# Patient Record
Sex: Female | Born: 1975 | Race: Black or African American | Marital: Married | Smoking: Never smoker
Health system: Northeastern US, Academic
[De-identification: ages and names within clinical notes are randomized; demographics above are authoritative.]

## PROBLEM LIST (undated history)

## (undated) DIAGNOSIS — T7840XA Allergy, unspecified, initial encounter: Secondary | ICD-10-CM

## (undated) DIAGNOSIS — F32A Depression, unspecified: Secondary | ICD-10-CM

## (undated) DIAGNOSIS — I319 Disease of pericardium, unspecified: Secondary | ICD-10-CM

## (undated) DIAGNOSIS — F329 Major depressive disorder, single episode, unspecified: Secondary | ICD-10-CM

## (undated) DIAGNOSIS — R011 Cardiac murmur, unspecified: Secondary | ICD-10-CM

## (undated) DIAGNOSIS — I517 Cardiomegaly: Secondary | ICD-10-CM

## (undated) DIAGNOSIS — B999 Unspecified infectious disease: Secondary | ICD-10-CM

## (undated) DIAGNOSIS — I10 Essential (primary) hypertension: Secondary | ICD-10-CM

## (undated) DIAGNOSIS — D649 Anemia, unspecified: Secondary | ICD-10-CM

## (undated) DIAGNOSIS — O24419 Gestational diabetes mellitus in pregnancy, unspecified control: Secondary | ICD-10-CM

## (undated) DIAGNOSIS — I509 Heart failure, unspecified: Secondary | ICD-10-CM

## (undated) DIAGNOSIS — D219 Benign neoplasm of connective and other soft tissue, unspecified: Secondary | ICD-10-CM

## (undated) DIAGNOSIS — R0600 Dyspnea, unspecified: Secondary | ICD-10-CM

## (undated) DIAGNOSIS — J849 Interstitial pulmonary disease, unspecified: Secondary | ICD-10-CM

## (undated) DIAGNOSIS — M329 Systemic lupus erythematosus, unspecified: Secondary | ICD-10-CM

## (undated) DIAGNOSIS — R87619 Unspecified abnormal cytological findings in specimens from cervix uteri: Secondary | ICD-10-CM

## (undated) DIAGNOSIS — I4891 Unspecified atrial fibrillation: Secondary | ICD-10-CM

## (undated) HISTORY — DX: Disease of pericardium, unspecified: I31.9

## (undated) HISTORY — DX: Interstitial pulmonary disease, unspecified: J84.9

## (undated) HISTORY — DX: Cardiomegaly: I51.7

## (undated) HISTORY — PX: THERAPEUTIC ABORTION: SHX798

## (undated) HISTORY — DX: Systemic lupus erythematosus, unspecified: M32.9

## (undated) HISTORY — PX: CARDIAC CATHETERIZATION: SHX172

## (undated) HISTORY — DX: Major depressive disorder, single episode, unspecified: F32.9

## (undated) HISTORY — DX: Anemia, unspecified: D64.9

## (undated) HISTORY — DX: Unspecified abnormal cytological findings in specimens from cervix uteri: R87.619

## (undated) HISTORY — DX: Essential (primary) hypertension: I10

## (undated) HISTORY — DX: Depression, unspecified: F32.A

## (undated) HISTORY — DX: Allergy, unspecified, initial encounter: T78.40XA

---

## 1997-08-13 DIAGNOSIS — IMO0002 Reserved for concepts with insufficient information to code with codable children: Secondary | ICD-10-CM

## 1997-08-13 DIAGNOSIS — M329 Systemic lupus erythematosus, unspecified: Secondary | ICD-10-CM

## 1997-08-13 HISTORY — DX: Systemic lupus erythematosus, unspecified: M32.9

## 1997-08-13 HISTORY — DX: Reserved for concepts with insufficient information to code with codable children: IMO0002

## 1998-05-27 ENCOUNTER — Inpatient Hospital Stay (HOSPITAL_COMMUNITY): Admission: RE | Admit: 1998-05-27 | Discharge: 1998-05-27 | Payer: Self-pay | Admitting: Obstetrics and Gynecology

## 1998-06-01 ENCOUNTER — Ambulatory Visit (HOSPITAL_COMMUNITY): Admission: RE | Admit: 1998-06-01 | Discharge: 1998-06-01 | Payer: Self-pay | Admitting: Obstetrics & Gynecology

## 1998-06-01 ENCOUNTER — Encounter: Admission: RE | Admit: 1998-06-01 | Discharge: 1998-08-30 | Payer: Self-pay | Admitting: Obstetrics & Gynecology

## 1998-06-01 ENCOUNTER — Encounter: Admission: RE | Admit: 1998-06-01 | Discharge: 1998-06-01 | Payer: Self-pay | Admitting: Obstetrics & Gynecology

## 1998-06-03 ENCOUNTER — Inpatient Hospital Stay (HOSPITAL_COMMUNITY): Admission: AD | Admit: 1998-06-03 | Discharge: 1998-06-03 | Payer: Self-pay | Admitting: *Deleted

## 1998-06-06 ENCOUNTER — Encounter (HOSPITAL_COMMUNITY): Admission: RE | Admit: 1998-06-06 | Discharge: 1998-06-09 | Payer: Self-pay | Admitting: Obstetrics & Gynecology

## 1998-06-08 ENCOUNTER — Encounter: Admission: RE | Admit: 1998-06-08 | Discharge: 1998-06-08 | Payer: Self-pay | Admitting: Obstetrics & Gynecology

## 1998-06-08 ENCOUNTER — Inpatient Hospital Stay (HOSPITAL_COMMUNITY): Admission: AD | Admit: 1998-06-08 | Discharge: 1998-06-11 | Payer: Self-pay | Admitting: Obstetrics

## 1999-02-17 ENCOUNTER — Encounter: Payer: Self-pay | Admitting: Cardiology

## 1999-02-17 ENCOUNTER — Other Ambulatory Visit: Payer: Self-pay | Admitting: Cardiology

## 2001-08-04 ENCOUNTER — Other Ambulatory Visit: Payer: Self-pay | Admitting: Cardiology

## 2004-01-26 DIAGNOSIS — D509 Iron deficiency anemia, unspecified: Secondary | ICD-10-CM | POA: Insufficient documentation

## 2004-01-26 DIAGNOSIS — M329 Systemic lupus erythematosus, unspecified: Secondary | ICD-10-CM | POA: Insufficient documentation

## 2004-01-26 DIAGNOSIS — I10 Essential (primary) hypertension: Secondary | ICD-10-CM | POA: Insufficient documentation

## 2007-01-07 ENCOUNTER — Emergency Department (HOSPITAL_COMMUNITY): Admission: EM | Admit: 2007-01-07 | Discharge: 2007-01-08 | Payer: Self-pay | Admitting: Emergency Medicine

## 2007-05-12 ENCOUNTER — Emergency Department (HOSPITAL_COMMUNITY): Admission: EM | Admit: 2007-05-12 | Discharge: 2007-05-12 | Payer: Self-pay | Admitting: Family Medicine

## 2007-11-19 ENCOUNTER — Encounter: Admission: RE | Admit: 2007-11-19 | Discharge: 2007-11-19 | Payer: Self-pay | Admitting: Family Medicine

## 2008-02-09 ENCOUNTER — Emergency Department (HOSPITAL_COMMUNITY): Admission: EM | Admit: 2008-02-09 | Discharge: 2008-02-09 | Payer: Self-pay | Admitting: Emergency Medicine

## 2008-03-08 ENCOUNTER — Ambulatory Visit (HOSPITAL_COMMUNITY): Admission: RE | Admit: 2008-03-08 | Discharge: 2008-03-08 | Payer: Self-pay | Admitting: Urology

## 2008-03-25 ENCOUNTER — Emergency Department (HOSPITAL_COMMUNITY): Admission: EM | Admit: 2008-03-25 | Discharge: 2008-03-25 | Payer: Self-pay | Admitting: Family Medicine

## 2008-04-09 ENCOUNTER — Encounter: Admission: RE | Admit: 2008-04-09 | Discharge: 2008-04-23 | Payer: Self-pay | Admitting: Neurosurgery

## 2008-05-31 ENCOUNTER — Emergency Department (HOSPITAL_COMMUNITY): Admission: EM | Admit: 2008-05-31 | Discharge: 2008-05-31 | Payer: Self-pay | Admitting: Family Medicine

## 2008-06-30 ENCOUNTER — Other Ambulatory Visit: Payer: Self-pay | Admitting: Family Medicine

## 2008-06-30 ENCOUNTER — Emergency Department (HOSPITAL_COMMUNITY): Admission: EM | Admit: 2008-06-30 | Discharge: 2008-06-30 | Payer: Self-pay | Admitting: Emergency Medicine

## 2008-08-18 ENCOUNTER — Observation Stay (HOSPITAL_COMMUNITY): Admission: EM | Admit: 2008-08-18 | Discharge: 2008-08-19 | Payer: Self-pay | Admitting: Emergency Medicine

## 2008-08-19 ENCOUNTER — Encounter (INDEPENDENT_AMBULATORY_CARE_PROVIDER_SITE_OTHER): Payer: Self-pay | Admitting: Emergency Medicine

## 2008-09-08 ENCOUNTER — Inpatient Hospital Stay (HOSPITAL_COMMUNITY): Admission: EM | Admit: 2008-09-08 | Discharge: 2008-09-10 | Payer: Self-pay | Admitting: Emergency Medicine

## 2008-10-14 ENCOUNTER — Emergency Department (HOSPITAL_COMMUNITY): Admission: EM | Admit: 2008-10-14 | Discharge: 2008-10-14 | Payer: Self-pay | Admitting: Family Medicine

## 2008-12-11 ENCOUNTER — Inpatient Hospital Stay (HOSPITAL_COMMUNITY): Admission: AD | Admit: 2008-12-11 | Discharge: 2008-12-11 | Payer: Self-pay | Admitting: Family Medicine

## 2008-12-22 ENCOUNTER — Observation Stay (HOSPITAL_COMMUNITY): Admission: AD | Admit: 2008-12-22 | Discharge: 2008-12-22 | Payer: Self-pay | Admitting: Family Medicine

## 2008-12-22 ENCOUNTER — Ambulatory Visit: Payer: Self-pay | Admitting: Family Medicine

## 2008-12-22 ENCOUNTER — Ambulatory Visit: Payer: Self-pay | Admitting: Obstetrics & Gynecology

## 2008-12-22 ENCOUNTER — Encounter: Payer: Self-pay | Admitting: Physician Assistant

## 2008-12-24 ENCOUNTER — Encounter (INDEPENDENT_AMBULATORY_CARE_PROVIDER_SITE_OTHER): Payer: Self-pay | Admitting: Family Medicine

## 2008-12-24 LAB — CONVERTED CEMR LAB
Creatinine 24 HR UR: 2204 mg/24hr — ABNORMAL HIGH (ref 700–1800)
Creatinine, Urine: 119.1 mg/dL
Protein, Ur: 56 mg/24hr (ref 50–100)

## 2009-01-06 ENCOUNTER — Encounter: Payer: Self-pay | Admitting: Physician Assistant

## 2009-01-06 ENCOUNTER — Encounter: Payer: Self-pay | Admitting: Obstetrics & Gynecology

## 2009-01-06 ENCOUNTER — Ambulatory Visit: Payer: Self-pay | Admitting: Obstetrics & Gynecology

## 2009-01-06 LAB — CONVERTED CEMR LAB
Antibody Screen: NEGATIVE
Basophils Absolute: 0 10*3/uL (ref 0.0–0.1)
Basophils Relative: 0 % (ref 0–1)
Eosinophils Absolute: 0.2 10*3/uL (ref 0.0–0.7)
Eosinophils Relative: 4 % (ref 0–5)
HCT: 31.6 % — ABNORMAL LOW (ref 36.0–46.0)
Hemoglobin: 10.3 g/dL — ABNORMAL LOW (ref 12.0–15.0)
Hepatitis B Surface Ag: NEGATIVE
Hgb A2 Quant: 3.7 % — ABNORMAL HIGH (ref 2.2–3.2)
Hgb A: 96.3 % — ABNORMAL LOW (ref 96.8–97.8)
Hgb F Quant: 0 % (ref 0.0–2.0)
Hgb S Quant: 0 % (ref 0.0–0.0)
Lymphocytes Relative: 20 % (ref 12–46)
Lymphs Abs: 1 10*3/uL (ref 0.7–4.0)
MCHC: 32.6 g/dL (ref 30.0–36.0)
MCV: 68.5 fL — ABNORMAL LOW (ref 78.0–100.0)
Monocytes Absolute: 0.5 10*3/uL (ref 0.1–1.0)
Monocytes Relative: 10 % (ref 3–12)
Neutro Abs: 3.5 10*3/uL (ref 1.7–7.7)
Neutrophils Relative %: 66 % (ref 43–77)
Platelets: 365 10*3/uL (ref 150–400)
RBC: 4.61 M/uL (ref 3.87–5.11)
RDW: 21.4 % — ABNORMAL HIGH (ref 11.5–15.5)
Rh Type: POSITIVE
Rubella: 113.8 intl units/mL — ABNORMAL HIGH
WBC: 5.3 10*3/uL (ref 4.0–10.5)

## 2009-01-28 ENCOUNTER — Ambulatory Visit: Payer: Self-pay | Admitting: Obstetrics & Gynecology

## 2009-01-28 ENCOUNTER — Encounter: Payer: Self-pay | Admitting: Family Medicine

## 2009-01-28 ENCOUNTER — Inpatient Hospital Stay (HOSPITAL_COMMUNITY): Admission: AD | Admit: 2009-01-28 | Discharge: 2009-01-29 | Payer: Self-pay | Admitting: Obstetrics & Gynecology

## 2009-01-29 ENCOUNTER — Encounter: Payer: Self-pay | Admitting: Obstetrics & Gynecology

## 2009-02-03 ENCOUNTER — Inpatient Hospital Stay (HOSPITAL_COMMUNITY): Admission: AD | Admit: 2009-02-03 | Discharge: 2009-02-03 | Payer: Self-pay | Admitting: Obstetrics & Gynecology

## 2009-02-16 ENCOUNTER — Ambulatory Visit: Payer: Self-pay | Admitting: Obstetrics and Gynecology

## 2009-04-26 ENCOUNTER — Emergency Department (HOSPITAL_COMMUNITY): Admission: EM | Admit: 2009-04-26 | Discharge: 2009-04-27 | Payer: Self-pay | Admitting: Emergency Medicine

## 2009-04-26 ENCOUNTER — Emergency Department (HOSPITAL_COMMUNITY): Admission: EM | Admit: 2009-04-26 | Discharge: 2009-04-26 | Payer: Self-pay | Admitting: Emergency Medicine

## 2009-06-04 ENCOUNTER — Emergency Department (HOSPITAL_COMMUNITY): Admission: EM | Admit: 2009-06-04 | Discharge: 2009-06-05 | Payer: Self-pay | Admitting: Emergency Medicine

## 2009-06-08 ENCOUNTER — Emergency Department (HOSPITAL_COMMUNITY): Admission: EM | Admit: 2009-06-08 | Discharge: 2009-06-08 | Payer: Self-pay | Admitting: Emergency Medicine

## 2009-08-13 ENCOUNTER — Emergency Department (HOSPITAL_COMMUNITY): Admission: EM | Admit: 2009-08-13 | Discharge: 2009-08-13 | Payer: Self-pay | Admitting: Emergency Medicine

## 2009-10-10 ENCOUNTER — Inpatient Hospital Stay (HOSPITAL_COMMUNITY): Admission: AD | Admit: 2009-10-10 | Discharge: 2009-10-10 | Payer: Self-pay | Admitting: Obstetrics & Gynecology

## 2010-01-02 ENCOUNTER — Emergency Department (HOSPITAL_COMMUNITY): Admission: EM | Admit: 2010-01-02 | Discharge: 2010-01-02 | Payer: Self-pay | Admitting: Emergency Medicine

## 2010-02-19 ENCOUNTER — Emergency Department (HOSPITAL_COMMUNITY): Admission: EM | Admit: 2010-02-19 | Discharge: 2010-02-19 | Payer: Self-pay | Admitting: Emergency Medicine

## 2010-03-06 ENCOUNTER — Emergency Department (HOSPITAL_COMMUNITY): Admission: EM | Admit: 2010-03-06 | Discharge: 2010-03-06 | Payer: Self-pay | Admitting: Emergency Medicine

## 2010-05-22 ENCOUNTER — Inpatient Hospital Stay (HOSPITAL_COMMUNITY): Admission: AD | Admit: 2010-05-22 | Discharge: 2010-05-23 | Payer: Self-pay | Admitting: Family Medicine

## 2010-05-22 ENCOUNTER — Ambulatory Visit: Payer: Self-pay | Admitting: Family

## 2010-06-29 ENCOUNTER — Emergency Department (HOSPITAL_COMMUNITY)
Admission: EM | Admit: 2010-06-29 | Discharge: 2010-06-29 | Payer: Self-pay | Source: Home / Self Care | Admitting: Emergency Medicine

## 2010-08-09 ENCOUNTER — Emergency Department (HOSPITAL_COMMUNITY)
Admission: EM | Admit: 2010-08-09 | Discharge: 2010-08-09 | Payer: Self-pay | Source: Home / Self Care | Admitting: Family Medicine

## 2010-09-04 ENCOUNTER — Encounter: Payer: Self-pay | Admitting: *Deleted

## 2010-10-04 ENCOUNTER — Inpatient Hospital Stay (HOSPITAL_COMMUNITY)
Admission: AD | Admit: 2010-10-04 | Discharge: 2010-10-04 | Disposition: A | Discharge status: Home / Self Care | Payer: BLUE CROSS/BLUE SHIELD | Source: Ambulatory Visit | Attending: Obstetrics & Gynecology | Admitting: Obstetrics & Gynecology

## 2010-10-04 DIAGNOSIS — N390 Urinary tract infection, site not specified: Secondary | ICD-10-CM | POA: Insufficient documentation

## 2010-10-04 DIAGNOSIS — O2390 Unspecified genitourinary tract infection in pregnancy, unspecified trimester: Secondary | ICD-10-CM

## 2010-10-04 DIAGNOSIS — M545 Low back pain, unspecified: Secondary | ICD-10-CM

## 2010-10-04 LAB — URINE MICROSCOPIC-ADD ON

## 2010-10-04 LAB — URINALYSIS, ROUTINE W REFLEX MICROSCOPIC
Bilirubin Urine: NEGATIVE
Hgb urine dipstick: NEGATIVE
Ketones, ur: NEGATIVE mg/dL
Nitrite: NEGATIVE
Protein, ur: NEGATIVE mg/dL
Specific Gravity, Urine: 1.025 (ref 1.005–1.030)
Urine Glucose, Fasting: NEGATIVE mg/dL
Urobilinogen, UA: 1 mg/dL (ref 0.0–1.0)
pH: 6.5 (ref 5.0–8.0)

## 2010-10-04 LAB — POCT PREGNANCY, URINE: Preg Test, Ur: POSITIVE

## 2010-10-05 LAB — URINE CULTURE
Colony Count: 30000
Culture  Setup Time: 201202230131

## 2010-10-14 ENCOUNTER — Inpatient Hospital Stay (HOSPITAL_COMMUNITY)
Admission: AD | Admit: 2010-10-14 | Discharge: 2010-10-14 | Disposition: A | Discharge status: Home / Self Care | Payer: BLUE CROSS/BLUE SHIELD | Source: Ambulatory Visit | Attending: Family Medicine | Admitting: Family Medicine

## 2010-10-14 ENCOUNTER — Inpatient Hospital Stay (HOSPITAL_COMMUNITY): Payer: BLUE CROSS/BLUE SHIELD

## 2010-10-14 DIAGNOSIS — O209 Hemorrhage in early pregnancy, unspecified: Secondary | ICD-10-CM | POA: Insufficient documentation

## 2010-10-14 LAB — URINE MICROSCOPIC-ADD ON

## 2010-10-14 LAB — WET PREP, GENITAL
Trich, Wet Prep: NONE SEEN
Yeast Wet Prep HPF POC: NONE SEEN

## 2010-10-14 LAB — BASIC METABOLIC PANEL
BUN: 2 mg/dL — ABNORMAL LOW (ref 6–23)
CO2: 21 mEq/L (ref 19–32)
Calcium: 9.1 mg/dL (ref 8.4–10.5)
Chloride: 107 mEq/L (ref 96–112)
Creatinine, Ser: 0.52 mg/dL (ref 0.4–1.2)
GFR calc Af Amer: >60 mL/min (ref >60)
GFR calc non Af Amer: >60 mL/min (ref >60)
Glucose, Bld: 101 mg/dL — ABNORMAL HIGH (ref 70–99)
Potassium: 3.5 mEq/L (ref 3.5–5.1)
Sodium: 135 mEq/L (ref 135–145)

## 2010-10-14 LAB — URINALYSIS, ROUTINE W REFLEX MICROSCOPIC
Bilirubin Urine: NEGATIVE
Glucose, UA: NEGATIVE mg/dL
Ketones, ur: NEGATIVE mg/dL
Nitrite: NEGATIVE
Protein, ur: NEGATIVE mg/dL
Specific Gravity, Urine: 1.02 (ref 1.005–1.030)
Urobilinogen, UA: 0.2 mg/dL (ref 0.0–1.0)
pH: 6.5 (ref 5.0–8.0)

## 2010-10-14 LAB — HCG, QUANTITATIVE, PREGNANCY: hCG, Beta Chain, Quant, S: 40164 m[IU]/mL — ABNORMAL HIGH (ref <5)

## 2010-10-16 LAB — GC/CHLAMYDIA PROBE AMP, GENITAL
Chlamydia, DNA Probe: NEGATIVE
GC Probe Amp, Genital: NEGATIVE

## 2010-10-18 ENCOUNTER — Encounter: Payer: Self-pay | Admitting: Family

## 2010-10-18 ENCOUNTER — Other Ambulatory Visit: Payer: Self-pay | Admitting: Family Medicine

## 2010-10-18 ENCOUNTER — Other Ambulatory Visit: Payer: Self-pay

## 2010-10-18 ENCOUNTER — Encounter: Payer: Self-pay | Admitting: Physician Assistant

## 2010-10-18 DIAGNOSIS — M359 Systemic involvement of connective tissue, unspecified: Secondary | ICD-10-CM

## 2010-10-18 DIAGNOSIS — IMO0002 Reserved for concepts with insufficient information to code with codable children: Secondary | ICD-10-CM

## 2010-10-18 DIAGNOSIS — O09299 Supervision of pregnancy with other poor reproductive or obstetric history, unspecified trimester: Secondary | ICD-10-CM

## 2010-10-18 DIAGNOSIS — O169 Unspecified maternal hypertension, unspecified trimester: Secondary | ICD-10-CM

## 2010-10-18 LAB — POCT URINALYSIS DIPSTICK
Bilirubin Urine: NEGATIVE
Glucose, UA: NEGATIVE mg/dL
Hgb urine dipstick: NEGATIVE
Ketones, ur: NEGATIVE mg/dL
Nitrite: NEGATIVE
Protein, ur: 30 mg/dL — AB
Specific Gravity, Urine: 1.02 (ref 1.005–1.030)
Urobilinogen, UA: 0.2 mg/dL (ref 0.0–1.0)
pH: 7 (ref 5.0–8.0)

## 2010-10-18 LAB — CONVERTED CEMR LAB
Antibody Screen: NEGATIVE
Basophils Absolute: 0 10*3/uL (ref 0.0–0.1)
Basophils Relative: 0 % (ref 0–1)
Eosinophils Absolute: 0.1 10*3/uL (ref 0.0–0.7)
Eosinophils Relative: 3 % (ref 0–5)
HCT: 31 % — ABNORMAL LOW (ref 36.0–46.0)
HIV: NONREACTIVE
Hemoglobin: 9.8 g/dL — ABNORMAL LOW (ref 12.0–15.0)
Hepatitis B Surface Ag: NEGATIVE
Lymphocytes Relative: 21 % (ref 12–46)
Lymphs Abs: 0.8 10*3/uL (ref 0.7–4.0)
MCHC: 31.6 g/dL (ref 30.0–36.0)
MCV: 70.6 fL — ABNORMAL LOW (ref 78.0–100.0)
Monocytes Absolute: 0.4 10*3/uL (ref 0.1–1.0)
Monocytes Relative: 10 % (ref 3–12)
Neutro Abs: 2.6 10*3/uL (ref 1.7–7.7)
Neutrophils Relative %: 66 % (ref 43–77)
Platelets: 325 10*3/uL (ref 150–400)
RBC: 4.39 M/uL (ref 3.87–5.11)
RDW: 17.2 % — ABNORMAL HIGH (ref 11.5–15.5)
Rh Type: POSITIVE
Rubella: 105.2 intl units/mL — ABNORMAL HIGH
WBC: 4 10*3/uL (ref 4.0–10.5)

## 2010-10-23 ENCOUNTER — Other Ambulatory Visit: Payer: Self-pay

## 2010-10-24 LAB — CBC
HCT: 33.7 % — ABNORMAL LOW (ref 36.0–46.0)
Hemoglobin: 11.2 g/dL — ABNORMAL LOW (ref 12.0–15.0)
MCH: 24 pg — ABNORMAL LOW (ref 26.0–34.0)
MCHC: 33.2 g/dL (ref 30.0–36.0)
MCV: 72.3 fL — ABNORMAL LOW (ref 78.0–100.0)
Platelets: 293 10*3/uL (ref 150–400)
RBC: 4.66 MIL/uL (ref 3.87–5.11)
RDW: 17.1 % — ABNORMAL HIGH (ref 11.5–15.5)
WBC: 2.7 10*3/uL — ABNORMAL LOW (ref 4.0–10.5)

## 2010-10-24 LAB — URINALYSIS, ROUTINE W REFLEX MICROSCOPIC
Bilirubin Urine: NEGATIVE
Glucose, UA: NEGATIVE mg/dL
Ketones, ur: NEGATIVE mg/dL
Leukocytes, UA: NEGATIVE
Nitrite: NEGATIVE
Protein, ur: NEGATIVE mg/dL
Specific Gravity, Urine: 1.014 (ref 1.005–1.030)
Urobilinogen, UA: 0.2 mg/dL (ref 0.0–1.0)
pH: 7 (ref 5.0–8.0)

## 2010-10-24 LAB — POCT CARDIAC MARKERS
CKMB, poc: <1 ng/mL — ABNORMAL LOW (ref 1.0–8.0)
CKMB, poc: <1 ng/mL — ABNORMAL LOW (ref 1.0–8.0)
Myoglobin, poc: 27.5 ng/mL (ref 12–200)
Myoglobin, poc: 36.1 ng/mL (ref 12–200)
Troponin i, poc: <0.05 ng/mL (ref 0.00–0.09)
Troponin i, poc: <0.05 ng/mL (ref 0.00–0.09)

## 2010-10-24 LAB — DIFFERENTIAL
Basophils Absolute: 0 10*3/uL (ref 0.0–0.1)
Basophils Relative: 1 % (ref 0–1)
Eosinophils Absolute: 0.1 10*3/uL (ref 0.0–0.7)
Eosinophils Relative: 5 % (ref 0–5)
Lymphocytes Relative: 34 % (ref 12–46)
Lymphs Abs: 0.9 10*3/uL (ref 0.7–4.0)
Monocytes Absolute: 0.4 10*3/uL (ref 0.1–1.0)
Monocytes Relative: 16 % — ABNORMAL HIGH (ref 3–12)
Neutro Abs: 1.2 10*3/uL — ABNORMAL LOW (ref 1.7–7.7)
Neutrophils Relative %: 44 % (ref 43–77)

## 2010-10-24 LAB — POCT I-STAT, CHEM 8
BUN: 5 mg/dL — ABNORMAL LOW (ref 6–23)
Calcium, Ion: 1.17 mmol/L (ref 1.12–1.32)
Chloride: 105 mEq/L (ref 96–112)
Creatinine, Ser: 0.6 mg/dL (ref 0.4–1.2)
Glucose, Bld: 111 mg/dL — ABNORMAL HIGH (ref 70–99)
HCT: 37 % (ref 36.0–46.0)
Hemoglobin: 12.6 g/dL (ref 12.0–15.0)
Potassium: 3.4 mEq/L — ABNORMAL LOW (ref 3.5–5.1)
Sodium: 141 mEq/L (ref 135–145)
TCO2: 25 mmol/L (ref 0–100)

## 2010-10-24 LAB — POCT PREGNANCY, URINE: Preg Test, Ur: NEGATIVE

## 2010-10-24 LAB — URINE MICROSCOPIC-ADD ON

## 2010-10-26 LAB — URINALYSIS, ROUTINE W REFLEX MICROSCOPIC
Bilirubin Urine: NEGATIVE
Glucose, UA: NEGATIVE mg/dL
Hgb urine dipstick: NEGATIVE
Ketones, ur: NEGATIVE mg/dL
Nitrite: NEGATIVE
Protein, ur: NEGATIVE mg/dL
Specific Gravity, Urine: 1.025 (ref 1.005–1.030)
Urobilinogen, UA: 0.2 mg/dL (ref 0.0–1.0)
pH: 6 (ref 5.0–8.0)

## 2010-10-26 LAB — CBC
HCT: 33.6 % — ABNORMAL LOW (ref 36.0–46.0)
Hemoglobin: 10.9 g/dL — ABNORMAL LOW (ref 12.0–15.0)
MCH: 24.6 pg — ABNORMAL LOW (ref 26.0–34.0)
MCHC: 32.5 g/dL (ref 30.0–36.0)
MCV: 75.8 fL — ABNORMAL LOW (ref 78.0–100.0)
Platelets: 236 10*3/uL (ref 150–400)
RBC: 4.43 MIL/uL (ref 3.87–5.11)
RDW: 17.7 % — ABNORMAL HIGH (ref 11.5–15.5)
WBC: 3.8 10*3/uL — ABNORMAL LOW (ref 4.0–10.5)

## 2010-10-26 LAB — COMPREHENSIVE METABOLIC PANEL
ALT: 12 U/L (ref 0–35)
AST: 16 U/L (ref 0–37)
Albumin: 3 g/dL — ABNORMAL LOW (ref 3.5–5.2)
Alkaline Phosphatase: 48 U/L (ref 39–117)
BUN: 9 mg/dL (ref 6–23)
CO2: 28 mEq/L (ref 19–32)
Calcium: 9.1 mg/dL (ref 8.4–10.5)
Chloride: 102 mEq/L (ref 96–112)
Creatinine, Ser: 0.65 mg/dL (ref 0.4–1.2)
GFR calc Af Amer: >60 mL/min (ref >60)
GFR calc non Af Amer: >60 mL/min (ref >60)
Glucose, Bld: 97 mg/dL (ref 70–99)
Potassium: 2.9 mEq/L — ABNORMAL LOW (ref 3.5–5.1)
Sodium: 135 mEq/L (ref 135–145)
Total Bilirubin: 0.4 mg/dL (ref 0.3–1.2)
Total Protein: 7.1 g/dL (ref 6.0–8.3)

## 2010-10-26 LAB — POCT PREGNANCY, URINE: Preg Test, Ur: NEGATIVE

## 2010-10-28 LAB — CBC
HCT: 34.4 % — ABNORMAL LOW (ref 36.0–46.0)
Hemoglobin: 11.4 g/dL — ABNORMAL LOW (ref 12.0–15.0)
MCH: 24.5 pg — ABNORMAL LOW (ref 26.0–34.0)
MCHC: 33 g/dL (ref 30.0–36.0)
MCV: 74.2 fL — ABNORMAL LOW (ref 78.0–100.0)
Platelets: 300 10*3/uL (ref 150–400)
RBC: 4.64 MIL/uL (ref 3.87–5.11)
RDW: 19.9 % — ABNORMAL HIGH (ref 11.5–15.5)
WBC: 4.2 10*3/uL (ref 4.0–10.5)

## 2010-10-28 LAB — COMPREHENSIVE METABOLIC PANEL
ALT: 14 U/L (ref 0–35)
AST: 16 U/L (ref 0–37)
Albumin: 3.2 g/dL — ABNORMAL LOW (ref 3.5–5.2)
Alkaline Phosphatase: 60 U/L (ref 39–117)
BUN: 4 mg/dL — ABNORMAL LOW (ref 6–23)
CO2: 24 mEq/L (ref 19–32)
Calcium: 8.9 mg/dL (ref 8.4–10.5)
Chloride: 106 mEq/L (ref 96–112)
Creatinine, Ser: 0.54 mg/dL (ref 0.4–1.2)
GFR calc Af Amer: >60 mL/min (ref >60)
GFR calc non Af Amer: >60 mL/min (ref >60)
Glucose, Bld: 125 mg/dL — ABNORMAL HIGH (ref 70–99)
Potassium: 3.7 mEq/L (ref 3.5–5.1)
Sodium: 137 mEq/L (ref 135–145)
Total Bilirubin: 0.3 mg/dL (ref 0.3–1.2)
Total Protein: 7.5 g/dL (ref 6.0–8.3)

## 2010-10-28 LAB — DIFFERENTIAL
Basophils Absolute: 0 10*3/uL (ref 0.0–0.1)
Basophils Relative: 0 % (ref 0–1)
Eosinophils Absolute: 0.2 10*3/uL (ref 0.0–0.7)
Eosinophils Relative: 4 % (ref 0–5)
Lymphocytes Relative: 21 % (ref 12–46)
Lymphs Abs: 0.9 10*3/uL (ref 0.7–4.0)
Monocytes Absolute: 0.4 10*3/uL (ref 0.1–1.0)
Monocytes Relative: 9 % (ref 3–12)
Neutro Abs: 2.7 10*3/uL (ref 1.7–7.7)
Neutrophils Relative %: 66 % (ref 43–77)

## 2010-10-28 LAB — POCT CARDIAC MARKERS
CKMB, poc: 1 ng/mL (ref 1.0–8.0)
Myoglobin, poc: 38.4 ng/mL (ref 12–200)
Troponin i, poc: <0.05 ng/mL (ref 0.00–0.09)

## 2010-10-28 LAB — D-DIMER, QUANTITATIVE: D-Dimer, Quant: <0.22 ug/mL-FEU (ref 0.00–0.48)

## 2010-10-29 LAB — BASIC METABOLIC PANEL
BUN: 9 mg/dL (ref 6–23)
CO2: 25 mEq/L (ref 19–32)
Calcium: 9.4 mg/dL (ref 8.4–10.5)
Chloride: 108 mEq/L (ref 96–112)
Creatinine, Ser: 0.69 mg/dL (ref 0.4–1.2)
GFR calc Af Amer: >60 mL/min (ref >60)
GFR calc non Af Amer: >60 mL/min (ref >60)
Glucose, Bld: 97 mg/dL (ref 70–99)
Potassium: 3.1 mEq/L — ABNORMAL LOW (ref 3.5–5.1)
Sodium: 140 mEq/L (ref 135–145)

## 2010-10-29 LAB — POCT PREGNANCY, URINE: Preg Test, Ur: NEGATIVE

## 2010-10-29 LAB — CBC
HCT: 34.4 % — ABNORMAL LOW (ref 36.0–46.0)
Hemoglobin: 11.1 g/dL — ABNORMAL LOW (ref 12.0–15.0)
MCH: 23.7 pg — ABNORMAL LOW (ref 26.0–34.0)
MCHC: 32.2 g/dL (ref 30.0–36.0)
MCV: 73.7 fL — ABNORMAL LOW (ref 78.0–100.0)
Platelets: 302 10*3/uL (ref 150–400)
RBC: 4.67 MIL/uL (ref 3.87–5.11)
RDW: 20.9 % — ABNORMAL HIGH (ref 11.5–15.5)
WBC: 4.3 10*3/uL (ref 4.0–10.5)

## 2010-10-29 LAB — DIFFERENTIAL
Basophils Absolute: 0 10*3/uL (ref 0.0–0.1)
Basophils Relative: 1 % (ref 0–1)
Eosinophils Absolute: 0.1 10*3/uL (ref 0.0–0.7)
Eosinophils Relative: 3 % (ref 0–5)
Lymphocytes Relative: 30 % (ref 12–46)
Lymphs Abs: 1.3 10*3/uL (ref 0.7–4.0)
Monocytes Absolute: 0.4 10*3/uL (ref 0.1–1.0)
Monocytes Relative: 10 % (ref 3–12)
Neutro Abs: 2.4 10*3/uL (ref 1.7–7.7)
Neutrophils Relative %: 57 % (ref 43–77)

## 2010-10-29 LAB — URINALYSIS, ROUTINE W REFLEX MICROSCOPIC
Bilirubin Urine: NEGATIVE
Glucose, UA: NEGATIVE mg/dL
Hgb urine dipstick: NEGATIVE
Ketones, ur: NEGATIVE mg/dL
Nitrite: NEGATIVE
Protein, ur: NEGATIVE mg/dL
Specific Gravity, Urine: 1.023 (ref 1.005–1.030)
Urobilinogen, UA: 1 mg/dL (ref 0.0–1.0)
pH: 6.5 (ref 5.0–8.0)

## 2010-10-29 LAB — POCT CARDIAC MARKERS
CKMB, poc: <1 ng/mL — ABNORMAL LOW (ref 1.0–8.0)
Myoglobin, poc: 33.6 ng/mL (ref 12–200)
Troponin i, poc: <0.05 ng/mL (ref 0.00–0.09)

## 2010-10-30 LAB — POCT URINALYSIS DIP (DEVICE)
Bilirubin Urine: NEGATIVE
Glucose, UA: NEGATIVE mg/dL
Hgb urine dipstick: NEGATIVE
Ketones, ur: NEGATIVE mg/dL
Nitrite: NEGATIVE
Protein, ur: NEGATIVE mg/dL
Specific Gravity, Urine: 1.025 (ref 1.005–1.030)
Urobilinogen, UA: 0.2 mg/dL (ref 0.0–1.0)
pH: 6.5 (ref 5.0–8.0)

## 2010-10-30 LAB — URINE CULTURE: Colony Count: >=100000

## 2010-10-30 LAB — POCT PREGNANCY, URINE: Preg Test, Ur: NEGATIVE

## 2010-11-01 LAB — WET PREP, GENITAL
Trich, Wet Prep: NONE SEEN
Yeast Wet Prep HPF POC: NONE SEEN

## 2010-11-01 LAB — CBC
HCT: 32.8 % — ABNORMAL LOW (ref 36.0–46.0)
Hemoglobin: 10.5 g/dL — ABNORMAL LOW (ref 12.0–15.0)
MCHC: 32 g/dL (ref 30.0–36.0)
MCV: 77.7 fL — ABNORMAL LOW (ref 78.0–100.0)
Platelets: 322 10*3/uL (ref 150–400)
RBC: 4.23 MIL/uL (ref 3.87–5.11)
RDW: 15.1 % (ref 11.5–15.5)
WBC: 3.3 10*3/uL — ABNORMAL LOW (ref 4.0–10.5)

## 2010-11-01 LAB — GC/CHLAMYDIA PROBE AMP, GENITAL
Chlamydia, DNA Probe: NEGATIVE
GC Probe Amp, Genital: NEGATIVE

## 2010-11-15 ENCOUNTER — Other Ambulatory Visit: Payer: Self-pay | Admitting: Obstetrics and Gynecology

## 2010-11-15 DIAGNOSIS — Z331 Pregnant state, incidental: Secondary | ICD-10-CM

## 2010-11-15 DIAGNOSIS — O10019 Pre-existing essential hypertension complicating pregnancy, unspecified trimester: Secondary | ICD-10-CM

## 2010-11-15 LAB — POCT URINALYSIS DIP (DEVICE)
Glucose, UA: 100 mg/dL — AB
Hgb urine dipstick: NEGATIVE
Ketones, ur: NEGATIVE mg/dL
Nitrite: NEGATIVE
Protein, ur: NEGATIVE mg/dL
Specific Gravity, Urine: 1.025 (ref 1.005–1.030)
Urobilinogen, UA: 2 mg/dL — ABNORMAL HIGH (ref 0.0–1.0)
pH: 6 (ref 5.0–8.0)

## 2010-11-16 LAB — DIFFERENTIAL
Basophils Absolute: 0 10*3/uL (ref 0.0–0.1)
Basophils Absolute: 0.1 10*3/uL (ref 0.0–0.1)
Basophils Relative: 0 % (ref 0–1)
Basophils Relative: 1 % (ref 0–1)
Eosinophils Absolute: 0.1 10*3/uL (ref 0.0–0.7)
Eosinophils Absolute: 0.1 10*3/uL (ref 0.0–0.7)
Eosinophils Relative: 2 % (ref 0–5)
Eosinophils Relative: 4 % (ref 0–5)
Lymphocytes Relative: 21 % (ref 12–46)
Lymphocytes Relative: 32 % (ref 12–46)
Lymphs Abs: 1.1 10*3/uL (ref 0.7–4.0)
Lymphs Abs: 1.1 10*3/uL (ref 0.7–4.0)
Monocytes Absolute: 0.4 10*3/uL (ref 0.1–1.0)
Monocytes Absolute: 0.5 10*3/uL (ref 0.1–1.0)
Monocytes Relative: 10 % (ref 3–12)
Monocytes Relative: 12 % (ref 3–12)
Neutro Abs: 1.7 10*3/uL (ref 1.7–7.7)
Neutro Abs: 3.3 10*3/uL (ref 1.7–7.7)
Neutrophils Relative %: 52 % (ref 43–77)
Neutrophils Relative %: 66 % (ref 43–77)

## 2010-11-16 LAB — COMPREHENSIVE METABOLIC PANEL
ALT: 20 U/L (ref 0–35)
AST: 23 U/L (ref 0–37)
Albumin: 3.4 g/dL — ABNORMAL LOW (ref 3.5–5.2)
Alkaline Phosphatase: 56 U/L (ref 39–117)
BUN: 6 mg/dL (ref 6–23)
CO2: 22 mEq/L (ref 19–32)
Calcium: 9 mg/dL (ref 8.4–10.5)
Chloride: 107 mEq/L (ref 96–112)
Creatinine, Ser: 0.56 mg/dL (ref 0.4–1.2)
GFR calc Af Amer: >60 mL/min (ref >60)
GFR calc non Af Amer: >60 mL/min (ref >60)
Glucose, Bld: 108 mg/dL — ABNORMAL HIGH (ref 70–99)
Potassium: 3.3 mEq/L — ABNORMAL LOW (ref 3.5–5.1)
Sodium: 137 mEq/L (ref 135–145)
Total Bilirubin: 0.6 mg/dL (ref 0.3–1.2)
Total Protein: 7.6 g/dL (ref 6.0–8.3)

## 2010-11-16 LAB — URINALYSIS, ROUTINE W REFLEX MICROSCOPIC
Bilirubin Urine: NEGATIVE
Bilirubin Urine: NEGATIVE
Glucose, UA: NEGATIVE mg/dL
Glucose, UA: NEGATIVE mg/dL
Hgb urine dipstick: NEGATIVE
Hgb urine dipstick: NEGATIVE
Ketones, ur: NEGATIVE mg/dL
Ketones, ur: NEGATIVE mg/dL
Nitrite: NEGATIVE
Nitrite: NEGATIVE
Protein, ur: 30 mg/dL — AB
Protein, ur: NEGATIVE mg/dL
Specific Gravity, Urine: 1.013 (ref 1.005–1.030)
Specific Gravity, Urine: 1.022 (ref 1.005–1.030)
Urobilinogen, UA: 0.2 mg/dL (ref 0.0–1.0)
Urobilinogen, UA: 1 mg/dL (ref 0.0–1.0)
pH: 6 (ref 5.0–8.0)
pH: 6.5 (ref 5.0–8.0)

## 2010-11-16 LAB — D-DIMER, QUANTITATIVE (NOT AT ARMC): D-Dimer, Quant: <0.22 ug/mL-FEU (ref 0.00–0.48)

## 2010-11-16 LAB — POCT PREGNANCY, URINE: Preg Test, Ur: NEGATIVE

## 2010-11-16 LAB — POCT CARDIAC MARKERS
CKMB, poc: <1 ng/mL — ABNORMAL LOW (ref 1.0–8.0)
CKMB, poc: <1 ng/mL — ABNORMAL LOW (ref 1.0–8.0)
Myoglobin, poc: 43.7 ng/mL (ref 12–200)
Myoglobin, poc: 64.7 ng/mL (ref 12–200)
Troponin i, poc: <0.05 ng/mL (ref 0.00–0.09)
Troponin i, poc: <0.05 ng/mL (ref 0.00–0.09)

## 2010-11-16 LAB — BASIC METABOLIC PANEL
BUN: 10 mg/dL (ref 6–23)
CO2: 24 mEq/L (ref 19–32)
Calcium: 9.2 mg/dL (ref 8.4–10.5)
Chloride: 105 mEq/L (ref 96–112)
Creatinine, Ser: 0.54 mg/dL (ref 0.4–1.2)
GFR calc Af Amer: >60 mL/min (ref >60)
GFR calc non Af Amer: >60 mL/min (ref >60)
Glucose, Bld: 99 mg/dL (ref 70–99)
Potassium: 3.7 mEq/L (ref 3.5–5.1)
Sodium: 138 mEq/L (ref 135–145)

## 2010-11-16 LAB — CBC
HCT: 35.2 % — ABNORMAL LOW (ref 36.0–46.0)
HCT: 36.7 % (ref 36.0–46.0)
Hemoglobin: 11.4 g/dL — ABNORMAL LOW (ref 12.0–15.0)
Hemoglobin: 12.1 g/dL (ref 12.0–15.0)
MCHC: 32.5 g/dL (ref 30.0–36.0)
MCHC: 33 g/dL (ref 30.0–36.0)
MCV: 72.6 fL — ABNORMAL LOW (ref 78.0–100.0)
MCV: 72.7 fL — ABNORMAL LOW (ref 78.0–100.0)
Platelets: 279 10*3/uL (ref 150–400)
Platelets: 311 10*3/uL (ref 150–400)
RBC: 4.84 MIL/uL (ref 3.87–5.11)
RBC: 5.05 MIL/uL (ref 3.87–5.11)
RDW: 21.5 % — ABNORMAL HIGH (ref 11.5–15.5)
RDW: 21.8 % — ABNORMAL HIGH (ref 11.5–15.5)
WBC: 3.3 10*3/uL — ABNORMAL LOW (ref 4.0–10.5)
WBC: 5.1 10*3/uL (ref 4.0–10.5)

## 2010-11-16 LAB — URINE CULTURE: Colony Count: >=100000

## 2010-11-16 LAB — PROTIME-INR
INR: 1 (ref 0.00–1.49)
Prothrombin Time: 13.1 seconds (ref 11.6–15.2)

## 2010-11-16 LAB — SEDIMENTATION RATE: Sed Rate: 32 mm/hr — ABNORMAL HIGH (ref 0–22)

## 2010-11-16 LAB — APTT: aPTT: 27 seconds (ref 24–37)

## 2010-11-16 LAB — URINE MICROSCOPIC-ADD ON

## 2010-11-17 LAB — POCT URINALYSIS DIP (DEVICE)
Bilirubin Urine: NEGATIVE
Glucose, UA: NEGATIVE mg/dL
Hgb urine dipstick: NEGATIVE
Ketones, ur: NEGATIVE mg/dL
Nitrite: NEGATIVE
Protein, ur: NEGATIVE mg/dL
Specific Gravity, Urine: 1.015 (ref 1.005–1.030)
Urobilinogen, UA: 0.2 mg/dL (ref 0.0–1.0)
pH: 6 (ref 5.0–8.0)

## 2010-11-20 LAB — URINALYSIS, ROUTINE W REFLEX MICROSCOPIC
Bilirubin Urine: NEGATIVE
Glucose, UA: NEGATIVE mg/dL
Ketones, ur: NEGATIVE mg/dL
Nitrite: NEGATIVE
Protein, ur: NEGATIVE mg/dL
Specific Gravity, Urine: <1.005 — ABNORMAL LOW (ref 1.005–1.030)
Urobilinogen, UA: 0.2 mg/dL (ref 0.0–1.0)
pH: 6 (ref 5.0–8.0)

## 2010-11-20 LAB — DIFFERENTIAL
Basophils Absolute: 0 10*3/uL (ref 0.0–0.1)
Basophils Relative: 0 % (ref 0–1)
Eosinophils Absolute: 0.3 10*3/uL (ref 0.0–0.7)
Eosinophils Relative: 3 % (ref 0–5)
Lymphocytes Relative: 10 % — ABNORMAL LOW (ref 12–46)
Lymphs Abs: 1 10*3/uL (ref 0.7–4.0)
Monocytes Absolute: 0.9 10*3/uL (ref 0.1–1.0)
Monocytes Relative: 9 % (ref 3–12)
Neutro Abs: 7.4 10*3/uL (ref 1.7–7.7)
Neutrophils Relative %: 78 % — ABNORMAL HIGH (ref 43–77)

## 2010-11-20 LAB — CBC
HCT: 27.9 % — ABNORMAL LOW (ref 36.0–46.0)
HCT: 30.3 % — ABNORMAL LOW (ref 36.0–46.0)
Hemoglobin: 9.3 g/dL — ABNORMAL LOW (ref 12.0–15.0)
Hemoglobin: 9.9 g/dL — ABNORMAL LOW (ref 12.0–15.0)
MCHC: 32.5 g/dL (ref 30.0–36.0)
MCHC: 33.1 g/dL (ref 30.0–36.0)
MCV: 73 fL — ABNORMAL LOW (ref 78.0–100.0)
MCV: 73.5 fL — ABNORMAL LOW (ref 78.0–100.0)
Platelets: 326 10*3/uL (ref 150–400)
Platelets: 444 10*3/uL — ABNORMAL HIGH (ref 150–400)
RBC: 3.82 MIL/uL — ABNORMAL LOW (ref 3.87–5.11)
RBC: 4.13 MIL/uL (ref 3.87–5.11)
RDW: 21.4 % — ABNORMAL HIGH (ref 11.5–15.5)
RDW: 21.6 % — ABNORMAL HIGH (ref 11.5–15.5)
WBC: 8.8 10*3/uL (ref 4.0–10.5)
WBC: 9.6 10*3/uL (ref 4.0–10.5)

## 2010-11-20 LAB — URINE MICROSCOPIC-ADD ON

## 2010-11-21 LAB — CBC
HCT: 28.9 % — ABNORMAL LOW (ref 36.0–46.0)
HCT: 29.7 % — ABNORMAL LOW (ref 36.0–46.0)
Hemoglobin: 9.7 g/dL — ABNORMAL LOW (ref 12.0–15.0)
Hemoglobin: 9.7 g/dL — ABNORMAL LOW (ref 12.0–15.0)
MCHC: 32.7 g/dL (ref 30.0–36.0)
MCHC: 33.5 g/dL (ref 30.0–36.0)
MCV: 69.8 fL — ABNORMAL LOW (ref 78.0–100.0)
MCV: 70.4 fL — ABNORMAL LOW (ref 78.0–100.0)
Platelets: 348 10*3/uL (ref 150–400)
Platelets: 379 10*3/uL (ref 150–400)
RBC: 4.14 MIL/uL (ref 3.87–5.11)
RBC: 4.22 MIL/uL (ref 3.87–5.11)
RDW: 21.7 % — ABNORMAL HIGH (ref 11.5–15.5)
RDW: 22.2 % — ABNORMAL HIGH (ref 11.5–15.5)
WBC: 5.8 10*3/uL (ref 4.0–10.5)
WBC: 6.2 10*3/uL (ref 4.0–10.5)

## 2010-11-21 LAB — WET PREP, GENITAL
Clue Cells Wet Prep HPF POC: NONE SEEN
Trich, Wet Prep: NONE SEEN
Yeast Wet Prep HPF POC: NONE SEEN

## 2010-11-21 LAB — COMPREHENSIVE METABOLIC PANEL
ALT: 20 U/L (ref 0–35)
AST: 26 U/L (ref 0–37)
Albumin: 2.9 g/dL — ABNORMAL LOW (ref 3.5–5.2)
Alkaline Phosphatase: 45 U/L (ref 39–117)
BUN: 3 mg/dL — ABNORMAL LOW (ref 6–23)
CO2: 22 mEq/L (ref 19–32)
Calcium: 9.2 mg/dL (ref 8.4–10.5)
Chloride: 102 mEq/L (ref 96–112)
Creatinine, Ser: 0.44 mg/dL (ref 0.4–1.2)
GFR calc Af Amer: >60 mL/min (ref >60)
GFR calc non Af Amer: >60 mL/min (ref >60)
Glucose, Bld: 195 mg/dL — ABNORMAL HIGH (ref 70–99)
Potassium: 3.6 mEq/L (ref 3.5–5.1)
Sodium: 131 mEq/L — ABNORMAL LOW (ref 135–145)
Total Bilirubin: 0.4 mg/dL (ref 0.3–1.2)
Total Protein: 6.5 g/dL (ref 6.0–8.3)

## 2010-11-21 LAB — POCT URINALYSIS DIP (DEVICE)
Bilirubin Urine: NEGATIVE
Glucose, UA: NEGATIVE mg/dL
Glucose, UA: NEGATIVE mg/dL
Hgb urine dipstick: NEGATIVE
Hgb urine dipstick: NEGATIVE
Ketones, ur: NEGATIVE mg/dL
Nitrite: NEGATIVE
Nitrite: NEGATIVE
Protein, ur: 100 mg/dL — AB
Protein, ur: 30 mg/dL — AB
Specific Gravity, Urine: 1.025 (ref 1.005–1.030)
Specific Gravity, Urine: 1.02 (ref 1.005–1.030)
Urobilinogen, UA: 1 mg/dL (ref 0.0–1.0)
Urobilinogen, UA: 0.2 mg/dL (ref 0.0–1.0)
pH: 5.5 (ref 5.0–8.0)
pH: 6.5 (ref 5.0–8.0)

## 2010-11-21 LAB — HCG, QUANTITATIVE, PREGNANCY: hCG, Beta Chain, Quant, S: 23397 m[IU]/mL — ABNORMAL HIGH (ref <5)

## 2010-11-21 LAB — GC/CHLAMYDIA PROBE AMP, GENITAL
Chlamydia, DNA Probe: POSITIVE — AB
GC Probe Amp, Genital: NEGATIVE

## 2010-11-21 LAB — RAPID URINE DRUG SCREEN, HOSP PERFORMED
Amphetamines: NOT DETECTED
Barbiturates: NOT DETECTED
Benzodiazepines: NOT DETECTED
Cocaine: NOT DETECTED
Opiates: NOT DETECTED
Tetrahydrocannabinol: NOT DETECTED

## 2010-11-21 LAB — GLUCOSE TOLERANCE, 1 HOUR: Glucose, 1 Hour GTT: 193 mg/dL — ABNORMAL HIGH (ref 70–140)

## 2010-11-21 LAB — SJOGRENS SYNDROME-B EXTRACTABLE NUCLEAR ANTIBODY: SSB (La) (ENA) Antibody, IgG: <0.2 AI (ref <1.0)

## 2010-11-21 LAB — ABO/RH: ABO/RH(D): A POS

## 2010-11-21 LAB — TSH: TSH: 0.961 u[IU]/mL (ref 0.350–4.500)

## 2010-11-21 LAB — SJOGRENS SYNDROME-A EXTRACTABLE NUCLEAR ANTIBODY: SSA (Ro) (ENA) Antibody, IgG: 1.6 AI — ABNORMAL HIGH (ref <1.0)

## 2010-11-27 LAB — URINE MICROSCOPIC-ADD ON

## 2010-11-27 LAB — PROTIME-INR
INR: 1 (ref 0.00–1.49)
Prothrombin Time: 13.2 seconds (ref 11.6–15.2)

## 2010-11-27 LAB — COMPREHENSIVE METABOLIC PANEL
ALT: 54 U/L — ABNORMAL HIGH (ref 0–35)
AST: 47 U/L — ABNORMAL HIGH (ref 0–37)
Albumin: 3.3 g/dL — ABNORMAL LOW (ref 3.5–5.2)
Alkaline Phosphatase: 46 U/L (ref 39–117)
BUN: 8 mg/dL (ref 6–23)
CO2: 23 mEq/L (ref 19–32)
Calcium: 8.7 mg/dL (ref 8.4–10.5)
Chloride: 103 mEq/L (ref 96–112)
Creatinine, Ser: 0.59 mg/dL (ref 0.4–1.2)
GFR calc Af Amer: >60 mL/min (ref >60)
GFR calc non Af Amer: >60 mL/min (ref >60)
Glucose, Bld: 134 mg/dL — ABNORMAL HIGH (ref 70–99)
Potassium: 3.3 mEq/L — ABNORMAL LOW (ref 3.5–5.1)
Sodium: 135 mEq/L (ref 135–145)
Total Bilirubin: 0.6 mg/dL (ref 0.3–1.2)
Total Protein: 7.6 g/dL (ref 6.0–8.3)

## 2010-11-27 LAB — DIFFERENTIAL
Band Neutrophils: 0 % (ref 0–10)
Basophils Absolute: 0 10*3/uL (ref 0.0–0.1)
Basophils Absolute: 0 10*3/uL (ref 0.0–0.1)
Basophils Relative: 0 % (ref 0–1)
Basophils Relative: 0 % (ref 0–1)
Blasts: 0 %
Eosinophils Absolute: 0.2 10*3/uL (ref 0.0–0.7)
Eosinophils Absolute: 0 10*3/uL (ref 0.0–0.7)
Eosinophils Relative: 4 % (ref 0–5)
Eosinophils Relative: 0 % (ref 0–5)
Lymphocytes Relative: 30 % (ref 12–46)
Lymphocytes Relative: 32 % (ref 12–46)
Lymphs Abs: 1.2 10*3/uL (ref 0.7–4.0)
Lymphs Abs: 1 10*3/uL (ref 0.7–4.0)
Metamyelocytes Relative: 0 %
Monocytes Absolute: 0.6 10*3/uL (ref 0.1–1.0)
Monocytes Absolute: 0.1 10*3/uL (ref 0.1–1.0)
Monocytes Relative: 16 % — ABNORMAL HIGH (ref 3–12)
Monocytes Relative: 4 % (ref 3–12)
Myelocytes: 0 %
Neutro Abs: 1.9 10*3/uL (ref 1.7–7.7)
Neutro Abs: 2.1 10*3/uL (ref 1.7–7.7)
Neutrophils Relative %: 50 % (ref 43–77)
Neutrophils Relative %: 64 % (ref 43–77)
Promyelocytes Absolute: 0 %
nRBC: 0 /100 WBC

## 2010-11-27 LAB — POCT I-STAT, CHEM 8
BUN: 9 mg/dL (ref 6–23)
Calcium, Ion: 1.11 mmol/L — ABNORMAL LOW (ref 1.12–1.32)
Chloride: 105 mEq/L (ref 96–112)
Creatinine, Ser: 0.9 mg/dL (ref 0.4–1.2)
Glucose, Bld: 95 mg/dL (ref 70–99)
HCT: 32 % — ABNORMAL LOW (ref 36.0–46.0)
Hemoglobin: 10.9 g/dL — ABNORMAL LOW (ref 12.0–15.0)
Potassium: 3.5 mEq/L (ref 3.5–5.1)
Sodium: 139 mEq/L (ref 135–145)
TCO2: 21 mmol/L (ref 0–100)

## 2010-11-27 LAB — BASIC METABOLIC PANEL
BUN: 10 mg/dL (ref 6–23)
BUN: 7 mg/dL (ref 6–23)
CO2: 24 mEq/L (ref 19–32)
CO2: 24 mEq/L (ref 19–32)
Calcium: 8.6 mg/dL (ref 8.4–10.5)
Calcium: 8.8 mg/dL (ref 8.4–10.5)
Chloride: 101 mEq/L (ref 96–112)
Chloride: 104 mEq/L (ref 96–112)
Creatinine, Ser: 0.55 mg/dL (ref 0.4–1.2)
Creatinine, Ser: 0.58 mg/dL (ref 0.4–1.2)
GFR calc Af Amer: >60 mL/min (ref >60)
GFR calc Af Amer: >60 mL/min (ref >60)
GFR calc non Af Amer: >60 mL/min (ref >60)
GFR calc non Af Amer: >60 mL/min (ref >60)
Glucose, Bld: 136 mg/dL — ABNORMAL HIGH (ref 70–99)
Glucose, Bld: 97 mg/dL (ref 70–99)
Potassium: 3.3 mEq/L — ABNORMAL LOW (ref 3.5–5.1)
Potassium: 3.5 mEq/L (ref 3.5–5.1)
Sodium: 132 mEq/L — ABNORMAL LOW (ref 135–145)
Sodium: 135 mEq/L (ref 135–145)

## 2010-11-27 LAB — CBC
HCT: 29 % — ABNORMAL LOW (ref 36.0–46.0)
HCT: 29.4 % — ABNORMAL LOW (ref 36.0–46.0)
HCT: 28.6 % — ABNORMAL LOW (ref 36.0–46.0)
HCT: 30.3 % — ABNORMAL LOW (ref 36.0–46.0)
Hemoglobin: 9.2 g/dL — ABNORMAL LOW (ref 12.0–15.0)
Hemoglobin: 9.3 g/dL — ABNORMAL LOW (ref 12.0–15.0)
Hemoglobin: 9 g/dL — ABNORMAL LOW (ref 12.0–15.0)
Hemoglobin: 9.4 g/dL — ABNORMAL LOW (ref 12.0–15.0)
MCHC: 31.7 g/dL (ref 30.0–36.0)
MCHC: 31.8 g/dL (ref 30.0–36.0)
MCHC: 31.2 g/dL (ref 30.0–36.0)
MCHC: 31.5 g/dL (ref 30.0–36.0)
MCV: 67 fL — ABNORMAL LOW (ref 78.0–100.0)
MCV: 67.6 fL — ABNORMAL LOW (ref 78.0–100.0)
MCV: 68.3 fL — ABNORMAL LOW (ref 78.0–100.0)
MCV: 68.3 fL — ABNORMAL LOW (ref 78.0–100.0)
Platelets: 322 10*3/uL (ref 150–400)
Platelets: 331 10*3/uL (ref 150–400)
Platelets: 297 10*3/uL (ref 150–400)
Platelets: 342 10*3/uL (ref 150–400)
RBC: 4.29 MIL/uL (ref 3.87–5.11)
RBC: 4.38 MIL/uL (ref 3.87–5.11)
RBC: 4.18 MIL/uL (ref 3.87–5.11)
RBC: 4.43 MIL/uL (ref 3.87–5.11)
RDW: 19.6 % — ABNORMAL HIGH (ref 11.5–15.5)
RDW: 20.3 % — ABNORMAL HIGH (ref 11.5–15.5)
RDW: 20.3 % — ABNORMAL HIGH (ref 11.5–15.5)
RDW: 20.5 % — ABNORMAL HIGH (ref 11.5–15.5)
WBC: 3.9 10*3/uL — ABNORMAL LOW (ref 4.0–10.5)
WBC: 4.1 10*3/uL (ref 4.0–10.5)
WBC: 3.2 10*3/uL — ABNORMAL LOW (ref 4.0–10.5)
WBC: 3.9 10*3/uL — ABNORMAL LOW (ref 4.0–10.5)

## 2010-11-27 LAB — URINALYSIS, ROUTINE W REFLEX MICROSCOPIC
Bilirubin Urine: NEGATIVE
Glucose, UA: NEGATIVE mg/dL
Ketones, ur: NEGATIVE mg/dL
Nitrite: NEGATIVE
Protein, ur: NEGATIVE mg/dL
Specific Gravity, Urine: 1.013 (ref 1.005–1.030)
Urobilinogen, UA: 0.2 mg/dL (ref 0.0–1.0)
pH: 6 (ref 5.0–8.0)

## 2010-11-27 LAB — POCT CARDIAC MARKERS
CKMB, poc: <1 ng/mL — ABNORMAL LOW (ref 1.0–8.0)
CKMB, poc: <1 ng/mL — ABNORMAL LOW (ref 1.0–8.0)
CKMB, poc: <1 ng/mL — ABNORMAL LOW (ref 1.0–8.0)
Myoglobin, poc: 40.2 ng/mL (ref 12–200)
Myoglobin, poc: 53.1 ng/mL (ref 12–200)
Myoglobin, poc: 49.4 ng/mL (ref 12–200)
Troponin i, poc: <0.05 ng/mL (ref 0.00–0.09)
Troponin i, poc: <0.05 ng/mL (ref 0.00–0.09)
Troponin i, poc: 0.15 ng/mL — ABNORMAL HIGH (ref 0.00–0.09)

## 2010-11-27 LAB — CK TOTAL AND CKMB (NOT AT ARMC)
CK, MB: 0.9 ng/mL (ref 0.3–4.0)
CK, MB: 1 ng/mL (ref 0.3–4.0)
CK, MB: 1.1 ng/mL (ref 0.3–4.0)
Relative Index: 0.8 (ref 0.0–2.5)
Relative Index: INVALID (ref 0.0–2.5)
Relative Index: 0.9 (ref 0.0–2.5)
Total CK: 109 U/L (ref 7–177)
Total CK: 96 U/L (ref 7–177)
Total CK: 122 U/L (ref 7–177)

## 2010-11-27 LAB — URINE CULTURE: Colony Count: >=100000

## 2010-11-27 LAB — LIPID PANEL
Cholesterol: 179 mg/dL (ref 0–200)
HDL: 37 mg/dL — ABNORMAL LOW (ref >39)
LDL Cholesterol: 122 mg/dL — ABNORMAL HIGH (ref 0–99)
Total CHOL/HDL Ratio: 4.8 RATIO
Triglycerides: 100 mg/dL (ref <150)
VLDL: 20 mg/dL (ref 0–40)

## 2010-11-27 LAB — CARDIAC PANEL(CRET KIN+CKTOT+MB+TROPI)
CK, MB: 0.9 ng/mL (ref 0.3–4.0)
CK, MB: 0.9 ng/mL (ref 0.3–4.0)
Relative Index: INVALID (ref 0.0–2.5)
Relative Index: INVALID (ref 0.0–2.5)
Total CK: 95 U/L (ref 7–177)
Total CK: 99 U/L (ref 7–177)
Troponin I: 0.06 ng/mL (ref 0.00–0.06)
Troponin I: 0.09 ng/mL — ABNORMAL HIGH (ref 0.00–0.06)

## 2010-11-27 LAB — TSH: TSH: 0.914 u[IU]/mL (ref 0.350–4.500)

## 2010-11-27 LAB — CULTURE, BLOOD (ROUTINE X 2): Culture: NO GROWTH

## 2010-11-27 LAB — D-DIMER, QUANTITATIVE (NOT AT ARMC): D-Dimer, Quant: 0.22 ug/mL-FEU (ref 0.00–0.48)

## 2010-11-27 LAB — POCT PREGNANCY, URINE: Preg Test, Ur: NEGATIVE

## 2010-11-27 LAB — APTT: aPTT: 27 seconds (ref 24–37)

## 2010-11-27 LAB — C-REACTIVE PROTEIN: CRP: 0.2 mg/dL — ABNORMAL LOW (ref <0.6)

## 2010-11-27 LAB — TROPONIN I
Troponin I: 0.01 ng/mL (ref 0.00–0.06)
Troponin I: 0.02 ng/mL (ref 0.00–0.06)
Troponin I: 0.27 ng/mL — ABNORMAL HIGH (ref 0.00–0.06)

## 2010-11-27 LAB — T4, FREE: Free T4: 0.86 ng/dL — ABNORMAL LOW (ref 0.89–1.80)

## 2010-11-27 LAB — D-DIMER, QUANTITATIVE: D-Dimer, Quant: <0.22 ug/mL-FEU (ref 0.00–0.48)

## 2010-11-27 LAB — SEDIMENTATION RATE
Sed Rate: 38 mm/hr — ABNORMAL HIGH (ref 0–22)
Sed Rate: 34 mm/hr — ABNORMAL HIGH (ref 0–22)

## 2010-11-27 LAB — BRAIN NATRIURETIC PEPTIDE: Pro B Natriuretic peptide (BNP): 69 pg/mL (ref 0.0–100.0)

## 2010-11-30 ENCOUNTER — Other Ambulatory Visit: Payer: Self-pay | Admitting: Obstetrics and Gynecology

## 2010-11-30 ENCOUNTER — Other Ambulatory Visit: Payer: Self-pay | Admitting: Obstetrics & Gynecology

## 2010-11-30 DIAGNOSIS — O169 Unspecified maternal hypertension, unspecified trimester: Secondary | ICD-10-CM

## 2010-11-30 DIAGNOSIS — O3410 Maternal care for benign tumor of corpus uteri, unspecified trimester: Secondary | ICD-10-CM

## 2010-11-30 DIAGNOSIS — Z3689 Encounter for other specified antenatal screening: Secondary | ICD-10-CM

## 2010-11-30 DIAGNOSIS — Z331 Pregnant state, incidental: Secondary | ICD-10-CM

## 2010-11-30 DIAGNOSIS — O99019 Anemia complicating pregnancy, unspecified trimester: Secondary | ICD-10-CM

## 2010-11-30 DIAGNOSIS — M359 Systemic involvement of connective tissue, unspecified: Secondary | ICD-10-CM

## 2010-11-30 LAB — POCT URINALYSIS DIP (DEVICE)
Glucose, UA: NEGATIVE mg/dL
Ketones, ur: NEGATIVE mg/dL
Nitrite: NEGATIVE
Protein, ur: 30 mg/dL — AB
Specific Gravity, Urine: >=1.03 (ref 1.005–1.030)
Urobilinogen, UA: 1 mg/dL (ref 0.0–1.0)
pH: 6 (ref 5.0–8.0)

## 2010-12-12 ENCOUNTER — Inpatient Hospital Stay (HOSPITAL_COMMUNITY)
Admission: AD | Admit: 2010-12-12 | Discharge: 2010-12-12 | Disposition: A | Discharge status: Home / Self Care | Payer: BLUE CROSS/BLUE SHIELD | Source: Ambulatory Visit | Attending: Obstetrics & Gynecology | Admitting: Obstetrics & Gynecology

## 2010-12-12 DIAGNOSIS — R109 Unspecified abdominal pain: Secondary | ICD-10-CM

## 2010-12-12 DIAGNOSIS — O2390 Unspecified genitourinary tract infection in pregnancy, unspecified trimester: Secondary | ICD-10-CM | POA: Insufficient documentation

## 2010-12-12 DIAGNOSIS — N390 Urinary tract infection, site not specified: Secondary | ICD-10-CM | POA: Insufficient documentation

## 2010-12-12 DIAGNOSIS — N760 Acute vaginitis: Secondary | ICD-10-CM | POA: Insufficient documentation

## 2010-12-12 DIAGNOSIS — A499 Bacterial infection, unspecified: Secondary | ICD-10-CM | POA: Insufficient documentation

## 2010-12-12 DIAGNOSIS — O10019 Pre-existing essential hypertension complicating pregnancy, unspecified trimester: Secondary | ICD-10-CM | POA: Insufficient documentation

## 2010-12-12 DIAGNOSIS — B9689 Other specified bacterial agents as the cause of diseases classified elsewhere: Secondary | ICD-10-CM | POA: Insufficient documentation

## 2010-12-12 LAB — URINALYSIS, ROUTINE W REFLEX MICROSCOPIC
Bilirubin Urine: NEGATIVE
Glucose, UA: NEGATIVE mg/dL
Hgb urine dipstick: NEGATIVE
Ketones, ur: 15 mg/dL — AB
Nitrite: NEGATIVE
Protein, ur: 30 mg/dL — AB
Specific Gravity, Urine: >1.03 — ABNORMAL HIGH (ref 1.005–1.030)
Urobilinogen, UA: 1 mg/dL (ref 0.0–1.0)
pH: 6 (ref 5.0–8.0)

## 2010-12-12 LAB — WET PREP, GENITAL
Trich, Wet Prep: NONE SEEN
Yeast Wet Prep HPF POC: NONE SEEN

## 2010-12-12 LAB — URINE MICROSCOPIC-ADD ON

## 2010-12-13 ENCOUNTER — Other Ambulatory Visit: Payer: Self-pay | Admitting: Family Medicine

## 2010-12-13 DIAGNOSIS — O169 Unspecified maternal hypertension, unspecified trimester: Secondary | ICD-10-CM

## 2010-12-13 DIAGNOSIS — Z331 Pregnant state, incidental: Secondary | ICD-10-CM

## 2010-12-13 DIAGNOSIS — R87612 Low grade squamous intraepithelial lesion on cytologic smear of cervix (LGSIL): Secondary | ICD-10-CM

## 2010-12-13 LAB — POCT URINALYSIS DIP (DEVICE)
Glucose, UA: NEGATIVE mg/dL
Hgb urine dipstick: NEGATIVE
Ketones, ur: 15 mg/dL — AB
Nitrite: NEGATIVE
Protein, ur: 30 mg/dL — AB
Specific Gravity, Urine: >=1.03 (ref 1.005–1.030)
Urobilinogen, UA: 1 mg/dL (ref 0.0–1.0)
pH: 6 (ref 5.0–8.0)

## 2010-12-16 LAB — URINE CULTURE
Colony Count: 30000
Culture  Setup Time: 201205020112

## 2010-12-21 ENCOUNTER — Other Ambulatory Visit: Payer: Self-pay | Admitting: Physician Assistant

## 2010-12-21 ENCOUNTER — Inpatient Hospital Stay (HOSPITAL_COMMUNITY)
Admission: AD | Admit: 2010-12-21 | Discharge: 2010-12-21 | Disposition: A | Discharge status: Home / Self Care | Payer: BLUE CROSS/BLUE SHIELD | Source: Ambulatory Visit | Attending: Obstetrics and Gynecology | Admitting: Obstetrics and Gynecology

## 2010-12-21 ENCOUNTER — Ambulatory Visit (HOSPITAL_COMMUNITY)
Admission: RE | Admit: 2010-12-21 | Discharge: 2010-12-21 | Disposition: A | Discharge status: Home / Self Care | Payer: BLUE CROSS/BLUE SHIELD | Source: Ambulatory Visit | Attending: Obstetrics and Gynecology | Admitting: Obstetrics and Gynecology

## 2010-12-21 DIAGNOSIS — O10019 Pre-existing essential hypertension complicating pregnancy, unspecified trimester: Secondary | ICD-10-CM | POA: Insufficient documentation

## 2010-12-21 DIAGNOSIS — O09529 Supervision of elderly multigravida, unspecified trimester: Secondary | ICD-10-CM | POA: Insufficient documentation

## 2010-12-21 DIAGNOSIS — O358XX0 Maternal care for other (suspected) fetal abnormality and damage, not applicable or unspecified: Secondary | ICD-10-CM | POA: Insufficient documentation

## 2010-12-21 DIAGNOSIS — Z1389 Encounter for screening for other disorder: Secondary | ICD-10-CM | POA: Insufficient documentation

## 2010-12-21 DIAGNOSIS — O09299 Supervision of pregnancy with other poor reproductive or obstetric history, unspecified trimester: Secondary | ICD-10-CM | POA: Insufficient documentation

## 2010-12-21 DIAGNOSIS — Z3689 Encounter for other specified antenatal screening: Secondary | ICD-10-CM

## 2010-12-21 DIAGNOSIS — O99019 Anemia complicating pregnancy, unspecified trimester: Secondary | ICD-10-CM

## 2010-12-21 DIAGNOSIS — Z331 Pregnant state, incidental: Secondary | ICD-10-CM

## 2010-12-21 DIAGNOSIS — O169 Unspecified maternal hypertension, unspecified trimester: Secondary | ICD-10-CM

## 2010-12-21 DIAGNOSIS — O139 Gestational [pregnancy-induced] hypertension without significant proteinuria, unspecified trimester: Secondary | ICD-10-CM

## 2010-12-21 DIAGNOSIS — Z363 Encounter for antenatal screening for malformations: Secondary | ICD-10-CM | POA: Insufficient documentation

## 2010-12-21 DIAGNOSIS — E669 Obesity, unspecified: Secondary | ICD-10-CM | POA: Insufficient documentation

## 2010-12-21 DIAGNOSIS — IMO0002 Reserved for concepts with insufficient information to code with codable children: Secondary | ICD-10-CM

## 2010-12-21 DIAGNOSIS — M329 Systemic lupus erythematosus, unspecified: Secondary | ICD-10-CM

## 2010-12-21 DIAGNOSIS — O09219 Supervision of pregnancy with history of pre-term labor, unspecified trimester: Secondary | ICD-10-CM

## 2010-12-21 LAB — POCT URINALYSIS DIP (DEVICE)
Bilirubin Urine: NEGATIVE
Glucose, UA: NEGATIVE mg/dL
Hgb urine dipstick: NEGATIVE
Ketones, ur: NEGATIVE mg/dL
Nitrite: NEGATIVE
Protein, ur: NEGATIVE mg/dL
Specific Gravity, Urine: 1.01 (ref 1.005–1.030)
Urobilinogen, UA: 0.2 mg/dL (ref 0.0–1.0)
pH: 6.5 (ref 5.0–8.0)

## 2010-12-26 NOTE — Discharge Summary (Signed)
[name], [name]             ACCOUNT NO.:  [id number]   MEDICAL RECORD NO.:  [id number]          PATIENT TYPE:  INP   LOCATION:  3737                         FACILITY:  [facility]   PHYSICIAN:  Jake Bathe, MD      DATE OF BIRTH:  Mar 16, 1976   DATE OF ADMISSION:  09/08/2008  DATE OF DISCHARGE:  09/10/2008                               DISCHARGE SUMMARY   CARDIOLOGIST:  Corky Crafts, MD   FINAL DIAGNOSES:  1. Chest pain - atypical, cardiac biomarkers were mildly elevated with      troponin of 0.27 down to 0.09 down to 0.06 within normal limits.      Both her CK and MB were 122/2.1, 99/0.9 respectively and      unremarkable.  Nuclear stress test (2-day study) was performed and      showed no evidence of ischemia.  Breast attenuation was noted.      Normal ejection fraction.  Low risk study.  She exercised for 6      minutes and 16 seconds on the Bruce protocol.  Her baseline ECG      showed subtle T-wave inversions in III, F, V5-V6 at baseline with      no changes during stress.  She had no symptoms during stress.  She      had no adverse arrhythmias.  2. Lupus - she has not been on any medications for several years and      has been lost to followup with Rheumatology.  She was restarted on      Plaquenil 200 mg twice a day during this hospitalization originally      by Dr. Elease Hashimoto.  She is to followup with Rheumatology.  3. Hypertension - quite elevated on admission with blood pressure      originally 200/120 down to the 130s/80 systolic.      Hydrochlorothiazide 25 mg was added to her medications of Diovan      160 mg twice a day and verapamil 240 mg once a day, sustained      release.  Given her severe hypertension, likely etiology for      possible chest discomfort and/or mild elevation in cardiac      biomarkers, i.e. subendocardial ischemia from hypertensive urgency.  4. Obesity - encourage weight loss.  5. Hypokalemia - on day of discharge, her potassium level was 3.3  down      from 3.5.  She was given 40 mEq of potassium chloride.  This may be      secondary to both Lasix and hydrochlorothiazide that she received      yesterday.  Note, with hydrochlorothiazide it may be beneficial to      follow her potassium level as an outpatient.  I asked her to      increase banana consumption and orange juice.  I have not given her      any tablet supplementation.   PROCEDURES:  1. A nuclear stress test as described above, low-risk with no      ischemia, normal ejection fraction.  2. Echocardiogram from August 19, 2008 showed  ejection fraction of 60-      65% with no wall motion abnormality with mild-to-moderate left      ventricular hypertrophy, mild aortic insufficiency, and mild mitral      regurgitation.   LABORATORY DATA:  D-dimer was 0.22, within normal limits.  ESR or sed  rate was mildly elevated at 34.  BUN was 7 and creatinine 0.5.  Troponins as listed above.   BRIEF HOSPITAL COURSE:  A 35 year old female with obesity, hypertension,  and lupus who was recently admitted with a bout of atypical chest pain  who returned with once again 4-day history of intermittent chest pain,  dull, usually lasting all day in duration.  Her blood pressure was  highly elevated upon admission in the 200 systolic range.  Her pressure  regimen was increased as described above.  She was stable from a blood  pressure perspective.  Her troponin was noted to be mildly elevated and  this was thought to be secondary to her severe hypertension or  subendocardial ischemia.  Nuclear stress test was performed which showed  no evidence of ischemia or overall low risk profile.   In regards to her lupus, she has not seen her rheumatologist for a while  and Plaquenil was restarted at 200 mg twice a day.  This was discussed  with Dr. Rito Ehrlich on admission by Dr. Elease Hashimoto.  She was ambulating well,  feeling good on day of discharge with no further chest pain.   DISCHARGE MEDICATIONS:   1. Aspirin 81 mg once a day.  2. Diovan 160 mg twice a day.  3. Verapamil SR 240 mg once a day.  4. Hydrochlorothiazide 25 mg once a day.  New medication.  5. Plaquenil or hydroxychloroquine 200 mg twice a day (I gave her 2      refills, she needs to see Rheumatology).   DISCHARGE INSTRUCTIONS:  Advance activity slowly.  Monitor blood  pressure.  Monitor diet and exercise, low-sodium diet.  If any further  worrisome symptoms develop, please promptly contact us or proceed to the  emergency department.      Jake Bathe, MD  Electronically Signed     MCS/MEDQ  D:  09/10/2008  T:  09/11/2008  Job:  [id number]

## 2010-12-26 NOTE — Group Therapy Note (Signed)
[name], ROARK NO.:  [id number]   MEDICAL RECORD NO.:  [id number]          PATIENT TYPE:  WOC   LOCATION:  [facility]                   FACILITY:  [facility]   PHYSICIAN:  Caren Griffins, CNM       DATE OF BIRTH:  [birth year]   DATE OF SERVICE:  02/16/2009                                  CLINIC NOTE   REASON FOR VISIT:  Follow-up of 18-week IUFD.   HISTORY:  This is a 35 year old G4, P-0-2-2-2.  She presented to triage  June 18 with cramping and bleeding and was noted to have a fetal demise.  She underwent Cytotec induction.  Since then she came back to triage for  abdominal pain, had an ultrasound which was significant for three  fibroids measuring in largest diameters 5.9, 4.1 and 2.2.  She has  multiple medical problems, metabolic syndrome including morbid obesity,  abnormal glucose tolerance, hypercholesterolemia, chronic hypertension,  lupus, chronic anemia with a history of DUB.  She does have primary care  from [facility] and is taking her antihypertensive and her iron as  directed.  She states she is coping well with the loss and has a support  system and religion which is solace for her.  She received Depo post and  has had scant brown spotting.  No obvious bleeding for 3 days.  She has  already begun to work on her diet and is very motivated to lose weight.  She just joined the [employer] in order to do regular exercise.   PHYSICAL EXAM:  Temperature 97.2, pulse 104, BP 123/76, weight 224 which  is down from 229 at her last prenatal visit.  She is pleasant and in no  apparent distress.  Affect is normal.  ABDOMEN:  Soft, nontender, obese.  Difficult to outline uterus.  However, does seem to have enlargement about halfway to umbilicus and  firmness consistent with fibroids.  Pelvic exam also confirms this.  However, due to body habitus it is  very difficult to outline.  There is  minimal brown spotting.  Cervix is long and closed.   ASSESSMENT:  1.  Status post second trimester fetal demise, coping well with loss.  2. Newly diagnosed fibroid is explained to the patient.   PLAN:  We discussed that she will need followup with her primary care  physician to investigate her abnormal glucose tolerance at about 6 weeks  postpartum.  Meanwhile, she should continue taking her iron for the 6  weeks and is commended in  her efforts to lose weight and get in better  shape.  She would like to continue with the Depo-Provera for now due to  her history of abnormal heavy periods when she was not on Depo.  However  she is also considering permanent sterilization and will let us know  about that when she comes back in September for her next Depo shot.           ______________________________  Caren Griffins, CNM     DP/MEDQ  D:  02/16/2009  T:  02/16/2009  Job:  [id number]

## 2010-12-26 NOTE — H&P (Signed)
[name], KEN NO.:  [id number]   MEDICAL RECORD NO.:  [id number]          PATIENT TYPE:  OBV   LOCATION:  2025                         FACILITY:  [facility]   PHYSICIAN:  Renee Ramus, MD       DATE OF BIRTH:  [birth year]   DATE OF ADMISSION:  08/18/2008  DATE OF DISCHARGE:                              HISTORY & PHYSICAL   Katherine Gowda, MD   HISTORY OF PRESENT ILLNESS:  The patient is a 35 year old female with  complaints of chest pain x12 hours prior to admission.  The patient  describes the pain is as if it is radiating from her chest to her neck  and left shoulder down her left arm.  She says, has both sharp and dull  components.  She says that the sharp pain increases with deep  inspiration and that the pain in general lasts approximately 10 seconds,  but is recurrent.  The patient says it is associated with nausea, but no  vomiting.  Rates her pain as 8/10 initially.  The patient also comes in  with malignant hypertension with systolic blood pressure in the 200s.  After medication and IV fluids, her pressures dropped to 159/102.  The  patient had been admitted for observation, rule out MI and control of  malignant hypertension.   PAST MEDICAL HISTORY:  1. Lupus.  2. Hypertension.  3. Obesity.  4. Hyperlipidemia.   SOCIAL HISTORY:  No tobacco.  No alcohol use.  The patient lives at  home.   FAMILY HISTORY:  Not available.   REVIEW OF SYSTEMS:  All other comprehensive review of systems are  negative.   ALLERGIES:  SEPTRA.   CURRENT MEDICATIONS:  Diovan 160 mg 1 p.o. b.i.d.  She has also  prescribed Norvasc and pravastatin and Aleve, but is not taking any of  these.   PHYSICAL EXAMINATION:  GENERAL:  This is a well-developed, well-  nourished somewhat obese black female, currently in no apparent  distress.  VITAL SIGNS:  Blood pressure 159/102, heart rate 76, respiratory rate  20, and temperature 98.1.  HEENT:  No jugular venous distention or  lymphadenopathy.  Oropharynx is  clear.  Mucous membranes pink and moist.  TMs clear bilaterally.  Pupils  are equal and reactive to light and accommodation.  Extraocular muscles  are intact.  CARDIOVASCULAR:  Regular rate and rhythm without murmurs, rubs, or  gallops.  PULMONARY:  Lungs clear to auscultation bilaterally.  ABDOMEN:  Soft, obese, nontender, and nondistended without  hepatosplenomegaly.  Bowel sounds are present.  She has no rebound or  guarding.  EXTREMITIES:  She has no clubbing, cyanosis, or edema.  She has good  peripheral pulses, dorsalis pedis, and radial arteries.  She is able to  move all extremities.  NEUROLOGIC:  Cranial nerves II-XII are grossly intact.  She has no focal  neurological deficits.   STUDIES:  1. Chest X-Ray: Shows cardiomegaly and low-lung volumes.  2. EKG shows normal sinus rhythm.   LABORATORIES:  White count 3.9, H and H 11 and 32, MCV 67, and platelets  331.  Sodium 139,  potassium 3.5, chloride 105, bicarb 21, BUN 9,  creatinine 0.9, glucose 95, and BNP of 69.  UA shows moderate blood, but  no evidence of dehydration or urinary tract infection.   ASSESSMENT AND PLAN:  1. Chest pain, likely secondary to pneumonitis.  Recheck cardiac      enzymes and use nonsteroidal antiinflammatory drugs and morphine      for pain.  2. Lupus, check C-reactive protein, but doubt lupus flare.  3. Obesity.  Check TSH and free T4.  4. Hypertension.  We will add clonidine 0.1 mg p.o. b.i.d.  5. Hyperlipidemia.  Check lipid panel and likely continue statin at      discharge.  6. Disposition.  Hopeful for discharge in a.m. after review of ordered      laboratories and reassess and reassessment of patient.  History and      physical was constructed by reviewing past medical history      conferring with emergency medical room physician, and reviewing      emergency medical record.   TIME SPENT:  One hour.      Renee Ramus, MD  Electronically  Signed     JF/MEDQ  D:  08/18/2008  T:  08/19/2008  Job:  [id number]

## 2010-12-26 NOTE — Discharge Summary (Signed)
NAMESHEVY, YANEY             ACCOUNT NO.:  [id number]   MEDICAL RECORD NO.:  [id number]          PATIENT TYPE:  INP   LOCATION:  9372                          [facility]   PHYSICIAN:  Horton Chin, MD DATE OF BIRTH:  09-Jan-1976   DATE OF ADMISSION:  01/28/2009  DATE OF DISCHARGE:  01/29/2009                               DISCHARGE SUMMARY   ADMISSION DIAGNOSES:  Intrauterine fetal demise at 59 and 4/7 weeks,  preterm premature rupture of membranes with advanced cervical  examination.   DISCHARGE DIAGNOSES:  Status post induction of labor with high-dose  Cytotec and spontaneous vaginal delivery of a 18-week fetal demise.   BRIEF HOSPITAL COURSE:  The patient is a 35 year old gravida 4, para 0-2-  1-2, who presented at 62 and 5/7 weeks' gestation with complaint of  abdominal pain.  On examination, she was noted to have a bulging bag of  membranes and followup ultrasound also confirmed the intrauterine fetal  demise.  While in the [facility], she also had a spontaneous rupture of  membranes and was having frequent contractions.  Given the fetal demise  and PPROM, the patient was counseled regarding expectant management  versus Cytotec administration.  She opted for Cytotec induction and  received one dose of 600 mcg.  She went on to have a vaginal delivery of  a nonviable female infant around 2:34 a.m. on January 29, 2009, weight was  270 g.  The second dose of Cytotec was placed after delivery which  helped in delivering the placenta about 2 hours later.  Of note, the  placenta delivered in two large fragments, and the second fragment had  to be removed from the lower uterine segment with the help of a ring  forceps.  The patient tolerated the delivery well.  The only concern  during her delivery was her blood pressures.  The patient does have a  history of chronic hypertension and lupus, and her blood pressures were  not adequately controlled for the last few months.  She has  been on  labetalol since she was pregnant, but prior to that she was on Exforge.  Of note, at the time of admission, her blood pressures were as high as  200s/120s without any symptoms.  She did require 2 doses of hydralazine  IV to acutely bring her blood pressure down overnight. The patient was  placed back on her normal home regimen this morning and blood pressure  was down to the 150s/90s.  By the late morning of January 29, 2009, the  patient was noted to be stable, her bleeding was minimal, her abdominal  pain was minimal, and her blood pressure was better controlled on her  home regimen of Exforge.  The patient was also tolerating a regular diet  and ambulating without difficulty.  She was deemed stable for discharge  to home.   DISCHARGE MEDICATIONS:  1. Exforge 10/160 mg p.o. b.i.d.  2. Percocet 5/325, 1-2 tablets p.o. q.6 h. p.r.n. for severe pain.  3. Ibuprofen 800 mg p.o. q.8 h. p.r.n. pain.   DISCHARGE INSTRUCTIONS:  Pelvic rest for the  next 4 weeks and routine  postpartum discharge instructions.  She was also told to follow up with  her rheumatologist and her primary care physician for care of her  chronic hypertension and lupus which are badly controlled at this point.  The patient will also follow up in the Gynecologic Clinic in about 2  weeks for her followup appointment.  The patient is also interested in a  bilateral tubal sterilization and that will be discussed at this visit.  Of note, she is receiving a dose of Depo-Provera today prior to  discharge for  birth control. The patient was advised to come to [facility] for any heavy  vaginal bleeding, fevers, uncontrolled abdominal pain, or any of the  postpartum concerns.  Of note, the patient was seen by social services  and given resources for [facility] bereavement support group prior to  discharge.      Horton Chin, MD  Electronically Signed     UAA/MEDQ  D:  01/29/2009  T:  01/29/2009  Job:  [id number]

## 2010-12-26 NOTE — Consult Note (Signed)
[name], [name]             ACCOUNT NO.:  [id number]   MEDICAL RECORD NO.:  [id number]          PATIENT TYPE:  OBV   LOCATION:  2025                         FACILITY:  [facility]   PHYSICIAN:  [name], MDDATE OF BIRTH:  Mar 10, 1976   DATE OF CONSULTATION:  08/19/2008  DATE OF DISCHARGE:  08/19/2008                                 CONSULTATION   REFERRING PHYSICIAN:  Dr. Angelita Ingles. Pittman.   REASON FOR CONSULTATION:  Chest pain.   HISTORY OF PRESENT ILLNESS:  The patient is a 35 year old who has had  difficult to control high blood pressure.  She also has obesity.  She  has had waxing and waning chest pain over the past 24-36 hours.  The  pain is worse when she lies down or she takes a deep breath in.  It is  better when she sits up.  She was admitted to the hospital and ruled out  for MI by enzymes.  She has not had any exertional chest pain.  She is  trying to walk more to lose weight.  She has lost a few pound.   ALLERGIES:  BACTRIM.   MEDICATIONS:  1. Aspirin 81 mg daily.  2. Catapres 0.1 mg b.i.d.  3. Lovenox.  4. Toradol.  5. Benicar 20 mg daily.   PAST MEDICAL HISTORY:  1. Lupus.  2. Hypertension.  3. Obesity.  4. Hyperlipidemia.   SOCIAL HISTORY:  She lives in [place].  She does not smoke, drink, or  use drugs.   FAMILY HISTORY:  High blood pressure does run in her family.   REVIEW OF SYSTEMS:  No fevers or chills.  She has had nasal congestion  and cough along with chest pain as described above.  No rash.  No  dysuria, joint pains, nausea, vomiting.  All other systems are negative.   PHYSICAL EXAMINATION:  VITAL SIGNS:  Blood pressure 117/71 this morning,  heart rate of 85.  GENERAL:  She is awake and alert, in no apparent distress.  HEAD:  Normocephalic, atraumatic.  EYES:  Extraocular movements are intact.  NECK:  No JVD.  CARDIOVASCULAR:  Regular rate and rhythm, S1 and S2.  No murmurs or  rubs.  LUNGS:  Clear to auscultation bilaterally.  ABDOMEN:  Soft, nontender, obese.  CHEST:  Chest wall showed pain to palpation at the left sternal border,  which had reproduced the pain that brought her into the hospital.  EXTREMITIES:  No edema.  NEURO:  No focal deficits.  SKIN:  No rash.  BACK:  No kyphosis.   EKG on admission showed normal sinus rhythm with evidence of left atrial  enlargement.  There is a nonspecific ST abnormality..  Her QT interval  is mildly prolonged as well.  I personally reviewed the ECGs from  August 18, 2008, and August 19, 2008.  Chest x-ray shows mild pulmonary  vascular congestion.  Lab work showed a creatinine of 0.6.  Troponin  negative.  D-dimer was negative.  BNP 69.   MEDICAL DECISION-MAKING:  This is a 35 year old with atypical chest  pain.   PLAN:  1. This sounds  more pleuritic in nature.  I would treat her for      pericarditis with ibuprofen 600 mg t.i.d. for 7 days.  We could      perform an outpatient stress test if necessary based on her      symptoms.  2. Continue aggressive blood pressure control.  She was supposed to be      started on a calcium channel blocker.  I had given her a      prescription for verapamil, she is yet to start this medicine.  She      also needs to be on lipid-lowering therapy.  3. Her echocardiogram shows left ventricular hypertrophy.  This is      concerning given her young age.  We need to be aggressive in terms      of controlling her blood pressure.  Hopefully with ARB therapy, we      can reverse some of the LVH.  4. Obesity.  She will benefit from weight loss.  5. Abnormal ECG.  We will repeat an ECG in our office.  She has not      had any syncope or palpitations.  She has not had any arrhythmia on      telemetry.      Katherine Crafts, MD  Electronically Signed     JSV/MEDQ  D:  08/19/2008  T:  08/20/2008  Job:  [id number]

## 2010-12-26 NOTE — H&P (Signed)
NAMESHONDA, MANDARINO NO.:  [id number]   MEDICAL RECORD NO.:  [id number]          PATIENT TYPE:  INP   LOCATION:  3735                         FACILITY:  [facility]   PHYSICIAN:  Vesta Mixer, M.D. DATE OF BIRTH:  May 28, 1976   DATE OF ADMISSION:  09/08/2008  DATE OF DISCHARGE:                              HISTORY & PHYSICAL   [name] is a 35 year old black female with a history of lupus,  hypertension, and chest pain.  She is admitted with episodes of chest  pain and severe hypertension.   The patient has had a 10-year history of lupus.  She was treated and  diagnosed originally in [place], [place].  She moved down to  [place] approximately 2 years ago.  She has been seeing a medical  doctor, but has not seen a rheumatologist.  She has also not received  any medicine for her lupus.   She started having episodes of hypertension about 3 years ago.  She was  recently seen by Dr. Eldridge Dace for further management of this.  He has  been slowly working on her high blood pressure, but with minimal  success.   She has been seen in the emergency room numerous times over the past  year or so.  She was hospitalized earlier this month for episodes of  chest pain.  She ruled out for myocardial infarction and was discharged  home.  She had an echocardiogram on January 7, which revealed normal  left ventricular systolic function with an EF of 50-60%.  There was mild  aortic insufficiency, mild mitral regurgitation, and mild left atrial  enlargement.  The pericardium was noted to be normal and there was no  mention of pericardial effusion or pericardial thickening.   The patient started having intermittent episodes of chest pain 4 days  ago.  The chest pains were described as sharp.  Chest pains had only  lasted for a second or so, but then she developed a dull chest ache.  These episodes have occurred off and on since Sunday and flaccid as much  as a day.  She  presented to the emergency room for further evaluation.  The patient was markedly hypertensive when she presented (200/120).  She  received some pain medications and her blood pressure and heart rate  came down nicely.   The patient feels quite a bit better and is no longer having any  episodes of chest pain.   Her current medications or Diovan 160 mg p.o. b.i.d., verapamil 240 mg a  day.   She is allergic to SULFA.   PAST MEDICAL HISTORY:  1. Hypertension.  2. Lupus.  3. Chest pain.  4. Obesity.   SOCIAL HISTORY:  The patient is a nonsmoker.  She drinks alcohol only  rarely.  She works at [employer].  She has been on disability for  hypertension for the past 2 months.   FAMILY HISTORY:  Her grandmother had hypertension.  Her mother has a  history of diabetes.   REVIEW OF SYSTEMS:  Significant for chest pain.  She denies any nausea,  vomiting, or diaphoresis.  She denies any heat or cold intolerance,  weight gain, or weight loss.  She has had some recent swelling of her  left leg.  She denies any blood in her urine, blood in her stool.  She  does have some diffuse swelling and achiness of her hands.  She does not  get any regular exercise, so she does not know about her exercise  intolerance.  All other systems were reviewed and are negative.   PHYSICAL EXAMINATION:  GENERAL:  On exam, she is a young black female in  no acute distress.  She is currently pain free.  VITAL SIGNS:  Blood pressure currently is 152/90 with a heart rate of  76.  NECK:  There is no JVD, no thyromegaly.  Her neck is supple.  HEENT:  She is normocephalic and atraumatic.  LUNGS:  Clear.  BACK:  Nontender.  HEART:  Regular rate.  S1 and S2.  PMI is nondisplaced.  ABDOMEN:  Obese.  She has no hepatosplenomegaly.  There are no masses or  bruits.  EXTREMITIES:  No clubbing, cyanosis, or edema.  Gait was not assessed.  SKIN:  Without rashes that I can tell.  NEUROLOGIC:  Cranial nerves II through XII  are intact and her motor and  sensory functions were intact.   LABORATORY DATA:  Her troponin is 0.15.  Her CPKs are negative.  Her  white blood cell count is 3.2, her hemoglobin is 9.4 with her hematocrit  of 30.3.  Sodium is 135, potassium is 3.5, chloride is 104, CO2 is 24,  BUN 7, creatinine 2.55, glucose is 97.  Her EKG reveals normal sinus  rhythm.  There are no ST or T-wave changes.   IMPRESSION AND PLAN:  1. Chest pain.  The patient's chest pains are slightly atypical, but      she does have an elevated troponin level.  Looking back in her      records from her previous hospitalization 2 weeks ago, her troponin      levels were normal.  This still could be a lab error as some of the      point-of-care markers have been known to be elevated.  We will      admit her to the hospital and check serial CPK and troponin levels.      It is also possible that this could be due to hypertension for the      past couple of days.  2. Hypertension.  Her blood pressure is much better on some pain      medications.  We will give her a dose of Lasix tonight.  We will      start hydrochlorothiazide tomorrow.  We will continue with the same      medications.  She has been working on a low-salt diet.  3. Lupus.  The patient has a history of lupus for the past 10 years.      She was treated up in [place], [place], but has not had any      therapy in the past 2 years.  She previously was on Plaquenil 200      mg twice a day.  I have called Dr. Rito Ehrlich for advice regarding      this.  He will see the patient this evening and make some      recommendations.  She quite likely needs to be restarted on her      Plaquenil.  She needs to be referred to  a rheumatologist      ultimately.   I anticipate that she will be able to go home if her labs are better  tomorrow.      Vesta Mixer, M.D.  Electronically Signed     PJN/MEDQ  D:  09/08/2008  T:  09/09/2008  Job:  [id number]   cc:    Corky Crafts, MD

## 2010-12-26 NOTE — Discharge Summary (Signed)
NAMEESRA, FRANKOWSKI             ACCOUNT NO.:  [id number]   MEDICAL RECORD NO.:  [id number]          PATIENT TYPE:  OBV   LOCATION:  2025                         FACILITY:  [facility]   PHYSICIAN:  Richarda Overlie, MD       DATE OF BIRTH:  October 03, 1975   DATE OF ADMISSION:  08/18/2008  DATE OF DISCHARGE:  08/19/2008                               DISCHARGE SUMMARY   DISCHARGE DIAGNOSES:  1. Pleurisy.  2. History of lupus.  3. Morbid obesity.  4. Dyslipidemia.   SUBJECTIVE:  This is a 35 year old female with a complaint of chest pain  for 12 hours prior to admission.  The patient describes the pain  starting in her left shoulder, radiating into her chest, with both sharp  and dull components to the pain.  The chest pain was aggravated by  inspiration and improved with sitting up, not particularly related to  exertion.  The patient's troponins remained negative.  EKG showed  nonspecific ST-T changes.  The patient saw Dr. Eldridge Dace on December 20  and has a follow-up appointment scheduled for January 12.  The patient  was found to be hypertensive with systolic blood pressure in the 150s at  the time of presentation.  Otherwise, the patient was found to be  hemodynamically stable.  Chest x-ray showed cardiomegaly with low-lung  volumes.  The patient was admitted to a telemetry bed for serial  troponins and EKG which remained negative.  The patient had a more  normal CBC except for anemia noted with a hemoglobin of 9.2 and 29 but a  normal platelet count.  She was also found to be mildly hypokalemic with  a potassium of 3.3 which was repleted.  The patient's CK-MB and troponin  remained within normal limits.  Cholesterol panel showed an LDL  cholesterol 122, HDL of 37, triglycerides of 100.  Thyroid function  studies were within normal limits.   The patient was evaluated by Dr. Eldridge Dace who recommended treated with  high-dose ibuprofen 3 times a day with food for 1 week for her pleuritic  chest pain thought to be secondary to an upper respiratory tract  infection.  The patient was also started on verapamil for her  hypertension.  Her Diovan was continued.  The patient was also placed on  a low-dose aspirin.   As per Dr. Hoyle Barr assessment, he will see her in his office next  week.  At that point, an outpatient stress test would be scheduled.   DISCHARGE MEDICATIONS:  1. Aspirin 81 mg p.o. daily.  2. Ibuprofen 600 mg p.o. q.8 h. for 3 days then p.r.n. q.8 h.  3. Vicodin 5/325 p.o. q.6 h. p.r.n. pain.  4. Calan SR 120 mg p.o. daily.  5. Dyazide 160 mg p.o. twice a day.  6. Norvasc 10 mg p.o. daily.  7. Rosuvastatin 10 mg p.o. daily.   FOLLOW UP:  With Dr. Eldridge Dace on January 12 and with PCP in 5-7 days.      Richarda Overlie, MD  Electronically Signed     NA/MEDQ  D:  08/19/2008  T:  08/19/2008  Job:  [id number] 

## 2010-12-28 ENCOUNTER — Encounter: Payer: Self-pay | Admitting: Family Medicine

## 2010-12-28 ENCOUNTER — Ambulatory Visit: Payer: BLUE CROSS/BLUE SHIELD

## 2010-12-29 ENCOUNTER — Other Ambulatory Visit: Payer: Self-pay | Admitting: Obstetrics and Gynecology

## 2010-12-29 DIAGNOSIS — R772 Abnormality of alphafetoprotein: Secondary | ICD-10-CM

## 2011-01-02 ENCOUNTER — Ambulatory Visit (HOSPITAL_COMMUNITY): Admission: RE | Admit: 2011-01-02 | Payer: BLUE CROSS/BLUE SHIELD | Source: Ambulatory Visit

## 2011-01-02 ENCOUNTER — Encounter (HOSPITAL_COMMUNITY): Payer: BLUE CROSS/BLUE SHIELD

## 2011-01-04 ENCOUNTER — Other Ambulatory Visit: Payer: Self-pay | Admitting: Obstetrics and Gynecology

## 2011-01-04 DIAGNOSIS — O09219 Supervision of pregnancy with history of pre-term labor, unspecified trimester: Secondary | ICD-10-CM

## 2011-01-04 DIAGNOSIS — O169 Unspecified maternal hypertension, unspecified trimester: Secondary | ICD-10-CM

## 2011-01-04 DIAGNOSIS — IMO0002 Reserved for concepts with insufficient information to code with codable children: Secondary | ICD-10-CM

## 2011-01-04 DIAGNOSIS — M359 Systemic involvement of connective tissue, unspecified: Secondary | ICD-10-CM

## 2011-01-04 DIAGNOSIS — Z331 Pregnant state, incidental: Secondary | ICD-10-CM

## 2011-01-04 DIAGNOSIS — O3410 Maternal care for benign tumor of corpus uteri, unspecified trimester: Secondary | ICD-10-CM

## 2011-01-04 LAB — POCT URINALYSIS DIP (DEVICE)
Bilirubin Urine: NEGATIVE
Glucose, UA: NEGATIVE mg/dL
Hgb urine dipstick: NEGATIVE
Ketones, ur: NEGATIVE mg/dL
Nitrite: NEGATIVE
Protein, ur: NEGATIVE mg/dL
Specific Gravity, Urine: 1.02 (ref 1.005–1.030)
Urobilinogen, UA: 1 mg/dL (ref 0.0–1.0)
pH: 7 (ref 5.0–8.0)

## 2011-01-09 ENCOUNTER — Other Ambulatory Visit: Payer: Self-pay | Admitting: Obstetrics and Gynecology

## 2011-01-09 ENCOUNTER — Ambulatory Visit (HOSPITAL_COMMUNITY)
Admission: RE | Admit: 2011-01-09 | Discharge: 2011-01-09 | Disposition: A | Discharge status: Home / Self Care | Payer: BLUE CROSS/BLUE SHIELD | Source: Ambulatory Visit | Attending: Obstetrics and Gynecology | Admitting: Obstetrics and Gynecology

## 2011-01-09 DIAGNOSIS — O10019 Pre-existing essential hypertension complicating pregnancy, unspecified trimester: Secondary | ICD-10-CM | POA: Insufficient documentation

## 2011-01-09 DIAGNOSIS — O09529 Supervision of elderly multigravida, unspecified trimester: Secondary | ICD-10-CM | POA: Insufficient documentation

## 2011-01-09 DIAGNOSIS — O09299 Supervision of pregnancy with other poor reproductive or obstetric history, unspecified trimester: Secondary | ICD-10-CM | POA: Insufficient documentation

## 2011-01-09 DIAGNOSIS — R772 Abnormality of alphafetoprotein: Secondary | ICD-10-CM

## 2011-01-09 DIAGNOSIS — O3410 Maternal care for benign tumor of corpus uteri, unspecified trimester: Secondary | ICD-10-CM | POA: Insufficient documentation

## 2011-01-09 DIAGNOSIS — E669 Obesity, unspecified: Secondary | ICD-10-CM | POA: Insufficient documentation

## 2011-01-11 ENCOUNTER — Other Ambulatory Visit: Payer: Self-pay | Admitting: Obstetrics and Gynecology

## 2011-01-11 DIAGNOSIS — O09299 Supervision of pregnancy with other poor reproductive or obstetric history, unspecified trimester: Secondary | ICD-10-CM

## 2011-01-11 DIAGNOSIS — O99019 Anemia complicating pregnancy, unspecified trimester: Secondary | ICD-10-CM

## 2011-01-11 DIAGNOSIS — O169 Unspecified maternal hypertension, unspecified trimester: Secondary | ICD-10-CM

## 2011-01-11 DIAGNOSIS — M359 Systemic involvement of connective tissue, unspecified: Secondary | ICD-10-CM

## 2011-01-11 DIAGNOSIS — IMO0002 Reserved for concepts with insufficient information to code with codable children: Secondary | ICD-10-CM

## 2011-01-11 DIAGNOSIS — O3410 Maternal care for benign tumor of corpus uteri, unspecified trimester: Secondary | ICD-10-CM

## 2011-01-11 LAB — POCT URINALYSIS DIP (DEVICE)
Glucose, UA: NEGATIVE mg/dL
Hgb urine dipstick: NEGATIVE
Nitrite: NEGATIVE
Protein, ur: 30 mg/dL — AB
Specific Gravity, Urine: 1.025 (ref 1.005–1.030)
Urobilinogen, UA: 0.2 mg/dL (ref 0.0–1.0)
pH: 5.5 (ref 5.0–8.0)

## 2011-01-15 ENCOUNTER — Other Ambulatory Visit: Payer: BLUE CROSS/BLUE SHIELD

## 2011-01-15 DIAGNOSIS — Z0189 Encounter for other specified special examinations: Secondary | ICD-10-CM

## 2011-01-18 ENCOUNTER — Other Ambulatory Visit: Payer: Self-pay | Admitting: Physician Assistant

## 2011-01-18 DIAGNOSIS — O09219 Supervision of pregnancy with history of pre-term labor, unspecified trimester: Secondary | ICD-10-CM

## 2011-01-18 DIAGNOSIS — O169 Unspecified maternal hypertension, unspecified trimester: Secondary | ICD-10-CM

## 2011-01-18 DIAGNOSIS — O365990 Maternal care for other known or suspected poor fetal growth, unspecified trimester, not applicable or unspecified: Secondary | ICD-10-CM

## 2011-01-18 LAB — POCT URINALYSIS DIP (DEVICE)
Glucose, UA: NEGATIVE mg/dL
Hgb urine dipstick: NEGATIVE
Nitrite: NEGATIVE
Protein, ur: 30 mg/dL — AB
Specific Gravity, Urine: >=1.03 (ref 1.005–1.030)
Urobilinogen, UA: 1 mg/dL (ref 0.0–1.0)
pH: 5.5 (ref 5.0–8.0)

## 2011-01-23 ENCOUNTER — Inpatient Hospital Stay (HOSPITAL_COMMUNITY)
Admission: AD | Admit: 2011-01-23 | Discharge: 2011-01-25 | DRG: 372 | Disposition: A | Discharge status: Home / Self Care | Payer: BC Managed Care – PPO | Source: Ambulatory Visit | Attending: Obstetrics & Gynecology | Admitting: Obstetrics & Gynecology

## 2011-01-23 ENCOUNTER — Encounter (HOSPITAL_COMMUNITY): Payer: Self-pay | Admitting: *Deleted

## 2011-01-23 ENCOUNTER — Ambulatory Visit (HOSPITAL_COMMUNITY)
Admission: RE | Admit: 2011-01-23 | Discharge: 2011-01-23 | Disposition: A | Discharge status: Home / Self Care | Payer: BC Managed Care – PPO | Source: Ambulatory Visit | Attending: Obstetrics and Gynecology | Admitting: Obstetrics and Gynecology

## 2011-01-23 DIAGNOSIS — O09299 Supervision of pregnancy with other poor reproductive or obstetric history, unspecified trimester: Secondary | ICD-10-CM | POA: Insufficient documentation

## 2011-01-23 DIAGNOSIS — O09529 Supervision of elderly multigravida, unspecified trimester: Secondary | ICD-10-CM | POA: Insufficient documentation

## 2011-01-23 DIAGNOSIS — O1002 Pre-existing essential hypertension complicating childbirth: Secondary | ICD-10-CM | POA: Diagnosis present

## 2011-01-23 DIAGNOSIS — O99892 Other specified diseases and conditions complicating childbirth: Secondary | ICD-10-CM | POA: Diagnosis present

## 2011-01-23 DIAGNOSIS — R772 Abnormality of alphafetoprotein: Secondary | ICD-10-CM

## 2011-01-23 DIAGNOSIS — O364XX0 Maternal care for intrauterine death, not applicable or unspecified: Principal | ICD-10-CM | POA: Diagnosis present

## 2011-01-23 DIAGNOSIS — E669 Obesity, unspecified: Secondary | ICD-10-CM | POA: Insufficient documentation

## 2011-01-23 DIAGNOSIS — M329 Systemic lupus erythematosus, unspecified: Secondary | ICD-10-CM | POA: Diagnosis present

## 2011-01-23 DIAGNOSIS — O3410 Maternal care for benign tumor of corpus uteri, unspecified trimester: Secondary | ICD-10-CM | POA: Insufficient documentation

## 2011-01-23 DIAGNOSIS — O10019 Pre-existing essential hypertension complicating pregnancy, unspecified trimester: Secondary | ICD-10-CM | POA: Insufficient documentation

## 2011-01-23 LAB — CBC
HCT: 33.2 % — ABNORMAL LOW (ref 36.0–46.0)
Hemoglobin: 11.1 g/dL — ABNORMAL LOW (ref 12.0–15.0)
MCH: 25.3 pg — ABNORMAL LOW (ref 26.0–34.0)
MCHC: 33.4 g/dL (ref 30.0–36.0)
MCV: 75.8 fL — ABNORMAL LOW (ref 78.0–100.0)
Platelets: 265 10*3/uL (ref 150–400)
RBC: 4.38 MIL/uL (ref 3.87–5.11)
RDW: 15 % (ref 11.5–15.5)
WBC: 5.3 10*3/uL (ref 4.0–10.5)

## 2011-01-24 ENCOUNTER — Other Ambulatory Visit: Payer: Self-pay | Admitting: Obstetrics & Gynecology

## 2011-01-24 DIAGNOSIS — O1002 Pre-existing essential hypertension complicating childbirth: Secondary | ICD-10-CM

## 2011-01-24 DIAGNOSIS — O9989 Other specified diseases and conditions complicating pregnancy, childbirth and the puerperium: Secondary | ICD-10-CM

## 2011-01-24 DIAGNOSIS — O364XX0 Maternal care for intrauterine death, not applicable or unspecified: Secondary | ICD-10-CM

## 2011-01-24 DIAGNOSIS — O99892 Other specified diseases and conditions complicating childbirth: Secondary | ICD-10-CM

## 2011-01-24 DIAGNOSIS — O09529 Supervision of elderly multigravida, unspecified trimester: Secondary | ICD-10-CM

## 2011-01-25 ENCOUNTER — Ambulatory Visit: Payer: BLUE CROSS/BLUE SHIELD

## 2011-01-25 LAB — CBC
HCT: 31.2 % — ABNORMAL LOW (ref 36.0–46.0)
Hemoglobin: 10.4 g/dL — ABNORMAL LOW (ref 12.0–15.0)
MCH: 25.5 pg — ABNORMAL LOW (ref 26.0–34.0)
MCHC: 33.3 g/dL (ref 30.0–36.0)
MCV: 76.5 fL — ABNORMAL LOW (ref 78.0–100.0)
Platelets: 244 10*3/uL (ref 150–400)
RBC: 4.08 MIL/uL (ref 3.87–5.11)
RDW: 15 % (ref 11.5–15.5)
WBC: 4.9 10*3/uL (ref 4.0–10.5)

## 2011-02-05 ENCOUNTER — Ambulatory Visit: Payer: BC Managed Care – PPO | Admitting: Obstetrics and Gynecology

## 2011-02-05 DIAGNOSIS — I10 Essential (primary) hypertension: Secondary | ICD-10-CM

## 2011-02-06 NOTE — Group Therapy Note (Signed)
[name], ABDELRAHMAN NO.:  [id number]  MEDICAL RECORD NO.:  [id number]           PATIENT TYPE:  A  LOCATION:  [facility]                   FACILITY:  [facility]  PHYSICIAN:  Argentina Donovan, MD        DATE OF BIRTH:  23-Mar-1976  DATE OF SERVICE:  02/05/2011                                 CLINIC NOTE  The patient is a 35 year old African American female gravida 5, para 1-1- 3-2, who delivered stillborn baby at 23-1/2 weeks on January 24, 2011.  She is in for her immediate postpartum exam.  Postnatal depression scale showed a 20 RR.  There is no risk of suicide ideation and the social worker, Rutherford Nail, tied up in a meeting, so I am going to make an appointment to have her see.  She wants Depo-Provera for birth control which could not be given while she was in the hospital, that will be given today.  She has many problems, among which is obesity, 218 pounds and 5 feet tall.  She has lupus erythematosus and chronic hypertension. Today, her blood pressures is 209/119, 199/115, 182/110.  She has been on labetalol and Norvasc.  I am going to switch her to Diovan 360 and keep her on the Norvasc and we will schedule her to come in for repeat colposcopy which she is due for postpartum about 4 weeks when she should be about the time for her 6-week checkup.  She has sickle cell trait in addition to all of these things.  She is going to make her appointment to go back to [facility], but is doing poorly in a grieving period.  She has no physical complaints being only 10 days postpartum.  IMPRESSION:  Probable postpartum depression and told that it was a grieving period and the fact that she had arrangements for this young baby, stillborn, has been in a stress from her lupus erythematosus, chronic hypertension, Rx Diovan 360 mg, Norvasc 5 mg.          ______________________________ Argentina Donovan, MD    PR/MEDQ  D:  02/05/2011  T:  02/06/2011  Job:  [id number]

## 2011-02-11 NOTE — Discharge Summary (Signed)
  [name], [name]             ACCOUNT NO.:  [id number]  MEDICAL RECORD NO.:  [id number]  LOCATION:  9312                          [facility]  PHYSICIAN:  Horton Chin, MD DATE OF BIRTH:  1976-02-13  DATE OF ADMISSION:  01/23/2011 DATE OF DISCHARGE:  01/25/2011                              DISCHARGE SUMMARY   DISCHARGE DIAGNOSES: 1. Intrauterine fetal demise. 2. Induction of labor. 3. Chronic hypertension, poorly controlled. 4. Systemic lupus.  DISCHARGE MEDICATIONS: 1. Prenatal vitamin 1 tablet daily. 2. Norvasc 5 mg by mouth daily. 3. Labetalol 300 mg by mouth twice daily. 4. Hydrochlorothiazide 25 mg by mouth daily.  BRIEF HOSPITAL COURSE:  The patient is a 35 year old G5, P2-0-3-2, who presented at 23 weeks from [facility] where she was being seen for a routine ultrasound due to her previous miscarriages.  The patient was found to have absent fetal heart rate and was admitted for induction of labor.  During the induction, the patient was noted to have somewhat labile blood pressures with systolic to 190s, diastolic to 115s.  The patient delivered at 7:20 a.m. on January 24, 2011.  Due to the patient's elevated blood pressures, it was decided to keep the patient over night.  The patient's blood pressure medications were adjusted, and the patient had the blood pressure in the 130 systolic over 70 diastolic at the time of discharge.  DISCHARGE INSTRUCTIONS:  The patient was discharged home with instructions to refrain from sexual activity for 6 weeks, no dietary restrictions, and instructions to follow up in [facility] in 2 weeks.  The patient is to have a Pap smear performed in 6 months.  The patient was also instructed to contact her primary care provider for an appointment sometime in the next 2-3 weeks to discuss continued blood pressure management once out of the postpartum period.  The patient was told that, if she began  to experience problems with dizziness or other signs of orthostasis, she was to cut her daily labetalol in half and contact office if this occurs prior to her postpartum check.  DISCHARGE CONDITION:  The patient was discharged home in stable condition.    ______________________________ Majel Homer, MD   ______________________________ Horton Chin, MD    ER/MEDQ  D:  01/25/2011  T:  01/31/2011  Job:  [id number]  Electronically Signed by Manuela Neptune MD on 01/27/2011 12:07:40 PM Electronically Signed by Jaynie Collins MD on 02/11/2011 09:58:04 AM

## 2011-02-11 DEATH — deceased

## 2011-02-23 ENCOUNTER — Inpatient Hospital Stay (INDEPENDENT_AMBULATORY_CARE_PROVIDER_SITE_OTHER)
Admission: RE | Admit: 2011-02-23 | Discharge: 2011-02-23 | Disposition: A | Discharge status: Home / Self Care | Payer: BC Managed Care – PPO | Source: Ambulatory Visit | Attending: Family Medicine | Admitting: Family Medicine

## 2011-02-23 DIAGNOSIS — J069 Acute upper respiratory infection, unspecified: Secondary | ICD-10-CM

## 2011-02-23 DIAGNOSIS — J029 Acute pharyngitis, unspecified: Secondary | ICD-10-CM

## 2011-02-23 LAB — POCT RAPID STREP A: Streptococcus, Group A Screen (Direct): NEGATIVE

## 2011-03-07 ENCOUNTER — Encounter: Payer: Self-pay | Admitting: Obstetrics and Gynecology

## 2011-03-07 ENCOUNTER — Ambulatory Visit (INDEPENDENT_AMBULATORY_CARE_PROVIDER_SITE_OTHER): Payer: BC Managed Care – PPO | Admitting: Obstetrics and Gynecology

## 2011-03-07 VITALS — BP 200/110 | HR 85 | Temp 98.8°F | Ht 60.0 in | Wt 203.2 lb

## 2011-03-07 DIAGNOSIS — I10 Essential (primary) hypertension: Secondary | ICD-10-CM

## 2011-03-07 DIAGNOSIS — IMO0002 Reserved for concepts with insufficient information to code with codable children: Secondary | ICD-10-CM

## 2011-03-07 DIAGNOSIS — R87612 Low grade squamous intraepithelial lesion on cytologic smear of cervix (LGSIL): Secondary | ICD-10-CM

## 2011-03-07 DIAGNOSIS — O3440 Maternal care for other abnormalities of cervix, unspecified trimester: Secondary | ICD-10-CM

## 2011-03-07 MED ORDER — AMLODIPINE BESYLATE 10 MG PO TABS: 10.0000 mg | ORAL_TABLET | Freq: Every day | ORAL | Status: DC

## 2011-03-07 NOTE — Progress Notes (Signed)
This patient is a 35 year old African American female gravida 5 para 1132 and delivered a stillborn baby at 68 weeks on June 13. She was previously seen in the immediate postpartum period and doing poorly in agreement. That is didn't improve significantly. As she is in today for her 6 week examination. Her blood pressure today is very high at 200/119. She never filled the prescription for blood pressure though we gave her several weeks until because of cost. Will start her on Norvasc 10 mg a day today and she is to follow up with her primary care physician next week. Her primary care doctor also will have to refer her to a rheumatologist as her previous one has refused to see her. During her pregnancy the patient had chronic hypertension sickle cell trait, obesity, and a prior history of early stillborn. She had previous Pap smear in March of this year the result was LS I L. She had a colposcopy which showed some areas of dysplasia no biopsies were taken so Pap smear will be repeated today.

## 2011-03-07 NOTE — Progress Notes (Signed)
Pt was given rx for Diovan has not taken yet.

## 2011-03-09 ENCOUNTER — Telehealth: Payer: Self-pay | Admitting: *Deleted

## 2011-03-09 NOTE — Telephone Encounter (Signed)
Pt wants to return to work prior to 03/15/11.  I spoke w/pt today. We discussed the need to let the new BP med take effect since her BP was quite high. Pt voiced understanding. She confirmed that she has started the med.  I offered to speak to Dr. Okey Dupre on Monday 03/12/11 about her request. Pt stated that she will just wait to return to work on 03/15/11 as planned. No further questions or problems @ present.

## 2011-03-15 ENCOUNTER — Encounter: Payer: BC Managed Care – PPO | Admitting: Family Medicine

## 2011-04-18 ENCOUNTER — Encounter: Payer: BC Managed Care – PPO | Admitting: Family Medicine

## 2011-05-15 LAB — POCT I-STAT, CHEM 8
BUN: 11
Calcium, Ion: 1.37 — ABNORMAL HIGH
Chloride: 105
Creatinine, Ser: 0.7
Glucose, Bld: 89
HCT: 32 — ABNORMAL LOW
Hemoglobin: 10.9 — ABNORMAL LOW
Potassium: 3.8
Sodium: 142
TCO2: 28

## 2011-05-15 LAB — POCT URINALYSIS DIP (DEVICE)
Bilirubin Urine: NEGATIVE
Glucose, UA: NEGATIVE
Hgb urine dipstick: NEGATIVE
Ketones, ur: NEGATIVE
Nitrite: NEGATIVE
Operator id: 239701
Protein, ur: NEGATIVE
Specific Gravity, Urine: 1.01
Urobilinogen, UA: 1
pH: 6.5

## 2011-05-15 LAB — POCT PREGNANCY, URINE: Preg Test, Ur: NEGATIVE

## 2011-05-16 LAB — CBC
HCT: 28.2 — ABNORMAL LOW
Hemoglobin: 9.1 — ABNORMAL LOW
MCHC: 32.2
MCV: 68.6 — ABNORMAL LOW
Platelets: 330
RBC: 4.11
RDW: 19.3 — ABNORMAL HIGH
WBC: 3.4 — ABNORMAL LOW

## 2011-05-16 LAB — URINE MICROSCOPIC-ADD ON

## 2011-05-16 LAB — URINALYSIS, ROUTINE W REFLEX MICROSCOPIC
Glucose, UA: NEGATIVE
Ketones, ur: 15 — AB
Leukocytes, UA: NEGATIVE
Nitrite: NEGATIVE
Protein, ur: 30 — AB
Specific Gravity, Urine: 1.02
Urobilinogen, UA: 1
pH: 6.5

## 2011-05-16 LAB — POCT PREGNANCY, URINE: Preg Test, Ur: NEGATIVE

## 2011-06-28 ENCOUNTER — Encounter (HOSPITAL_COMMUNITY): Payer: Self-pay | Admitting: Nurse Practitioner

## 2011-06-28 ENCOUNTER — Emergency Department (HOSPITAL_COMMUNITY)
Admission: EM | Admit: 2011-06-28 | Discharge: 2011-06-28 | Disposition: A | Discharge status: Home / Self Care | Payer: Self-pay | Attending: Emergency Medicine | Admitting: Emergency Medicine

## 2011-06-28 DIAGNOSIS — R5381 Other malaise: Secondary | ICD-10-CM | POA: Insufficient documentation

## 2011-06-28 DIAGNOSIS — I10 Essential (primary) hypertension: Secondary | ICD-10-CM

## 2011-06-28 DIAGNOSIS — N390 Urinary tract infection, site not specified: Secondary | ICD-10-CM

## 2011-06-28 DIAGNOSIS — Z79899 Other long term (current) drug therapy: Secondary | ICD-10-CM | POA: Insufficient documentation

## 2011-06-28 DIAGNOSIS — M329 Systemic lupus erythematosus, unspecified: Secondary | ICD-10-CM | POA: Insufficient documentation

## 2011-06-28 DIAGNOSIS — M79609 Pain in unspecified limb: Secondary | ICD-10-CM | POA: Insufficient documentation

## 2011-06-28 DIAGNOSIS — R109 Unspecified abdominal pain: Secondary | ICD-10-CM | POA: Insufficient documentation

## 2011-06-28 DIAGNOSIS — R3915 Urgency of urination: Secondary | ICD-10-CM | POA: Insufficient documentation

## 2011-06-28 DIAGNOSIS — R350 Frequency of micturition: Secondary | ICD-10-CM | POA: Insufficient documentation

## 2011-06-28 LAB — URINALYSIS, ROUTINE W REFLEX MICROSCOPIC
Bilirubin Urine: NEGATIVE
Glucose, UA: NEGATIVE mg/dL
Hgb urine dipstick: NEGATIVE
Ketones, ur: NEGATIVE mg/dL
Nitrite: NEGATIVE
Protein, ur: NEGATIVE mg/dL
Specific Gravity, Urine: 1.016 (ref 1.005–1.030)
Urobilinogen, UA: 1 mg/dL (ref 0.0–1.0)
pH: 6 (ref 5.0–8.0)

## 2011-06-28 LAB — URINE MICROSCOPIC-ADD ON

## 2011-06-28 MED ORDER — AMLODIPINE BESYLATE 10 MG PO TABS
10.0000 mg | ORAL_TABLET | Freq: Once | ORAL | Status: AC
  Administered 2011-06-28: 10 mg via ORAL
  Filled 2011-06-28: qty 1

## 2011-06-28 MED ORDER — CIPROFLOXACIN HCL 500 MG PO TABS: 500.0000 mg | ORAL_TABLET | Freq: Two times a day (BID) | ORAL | Status: AC

## 2011-06-28 MED ORDER — CIPROFLOXACIN HCL 500 MG PO TABS
500.0000 mg | ORAL_TABLET | Freq: Once | ORAL | Status: AC
  Administered 2011-06-28: 500 mg via ORAL
  Filled 2011-06-28: qty 1

## 2011-06-28 NOTE — ED Notes (Signed)
C/o leg and lower back pain, urinary frequency onset last night. Denies fevers or abd pain

## 2011-06-28 NOTE — ED Notes (Signed)
States she has been out of amlodipine since Saturday, is able to get refill tomorrow had to save money. Denies any cp, sob, dizziness.

## 2011-06-28 NOTE — ED Provider Notes (Signed)
History     CSN: [id number] Arrival date & time: 06/28/2011 10:43 AM   First MD Initiated Contact with Patient 06/28/11 1059      Chief Complaint  Patient presents with  . Urinary Tract Infection    (Consider location/radiation/quality/duration/timing/severity/associated sxs/prior treatment) HPI Comments: Pt also HTN here and has been out of amlodipine for 5 days.  Denies SOB, CP or headaches.  Patient is a 35 y.o. female presenting with urinary tract infection. The history is provided by the patient.  Urinary Tract Infection This is a new problem. The current episode started more than 2 days ago. The problem occurs constantly. The problem has been gradually worsening. Pertinent negatives include no chest pain, no abdominal pain, no headaches and no shortness of breath. Associated symptoms comments: Flank pain worse on the right and aching in the legs bilaterally. Exacerbated by: urinating. The symptoms are relieved by nothing. She has tried nothing for the symptoms. The treatment provided no relief.    Past Medical History  Diagnosis Date  . Anemia   . Enlarged heart     managed by cardiology  . Hypertension   . Lupus 1999  . Allergy     History reviewed. No pertinent past surgical history.  Family History  Problem Relation Age of Onset  . Diabetes Mother   . Diabetes Maternal Uncle   . Hypertension Maternal Grandmother     History  Substance Use Topics  . Smoking status: Never Smoker   . Smokeless tobacco: Never Used  . Alcohol Use: Yes     rarely    OB History    Grav Para Term Preterm Abortions TAB SAB Ect Mult Living   5 2 0 2 3 1 2 0 0 2       Review of Systems  Constitutional: Positive for fatigue. Negative for fever.  Respiratory: Negative for shortness of breath.   Cardiovascular: Negative for chest pain.  Gastrointestinal: Negative for nausea, vomiting, abdominal pain and diarrhea.  Genitourinary: Positive for urgency and frequency. Negative for  dysuria, vaginal bleeding and vaginal discharge.  Neurological: Negative for weakness, numbness and headaches.  All other systems reviewed and are negative.    Allergies  Septra  Home Medications   Current Outpatient Rx  Name Route Sig Dispense Refill  . AMLODIPINE BESYLATE 10 MG PO TABS Oral Take 1 tablet (10 mg total) by mouth daily. 30 tablet 11  . LABETALOL HCL 300 MG PO TABS Oral Take 300 mg by mouth 2 (two) times daily.      Marland Kitchen MEDROXYPROGESTERONE ACETATE 150 MG/ML IM SUSP Intramuscular Inject 150 mg into the muscle every 3 (three) months.        BP 211/102  Pulse 80  Temp(Src) 98.2 F (36.8 C) (Oral)  Resp 18  SpO2 98%  LMP 03/05/2011  Physical Exam  Nursing note and vitals reviewed. Constitutional: She is oriented to person, place, and time. She appears well-developed and well-nourished. No distress.  HENT:  Head: Normocephalic and atraumatic.  Eyes: EOM are normal. Pupils are equal, round, and reactive to light.  Cardiovascular: Normal rate, regular rhythm, normal heart sounds and intact distal pulses.  Exam reveals no friction rub.   No murmur heard. Pulmonary/Chest: Effort normal and breath sounds normal. She has no wheezes. She has no rales.  Abdominal: Soft. Bowel sounds are normal. She exhibits no distension. There is no tenderness. There is CVA tenderness. There is no rebound and no guarding.  Musculoskeletal: Normal range of motion. She exhibits  no tenderness.       No edema and mild tenderness when palpating bilateral thighs  Neurological: She is alert and oriented to person, place, and time. No cranial nerve deficit.  Skin: Skin is warm and dry. No rash noted.  Psychiatric: She has a normal mood and affect. Her behavior is normal.    ED Course  Procedures (including critical care time)  Labs Reviewed  URINALYSIS, ROUTINE W REFLEX MICROSCOPIC - Abnormal; Notable for the following:    Appearance HAZY (*)    Leukocytes, UA MODERATE (*)    All other  components within normal limits  URINE MICROSCOPIC-ADD ON - Abnormal; Notable for the following:    Squamous Epithelial / LPF FEW (*)    Bacteria, UA FEW (*)    All other components within normal limits   No results found.   No diagnosis found.    MDM   Pt hx with sx consistent with UTI with frequency, urgency and pressure with foul smelling urine.  States she has lupus and since the above sx last night started getting muscle aches but no joint swelling and no rashes.  Also no sx suggestive of lumbar etiology.  No missed menses and UA pending. Also pt HTN here but off amlodipine for 5 days and does have a refill.  Also denies CP, SOB, HA or other end organ damage.  Will give dose of amlodipine and will recheck BP.  Pt will f/u with PCP and get her meds refilled.  UA suggestive of UTI.  Will treat with cipro.  Repeat BP was unchanged.  Pt given her amlodipine here and will get hers filled.     Gwyneth Sprout, MD 06/28/11 [phone]

## 2011-06-28 NOTE — ED Notes (Signed)
Pt presents with onset of frequent urination x "a few days".  Pt denies any dysuria, reports pressure when she voids.  Pt reports after she voids, it still felt like she had to void.

## 2011-11-15 ENCOUNTER — Emergency Department (INDEPENDENT_AMBULATORY_CARE_PROVIDER_SITE_OTHER)
Admission: EM | Admit: 2011-11-15 | Discharge: 2011-11-15 | Disposition: A | Discharge status: Home / Self Care | Payer: Self-pay | Source: Home / Self Care | Attending: Emergency Medicine | Admitting: Emergency Medicine

## 2011-11-15 ENCOUNTER — Encounter (HOSPITAL_COMMUNITY): Payer: Self-pay | Admitting: Emergency Medicine

## 2011-11-15 DIAGNOSIS — N390 Urinary tract infection, site not specified: Secondary | ICD-10-CM

## 2011-11-15 LAB — POCT URINALYSIS DIP (DEVICE)
Bilirubin Urine: NEGATIVE
Glucose, UA: NEGATIVE mg/dL
Hgb urine dipstick: NEGATIVE
Ketones, ur: NEGATIVE mg/dL
Nitrite: NEGATIVE
Protein, ur: NEGATIVE mg/dL
Specific Gravity, Urine: 1.015 (ref 1.005–1.030)
Urobilinogen, UA: 1 mg/dL (ref 0.0–1.0)
pH: 6.5 (ref 5.0–8.0)

## 2011-11-15 LAB — POCT PREGNANCY, URINE: Preg Test, Ur: NEGATIVE

## 2011-11-15 MED ORDER — CEPHALEXIN 500 MG PO CAPS: 500.0000 mg | ORAL_CAPSULE | Freq: Three times a day (TID) | ORAL | Status: AC

## 2011-11-15 NOTE — Discharge Instructions (Signed)
Urinary Tract Infection A urinary tract infection (UTI) is often caused by a germ (bacteria). A UTI is usually helped with medicine (antibiotics) that kills germs. Take all the medicine until it is gone. Do this even if you are feeling better. You are usually better in 7 to 10 days. HOME CARE   Drink enough water and fluids to keep your pee (urine) clear or pale yellow. Drink:   Cranberry juice.   Water.   Avoid:   Caffeine.   Tea.   Bubbly (carbonated) drinks.   Alcohol.   Only take medicine as told by your doctor.   To prevent further infections:   Pee often.   After pooping (bowel movement), women should wipe from front to back. Use each tissue only once.   Pee before and after having sex (intercourse).  Ask your doctor when your test results will be ready. Make sure you follow up and get your test results.  GET HELP RIGHT AWAY IF:   There is very bad back pain or lower belly (abdominal) pain.   You get the chills.   You have a fever.   Your baby is older than 3 months with a rectal temperature of 102 F (38.9 C) or higher.   Your baby is 3 months old or younger with a rectal temperature of 100.4 F (38 C) or higher.   You feel sick to your stomach (nauseous) or throw up (vomit).   There is continued burning with peeing.   Your problems are not better in 3 days. Return sooner if you are getting worse.  MAKE SURE YOU:   Understand these instructions.   Will watch your condition.   Will get help right away if you are not doing well or get worse.  Document Released: 01/16/2008 Document Revised: 07/19/2011 Document Reviewed: 01/16/2008 [employer] Patient Information 2012 [employer]. 

## 2011-11-15 NOTE — ED Notes (Signed)
PT HERE WITH SX FREQ/URGE AND DYSURIA THAT STARTED LAST WEEK.DENIES BURN,ITCH OR VAG D/C.PT STATES  I HAVE HX UTI'S  FREQUENTLY.

## 2011-11-15 NOTE — ED Provider Notes (Signed)
History     CSN: [id number]  Arrival date & time 11/15/11  1436   First MD Initiated Contact with Patient 11/15/11 1523      Chief Complaint  Patient presents with  . Urinary Tract Infection    (Consider location/radiation/quality/duration/timing/severity/associated sxs/prior treatment) HPI Comments: Patient presents urgent care today complaining of increased frequency and urgency discomfort when she urinates describes as "pressure". No vaginal discharge, no vaginal bleeding. Have had other urinary tract infections and was 2-3 years remembers they have all been treated with Cipro.  Patient denies any abdominal pain, flank pain, fever, nausea and or vomiting  Patient is a 36 y.o. female presenting with urinary tract infection. The history is provided by the patient.  Urinary Tract Infection This is a new problem. The current episode started more than 2 days ago. The problem occurs constantly. The problem has not changed since onset.Pertinent negatives include no abdominal pain and no shortness of breath. Exacerbated by: urination. She has tried nothing for the symptoms.    Past Medical History  Diagnosis Date  . Anemia   . Enlarged heart     managed by cardiology  . Hypertension   . Lupus 1999  . Allergy     History reviewed. No pertinent past surgical history.  Family History  Problem Relation Age of Onset  . Diabetes Mother   . Diabetes Maternal Uncle   . Hypertension Maternal Grandmother     History  Substance Use Topics  . Smoking status: Never Smoker   . Smokeless tobacco: Never Used  . Alcohol Use: Yes     rarely    OB History    Grav Para Term Preterm Abortions TAB SAB Ect Mult Living   5 2 0 2 3 1 2 0 0 2       Review of Systems  Respiratory: Negative for shortness of breath.   Gastrointestinal: Negative for abdominal pain.    Allergies  Septra  Home Medications   Current Outpatient Rx  Name Route Sig Dispense Refill  . AMLODIPINE BESYLATE 10  MG PO TABS Oral Take 1 tablet (10 mg total) by mouth daily. 30 tablet 11  . CEPHALEXIN 500 MG PO CAPS Oral Take 1 capsule (500 mg total) by mouth 3 (three) times daily. 21 capsule 0  . LABETALOL HCL 300 MG PO TABS Oral Take 300 mg by mouth 2 (two) times daily.      Marland Kitchen MEDROXYPROGESTERONE ACETATE 150 MG/ML IM SUSP Intramuscular Inject 150 mg into the muscle every 3 (three) months.        BP 153/93  Pulse 96  Temp(Src) 98.3 F (36.8 C) (Oral)  Resp 14  SpO2 100%  LMP 03/05/2011  Physical Exam  ED Course  Procedures (including critical care time)  Labs Reviewed  POCT URINALYSIS DIP (DEVICE) - Abnormal; Notable for the following:    Leukocytes, UA MODERATE (*) Biochemical Testing Only. Please order routine urinalysis from main lab if confirmatory testing is needed.   All other components within normal limits  POCT PREGNANCY, URINE   No results found.   1. Urinary tract infection       MDM  dysuria 3 days, mild leukocyturia        Jimmie Molly, MD 11/15/11 [phone]

## 2012-03-31 ENCOUNTER — Other Ambulatory Visit: Payer: Self-pay

## 2012-03-31 ENCOUNTER — Emergency Department (HOSPITAL_COMMUNITY): Payer: Self-pay

## 2012-03-31 ENCOUNTER — Emergency Department (HOSPITAL_COMMUNITY)
Admission: EM | Admit: 2012-03-31 | Discharge: 2012-03-31 | Disposition: A | Discharge status: Home / Self Care | Payer: Self-pay | Attending: Emergency Medicine | Admitting: Emergency Medicine

## 2012-03-31 ENCOUNTER — Encounter (HOSPITAL_COMMUNITY): Payer: Self-pay | Admitting: Emergency Medicine

## 2012-03-31 DIAGNOSIS — R51 Headache: Secondary | ICD-10-CM | POA: Insufficient documentation

## 2012-03-31 DIAGNOSIS — I10 Essential (primary) hypertension: Secondary | ICD-10-CM | POA: Insufficient documentation

## 2012-03-31 DIAGNOSIS — A879 Viral meningitis, unspecified: Secondary | ICD-10-CM | POA: Insufficient documentation

## 2012-03-31 DIAGNOSIS — R300 Dysuria: Secondary | ICD-10-CM | POA: Insufficient documentation

## 2012-03-31 LAB — GRAM STAIN

## 2012-03-31 LAB — CSF CELL COUNT WITH DIFFERENTIAL
Lymphs, CSF: 95 % — ABNORMAL HIGH (ref 40–80)
Monocyte-Macrophage-Spinal Fluid: 4 % — ABNORMAL LOW (ref 15–45)
RBC Count, CSF: 0 /mm3
Segmented Neutrophils-CSF: 1 % (ref 0–6)
Tube #: 3
WBC, CSF: 21 /mm3 (ref 0–5)

## 2012-03-31 LAB — BASIC METABOLIC PANEL
BUN: 13 mg/dL (ref 6–23)
CO2: 23 mEq/L (ref 19–32)
Calcium: 9.5 mg/dL (ref 8.4–10.5)
Chloride: 104 mEq/L (ref 96–112)
Creatinine, Ser: 0.53 mg/dL (ref 0.50–1.10)
GFR calc Af Amer: >90 mL/min (ref >90)
GFR calc non Af Amer: >90 mL/min (ref >90)
Glucose, Bld: 136 mg/dL — ABNORMAL HIGH (ref 70–99)
Potassium: 3.9 mEq/L (ref 3.5–5.1)
Sodium: 137 mEq/L (ref 135–145)

## 2012-03-31 LAB — URINALYSIS, ROUTINE W REFLEX MICROSCOPIC
Bilirubin Urine: NEGATIVE
Glucose, UA: NEGATIVE mg/dL
Hgb urine dipstick: NEGATIVE
Ketones, ur: NEGATIVE mg/dL
Leukocytes, UA: NEGATIVE
Nitrite: NEGATIVE
Protein, ur: NEGATIVE mg/dL
Specific Gravity, Urine: 1.015 (ref 1.005–1.030)
Urobilinogen, UA: 0.2 mg/dL (ref 0.0–1.0)
pH: 6.5 (ref 5.0–8.0)

## 2012-03-31 LAB — CBC WITH DIFFERENTIAL/PLATELET
Basophils Absolute: 0 10*3/uL (ref 0.0–0.1)
Basophils Relative: 0 % (ref 0–1)
Eosinophils Absolute: 0.1 10*3/uL (ref 0.0–0.7)
Eosinophils Relative: 3 % (ref 0–5)
HCT: 33.3 % — ABNORMAL LOW (ref 36.0–46.0)
Hemoglobin: 11.2 g/dL — ABNORMAL LOW (ref 12.0–15.0)
Lymphocytes Relative: 27 % (ref 12–46)
Lymphs Abs: 1.3 10*3/uL (ref 0.7–4.0)
MCH: 24.7 pg — ABNORMAL LOW (ref 26.0–34.0)
MCHC: 33.6 g/dL (ref 30.0–36.0)
MCV: 73.5 fL — ABNORMAL LOW (ref 78.0–100.0)
Monocytes Absolute: 0.4 10*3/uL (ref 0.1–1.0)
Monocytes Relative: 9 % (ref 3–12)
Neutro Abs: 3 10*3/uL (ref 1.7–7.7)
Neutrophils Relative %: 61 % (ref 43–77)
Platelets: 289 10*3/uL (ref 150–400)
RBC: 4.53 MIL/uL (ref 3.87–5.11)
RDW: 14.2 % (ref 11.5–15.5)
WBC: 4.8 10*3/uL (ref 4.0–10.5)

## 2012-03-31 LAB — PROTEIN AND GLUCOSE, CSF
Glucose, CSF: 65 mg/dL (ref 43–76)
Total  Protein, CSF: 35 mg/dL (ref 15–45)

## 2012-03-31 LAB — TROPONIN I: Troponin I: <0.3 ng/mL (ref <0.30)

## 2012-03-31 LAB — POCT PREGNANCY, URINE: Preg Test, Ur: NEGATIVE

## 2012-03-31 MED ORDER — AMLODIPINE BESYLATE 10 MG PO TABS
10.0000 mg | ORAL_TABLET | Freq: Once | ORAL | Status: AC
  Administered 2012-03-31: 10 mg via ORAL
  Filled 2012-03-31 (×3): qty 1

## 2012-03-31 MED ORDER — AMLODIPINE BESYLATE 10 MG PO TABS: 10.0000 mg | ORAL_TABLET | Freq: Every day | ORAL | Status: DC

## 2012-03-31 NOTE — ED Notes (Signed)
Pt c/o urinary frequency x 3 days; pt sts out of BP x 1 week and has rash on right hand

## 2012-03-31 NOTE — ED Provider Notes (Signed)
History  This chart was scribed for Glynn Octave, MD by Shari Heritage. The patient was seen in room CD11C/CD11C. Patient's care was started at 1236.     CSN: [id number]  Arrival date & time 03/31/12  1236   First MD Initiated Contact with Patient 03/31/12 1413      Chief Complaint  Patient presents with  . Urinary Tract Infection  . Rash  . Medication Refill    The history is provided by the patient. No language interpreter was used.   [name] is a 36 y.o. female with a history of HTN presents to the Emergency Department complaining of a sudden, moderate to severe HA onset 2-3 hours ago. Patient says that the pain has been constant since the onset, but is unchanged. She takes Norvasc to manage her HTN, but she ran out of her medication 8 days ago.  Patient's second complaint is urinary frequency onset 3 days ago. Patient denies abdominal pain, nausea or vomiting. She thinks that she may have a UTI. Patient's other medical history includes enlarged heart, lupus and anemia. She has never smoked.  Past Medical History  Diagnosis Date  . Anemia   . Enlarged heart     managed by cardiology  . Hypertension   . Lupus 1999  . Allergy     History reviewed. No pertinent past surgical history.  Family History  Problem Relation Age of Onset  . Diabetes Mother   . Diabetes Maternal Uncle   . Hypertension Maternal Grandmother     History  Substance Use Topics  . Smoking status: Never Smoker   . Smokeless tobacco: Never Used  . Alcohol Use: Yes     rarely    OB History    Grav Para Term Preterm Abortions TAB SAB Ect Mult Living   5 2 0 2 3 1 2 0 0 2       Review of Systems A complete 10 system review of systems was obtained and all systems are negative except as noted in the HPI and PMH.   Allergies  Septra  Home Medications   Current Outpatient Rx  Name Route Sig Dispense Refill  . AMLODIPINE BESYLATE 10 MG PO TABS Oral Take 10 mg by mouth daily.    Marland Kitchen  AMLODIPINE BESYLATE 10 MG PO TABS Oral Take 1 tablet (10 mg total) by mouth daily. 30 tablet 11    BP 190/111  Pulse 85  Temp 98.9 F (37.2 C) (Oral)  Resp 20  SpO2 100%  Physical Exam  Constitutional: She is oriented to person, place, and time. She appears well-developed and well-nourished.  HENT:  Head: Normocephalic and atraumatic.  Cardiovascular: Normal rate and regular rhythm.   Musculoskeletal: Normal range of motion.  Neurological: She is alert and oriented to person, place, and time.  Psychiatric: She has a normal mood and affect. Her behavior is normal.    ED Course  Procedures (including critical care time) DIAGNOSTIC STUDIES: Oxygen Saturation is 99% on room air, normal by my interpretation.    COORDINATION OF CARE: 2:18pm- Patient informed of current plan for treatment and evaluation and agrees with plan at this time. Will admininster 1 tablet of Norvasc 10 mg. Have ordered CT of head, lumbar puncture, CSF cell count, CSF culture, CBC, metabolic panel, troponin, and urinalysis. Requested patient be moved to CDU.  Results for orders placed during the hospital encounter of 03/31/12  URINALYSIS, ROUTINE W REFLEX MICROSCOPIC      Component Value Range  Color, Urine YELLOW  YELLOW   APPearance CLEAR  CLEAR   Specific Gravity, Urine 1.015  1.005 - 1.030   pH 6.5  5.0 - 8.0   Glucose, UA NEGATIVE  NEGATIVE mg/dL   Hgb urine dipstick NEGATIVE  NEGATIVE   Bilirubin Urine NEGATIVE  NEGATIVE   Ketones, ur NEGATIVE  NEGATIVE mg/dL   Protein, ur NEGATIVE  NEGATIVE mg/dL   Urobilinogen, UA 0.2  0.0 - 1.0 mg/dL   Nitrite NEGATIVE  NEGATIVE   Leukocytes, UA NEGATIVE  NEGATIVE  POCT PREGNANCY, URINE      Component Value Range   Preg Test, Ur NEGATIVE  NEGATIVE  CBC WITH DIFFERENTIAL      Component Value Range   WBC 4.8  4.0 - 10.5 K/uL   RBC 4.53  3.87 - 5.11 MIL/uL   Hemoglobin 11.2 (*) 12.0 - 15.0 g/dL   HCT 96.0 (*) 45.4 - 09.8 %   MCV 73.5 (*) 78.0 - 100.0 fL    MCH 24.7 (*) 26.0 - 34.0 pg   MCHC 33.6  30.0 - 36.0 g/dL   RDW 11.9  14.7 - 82.9 %   Platelets 289  150 - 400 K/uL   Neutrophils Relative 61  43 - 77 %   Lymphocytes Relative 27  12 - 46 %   Monocytes Relative 9  3 - 12 %   Eosinophils Relative 3  0 - 5 %   Basophils Relative 0  0 - 1 %   Neutro Abs 3.0  1.7 - 7.7 K/uL   Lymphs Abs 1.3  0.7 - 4.0 K/uL   Monocytes Absolute 0.4  0.1 - 1.0 K/uL   Eosinophils Absolute 0.1  0.0 - 0.7 K/uL   Basophils Absolute 0.0  0.0 - 0.1 K/uL   RBC Morphology POLYCHROMASIA PRESENT    BASIC METABOLIC PANEL      Component Value Range   Sodium 137  135 - 145 mEq/L   Potassium 3.9  3.5 - 5.1 mEq/L   Chloride 104  96 - 112 mEq/L   CO2 23  19 - 32 mEq/L   Glucose, Bld 136 (*) 70 - 99 mg/dL   BUN 13  6 - 23 mg/dL   Creatinine, Ser 5.62  0.50 - 1.10 mg/dL   Calcium 9.5  8.4 - 13.0 mg/dL   GFR calc non Af Amer >90  >90 mL/min   GFR calc Af Amer >90  >90 mL/min  TROPONIN I      Component Value Range   Troponin I <0.30  <0.30 ng/mL    No results found.   No diagnosis found.    MDM  Sudden onset of severe headache 2 hours ago. No visual changes, weakness, numbness, tingling.  Has not taken BP meds x 1 week.  Also with dysuria and frequency.  UA negative. HCG negative. Sudden onset headache that is severe.  Patient states thunderclap onset.  No neuro deficits.  No meningismus.  Will proceed with CT/LP and treat blood pressure.  D/w PAC West in CDU.    Date: 03/31/2012  Rate: 84  Rhythm: normal sinus rhythm  QRS Axis: normal  Intervals: normal  ST/T Wave abnormalities: normal  Conduction Disutrbances:none  Narrative Interpretation:   Old EKG Reviewed: unchanged    I personally performed the services described in this documentation, which was scribed in my presence.  The recorded information has been reviewed and considered.   Glynn Octave, MD 03/31/12 [phone]

## 2012-03-31 NOTE — Procedures (Signed)
Consent obtained.  Ct reviewed.  Order checked.  Time out performed.  Under fluoro guidance and aseptic technique, L3-4 LP performed with one pass 20g spinal needle.  OP - 17cc H20.  8cc clear CSF collected and sent for labs as per request.  No complication.  Post procedure instructions reviewed.

## 2012-03-31 NOTE — ED Provider Notes (Signed)
5:01 PM  Pt not in room, currently out for procedure.  6:37 PM CSF shows increased WBC, 95 lymphocytes.  Discussed results with Dr Bernette Mayers who has reviewed results and diagnosis viral meningitis.  States if patient is feeling better, may be d/c home.  If not, may be admitted.   7:16 PM Given patient's results appearing as if she has meningitis, I have discussed with Dr Oletta Lamas and Dr Bernette Mayers, and as I am currently pregnant I will not be directly involved in seeing or examining the patient.    7:18 PM Dr Bernette Mayers has seen patient.  States headache is completely resolved.  Pt was told to lie flat for several hours following LP.  Plan is for d/c at 9:00pm.  Per Dr Bernette Mayers, he has discussed with her return precautions for fever, headache, stiff neck, and plan continues to be for d/c at 9:00.    9:09 PM Per nurse, patient remains asymptomatic.  No complaints or needs at this time.  Per Dr Janese Banks instructions, patient to be discharged home with return precautions.  Also given resources for PCP follow up.  Prescription given for home Norvasc.    Results for orders placed during the hospital encounter of 03/31/12  URINALYSIS, ROUTINE W REFLEX MICROSCOPIC      Component Value Range   Color, Urine YELLOW  YELLOW   APPearance CLEAR  CLEAR   Specific Gravity, Urine 1.015  1.005 - 1.030   pH 6.5  5.0 - 8.0   Glucose, UA NEGATIVE  NEGATIVE mg/dL   Hgb urine dipstick NEGATIVE  NEGATIVE   Bilirubin Urine NEGATIVE  NEGATIVE   Ketones, ur NEGATIVE  NEGATIVE mg/dL   Protein, ur NEGATIVE  NEGATIVE mg/dL   Urobilinogen, UA 0.2  0.0 - 1.0 mg/dL   Nitrite NEGATIVE  NEGATIVE   Leukocytes, UA NEGATIVE  NEGATIVE  POCT PREGNANCY, URINE      Component Value Range   Preg Test, Ur NEGATIVE  NEGATIVE  CBC WITH DIFFERENTIAL      Component Value Range   WBC 4.8  4.0 - 10.5 K/uL   RBC 4.53  3.87 - 5.11 MIL/uL   Hemoglobin 11.2 (*) 12.0 - 15.0 g/dL   HCT 14.7 (*) 82.9 - 56.2 %   MCV 73.5 (*) 78.0 - 100.0 fL   MCH  24.7 (*) 26.0 - 34.0 pg   MCHC 33.6  30.0 - 36.0 g/dL   RDW 13.0  86.5 - 78.4 %   Platelets 289  150 - 400 K/uL   Neutrophils Relative 61  43 - 77 %   Lymphocytes Relative 27  12 - 46 %   Monocytes Relative 9  3 - 12 %   Eosinophils Relative 3  0 - 5 %   Basophils Relative 0  0 - 1 %   Neutro Abs 3.0  1.7 - 7.7 K/uL   Lymphs Abs 1.3  0.7 - 4.0 K/uL   Monocytes Absolute 0.4  0.1 - 1.0 K/uL   Eosinophils Absolute 0.1  0.0 - 0.7 K/uL   Basophils Absolute 0.0  0.0 - 0.1 K/uL   RBC Morphology POLYCHROMASIA PRESENT    BASIC METABOLIC PANEL      Component Value Range   Sodium 137  135 - 145 mEq/L   Potassium 3.9  3.5 - 5.1 mEq/L   Chloride 104  96 - 112 mEq/L   CO2 23  19 - 32 mEq/L   Glucose, Bld 136 (*) 70 - 99 mg/dL   BUN 13  6 - 23 mg/dL   Creatinine, Ser 1.61  0.50 - 1.10 mg/dL   Calcium 9.5  8.4 - 09.6 mg/dL   GFR calc non Af Amer >90  >90 mL/min   GFR calc Af Amer >90  >90 mL/min  TROPONIN I      Component Value Range   Troponin I <0.30  <0.30 ng/mL  CSF CELL COUNT WITH DIFFERENTIAL      Component Value Range   Tube # 3     Color, CSF COLORLESS  COLORLESS   Appearance, CSF CLEAR  CLEAR   Supernatant NOT INDICATED     RBC Count, CSF 0  0 /cu mm   WBC, CSF 21 (*) 0 - 5 /cu mm   Segmented Neutrophils-CSF 1  0 - 6 %   Lymphs, CSF 95 (*) 40 - 80 %   Monocyte-Macrophage-Spinal Fluid 4 (*) 15 - 45 %  PROTEIN AND GLUCOSE, CSF      Component Value Range   Glucose, CSF 65  43 - 76 mg/dL   Total  Protein, CSF 35  15 - 45 mg/dL  GRAM STAIN      Component Value Range   Specimen Description CSF     Special Requests TUBE 2@1 .8CC     Gram Stain       Value: CYTOSPIN PREP     WBC PRESENT, PREDOMINANTLY MONONUCLEAR     NO ORGANISMS SEEN   Report Status 03/31/2012 FINAL     Ct Head Wo Contrast  03/31/2012  *RADIOLOGY REPORT*  Clinical Data: Headache.  Hypertensive off medication.  CT HEAD WITHOUT CONTRAST  Technique:  Contiguous axial images were obtained from the base of the  skull through the vertex without contrast.  Comparison: 02/09/2008.  Findings: No intracranial hemorrhage.  No CT evidence of large acute infarct.  Partially empty sella.  This can be seen in patients with pseudotumor cerebri.  Prominent ossification of the anterior falx unchanged. No intracranial mass lesion detected on this unenhanced exam.  No hydrocephalus.  Mild exophthalmos.  Visualized paranasal sinuses, mastoid air cells and middle ear cavities are clear.  IMPRESSION: No intracranial hemorrhage.  No CT evidence of large acute infarct.  Partially empty sella.  This can be seen in patients with pseudotumor cerebri.  Prominent ossification of the anterior falx unchanged. No intracranial mass lesion detected on this unenhanced exam.  No hydrocephalus.  Mild exophthalmos.   Original Report Authenticated By: Fuller Canada, M.D.    Dg Lumbar Puncture Fluoro Guide  03/31/2012  *RADIOLOGY REPORT*  Clinical Data: Headaches.  LUMBAR PUNCTURE FLUORO GUIDE  Comparison: 03/31/2012 CT  Findings: The procedure and associated risks were reviewed with the patient.  Questions answered.  Written as well as oral witnessed consent obtained.  Time out performed.  Under fluoroscopic guidance and aseptic technique, an L3-4 lumbar puncture was performed with a single pass of a 20-gauge spinal needle.  Opening pressure 17 ml of water.  8 ml of clear cerebrospinal fluid collected and sent for labs as per request.  No immediate complications.  Postprocedure instructions reviewed with the patient in detail.  IMPRESSION: L3-4 lumbar puncture performed with collection of 8 ml of clear cerebrospinal fluid.  Opening pressure 17 ml of water.   Original Report Authenticated By: Fuller Canada, M.D.       [place], [place] 03/31/12 2112  [place], [place] 03/31/12 2137

## 2012-03-31 NOTE — ED Provider Notes (Signed)
Medical screening examination/treatment/procedure(s) were conducted as a shared visit with non-physician practitioner(s) and myself.  I personally evaluated the patient during the encounter  Pt with sudden onset headache sent for LP to rule out SAH has likely viral meningitis. She has no concerning findings for bacterial meningitis. Headache resolved. Advised to return for any further headache, fever, neck stiffness or for any other concerns.   Erek Kowal B. Bernette Mayers, MD 03/31/12 [phone]

## 2012-03-31 NOTE — ED Notes (Signed)
C/o urinary frequency x 2-3 days. Denies dysuria, hematuria.  Also reports ran out of BP med 1 week ago. Has no PCP, insurance or refills of med.  Denies any pain

## 2012-04-01 LAB — PATHOLOGIST SMEAR REVIEW

## 2012-04-01 NOTE — ED Provider Notes (Signed)
Medical screening examination/treatment/procedure(s) were conducted as a shared visit with non-physician practitioner(s) and myself.  I personally evaluated the patient during the encounter   Glynn Octave, MD 04/01/12 [phone]

## 2012-04-04 LAB — CSF CULTURE

## 2012-04-04 LAB — CSF CULTURE W GRAM STAIN: Culture: NO GROWTH

## 2012-06-20 ENCOUNTER — Emergency Department (HOSPITAL_COMMUNITY)
Admission: EM | Admit: 2012-06-20 | Discharge: 2012-06-20 | Disposition: A | Discharge status: Home / Self Care | Payer: Self-pay | Attending: Emergency Medicine | Admitting: Emergency Medicine

## 2012-06-20 ENCOUNTER — Emergency Department (HOSPITAL_COMMUNITY): Payer: Self-pay

## 2012-06-20 DIAGNOSIS — Z8739 Personal history of other diseases of the musculoskeletal system and connective tissue: Secondary | ICD-10-CM | POA: Insufficient documentation

## 2012-06-20 DIAGNOSIS — I10 Essential (primary) hypertension: Secondary | ICD-10-CM | POA: Insufficient documentation

## 2012-06-20 DIAGNOSIS — Z79899 Other long term (current) drug therapy: Secondary | ICD-10-CM | POA: Insufficient documentation

## 2012-06-20 DIAGNOSIS — I517 Cardiomegaly: Secondary | ICD-10-CM | POA: Insufficient documentation

## 2012-06-20 DIAGNOSIS — Z862 Personal history of diseases of the blood and blood-forming organs and certain disorders involving the immune mechanism: Secondary | ICD-10-CM | POA: Insufficient documentation

## 2012-06-20 DIAGNOSIS — J069 Acute upper respiratory infection, unspecified: Secondary | ICD-10-CM | POA: Insufficient documentation

## 2012-06-20 LAB — CBC WITH DIFFERENTIAL/PLATELET
Basophils Absolute: 0 10*3/uL (ref 0.0–0.1)
Basophils Relative: 0 % (ref 0–1)
Eosinophils Absolute: 0.1 10*3/uL (ref 0.0–0.7)
Eosinophils Relative: 3 % (ref 0–5)
HCT: 33.4 % — ABNORMAL LOW (ref 36.0–46.0)
Hemoglobin: 11.1 g/dL — ABNORMAL LOW (ref 12.0–15.0)
Lymphocytes Relative: 16 % (ref 12–46)
Lymphs Abs: 0.8 10*3/uL (ref 0.7–4.0)
MCH: 24.1 pg — ABNORMAL LOW (ref 26.0–34.0)
MCHC: 33.2 g/dL (ref 30.0–36.0)
MCV: 72.6 fL — ABNORMAL LOW (ref 78.0–100.0)
Monocytes Absolute: 0.6 10*3/uL (ref 0.1–1.0)
Monocytes Relative: 13 % — ABNORMAL HIGH (ref 3–12)
Neutro Abs: 3.4 10*3/uL (ref 1.7–7.7)
Neutrophils Relative %: 69 % (ref 43–77)
Platelets: 294 10*3/uL (ref 150–400)
RBC: 4.6 MIL/uL (ref 3.87–5.11)
RDW: 15.2 % (ref 11.5–15.5)
WBC: 5 10*3/uL (ref 4.0–10.5)

## 2012-06-20 LAB — D-DIMER, QUANTITATIVE: D-Dimer, Quant: <0.27 ug/mL-FEU (ref 0.00–0.48)

## 2012-06-20 LAB — POCT I-STAT, CHEM 8
BUN: 13 mg/dL (ref 6–23)
Calcium, Ion: 1.19 mmol/L (ref 1.12–1.23)
Chloride: 99 mEq/L (ref 96–112)
Creatinine, Ser: 0.7 mg/dL (ref 0.50–1.10)
Glucose, Bld: 142 mg/dL — ABNORMAL HIGH (ref 70–99)
HCT: 37 % (ref 36.0–46.0)
Hemoglobin: 12.6 g/dL (ref 12.0–15.0)
Potassium: 3.1 mEq/L — ABNORMAL LOW (ref 3.5–5.1)
Sodium: 138 mEq/L (ref 135–145)
TCO2: 27 mmol/L (ref 0–100)

## 2012-06-20 LAB — TROPONIN I: Troponin I: <0.3 ng/mL (ref <0.30)

## 2012-06-20 LAB — PREGNANCY, URINE: Preg Test, Ur: NEGATIVE

## 2012-06-20 MED ORDER — ONDANSETRON HCL 4 MG/2ML IJ SOLN
4.0000 mg | Freq: Once | INTRAMUSCULAR | Status: AC
  Administered 2012-06-20: 4 mg via INTRAVENOUS
  Filled 2012-06-20: qty 2

## 2012-06-20 MED ORDER — SODIUM CHLORIDE 0.9 % IV BOLUS (SEPSIS)
500.0000 mL | Freq: Once | INTRAVENOUS | Status: AC
  Administered 2012-06-20: 500 mL via INTRAVENOUS

## 2012-06-20 MED ORDER — MORPHINE SULFATE 4 MG/ML IJ SOLN
4.0000 mg | Freq: Once | INTRAMUSCULAR | Status: AC
  Administered 2012-06-20: 4 mg via INTRAVENOUS
  Filled 2012-06-20: qty 1

## 2012-06-20 MED ORDER — POTASSIUM CHLORIDE CRYS ER 20 MEQ PO TBCR
40.0000 meq | EXTENDED_RELEASE_TABLET | Freq: Once | ORAL | Status: AC
  Administered 2012-06-20: 40 meq via ORAL
  Filled 2012-06-20: qty 2

## 2012-06-20 NOTE — ED Notes (Signed)
Discharge instructions reviewed. Pt verbalized understanding.  

## 2012-06-20 NOTE — ED Notes (Signed)
Pt c/o left side chest pain since last night. Pt states she was awoken from sleep from the chest pain. Since last night the pain is intermittent, no radiation, sharp with nausea. Pt denies shortness of breath. Pt also presents with a cough for a week. Pt does not smoke and is not on birth control. Pt has hx of lupus. A&Ox4, skin warm and dry, respirations equal and unlabored.

## 2012-06-20 NOTE — ED Provider Notes (Signed)
History     CSN: [id number]  Arrival date & time 06/20/12  4098   First MD Initiated Contact with Patient 06/20/12 [phone]      No chief complaint on file.   (Consider location/radiation/quality/duration/timing/severity/associated sxs/prior treatment) HPI  36 year old [id number]  female with history of lupus, hypertension, and cardiomegaly presents complaining of chest pain and blurry vision.  Patient reports she has been having left-sided chest pain since yesterday morning.  Pain is described as sharp, intermittent, nonradiating, lasting for a minute and resolved without treatment.  Does report having occasional sneezing, ear pain, with associated nausea. The pain woke her up from sleep.She has been having a nonproductive cough all week. She denies any fever, chills, headache, shortness of breath, hemoptysis, doe, v/d, or urinary sxs.  No rash.  No prior hx of DVT or PE.  No significant cardiac history except cardiomegaly which has remain stable.  Pt is not a smoker, and not on birth control pill.  Pt currently rates her CP as a 2/10, and endorse nausea.  She also notice occasional floaters and blurry vision which is transients and has been ongoing several times yesterday.  No hx of diabetes.    Past Medical History  Diagnosis Date  . Anemia   . Enlarged heart     managed by cardiology  . Hypertension   . Lupus 1999  . Allergy     No past surgical history on file.  Family History  Problem Relation Age of Onset  . Diabetes Mother   . Diabetes Maternal Uncle   . Hypertension Maternal Grandmother     History  Substance Use Topics  . Smoking status: Never Smoker   . Smokeless tobacco: Never Used  . Alcohol Use: Yes     Comment: rarely    OB History    Grav Para Term Preterm Abortions TAB SAB Ect Mult Living   5 2 0 2 3 1 2 0 0 2       Review of Systems  All other systems reviewed and are negative.    Allergies  Septra  Home Medications   Current Outpatient Rx  Name   Route  Sig  Dispense  Refill  . AMLODIPINE BESYLATE 10 MG PO TABS   Oral   Take 1 tablet (10 mg total) by mouth daily.   30 tablet   11   . AMLODIPINE BESYLATE 10 MG PO TABS   Oral   Take 1 tablet (10 mg total) by mouth daily.   30 tablet   0     There were no vitals taken for this visit.  Physical Exam  Nursing note and vitals reviewed. Constitutional: She is oriented to person, place, and time. She appears well-developed and well-nourished. No distress.       Awake, alert, nontoxic appearance  HENT:  Head: Atraumatic.  Right Ear: External ear normal.  Nose: Nose normal.  Mouth/Throat: Oropharynx is clear and moist. No oropharyngeal exudate.       Left TM dull, non erythematous  R ear with post auricular lymphadenopathy  Eyes: Conjunctivae normal and EOM are normal. Pupils are equal, round, and reactive to light. Right eye exhibits no discharge. Left eye exhibits no discharge. No scleral icterus.  Neck: Normal range of motion. Neck supple.  Cardiovascular: Normal rate, regular rhythm and intact distal pulses.   Pulmonary/Chest: Effort normal. No respiratory distress. She has no wheezes. She has no rales. She exhibits no tenderness.  Abdominal: Soft. There is no tenderness. There  is no rebound.  Musculoskeletal: She exhibits no edema and no tenderness.       ROM appears intact, no obvious focal weakness  BLE: no palpable cords, erythema, no calves tenderness, edema, homans sign neg.    Lymphadenopathy:    She has no cervical adenopathy.  Neurological: She is alert and oriented to person, place, and time.       Mental status and motor strength appears intact  Skin: Skin is warm. No rash noted.  Psychiatric: She has a normal mood and affect.    ED Course  Procedures (including critical care time)  Labs Reviewed - No data to display No results found.   No diagnosis found.   Date: 06/20/2012  Rate: 90  Rhythm: normal sinus rhythm  QRS Axis: normal  Intervals: QT  prolonged  ST/T Wave abnormalities: nonspecific ST/T changes  Conduction Disutrbances:nonspecific intraventricular conduction delay  Narrative Interpretation: borderline repol abnormality, and borderline IVCD  Old EKG Reviewed: changes noted  Results for orders placed during the hospital encounter of 06/20/12  CBC WITH DIFFERENTIAL      Component Value Range   WBC 5.0  4.0 - 10.5 K/uL   RBC 4.60  3.87 - 5.11 MIL/uL   Hemoglobin 11.1 (*) 12.0 - 15.0 g/dL   HCT 56.2 (*) 13.0 - 86.5 %   MCV 72.6 (*) 78.0 - 100.0 fL   MCH 24.1 (*) 26.0 - 34.0 pg   MCHC 33.2  30.0 - 36.0 g/dL   RDW 78.4  69.6 - 29.5 %   Platelets 294  150 - 400 K/uL   Neutrophils Relative 69  43 - 77 %   Neutro Abs 3.4  1.7 - 7.7 K/uL   Lymphocytes Relative 16  12 - 46 %   Lymphs Abs 0.8  0.7 - 4.0 K/uL   Monocytes Relative 13 (*) 3 - 12 %   Monocytes Absolute 0.6  0.1 - 1.0 K/uL   Eosinophils Relative 3  0 - 5 %   Eosinophils Absolute 0.1  0.0 - 0.7 K/uL   Basophils Relative 0  0 - 1 %   Basophils Absolute 0.0  0.0 - 0.1 K/uL  PREGNANCY, URINE      Component Value Range   Preg Test, Ur NEGATIVE  NEGATIVE  D-DIMER, QUANTITATIVE      Component Value Range   D-Dimer, Quant <0.27  0.00 - 0.48 ug/mL-FEU  TROPONIN I      Component Value Range   Troponin I <0.30  <0.30 ng/mL  POCT I-STAT, CHEM 8      Component Value Range   Sodium 138  135 - 145 mEq/L   Potassium 3.1 (*) 3.5 - 5.1 mEq/L   Chloride 99  96 - 112 mEq/L   BUN 13  6 - 23 mg/dL   Creatinine, Ser 2.84  0.50 - 1.10 mg/dL   Glucose, Bld 132 (*) 70 - 99 mg/dL   Calcium, Ion 4.40  1.02 - 1.23 mmol/L   TCO2 27  0 - 100 mmol/L   Hemoglobin 12.6  12.0 - 15.0 g/dL   HCT 72.5  36.6 - 44.0 %   Dg Chest 2 View  06/20/2012  *RADIOLOGY REPORT*  Clinical Data: Chest pain.  CHEST - 2 VIEW  Comparison: June 29, 2010.  Findings: Mild cardiomegaly is noted. Left lung is clear. Mild central pulmonary vascular congestion is noted  which is not significantly changed  compared to prior exam.  There is interval development of faint densities in  the right upper lobe which may represent early pneumonitis or edema. Bony thorax is intact.  IMPRESSION: Mild right upper lobe opacity concerning for early pneumonitis or edema. Mild central pulmonary vascular congestion is noted which is unchanged compared to prior exam.   Original Report Authenticated By: Lupita Raider.,  M.D.     1. URI   MDM  Pt with URI sxs, however she has hx of Lupus which increases risk of PE.  Will d-dimer.  Story sounds non cardiac.  Does have hx of cardiomegaly, will check cxr, ekg and basic labs.  Care discussed with my attending.   9:45 AM Patient's lab is only remarkable for a potassium of 3.1. Supplementation given here in the ED. Patient recommend to eat banana as it is a good source of potassium.  Her hemoglobin is 11.1, at her baseline. She has a negative d-dimer, unremarkable troponin, negative pregnancy test, and the chest x-rays that show some mild finding concerning for pneumonitis. However no evidence of pneumonia.  Patient is currently chest pain free, no other acute symptoms. Care were discussed with my attending. Patient is stable to be discharged. Resource given for patient to followup for further management of her health condition. Patient is aware that her blood pressure has remained high. She will continue to take her hypertension medication, norvasc.  She will need to have it recheck.    BP 173/105  Pulse 98  Temp 98.6 F (37 C) (Oral)  Resp 96  SpO2 100%  LMP 06/09/2012  I have reviewed nursing notes and vital signs. I personally reviewed the imaging tests through PACS system  I reviewed available ER/hospitalization records thought the EMR     Fayrene Helper, [place] 06/20/12 1610

## 2012-06-20 NOTE — ED Notes (Signed)
Patient transported to X-ray 

## 2012-06-20 NOTE — ED Notes (Signed)
Pt transported to xray 

## 2012-06-21 NOTE — ED Provider Notes (Signed)
Pt taken care of by pa tran and dr pickering  Suzi Roots, MD 06/21/12 [phone]

## 2012-12-02 ENCOUNTER — Encounter (HOSPITAL_COMMUNITY): Payer: Self-pay | Admitting: *Deleted

## 2012-12-02 ENCOUNTER — Emergency Department (HOSPITAL_COMMUNITY)
Admission: EM | Admit: 2012-12-02 | Discharge: 2012-12-02 | Disposition: A | Discharge status: Home / Self Care | Payer: Self-pay | Attending: Emergency Medicine | Admitting: Emergency Medicine

## 2012-12-02 DIAGNOSIS — I10 Essential (primary) hypertension: Secondary | ICD-10-CM | POA: Insufficient documentation

## 2012-12-02 DIAGNOSIS — R300 Dysuria: Secondary | ICD-10-CM | POA: Insufficient documentation

## 2012-12-02 DIAGNOSIS — N760 Acute vaginitis: Secondary | ICD-10-CM | POA: Insufficient documentation

## 2012-12-02 DIAGNOSIS — Z8739 Personal history of other diseases of the musculoskeletal system and connective tissue: Secondary | ICD-10-CM | POA: Insufficient documentation

## 2012-12-02 DIAGNOSIS — Z79899 Other long term (current) drug therapy: Secondary | ICD-10-CM | POA: Insufficient documentation

## 2012-12-02 DIAGNOSIS — Z8679 Personal history of other diseases of the circulatory system: Secondary | ICD-10-CM | POA: Insufficient documentation

## 2012-12-02 DIAGNOSIS — B9689 Other specified bacterial agents as the cause of diseases classified elsewhere: Secondary | ICD-10-CM

## 2012-12-02 DIAGNOSIS — M549 Dorsalgia, unspecified: Secondary | ICD-10-CM | POA: Insufficient documentation

## 2012-12-02 DIAGNOSIS — Z3202 Encounter for pregnancy test, result negative: Secondary | ICD-10-CM | POA: Insufficient documentation

## 2012-12-02 DIAGNOSIS — R109 Unspecified abdominal pain: Secondary | ICD-10-CM | POA: Insufficient documentation

## 2012-12-02 LAB — POCT PREGNANCY, URINE: Preg Test, Ur: NEGATIVE

## 2012-12-02 LAB — WET PREP, GENITAL
Trich, Wet Prep: NONE SEEN
Yeast Wet Prep HPF POC: NONE SEEN

## 2012-12-02 MED ORDER — METRONIDAZOLE 500 MG PO TABS: 500.0000 mg | ORAL_TABLET | Freq: Two times a day (BID) | ORAL | Status: DC

## 2012-12-02 NOTE — ED Provider Notes (Signed)
History     CSN: [id number]  Arrival date & time 12/02/12  1113   First MD Initiated Contact with Patient 12/02/12 1125      Chief Complaint  Patient presents with  . Back Pain  . Urinary Frequency    Patient is a 37 y.o. female presenting with frequency and abdominal pain. The history is provided by the patient.  Urinary Frequency Associated symptoms include abdominal pain. Pertinent negatives include no chest pain, chills, coughing, fever, neck pain, rash or vomiting.  Abdominal Pain Pain location:  Suprapubic Pain quality: aching   Pain radiates to:  Does not radiate Pain severity:  Mild Onset quality:  Gradual Duration:  2 days Timing:  Constant Progression:  Waxing and waning Chronicity:  New Context comment:  Dysuria; increased urinary frequency and hesitancy Associated symptoms: no chest pain, no chills, no constipation, no cough, no diarrhea, no dysuria, no fever, no shortness of breath and no vomiting     Past Medical History  Diagnosis Date  . Anemia   . Enlarged heart     managed by cardiology  . Hypertension   . Lupus 1999  . Allergy     History reviewed. No pertinent past surgical history.  Family History  Problem Relation Age of Onset  . Diabetes Mother   . Diabetes Maternal Uncle   . Hypertension Maternal Grandmother     History  Substance Use Topics  . Smoking status: Never Smoker   . Smokeless tobacco: Never Used  . Alcohol Use: Yes     Comment: rarely    OB History   Grav Para Term Preterm Abortions TAB SAB Ect Mult Living   5 2 0 2 3 1 2 0 0 2       Review of Systems  Constitutional: Negative for fever, chills, activity change and appetite change.  HENT: Negative for neck pain and neck stiffness.   Respiratory: Negative for cough, chest tightness, shortness of breath and wheezing.   Cardiovascular: Negative for chest pain and palpitations.  Gastrointestinal: Positive for abdominal pain. Negative for vomiting, diarrhea and  constipation.  Genitourinary: Positive for frequency. Negative for dysuria, decreased urine volume, difficulty urinating and menstrual problem.  Skin: Negative for rash and wound.  Neurological: Negative for seizures, syncope, facial asymmetry and light-headedness.  Psychiatric/Behavioral: Negative for confusion and agitation.  All other systems reviewed and are negative.    Allergies  Septra  Home Medications   Current Outpatient Rx  Name  Route  Sig  Dispense  Refill  . amLODipine (NORVASC) 10 MG tablet   Oral   Take 1 tablet (10 mg total) by mouth daily.   30 tablet   0   . diphenhydrAMINE (BENADRYL) 25 mg capsule   Oral   Take 25 mg by mouth every 6 (six) hours as needed for allergies.         . fexofenadine (ALLEGRA) 180 MG tablet   Oral   Take 180 mg by mouth daily.         . hydroxychloroquine (PLAQUENIL) 200 MG tablet   Oral   Take 200 mg by mouth 2 (two) times daily.           BP 143/95  Pulse 101  Temp(Src) 97.8 F (36.6 C) (Oral)  Resp 18  SpO2 100%  LMP 11/20/2012  Physical Exam  Nursing note and vitals reviewed. Constitutional: She is oriented to person, place, and time. She appears well-developed and well-nourished.  HENT:  Head: Normocephalic and atraumatic.  Right Ear: External ear normal.  Left Ear: External ear normal.  Nose: Nose normal.  Mouth/Throat: Oropharynx is clear and moist. No oropharyngeal exudate.  Eyes: Conjunctivae are normal. Pupils are equal, round, and reactive to light.  Neck: Normal range of motion. Neck supple.  Cardiovascular: Normal rate, regular rhythm, normal heart sounds and intact distal pulses.  Exam reveals no gallop and no friction rub.   No murmur heard. Pulmonary/Chest: Effort normal and breath sounds normal. No respiratory distress. She has no wheezes. She has no rales. She exhibits no tenderness.  Abdominal: Soft. Bowel sounds are normal. She exhibits no distension and no mass. There is tenderness (mild  suprapubic tenderness). There is no rebound and no guarding.  Genitourinary: Vagina normal. No vaginal discharge found.  Musculoskeletal: Normal range of motion. She exhibits no edema and no tenderness.  Neurological: She is alert and oriented to person, place, and time.  Skin: Skin is warm and dry.  Psychiatric: She has a normal mood and affect. Her behavior is normal. Judgment and thought content normal.    ED Course  Procedures (including critical care time)  Labs Reviewed  WET PREP, GENITAL - Abnormal; Notable for the following:    Clue Cells Wet Prep HPF POC FEW (*)    WBC, Wet Prep HPF POC TOO NUMEROUS TO COUNT (*)    All other components within normal limits  GC/CHLAMYDIA PROBE AMP  URINALYSIS, ROUTINE W REFLEX MICROSCOPIC  POCT PREGNANCY, URINE   No results found.   1. Bacterial vaginosis       MDM  37 yo F presents for right flank pain, increased urinary hesitancy/frequency, suprapubic abdominal pain, and vaginal discharge for 5 days. Denies fever/chills, vomiting, or abdominal pain besides suprapubic pain. Clinical picture not concerning for acute appendicitis, ovarian torsion, ectopic pregnancy, diverticulitis, nephrolithiasis, PID/TOA, or AAA. Labs not c/w UTI; however, labs are consistent with bacterial vaginosis; will treat with Flagyl and instruct pt to not use with EtOH. Patient given return precautions, including worsening of signs or symptoms. Patient instructed to follow-up with primary care physician.          Clemetine Marker, MD 12/02/12 1535

## 2012-12-02 NOTE — ED Notes (Signed)
NAD noted at time of d/c home 

## 2012-12-02 NOTE — ED Notes (Signed)
Pt is here with right lower back pain and frequent urination for couple days.

## 2012-12-03 LAB — GC/CHLAMYDIA PROBE AMP
CT Probe RNA: NEGATIVE
GC Probe RNA: NEGATIVE

## 2012-12-04 NOTE — ED Provider Notes (Signed)
I saw and evaluated the patient, reviewed the resident's note and I agree with the findings and plan.  Esperanza Madrazo T Crisol Muecke, MD 12/04/12 1612 

## 2013-02-26 ENCOUNTER — Encounter (HOSPITAL_COMMUNITY): Payer: Self-pay | Admitting: *Deleted

## 2013-02-26 ENCOUNTER — Emergency Department (INDEPENDENT_AMBULATORY_CARE_PROVIDER_SITE_OTHER)
Admission: EM | Admit: 2013-02-26 | Discharge: 2013-02-26 | Disposition: A | Discharge status: Home / Self Care | Payer: No Typology Code available for payment source | Source: Home / Self Care

## 2013-02-26 DIAGNOSIS — J302 Other seasonal allergic rhinitis: Secondary | ICD-10-CM

## 2013-02-26 DIAGNOSIS — I10 Essential (primary) hypertension: Secondary | ICD-10-CM

## 2013-02-26 DIAGNOSIS — M329 Systemic lupus erythematosus, unspecified: Secondary | ICD-10-CM

## 2013-02-26 DIAGNOSIS — J309 Allergic rhinitis, unspecified: Secondary | ICD-10-CM

## 2013-02-26 MED ORDER — FLUTICASONE PROPIONATE 50 MCG/ACT NA SUSP: 2.0000 | Freq: Every day | NASAL | Status: DC

## 2013-02-26 MED ORDER — HYDROCHLOROTHIAZIDE 25 MG PO TABS: 25.0000 mg | ORAL_TABLET | Freq: Every day | ORAL | Status: DC

## 2013-02-26 MED ORDER — CETIRIZINE HCL 10 MG PO TABS: 10.0000 mg | ORAL_TABLET | Freq: Every day | ORAL | Status: DC

## 2013-02-26 NOTE — ED Provider Notes (Signed)
[name] is a 37 y.o. female who presents to Urgent Care today for cough congestion and sneezing present for one or 2 weeks. She has tried Benadryl and NyQuil which has helped a bit. Additionally she notes a itchy rash across her body. She denies any new medications. Her past history significant for hypertension managed with Norvasc. Additionally her past medical history significant for lupus diagnosed in 1999 and is currently taking Plaquenil. In 2010 she had a chest pain episode and had a normal echo and stress test.   PMH reviewed. As above History  Substance Use Topics  . Smoking status: Never Smoker   . Smokeless tobacco: Never Used  . Alcohol Use: Yes     Comment: rarely   ROS as above Medications reviewed. No current facility-administered medications for this encounter.   Current Outpatient Prescriptions  Medication Sig Dispense Refill  . amLODipine (NORVASC) 10 MG tablet Take 1 tablet (10 mg total) by mouth daily.  30 tablet  0  . diphenhydrAMINE (BENADRYL) 25 mg capsule Take 25 mg by mouth every 6 (six) hours as needed for allergies.      . fexofenadine (ALLEGRA) 180 MG tablet Take 180 mg by mouth daily.      . hydroxychloroquine (PLAQUENIL) 200 MG tablet Take 200 mg by mouth 2 (two) times daily.      . metroNIDAZOLE (FLAGYL) 500 MG tablet Take 1 tablet (500 mg total) by mouth 2 (two) times daily.  14 tablet  0    Exam:  BP 189/110  Pulse 82  Temp(Src) 98.4 F (36.9 C) (Oral)  Resp 20  SpO2 98% Gen: Well NAD HEENT: EOMI,  MMM Lungs: CTABL Nl WOB Heart: RRR no soft systolic ejection murmur at the upper left and right sternal borders. Abd: NABS, NT, ND Exts: Non edematous BL  LE, warm and well perfused.   ECHO 2010:  SUMMARY - Overall left ventricular systolic function was normal. Left ventricular ejection fraction was estimated , range being 60 % to 65 %. There was no diagnostic evidence of left ventricular regional wall motion abnormalities.  Left ventricular wall thickness was mildly to moderately increased. - There was mild aortic valvular regurgitation. - There was mild mitral valvular regurgitation. - The left atrium was mildly dilated.  Nuclear Stress test 2010: IMPRESSION:  Normal examination without evidence of exercise induced myocardial  ischemia. Normal ejection fraction of 55%.    No results found for this or any previous visit (from the past 24 hour(s)). No results found.  Assessment and Plan: 37 y.o. female with  1) likely seasonal allergies.  Plan to treat with cetirizine and Flonase nasal spray.  Followup with primary care provider.  2) uncontrolled hypertension with murmur.  Plan to start hydrochlorothiazide as well. Followup with primary care provider.  May consider repeating echocardiogram at some point in the next few weeks.  Discussed warning signs or symptoms. Please see discharge instructions. Patient expresses understanding.      Rodolph Bong, MD 02/26/13 1046

## 2013-02-26 NOTE — ED Notes (Signed)
1-2 weeks of cough and congestion without fever/chills  She has tried some OTC meds with partial relief

## 2013-04-09 ENCOUNTER — Emergency Department (HOSPITAL_COMMUNITY): Payer: No Typology Code available for payment source

## 2013-04-09 ENCOUNTER — Emergency Department (HOSPITAL_COMMUNITY)
Admission: EM | Admit: 2013-04-09 | Discharge: 2013-04-09 | Disposition: A | Discharge status: Home / Self Care | Payer: No Typology Code available for payment source | Attending: Emergency Medicine | Admitting: Emergency Medicine

## 2013-04-09 ENCOUNTER — Encounter (HOSPITAL_COMMUNITY): Payer: Self-pay | Admitting: *Deleted

## 2013-04-09 DIAGNOSIS — Z79899 Other long term (current) drug therapy: Secondary | ICD-10-CM | POA: Insufficient documentation

## 2013-04-09 DIAGNOSIS — R52 Pain, unspecified: Secondary | ICD-10-CM | POA: Insufficient documentation

## 2013-04-09 DIAGNOSIS — R42 Dizziness and giddiness: Secondary | ICD-10-CM | POA: Insufficient documentation

## 2013-04-09 DIAGNOSIS — R350 Frequency of micturition: Secondary | ICD-10-CM | POA: Insufficient documentation

## 2013-04-09 DIAGNOSIS — I10 Essential (primary) hypertension: Secondary | ICD-10-CM | POA: Insufficient documentation

## 2013-04-09 DIAGNOSIS — R51 Headache: Secondary | ICD-10-CM | POA: Insufficient documentation

## 2013-04-09 DIAGNOSIS — Z8679 Personal history of other diseases of the circulatory system: Secondary | ICD-10-CM | POA: Insufficient documentation

## 2013-04-09 DIAGNOSIS — Z862 Personal history of diseases of the blood and blood-forming organs and certain disorders involving the immune mechanism: Secondary | ICD-10-CM | POA: Insufficient documentation

## 2013-04-09 DIAGNOSIS — H538 Other visual disturbances: Secondary | ICD-10-CM | POA: Insufficient documentation

## 2013-04-09 DIAGNOSIS — R079 Chest pain, unspecified: Secondary | ICD-10-CM

## 2013-04-09 DIAGNOSIS — Z3202 Encounter for pregnancy test, result negative: Secondary | ICD-10-CM | POA: Insufficient documentation

## 2013-04-09 DIAGNOSIS — R0789 Other chest pain: Secondary | ICD-10-CM | POA: Insufficient documentation

## 2013-04-09 DIAGNOSIS — Z8739 Personal history of other diseases of the musculoskeletal system and connective tissue: Secondary | ICD-10-CM | POA: Insufficient documentation

## 2013-04-09 LAB — COMPREHENSIVE METABOLIC PANEL
ALT: 30 U/L (ref 0–35)
AST: 24 U/L (ref 0–37)
Albumin: 3.7 g/dL (ref 3.5–5.2)
Alkaline Phosphatase: 61 U/L (ref 39–117)
BUN: 7 mg/dL (ref 6–23)
CO2: 24 mEq/L (ref 19–32)
Calcium: 9.3 mg/dL (ref 8.4–10.5)
Chloride: 102 mEq/L (ref 96–112)
Creatinine, Ser: 0.51 mg/dL (ref 0.50–1.10)
GFR calc Af Amer: >90 mL/min (ref >90)
GFR calc non Af Amer: >90 mL/min (ref >90)
Glucose, Bld: 100 mg/dL — ABNORMAL HIGH (ref 70–99)
Potassium: 3.5 mEq/L (ref 3.5–5.1)
Sodium: 137 mEq/L (ref 135–145)
Total Bilirubin: 0.3 mg/dL (ref 0.3–1.2)
Total Protein: 8.4 g/dL — ABNORMAL HIGH (ref 6.0–8.3)

## 2013-04-09 LAB — URINE MICROSCOPIC-ADD ON

## 2013-04-09 LAB — URINALYSIS, ROUTINE W REFLEX MICROSCOPIC
Bilirubin Urine: NEGATIVE
Glucose, UA: NEGATIVE mg/dL
Hgb urine dipstick: NEGATIVE
Ketones, ur: NEGATIVE mg/dL
Nitrite: NEGATIVE
Protein, ur: NEGATIVE mg/dL
Specific Gravity, Urine: 1.02 (ref 1.005–1.030)
Urobilinogen, UA: 0.2 mg/dL (ref 0.0–1.0)
pH: 6.5 (ref 5.0–8.0)

## 2013-04-09 LAB — GLUCOSE, CAPILLARY: Glucose-Capillary: 99 mg/dL (ref 70–99)

## 2013-04-09 LAB — CBC WITH DIFFERENTIAL/PLATELET
Basophils Absolute: 0 10*3/uL (ref 0.0–0.1)
Basophils Relative: 0 % (ref 0–1)
Eosinophils Absolute: 0.1 10*3/uL (ref 0.0–0.7)
Eosinophils Relative: 2 % (ref 0–5)
HCT: 33.8 % — ABNORMAL LOW (ref 36.0–46.0)
Hemoglobin: 11.5 g/dL — ABNORMAL LOW (ref 12.0–15.0)
Lymphocytes Relative: 30 % (ref 12–46)
Lymphs Abs: 1.1 10*3/uL (ref 0.7–4.0)
MCH: 24.4 pg — ABNORMAL LOW (ref 26.0–34.0)
MCHC: 34 g/dL (ref 30.0–36.0)
MCV: 71.6 fL — ABNORMAL LOW (ref 78.0–100.0)
Monocytes Absolute: 0.4 10*3/uL (ref 0.1–1.0)
Monocytes Relative: 12 % (ref 3–12)
Neutro Abs: 2.1 10*3/uL (ref 1.7–7.7)
Neutrophils Relative %: 56 % (ref 43–77)
Platelets: 246 10*3/uL (ref 150–400)
RBC: 4.72 MIL/uL (ref 3.87–5.11)
RDW: 15.8 % — ABNORMAL HIGH (ref 11.5–15.5)
WBC: 3.7 10*3/uL — ABNORMAL LOW (ref 4.0–10.5)

## 2013-04-09 LAB — PREGNANCY, URINE: Preg Test, Ur: NEGATIVE

## 2013-04-09 LAB — TROPONIN I
Troponin I: <0.3 ng/mL (ref <0.30)
Troponin I: <0.3 ng/mL (ref <0.30)

## 2013-04-09 LAB — D-DIMER, QUANTITATIVE (NOT AT ARMC): D-Dimer, Quant: <0.27 ug/mL-FEU (ref 0.00–0.48)

## 2013-04-09 MED ORDER — LABETALOL HCL 5 MG/ML IV SOLN
INTRAVENOUS | Status: AC
  Filled 2013-04-09: qty 4

## 2013-04-09 MED ORDER — ASPIRIN 81 MG PO CHEW: 81.0000 mg | CHEWABLE_TABLET | Freq: Every day | ORAL | Status: DC

## 2013-04-09 MED ORDER — METOCLOPRAMIDE HCL 10 MG PO TABS
10.0000 mg | ORAL_TABLET | Freq: Once | ORAL | Status: AC
  Administered 2013-04-09: 10 mg via ORAL
  Filled 2013-04-09: qty 1

## 2013-04-09 MED ORDER — IBUPROFEN 400 MG PO TABS
600.0000 mg | ORAL_TABLET | Freq: Once | ORAL | Status: AC
  Administered 2013-04-09: 600 mg via ORAL
  Filled 2013-04-09: qty 2

## 2013-04-09 NOTE — ED Notes (Signed)
Pt with hx of htn and Lupus to ED c/o blurred vision and headaches for several days.  C/o tingling to tip of R fingers last night.  Today she was sitting at desk and began to experience L sided chest pain. Denies sob or nausea.  BP of 247/123.

## 2013-04-09 NOTE — ED Notes (Signed)
Patient ambulatory to bathroom without issues. Obtained urine sample and sent to lab.

## 2013-04-09 NOTE — ED Provider Notes (Signed)
CSN: [id number]     Arrival date & time 04/09/13  1302 History   First MD Initiated Contact with Patient 04/09/13 1326     Chief Complaint  Patient presents with  . Blurred Vision  . Chest Pain   (Consider location/radiation/quality/duration/timing/severity/associated sxs/prior Treatment) HPI Patient is a 37 year old female with a history of hypertension and lupus presents the emergency department today for acute onset left-sided sharp chest pain starting around noon today while she was sitting in her car break is associated with blurred vision. States the chest pain was nonradiating. Since that time the chest pain has eased but is now more of a dull ache. She also complains of frontal headache since onset of the chest pain. She took her blood pressure medications this morning. Denies nausea, vomiting, abdominal pain or lower extremity edema or pain. She has no family history of coronary artery disease but does have a family history of thromboembolic disease. States her grandmother had blood clots. No recent travel or surgeries. The patient is not on birth control. She has no focal weakness or numbness. She did have tingling to the first 3 fingers of her right hand yesterday. It has since resolved. Initial blood pressure in triage was 247/123. Repeat blood pressure pressure in the emergency department is 160s Past Medical History  Diagnosis Date  . Anemia   . Enlarged heart     managed by cardiology  . Hypertension   . Lupus 1999  . Allergy    History reviewed. No pertinent past surgical history. Family History  Problem Relation Age of Onset  . Diabetes Mother   . Diabetes Maternal Uncle   . Hypertension Maternal Grandmother    History  Substance Use Topics  . Smoking status: Never Smoker   . Smokeless tobacco: Never Used  . Alcohol Use: Yes     Comment: rarely   OB History   Grav Para Term Preterm Abortions TAB SAB Ect Mult Living   5 2 0 2 3 1 2 0 0 2      Review of Systems   Constitutional: Negative for fever and chills.  Eyes: Positive for visual disturbance.  Respiratory: Negative for cough and shortness of breath.   Cardiovascular: Positive for chest pain.  Gastrointestinal: Negative for nausea, vomiting, abdominal pain and diarrhea.  Genitourinary: Positive for frequency. Negative for dysuria.  Musculoskeletal: Negative for myalgias and back pain.  Skin: Negative for rash and wound.  Neurological: Positive for dizziness, light-headedness and headaches. Negative for syncope, speech difficulty, weakness and numbness.  All other systems reviewed and are negative.    Allergies  Septra  Home Medications   Current Outpatient Rx  Name  Route  Sig  Dispense  Refill  . amLODipine (NORVASC) 10 MG tablet   Oral   Take 1 tablet (10 mg total) by mouth daily.   30 tablet   0   . cetirizine (ZYRTEC) 10 MG tablet   Oral   Take 1 tablet (10 mg total) by mouth daily.   30 tablet   2   . fluticasone (FLONASE) 50 MCG/ACT nasal spray   Nasal   Place 2 sprays into the nose daily.   16 g   2   . hydrochlorothiazide (HYDRODIURIL) 25 MG tablet   Oral   Take 1 tablet (25 mg total) by mouth daily.   30 tablet   1   . hydroxychloroquine (PLAQUENIL) 200 MG tablet   Oral   Take 200 mg by mouth 2 (two) times  daily.          BP 247/123  Pulse 105  Temp(Src) 98.5 F (36.9 C) (Oral)  Resp 18  Ht 5' (1.524 m)  Wt 230 lb (104.327 kg)  BMI 44.92 kg/m2  SpO2 99%  LMP 03/27/2013 Physical Exam  Nursing note and vitals reviewed. Constitutional: She is oriented to person, place, and time. She appears well-developed and well-nourished. No distress.  HENT:  Head: Normocephalic and atraumatic.  Mouth/Throat: Oropharynx is clear and moist.  No sinus tenderness to percussion.  Eyes: EOM are normal. Pupils are equal, round, and reactive to light.  Neck: Normal range of motion. Neck supple.  Cardiovascular: Normal rate and regular rhythm.   Pulmonary/Chest:  Effort normal and breath sounds normal. No respiratory distress. She has no wheezes. She has no rales. She exhibits no tenderness.  Abdominal: Soft. Bowel sounds are normal. She exhibits no distension and no mass. There is no tenderness. There is no rebound and no guarding.  Musculoskeletal: Normal range of motion. She exhibits no edema and no tenderness.  No calf tenderness or swelling. No palpable cords.  Neurological: She is alert and oriented to person, place, and time.  Patient is alert and oriented x3 with clear, goal oriented speech. Patient has 5/5 motor in all extremities. Sensation is intact to light touch. Bilateral finger-to-nose is normal with no signs of dysmetria.   Skin: Skin is warm and dry. No rash noted. No erythema.  Psychiatric: She has a normal mood and affect. Her behavior is normal.    ED Course  Procedures (including critical care time) Labs Review Labs Reviewed  GLUCOSE, CAPILLARY  CBC WITH DIFFERENTIAL  COMPREHENSIVE METABOLIC PANEL  TROPONIN I  URINALYSIS, ROUTINE W REFLEX MICROSCOPIC  PREGNANCY, URINE  D-DIMER, QUANTITATIVE   Imaging Review No results found.  Date: 04/09/2013  Rate: 109  Rhythm: sinus tachycardia  QRS Axis: right  Intervals: normal  ST/T Wave abnormalities: nonspecific T wave changes  Conduction Disutrbances:none  Narrative Interpretation:   Old EKG Reviewed: When compared with prior EKGs no significant changes are seen   MDM   Patient's chest pain is resolved. her in blood pressures normalized in the emergency department. Negative initial troponin and d-dimer. Patient so complains of mild frontal headache. We'll treat headache. A second troponin will be drawn 3 hours after initial. If negative patient is safe to be discharged home. Will sign out to on coming emergency physician.  Loren Racer, MD 04/09/13 1550

## 2013-04-09 NOTE — ED Notes (Signed)
Patient states she feels improved. Denies cp. Denies sob, denies visual changes. Medicated with Reglan per MAR. Tolerated well.

## 2013-04-09 NOTE — ED Notes (Signed)
Lab at bedside

## 2013-05-02 ENCOUNTER — Encounter (HOSPITAL_COMMUNITY): Payer: Self-pay | Admitting: *Deleted

## 2013-05-02 ENCOUNTER — Emergency Department (HOSPITAL_COMMUNITY)
Admission: EM | Admit: 2013-05-02 | Discharge: 2013-05-02 | Disposition: A | Discharge status: Home / Self Care | Payer: No Typology Code available for payment source | Attending: Emergency Medicine | Admitting: Emergency Medicine

## 2013-05-02 ENCOUNTER — Emergency Department (HOSPITAL_COMMUNITY): Payer: No Typology Code available for payment source

## 2013-05-02 DIAGNOSIS — R0789 Other chest pain: Secondary | ICD-10-CM

## 2013-05-02 DIAGNOSIS — Z79899 Other long term (current) drug therapy: Secondary | ICD-10-CM | POA: Insufficient documentation

## 2013-05-02 DIAGNOSIS — Z8739 Personal history of other diseases of the musculoskeletal system and connective tissue: Secondary | ICD-10-CM | POA: Insufficient documentation

## 2013-05-02 DIAGNOSIS — R059 Cough, unspecified: Secondary | ICD-10-CM | POA: Insufficient documentation

## 2013-05-02 DIAGNOSIS — R071 Chest pain on breathing: Secondary | ICD-10-CM | POA: Insufficient documentation

## 2013-05-02 DIAGNOSIS — R05 Cough: Secondary | ICD-10-CM | POA: Insufficient documentation

## 2013-05-02 DIAGNOSIS — I10 Essential (primary) hypertension: Secondary | ICD-10-CM | POA: Insufficient documentation

## 2013-05-02 DIAGNOSIS — R000 Tachycardia, unspecified: Secondary | ICD-10-CM | POA: Insufficient documentation

## 2013-05-02 DIAGNOSIS — Z7982 Long term (current) use of aspirin: Secondary | ICD-10-CM | POA: Insufficient documentation

## 2013-05-02 DIAGNOSIS — Z862 Personal history of diseases of the blood and blood-forming organs and certain disorders involving the immune mechanism: Secondary | ICD-10-CM | POA: Insufficient documentation

## 2013-05-02 LAB — BASIC METABOLIC PANEL
BUN: 9 mg/dL (ref 6–23)
CO2: 25 mEq/L (ref 19–32)
Calcium: 9.4 mg/dL (ref 8.4–10.5)
Chloride: 101 mEq/L (ref 96–112)
Creatinine, Ser: 0.58 mg/dL (ref 0.50–1.10)
GFR calc Af Amer: >90 mL/min (ref >90)
GFR calc non Af Amer: >90 mL/min (ref >90)
Glucose, Bld: 111 mg/dL — ABNORMAL HIGH (ref 70–99)
Potassium: 3.3 mEq/L — ABNORMAL LOW (ref 3.5–5.1)
Sodium: 137 mEq/L (ref 135–145)

## 2013-05-02 LAB — CBC
HCT: 32.6 % — ABNORMAL LOW (ref 36.0–46.0)
Hemoglobin: 11 g/dL — ABNORMAL LOW (ref 12.0–15.0)
MCH: 24.7 pg — ABNORMAL LOW (ref 26.0–34.0)
MCHC: 33.7 g/dL (ref 30.0–36.0)
MCV: 73.3 fL — ABNORMAL LOW (ref 78.0–100.0)
Platelets: 282 10*3/uL (ref 150–400)
RBC: 4.45 MIL/uL (ref 3.87–5.11)
RDW: 16.5 % — ABNORMAL HIGH (ref 11.5–15.5)
WBC: 4.5 10*3/uL (ref 4.0–10.5)

## 2013-05-02 LAB — POCT I-STAT TROPONIN I: Troponin i, poc: 0 ng/mL (ref 0.00–0.08)

## 2013-05-02 LAB — D-DIMER, QUANTITATIVE: D-Dimer, Quant: <0.27 ug/mL-FEU (ref 0.00–0.48)

## 2013-05-02 MED ORDER — POTASSIUM CHLORIDE CRYS ER 20 MEQ PO TBCR
40.0000 meq | EXTENDED_RELEASE_TABLET | Freq: Once | ORAL | Status: AC
  Administered 2013-05-02: 40 meq via ORAL
  Filled 2013-05-02: qty 2

## 2013-05-02 MED ORDER — HYDROCODONE-ACETAMINOPHEN 5-325 MG PO TABS
2.0000 | ORAL_TABLET | Freq: Once | ORAL | Status: AC
  Administered 2013-05-02: 2 via ORAL
  Filled 2013-05-02: qty 2

## 2013-05-02 MED ORDER — HYDROCODONE-ACETAMINOPHEN 5-325 MG PO TABS: 2.0000 | ORAL_TABLET | Freq: Four times a day (QID) | ORAL | Status: DC | PRN

## 2013-05-02 NOTE — ED Provider Notes (Signed)
CSN: [id number]     Arrival date & time 05/02/13  1149 History   First MD Initiated Contact with Patient 05/02/13 1226     Chief Complaint  Patient presents with  . Chest Pain   (Consider location/radiation/quality/duration/timing/severity/associated sxs/prior Treatment) HPI Complains of anterior chest pain nonradiating pleuritic in nature onset last night, gradual pain worse with deep inspiration or with cough. Improved with remaining still. she reports she coughed up thick clear sputum last night. She denies fever. Denies other associated symptoms. Treated herself with Tylenol PM last night with relief. Presently pain is mild to moderate. No shortness of breath. No other associated symptoms. Past Medical History  Diagnosis Date  . Anemia   . Enlarged heart     managed by cardiology  . Hypertension   . Lupus 1999  . Allergy    History reviewed. No pertinent past surgical history. Family History  Problem Relation Age of Onset  . Diabetes Mother   . Diabetes Maternal Uncle   . Hypertension Maternal Grandmother    History  Substance Use Topics  . Smoking status: Never Smoker   . Smokeless tobacco: Never Used  . Alcohol Use: Yes     Comment: rarely   OB History   Grav Para Term Preterm Abortions TAB SAB Ect Mult Living   5 2 0 2 3 1 2 0 0 2      Review of Systems  Constitutional: Negative.   HENT: Negative.   Respiratory: Positive for cough.        Occasional cough  Cardiovascular: Positive for chest pain.  Gastrointestinal: Negative.   Musculoskeletal: Negative.   Skin: Negative.   Neurological: Negative.   Psychiatric/Behavioral: Negative.   All other systems reviewed and are negative.    Allergies  Septra  Home Medications   Current Outpatient Rx  Name  Route  Sig  Dispense  Refill  . amLODipine (NORVASC) 10 MG tablet   Oral   Take 1 tablet (10 mg total) by mouth daily.   30 tablet   0   . aspirin 81 MG chewable tablet   Oral   Chew 1 tablet (81 mg  total) by mouth daily.   30 tablet   0   . cetirizine (ZYRTEC) 10 MG tablet   Oral   Take 10 mg by mouth daily as needed for allergies.         . hydroxychloroquine (PLAQUENIL) 200 MG tablet   Oral   Take 200 mg by mouth 2 (two) times daily.         Marland Kitchen ibuprofen (ADVIL,MOTRIN) 200 MG tablet   Oral   Take 600 mg by mouth every 6 (six) hours as needed for pain.          BP 161/107  Pulse 111  Temp(Src) 98.6 F (37 C) (Oral)  Resp 20  SpO2 98%  LMP 04/24/2013 Physical Exam  Nursing note and vitals reviewed. Constitutional: She appears well-developed and well-nourished.  HENT:  Head: Normocephalic and atraumatic.  Eyes: Conjunctivae are normal. Pupils are equal, round, and reactive to light.  Neck: Neck supple. No tracheal deviation present. No thyromegaly present.  Cardiovascular: Regular rhythm.   No murmur heard. Mildly tachycardic  Pulmonary/Chest: Effort normal and breath sounds normal. She exhibits tenderness.  Chest tender at sternum, reproducing pain exactly.  Abdominal: Soft. Bowel sounds are normal. She exhibits no distension. There is no tenderness.  Obese  Musculoskeletal: Normal range of motion. She exhibits no edema and no tenderness.  Neurological: She is alert. Coordination normal.  Skin: Skin is warm and dry. No rash noted.  Psychiatric: She has a normal mood and affect.    Date: 05/02/2013  Rate: 110  Rhythm: sinus tachycardia  QRS Axis: right  Intervals: normal  ST/T Wave abnormalities: nonspecific T wave changes  Conduction Disutrbances:none  Narrative Interpretation:   Old EKG Reviewed: Unchanged from 04/09/2013 interpreted by me  ED Course  Procedures (including critical care time) Labs Review Labs Reviewed  CBC  BASIC METABOLIC PANEL  D-DIMER, QUANTITATIVE   Imaging Review Dg Chest 2 View  05/02/2013   *RADIOLOGY REPORT*  Clinical Data: Productive cough  CHEST - 2 VIEW  Comparison: April 09, 2013  Findings: There is no focal  infiltrate, pulmonary edema, or pleural effusion.  The mediastinal contour and cardiac silhouette are stable.  The soft tissues and osseous structures are stable.  IMPRESSION: No acute cardiopulmonary disease identified.   Original Report Authenticated By: Sherian Rein, M.D.   chest x-ray reviewed by me 5 PM pain improved after treatment with Norco. Patient be administered potassium chloride 40 mg by mouth prior to discharge. Results for orders placed during the hospital encounter of 05/02/13  CBC      Result Value Range   WBC 4.5  4.0 - 10.5 K/uL   RBC 4.45  3.87 - 5.11 MIL/uL   Hemoglobin 11.0 (*) 12.0 - 15.0 g/dL   HCT 40.9 (*) 81.1 - 91.4 %   MCV 73.3 (*) 78.0 - 100.0 fL   MCH 24.7 (*) 26.0 - 34.0 pg   MCHC 33.7  30.0 - 36.0 g/dL   RDW 78.2 (*) 95.6 - 21.3 %   Platelets 282  150 - 400 K/uL  BASIC METABOLIC PANEL      Result Value Range   Sodium 137  135 - 145 mEq/L   Potassium 3.3 (*) 3.5 - 5.1 mEq/L   Chloride 101  96 - 112 mEq/L   CO2 25  19 - 32 mEq/L   Glucose, Bld 111 (*) 70 - 99 mg/dL   BUN 9  6 - 23 mg/dL   Creatinine, Ser 0.86  0.50 - 1.10 mg/dL   Calcium 9.4  8.4 - 57.8 mg/dL   GFR calc non Af Amer >90  >90 mL/min   GFR calc Af Amer >90  >90 mL/min  D-DIMER, QUANTITATIVE      Result Value Range   D-Dimer, Quant <0.27  0.00 - 0.48 ug/mL-FEU  POCT I-STAT TROPONIN I      Result Value Range   Troponin i, poc 0.00  0.00 - 0.08 ng/mL   Comment 3            Dg Chest 2 View  05/02/2013   *RADIOLOGY REPORT*  Clinical Data: Productive cough  CHEST - 2 VIEW  Comparison: April 09, 2013  Findings: There is no focal infiltrate, pulmonary edema, or pleural effusion.  The mediastinal contour and cardiac silhouette are stable.  The soft tissues and osseous structures are stable.  IMPRESSION: No acute cardiopulmonary disease identified.   Original Report Authenticated By: Sherian Rein, M.D.   Dg Chest 2 View  04/09/2013   *RADIOLOGY REPORT*  Clinical Data: Mid chest pain.  CHEST -  2 VIEW  Comparison: Chest x-ray 06/20/2012.  Findings: Lung volumes are low.  No consolidative airspace disease. No definite pleural effusion.  No evidence of pulmonary edema. Mild cardiomegaly is unchanged.  Upper mediastinal contours are unremarkable.  IMPRESSION: 1.  Low lung volumes  without radiographic evidence of acute cardiopulmonary disease. 2.  Mild cardiomegaly.   Original Report Authenticated By: Trudie Reed, M.D.    MDM  No diagnosis found. Pretest clinical probability for pulmonary embolism is low based on Wells criteria and clinical suspicion  Exam most consistent with musculoskeletal chest pain strongly doubt acute coronary syndrome based on symptoms and exam and laboratory evidence. Plan prescription Norco. Patient has appointment with her PMD on 05/06/2013 which is encouraged to keep. Blood pressure recheck Diagnosis #1 musculoskeletal chest pain #2 hypokalemia #3 hypertension #4 anemia  Doug Sou, MD 05/02/13 [phone]

## 2013-05-02 NOTE — ED Notes (Signed)
Pt is here with left dull chest pain that radiates to left shoulder and has pain with deep breath.  Pt denies long travel and does report cough.

## 2013-05-20 ENCOUNTER — Encounter (HOSPITAL_COMMUNITY): Payer: Self-pay | Admitting: Emergency Medicine

## 2013-05-20 ENCOUNTER — Emergency Department (INDEPENDENT_AMBULATORY_CARE_PROVIDER_SITE_OTHER)
Admission: EM | Admit: 2013-05-20 | Discharge: 2013-05-20 | Disposition: A | Discharge status: Home / Self Care | Payer: No Typology Code available for payment source | Source: Home / Self Care | Attending: Emergency Medicine | Admitting: Emergency Medicine

## 2013-05-20 DIAGNOSIS — J209 Acute bronchitis, unspecified: Secondary | ICD-10-CM

## 2013-05-20 DIAGNOSIS — J208 Acute bronchitis due to other specified organisms: Secondary | ICD-10-CM

## 2013-05-20 DIAGNOSIS — J029 Acute pharyngitis, unspecified: Secondary | ICD-10-CM

## 2013-05-20 LAB — POCT RAPID STREP A: Streptococcus, Group A Screen (Direct): NEGATIVE

## 2013-05-20 MED ORDER — ALBUTEROL SULFATE HFA 108 (90 BASE) MCG/ACT IN AERS: 1.0000 | INHALATION_SPRAY | Freq: Four times a day (QID) | RESPIRATORY_TRACT | Status: DC | PRN

## 2013-05-20 MED ORDER — GUAIFENESIN-CODEINE 100-10 MG/5ML PO SYRP: 10.0000 mL | ORAL_SOLUTION | Freq: Four times a day (QID) | ORAL | Status: DC | PRN

## 2013-05-20 MED ORDER — TRAMADOL HCL 50 MG PO TABS: 100.0000 mg | ORAL_TABLET | Freq: Three times a day (TID) | ORAL | Status: DC | PRN

## 2013-05-20 MED ORDER — PREDNISONE 20 MG PO TABS: 20.0000 mg | ORAL_TABLET | Freq: Two times a day (BID) | ORAL | Status: DC

## 2013-05-20 NOTE — ED Provider Notes (Signed)
Chief Complaint:   Chief Complaint  Patient presents with  . Sore Throat    History of Present Illness:   [name] is a 37 year old female who has had a one half week history of a sore throat on the left, cough productive of white sputum, wheezing, left ear pain, has felt hot and cold, achy all over, and had some headache. She denies any fever, nasal congestion, rhinorrhea, chest pain, or GI symptoms. No sick exposures.  Review of Systems:  Other than noted above, the patient denies any of the following symptoms: Systemic:  No fevers, chills, sweats, weight loss or gain, fatigue, or tiredness. Eye:  No redness or discharge. ENT:  No ear pain, drainage, headache, nasal congestion, drainage, sinus pressure, difficulty swallowing, or sore throat. Neck:  No neck pain or swollen glands. Lungs:  No cough, sputum production, hemoptysis, wheezing, chest tightness, shortness of breath or chest pain. GI:  No abdominal pain, nausea, vomiting or diarrhea.  PMFSH:  Past medical history, family history, social history, meds, and allergies were reviewed. She is allergic to Septra. She takes Norvasc and Plaquenil and has high blood pressure and lupus.  Physical Exam:   Vital signs:  BP 161/81  Pulse 87  Temp(Src) 99 F (37.2 C) (Oral)  Resp 20  SpO2 100%  LMP 05/20/2013 General:  Alert and oriented.  In no distress.  Skin warm and dry. Eye:  No conjunctival injection or drainage. Lids were normal. ENT:  TMs and canals were normal, without erythema or inflammation.  Nasal mucosa was clear and uncongested, without drainage.  Mucous membranes were moist.  Pharynx was clear with no exudate or drainage.  There were no oral ulcerations or lesions. Neck:  Supple, no adenopathy, tenderness or mass. Lungs:  No respiratory distress.  Lungs were clear to auscultation, without wheezes, rales or rhonchi.  Breath sounds were clear and equal bilaterally.  Heart:  Regular rhythm, without gallops, murmers or  rubs. Skin:  Clear, warm, and dry, without rash or lesions.  Labs:   Results for orders placed during the hospital encounter of 05/20/13  POCT RAPID STREP A ([facility] CARE ONLY)      Result Value Range   Streptococcus, Group A Screen (Direct) NEGATIVE  NEGATIVE     Assessment:  The primary encounter diagnosis was Viral bronchitis. A diagnosis of Viral pharyngitis was also pertinent to this visit.  No indication for antibiotics.  Plan:   1.  Meds:  The following meds were prescribed:   Discharge Medication List as of 05/20/2013 10:52 AM    START taking these medications   Details  albuterol (PROVENTIL HFA;VENTOLIN HFA) 108 (90 BASE) MCG/ACT inhaler Inhale 1-2 puffs into the lungs every 6 (six) hours as needed for wheezing., Starting 05/20/2013, Until Discontinued, Normal    guaiFENesin-codeine (GUIATUSS AC) 100-10 MG/5ML syrup Take 10 mLs by mouth 4 (four) times daily as needed for cough., Starting 05/20/2013, Until Discontinued, Print    predniSONE (DELTASONE) 20 MG tablet Take 1 tablet (20 mg total) by mouth 2 (two) times daily., Starting 05/20/2013, Until Discontinued, Normal    traMADol (ULTRAM) 50 MG tablet Take 2 tablets (100 mg total) by mouth every 8 (eight) hours as needed for pain., Starting 05/20/2013, Until Discontinued, Normal        2.  Patient Education/Counseling:  The patient was given appropriate handouts, self care instructions, and instructed in symptomatic relief.    3.  Follow up:  The patient was told to follow  up if no better in 3 to 4 days, if becoming worse in any way, and given some red flag symptoms such as increasing fever or difficulty breathing which would prompt immediate return.  Follow up here if necessary.      Reuben Likes, MD 05/20/13 [phone]

## 2013-05-20 NOTE — ED Notes (Signed)
Pt  Reports  Symptoms  Of  sorethroat   With     Earache        And  Cough  With  Onset  Of  Symptoms     3  Days  Ago  -      Pt  Sitting  Upright on  Exam  Table  Speaking in  Complete  sentances  And  Is  In no  Acute  Distress

## 2013-05-22 LAB — CULTURE, GROUP A STREP

## 2013-05-25 ENCOUNTER — Encounter: Payer: Self-pay | Admitting: Obstetrics & Gynecology

## 2013-05-25 ENCOUNTER — Ambulatory Visit (INDEPENDENT_AMBULATORY_CARE_PROVIDER_SITE_OTHER): Payer: No Typology Code available for payment source | Admitting: Obstetrics & Gynecology

## 2013-05-25 VITALS — BP 147/100 | HR 100 | Temp 97.2°F | Ht 60.0 in | Wt 231.8 lb

## 2013-05-25 DIAGNOSIS — Z23 Encounter for immunization: Secondary | ICD-10-CM

## 2013-05-25 DIAGNOSIS — Z01419 Encounter for gynecological examination (general) (routine) without abnormal findings: Secondary | ICD-10-CM

## 2013-05-25 DIAGNOSIS — Z01812 Encounter for preprocedural laboratory examination: Secondary | ICD-10-CM

## 2013-05-25 DIAGNOSIS — Z3049 Encounter for surveillance of other contraceptives: Secondary | ICD-10-CM

## 2013-05-25 DIAGNOSIS — N852 Hypertrophy of uterus: Secondary | ICD-10-CM

## 2013-05-25 LAB — POCT PREGNANCY, URINE
Preg Test, Ur: NEGATIVE
Preg Test, Ur: NEGATIVE

## 2013-05-25 MED ORDER — MEDROXYPROGESTERONE ACETATE 104 MG/0.65ML ~~LOC~~ SUSP
104.0000 mg | Freq: Once | SUBCUTANEOUS | Status: AC
  Administered 2013-05-25: 104 mg via SUBCUTANEOUS

## 2013-05-25 NOTE — Addendum Note (Signed)
Addended by: Faythe Casa on: 05/25/2013 05:04 PM   Modules accepted: Orders

## 2013-05-25 NOTE — Patient Instructions (Signed)
Uterine Fibroid A uterine fibroid is a growth (tumor) that occurs in a woman's uterus. This type of tumor is not cancerous and does not spread out of the uterus. A woman can have one or many fibroids, and the fiboid(s) can become quite large. A fibroid can vary in size, weight, and where it grows in the uterus. Most fibroids do not require medical treatment, but some can cause pain or heavy bleeding during and between periods. CAUSES  A fibroid is the result of a single uterine cell that keeps growing (unregulated), which is different than most cells in the human body. Most cells have a control mechanism that keeps them from reproducing without control.  SYMPTOMS   Bleeding.  Pelvic pain and pressure.  Bladder problems due to the size of the fibroid.  Infertility and miscarriages depending on the size and location of the fibroid. DIAGNOSIS  A diagnosis is made by physical exam. Your caregiver may feel the lumpy tumors during a pelvic exam. Important information regarding size, location, and number of tumors can be gained by having an ultrasound. It is rare that other tests, such as a CT scan or MRI, are needed. TREATMENT   Your caregiver may recommend watchful waiting. This involves getting the fibroid checked by your caregiver to see if the fibroids grow or shrink.   Hormonal treatment or an intrauterine device (IUD) may be prescribed.   Surgery may be needed to remove the fibroids (myomectomy) or the uterus (hysterectomy). This depends on your situation. When fibroids interfere with fertility and a woman wants to become pregnant, a caregiver may recommend having the fibroids removed.  HOME CARE INSTRUCTIONS  Home care depends on how you were treated. In general:   Keep all follow-up appointments with your caregiver.   Only take medicine as told by your caregiver. Do not take aspirin. It can cause bleeding.   If you have excessive periods and soak tampons or pads in a half hour or  less, contact your caregiver immediately. If your periods are troublesome but not so heavy, lie down with your feet raised slightly above your heart. Place cold packs on your lower abdomen.   If your periods are heavy, write down the number of pads or tampons you use per month. Bring this information to your caregiver.   Talk to your caregiver about taking iron pills.   Include green vegetables in your diet.   If you were prescribed a hormonal treatment, take the hormonal medicines as directed.   If you need surgery, ask your caregiver for information on your specific surgery.  SEEK IMMEDIATE MEDICAL CARE IF:  You have pelvic pain or cramps not controlled with medicines.   You have a sudden increase in pelvic pain.   You have an increase of bleeding between and during periods.   You feel lightheaded or have fainting episodes.  MAKE SURE YOU:  Understand these instructions.  Will watch your condition.  Will get help right away if you are not doing well or get worse. Document Released: 07/27/2000 Document Revised: 10/22/2011 Document Reviewed: 08/20/2011 [employer] Patient Information 2014 [employer].  

## 2013-05-25 NOTE — Progress Notes (Signed)
Pt. Would like to have her yearly check up today; states she has had abnormal paps in the past. Pt. Would also like to start depo; has been off of it for about a year.  BP high; pt. States she took her BP medications an hour ago.

## 2013-05-25 NOTE — Progress Notes (Signed)
Patient ID: [name], female   DOB: July 06, 1976, 37 y.o.   MRN: [id number] Subjective:     [name] is a 37 y.o. female here for a routine exam.  Current complaints: pt c/o heavier cycles and wants to start Depo provera.  Did not f/u for her abnormal PAP   Gynecologic History Patient's last menstrual period was 05/13/2013. Last Pap: 2012. Results were: abnormal: LGSIL Last mammogram: never had.   Obstetric History OB History  Gravida Para Term Preterm AB SAB TAB Ectopic Multiple Living  5 2 0 2 3 2 1 0 0 2     # Outcome Date GA Lbr Len/2nd Weight Sex Delivery Anes PTL Lv  5 SAB           4 SAB  [redacted]w[redacted]d   M         Comments: iufd  3 TAB           2 PRE  [redacted]w[redacted]d 17:00        1 PRE  [redacted]w[redacted]d 06:00 6 lb 3 oz (2.807 kg) M SVD None         The following portions of the patient's history were reviewed and updated as appropriate: allergies, current medications, past family history, past medical history, past social history, past surgical history and problem list.  Review of Systems A comprehensive review of systems was negative.    Objective:    BP 147/100  Pulse 100  Temp(Src) 97.2 F (36.2 C) (Oral)  Ht 5' (1.524 m)  Wt 231 lb 12.8 oz (105.144 kg)  BMI 45.27 kg/m2  LMP 05/13/2013  General Appearance:    Alert, cooperative, no distress, appears stated age  Head:    Normocephalic, without obvious abnormality, atraumatic           Throat:   Lips, mucosa, and tongue normal; teeth and gums normal  Neck:   Supple, symmetrical, trachea midline, no adenopathy;    thyroid:  no enlargement/tenderness/nodules; no carotid   bruit or JVD  Back:     Symmetric, no curvature, ROM normal, no CVA tenderness  Lungs:     Clear to auscultation bilaterally, respirations unlabored  Chest Wall:    No tenderness or deformity   Heart:    Regular rate and rhythm, S1 and S2 normal, no murmur, rub   or gallop  Breast Exam:    No tenderness, masses, or nipple abnormality  Abdomen:      Soft, non-tender, bowel sounds active all four quadrants,    no masses, no organomegaly  Genitalia:    Normal female without lesion, discharge or tenderness uterus; enlarged ~16 weeks sized- exam limited by body habitus      Extremities:   Extremities normal, atraumatic, no cyanosis or edema  Pulses:   2+ and symmetric all extremities  Skin:   Skin color, texture, turgor normal, no rashes or lesions  Lymph nodes:   Cervical, supraclavicular, and axillary nodes normal         Assessment:    Healthy female exam.  Contraception counseling- wants to restart Depo Provera Enlarged uterus- suspect uterine fibroids   Plan:    Follow up in: 4 weeks.   Flu shot today Pelvic sono Depo Provera per patient request

## 2013-06-01 ENCOUNTER — Ambulatory Visit (HOSPITAL_COMMUNITY): Payer: No Typology Code available for payment source | Attending: Obstetrics & Gynecology

## 2013-06-05 ENCOUNTER — Telehealth: Payer: Self-pay | Admitting: *Deleted

## 2013-06-05 NOTE — Telephone Encounter (Signed)
Called [name] and left a message we are calling with results and an appointment- please call clinic. We are closed at 12 today until Monday am.

## 2013-06-05 NOTE — Telephone Encounter (Signed)
Message copied by Gerome Apley on Fri Jun 05, 2013 10:59 AM ------      Message from: Odelia Gage A      Created: Tue Jun 02, 2013  3:27 PM        11/13 @ 1:45 appointment time.                  ----- Message -----         From: Drucilla Schmidt Day, RN         Sent: 06/01/2013   4:23 PM           To: [facility] Admin Pool            Please schedule colpo appt then send back to clinical pool. We will notify her of Pap results and colpo appt.  Thanks       ----- Message -----         From: Willodean Rosenthal, MD         Sent: 05/26/2013   7:32 PM           To: [facility] Clinical Pool            Please call pt.  She had a LGSIL pap.  She did not f/u her last pap which was abnormal 2 years prev.  She needs a colpo.  Please stress to her the importance of f/u.            Thx,      clh-S             ------

## 2013-06-10 NOTE — Telephone Encounter (Signed)
Called patient, no answer- left message stating we are calling with some information and an appt, please call us back at the clinics.

## 2013-06-11 ENCOUNTER — Telehealth: Payer: Self-pay | Admitting: *Deleted

## 2013-06-11 ENCOUNTER — Encounter: Payer: Self-pay | Admitting: *Deleted

## 2013-06-11 NOTE — Telephone Encounter (Signed)
Called [name] and left a message we are calling with some important information and have left several messages- please call clinic. Also called contact information- and verified we have correct number for her- which we do. Also asked him to tell her we have been trying to reach her- please call clinic.   Will send letter .

## 2013-06-11 NOTE — Telephone Encounter (Signed)
[name] called and I gave her the results of her abnormal papsmear and appointment for her colopscopy.  No concerns voiced and voices understanding.  Will not send letter since she returned our call.

## 2013-06-15 ENCOUNTER — Ambulatory Visit (HOSPITAL_COMMUNITY): Payer: No Typology Code available for payment source | Attending: Obstetrics & Gynecology

## 2013-06-25 ENCOUNTER — Encounter: Payer: Self-pay | Admitting: *Deleted

## 2013-06-25 ENCOUNTER — Encounter: Payer: Self-pay | Admitting: Obstetrics & Gynecology

## 2013-06-25 ENCOUNTER — Other Ambulatory Visit (HOSPITAL_COMMUNITY)
Admission: RE | Admit: 2013-06-25 | Discharge: 2013-06-25 | Disposition: A | Discharge status: Home / Self Care | Payer: No Typology Code available for payment source | Source: Ambulatory Visit | Attending: Obstetrics & Gynecology | Admitting: Obstetrics & Gynecology

## 2013-06-25 ENCOUNTER — Ambulatory Visit (INDEPENDENT_AMBULATORY_CARE_PROVIDER_SITE_OTHER): Payer: No Typology Code available for payment source | Admitting: Obstetrics & Gynecology

## 2013-06-25 VITALS — BP 132/78 | HR 106 | Temp 98.0°F | Ht 60.0 in | Wt 231.3 lb

## 2013-06-25 DIAGNOSIS — Z9889 Other specified postprocedural states: Secondary | ICD-10-CM

## 2013-06-25 DIAGNOSIS — IMO0002 Reserved for concepts with insufficient information to code with codable children: Secondary | ICD-10-CM

## 2013-06-25 DIAGNOSIS — R87612 Low grade squamous intraepithelial lesion on cytologic smear of cervix (LGSIL): Secondary | ICD-10-CM | POA: Insufficient documentation

## 2013-06-25 LAB — POCT PREGNANCY, URINE: Preg Test, Ur: NEGATIVE

## 2013-06-25 NOTE — Patient Instructions (Signed)
Endocervical Curettage Endocervical curettage is a procedure to obtain a tissue sample from your endocervical canal to look for abnormal cells. This procedure is sometimes done as part of an exam called a colposcopy, in which your health care provider takes a close look at the surface of your cervix because of an abnormal Pap test result or the presence of genital warts, bleeding, or pain. LET [facility] CARE PROVIDER KNOW ABOUT:  Recent vaginal infections you have had.  Recent menstrual periods or bleeding you have had.  Any allergies you have.  All medicines you are taking, including vitamins, herbs, eye drops, creams, and over-the-counter medicines.  Previous problems you or members of your family have had with the use of anesthetics.  Any blood disorders you have.  Previous surgeries you have had.  Medical conditions you have. RISKS AND COMPLICATIONS  Generally, endocervical curettage is a safe procedure. However, as with any procedure, complications can occur. Possible complications include:  Excessive bleeding.  Infection.  Injury to surrounding organs.  Allergic reaction to anesthetics or medicines. BEFORE THE PROCEDURE  Do not take aspirin or blood thinners for a week before the procedure. These can cause bleeding.  Do not douche or use tampons for at least 3 days before the procedure.  Do not have sexual intercourse for at least 3 days before the procedure.  Arrange for someone to take you home after the procedure. PROCEDURE   For this procedure, you will be asked to undress from the waist down. You will need to lie on an exam table with your feet in stirrups. Your legs and belly will be covered with a sheet.  A warm metal or plastic instrument (speculum) will be placed in your vagina to keep it open and to allow your health care provider to see inside your vagina.  A medicine that numbs the area (local anesthetic) may be used.  A sharp curved instrument will  be used to scrape cells from your cervix or endocervix.  Medicines may be put on the surface of the tissue to stop any bleeding.  The scraped tissue sample is sent to the lab to see if there are any abnormalities. AFTER THE PROCEDURE   You may have some mild cramping pain.  You will rest in the office or clinic until you are stable and feel ready to go home. Document Released: 05/08/2008 Document Revised: 04/01/2013 Document Reviewed: 02/26/2013 [facility] Patient Information 2014 [place], [place]. Colposcopy Colposcopy is a procedure to examine your cervix and vagina, or the area around the outside of your vagina, for abnormalities or signs of disease. The procedure is done using a lighted microscope called a colposcope. Tissue samples may be collected during the colposcopy if your health care provider finds any unusual cells. A colposcopy may be done if a woman has:  An abnormal Pap test. A Pap test is a medical test done to evaluate cells that are on the surface of the cervix.  A Pap test result that is suggestive of human papillomavirus (HPV). This virus can cause genital warts and is linked to the development of cervical cancer.  A sore on her cervix and the results of a Pap test were normal.  Genital warts on the cervix or in or around the outside of the vagina.  A mother who took the drug diethylstilbestrol (DES) while pregnant.  Painful intercourse.  Vaginal bleeding, especially after sexual intercourse. LET [facility] CARE PROVIDER KNOW ABOUT:  Any allergies you have.  All medicines you  are taking, including vitamins, herbs, eye drops, creams, and over-the-counter medicines.  Previous problems you or members of your family have had with the use of anesthetics.  Any blood disorders you have.  Previous surgeries you have had.  Medical conditions you have. RISKS AND COMPLICATIONS Generally, a colposcopy is a safe procedure. However, as with any procedure, complications  can occur. Possible complications include:  Bleeding.  Infection.  Missed lesions. BEFORE THE PROCEDURE   Tell your health care provider if you have your menstrual period. A colposcopy typically is not done during menstruation.  For 24 hours before the colposcopy, do not:  Douche.  Use tampons.  Use medicines, creams, or suppositories in the vagina.  Have sexual intercourse. PROCEDURE  During the procedure, you will be lying on your back with your feet in foot rests (stirrups). A warm metal or plastic instrument (speculum) will be placed in your vagina to keep it open and to allow the health care provider to see the cervix. The colposcope will be placed outside the vagina. It will be used to magnify and examine the cervix, vagina, and the area around the outside of the vagina. A small amount of liquid solution will be placed on the area that is to be viewed. This solution will make it easier to see the abnormal cells. Your health care provider will use tools to suck out mucus and cells from the canal of the cervix. Then he or she will record the location of the abnormal areas. If a biopsy is done during the procedure, a medicine will usually be given to numb the area (local anesthetic). You may feel mild pain or cramping while the biopsy is done. After the procedure, tissue samples collected during the biopsy will be sent to a lab for analysis. AFTER THE PROCEDURE  You will be given instructions on when to follow up with your health care provider for your test results. It is important to keep your appointment. Document Released: 10/20/2002 Document Revised: 04/01/2013 Document Reviewed: 02/26/2013 [facility] Patient Information 2014 [place], [place].

## 2013-06-25 NOTE — Progress Notes (Signed)
Patient ID: [name], female   DOB: May 14, 1976, 37 y.o.   MRN: [id number] Patient given informed consent, signed copy in the chart, time out was performed.  Placed in lithotomy position. Cervix viewed with speculum and colposcope after application of acetic acid.  05/25/2013 Diagnosis LOW GRADE SQUAMOUS INTRAEPITHELIAL LESION: CIN-1/ HPV (LSIL). Colposcopy adequate? yes Acetowhite lesions?no Punctation?no Mosaicism?  no Abnormal vasculature?  no Biopsies? no ECC?yes  Patient was given post procedure instructions.  She will return in 6months for repeat PAP with HPV.  Maki Sweetser L. Harraway-Smith, M.D., [facility]

## 2013-06-30 ENCOUNTER — Telehealth: Payer: Self-pay

## 2013-06-30 NOTE — Telephone Encounter (Signed)
Called pt. To inform her of her pap results and HPV. Informed pt. That HPV is a sexually transmitted virus and with that there is an increased risk of developing abnormal cells. Reassured pt. That all this means is that we need to monitor more frequently with a pap repeated in one year. Pt. Verbalized understanding and stated she had no further questions or concerns. Advised pt. To call in September 2015 to schedule her yearly.

## 2013-06-30 NOTE — Telephone Encounter (Signed)
Message copied by Louanna Raw on Tue Jun 30, 2013  4:41 PM ------      Message from: [name]      Created: Mon Jun 29, 2013  7:07 PM       Please call pt.  F/u 1 year for PAP with HPV.            Thx,            clh-S       ------

## 2013-07-15 ENCOUNTER — Emergency Department (HOSPITAL_COMMUNITY): Payer: No Typology Code available for payment source

## 2013-07-15 ENCOUNTER — Encounter (HOSPITAL_COMMUNITY): Payer: Self-pay | Admitting: Emergency Medicine

## 2013-07-15 ENCOUNTER — Observation Stay (HOSPITAL_COMMUNITY)
Admission: EM | Admit: 2013-07-15 | Discharge: 2013-07-16 | Disposition: A | Discharge status: Home / Self Care | Payer: No Typology Code available for payment source | Attending: Internal Medicine | Admitting: Internal Medicine

## 2013-07-15 DIAGNOSIS — N039 Chronic nephritic syndrome with unspecified morphologic changes: Secondary | ICD-10-CM | POA: Insufficient documentation

## 2013-07-15 DIAGNOSIS — D649 Anemia, unspecified: Secondary | ICD-10-CM | POA: Insufficient documentation

## 2013-07-15 DIAGNOSIS — M329 Systemic lupus erythematosus, unspecified: Secondary | ICD-10-CM | POA: Insufficient documentation

## 2013-07-15 DIAGNOSIS — I161 Hypertensive emergency: Secondary | ICD-10-CM | POA: Diagnosis present

## 2013-07-15 DIAGNOSIS — R51 Headache: Secondary | ICD-10-CM

## 2013-07-15 DIAGNOSIS — J45909 Unspecified asthma, uncomplicated: Secondary | ICD-10-CM | POA: Insufficient documentation

## 2013-07-15 DIAGNOSIS — I10 Essential (primary) hypertension: Principal | ICD-10-CM | POA: Insufficient documentation

## 2013-07-15 DIAGNOSIS — N390 Urinary tract infection, site not specified: Secondary | ICD-10-CM

## 2013-07-15 DIAGNOSIS — H539 Unspecified visual disturbance: Secondary | ICD-10-CM

## 2013-07-15 DIAGNOSIS — I160 Hypertensive urgency: Secondary | ICD-10-CM | POA: Diagnosis present

## 2013-07-15 DIAGNOSIS — IMO0002 Reserved for concepts with insufficient information to code with codable children: Secondary | ICD-10-CM | POA: Diagnosis present

## 2013-07-15 DIAGNOSIS — E669 Obesity, unspecified: Secondary | ICD-10-CM | POA: Insufficient documentation

## 2013-07-15 LAB — CBC
HCT: 33.6 % — ABNORMAL LOW (ref 36.0–46.0)
Hemoglobin: 11.3 g/dL — ABNORMAL LOW (ref 12.0–15.0)
MCH: 24.8 pg — ABNORMAL LOW (ref 26.0–34.0)
MCHC: 33.6 g/dL (ref 30.0–36.0)
MCV: 73.8 fL — ABNORMAL LOW (ref 78.0–100.0)
Platelets: 264 10*3/uL (ref 150–400)
RBC: 4.55 MIL/uL (ref 3.87–5.11)
RDW: 15.7 % — ABNORMAL HIGH (ref 11.5–15.5)
WBC: 3.6 10*3/uL — ABNORMAL LOW (ref 4.0–10.5)

## 2013-07-15 LAB — COMPREHENSIVE METABOLIC PANEL
ALT: 34 U/L (ref 0–35)
AST: 24 U/L (ref 0–37)
Albumin: 3.4 g/dL — ABNORMAL LOW (ref 3.5–5.2)
Alkaline Phosphatase: 62 U/L (ref 39–117)
BUN: 9 mg/dL (ref 6–23)
CO2: 24 mEq/L (ref 19–32)
Calcium: 9.1 mg/dL (ref 8.4–10.5)
Chloride: 103 mEq/L (ref 96–112)
Creatinine, Ser: 0.69 mg/dL (ref 0.50–1.10)
GFR calc Af Amer: >90 mL/min (ref >90)
GFR calc non Af Amer: >90 mL/min (ref >90)
Glucose, Bld: 142 mg/dL — ABNORMAL HIGH (ref 70–99)
Potassium: 3.5 mEq/L (ref 3.5–5.1)
Sodium: 138 mEq/L (ref 135–145)
Total Bilirubin: 0.4 mg/dL (ref 0.3–1.2)
Total Protein: 8 g/dL (ref 6.0–8.3)

## 2013-07-15 LAB — CREATININE, SERUM
Creatinine, Ser: 0.62 mg/dL (ref 0.50–1.10)
GFR calc Af Amer: >90 mL/min (ref >90)
GFR calc non Af Amer: >90 mL/min (ref >90)

## 2013-07-15 LAB — POCT I-STAT TROPONIN I: Troponin i, poc: 0.01 ng/mL (ref 0.00–0.08)

## 2013-07-15 LAB — URINALYSIS, ROUTINE W REFLEX MICROSCOPIC
Bilirubin Urine: NEGATIVE
Glucose, UA: NEGATIVE mg/dL
Hgb urine dipstick: NEGATIVE
Ketones, ur: NEGATIVE mg/dL
Nitrite: NEGATIVE
Protein, ur: NEGATIVE mg/dL
Specific Gravity, Urine: 1.015 (ref 1.005–1.030)
Urobilinogen, UA: 1 mg/dL (ref 0.0–1.0)
pH: 6 (ref 5.0–8.0)

## 2013-07-15 LAB — CBC WITH DIFFERENTIAL/PLATELET
Basophils Absolute: 0 10*3/uL (ref 0.0–0.1)
Basophils Relative: 0 % (ref 0–1)
Eosinophils Absolute: 0 10*3/uL (ref 0.0–0.7)
Eosinophils Relative: 1 % (ref 0–5)
HCT: 33.7 % — ABNORMAL LOW (ref 36.0–46.0)
Hemoglobin: 11.5 g/dL — ABNORMAL LOW (ref 12.0–15.0)
Lymphocytes Relative: 33 % (ref 12–46)
Lymphs Abs: 1.4 10*3/uL (ref 0.7–4.0)
MCH: 24.9 pg — ABNORMAL LOW (ref 26.0–34.0)
MCHC: 34.1 g/dL (ref 30.0–36.0)
MCV: 73.1 fL — ABNORMAL LOW (ref 78.0–100.0)
Monocytes Absolute: 0.4 10*3/uL (ref 0.1–1.0)
Monocytes Relative: 9 % (ref 3–12)
Neutro Abs: 2.4 10*3/uL (ref 1.7–7.7)
Neutrophils Relative %: 57 % (ref 43–77)
Platelets: 268 10*3/uL (ref 150–400)
RBC: 4.61 MIL/uL (ref 3.87–5.11)
RDW: 15.4 % (ref 11.5–15.5)
WBC: 4.2 10*3/uL (ref 4.0–10.5)

## 2013-07-15 LAB — RAPID URINE DRUG SCREEN, HOSP PERFORMED
Amphetamines: NOT DETECTED
Barbiturates: NOT DETECTED
Benzodiazepines: NOT DETECTED
Cocaine: NOT DETECTED
Opiates: NOT DETECTED
Tetrahydrocannabinol: POSITIVE — AB

## 2013-07-15 LAB — URINE MICROSCOPIC-ADD ON

## 2013-07-15 MED ORDER — HYDROXYCHLOROQUINE SULFATE 200 MG PO TABS
200.0000 mg | ORAL_TABLET | Freq: Two times a day (BID) | ORAL | Status: DC
  Administered 2013-07-15 – 2013-07-16 (×2): 200 mg via ORAL
  Filled 2013-07-15 (×3): qty 1

## 2013-07-15 MED ORDER — CLONIDINE HCL 0.1 MG PO TABS
0.1000 mg | ORAL_TABLET | Freq: Three times a day (TID) | ORAL | Status: DC
  Administered 2013-07-15 – 2013-07-16 (×3): 0.1 mg via ORAL
  Filled 2013-07-15 (×4): qty 1

## 2013-07-15 MED ORDER — ONDANSETRON HCL 4 MG PO TABS
4.0000 mg | ORAL_TABLET | Freq: Four times a day (QID) | ORAL | Status: DC | PRN
  Administered 2013-07-16: 4 mg via ORAL
  Filled 2013-07-15: qty 1

## 2013-07-15 MED ORDER — SODIUM CHLORIDE 0.9 % IJ SOLN: 3.0000 mL | Freq: Two times a day (BID) | INTRAMUSCULAR | Status: DC

## 2013-07-15 MED ORDER — GADOBENATE DIMEGLUMINE 529 MG/ML IV SOLN
20.0000 mL | Freq: Once | INTRAVENOUS | Status: AC
  Administered 2013-07-15: 20 mL via INTRAVENOUS

## 2013-07-15 MED ORDER — SODIUM CHLORIDE 0.9 % IJ SOLN
3.0000 mL | Freq: Two times a day (BID) | INTRAMUSCULAR | Status: DC
  Administered 2013-07-15 – 2013-07-16 (×2): 3 mL via INTRAVENOUS

## 2013-07-15 MED ORDER — TRAMADOL HCL 50 MG PO TABS
100.0000 mg | ORAL_TABLET | Freq: Four times a day (QID) | ORAL | Status: DC | PRN
  Administered 2013-07-15: 100 mg via ORAL
  Filled 2013-07-15: qty 2

## 2013-07-15 MED ORDER — AMLODIPINE BESYLATE 10 MG PO TABS
10.0000 mg | ORAL_TABLET | Freq: Every day | ORAL | Status: DC
  Administered 2013-07-16: 10 mg via ORAL
  Filled 2013-07-15: qty 1

## 2013-07-15 MED ORDER — LABETALOL HCL 5 MG/ML IV SOLN
5.0000 mg | INTRAVENOUS | Status: DC | PRN
  Administered 2013-07-15 (×2): 5 mg via INTRAVENOUS
  Filled 2013-07-15 (×2): qty 4

## 2013-07-15 MED ORDER — ONDANSETRON HCL 4 MG/2ML IJ SOLN
4.0000 mg | Freq: Four times a day (QID) | INTRAMUSCULAR | Status: DC | PRN
  Administered 2013-07-16: 4 mg via INTRAVENOUS
  Filled 2013-07-15: qty 2

## 2013-07-15 MED ORDER — ALBUTEROL SULFATE HFA 108 (90 BASE) MCG/ACT IN AERS
1.0000 | INHALATION_SPRAY | Freq: Four times a day (QID) | RESPIRATORY_TRACT | Status: DC | PRN
  Filled 2013-07-15: qty 6.7

## 2013-07-15 MED ORDER — LABETALOL HCL 5 MG/ML IV SOLN
10.0000 mg | INTRAVENOUS | Status: DC | PRN
  Administered 2013-07-16: 10 mg via INTRAVENOUS
  Filled 2013-07-15: qty 4

## 2013-07-15 MED ORDER — MORPHINE SULFATE 4 MG/ML IJ SOLN
4.0000 mg | Freq: Once | INTRAMUSCULAR | Status: AC
Start: 2013-07-15 — End: 2013-07-15
  Administered 2013-07-15: 4 mg via INTRAVENOUS
  Filled 2013-07-15: qty 1

## 2013-07-15 MED ORDER — ACETAMINOPHEN 325 MG PO TABS: 650.0000 mg | ORAL_TABLET | Freq: Four times a day (QID) | ORAL | Status: DC | PRN

## 2013-07-15 MED ORDER — ACETAMINOPHEN 650 MG RE SUPP: 650.0000 mg | Freq: Four times a day (QID) | RECTAL | Status: DC | PRN

## 2013-07-15 MED ORDER — ENOXAPARIN SODIUM 40 MG/0.4ML ~~LOC~~ SOLN
40.0000 mg | SUBCUTANEOUS | Status: DC
  Filled 2013-07-15: qty 0.4

## 2013-07-15 NOTE — ED Notes (Signed)
Patient transported to X-ray 

## 2013-07-15 NOTE — Consult Note (Signed)
NEURO HOSPITALIST CONSULT NOTE    Reason for Consult: Transient episodes of blurred vision followed by HA  HPI:                                                                                                                                          [name] is an 37 y.o. female who has HTN and Lupus. He has no history of familial hisotry of migraine HA.  She was at work today when she had a sudden onset of bilateral visual field blurred vision and "possibly a brief peroid of vision loss". This was followed b a sever HA in bilateral frontal region.  Lasted for about 30 minutes then subsided.  Again later today she had a episode of bilateral L>R blurred vision which again was associated with sever HA.  ON admission her BP was 220/121 and remained high for 2 hours prior to be treated. Patient did not take BP at home and is unsure what her BP was at home. Currently she is having a HA but her blurred vision has resolved--other than some dark spots. Neurology was called for advise.   Past Medical History  Diagnosis Date  . Anemia   . Enlarged heart     managed by cardiology  . Hypertension   . Lupus 1999  . Allergy     History reviewed. No pertinent past surgical history.  Family History  Problem Relation Age of Onset  . Diabetes Mother   . Diabetes Maternal Uncle   . Hypertension Maternal Grandmother     Social History:  reports that she has never smoked. She has never used smokeless tobacco. She reports that she drinks alcohol. She reports that she does not use illicit drugs.  Allergies  Allergen Reactions  . Septra [Bactrim] Hives    MEDICATIONS:                                                                                                                     Current Facility-Administered Medications  Medication Dose Route Frequency Provider Last Rate Last Dose  . labetalol (NORMODYNE,TRANDATE) injection 5 mg  5 mg Intravenous Q10 min PRN Arthor Captain, PA-C   5 mg at 07/15/13 1511  . morphine 4 MG/ML injection 4 mg  4 mg Intravenous Once Arthor Captain, PA-C       Current Outpatient Prescriptions  Medication Sig Dispense Refill  . amLODipine (NORVASC) 10 MG tablet Take 1 tablet (10 mg total) by mouth daily.  30 tablet  0  . cloNIDine (CATAPRES) 0.1 MG tablet Take 0.1 mg by mouth 2 (two) times daily.      . hydroxychloroquine (PLAQUENIL) 200 MG tablet Take 200 mg by mouth 2 (two) times daily.      Marland Kitchen albuterol (PROVENTIL HFA;VENTOLIN HFA) 108 (90 BASE) MCG/ACT inhaler Inhale 1-2 puffs into the lungs every 6 (six) hours as needed for wheezing.  1 Inhaler  0  . traMADol (ULTRAM) 50 MG tablet Take 2 tablets (100 mg total) by mouth every 8 (eight) hours as needed for pain.  30 tablet  0  . [DISCONTINUED] diphenhydrAMINE (BENADRYL) 25 mg capsule Take 25 mg by mouth every 6 (six) hours as needed for allergies.      . [DISCONTINUED] fexofenadine (ALLEGRA) 180 MG tablet Take 180 mg by mouth daily.          ROS:                                                                                                                                       History obtained from the patient  General ROS: negative for - chills, fatigue, fever, night sweats, weight gain or weight loss Psychological ROS: negative for - behavioral disorder, hallucinations, memory difficulties, mood swings or suicidal ideation Ophthalmic ROS: negative for - blurry vision, double vision, eye pain or loss of vision ENT ROS: negative for - epistaxis, nasal discharge, oral lesions, sore throat, tinnitus or vertigo Allergy and Immunology ROS: negative for - hives or itchy/watery eyes Hematological and Lymphatic ROS: negative for - bleeding problems, bruising or swollen lymph nodes Endocrine ROS: negative for - galactorrhea, hair pattern changes, polydipsia/polyuria or temperature intolerance Respiratory ROS: negative for - cough, hemoptysis, shortness of breath or  wheezing Cardiovascular ROS: negative for - chest pain, dyspnea on exertion, edema or irregular heartbeat Gastrointestinal ROS: negative for - abdominal pain, diarrhea, hematemesis, nausea/vomiting or stool incontinence Genito-Urinary ROS: negative for - dysuria, hematuria, incontinence or urinary frequency/urgency Musculoskeletal ROS: negative for - joint swelling or muscular weakness Neurological ROS: as noted in HPI Dermatological ROS: negative for rash and skin lesion changes   Blood pressure 123/63, pulse 75, temperature 98.7 F (37.1 C), temperature source Oral, resp. rate 19, weight 105.461 kg (232 lb 8 oz), last menstrual period 05/25/2013, SpO2 100.00%.   Neurologic Examination:  Mental Status: Alert, oriented, thought content appropriate.  Speech fluent without evidence of aphasia.  Able to follow 3 step commands without difficulty. Cranial Nerves: II: Discs flat bilaterally; Visual fields grossly normal, pupils equal, round, reactive to light and accommodation III,IV, VI: ptosis not present, extra-ocular motions intact bilaterally V,VII: smile symmetric, facial light touch sensation normal bilaterally VIII: hearing normal bilaterally IX,X: gag reflex present XI: bilateral shoulder shrug XII: midline tongue extension without atrophy or fasciculations  Motor: Right : Upper extremity   5/5    Left:     Upper extremity   5/5  Lower extremity   5/5     Lower extremity   5/5 Tone and bulk:normal tone throughout; no atrophy noted Sensory: Pinprick and light touch intact throughout, bilaterally Deep Tendon Reflexes:  1+ throughout Plantars: Right: downgoing   Left: downgoing Cerebellar: normal finger-to-nose,  normal heel-to-shin test Gait: not assessed due to multiple leads CV: pulses palpable throughout    Lab Results  Component Value Date/Time   CHOL  Value: 179         ATP III CLASSIFICATION:  <200     mg/dL   Desirable  914-782  mg/dL   Borderline High  >=956    mg/dL   High        09/13/3084  5:46 PM    Results for orders placed during the hospital encounter of 07/15/13 (from the past 48 hour(s))  CBC WITH DIFFERENTIAL     Status: Abnormal   Collection Time    07/15/13 12:58 PM      Result Value Range   WBC 4.2  4.0 - 10.5 K/uL   RBC 4.61  3.87 - 5.11 MIL/uL   Hemoglobin 11.5 (*) 12.0 - 15.0 g/dL   HCT 57.8 (*) 46.9 - 62.9 %   MCV 73.1 (*) 78.0 - 100.0 fL   MCH 24.9 (*) 26.0 - 34.0 pg   MCHC 34.1  30.0 - 36.0 g/dL   RDW 52.8  41.3 - 24.4 %   Platelets 268  150 - 400 K/uL   Neutrophils Relative % 57  43 - 77 %   Neutro Abs 2.4  1.7 - 7.7 K/uL   Lymphocytes Relative 33  12 - 46 %   Lymphs Abs 1.4  0.7 - 4.0 K/uL   Monocytes Relative 9  3 - 12 %   Monocytes Absolute 0.4  0.1 - 1.0 K/uL   Eosinophils Relative 1  0 - 5 %   Eosinophils Absolute 0.0  0.0 - 0.7 K/uL   Basophils Relative 0  0 - 1 %   Basophils Absolute 0.0  0.0 - 0.1 K/uL  COMPREHENSIVE METABOLIC PANEL     Status: Abnormal   Collection Time    07/15/13 12:58 PM      Result Value Range   Sodium 138  135 - 145 mEq/L   Potassium 3.5  3.5 - 5.1 mEq/L   Chloride 103  96 - 112 mEq/L   CO2 24  19 - 32 mEq/L   Glucose, Bld 142 (*) 70 - 99 mg/dL   BUN 9  6 - 23 mg/dL   Creatinine, Ser 0.10  0.50 - 1.10 mg/dL   Calcium 9.1  8.4 - 27.2 mg/dL   Total Protein 8.0  6.0 - 8.3 g/dL   Albumin 3.4 (*) 3.5 - 5.2 g/dL   AST 24  0 - 37 U/L   ALT 34  0 - 35 U/L   Alkaline Phosphatase 62  39 - 117 U/L   Total Bilirubin 0.4  0.3 - 1.2 mg/dL   GFR calc non Af Amer >90  >90 mL/min   GFR calc Af Amer >90  >90 mL/min   Comment: (NOTE)     The eGFR has been calculated using the CKD EPI equation.     This calculation has not been validated in all clinical situations.     eGFR's persistently <90 mL/min signify possible Chronic Kidney     Disease.  POCT I-STAT TROPONIN I     Status: None   Collection  Time    07/15/13  1:42 PM      Result Value Range   Troponin i, poc 0.01  0.00 - 0.08 ng/mL   Comment 3            Comment: Due to the release kinetics of cTnI,     a negative result within the first hours     of the onset of symptoms does not rule out     myocardial infarction with certainty.     If myocardial infarction is still suspected,     repeat the test at appropriate intervals.  URINE RAPID DRUG SCREEN (HOSP PERFORMED)     Status: Abnormal   Collection Time    07/15/13  3:10 PM      Result Value Range   Opiates NONE DETECTED  NONE DETECTED   Cocaine NONE DETECTED  NONE DETECTED   Benzodiazepines NONE DETECTED  NONE DETECTED   Amphetamines NONE DETECTED  NONE DETECTED   Tetrahydrocannabinol POSITIVE (*) NONE DETECTED   Barbiturates NONE DETECTED  NONE DETECTED   Comment:            DRUG SCREEN FOR MEDICAL PURPOSES     ONLY.  IF CONFIRMATION IS NEEDED     FOR ANY PURPOSE, NOTIFY LAB     WITHIN 5 DAYS.                LOWEST DETECTABLE LIMITS     FOR URINE DRUG SCREEN     Drug Class       Cutoff (ng/mL)     Amphetamine      1000     Barbiturate      200     Benzodiazepine   200     Tricyclics       300     Opiates          300     Cocaine          300     THC              50  URINALYSIS, ROUTINE W REFLEX MICROSCOPIC     Status: Abnormal   Collection Time    07/15/13  3:10 PM      Result Value Range   Color, Urine YELLOW  YELLOW   APPearance CLOUDY (*) CLEAR   Specific Gravity, Urine 1.015  1.005 - 1.030   pH 6.0  5.0 - 8.0   Glucose, UA NEGATIVE  NEGATIVE mg/dL   Hgb urine dipstick NEGATIVE  NEGATIVE   Bilirubin Urine NEGATIVE  NEGATIVE   Ketones, ur NEGATIVE  NEGATIVE mg/dL   Protein, ur NEGATIVE  NEGATIVE mg/dL   Urobilinogen, UA 1.0  0.0 - 1.0 mg/dL   Nitrite NEGATIVE  NEGATIVE   Leukocytes, UA MODERATE (*) NEGATIVE  URINE MICROSCOPIC-ADD ON     Status: Abnormal   Collection Time    07/15/13  3:10  PM      Result Value Range   Squamous Epithelial / LPF  MANY (*) RARE   WBC, UA 3-6  <3 WBC/hpf   Bacteria, UA FEW (*) RARE    Dg Chest 2 View  07/15/2013   CLINICAL DATA:  Anemia and hypertension ; shortness of breath  EXAM: CHEST  2 VIEW  COMPARISON:  May 02, 2013  FINDINGS: There is no edema or consolidation. Heart is mildly enlarged with normal pulmonary vascularity. No adenopathy. No bone lesions.  IMPRESSION: Cardiac enlargement again noted.  No edema or consolidation.   Electronically Signed   By: Bretta Bang M.D.   On: 07/15/2013 13:50   Ct Head Wo Contrast  07/15/2013   CLINICAL DATA:  Headache, blurred vision.  EXAM: CT HEAD WITHOUT CONTRAST  TECHNIQUE: Contiguous axial images were obtained from the base of the skull through the vertex without intravenous contrast.  COMPARISON:  March 31, 2012.  FINDINGS: Bony calvarium appears intact. No mass effect or midline shift is noted. Ventricular size is within normal limits. There is no evidence of mass lesion, hemorrhage or acute infarction.  IMPRESSION: No gross intracranial abnormality seen.   Electronically Signed   By: Roque Lias M.D.   On: 07/15/2013 14:10    Assessment and plan per attending neurologist  Felicie Morn PA-C [facility] [phone]  07/15/2013, 4:49 PM   Assessment/Plan: 37 YO female with new onset of transient blurred vision followed by HA in the setting of hypertensive urgency.  Given visual symptoms followed by HA complicated migraine is possibility. Alternative possibility include PRES   Recommend:  1) MRI brain to evaluate for stroke versus PRES 2) BP control 3) Symptomatic HA control.      Patient seen and examined together with physician assistant and I concur with the assessment and plan.  Wyatt Portela, MD

## 2013-07-15 NOTE — ED Notes (Signed)
Pt reports being hypertensive at her PCP last week.  He doubled her clonidine.

## 2013-07-15 NOTE — ED Provider Notes (Signed)
Physical Exam  BP 183/98  Pulse 89  Temp(Src) 98.7 F (37.1 C) (Oral)  Resp 19  Wt 232 lb 8 oz (105.461 kg)  SpO2 99%  LMP 05/25/2013  Physical Exam  Nursing note and vitals reviewed. Constitutional: She is oriented to person, place, and time. She appears well-developed and well-nourished. No distress.  HENT:  Head: Normocephalic and atraumatic.  Right Ear: External ear normal.  Left Ear: External ear normal.  Nose: Nose normal.  Mouth/Throat: Oropharynx is clear and moist.  Eyes: Conjunctivae are normal.  Neck: Normal range of motion.  Cardiovascular: Normal rate, regular rhythm and normal heart sounds.   Pulmonary/Chest: Effort normal and breath sounds normal. No stridor. No respiratory distress. She has no wheezes. She has no rales.  Abdominal: Soft. She exhibits no distension.  Musculoskeletal: Normal range of motion.  Neurological: She is alert and oriented to person, place, and time. She has normal strength.  Skin: Skin is warm and dry. She is not diaphoretic. No erythema.  Psychiatric: She has a normal mood and affect. Her behavior is normal.    ED Course  Procedures  Results for orders placed during the hospital encounter of 07/15/13  CBC WITH DIFFERENTIAL      Result Value Range   WBC 4.2  4.0 - 10.5 K/uL   RBC 4.61  3.87 - 5.11 MIL/uL   Hemoglobin 11.5 (*) 12.0 - 15.0 g/dL   HCT 16.1 (*) 09.6 - 04.5 %   MCV 73.1 (*) 78.0 - 100.0 fL   MCH 24.9 (*) 26.0 - 34.0 pg   MCHC 34.1  30.0 - 36.0 g/dL   RDW 40.9  81.1 - 91.4 %   Platelets 268  150 - 400 K/uL   Neutrophils Relative % 57  43 - 77 %   Neutro Abs 2.4  1.7 - 7.7 K/uL   Lymphocytes Relative 33  12 - 46 %   Lymphs Abs 1.4  0.7 - 4.0 K/uL   Monocytes Relative 9  3 - 12 %   Monocytes Absolute 0.4  0.1 - 1.0 K/uL   Eosinophils Relative 1  0 - 5 %   Eosinophils Absolute 0.0  0.0 - 0.7 K/uL   Basophils Relative 0  0 - 1 %   Basophils Absolute 0.0  0.0 - 0.1 K/uL  COMPREHENSIVE METABOLIC PANEL      Result  Value Range   Sodium 138  135 - 145 mEq/L   Potassium 3.5  3.5 - 5.1 mEq/L   Chloride 103  96 - 112 mEq/L   CO2 24  19 - 32 mEq/L   Glucose, Bld 142 (*) 70 - 99 mg/dL   BUN 9  6 - 23 mg/dL   Creatinine, Ser 7.82  0.50 - 1.10 mg/dL   Calcium 9.1  8.4 - 95.6 mg/dL   Total Protein 8.0  6.0 - 8.3 g/dL   Albumin 3.4 (*) 3.5 - 5.2 g/dL   AST 24  0 - 37 U/L   ALT 34  0 - 35 U/L   Alkaline Phosphatase 62  39 - 117 U/L   Total Bilirubin 0.4  0.3 - 1.2 mg/dL   GFR calc non Af Amer >90  >90 mL/min   GFR calc Af Amer >90  >90 mL/min  URINE RAPID DRUG SCREEN (HOSP PERFORMED)      Result Value Range   Opiates NONE DETECTED  NONE DETECTED   Cocaine NONE DETECTED  NONE DETECTED   Benzodiazepines NONE DETECTED  NONE  DETECTED   Amphetamines NONE DETECTED  NONE DETECTED   Tetrahydrocannabinol POSITIVE (*) NONE DETECTED   Barbiturates NONE DETECTED  NONE DETECTED  URINALYSIS, ROUTINE W REFLEX MICROSCOPIC      Result Value Range   Color, Urine YELLOW  YELLOW   APPearance CLOUDY (*) CLEAR   Specific Gravity, Urine 1.015  1.005 - 1.030   pH 6.0  5.0 - 8.0   Glucose, UA NEGATIVE  NEGATIVE mg/dL   Hgb urine dipstick NEGATIVE  NEGATIVE   Bilirubin Urine NEGATIVE  NEGATIVE   Ketones, ur NEGATIVE  NEGATIVE mg/dL   Protein, ur NEGATIVE  NEGATIVE mg/dL   Urobilinogen, UA 1.0  0.0 - 1.0 mg/dL   Nitrite NEGATIVE  NEGATIVE   Leukocytes, UA MODERATE (*) NEGATIVE  URINE MICROSCOPIC-ADD ON      Result Value Range   Squamous Epithelial / LPF MANY (*) RARE   WBC, UA 3-6  <3 WBC/hpf   Bacteria, UA FEW (*) RARE  POCT I-STAT TROPONIN I      Result Value Range   Troponin i, poc 0.01  0.00 - 0.08 ng/mL   Comment 3              Dg Chest 2 View  07/15/2013   CLINICAL DATA:  Anemia and hypertension ; shortness of breath  EXAM: CHEST  2 VIEW  COMPARISON:  May 02, 2013  FINDINGS: There is no edema or consolidation. Heart is mildly enlarged with normal pulmonary vascularity. No adenopathy. No bone lesions.   IMPRESSION: Cardiac enlargement again noted.  No edema or consolidation.   Electronically Signed   By: Bretta Bang M.D.   On: 07/15/2013 13:50   Ct Head Wo Contrast  07/15/2013   CLINICAL DATA:  Headache, blurred vision.  EXAM: CT HEAD WITHOUT CONTRAST  TECHNIQUE: Contiguous axial images were obtained from the base of the skull through the vertex without intravenous contrast.  COMPARISON:  March 31, 2012.  FINDINGS: Bony calvarium appears intact. No mass effect or midline shift is noted. Ventricular size is within normal limits. There is no evidence of mass lesion, hemorrhage or acute infarction.  IMPRESSION: No gross intracranial abnormality seen.   Electronically Signed   By: Roque Lias M.D.   On: 07/15/2013 14:10   Mr Laqueta Jean WU Contrast  07/15/2013   CLINICAL DATA:  Hypertension and lupus. Sudden onset of blurred vision and headache.  EXAM: MRI HEAD WITHOUT AND WITH CONTRAST  TECHNIQUE: Multiplanar, multiecho pulse sequences of the brain and surrounding structures were obtained without and with intravenous contrast.  CONTRAST:  20mL MULTIHANCE GADOBENATE DIMEGLUMINE 529 MG/ML IV SOLN  COMPARISON:  CT head from the same day.  FINDINGS: Marrow signal is somewhat depressed. The calvarium appears slightly expanded. These findings are compatible with anemia. There is no osseous enhancement.  No acute infarct, hemorrhage, or mass lesion is present. No significant white matter disease is evident. Dilated perivascular spaces in the basal ganglia are within normal limits.  Flow is present in the major intracranial arteries. The globes and orbits are intact. The sella is mildly expanded without evidence for a a pituitary lesion. Mild mucosal thickening is present in the left sphenoid sinus. The paranasal sinuses and the mastoid air cells are otherwise clear.  Postcontrast images demonstrate no pathologic enhancement.  IMPRESSION: 1. Normal MRI appearance of the brain. 2. Mildly expanded sella. This could be  related to intracranial hypertension. 3. Minimal left sphenoid sinus disease. 4. Decreased T1 marrow signal compatible with patient's  known anemia.   Electronically Signed   By: Gennette Pac M.D.   On: 07/15/2013 18:53     MDM Patient signed out to me by Tiburcio Pea, PA-C. Pt presented with HA and visual changes. HTN urgency. MRI pending. Concern for PRES given Lupus hx.   MR brain shows mildly expanded sella. Discussed with neurology who believes this is a nonconcerning finding given no other abnormality. BP again above 200 after 5 mg of labetalol. Gave additional 5mg . Will admit for HTN urgency. Admission appreciated. Vital signs stable. Discussed case with Dr. Rubin Payor who agrees with plan.    Medications  labetalol (NORMODYNE,TRANDATE) injection 5 mg (5 mg Intravenous Given 07/15/13 1834)  morphine 4 MG/ML injection 4 mg (4 mg Intravenous Given 07/15/13 1659)  gadobenate dimeglumine (MULTIHANCE) injection 20 mL (20 mLs Intravenous Contrast Given 07/15/13 1815)         Mora Bellman, PA-C 07/15/13 1955

## 2013-07-15 NOTE — Progress Notes (Signed)
Patient admitted to 5w07 from ED. Patient is A&Ox3. Patient's skin is warm, dry and intact. Patient lives at home with husband and children. Patient placed on telemetry running NSR. Patient oriented to unit and room. Told to call for assistance before getting up, patient stated understanding. Will continue to monitor patient. Nelda Marseille, RN

## 2013-07-15 NOTE — H&P (Addendum)
[facility] History and Physical  [name] JXB:[id number] DOB: 04-29-1976 DOA: 07/15/2013  Referring physician: ER physician. PCP: Billee Cashing, MD  Chief Complaint: Blurred vision and headache.  HPI: [name] is a 37 y.o. female with history of lupus nephritis and hypertension started experiencing headache and blurred vision. Patient had transient visual loss in the right eye for around 3-4 minutes. The eye sight came back but remained blurry. Patient's headache was in the frontal aspect of the head. Patient's blood pressure was found to be high. On-call neurologist was consulted patient had MRI brain done which did not show anything acute. I have discussed with Dr. Thad Ranger about the MRI findings. Dr. Thad Ranger feels patient's MRI findings are not suggestive of intracranial hypotension. Patient presently feels that her headache has resolved blurred vision has improved after patient was given labetalol IV 2 doses. Patient has been admitted for further management and observation. Patient denies any chest pain or shortness of breath. Denies any nausea vomiting abdominal pain fever chills.   Review of Systems: As presented in the history of presenting illness, rest negative.  Past Medical History  Diagnosis Date  . Anemia   . Enlarged heart     managed by cardiology  . Hypertension   . Lupus 1999  . Allergy    History reviewed. No pertinent past surgical history. Social History:  reports that she has never smoked. She has never used smokeless tobacco. She reports that she drinks alcohol. She reports that she does not use illicit drugs. Where does patient live home. Can patient participate in ADLs? Yes.  Allergies  Allergen Reactions  . Septra [Bactrim] Hives    Family History:  Family History  Problem Relation Age of Onset  . Diabetes Mother   . Diabetes Maternal Uncle   . Hypertension Maternal Grandmother       Prior to Admission medications    Medication Sig Start Date End Date Taking? Authorizing Provider  amLODipine (NORVASC) 10 MG tablet Take 1 tablet (10 mg total) by mouth daily. 03/31/12  Yes Trixie Dredge, PA-C  cloNIDine (CATAPRES) 0.1 MG tablet Take 0.1 mg by mouth 2 (two) times daily.   Yes Historical Provider, MD  hydroxychloroquine (PLAQUENIL) 200 MG tablet Take 200 mg by mouth 2 (two) times daily.   Yes Historical Provider, MD  albuterol (PROVENTIL HFA;VENTOLIN HFA) 108 (90 BASE) MCG/ACT inhaler Inhale 1-2 puffs into the lungs every 6 (six) hours as needed for wheezing. 05/20/13   Reuben Likes, MD  traMADol (ULTRAM) 50 MG tablet Take 2 tablets (100 mg total) by mouth every 8 (eight) hours as needed for pain. 05/20/13   Reuben Likes, MD    Physical Exam: Filed Vitals:   07/15/13 2100 07/15/13 2130 07/15/13 2145 07/15/13 2236  BP: 164/93 179/94 152/74 186/94  Pulse:    99  Temp:    98.2 F (36.8 C)  TempSrc:    Oral  Resp:    20  Weight:      SpO2:    99%     General:  Well-developed and nourished.  Eyes: Anicteric no pallor.  ENT: No discharge from ears eyes nose mouth.  Neck: No mass felt.  Cardiovascular: S1-S2 heard.  Respiratory: No rhonchi or crepitations.  Abdomen: Soft nontender bowel sounds present.  Skin: No rash.  Musculoskeletal: No edema.  Psychiatric: Appears normal.  Neurologic: Alert awake oriented to time place and person. Moves all extremities.  Labs on Admission:  Basic Metabolic Panel:  Recent Labs Lab 07/15/13 1258  NA 138  K 3.5  CL 103  CO2 24  GLUCOSE 142*  BUN 9  CREATININE 0.69  CALCIUM 9.1   Liver Function Tests:  Recent Labs Lab 07/15/13 1258  AST 24  ALT 34  ALKPHOS 62  BILITOT 0.4  PROT 8.0  ALBUMIN 3.4*   No results found for this basename: LIPASE, AMYLASE,  in the last 168 hours No results found for this basename: AMMONIA,  in the last 168 hours CBC:  Recent Labs Lab 07/15/13 1258  WBC 4.2  NEUTROABS 2.4  HGB 11.5*  HCT 33.7*  MCV  73.1*  PLT 268   Cardiac Enzymes: No results found for this basename: CKTOTAL, CKMB, CKMBINDEX, TROPONINI,  in the last 168 hours  BNP (last 3 results) No results found for this basename: PROBNP,  in the last 8760 hours CBG: No results found for this basename: GLUCAP,  in the last 168 hours  Radiological Exams on Admission: Dg Chest 2 View  07/15/2013   CLINICAL DATA:  Anemia and hypertension ; shortness of breath  EXAM: CHEST  2 VIEW  COMPARISON:  May 02, 2013  FINDINGS: There is no edema or consolidation. Heart is mildly enlarged with normal pulmonary vascularity. No adenopathy. No bone lesions.  IMPRESSION: Cardiac enlargement again noted.  No edema or consolidation.   Electronically Signed   By: Bretta Bang M.D.   On: 07/15/2013 13:50   Ct Head Wo Contrast  07/15/2013   CLINICAL DATA:  Headache, blurred vision.  EXAM: CT HEAD WITHOUT CONTRAST  TECHNIQUE: Contiguous axial images were obtained from the base of the skull through the vertex without intravenous contrast.  COMPARISON:  March 31, 2012.  FINDINGS: Bony calvarium appears intact. No mass effect or midline shift is noted. Ventricular size is within normal limits. There is no evidence of mass lesion, hemorrhage or acute infarction.  IMPRESSION: No gross intracranial abnormality seen.   Electronically Signed   By: Roque Lias M.D.   On: 07/15/2013 14:10   Mr Laqueta Jean VW Contrast  07/15/2013   CLINICAL DATA:  Hypertension and lupus. Sudden onset of blurred vision and headache.  EXAM: MRI HEAD WITHOUT AND WITH CONTRAST  TECHNIQUE: Multiplanar, multiecho pulse sequences of the brain and surrounding structures were obtained without and with intravenous contrast.  CONTRAST:  20mL MULTIHANCE GADOBENATE DIMEGLUMINE 529 MG/ML IV SOLN  COMPARISON:  CT head from the same day.  FINDINGS: Marrow signal is somewhat depressed. The calvarium appears slightly expanded. These findings are compatible with anemia. There is no osseous  enhancement.  No acute infarct, hemorrhage, or mass lesion is present. No significant white matter disease is evident. Dilated perivascular spaces in the basal ganglia are within normal limits.  Flow is present in the major intracranial arteries. The globes and orbits are intact. The sella is mildly expanded without evidence for a a pituitary lesion. Mild mucosal thickening is present in the left sphenoid sinus. The paranasal sinuses and the mastoid air cells are otherwise clear.  Postcontrast images demonstrate no pathologic enhancement.  IMPRESSION: 1. Normal MRI appearance of the brain. 2. Mildly expanded sella. This could be related to intracranial hypertension. 3. Minimal left sphenoid sinus disease. 4. Decreased T1 marrow signal compatible with patient's known anemia.   Electronically Signed   By: Gennette Pac M.D.   On: 07/15/2013 18:53    EKG: Independently reviewed. Normal sinus rhythm.  Assessment/Plan Principal Problem:   Hypertensive urgency Active Problems:  Lupus   Hypertensive emergency   1. Hypertensive urgency - patient blood pressure has improved with when necessary labetalol. At this time I have increased patient's clonidine dose from 0.1 mg by mouth twice a day to 3 times a day and continue Norvasc. Closely observe.Check plasma metanephrine's. 2. Possible UTI - patient has been placed on ceftriaxone. Follow urine cultures. 3. Chronic anemia - follow CBC. 4. History of lupus - continue plaquenil.    Code Status: Full code.  Family Communication: None.  Disposition Plan: Admit to inpatient.    KAKRAKANDY,ARSHAD N. [facility] Pager [phone].  If 7PM-7AM, please contact night-coverage [email or web address] Password [facility] 07/15/2013, 10:48 PM

## 2013-07-15 NOTE — ED Notes (Signed)
Pt reports blurred vision episode yesterday, then again today to left eye. States improved, but now has headache. Hx of lupus. Also hypertensive.

## 2013-07-16 DIAGNOSIS — N390 Urinary tract infection, site not specified: Secondary | ICD-10-CM

## 2013-07-16 LAB — URINE CULTURE: Colony Count: >=100000

## 2013-07-16 LAB — CBC
HCT: 34 % — ABNORMAL LOW (ref 36.0–46.0)
Hemoglobin: 11.3 g/dL — ABNORMAL LOW (ref 12.0–15.0)
MCH: 24.8 pg — ABNORMAL LOW (ref 26.0–34.0)
MCHC: 33.2 g/dL (ref 30.0–36.0)
MCV: 74.6 fL — ABNORMAL LOW (ref 78.0–100.0)
Platelets: 246 10*3/uL (ref 150–400)
RBC: 4.56 MIL/uL (ref 3.87–5.11)
RDW: 15.8 % — ABNORMAL HIGH (ref 11.5–15.5)
WBC: 3.9 10*3/uL — ABNORMAL LOW (ref 4.0–10.5)

## 2013-07-16 LAB — BASIC METABOLIC PANEL
BUN: 7 mg/dL (ref 6–23)
CO2: 21 mEq/L (ref 19–32)
Calcium: 8.9 mg/dL (ref 8.4–10.5)
Chloride: 102 mEq/L (ref 96–112)
Creatinine, Ser: 0.59 mg/dL (ref 0.50–1.10)
GFR calc Af Amer: >90 mL/min (ref >90)
GFR calc non Af Amer: >90 mL/min (ref >90)
Glucose, Bld: 119 mg/dL — ABNORMAL HIGH (ref 70–99)
Potassium: 3.7 mEq/L (ref 3.5–5.1)
Sodium: 139 mEq/L (ref 135–145)

## 2013-07-16 MED ORDER — CEFUROXIME AXETIL 500 MG PO TABS: 500.0000 mg | ORAL_TABLET | Freq: Two times a day (BID) | ORAL | Status: AC

## 2013-07-16 MED ORDER — CLONIDINE HCL 0.1 MG PO TABS: 0.1000 mg | ORAL_TABLET | Freq: Three times a day (TID) | ORAL | Status: DC

## 2013-07-16 MED ORDER — DEXTROSE 5 % IV SOLN
1.0000 g | Freq: Every day | INTRAVENOUS | Status: DC
  Administered 2013-07-16: 1 g via INTRAVENOUS
  Filled 2013-07-16: qty 10

## 2013-07-16 NOTE — Care Management Note (Signed)
    Page 1 of 1   07/16/2013     2:28:32 PM   CARE MANAGEMENT NOTE 07/16/2013  Patient:  [name], [name]   Account Number:  [id number]  Date Initiated:  07/16/2013  Documentation initiated by:  Letha Cape  Subjective/Objective Assessment:   dx hypertensive urgency  admit- lives with spouse.     Action/Plan:   Anticipated DC Date:  07/17/2013   Anticipated DC Plan:  HOME/SELF CARE      DC Planning Services  CM consult      Choice offered to / List presented to:             Status of service:  Completed, signed off Medicare Important Message given?   (If response is "NO", the following Medicare IM given date fields will be blank) Date Medicare IM given:   Date Additional Medicare IM given:    Discharge Disposition:  HOME/SELF CARE  Per UR Regulation:  Reviewed for med. necessity/level of care/duration of stay  If discussed at Long Length of Stay Meetings, dates discussed:    Comments:  07/16/13 11:07 Letha Cape RN BSN [phone] patient lives with spouse, Patient for dc today.

## 2013-07-16 NOTE — ED Provider Notes (Signed)
Medical screening examination/treatment/procedure(s) were performed by non-physician practitioner and as supervising physician I was immediately available for consultation/collaboration.  EKG Interpretation    Date/Time:  Wednesday July 15 2013 13:09:57 EST Ventricular Rate:  75 PR Interval:  164 QRS Duration: 106 QT Interval:  422 QTC Calculation: 471 R Axis:   104 Text Interpretation:  Sinus rhythm Borderline right axis deviation Sinus rhythm Rightward axis T wave abnormality Abnormal ekg Confirmed by Gerhard Munch  MD ([id number]) on 07/15/2013 1:18:56 PM             Juliet Rude. Rubin Payor, MD 07/16/13 [phone]

## 2013-07-16 NOTE — Progress Notes (Signed)
NEURO HOSPITALIST PROGRESS NOTE   SUBJECTIVE:                                                                                                                        No further HA or abnormal vision.  Feeling back to her baseline with exception of some nausea.   OBJECTIVE:                                                                                                                           Vital signs in last 24 hours: Temp:  [97.5 F (36.4 C)-98.7 F (37.1 C)] 97.5 F (36.4 C) (12/04 0622) Pulse Rate:  [71-99] 77 (12/04 0650) Resp:  [18-25] 20 (12/04 0622) BP: (115-220)/(56-121) 155/86 mmHg (12/04 0650) SpO2:  [99 %-100 %] 99 % (12/04 0622) Weight:  [102.1 kg (225 lb 1.4 oz)-105.461 kg (232 lb 8 oz)] 103.4 kg (227 lb 15.3 oz) (12/04 0624)  Intake/Output from previous day: 12/03 0701 - 12/04 0700 In: 290 [P.O.:240; IV Piggyback:50] Out: -  Intake/Output this shift:   Nutritional status: Cardiac  Past Medical History  Diagnosis Date  . Anemia   . Enlarged heart     managed by cardiology  . Hypertension   . Lupus 1999  . Allergy      Neurologic Exam:  Mental Status: Alert, oriented, thought content appropriate.  Speech fluent without evidence of aphasia.  Able to follow 3 step commands without difficulty. Cranial Nerves: II: Discs flat bilaterally; Visual fields grossly normal, pupils equal, round, reactive to light and accommodation III,IV, VI: ptosis not present, extra-ocular motions intact bilaterally V,VII: smile symmetric, facial light touch sensation normal bilaterally VIII: hearing normal bilaterally IX,X: gag reflex present XI: bilateral shoulder shrug XII: midline tongue extension without atrophy or fasciculations  Motor: Right : Upper extremity   5/5    Left:     Upper extremity   5/5  Lower extremity   5/5     Lower extremity   5/5 Tone and bulk:normal tone throughout; no atrophy noted Sensory: Pinprick and light  touch intact throughout, bilaterally Deep Tendon Reflexes:  Right: Upper Extremity   Left: Upper extremity   biceps (C-5 to C-6) 2/4   biceps (C-5 to C-6)  2/4 tricep (C7) 2/4    triceps (C7) 2/4 Brachioradialis (C6) 2/4  Brachioradialis (C6) 2/4  Lower Extremity Lower Extremity  quadriceps (L-2 to L-4) 2/4   quadriceps (L-2 to L-4) 2/4 Achilles (S1) 2/4   Achilles (S1) 2/4  Plantars: Right: downgoing   Left: downgoing Cerebellar: normal finger-to-nose,  normal heel-to-shin test CV: pulses palpable throughout    Lab Results: Lab Results  Component Value Date/Time   CHOL  Value: 179        ATP III CLASSIFICATION:  <200     mg/dL   Desirable  161-096  mg/dL   Borderline High  >=045    mg/dL   High        4/0/9811  5:46 PM   Lipid Panel No results found for this basename: CHOL, TRIG, HDL, CHOLHDL, VLDL, LDLCALC,  in the last 72 hours  Studies/Results: Dg Chest 2 View  07/15/2013   CLINICAL DATA:  Anemia and hypertension ; shortness of breath  EXAM: CHEST  2 VIEW  COMPARISON:  May 02, 2013  FINDINGS: There is no edema or consolidation. Heart is mildly enlarged with normal pulmonary vascularity. No adenopathy. No bone lesions.  IMPRESSION: Cardiac enlargement again noted.  No edema or consolidation.   Electronically Signed   By: Bretta Bang M.D.   On: 07/15/2013 13:50   Ct Head Wo Contrast  07/15/2013   CLINICAL DATA:  Headache, blurred vision.  EXAM: CT HEAD WITHOUT CONTRAST  TECHNIQUE: Contiguous axial images were obtained from the base of the skull through the vertex without intravenous contrast.  COMPARISON:  March 31, 2012.  FINDINGS: Bony calvarium appears intact. No mass effect or midline shift is noted. Ventricular size is within normal limits. There is no evidence of mass lesion, hemorrhage or acute infarction.  IMPRESSION: No gross intracranial abnormality seen.   Electronically Signed   By: Roque Lias M.D.   On: 07/15/2013 14:10   Mr Laqueta Jean BJ  Contrast  07/15/2013   CLINICAL DATA:  Hypertension and lupus. Sudden onset of blurred vision and headache.  EXAM: MRI HEAD WITHOUT AND WITH CONTRAST  TECHNIQUE: Multiplanar, multiecho pulse sequences of the brain and surrounding structures were obtained without and with intravenous contrast.  CONTRAST:  20mL MULTIHANCE GADOBENATE DIMEGLUMINE 529 MG/ML IV SOLN  COMPARISON:  CT head from the same day.  FINDINGS: Marrow signal is somewhat depressed. The calvarium appears slightly expanded. These findings are compatible with anemia. There is no osseous enhancement.  No acute infarct, hemorrhage, or mass lesion is present. No significant white matter disease is evident. Dilated perivascular spaces in the basal ganglia are within normal limits.  Flow is present in the major intracranial arteries. The globes and orbits are intact. The sella is mildly expanded without evidence for a a pituitary lesion. Mild mucosal thickening is present in the left sphenoid sinus. The paranasal sinuses and the mastoid air cells are otherwise clear.  Postcontrast images demonstrate no pathologic enhancement.  IMPRESSION: 1. Normal MRI appearance of the brain. 2. Mildly expanded sella. This could be related to intracranial hypertension. 3. Minimal left sphenoid sinus disease. 4. Decreased T1 marrow signal compatible with patient's known anemia.   Electronically Signed   By: Gennette Pac M.D.   On: 07/15/2013 18:53    MEDICATIONS  Scheduled: . amLODipine  10 mg Oral Daily  . cefTRIAXone (ROCEPHIN)  IV  1 g Intravenous Daily  . cloNIDine  0.1 mg Oral TID  . hydroxychloroquine  200 mg Oral BID  . sodium chloride  3 mL Intravenous Q12H  . sodium chloride  3 mL Intravenous Q12H    ASSESSMENT/PLAN:                                                                                                             Blurred  vision in setting of hypertesion and visual abnormalities.  MRI negative for PRES but did show a mild expanded sella. Exam shows no papilledema. Etiology unclear.  Differential includes complicated migraine,  Visual symptoms secondary to hypertensive urgency,  Versus (less likely) pseudotumour cerebri.   Recommend: 1) no further recommendations in hospital as symptoms have resolved 2) It has been explained to her that if HA returns with visual symptoms she needs to be evaluated by a ophthalmologist for a dilated ocular exam to look for papillary edema.   Neurology will S/O  Assessment and plan discussed with with attending physician and they are in agreement.    Felicie Morn PA-C [facility] [phone]  07/16/2013, 10:31 AM

## 2013-07-16 NOTE — Discharge Summary (Signed)
Physician Discharge Summary  [name] XBJ:[id number] DOB: 10-Jul-1976 DOA: 07/15/2013  PCP: Billee Cashing, MD  Admit date: 07/15/2013 Discharge date: 07/16/2013  Time spent: >30 minutes  Recommendations for Outpatient Follow-up:  1. BMET to follow electrolytes and renal function 2. Follow results of Metanephrine's 3. Please follow urine culture and adjust antibiotic therapy if needed 4. Reassess BP and adjust medications as needed  Discharge Diagnoses:  Hypertensive urgency Lupus Hx of asthma UTI Obesity   Discharge Condition: stable and improved. Will be discharge home with follow up appointment with PCP in 7-10 days.   Diet recommendation: heart healthy diet  Filed Weights   07/15/13 1210 07/15/13 2236 07/16/13 0624  Weight: 105.461 kg (232 lb 8 oz) 102.1 kg (225 lb 1.4 oz) 103.4 kg (227 lb 15.3 oz)    History of present illness:  37 y.o. female with history of lupus nephritis and hypertension started experiencing headache and blurred vision. Patient had transient visual loss in the right eye for around 3-4 minutes. The eye sight came back but remained blurry. Patient's headache was in the frontal aspect of the head. Patient's blood pressure was found to be high. On-call neurologist was consulted patient had MRI brain done which did not show anything acute. I have discussed with Dr. Thad Ranger about the MRI findings. Dr. Thad Ranger feels patient's MRI findings are not suggestive of intracranial hypotension. Patient presently feels that her headache has resolved blurred vision has improved after patient was given labetalol IV 2 doses. Patient has been admitted for further management and observation. Patient denies any chest pain or shortness of breath. Denies any nausea vomiting abdominal pain fever chills.    Hospital Course:  1-Hypertensive urgency: patient BP now stable and better control. -clonidine changed to 0.1mg  TID -continue norvasc 10mg  daily -advise to lose  weight and to follow a low sodium diet -will follow metanephrine's -follow up with PCP in 7-10 days for further medication adjustment  2-UTI non-complicated: >100,000 colonies on UA -final culture and sensitivity pending  -received IV rocephin as inpatient -will continue tx with ceftin for 4 more days to complete antibiotic therapy  3-Chronic anemia: Hgb stable (11.3). 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 Further follow up as per PCP.  4-Lupus: with hx of lupus nephritis.  -Cr WNL -continue plaquenil  5-Obesity: patient advise/instructed to lose weight.  6-Hx of asthma: no wheezing or SOB. Continue PRN albuterol  Procedures:  See x-ray reports below  Consultations:  Neurology  Discharge Exam: Filed Vitals:   07/16/13 1457  BP: 150/91  Pulse: 88  Temp: 98.4 F (36.9 C)  Resp: 18    General: NAD, afebrile, reports no HA's and no further vision disturbances Cardiovascular: S1 and S2, no rubs or gallops Respiratory: CTA bilaterally, no wheezing, no crackles Abdomen: soft, NT, ND, positive BS Extremities: no edema, no cyanosis, no erythema Neurology: AAOX3, no focal motor or sensory deficit; CN intact. Patient reports vision is back to normal and denies HA's  Discharge Instructions  Discharge Orders   Future Orders Complete By Expires   Diet - low sodium heart healthy  As directed    Discharge instructions  As directed    Comments:     Take medications as prescribed Follow a low sodium diet (Less than 2000 mg daily) Arrange follow up with PCP in 10 days       Medication List         albuterol 108 (90 BASE) MCG/ACT inhaler  Commonly known as:  PROVENTIL HFA;VENTOLIN HFA  Inhale 1-2 puffs into the lungs every 6 (six) hours as needed for wheezing.     amLODipine 10 MG tablet  Commonly known as:  NORVASC  Take 1 tablet (10 mg total) by mouth daily.     cefUROXime 500 MG tablet  Commonly known as:  CEFTIN  Take 1 tablet (500 mg total) by mouth 2  (two) times daily with a meal.     cloNIDine 0.1 MG tablet  Commonly known as:  CATAPRES  Take 1 tablet (0.1 mg total) by mouth 3 (three) times daily.     hydroxychloroquine 200 MG tablet  Commonly known as:  PLAQUENIL  Take 200 mg by mouth 2 (two) times daily.     traMADol 50 MG tablet  Commonly known as:  ULTRAM  Take 2 tablets (100 mg total) by mouth every 8 (eight) hours as needed for pain.       Allergies  Allergen Reactions  . Septra [Bactrim] Hives       Follow-up Information   Schedule an appointment as soon as possible for a visit with Billee Cashing, MD.   Specialty:  Family Medicine   Contact information:   [address] [place] [address] [phone]       The results of significant diagnostics from this hospitalization (including imaging, microbiology, ancillary and laboratory) are listed below for reference.    Significant Diagnostic Studies: Dg Chest 2 View  07/15/2013   CLINICAL DATA:  Anemia and hypertension ; shortness of breath  EXAM: CHEST  2 VIEW  COMPARISON:  May 02, 2013  FINDINGS: There is no edema or consolidation. Heart is mildly enlarged with normal pulmonary vascularity. No adenopathy. No bone lesions.  IMPRESSION: Cardiac enlargement again noted.  No edema or consolidation.   Electronically Signed   By: Bretta Bang M.D.   On: 07/15/2013 13:50   Ct Head Wo Contrast  07/15/2013   CLINICAL DATA:  Headache, blurred vision.  EXAM: CT HEAD WITHOUT CONTRAST  TECHNIQUE: Contiguous axial images were obtained from the base of the skull through the vertex without intravenous contrast.  COMPARISON:  March 31, 2012.  FINDINGS: Bony calvarium appears intact. No mass effect or midline shift is noted. Ventricular size is within normal limits. There is no evidence of mass lesion, hemorrhage or acute infarction.  IMPRESSION: No gross intracranial abnormality seen.   Electronically Signed   By: Roque Lias M.D.   On: 07/15/2013 14:10    Mr Laqueta Jean BJ Contrast  07/15/2013   CLINICAL DATA:  Hypertension and lupus. Sudden onset of blurred vision and headache.  EXAM: MRI HEAD WITHOUT AND WITH CONTRAST  TECHNIQUE: Multiplanar, multiecho pulse sequences of the brain and surrounding structures were obtained without and with intravenous contrast.  CONTRAST:  20mL MULTIHANCE GADOBENATE DIMEGLUMINE 529 MG/ML IV SOLN  COMPARISON:  CT head from the same day.  FINDINGS: Marrow signal is somewhat depressed. The calvarium appears slightly expanded. These findings are compatible with anemia. There is no osseous enhancement.  No acute infarct, hemorrhage, or mass lesion is present. No significant white matter disease is evident. Dilated perivascular spaces in the basal ganglia are within normal limits.  Flow is present in the major intracranial arteries. The globes and orbits are intact. The sella is mildly expanded without evidence for a a pituitary lesion. Mild mucosal thickening is present in the left sphenoid sinus. The paranasal sinuses and the mastoid air cells are otherwise clear.  Postcontrast images demonstrate  no pathologic enhancement.  IMPRESSION: 1. Normal MRI appearance of the brain. 2. Mildly expanded sella. This could be related to intracranial hypertension. 3. Minimal left sphenoid sinus disease. 4. Decreased T1 marrow signal compatible with patient's known anemia.   Electronically Signed   By: Gennette Pac M.D.   On: 07/15/2013 18:53    Microbiology: Recent Results (from the past 240 hour(s))  URINE CULTURE     Status: None   Collection Time    07/15/13  3:10 PM      Result Value Range Status   Specimen Description URINE, CLEAN CATCH   Final   Special Requests NONE   Final   Culture  Setup Time     Final   Value: 07/15/2013 18:42     Performed at [facility] Count     Final   Value: >=100,000 COLONIES/ML     Performed at [facility]   Culture     Final   Value: Multiple bacterial morphotypes  present, none predominant. Suggest appropriate recollection if clinically indicated.     Performed at [facility]   Report Status 07/16/2013 FINAL   Final     Labs: Basic Metabolic Panel:  Recent Labs Lab 07/15/13 1258 07/15/13 2310 07/16/13 0603  NA 138  --  139  K 3.5  --  3.7  CL 103  --  102  CO2 24  --  21  GLUCOSE 142*  --  119*  BUN 9  --  7  CREATININE 0.69 0.62 0.59  CALCIUM 9.1  --  8.9   Liver Function Tests:  Recent Labs Lab 07/15/13 1258  AST 24  ALT 34  ALKPHOS 62  BILITOT 0.4  PROT 8.0  ALBUMIN 3.4*   CBC:  Recent Labs Lab 07/15/13 1258 07/15/13 2310 07/16/13 0603  WBC 4.2 3.6* 3.9*  NEUTROABS 2.4  --   --   HGB 11.5* 11.3* 11.3*  HCT 33.7* 33.6* 34.0*  MCV 73.1* 73.8* 74.6*  PLT 268 264 246    Signed:  Orella Cushman  [facility] 07/16/2013, 3:09 PM

## 2013-07-17 ENCOUNTER — Encounter (HOSPITAL_COMMUNITY): Payer: Self-pay | Admitting: Physician Assistant

## 2013-07-17 NOTE — ED Provider Notes (Signed)
CSN: [id number]     Arrival date & time 07/15/13  1208 History   First MD Initiated Contact with Patient 07/15/13 1253     Chief Complaint  Patient presents with  . Blurred Vision  . Headache   (Consider location/radiation/quality/duration/timing/severity/associated sxs/prior Treatment) HPI This is a 37 year old female who presents the emergency department chief complaint of visual disturbance and headache.  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  She describes blurred vision bilaterally.  She states that she had in a CCA date severe headache without photophobia, phonophobia, nausea, vomiting.  It has lasted approximately 6 minutes then resolved.  This morning she had a recurrence of visual deficit however it was only in the left eye.  She describes left field deficit to the left side, with "glaze over her vision."  She now has bilateral floaters in her visual field.  She denies blurry vision at this time.  Patient has no history of visual deficits.  She does not wear corrective lenses.  She states that after her episode of visual change today she developed a severe headache.  She describes it as frontal, throbbing, no photophobia, phonophobia, stiff neck, rash is or fever.  The patient has a history of lupus and hypertension.  She is currently taking amlodipine and clonidine for hypertension.  She states she takes her medication daily as directed.  She sees Dr. Kathe Mariner.  He should states that her dose of clonidine was doubled last week. Patient denies chest pain, shortness of breath, peripheral edema, facial droop difficulty with speech or swallowing. Past Medical History  Diagnosis Date  . Anemia   . Enlarged heart     managed by cardiology  . Hypertension   . Lupus 1999  . Allergy    Past Surgical History  Procedure Laterality Date  . No past surgeries      Family History  Problem Relation Age of Onset  . Diabetes Mother   . Diabetes Maternal Uncle   . Hypertension Maternal Grandmother    History  Substance Use Topics  . Smoking status: Never Smoker   . Smokeless tobacco: Never Used  . Alcohol Use: Yes     Comment: rarely   OB History   Grav Para Term Preterm Abortions TAB SAB Ect Mult Living   5 2 0 2 3 1 2 0 0 2      Review of Systems  Constitutional: Negative for fever and chills.  HENT: Negative for trouble swallowing and voice change.   Eyes: Positive for visual disturbance. Negative for photophobia and pain.  Cardiovascular: Negative for chest pain, palpitations and leg swelling.  Gastrointestinal: Negative for vomiting.  Genitourinary: Negative for dysuria and hematuria.  Musculoskeletal: Negative for neck stiffness.  Skin: Negative for rash.  Neurological: Positive for headaches. Negative for dizziness, syncope, speech difficulty, weakness and numbness.  Psychiatric/Behavioral: Negative for confusion. The patient is not nervous/anxious.     Allergies  Septra  Home Medications   Current Outpatient Rx  Name  Route  Sig  Dispense  Refill  . amLODipine (NORVASC) 10 MG tablet   Oral   Take 1 tablet (10 mg total) by mouth daily.   30 tablet   0   . hydroxychloroquine (PLAQUENIL) 200 MG tablet   Oral   Take 200 mg by mouth 2 (two) times daily.         Marland Kitchen  albuterol (PROVENTIL HFA;VENTOLIN HFA) 108 (90 BASE) MCG/ACT inhaler   Inhalation   Inhale 1-2 puffs into the lungs every 6 (six) hours as needed for wheezing.   1 Inhaler   0   . cefUROXime (CEFTIN) 500 MG tablet   Oral   Take 1 tablet (500 mg total) by mouth 2 (two) times daily with a meal.   8 tablet   0   . cloNIDine (CATAPRES) 0.1 MG tablet   Oral   Take 1 tablet (0.1 mg total) by mouth 3 (three) times daily.   90 tablet   2   . traMADol (ULTRAM) 50 MG tablet   Oral   Take 2 tablets (100 mg total) by mouth every 8 (eight) hours as needed for  pain.   30 tablet   0    BP 150/91  Pulse 88  Temp(Src) 98.4 F (36.9 C) (Oral)  Resp 18  Ht 5' (1.524 m)  Wt 227 lb 15.3 oz (103.4 kg)  BMI 44.52 kg/m2  SpO2 99%  LMP 05/25/2013 Physical Exam  Vitals reviewed. Constitutional: She is oriented to person, place, and time. She appears well-developed and well-nourished. No distress.  HENT:  Head: Normocephalic and atraumatic.  Eyes: Conjunctivae are normal. No scleral icterus. PERRL, EOMI, Visual fields full to confrontation. Neck: Normal range of motion.  Cardiovascular: Normal rate, regular rhythm and normal heart sounds.  Exam reveals no gallop and no friction rub.   No murmur heard. No peripheral edema  Pulmonary/Chest: Effort normal and breath sounds normal. No respiratory distress.  Abdominal: Soft. Bowel sounds are normal. She exhibits no distension and no mass. There is no tenderness. There is no guarding.  Neurological: She is alert and oriented to person, place, and time.  Speech is clear and goal oriented, follows commands Finding mildly sluggish pupillary response on the Rside, Cranial nerves otherwise without deficit, no facial droop Normal strength in upper and lower extremities bilaterally including dorsiflexion and plantar flexion, strong and equal grip strength Sensation normal to light and sharp touch Moves extremities without ataxia, coordination intact Normal finger to nose and rapid alternating movements Neg romberg, no pronator drift Normal gait Normal heel-shin and balance   Skin: Skin is warm and dry. She is not diaphoretic.    ED Course  Procedures (including critical care time) Labs Review Labs Reviewed  CBC WITH DIFFERENTIAL - Abnormal; Notable for the following:    Hemoglobin 11.5 (*)    HCT 33.7 (*)    MCV 73.1 (*)    MCH 24.9 (*)    All other components within normal limits  COMPREHENSIVE METABOLIC PANEL - Abnormal; Notable for the following:    Glucose, Bld 142 (*)    Albumin 3.4 (*)     All other components within normal limits  URINE RAPID DRUG SCREEN (HOSP PERFORMED) - Abnormal; Notable for the following:    Tetrahydrocannabinol POSITIVE (*)    All other components within normal limits  URINALYSIS, ROUTINE W REFLEX MICROSCOPIC - Abnormal; Notable for the following:    APPearance CLOUDY (*)    Leukocytes, UA MODERATE (*)    All other components within normal limits  URINE MICROSCOPIC-ADD ON - Abnormal; Notable for the following:    Squamous Epithelial / LPF MANY (*)    Bacteria, UA FEW (*)    All other components within normal limits  BASIC METABOLIC PANEL - Abnormal; Notable for the following:    Glucose, Bld 119 (*)    All other components within normal  limits  CBC - Abnormal; Notable for the following:    WBC 3.9 (*)    Hemoglobin 11.3 (*)    HCT 34.0 (*)    MCV 74.6 (*)    MCH 24.8 (*)    RDW 15.8 (*)    All other components within normal limits  CBC - Abnormal; Notable for the following:    WBC 3.6 (*)    Hemoglobin 11.3 (*)    HCT 33.6 (*)    MCV 73.8 (*)    MCH 24.8 (*)    RDW 15.7 (*)    All other components within normal limits  URINE CULTURE  CREATININE, SERUM  METANEPHRINES, PLASMA  POCT I-STAT TROPONIN I   Imaging Review Dg Chest 2 View  07/15/2013   CLINICAL DATA:  Anemia and hypertension ; shortness of breath  EXAM: CHEST  2 VIEW  COMPARISON:  May 02, 2013  FINDINGS: There is no edema or consolidation. Heart is mildly enlarged with normal pulmonary vascularity. No adenopathy. No bone lesions.  IMPRESSION: Cardiac enlargement again noted.  No edema or consolidation.   Electronically Signed   By: Bretta Bang M.D.   On: 07/15/2013 13:50   Ct Head Wo Contrast  07/15/2013   CLINICAL DATA:  Headache, blurred vision.  EXAM: CT HEAD WITHOUT CONTRAST  TECHNIQUE: Contiguous axial images were obtained from the base of the skull through the vertex without intravenous contrast.  COMPARISON:  March 31, 2012.  FINDINGS: Bony calvarium appears  intact. No mass effect or midline shift is noted. Ventricular size is within normal limits. There is no evidence of mass lesion, hemorrhage or acute infarction.  IMPRESSION: No gross intracranial abnormality seen.   Electronically Signed   By: Roque Lias M.D.   On: 07/15/2013 14:10   Mr Laqueta Jean ZO Contrast  07/15/2013   CLINICAL DATA:  Hypertension and lupus. Sudden onset of blurred vision and headache.  EXAM: MRI HEAD WITHOUT AND WITH CONTRAST  TECHNIQUE: Multiplanar, multiecho pulse sequences of the brain and surrounding structures were obtained without and with intravenous contrast.  CONTRAST:  20mL MULTIHANCE GADOBENATE DIMEGLUMINE 529 MG/ML IV SOLN  COMPARISON:  CT head from the same day.  FINDINGS: Marrow signal is somewhat depressed. The calvarium appears slightly expanded. These findings are compatible with anemia. There is no osseous enhancement.  No acute infarct, hemorrhage, or mass lesion is present. No significant white matter disease is evident. Dilated perivascular spaces in the basal ganglia are within normal limits.  Flow is present in the major intracranial arteries. The globes and orbits are intact. The sella is mildly expanded without evidence for a a pituitary lesion. Mild mucosal thickening is present in the left sphenoid sinus. The paranasal sinuses and the mastoid air cells are otherwise clear.  Postcontrast images demonstrate no pathologic enhancement.  IMPRESSION: 1. Normal MRI appearance of the brain. 2. Mildly expanded sella. This could be related to intracranial hypertension. 3. Minimal left sphenoid sinus disease. 4. Decreased T1 marrow signal compatible with patient's known anemia.   Electronically Signed   By: Gennette Pac M.D.   On: 07/15/2013 18:53    EKG Interpretation    Date/Time:  Wednesday July 15 2013 13:09:57 EST Ventricular Rate:  75 PR Interval:  164 QRS Duration: 106 QT Interval:  422 QTC Calculation: 471 R Axis:   104 Text Interpretation:  Sinus  rhythm Borderline right axis deviation Sinus rhythm Rightward axis T wave abnormality Abnormal ekg Confirmed by Gerhard Munch  MD [phone]) on  07/15/2013 1:18:56 PM            MDM   1. Hypertensive emergency   2. Lupus   3. UTI (urinary tract infection)   4. Hypertensive urgency    Filed Vitals:   07/16/13 0622 07/16/13 0624 07/16/13 0650 07/16/13 1457  BP: 154/100  155/86 150/91  Pulse: 78  77 88  Temp: 97.5 F (36.4 C)   98.4 F (36.9 C)  TempSrc: Oral   Oral  Resp: 20   18  Height:      Weight:  227 lb 15.3 oz (103.4 kg)    SpO2: 99%   99%   4:25PM Patient with possible hypertensive urgency v. Stroke or migraine. patinent HTN treted well with labetalolol and bp 149/65 presently. Visual deficits are resolved but patient headache still present. CT negative. I consulted with Neurology regarding her diagnosis as she has RF for stroke, lupus encephalitis. They suggest MRI. BP is being monitored. I have given report to [facility] who will assume care of the patient.Arthor Captain, PA-C 07/17/13 [phone]

## 2013-07-18 NOTE — ED Provider Notes (Signed)
  Medical screening examination/treatment/procedure(s) were performed by non-physician practitioner and as supervising physician I was immediately available for consultation/collaboration.  I discussed the patient's case at length with our physician assistant, as well as our neurology team.   Gerhard Munch, MD 07/18/13 [phone]

## 2013-07-20 LAB — METANEPHRINES, PLASMA
Metanephrine, Free: 47 pg/mL (ref <=57)
Normetanephrine, Free: 64 pg/mL (ref <=148)
Total Metanephrines-Plasma: 111 pg/mL (ref <=205)

## 2013-08-27 ENCOUNTER — Ambulatory Visit (INDEPENDENT_AMBULATORY_CARE_PROVIDER_SITE_OTHER): Payer: No Typology Code available for payment source

## 2013-08-27 VITALS — BP 134/84 | HR 88 | Ht 60.0 in | Wt 233.0 lb

## 2013-08-27 DIAGNOSIS — Z3049 Encounter for surveillance of other contraceptives: Secondary | ICD-10-CM

## 2013-08-27 MED ORDER — MEDROXYPROGESTERONE ACETATE 104 MG/0.65ML ~~LOC~~ SUSP
104.0000 mg | Freq: Once | SUBCUTANEOUS | Status: AC
  Administered 2013-08-27: 104 mg via SUBCUTANEOUS

## 2013-09-03 ENCOUNTER — Ambulatory Visit: Payer: No Typology Code available for payment source

## 2013-09-17 ENCOUNTER — Encounter (HOSPITAL_COMMUNITY): Payer: Self-pay | Admitting: *Deleted

## 2013-09-17 ENCOUNTER — Inpatient Hospital Stay (HOSPITAL_COMMUNITY)
Admission: AD | Admit: 2013-09-17 | Discharge: 2013-09-17 | Disposition: A | Discharge status: Home / Self Care | Payer: BC Managed Care – PPO | Source: Ambulatory Visit | Attending: Obstetrics & Gynecology | Admitting: Obstetrics & Gynecology

## 2013-09-17 DIAGNOSIS — N949 Unspecified condition associated with female genital organs and menstrual cycle: Secondary | ICD-10-CM | POA: Insufficient documentation

## 2013-09-17 DIAGNOSIS — N939 Abnormal uterine and vaginal bleeding, unspecified: Secondary | ICD-10-CM

## 2013-09-17 DIAGNOSIS — N852 Hypertrophy of uterus: Secondary | ICD-10-CM | POA: Insufficient documentation

## 2013-09-17 DIAGNOSIS — M329 Systemic lupus erythematosus, unspecified: Secondary | ICD-10-CM | POA: Insufficient documentation

## 2013-09-17 DIAGNOSIS — R109 Unspecified abdominal pain: Secondary | ICD-10-CM | POA: Insufficient documentation

## 2013-09-17 DIAGNOSIS — I10 Essential (primary) hypertension: Secondary | ICD-10-CM | POA: Insufficient documentation

## 2013-09-17 DIAGNOSIS — N926 Irregular menstruation, unspecified: Secondary | ICD-10-CM

## 2013-09-17 DIAGNOSIS — N921 Excessive and frequent menstruation with irregular cycle: Secondary | ICD-10-CM

## 2013-09-17 DIAGNOSIS — N938 Other specified abnormal uterine and vaginal bleeding: Secondary | ICD-10-CM | POA: Insufficient documentation

## 2013-09-17 HISTORY — DX: Unspecified infectious disease: B99.9

## 2013-09-17 HISTORY — DX: Gestational diabetes mellitus in pregnancy, unspecified control: O24.419

## 2013-09-17 HISTORY — DX: Benign neoplasm of connective and other soft tissue, unspecified: D21.9

## 2013-09-17 HISTORY — DX: Cardiac murmur, unspecified: R01.1

## 2013-09-17 LAB — URINALYSIS, ROUTINE W REFLEX MICROSCOPIC
Bilirubin Urine: NEGATIVE
Glucose, UA: NEGATIVE mg/dL
Ketones, ur: NEGATIVE mg/dL
Leukocytes, UA: NEGATIVE
Nitrite: NEGATIVE
Protein, ur: NEGATIVE mg/dL
Specific Gravity, Urine: 1.015 (ref 1.005–1.030)
Urobilinogen, UA: 0.2 mg/dL (ref 0.0–1.0)
pH: 7 (ref 5.0–8.0)

## 2013-09-17 LAB — CBC
HCT: 31.7 % — ABNORMAL LOW (ref 36.0–46.0)
Hemoglobin: 10.7 g/dL — ABNORMAL LOW (ref 12.0–15.0)
MCH: 24.7 pg — ABNORMAL LOW (ref 26.0–34.0)
MCHC: 33.8 g/dL (ref 30.0–36.0)
MCV: 73 fL — ABNORMAL LOW (ref 78.0–100.0)
Platelets: 244 10*3/uL (ref 150–400)
RBC: 4.34 MIL/uL (ref 3.87–5.11)
RDW: 15.7 % — ABNORMAL HIGH (ref 11.5–15.5)
WBC: 3 10*3/uL — ABNORMAL LOW (ref 4.0–10.5)

## 2013-09-17 LAB — WET PREP, GENITAL
Clue Cells Wet Prep HPF POC: NONE SEEN
Trich, Wet Prep: NONE SEEN
Yeast Wet Prep HPF POC: NONE SEEN

## 2013-09-17 LAB — URINE MICROSCOPIC-ADD ON

## 2013-09-17 LAB — POCT PREGNANCY, URINE: Preg Test, Ur: NEGATIVE

## 2013-09-17 NOTE — MAU Note (Signed)
Started as mild, barely cramping. The past wk bleeding has gotten heavier and has been cramping, passing clots.

## 2013-09-17 NOTE — MAU Provider Note (Signed)
History     CSN: [id number]  Arrival date and time: 09/17/13 0938   First Provider Initiated Contact with Patient 09/17/13 1043      Chief Complaint  Patient presents with  . Vaginal Bleeding  . Abdominal Pain   HPI  [name] is a 38 y.o. female [phone] who presents today with heavy bleeding and cramping following her last Depo Provera shot. She received a Depo Provera shot on 05/25/13 after being off for 1 year. She had no bleeding or cramping following 05/25/13. Her window for a subsequent injection was 08/10/13-08/24/13. She began lightly bleeding on 08/27/13 and presented to clinic for her missed Depo Provera shot. Since administration, she has continued to bleed, with bleeding and cramping worsening since 09/10/13. She is using approximately 6 pads per day, with cramping pain across her lower abdomen rated as a 9/10. She has taken ibuprofen with minimal relief.  She reports passing a "large clot" at 7AM this morning, prompting her visit to the MAU.  She denies N/V/F, SOB, CP, and syncope, but reports feeling tired and weak.  She was scheduled to have a pelvic US for suspected uterine fibroids and enlarged uterus in October 2014, but did not show for the appointment.   OB History   Grav Para Term Preterm Abortions TAB SAB Ect Mult Living   5 2 0 2 3 1 2 0 0 2       Past Medical History  Diagnosis Date  . Anemia   . Enlarged heart     managed by cardiology  . Hypertension   . Lupus 1999  . Allergy   . Gestational diabetes   . Heart murmur   . Infection     UTI  . Depression     after losses  . Fibroid   . Vaginal Pap smear, abnormal     colpo, HPV    Past Surgical History  Procedure Laterality Date  . No past surgeries    . Therapeutic abortion      Family History  Problem Relation Age of Onset  . Diabetes Mother   . Diabetes Maternal Uncle   . Hypertension Maternal Grandmother   . Diabetes Father   . Cancer Maternal Grandfather     lung  . Hearing loss Neg  Hx     History  Substance Use Topics  . Smoking status: Never Smoker   . Smokeless tobacco: Never Used  . Alcohol Use: Yes     Comment: rarely    Allergies:  Allergies  Allergen Reactions  . Septra [Bactrim] Hives    No prescriptions prior to admission    Review of Systems  Constitutional: Positive for malaise/fatigue. Negative for fever and chills.  Eyes: Negative for blurred vision.  Respiratory: Negative for shortness of breath.   Cardiovascular: Negative for chest pain.  Gastrointestinal: Negative for nausea and vomiting.  Genitourinary: Negative for dysuria.  Neurological: Positive for dizziness. Negative for headaches.   Physical Exam   Blood pressure 150/88, pulse 83, temperature 98.3 F (36.8 C), temperature source Oral, resp. rate 18, height 5\' 1"  (1.549 m), weight 235 lb (106.595 kg), last menstrual period 08/31/2013.  Physical Exam  Constitutional: She is oriented to person, place, and time. She appears well-developed. No distress.  Obese  HENT:  Head: Normocephalic.  Cardiovascular: Normal rate.   Respiratory: Effort normal.  GI: Soft. She exhibits no distension and no mass. There is no tenderness. There is no rebound and no guarding.  Genitourinary: There is  no lesion on the right labia. There is no lesion on the left labia. Uterus is not tender. Enlarged: uterus slightly enlarged, although exam limited by body habitus. Cervix exhibits no motion tenderness. Right adnexum displays no tenderness. Left adnexum displays no tenderness. There is bleeding (moderate amount of bleeding on speculum exam) around the vagina.  Neurological: She is alert and oriented to person, place, and time.  Skin: Skin is warm and dry. No erythema.  Psychiatric: She has a normal mood and affect.   Results for orders placed during the hospital encounter of 09/17/13 (from the past 24 hour(s))  URINALYSIS, ROUTINE W REFLEX MICROSCOPIC     Status: Abnormal   Collection Time     09/17/13 10:10 AM      Result Value Range   Color, Urine YELLOW  YELLOW   APPearance CLOUDY (*) CLEAR   Specific Gravity, Urine 1.015  1.005 - 1.030   pH 7.0  5.0 - 8.0   Glucose, UA NEGATIVE  NEGATIVE mg/dL   Hgb urine dipstick LARGE (*) NEGATIVE   Bilirubin Urine NEGATIVE  NEGATIVE   Ketones, ur NEGATIVE  NEGATIVE mg/dL   Protein, ur NEGATIVE  NEGATIVE mg/dL   Urobilinogen, UA 0.2  0.0 - 1.0 mg/dL   Nitrite NEGATIVE  NEGATIVE   Leukocytes, UA NEGATIVE  NEGATIVE  URINE MICROSCOPIC-ADD ON     Status: Abnormal   Collection Time    09/17/13 10:10 AM      Result Value Range   Squamous Epithelial / LPF FEW (*) RARE   RBC / HPF TOO NUMEROUS TO COUNT  <3 RBC/hpf   Bacteria, UA RARE  RARE  POCT PREGNANCY, URINE     Status: None   Collection Time    09/17/13 10:29 AM      Result Value Range   Preg Test, Ur NEGATIVE  NEGATIVE  CBC     Status: Abnormal   Collection Time    09/17/13 11:05 AM      Result Value Range   WBC 3.0 (*) 4.0 - 10.5 K/uL   RBC 4.34  3.87 - 5.11 MIL/uL   Hemoglobin 10.7 (*) 12.0 - 15.0 g/dL   HCT 31.7 (*) 36.0 - 46.0 %   MCV 73.0 (*) 78.0 - 100.0 fL   MCH 24.7 (*) 26.0 - 34.0 pg   MCHC 33.8  30.0 - 36.0 g/dL   RDW 15.7 (*) 11.5 - 15.5 %   Platelets 244  150 - 400 K/uL  WET PREP, GENITAL     Status: Abnormal   Collection Time    09/17/13 11:16 AM      Result Value Range   Yeast Wet Prep HPF POC NONE SEEN  NONE SEEN   Trich, Wet Prep NONE SEEN  NONE SEEN   Clue Cells Wet Prep HPF POC NONE SEEN  NONE SEEN   WBC, Wet Prep HPF POC FEW (*) NONE SEEN     MAU Course  Procedures None  MDM Negative urine pregnancy test CBC Wet prep, GC/C swabs  Assessment and Plan  Assessment: 1. DUB secondary to depo provera on 08/27/13 2. Enlarged uterus, possible uterine fibroids  Plan Discharge home Outpatient [place] scheduled for 09/23/13 at 3:15pm for possible uterine fibroids and DUB Patient advised to call [facility] for F/U appointment and [place] results Bleeding  precautions discussed Patient advised to follow-up in MAU if her condition were to change or worsen   I have seen and evaluated the patient with the PA student.  I agree with the assessment and plan as written above.   Farris Has, PA-C 09/17/2013 1:45 PM

## 2013-09-17 NOTE — MAU Note (Signed)
Patient states she had her last Depo injection on 1-19 and has been bleeding since that time. States it is heavy at times. Has been having a lot of abdominal cramping for the past several days. Denies S/S of the flu, N/V/D.

## 2013-09-17 NOTE — Discharge Instructions (Signed)
Abnormal Uterine Bleeding Abnormal uterine bleeding can affect women at various stages in life, including teenagers, women in their reproductive years, pregnant women, and women who have reached menopause. Several kinds of uterine bleeding are considered abnormal, including:  Bleeding or spotting between periods.   Bleeding after sexual intercourse.   Bleeding that is heavier or more than normal.   Periods that last longer than usual.  Bleeding after menopause.  Many cases of abnormal uterine bleeding are minor and simple to treat, while others are more serious. Any type of abnormal bleeding should be evaluated by your health care provider. Treatment will depend on the cause of the bleeding. HOME CARE INSTRUCTIONS Monitor your condition for any changes. The following actions may help to alleviate any discomfort you are experiencing:  Avoid the use of tampons and douches as directed by your health care provider.  Change your pads frequently. You should get regular pelvic exams and Pap tests. Keep all follow-up appointments for diagnostic tests as directed by your health care provider.  SEEK MEDICAL CARE IF:   Your bleeding lasts more than 1 week.   You feel dizzy at times.  SEEK IMMEDIATE MEDICAL CARE IF:   You pass out.   You are changing pads every 15 to 30 minutes.   You have abdominal pain.  You have a fever.   You become sweaty or weak.   You are passing large blood clots from the vagina.   You start to feel nauseous and vomit. MAKE SURE YOU:   Understand these instructions.  Will watch your condition.  Will get help right away if you are not doing well or get worse. Document Released: 07/30/2005 Document Revised: 04/01/2013 Document Reviewed: 02/26/2013 [facility] Patient Information 2014 [place], [place]. Medroxyprogesterone injection [Contraceptive] What is this medicine? MEDROXYPROGESTERONE (me DROX ee proe JES te rone) contraceptive injections  prevent pregnancy. They provide effective birth control for 3 months. Depo-subQ Provera 104 is also used for treating pain related to endometriosis. This medicine may be used for other purposes; ask your health care provider or pharmacist if you have questions. COMMON BRAND NAME(S): Depo-Provera, Depo-subQ Provera 104 What should I tell my health care provider before I take this medicine? They need to know if you have any of these conditions: -frequently drink alcohol -asthma -blood vessel disease or a history of a blood clot in the lungs or legs -bone disease such as osteoporosis -breast cancer -diabetes -eating disorder (anorexia nervosa or bulimia) -high blood pressure -HIV infection or AIDS -kidney disease -liver disease -mental depression -migraine -seizures (convulsions) -stroke -tobacco smoker -vaginal bleeding -an unusual or allergic reaction to medroxyprogesterone, other hormones, medicines, foods, dyes, or preservatives -pregnant or trying to get pregnant -breast-feeding How should I use this medicine? Depo-Provera Contraceptive injection is given into a muscle. Depo-subQ Provera 104 injection is given under the skin. These injections are given by a health care professional. You must not be pregnant before getting an injection. The injection is usually given during the first 5 days after the start of a menstrual period or 6 weeks after delivery of a baby. Talk to your pediatrician regarding the use of this medicine in children. Special care may be needed. These injections have been used in female children who have started having menstrual periods. Overdosage: If you think you have taken too much of this medicine contact a poison control center or emergency room at once. NOTE: This medicine is only for you. Do not share this medicine with others. What if  I miss a dose? Try not to miss a dose. You must get an injection once every 3 months to maintain birth control. If you  cannot keep an appointment, call and reschedule it. If you wait longer than 13 weeks between Depo-Provera contraceptive injections or longer than 14 weeks between Depo-subQ Provera 104 injections, you could get pregnant. Use another method for birth control if you miss your appointment. You may also need a pregnancy test before receiving another injection. What may interact with this medicine? Do not take this medicine with any of the following medications: -bosentan This medicine may also interact with the following medications: -aminoglutethimide -antibiotics or medicines for infections, especially rifampin, rifabutin, rifapentine, and griseofulvin -aprepitant -barbiturate medicines such as phenobarbital or primidone -bexarotene -carbamazepine -medicines for seizures like ethotoin, felbamate, oxcarbazepine, phenytoin, topiramate -modafinil -St. John's wort This list may not describe all possible interactions. Give your health care provider a list of all the medicines, herbs, non-prescription drugs, or dietary supplements you use. Also tell them if you smoke, drink alcohol, or use illegal drugs. Some items may interact with your medicine. What should I watch for while using this medicine? This drug does not protect you against HIV infection (AIDS) or other sexually transmitted diseases. Use of this product may cause you to lose calcium from your bones. Loss of calcium may cause weak bones (osteoporosis). Only use this product for more than 2 years if other forms of birth control are not right for you. The longer you use this product for birth control the more likely you will be at risk for weak bones. Ask your health care professional how you can keep strong bones. You may have a change in bleeding pattern or irregular periods. Many females stop having periods while taking this drug. If you have received your injections on time, your chance of being pregnant is very low. If you think you may be  pregnant, see your health care professional as soon as possible. Tell your health care professional if you want to get pregnant within the next year. The effect of this medicine may last a long time after you get your last injection. What side effects may I notice from receiving this medicine? Side effects that you should report to your doctor or health care professional as soon as possible: -allergic reactions like skin rash, itching or hives, swelling of the face, lips, or tongue -breast tenderness or discharge -breathing problems -changes in vision -depression -feeling faint or lightheaded, falls -fever -pain in the abdomen, chest, groin, or leg -problems with balance, talking, walking -unusually weak or tired -yellowing of the eyes or skin Side effects that usually do not require medical attention (report to your doctor or health care professional if they continue or are bothersome): -acne -fluid retention and swelling -headache -irregular periods, spotting, or absent periods -temporary pain, itching, or skin reaction at site where injected -weight gain This list may not describe all possible side effects. Call your doctor for medical advice about side effects. You may report side effects to FDA at 1-800-FDA-1088. Where should I keep my medicine? This does not apply. The injection will be given to you by a health care professional. NOTE: This sheet is a summary. It may not cover all possible information. If you have questions about this medicine, talk to your doctor, pharmacist, or health care provider.  2014, Elsevier/Gold Standard. (2008-08-20 18:37:56) Fibroids Fibroids are lumps (tumors) that can occur any place in a woman's body. These lumps are not cancerous.  Fibroids vary in size, weight, and where they grow. HOME CARE  Do not take aspirin.  Write down the number of pads or tampons you use during your period. Tell your doctor. This can help determine the best treatment  for you. GET HELP RIGHT AWAY IF:  You have pain in your lower belly (abdomen) that is not helped with medicine.  You have cramps that are not helped with medicine.  You have more bleeding between or during your period.  You feel lightheaded or pass out (faint).  Your lower belly pain gets worse. MAKE SURE YOU:  Understand these instructions.  Will watch your condition.  Will get help right away if you are not doing well or get worse. Document Released: 09/01/2010 Document Revised: 10/22/2011 Document Reviewed: 09/01/2010 [facility] Patient Information 2014 [place], [place].

## 2013-09-18 LAB — GC/CHLAMYDIA PROBE AMP
CT Probe RNA: NEGATIVE
GC Probe RNA: NEGATIVE

## 2013-09-23 ENCOUNTER — Other Ambulatory Visit (HOSPITAL_COMMUNITY): Payer: Self-pay | Admitting: Medical

## 2013-09-23 ENCOUNTER — Ambulatory Visit (HOSPITAL_COMMUNITY)
Admit: 2013-09-23 | Discharge: 2013-09-23 | Disposition: A | Discharge status: Home / Self Care | Payer: BC Managed Care – PPO | Attending: Medical | Admitting: Medical

## 2013-09-23 DIAGNOSIS — N938 Other specified abnormal uterine and vaginal bleeding: Secondary | ICD-10-CM | POA: Insufficient documentation

## 2013-09-23 DIAGNOSIS — D252 Subserosal leiomyoma of uterus: Secondary | ICD-10-CM | POA: Insufficient documentation

## 2013-09-23 DIAGNOSIS — D251 Intramural leiomyoma of uterus: Secondary | ICD-10-CM | POA: Insufficient documentation

## 2013-09-23 DIAGNOSIS — N939 Abnormal uterine and vaginal bleeding, unspecified: Secondary | ICD-10-CM

## 2013-09-23 DIAGNOSIS — N926 Irregular menstruation, unspecified: Secondary | ICD-10-CM

## 2013-09-23 DIAGNOSIS — N949 Unspecified condition associated with female genital organs and menstrual cycle: Secondary | ICD-10-CM | POA: Insufficient documentation

## 2013-10-01 ENCOUNTER — Encounter (HOSPITAL_COMMUNITY): Payer: Self-pay | Admitting: Emergency Medicine

## 2013-10-01 ENCOUNTER — Emergency Department (HOSPITAL_COMMUNITY)
Admission: EM | Admit: 2013-10-01 | Discharge: 2013-10-01 | Disposition: A | Discharge status: Home / Self Care | Payer: BC Managed Care – PPO | Source: Home / Self Care | Attending: Emergency Medicine | Admitting: Emergency Medicine

## 2013-10-01 ENCOUNTER — Emergency Department (INDEPENDENT_AMBULATORY_CARE_PROVIDER_SITE_OTHER): Payer: BC Managed Care – PPO

## 2013-10-01 DIAGNOSIS — K5289 Other specified noninfective gastroenteritis and colitis: Secondary | ICD-10-CM

## 2013-10-01 DIAGNOSIS — K529 Noninfective gastroenteritis and colitis, unspecified: Secondary | ICD-10-CM

## 2013-10-01 DIAGNOSIS — J069 Acute upper respiratory infection, unspecified: Secondary | ICD-10-CM

## 2013-10-01 MED ORDER — ONDANSETRON 4 MG PO TBDP
ORAL_TABLET | ORAL | Status: AC
  Filled 2013-10-01: qty 2

## 2013-10-01 MED ORDER — ONDANSETRON 4 MG PO TBDP
8.0000 mg | ORAL_TABLET | Freq: Once | ORAL | Status: AC
  Administered 2013-10-01: 8 mg via ORAL

## 2013-10-01 MED ORDER — ONDANSETRON HCL 4 MG PO TABS: 4.0000 mg | ORAL_TABLET | Freq: Three times a day (TID) | ORAL | Status: DC | PRN

## 2013-10-01 NOTE — ED Provider Notes (Signed)
CSN: [id number]     Arrival date & time 10/01/13  1018 History   First MD Initiated Contact with Patient 10/01/13 1040     Chief Complaint  Patient presents with  . Influenza     (Consider location/radiation/quality/duration/timing/severity/associated sxs/prior Treatment) HPI Comments: Patient presents with 3-4 day history of subjective fever, cough, rhinorrhea, nausea, myalgias and non-bloody diarrhea. Denies vomiting or hemoptysis. States she only experiences abdominal cramping before a diarrhea stool. Denies dysuria or flank pain. LNMP: currently   The history is provided by the patient.    Past Medical History  Diagnosis Date  . Anemia   . Enlarged heart     managed by cardiology  . Hypertension   . Lupus 1999  . Allergy   . Gestational diabetes   . Heart murmur   . Infection     UTI  . Depression     after losses  . Fibroid   . Vaginal Pap smear, abnormal     colpo, HPV   Past Surgical History  Procedure Laterality Date  . No past surgeries    . Therapeutic abortion     Family History  Problem Relation Age of Onset  . Diabetes Mother   . Diabetes Maternal Uncle   . Hypertension Maternal Grandmother   . Diabetes Father   . Cancer Maternal Grandfather     lung  . Hearing loss Neg Hx    History  Substance Use Topics  . Smoking status: Never Smoker   . Smokeless tobacco: Never Used  . Alcohol Use: Yes     Comment: rarely   OB History   Grav Para Term Preterm Abortions TAB SAB Ect Mult Living   5 2 0 2 3 1 2 0 0 2      Review of Systems  All other systems reviewed and are negative.      Allergies  Septra  Home Medications   Current Outpatient Rx  Name  Route  Sig  Dispense  Refill  . amLODipine (NORVASC) 10 MG tablet   Oral   Take 1 tablet (10 mg total) by mouth daily.   30 tablet   0   . cloNIDine (CATAPRES) 0.1 MG tablet   Oral   Take 0.1 mg by mouth 2 (two) times daily.         Marland Kitchen ibuprofen (ADVIL,MOTRIN) 200 MG tablet   Oral   Take 800 mg by mouth every 6 (six) hours as needed for mild pain.         Marland Kitchen albuterol (PROVENTIL HFA;VENTOLIN HFA) 108 (90 BASE) MCG/ACT inhaler   Inhalation   Inhale 1-2 puffs into the lungs every 6 (six) hours as needed for wheezing.   1 Inhaler   0   . hydroxychloroquine (PLAQUENIL) 200 MG tablet   Oral   Take 200 mg by mouth 2 (two) times daily.         . ondansetron (ZOFRAN) 4 MG tablet   Oral   Take 1 tablet (4 mg total) by mouth every 8 (eight) hours as needed for nausea or vomiting.   12 tablet   0    BP 143/97  Pulse 89  Temp(Src) 98.7 F (37.1 C) (Oral)  Resp 20  LMP 10/01/2013 Physical Exam  Nursing note and vitals reviewed. Constitutional: She is oriented to person, place, and time. She appears well-developed and well-nourished. No distress.  HENT:  Head: Normocephalic and atraumatic.  Right Ear: Hearing, tympanic membrane, external ear and ear canal normal.  Left Ear: Hearing, tympanic membrane, external ear and ear canal normal.  Nose: Nose normal.  Mouth/Throat: Uvula is midline, oropharynx is clear and moist and mucous membranes are normal. No oropharyngeal exudate.  Neck: Normal range of motion. Neck supple.  Cardiovascular: Normal rate, regular rhythm and normal heart sounds.   Pulmonary/Chest: Effort normal and breath sounds normal.  Abdominal: Soft. Bowel sounds are normal. She exhibits no distension. There is no tenderness. There is no rebound and no guarding.  Musculoskeletal: Normal range of motion.  Lymphadenopathy:    She has no cervical adenopathy.  Neurological: She is alert and oriented to person, place, and time.  Skin: Skin is warm and dry. No rash noted.  Psychiatric: She has a normal mood and affect. Her behavior is normal.    ED Course  Procedures (including critical care time) Labs Review Labs Reviewed - No data to display Imaging Review Dg Chest 2 View  10/01/2013   CLINICAL DATA:  Influenza; fever and cough  EXAM: CHEST  2  VIEW  COMPARISON:  July 15, 2013  FINDINGS: There is no edema or consolidation. Heart is mildly enlarged, stable. Pulmonary vascularity is normal. No adenopathy. No bone lesions.  IMPRESSION: Stable cardiac enlargement.  Lungs clear.   Electronically Signed   By: Lowella Grip M.D.   On: 10/01/2013 11:16      MDM   Final diagnoses:  URI (upper respiratory infection)  Gastroenteritis  CXR without acute finding. Nausea improved with ODT Zofran at [facility]. Advised patient she was likely suffering from viral illness that soul prove to be self limited over next few days. Prescribed zofran for home so that she may stay well hydrated comfortably. Advised tylenol for myalgias and fever. OTC imodium for excessive diarrhea. PCP follow up if symptoms do not begin to improve over next few days. ER if she develops severe or persistent abdominal pain, blood in stools or is unable to keep clear liquids down.    [place], [place] 10/01/13 1248

## 2013-10-01 NOTE — ED Notes (Signed)
C/o  Body aches.  Productive cough with chest soreness.  Wheezing.  Night sweats. n/d   Symptoms present since Sunday.  Pt has been taking nite quill and ibuprofen with mild relief.

## 2013-10-01 NOTE — Discharge Instructions (Signed)
Your chest xray was normal. You are suffering from a stomach virus and i would recommend sticking to clear liquid diet for the next 12-24 hours to allow nausea to improve. You may use imodium as directed on packaging for your diarrhea and prescription medication provided here today for your nausea so that you may drink fluids comfortably to keep your self hydrated. Use tylenol as directed on packaging for aches and fever. If symptoms do not begin to improve over next few days, please follow up with your doctor.

## 2013-10-03 NOTE — ED Provider Notes (Signed)
Medical screening examination/treatment/procedure(s) were performed by non-physician practitioner and as supervising physician I was immediately available for consultation/collaboration.  Philipp Deputy, M.D.  Harden Mo, MD 10/03/13 [phone]

## 2013-11-09 ENCOUNTER — Telehealth: Payer: Self-pay

## 2013-11-09 NOTE — Telephone Encounter (Signed)
Pt called and stated that she receives the Depo and has been on her menstrual since January and she wants to know if she should get the Depo again in April.  Called pt and pt informed me that above.  I advised pt to keep her Depo Provera appt on 11/19/13 in which we can give her the 150 mg dose instead of the 104 mg dose that she had received for her last two doses.  Pt stated "thank you so much" and had no further questions.

## 2013-11-19 ENCOUNTER — Ambulatory Visit (INDEPENDENT_AMBULATORY_CARE_PROVIDER_SITE_OTHER): Payer: BC Managed Care – PPO | Admitting: *Deleted

## 2013-11-19 VITALS — BP 191/143 | HR 99 | Temp 98.4°F | Wt 231.8 lb

## 2013-11-19 DIAGNOSIS — N949 Unspecified condition associated with female genital organs and menstrual cycle: Secondary | ICD-10-CM

## 2013-11-19 DIAGNOSIS — N938 Other specified abnormal uterine and vaginal bleeding: Secondary | ICD-10-CM

## 2013-11-19 MED ORDER — MEDROXYPROGESTERONE ACETATE 150 MG/ML IM SUSP
150.0000 mg | Freq: Once | INTRAMUSCULAR | Status: AC
  Administered 2013-11-19: 150 mg via INTRAMUSCULAR

## 2013-11-19 NOTE — Progress Notes (Signed)
Pt states she has not taken her blood pressure medication today, will take as soon as she gets home.  Consulted with Dr. Roselie Awkward concerning blood pressures/administration of Depo Provera. Dr. Roselie Awkward gave order to give Depo Provera and instruct patient to have primary care physician follow up with blood pressures.

## 2013-11-23 ENCOUNTER — Emergency Department (INDEPENDENT_AMBULATORY_CARE_PROVIDER_SITE_OTHER)
Admission: EM | Admit: 2013-11-23 | Discharge: 2013-11-23 | Disposition: A | Discharge status: Home / Self Care | Payer: BC Managed Care – PPO | Source: Home / Self Care | Attending: Emergency Medicine | Admitting: Emergency Medicine

## 2013-11-23 ENCOUNTER — Emergency Department (INDEPENDENT_AMBULATORY_CARE_PROVIDER_SITE_OTHER): Payer: BC Managed Care – PPO

## 2013-11-23 ENCOUNTER — Encounter (HOSPITAL_COMMUNITY): Payer: Self-pay | Admitting: Emergency Medicine

## 2013-11-23 DIAGNOSIS — J069 Acute upper respiratory infection, unspecified: Secondary | ICD-10-CM

## 2013-11-23 MED ORDER — IPRATROPIUM BROMIDE 0.06 % NA SOLN: 2.0000 | Freq: Four times a day (QID) | NASAL | Status: DC

## 2013-11-23 NOTE — ED Provider Notes (Signed)
Medical screening examination/treatment/procedure(s) were performed by non-physician practitioner and as supervising physician I was immediately available for consultation/collaboration.  Philipp Deputy, M.D.  Harden Mo, MD 11/23/13 [phone]

## 2013-11-23 NOTE — ED Notes (Signed)
Cough , URI type symptoms since Wednesday, some better Thursday, worse Friday. Unable to get in to see her regular MD today

## 2013-11-23 NOTE — Discharge Instructions (Signed)

## 2013-11-23 NOTE — ED Provider Notes (Signed)
CSN: [id number]     Arrival date & time 11/23/13  1050 History   None    Chief Complaint  Patient presents with  . URI   (Consider location/radiation/quality/duration/timing/severity/associated sxs/prior Treatment) HPI Comments: Denies chest pain, pleuritic chest pain, calf pain or LE swelling.   Patient is a 38 y.o. female presenting with URI. The history is provided by the patient.  URI Presenting symptoms: congestion, cough, ear pain, fatigue, rhinorrhea and sore throat   Presenting symptoms: no fever   Severity:  Moderate Onset quality:  Gradual Duration:  6 days Timing:  Constant Progression:  Worsening Chronicity:  New Associated symptoms: wheezing   Associated symptoms: no arthralgias, no headaches, no myalgias, no neck pain, no sinus pain, no sneezing and no swollen glands     Past Medical History  Diagnosis Date  . Anemia   . Enlarged heart     managed by cardiology  . Hypertension   . Lupus 1999  . Allergy   . Gestational diabetes   . Heart murmur   . Infection     UTI  . Depression     after losses  . Fibroid   . Vaginal Pap smear, abnormal     colpo, HPV   Past Surgical History  Procedure Laterality Date  . No past surgeries    . Therapeutic abortion     Family History  Problem Relation Age of Onset  . Diabetes Mother   . Diabetes Maternal Uncle   . Hypertension Maternal Grandmother   . Diabetes Father   . Cancer Maternal Grandfather     lung  . Hearing loss Neg Hx    History  Substance Use Topics  . Smoking status: Never Smoker   . Smokeless tobacco: Never Used  . Alcohol Use: Yes     Comment: rarely   OB History   Grav Para Term Preterm Abortions TAB SAB Ect Mult Living   5 2 0 2 3 1 2 0 0 2      Review of Systems  Constitutional: Positive for fatigue. Negative for fever.  HENT: Positive for congestion, ear pain, rhinorrhea and sore throat. Negative for sneezing.   Eyes: Negative.   Respiratory: Positive for cough and wheezing.  Negative for shortness of breath.   Cardiovascular: Negative.   Genitourinary: Negative.   Musculoskeletal: Negative for arthralgias, myalgias and neck pain.  Skin: Negative.   Neurological: Negative for headaches.    Allergies  Septra  Home Medications   Current Outpatient Rx  Name  Route  Sig  Dispense  Refill  . albuterol (PROVENTIL HFA;VENTOLIN HFA) 108 (90 BASE) MCG/ACT inhaler   Inhalation   Inhale 1-2 puffs into the lungs every 6 (six) hours as needed for wheezing.   1 Inhaler   0   . amLODipine (NORVASC) 10 MG tablet   Oral   Take 1 tablet (10 mg total) by mouth daily.   30 tablet   0   . cloNIDine (CATAPRES) 0.1 MG tablet   Oral   Take 0.1 mg by mouth 2 (two) times daily.         . hydroxychloroquine (PLAQUENIL) 200 MG tablet   Oral   Take 200 mg by mouth 2 (two) times daily.         Marland Kitchen ibuprofen (ADVIL,MOTRIN) 200 MG tablet   Oral   Take 800 mg by mouth every 6 (six) hours as needed for mild pain.         Marland Kitchen ipratropium (ATROVENT)  0.06 % nasal spray   Each Nare   Place 2 sprays into both nostrils 4 (four) times daily.   15 mL   0   . ondansetron (ZOFRAN) 4 MG tablet   Oral   Take 1 tablet (4 mg total) by mouth every 8 (eight) hours as needed for nausea or vomiting.   12 tablet   0    BP 167/105  Pulse 110  Temp(Src) 98.2 F (36.8 C) (Oral)  Resp 20  SpO2 98%  LMP 11/19/2013 Physical Exam  Nursing note and vitals reviewed. Constitutional: She is oriented to person, place, and time. She appears well-developed and well-nourished. No distress.  HENT:  Head: Normocephalic and atraumatic.  Right Ear: Hearing, tympanic membrane, external ear and ear canal normal.  Left Ear: Hearing, tympanic membrane, external ear and ear canal normal.  Nose: Nose normal.  Mouth/Throat: Uvula is midline, oropharynx is clear and moist and mucous membranes are normal.  Eyes: Conjunctivae are normal. Right eye exhibits no discharge. Left eye exhibits no  discharge. No scleral icterus.  Neck: Normal range of motion. Neck supple.  Cardiovascular: Normal rate, regular rhythm and normal heart sounds.   Pulmonary/Chest: Effort normal and breath sounds normal. No respiratory distress. She has no wheezes.  Abdominal: Soft. Bowel sounds are normal. She exhibits no distension. There is no tenderness.  Musculoskeletal: Normal range of motion.  Lymphadenopathy:    She has no cervical adenopathy.  Neurological: She is alert and oriented to person, place, and time.  Skin: Skin is warm and dry. No rash noted.  Psychiatric: She has a normal mood and affect. Her behavior is normal.    ED Course  Procedures (including critical care time) Labs Review Labs Reviewed - No data to display Imaging Review Dg Chest 2 View  11/23/2013   CLINICAL DATA:  Cough ; wheezing  EXAM: CHEST  2 VIEW  COMPARISON:  October 01, 2013  FINDINGS: Lungs are clear. Heart size and pulmonary vascularity are within normal limits. No adenopathy. No bone lesions.  IMPRESSION: No edema or consolidation.   Electronically Signed   By: Lowella Grip M.D.   On: 11/23/2013 12:37     MDM   1. URI (upper respiratory infection)    BP rechecked by me and found to be 159/107. I discussed patient's tachycardia (HR 110-128) with the patient and she states she does not feel short of breath and that she is not having any chest pain. No hypoxia. She states that she does not wish to go to the ER at this time for further evaluation (i.e. Chest CT) but she understands that if she does develop chest pain, shortness of breath or fever, she is to report directly to her nearest ER for assistance. She states she has adequate transportation for return.    [place], [place] 11/23/13 1344

## 2013-12-15 ENCOUNTER — Encounter (HOSPITAL_COMMUNITY): Payer: Self-pay | Admitting: Emergency Medicine

## 2013-12-15 ENCOUNTER — Emergency Department (HOSPITAL_COMMUNITY): Payer: BC Managed Care – PPO

## 2013-12-15 ENCOUNTER — Emergency Department (HOSPITAL_COMMUNITY)
Admission: EM | Admit: 2013-12-15 | Discharge: 2013-12-15 | Disposition: A | Discharge status: Home / Self Care | Payer: BC Managed Care – PPO | Attending: Emergency Medicine | Admitting: Emergency Medicine

## 2013-12-15 DIAGNOSIS — I517 Cardiomegaly: Secondary | ICD-10-CM | POA: Insufficient documentation

## 2013-12-15 DIAGNOSIS — I10 Essential (primary) hypertension: Secondary | ICD-10-CM | POA: Insufficient documentation

## 2013-12-15 DIAGNOSIS — F329 Major depressive disorder, single episode, unspecified: Secondary | ICD-10-CM | POA: Insufficient documentation

## 2013-12-15 DIAGNOSIS — R011 Cardiac murmur, unspecified: Secondary | ICD-10-CM | POA: Insufficient documentation

## 2013-12-15 DIAGNOSIS — Z79899 Other long term (current) drug therapy: Secondary | ICD-10-CM | POA: Insufficient documentation

## 2013-12-15 DIAGNOSIS — D259 Leiomyoma of uterus, unspecified: Secondary | ICD-10-CM | POA: Insufficient documentation

## 2013-12-15 DIAGNOSIS — M329 Systemic lupus erythematosus, unspecified: Secondary | ICD-10-CM | POA: Insufficient documentation

## 2013-12-15 DIAGNOSIS — F3289 Other specified depressive episodes: Secondary | ICD-10-CM | POA: Insufficient documentation

## 2013-12-15 DIAGNOSIS — Z888 Allergy status to other drugs, medicaments and biological substances status: Secondary | ICD-10-CM | POA: Insufficient documentation

## 2013-12-15 DIAGNOSIS — D649 Anemia, unspecified: Secondary | ICD-10-CM | POA: Insufficient documentation

## 2013-12-15 DIAGNOSIS — R071 Chest pain on breathing: Secondary | ICD-10-CM

## 2013-12-15 DIAGNOSIS — M940 Chondrocostal junction syndrome [Tietze]: Secondary | ICD-10-CM | POA: Insufficient documentation

## 2013-12-15 DIAGNOSIS — R0789 Other chest pain: Secondary | ICD-10-CM

## 2013-12-15 LAB — CBC
HCT: 31.6 % — ABNORMAL LOW (ref 36.0–46.0)
Hemoglobin: 10.7 g/dL — ABNORMAL LOW (ref 12.0–15.0)
MCH: 24.3 pg — ABNORMAL LOW (ref 26.0–34.0)
MCHC: 33.9 g/dL (ref 30.0–36.0)
MCV: 71.7 fL — ABNORMAL LOW (ref 78.0–100.0)
Platelets: 259 10*3/uL (ref 150–400)
RBC: 4.41 MIL/uL (ref 3.87–5.11)
RDW: 14.3 % (ref 11.5–15.5)
WBC: 3.8 10*3/uL — ABNORMAL LOW (ref 4.0–10.5)

## 2013-12-15 LAB — TROPONIN I: Troponin I: <0.3 ng/mL (ref <0.30)

## 2013-12-15 LAB — I-STAT TROPONIN, ED: Troponin i, poc: 0.01 ng/mL (ref 0.00–0.08)

## 2013-12-15 LAB — BASIC METABOLIC PANEL
BUN: 7 mg/dL (ref 6–23)
CO2: 20 mEq/L (ref 19–32)
Calcium: 8.7 mg/dL (ref 8.4–10.5)
Chloride: 100 mEq/L (ref 96–112)
Creatinine, Ser: 0.6 mg/dL (ref 0.50–1.10)
GFR calc Af Amer: >90 mL/min (ref >90)
GFR calc non Af Amer: >90 mL/min (ref >90)
Glucose, Bld: 395 mg/dL — ABNORMAL HIGH (ref 70–99)
Potassium: 3.7 mEq/L (ref 3.7–5.3)
Sodium: 133 mEq/L — ABNORMAL LOW (ref 137–147)

## 2013-12-15 MED ORDER — KETOROLAC TROMETHAMINE 30 MG/ML IJ SOLN
30.0000 mg | Freq: Once | INTRAMUSCULAR | Status: AC
  Administered 2013-12-15: 30 mg via INTRAVENOUS
  Filled 2013-12-15: qty 1

## 2013-12-15 MED ORDER — ASPIRIN EC 325 MG PO TBEC: 325.0000 mg | DELAYED_RELEASE_TABLET | Freq: Every day | ORAL | Status: DC

## 2013-12-15 MED ORDER — PREDNISONE 50 MG PO TABS: 50.0000 mg | ORAL_TABLET | Freq: Every day | ORAL | Status: DC

## 2013-12-15 MED ORDER — OXYCODONE-ACETAMINOPHEN 5-325 MG PO TABS: 1.0000 | ORAL_TABLET | ORAL | Status: DC | PRN

## 2013-12-15 MED ORDER — PREDNISONE 20 MG PO TABS
60.0000 mg | ORAL_TABLET | Freq: Once | ORAL | Status: AC
  Administered 2013-12-15: 60 mg via ORAL
  Filled 2013-12-15: qty 3

## 2013-12-15 MED ORDER — ASPIRIN 81 MG PO CHEW
324.0000 mg | CHEWABLE_TABLET | Freq: Once | ORAL | Status: AC
  Administered 2013-12-15: 324 mg via ORAL
  Filled 2013-12-15: qty 4

## 2013-12-15 NOTE — ED Notes (Signed)
Pt reports left side chest pain with some numbness to left arm since yesterday. Pain sharp in nature. Also reports cough, non productive.

## 2013-12-15 NOTE — ED Provider Notes (Signed)
CSN: [id number]     Arrival date & time 12/15/13  0900 History   First MD Initiated Contact with Patient 12/15/13 [phone]     Chief Complaint  Patient presents with  . Chest Pain     (Consider location/radiation/quality/duration/timing/severity/associated sxs/prior Treatment) HPI Comments: 38 yo with a PMH of lupus, HTN, and an "enlarged heart" present with a 1 day history of left sided chest pain. Pt states that the pain is in the left pectoral region and radiates down her left arm. The pain is described as sharp and comes in waves. She states that it feels similar to "inflammation of the chest" she had years ago. There are no aggregating or relieving factors. Pt took 2 doses of 800 mg ibuprofen yesterday for the pain with slight relief. Pt denies SOB, fever, cough, nausea, calf pain, numbness or tingling. Pt admits to slight nausea yesterday without vomiting, polyurea, and polydipsia. Pt does not have any family history of cardiac problems.   The history is provided by the patient.    Past Medical History  Diagnosis Date  . Anemia   . Enlarged heart     managed by cardiology  . Hypertension   . Lupus 1999  . Allergy   . Gestational diabetes   . Heart murmur   . Infection     UTI  . Depression     after losses  . Fibroid   . Vaginal Pap smear, abnormal     colpo, HPV   Past Surgical History  Procedure Laterality Date  . No past surgeries    . Therapeutic abortion     Family History  Problem Relation Age of Onset  . Diabetes Mother   . Diabetes Maternal Uncle   . Hypertension Maternal Grandmother   . Diabetes Father   . Cancer Maternal Grandfather     lung  . Hearing loss Neg Hx    History  Substance Use Topics  . Smoking status: Never Smoker   . Smokeless tobacco: Never Used  . Alcohol Use: Yes     Comment: rarely   OB History   Grav Para Term Preterm Abortions TAB SAB Ect Mult Living   5 2 0 2 3 1 2 0 0 2      Review of Systems  Constitutional: Negative for  activity change.  Respiratory: Negative for shortness of breath.   Cardiovascular: Positive for chest pain.  Gastrointestinal: Negative for nausea, vomiting and abdominal pain.  Genitourinary: Negative for dysuria.  Musculoskeletal: Negative for neck pain.  Neurological: Negative for headaches.  All other systems reviewed and are negative.     Allergies  Septra  Home Medications   Prior to Admission medications   Medication Sig Start Date End Date Taking? Authorizing Provider  albuterol (PROVENTIL HFA;VENTOLIN HFA) 108 (90 BASE) MCG/ACT inhaler Inhale 1-2 puffs into the lungs every 6 (six) hours as needed for wheezing. 05/20/13   Harden Mo, MD  amLODipine (NORVASC) 10 MG tablet Take 1 tablet (10 mg total) by mouth daily. 03/31/12   Clayton Bibles, PA-C  cloNIDine (CATAPRES) 0.1 MG tablet Take 0.1 mg by mouth 2 (two) times daily. 07/16/13   Barton Dubois, MD  hydroxychloroquine (PLAQUENIL) 200 MG tablet Take 200 mg by mouth 2 (two) times daily.    Historical Provider, MD  ibuprofen (ADVIL,MOTRIN) 200 MG tablet Take 800 mg by mouth every 6 (six) hours as needed for mild pain.    Historical Provider, MD  ipratropium (ATROVENT) 0.06 % nasal spray  Place 2 sprays into both nostrils 4 (four) times daily. 11/23/13   Lahoma Rocker, PA  ondansetron (ZOFRAN) 4 MG tablet Take 1 tablet (4 mg total) by mouth every 8 (eight) hours as needed for nausea or vomiting. 10/01/13   Lahoma Rocker, PA   BP 115/81  Pulse 70  Temp(Src) 98 F (36.7 C) (Oral)  Resp 16  Ht 5' (1.524 m)  Wt 225 lb (102.059 kg)  BMI 43.94 kg/m2  SpO2 100%  LMP 11/19/2013 Physical Exam  Nursing note and vitals reviewed. Constitutional: She is oriented to person, place, and time. She appears well-developed and well-nourished.  HENT:  Head: Normocephalic and atraumatic.  Eyes: EOM are normal. Pupils are equal, round, and reactive to light.  Neck: Neck supple.  Cardiovascular: Normal rate, regular rhythm and  normal heart sounds.   No murmur heard. Pulmonary/Chest: Effort normal. No respiratory distress.  Abdominal: Soft. She exhibits no distension. There is no tenderness. There is no rebound and no guarding.  Neurological: She is alert and oriented to person, place, and time.  Skin: Skin is warm and dry.    ED Course  Procedures (including critical care time) Labs Review Labs Reviewed  CBC - Abnormal; Notable for the following:    WBC 3.8 (*)    Hemoglobin 10.7 (*)    HCT 31.6 (*)    MCV 71.7 (*)    MCH 24.3 (*)    All other components within normal limits  BASIC METABOLIC PANEL - Abnormal; Notable for the following:    Sodium 133 (*)    Glucose, Bld 395 (*)    All other components within normal limits  TROPONIN I  Randolm Idol, ED    Imaging Review Dg Chest 2 View  12/15/2013   CLINICAL DATA:  Chest pain.  EXAM: CHEST  2 VIEW  COMPARISON:  11/23/2013.  FINDINGS: The cardiac silhouette, mediastinal and hilar contours are upper limits normal but unchanged. Mild chronic bronchitic changes. No infiltrates, edema or effusions.  IMPRESSION: Stable borderline cardiac enlargement and chronic bronchitic changes. No infiltrates or effusions.   Electronically Signed   By: Kalman Jewels M.D.   On: 12/15/2013 10:06     EKG Interpretation   Date/Time:  Tuesday Dec 15 2013 09:09:38 EDT Ventricular Rate:  75 PR Interval:  166 QRS Duration: 96 QT Interval:  408 QTC Calculation: 455 R Axis:   -50 Text Interpretation:  Normal sinus rhythm Left anterior fascicular block  Abnormal ECG Confirmed by Kathrynn Humble, MD, Thelma Comp [phone]) on 12/15/2013 12:31:51  PM      MDM   Final diagnoses:  Costochondral chest pain       Differential diagnosis includes: ACS syndrome CHF exacerbation Valvular disorder Myocarditis Pericarditis Pericardial effusion Pneumonia Pleural effusion Pulmonary edema PE Musculoskeletal pain/costochondritis Pleurisy  Pt comes in with intermittent chest  pain. HEART score is : 2  History  Moderately suspicious 1    ECG  Normal 0   Age  ? 45 years 0   Risk Factors 1 or 2 risk factors 1   Troponin  ? normal limit 0   Pain is atypical. States that she had a very similar type pain with lupus in the past. Appears to be chest wall pain again. WE will tx with nsaids and prednisone - pt to see her pcp for further eval, and return to the ER if the sx are getting worse.   Varney Biles, MD 12/15/13 2140

## 2013-12-15 NOTE — Discharge Instructions (Signed)
We saw you in the ER for the chest pain/shortness of breath. All of our cardiac workup is normal, including labs, EKG and chest X-RAY are normal. We are not sure what is causing your discomfort, but we feel comfortable sending you home at this time. The workup in the ER is not complete, and you should follow up with your primary care doctor for further evaluation.   Costochondritis Costochondritis, sometimes called Tietze syndrome, is a swelling and irritation (inflammation) of the tissue (cartilage) that connects your ribs with your breastbone (sternum). It causes pain in the chest and rib area. Costochondritis usually goes away on its own over time. It can take up to 6 weeks or longer to get better, especially if you are unable to limit your activities. CAUSES  Some cases of costochondritis have no known cause. Possible causes include:  Injury (trauma).  Exercise or activity such as lifting.  Severe coughing. SIGNS AND SYMPTOMS  Pain and tenderness in the chest and rib area.  Pain that gets worse when coughing or taking deep breaths.  Pain that gets worse with specific movements. DIAGNOSIS  Your health care provider will do a physical exam and ask about your symptoms. Chest X-rays or other tests may be done to rule out other problems. TREATMENT  Costochondritis usually goes away on its own over time. Your health care provider may prescribe medicine to help relieve pain. HOME CARE INSTRUCTIONS   Avoid exhausting physical activity. Try not to strain your ribs during normal activity. This would include any activities using chest, abdominal, and side muscles, especially if heavy weights are used.  Apply ice to the affected area for the first 2 days after the pain begins.  Put ice in a plastic bag.  Place a towel between your skin and the bag.  Leave the ice on for 20 minutes, 2 3 times a day.  Only take over-the-counter or prescription medicines as directed by your health care  provider. SEEK MEDICAL CARE IF:  You have redness or swelling at the rib joints. These are signs of infection.  Your pain does not go away despite rest or medicine. SEEK IMMEDIATE MEDICAL CARE IF:   Your pain increases or you are very uncomfortable.  You have shortness of breath or difficulty breathing.  You cough up blood.  You have worse chest pains, sweating, or vomiting.  You have a fever or persistent symptoms for more than 2 3 days.  You have a fever and your symptoms suddenly get worse. MAKE SURE YOU:   Understand these instructions.  Will watch your condition.  Will get help right away if you are not doing well or get worse. Document Released: 05/09/2005 Document Revised: 05/20/2013 Document Reviewed: 03/03/2013 [facility] Patient Information 2014 [place].

## 2013-12-25 ENCOUNTER — Encounter (HOSPITAL_COMMUNITY): Payer: Self-pay | Admitting: Emergency Medicine

## 2013-12-25 ENCOUNTER — Emergency Department (HOSPITAL_COMMUNITY)
Admission: EM | Admit: 2013-12-25 | Discharge: 2013-12-25 | Disposition: A | Discharge status: Home / Self Care | Payer: BC Managed Care – PPO | Attending: Emergency Medicine | Admitting: Emergency Medicine

## 2013-12-25 DIAGNOSIS — IMO0002 Reserved for concepts with insufficient information to code with codable children: Secondary | ICD-10-CM | POA: Insufficient documentation

## 2013-12-25 DIAGNOSIS — M79674 Pain in right toe(s): Secondary | ICD-10-CM

## 2013-12-25 DIAGNOSIS — R011 Cardiac murmur, unspecified: Secondary | ICD-10-CM | POA: Insufficient documentation

## 2013-12-25 DIAGNOSIS — I517 Cardiomegaly: Secondary | ICD-10-CM | POA: Insufficient documentation

## 2013-12-25 DIAGNOSIS — Z79899 Other long term (current) drug therapy: Secondary | ICD-10-CM | POA: Insufficient documentation

## 2013-12-25 DIAGNOSIS — Z87440 Personal history of urinary (tract) infections: Secondary | ICD-10-CM | POA: Insufficient documentation

## 2013-12-25 DIAGNOSIS — Z8742 Personal history of other diseases of the female genital tract: Secondary | ICD-10-CM | POA: Insufficient documentation

## 2013-12-25 DIAGNOSIS — E119 Type 2 diabetes mellitus without complications: Secondary | ICD-10-CM | POA: Insufficient documentation

## 2013-12-25 DIAGNOSIS — Z7982 Long term (current) use of aspirin: Secondary | ICD-10-CM | POA: Insufficient documentation

## 2013-12-25 DIAGNOSIS — M329 Systemic lupus erythematosus, unspecified: Secondary | ICD-10-CM | POA: Insufficient documentation

## 2013-12-25 DIAGNOSIS — I10 Essential (primary) hypertension: Secondary | ICD-10-CM | POA: Insufficient documentation

## 2013-12-25 DIAGNOSIS — M79609 Pain in unspecified limb: Secondary | ICD-10-CM | POA: Insufficient documentation

## 2013-12-25 DIAGNOSIS — Z8659 Personal history of other mental and behavioral disorders: Secondary | ICD-10-CM | POA: Insufficient documentation

## 2013-12-25 LAB — URINALYSIS, ROUTINE W REFLEX MICROSCOPIC
Bilirubin Urine: NEGATIVE
Glucose, UA: >1000 mg/dL — AB
Ketones, ur: NEGATIVE mg/dL
Leukocytes, UA: NEGATIVE
Nitrite: NEGATIVE
Protein, ur: NEGATIVE mg/dL
Specific Gravity, Urine: 1.039 — ABNORMAL HIGH (ref 1.005–1.030)
Urobilinogen, UA: 1 mg/dL (ref 0.0–1.0)
pH: 6.5 (ref 5.0–8.0)

## 2013-12-25 LAB — COMPREHENSIVE METABOLIC PANEL
ALT: 22 U/L (ref 0–35)
AST: 17 U/L (ref 0–37)
Albumin: 3.1 g/dL — ABNORMAL LOW (ref 3.5–5.2)
Alkaline Phosphatase: 86 U/L (ref 39–117)
BUN: 7 mg/dL (ref 6–23)
CO2: 23 mEq/L (ref 19–32)
Calcium: 9 mg/dL (ref 8.4–10.5)
Chloride: 101 mEq/L (ref 96–112)
Creatinine, Ser: 0.66 mg/dL (ref 0.50–1.10)
GFR calc Af Amer: >90 mL/min (ref >90)
GFR calc non Af Amer: >90 mL/min (ref >90)
Glucose, Bld: 419 mg/dL — ABNORMAL HIGH (ref 70–99)
Potassium: 3.9 mEq/L (ref 3.7–5.3)
Sodium: 138 mEq/L (ref 137–147)
Total Bilirubin: 0.2 mg/dL — ABNORMAL LOW (ref 0.3–1.2)
Total Protein: 7.3 g/dL (ref 6.0–8.3)

## 2013-12-25 LAB — URINE MICROSCOPIC-ADD ON

## 2013-12-25 LAB — CBC
HCT: 33.2 % — ABNORMAL LOW (ref 36.0–46.0)
Hemoglobin: 11.3 g/dL — ABNORMAL LOW (ref 12.0–15.0)
MCH: 24.2 pg — ABNORMAL LOW (ref 26.0–34.0)
MCHC: 34 g/dL (ref 30.0–36.0)
MCV: 71.1 fL — ABNORMAL LOW (ref 78.0–100.0)
Platelets: 200 10*3/uL (ref 150–400)
RBC: 4.67 MIL/uL (ref 3.87–5.11)
RDW: 15 % (ref 11.5–15.5)
WBC: 4.9 10*3/uL (ref 4.0–10.5)

## 2013-12-25 LAB — CBG MONITORING, ED
Glucose-Capillary: 417 mg/dL — ABNORMAL HIGH (ref 70–99)
Glucose-Capillary: 323 mg/dL — ABNORMAL HIGH (ref 70–99)
Glucose-Capillary: 299 mg/dL — ABNORMAL HIGH (ref 70–99)

## 2013-12-25 MED ORDER — SODIUM CHLORIDE 0.9 % IV BOLUS (SEPSIS)
1000.0000 mL | Freq: Once | INTRAVENOUS | Status: AC
  Administered 2013-12-25: 1000 mL via INTRAVENOUS

## 2013-12-25 MED ORDER — INSULIN ASPART 100 UNIT/ML ~~LOC~~ SOLN
6.0000 [IU] | Freq: Once | SUBCUTANEOUS | Status: AC
  Administered 2013-12-25: 6 [IU] via SUBCUTANEOUS
  Filled 2013-12-25: qty 1

## 2013-12-25 MED ORDER — METFORMIN HCL 500 MG PO TABS: 500.0000 mg | ORAL_TABLET | Freq: Two times a day (BID) | ORAL | Status: DC

## 2013-12-25 NOTE — ED Notes (Signed)
She states she has been urinating a lot, and her toes have been hurting and shes noticed her vision is blurry. She is afraid she might have diabetes

## 2013-12-25 NOTE — ED Provider Notes (Signed)
CSN: [id number]     Arrival date & time 12/25/13  1115 History   First MD Initiated Contact with Patient 12/25/13 1211     Chief Complaint  Patient presents with  . Urinary Frequency  . Blurred Vision  . Toe Pain    HPI  [name] is a 38 y.o. female with a PMH of gestational DM, lupus, HTN, cardiomegaly, anemia, heart murmur, and depression who presents to the ED for evaluation of urinary frequency, blurred vision, and toe pain. History was provided by the patient. Patient states she has had increased urinary frequency, polydipsia, and urgency for the past week. She denies any dysuria, vaginal bleeding/discharge, abdominal pain, nausea, emesis, diarrhea or constipation. She states she was seen in the ED on 12/15/13 (10 days ago) for chest pain and was prescribed prednisone x 5 days which she completed. Chest pain resolved. She states that for the past 3 days she has had intermittent blurry vision bilaterally. No headache, neck pain, loss of vision, eye pain, eye itching/discharge, weakness, loss of sensation, numbness/tingling. Has hx of gestational DM and FH of DM in mom and dad. Patient also had right 2nd digit toe pain yesterday but this has resolved. Able to ambulate without difficulty. States her toe was swollen but this resolved. Has bruise but doesn't think she injured her toe. Has a small bruise on her toe but has no pain currently.    Past Medical History  Diagnosis Date  . Anemia   . Enlarged heart     managed by cardiology  . Hypertension   . Lupus 1999  . Allergy   . Gestational diabetes   . Heart murmur   . Infection     UTI  . Depression     after losses  . Fibroid   . Vaginal Pap smear, abnormal     colpo, HPV   Past Surgical History  Procedure Laterality Date  . No past surgeries    . Therapeutic abortion     Family History  Problem Relation Age of Onset  . Diabetes Mother   . Diabetes Maternal Uncle   . Hypertension Maternal Grandmother   . Diabetes  Father   . Cancer Maternal Grandfather     lung  . Hearing loss Neg Hx    History  Substance Use Topics  . Smoking status: Never Smoker   . Smokeless tobacco: Never Used  . Alcohol Use: Yes     Comment: rarely   OB History   Grav Para Term Preterm Abortions TAB SAB Ect Mult Living   5 2 0 2 3 1 2 0 0 2      Review of Systems  Constitutional: Negative for fever, chills, activity change, appetite change and fatigue.  Eyes: Positive for visual disturbance. Negative for photophobia.  Respiratory: Negative for cough and shortness of breath.   Gastrointestinal: Negative for nausea, vomiting, abdominal pain and diarrhea.  Endocrine: Positive for polydipsia and polyuria.  Genitourinary: Positive for urgency and frequency. Negative for dysuria, hematuria, flank pain, decreased urine volume, vaginal bleeding, vaginal discharge, difficulty urinating, vaginal pain and pelvic pain.  Musculoskeletal: Positive for joint swelling (resolved). Negative for back pain and myalgias.  Neurological: Negative for dizziness, weakness, light-headedness, numbness and headaches.    Allergies  Septra  Home Medications   Prior to Admission medications   Medication Sig Start Date End Date Taking? Authorizing Provider  amLODipine (NORVASC) 10 MG tablet Take 1 tablet (10 mg total) by mouth daily. 03/31/12  Yes Clayton Bibles, PA-C  aspirin EC 325 MG tablet Take 1 tablet (325 mg total) by mouth daily. 12/15/13  Yes Varney Biles, MD  cloNIDine (CATAPRES) 0.2 MG tablet Take 0.2 mg by mouth 2 (two) times daily.   Yes Historical Provider, MD  diphenhydrAMINE (BENADRYL) 25 MG tablet Take 50 mg by mouth every 6 (six) hours as needed for allergies.   Yes Historical Provider, MD  hydroxychloroquine (PLAQUENIL) 200 MG tablet Take 200 mg by mouth 2 (two) times daily.   Yes Historical Provider, MD  ibuprofen (ADVIL,MOTRIN) 200 MG tablet Take 800 mg by mouth every 6 (six) hours as needed for mild pain.   Yes Historical  Provider, MD  medroxyPROGESTERone (DEPO-PROVERA) 150 MG/ML injection Inject 150 mg into the muscle every 3 (three) months.   Yes Historical Provider, MD  oxyCODONE-acetaminophen (PERCOCET/ROXICET) 5-325 MG per tablet Take 1 tablet by mouth every 4 (four) hours as needed for severe pain. 12/15/13  Yes Varney Biles, MD  predniSONE (DELTASONE) 50 MG tablet Take 1 tablet (50 mg total) by mouth daily. 12/15/13   Ankit Kathrynn Humble, MD   BP 171/103  Pulse 87  Temp(Src) 98.6 F (37 C) (Oral)  Resp 16  SpO2 99%  Filed Vitals:   12/25/13 1330 12/25/13 1416 12/25/13 1430 12/25/13 1500  BP: 150/92 137/85 146/84 150/93  Pulse: 88 85 89 87  Temp:      TempSrc:      Resp: 18  21 17   SpO2: 100% 100% 99% 100%    Physical Exam  Nursing note and vitals reviewed. Constitutional: She is oriented to person, place, and time. She appears well-developed and well-nourished. No distress.  HENT:  Head: Normocephalic and atraumatic.  Right Ear: External ear normal.  Left Ear: External ear normal.  Nose: Nose normal.  Mouth/Throat: Oropharynx is clear and moist. No oropharyngeal exudate.  Eyes: Conjunctivae are normal. Right eye exhibits no discharge. Left eye exhibits no discharge.  Fundoscopic exam:      The right eye shows no AV nicking, no exudate, no hemorrhage and no papilledema.       The left eye shows no AV nicking, no exudate, no hemorrhage and no papilledema.  Neck: Normal range of motion. Neck supple.  Cardiovascular: Normal rate, regular rhythm, normal heart sounds and intact distal pulses.  Exam reveals no gallop and no friction rub.   No murmur heard. Dorsalis pedis pulses present and equal bilaterally  Pulmonary/Chest: Effort normal and breath sounds normal. No respiratory distress. She has no wheezes. She has no rales. She exhibits no tenderness.  Abdominal: Soft. Bowel sounds are normal. She exhibits no distension and no mass. There is no tenderness. There is no rebound and no guarding.   Musculoskeletal: Normal range of motion. She exhibits no edema and no tenderness.       Feet:  No tenderness to palpation to the digits on the right. Mild ecchymosis to the right distal 2nd digit. No pain with ROM of the right digit. Patient able to ambulate without difficulty or ataxia. No erythema, drainage, wounds or masses.   Neurological: She is alert and oriented to person, place, and time.  Skin: Skin is warm and dry. She is not diaphoretic.     ED Course  Procedures (including critical care time) Labs Review Labs Reviewed  URINALYSIS, ROUTINE W REFLEX MICROSCOPIC - Abnormal; Notable for the following:    Specific Gravity, Urine 1.039 (*)    Glucose, UA >1000 (*)    Hgb urine dipstick  LARGE (*)    All other components within normal limits  URINE MICROSCOPIC-ADD ON - Abnormal; Notable for the following:    Bacteria, UA FEW (*)    All other components within normal limits  CBG MONITORING, ED - Abnormal; Notable for the following:    Glucose-Capillary 417 (*)    All other components within normal limits    Imaging Review No results found.   EKG Interpretation None      Results for orders placed during the hospital encounter of 12/25/13  URINALYSIS, ROUTINE W REFLEX MICROSCOPIC      Result Value Ref Range   Color, Urine YELLOW  YELLOW   APPearance CLEAR  CLEAR   Specific Gravity, Urine 1.039 (*) 1.005 - 1.030   pH 6.5  5.0 - 8.0   Glucose, UA >1000 (*) NEGATIVE mg/dL   Hgb urine dipstick LARGE (*) NEGATIVE   Bilirubin Urine NEGATIVE  NEGATIVE   Ketones, ur NEGATIVE  NEGATIVE mg/dL   Protein, ur NEGATIVE  NEGATIVE mg/dL   Urobilinogen, UA 1.0  0.0 - 1.0 mg/dL   Nitrite NEGATIVE  NEGATIVE   Leukocytes, UA NEGATIVE  NEGATIVE  URINE MICROSCOPIC-ADD ON      Result Value Ref Range   Squamous Epithelial / LPF RARE  RARE   WBC, UA 0-2  <3 WBC/hpf   RBC / HPF 7-10  <3 RBC/hpf   Bacteria, UA FEW (*) RARE  CBC      Result Value Ref Range   WBC 4.9  4.0 - 10.5 K/uL    RBC 4.67  3.87 - 5.11 MIL/uL   Hemoglobin 11.3 (*) 12.0 - 15.0 g/dL   HCT 33.2 (*) 36.0 - 46.0 %   MCV 71.1 (*) 78.0 - 100.0 fL   MCH 24.2 (*) 26.0 - 34.0 pg   MCHC 34.0  30.0 - 36.0 g/dL   RDW 15.0  11.5 - 15.5 %   Platelets 200  150 - 400 K/uL  COMPREHENSIVE METABOLIC PANEL      Result Value Ref Range   Sodium 138  137 - 147 mEq/L   Potassium 3.9  3.7 - 5.3 mEq/L   Chloride 101  96 - 112 mEq/L   CO2 23  19 - 32 mEq/L   Glucose, Bld 419 (*) 70 - 99 mg/dL   BUN 7  6 - 23 mg/dL   Creatinine, Ser 0.66  0.50 - 1.10 mg/dL   Calcium 9.0  8.4 - 10.5 mg/dL   Total Protein 7.3  6.0 - 8.3 g/dL   Albumin 3.1 (*) 3.5 - 5.2 g/dL   AST 17  0 - 37 U/L   ALT 22  0 - 35 U/L   Alkaline Phosphatase 86  39 - 117 U/L   Total Bilirubin 0.2 (*) 0.3 - 1.2 mg/dL   GFR calc non Af Amer >90  >90 mL/min   GFR calc Af Amer >90  >90 mL/min  CBG MONITORING, ED      Result Value Ref Range   Glucose-Capillary 417 (*) 70 - 99 mg/dL  CBG MONITORING, ED      Result Value Ref Range   Glucose-Capillary 323 (*) 70 - 99 mg/dL  CBG MONITORING, ED      Result Value Ref Range   Glucose-Capillary 299 (*) 70 - 99 mg/dL     MDM   [name] is a 38 y.o. female with a PMH of gestational DM, lupus, HTN, cardiomegaly, anemia, heart murmur, and depression who presents to  the ED for evaluation of urinary frequency, blurred vision, and toe pain. Patient likely has new onset DM. Has hx of gestational DM. Patient had reduction in blood glucose with IV fluids and aspart insulin. Patient not in DKA. No UTI. Recent prednisone use may have exacerbated her DM. Patient started on Metformin. Has follow-up appointment scheduled next week. Toe pain possibly due to a contusion. Mild ecchymosis present. Doubt fracture. Offered x-ray however patient declined. Patient neurovascularly intact. No signs or symptoms of an infectious process. Patient afebrile and non-toxic in appearance. Vital signs stable. Patient able to ambulate  without difficulty or ataxia.    1:50 PM = Visual Acuity BB Visual Acuity - Bilateral Near: 20/20 ; R Near: 20/20 ; L Near: 20/20  3:00 PM = Patient states her blurry vision has almost resolved. No other concerns. No pain.    Discharge Medication List as of 12/25/2013  3:18 PM    START taking these medications   Details  metFORMIN (GLUCOPHAGE) 500 MG tablet Take 1 tablet (500 mg total) by mouth 2 (two) times daily with a meal., Starting 12/25/2013, Until Discontinued, Print         Final impressions: 1. Diabetes mellitus   2. Toe pain, right   3. Hypertension      Mercy Moore PA-C   This patient was discussed with Dr. Elicia Lamp, PA-C 12/27/13 [phone]

## 2013-12-25 NOTE — Discharge Instructions (Signed)
Take metformin 500 mg twice daily  Continue to take your BP medications as directed  Return to the emergency department if you develop any changing/worsening condition or any other concerns (please read additional information regarding your condition below)     Diabetes and Exercise Exercising regularly is important. It is not just about losing weight. It has many health benefits, such as:  Improving your overall fitness, flexibility, and endurance.  Increasing your bone density.  Helping with weight control.  Decreasing your body fat.  Increasing your muscle strength.  Reducing stress and tension.  Improving your overall health. People with diabetes who exercise gain additional benefits because exercise:  Reduces appetite.  Improves the body's use of blood sugar (glucose).  Helps lower or control blood glucose.  Decreases blood pressure.  Helps control blood lipids (such as cholesterol and triglycerides).  Improves the body's use of the hormone insulin by:  Increasing the body's insulin sensitivity.  Reducing the body's insulin needs.  Decreases the risk for heart disease because exercising:  Lowers cholesterol and triglycerides levels.  Increases the levels of good cholesterol (such as high-density lipoproteins [HDL]) in the body.  Lowers blood glucose levels. YOUR ACTIVITY PLAN  Choose an activity that you enjoy and set realistic goals. Your health care provider or diabetes educator can help you make an activity plan that works for you. You can break activities into 2 or 3 sessions throughout the day. Doing so is as good as one long session. Exercise ideas include:  Taking the dog for a walk.  Taking the stairs instead of the elevator.  Dancing to your favorite song.  Doing your favorite exercise with a friend. RECOMMENDATIONS FOR EXERCISING WITH TYPE 1 OR TYPE 2 DIABETES   Check your blood glucose before exercising. If blood glucose levels are greater  than 240 mg/dL, check for urine ketones. Do not exercise if ketones are present.  Avoid injecting insulin into areas of the body that are going to be exercised. For example, avoid injecting insulin into:  The arms when playing tennis.  The legs when jogging.  Keep a record of:  Food intake before and after you exercise.  Expected peak times of insulin action.  Blood glucose levels before and after you exercise.  The type and amount of exercise you have done.  Review your records with your health care provider. Your health care provider will help you to develop guidelines for adjusting food intake and insulin amounts before and after exercising.  If you take insulin or oral hypoglycemic agents, watch for signs and symptoms of hypoglycemia. They include:  Dizziness.  Shaking.  Sweating.  Chills.  Confusion.  Drink plenty of water while you exercise to prevent dehydration or heat stroke. Body water is lost during exercise and must be replaced.  Talk to your health care provider before starting an exercise program to make sure it is safe for you. Remember, almost any type of activity is better than none. Document Released: 10/20/2003 Document Revised: 04/01/2013 Document Reviewed: 01/06/2013 [facility] Patient Information 2014 [place].  Type 2 Diabetes Mellitus, Adult Type 2 diabetes mellitus, often simply referred to as type 2 diabetes, is a long-lasting (chronic) disease. In type 2 diabetes, the pancreas does not make enough insulin (a hormone), the cells are less responsive to the insulin that is made (insulin resistance), or both. Normally, insulin moves sugars from food into the tissue cells. The tissue cells use the sugars for energy. The lack of insulin or the lack  of normal response to insulin causes excess sugars to build up in the blood instead of going into the tissue cells. As a result, high blood sugar (hyperglycemia) develops. The effect of high sugar (glucose)  levels can cause many complications. Type 2 diabetes was also previously called adult-onset diabetes but it can occur at any age.  RISK FACTORS  A person is predisposed to developing type 2 diabetes if someone in the family has the disease and also has one or more of the following primary risk factors:  Overweight.  An inactive lifestyle.  A history of consistently eating high-calorie foods. Maintaining a normal weight and regular physical activity can reduce the chance of developing type 2 diabetes. SYMPTOMS  A person with type 2 diabetes may not show symptoms initially. The symptoms of type 2 diabetes appear slowly. The symptoms include:  Increased thirst (polydipsia).  Increased urination (polyuria).  Increased urination during the night (nocturia).  Weight loss. This weight loss may be rapid.  Frequent, recurring infections.  Tiredness (fatigue).  Weakness.  Vision changes, such as blurred vision.  Fruity smell to your breath.  Abdominal pain.  Nausea or vomiting.  Cuts or bruises which are slow to heal.  Tingling or numbness in the hands or feet. DIAGNOSIS Type 2 diabetes is frequently not diagnosed until complications of diabetes are present. Type 2 diabetes is diagnosed when symptoms or complications are present and when blood glucose levels are increased. Your blood glucose level may be checked by one or more of the following blood tests:  A fasting blood glucose test. You will not be allowed to eat for at least 8 hours before a blood sample is taken.  A random blood glucose test. Your blood glucose is checked at any time of the day regardless of when you ate.  A hemoglobin A1c blood glucose test. A hemoglobin A1c test provides information about blood glucose control over the previous 3 months.  An oral glucose tolerance test (OGTT). Your blood glucose is measured after you have not eaten (fasted) for 2 hours and then after you drink a glucose-containing  beverage. TREATMENT   You may need to take insulin or diabetes medicine daily to keep blood glucose levels in the desired range.  You will need to match insulin dosing with exercise and healthy food choices. The treatment goal is to maintain the before meal blood sugar (preprandial glucose) level at 70 130 mg/dL. HOME CARE INSTRUCTIONS   Have your hemoglobin A1c level checked twice a year.  Perform daily blood glucose monitoring as directed by your caregiver.  Monitor urine ketones when you are ill and as directed by your caregiver.  Take your diabetes medicine or insulin as directed by your caregiver to maintain your blood glucose levels in the desired range.  Never run out of diabetes medicine or insulin. It is needed every day.  Adjust insulin based on your intake of carbohydrates. Carbohydrates can raise blood glucose levels but need to be included in your diet. Carbohydrates provide vitamins, minerals, and fiber which are an essential part of a healthy diet. Carbohydrates are found in fruits, vegetables, whole grains, dairy products, legumes, and foods containing added sugars.    Eat healthy foods. Alternate 3 meals with 3 snacks.  Lose weight if overweight.  Carry a medical alert card or wear your medical alert jewelry.  Carry a 15 gram carbohydrate snack with you at all times to treat low blood glucose (hypoglycemia). Some examples of 15 gram carbohydrate snacks include:  Glucose tablets, 3 or 4   Glucose gel, 15 gram tube  Raisins, 2 tablespoons (24 grams)  Jelly beans, 6  Animal crackers, 8  Regular pop, 4 ounces (120 mL)  Gummy treats, 9  Recognize hypoglycemia. Hypoglycemia occurs with blood glucose levels of 70 mg/dL and below. The risk for hypoglycemia increases when fasting or skipping meals, during or after intense exercise, and during sleep. Hypoglycemia symptoms can include:  Tremors or shakes.  Decreased ability to  concentrate.  Sweating.  Increased heart rate.  Headache.  Dry mouth.  Hunger.  Irritability.  Anxiety.  Restless sleep.  Altered speech or coordination.  Confusion.  Treat hypoglycemia promptly. If you are alert and able to safely swallow, follow the 15:15 rule:  Take 15 20 grams of rapid-acting glucose or carbohydrate. Rapid-acting options include glucose gel, glucose tablets, or 4 ounces (120 mL) of fruit juice, regular soda, or low fat milk.  Check your blood glucose level 15 minutes after taking the glucose.  Take 15 20 grams more of glucose if the repeat blood glucose level is still 70 mg/dL or below.  Eat a meal or snack within 1 hour once blood glucose levels return to normal.    Be alert to polyuria and polydipsia which are early signs of hyperglycemia. An early awareness of hyperglycemia allows for prompt treatment. Treat hyperglycemia as directed by your caregiver.  Engage in at least 150 minutes of moderate-intensity physical activity a week, spread over at least 3 days of the week or as directed by your caregiver. In addition, you should engage in resistance exercise at least 2 times a week or as directed by your caregiver.  Adjust your medicine and food intake as needed if you start a new exercise or sport.  Follow your sick day plan at any time you are unable to eat or drink as usual.  Avoid tobacco use.  Limit alcohol intake to no more than 1 drink per day for nonpregnant women and 2 drinks per day for men. You should drink alcohol only when you are also eating food. Talk with your caregiver whether alcohol is safe for you. Tell your caregiver if you drink alcohol several times a week.  Follow up with your caregiver regularly.  Schedule an eye exam soon after the diagnosis of type 2 diabetes and then annually.  Perform daily skin and foot care. Examine your skin and feet daily for cuts, bruises, redness, nail problems, bleeding, blisters, or sores.  A foot exam by a caregiver should be done annually.  Brush your teeth and gums at least twice a day and floss at least once a day. Follow up with your dentist regularly.  Share your diabetes management plan with your workplace or school.  Stay up-to-date with immunizations.  Learn to manage stress.  Obtain ongoing diabetes education and support as needed.  Participate in, or seek rehabilitation as needed to maintain or improve independence and quality of life. Request a physical or occupational therapy referral if you are having foot or hand numbness or difficulties with grooming, dressing, eating, or physical activity. SEEK MEDICAL CARE IF:   You are unable to eat food or drink fluids for more than 6 hours.  You have nausea and vomiting for more than 6 hours.  Your blood glucose level is over 240 mg/dL.  There is a change in mental status.  You develop an additional serious illness.  You have diarrhea for more than 6 hours.  You have been sick or  have had a fever for a couple of days and are not getting better.  You have pain during any physical activity.  SEEK IMMEDIATE MEDICAL CARE IF:  You have difficulty breathing.  You have moderate to large ketone levels. MAKE SURE YOU:  Understand these instructions.  Will watch your condition.  Will get help right away if you are not doing well or get worse. Document Released: 07/30/2005 Document Revised: 04/23/2012 Document Reviewed: 02/26/2012 [facility] Patient Information 2014 [place].  Metformin tablets What is this medicine? METFORMIN (met FOR min) is used to treat type 2 diabetes. It helps to control blood sugar. Treatment is combined with diet and exercise. This medicine can be used alone or with other medicines for diabetes. This medicine may be used for other purposes; ask your health care provider or pharmacist if you have questions. COMMON BRAND NAME(S): Glucophage What should I tell my health care  provider before I take this medicine? They need to know if you have any of these conditions: -anemia -frequently drink alcohol-containing beverages -become easily dehydrated -heart attack -heart failure that is treated with medications -kidney disease -liver disease -polycystic ovary syndrome -serious infection or injury -vomiting -an unusual or allergic reaction to metformin, other medicines, foods, dyes, or preservatives -pregnant or trying to get pregnant -breast-feeding How should I use this medicine? Take this medicine by mouth. Take it with meals. Swallow the tablets with a drink of water. Follow the directions on the prescription label. Take your medicine at regular intervals. Do not take your medicine more often than directed. Talk to your pediatrician regarding the use of this medicine in children. While this drug may be prescribed for children as young as 70 years of age for selected conditions, precautions do apply. Overdosage: If you think you have taken too much of this medicine contact a poison control center or emergency room at once. NOTE: This medicine is only for you. Do not share this medicine with others. What if I miss a dose? If you miss a dose, take it as soon as you can. If it is almost time for your next dose, take only that dose. Do not take double or extra doses. What may interact with this medicine? Do not take this medicine with any of the following medications: -dofetilide -gatifloxacin -certain contrast medicines given before X-rays, CT scans, MRI, or other procedures This medicine may also interact with the following medications: -digoxin -diuretics -female hormones, like estrogens or progestins and birth control pills -isoniazid -medicines for blood pressure, heart disease, irregular heart beat -morphine -nicotinic acid -phenothiazines like chlorpromazine, mesoridazine, prochlorperazine,  thioridazine -phenytoin -procainamide -quinidine -quinine -ranitidine -steroid medicines like prednisone or cortisone -stimulant medicines for attention disorders, weight loss, or to stay awake -thyroid medicines -trimethoprim -vancomycin This list may not describe all possible interactions. Give your health care provider a list of all the medicines, herbs, non-prescription drugs, or dietary supplements you use. Also tell them if you smoke, drink alcohol, or use illegal drugs. Some items may interact with your medicine. What should I watch for while using this medicine? Visit your doctor or health care professional for regular checks on your progress. A test called the HbA1C (A1C) will be monitored. This is a simple blood test. It measures your blood sugar control over the last 2 to 3 months. You will receive this test every 3 to 6 months. Learn how to check your blood sugar. Learn the symptoms of low and high blood sugar and how to manage them.  Always carry a quick-source of sugar with you in case you have symptoms of low blood sugar. Examples include hard sugar candy or glucose tablets. Make sure others know that you can choke if you eat or drink when you develop serious symptoms of low blood sugar, such as seizures or unconsciousness. They must get medical help at once. Tell your doctor or health care professional if you have high blood sugar. You might need to change the dose of your medicine. If you are sick or exercising more than usual, you might need to change the dose of your medicine. Do not skip meals. Ask your doctor or health care professional if you should avoid alcohol. Many nonprescription cough and cold products contain sugar or alcohol. These can affect blood sugar. This medicine may cause ovulation in premenopausal women who do not have regular monthly periods. This may increase your chances of becoming pregnant. You should not take this medicine if you become pregnant or think  you may be pregnant. Talk with your doctor or health care professional about your birth control options while taking this medicine. Contact your doctor or health care professional right away if think you are pregnant. If you are going to need surgery, a MRI, CT scan, or other procedure, tell your doctor that you are taking this medicine. You may need to stop taking this medicine before the procedure. Wear a medical ID bracelet or chain, and carry a card that describes your disease and details of your medicine and dosage times. What side effects may I notice from receiving this medicine? Side effects that you should report to your doctor or health care professional as soon as possible: -allergic reactions like skin rash, itching or hives, swelling of the face, lips, or tongue -breathing problems -feeling faint or lightheaded, falls -muscle aches or pains -signs and symptoms of low blood sugar such as feeling anxious, confusion, dizziness, increased hunger, unusually weak or tired, sweating, shakiness, cold, irritable, headache, blurred vision, fast heartbeat, loss of consciousness -slow or irregular heartbeat -unusual stomach pain or discomfort -unusually tired or weak Side effects that usually do not require medical attention (report to your doctor or health care professional if they continue or are bothersome): -diarrhea -headache -heartburn -metallic taste in mouth -nausea -stomach gas, upset This list may not describe all possible side effects. Call your doctor for medical advice about side effects. You may report side effects to FDA at 1-800-FDA-1088. Where should I keep my medicine? Keep out of the reach of children. Store at room temperature between 15 and 30 degrees C (59 and 86 degrees F). Protect from moisture and light. Throw away any unused medicine after the expiration date. NOTE: This sheet is a summary. It may not cover all possible information. If you have questions about this  medicine, talk to your doctor, pharmacist, or health care provider.  2014, [employer]. (2012-11-11 16:03:44)  Hypertension As your heart beats, it forces blood through your arteries. This force is your blood pressure. If the pressure is too high, it is called hypertension (HTN) or high blood pressure. HTN is dangerous because you may have it and not know it. High blood pressure may mean that your heart has to work harder to pump blood. Your arteries may be narrow or stiff. The extra work puts you at risk for heart disease, stroke, and other problems.  Blood pressure consists of two numbers, a higher number over a lower, 110/72, for example. It is stated as "110 over 72."  The ideal is below 120 for the top number (systolic) and under 80 for the bottom (diastolic). Write down your blood pressure today. You should pay close attention to your blood pressure if you have certain conditions such as:  Heart failure.  Prior heart attack.  Diabetes  Chronic kidney disease.  Prior stroke.  Multiple risk factors for heart disease. To see if you have HTN, your blood pressure should be measured while you are seated with your arm held at the level of the heart. It should be measured at least twice. A one-time elevated blood pressure reading (especially in the Emergency Department) does not mean that you need treatment. There may be conditions in which the blood pressure is different between your right and left arms. It is important to see your caregiver soon for a recheck. Most people have essential hypertension which means that there is not a specific cause. This type of high blood pressure may be lowered by changing lifestyle factors such as:  Stress.  Smoking.  Lack of exercise.  Excessive weight.  Drug/tobacco/alcohol use.  Eating less salt. Most people do not have symptoms from high blood pressure until it has caused damage to the body. Effective treatment can often prevent, delay  or reduce that damage. TREATMENT  When a cause has been identified, treatment for high blood pressure is directed at the cause. There are a large number of medications to treat HTN. These fall into several categories, and your caregiver will help you select the medicines that are best for you. Medications may have side effects. You should review side effects with your caregiver. If your blood pressure stays high after you have made lifestyle changes or started on medicines,   Your medication(s) may need to be changed.  Other problems may need to be addressed.  Be certain you understand your prescriptions, and know how and when to take your medicine.  Be sure to follow up with your caregiver within the time frame advised (usually within two weeks) to have your blood pressure rechecked and to review your medications.  If you are taking more than one medicine to lower your blood pressure, make sure you know how and at what times they should be taken. Taking two medicines at the same time can result in blood pressure that is too low. SEEK IMMEDIATE MEDICAL CARE IF:  You develop a severe headache, blurred or changing vision, or confusion.  You have unusual weakness or numbness, or a faint feeling.  You have severe chest or abdominal pain, vomiting, or breathing problems. MAKE SURE YOU:   Understand these instructions.  Will watch your condition.  Will get help right away if you are not doing well or get worse. Document Released: 07/30/2005 Document Revised: 10/22/2011 Document Reviewed: 03/19/2008 [facility] Patient Information 2014 [place].

## 2013-12-27 ENCOUNTER — Inpatient Hospital Stay (HOSPITAL_COMMUNITY)
Admission: AD | Admit: 2013-12-27 | Discharge: 2013-12-27 | Disposition: A | Discharge status: Home / Self Care | Payer: Self-pay | Source: Ambulatory Visit | Attending: Obstetrics and Gynecology | Admitting: Obstetrics and Gynecology

## 2013-12-27 ENCOUNTER — Encounter (HOSPITAL_COMMUNITY): Payer: Self-pay

## 2013-12-27 DIAGNOSIS — A499 Bacterial infection, unspecified: Secondary | ICD-10-CM | POA: Insufficient documentation

## 2013-12-27 DIAGNOSIS — N760 Acute vaginitis: Secondary | ICD-10-CM | POA: Insufficient documentation

## 2013-12-27 DIAGNOSIS — B3731 Acute candidiasis of vulva and vagina: Secondary | ICD-10-CM | POA: Insufficient documentation

## 2013-12-27 DIAGNOSIS — I10 Essential (primary) hypertension: Secondary | ICD-10-CM | POA: Insufficient documentation

## 2013-12-27 DIAGNOSIS — B373 Candidiasis of vulva and vagina: Secondary | ICD-10-CM

## 2013-12-27 DIAGNOSIS — M329 Systemic lupus erythematosus, unspecified: Secondary | ICD-10-CM | POA: Insufficient documentation

## 2013-12-27 DIAGNOSIS — E119 Type 2 diabetes mellitus without complications: Secondary | ICD-10-CM | POA: Insufficient documentation

## 2013-12-27 DIAGNOSIS — B9689 Other specified bacterial agents as the cause of diseases classified elsewhere: Secondary | ICD-10-CM | POA: Insufficient documentation

## 2013-12-27 DIAGNOSIS — R071 Chest pain on breathing: Secondary | ICD-10-CM | POA: Insufficient documentation

## 2013-12-27 LAB — URINALYSIS, ROUTINE W REFLEX MICROSCOPIC
Glucose, UA: 500 mg/dL — AB
Ketones, ur: 40 mg/dL — AB
Leukocytes, UA: NEGATIVE
Nitrite: NEGATIVE
Protein, ur: 30 mg/dL — AB
Specific Gravity, Urine: 1.02 (ref 1.005–1.030)
Urobilinogen, UA: 0.2 mg/dL (ref 0.0–1.0)
pH: 6 (ref 5.0–8.0)

## 2013-12-27 LAB — URINE MICROSCOPIC-ADD ON

## 2013-12-27 LAB — POCT PREGNANCY, URINE: Preg Test, Ur: NEGATIVE

## 2013-12-27 LAB — WET PREP, GENITAL
Trich, Wet Prep: NONE SEEN
Yeast Wet Prep HPF POC: NONE SEEN

## 2013-12-27 MED ORDER — FLUCONAZOLE 150 MG PO TABS: 150.0000 mg | ORAL_TABLET | ORAL | Status: DC

## 2013-12-27 MED ORDER — CEPHALEXIN 500 MG PO CAPS: 500.0000 mg | ORAL_CAPSULE | Freq: Four times a day (QID) | ORAL | Status: DC

## 2013-12-27 MED ORDER — METRONIDAZOLE 500 MG PO TABS: 500.0000 mg | ORAL_TABLET | Freq: Two times a day (BID) | ORAL | Status: DC

## 2013-12-27 NOTE — MAU Note (Signed)
Pt states newly diagnosed with DM last week in ED. Took monistat for yeast infection which typically helps but didn't this time. Has been voiding q10-15 minutes. Had calmed down some since getting on new medications however still is persistent. Also has a knot on top of pubic mound. Tender to the touch. Noted since yesterday.

## 2013-12-27 NOTE — Discharge Instructions (Signed)
P: Keflex 500 mg 4x day for 10 days Culture done Hot soaks continuously Diflucan 150 mg qod x 3 doses  Flagyl 500 mg x 7 days/ may want to hold for few days [facility] Tuesday at 1 pm Need to see PCP for Diabetes control asap Return to MAU as needed  Bacterial Vaginosis Bacterial vaginosis is an infection of the vagina. It happens when too many of certain germs (bacteria) grow in the vagina. HOME CARE  Take your medicine as told by your doctor.  Finish your medicine even if you start to feel better.  Do not have sex until you finish your medicine and are better.  Tell your sex partner that you have an infection. They should see their doctor for treatment.  Practice safe sex. Use condoms. Have only one sex partner. GET HELP IF:  You are not getting better after 3 days of treatment.  You have more grey fluid (discharge) coming from your vagina than before.  You have more pain than before.  You have a fever. MAKE SURE YOU:   Understand these instructions.  Will watch your condition.  Will get help right away if you are not doing well or get worse. Document Released: 05/08/2008 Document Revised: 05/20/2013 Document Reviewed: 03/11/2013 [facility] Patient Information 2014 [place].  Abscess An abscess is an infected area that contains a collection of pus and debris.It can occur in almost any part of the body. An abscess is also known as a furuncle or boil. CAUSES  An abscess occurs when tissue gets infected. This can occur from blockage of oil or sweat glands, infection of hair follicles, or a minor injury to the skin. As the body tries to fight the infection, pus collects in the area and creates pressure under the skin. This pressure causes pain. People with weakened immune systems have difficulty fighting infections and get certain abscesses more often.  SYMPTOMS Usually an abscess develops on the skin and becomes a painful mass that is red, warm, and  tender. If the abscess forms under the skin, you may feel a moveable soft area under the skin. Some abscesses break open (rupture) on their own, but most will continue to get worse without care. The infection can spread deeper into the body and eventually into the bloodstream, causing you to feel ill.  DIAGNOSIS  Your caregiver will take your medical history and perform a physical exam. A sample of fluid may also be taken from the abscess to determine what is causing your infection. TREATMENT  Your caregiver may prescribe antibiotic medicines to fight the infection. However, taking antibiotics alone usually does not cure an abscess. Your caregiver may need to make a small cut (incision) in the abscess to drain the pus. In some cases, gauze is packed into the abscess to reduce pain and to continue draining the area. HOME CARE INSTRUCTIONS   Only take over-the-counter or prescription medicines for pain, discomfort, or fever as directed by your caregiver.  If you were prescribed antibiotics, take them as directed. Finish them even if you start to feel better.  If gauze is used, follow your caregiver's directions for changing the gauze.  To avoid spreading the infection:  Keep your draining abscess covered with a bandage.  Wash your hands well.  Do not share personal care items, towels, or whirlpools with others.  Avoid skin contact with others.  Keep your skin and clothes clean around the abscess.  Keep all follow-up appointments as directed by your  caregiver. SEEK MEDICAL CARE IF:   You have increased pain, swelling, redness, fluid drainage, or bleeding.  You have muscle aches, chills, or a general ill feeling.  You have a fever. MAKE SURE YOU:   Understand these instructions.  Will watch your condition.  Will get help right away if you are not doing well or get worse. Document Released: 05/09/2005 Document Revised: 01/29/2012 Document Reviewed: 10/12/2011 [facility] Patient  Information 2014 [place]. Monilial Vaginitis Vaginitis in a soreness, swelling and redness (inflammation) of the vagina and vulva. Monilial vaginitis is not a sexually transmitted infection. CAUSES  Yeast vaginitis is caused by yeast (candida) that is normally found in your vagina. With a yeast infection, the candida has overgrown in number to a point that upsets the chemical balance. SYMPTOMS   White, thick vaginal discharge.  Swelling, itching, redness and irritation of the vagina and possibly the lips of the vagina (vulva).  Burning or painful urination.  Painful intercourse. DIAGNOSIS  Things that may contribute to monilial vaginitis are:  Postmenopausal and virginal states.  Pregnancy.  Infections.  Being tired, sick or stressed, especially if you had monilial vaginitis in the past.  Diabetes. Good control will help lower the chance.  Birth control pills.  Tight fitting garments.  Using bubble bath, feminine sprays, douches or deodorant tampons.  Taking certain medications that kill germs (antibiotics).  Sporadic recurrence can occur if you become ill. TREATMENT  Your caregiver will give you medication.  There are several kinds of anti monilial vaginal creams and suppositories specific for monilial vaginitis. For recurrent yeast infections, use a suppository or cream in the vagina 2 times a week, or as directed.  Anti-monilial or steroid cream for the itching or irritation of the vulva may also be used. Get your caregiver's permission.  Painting the vagina with methylene blue solution may help if the monilial cream does not work.  Eating yogurt may help prevent monilial vaginitis. HOME CARE INSTRUCTIONS   Finish all medication as prescribed.  Do not have sex until treatment is completed or after your caregiver tells you it is okay.  Take warm sitz baths.  Do not douche.  Do not use tampons, especially scented ones.  Wear cotton underwear.  Avoid  tight pants and panty hose.  Tell your sexual partner that you have a yeast infection. They should go to their caregiver if they have symptoms such as mild rash or itching.  Your sexual partner should be treated as well if your infection is difficult to eliminate.  Practice safer sex. Use condoms.  Some vaginal medications cause latex condoms to fail. Vaginal medications that harm condoms are:  Cleocin cream.  Butoconazole (Femstat).  Terconazole (Terazol) vaginal suppository.  Miconazole (Monistat) (may be purchased over the counter). SEEK MEDICAL CARE IF:   You have a temperature by mouth above 102 F (38.9 C).  The infection is getting worse after 2 days of treatment.  The infection is not getting better after 3 days of treatment.  You develop blisters in or around your vagina.  You develop vaginal bleeding, and it is not your menstrual period.  You have pain when you urinate.  You develop intestinal problems.  You have pain with sexual intercourse. Document Released: 05/09/2005 Document Revised: 10/22/2011 Document Reviewed: 01/21/2009 [facility] Patient Information 2014 [place], [place].

## 2013-12-27 NOTE — MAU Provider Note (Signed)
History     CSN: [id number]  Arrival date and time: 12/27/13 6237   First Provider Initiated Contact with Patient 12/27/13 [phone]      Chief Complaint  Patient presents with  . Vaginal Itching  . Cyst   HPI Comments: [name] 38 y.o. [id number] presents to MAU with 2 complaints, first is vaginal itching and discharge and second is infection on her [place]. She said symptoms have been ongoing x 7 days. She was seen at [facility] 2 days ago and diagnosed with Diabetes. She was placed on Metformin and also given prednisone for chest wall pain.  She has used OTC Monistat and that has not helped. She has been taking hot soaks and the infection has drained a little. She is having her menses.     Past Medical History  Diagnosis Date  . Anemia   . Enlarged heart     managed by cardiology  . Hypertension   . Lupus 1999  . Allergy   . Gestational diabetes   . Heart murmur   . Infection     UTI  . Depression     after losses  . Fibroid   . Vaginal Pap smear, abnormal     colpo, HPV    Past Surgical History  Procedure Laterality Date  . No past surgeries    . Therapeutic abortion      Family History  Problem Relation Age of Onset  . Diabetes Mother   . Diabetes Maternal Uncle   . Hypertension Maternal Grandmother   . Diabetes Father   . Cancer Maternal Grandfather     lung  . Hearing loss Neg Hx     History  Substance Use Topics  . Smoking status: Never Smoker   . Smokeless tobacco: Never Used  . Alcohol Use: Yes     Comment: rarely    Allergies:  Allergies  Allergen Reactions  . Septra [Bactrim] Hives    Prescriptions prior to admission  Medication Sig Dispense Refill  . amLODipine (NORVASC) 10 MG tablet Take 1 tablet (10 mg total) by mouth daily.  30 tablet  0  . cloNIDine (CATAPRES) 0.2 MG tablet Take 0.2 mg by mouth 2 (two) times daily.      . hydroxychloroquine (PLAQUENIL) 200 MG tablet Take 200 mg by mouth 2 (two) times daily.      . metFORMIN  (GLUCOPHAGE) 500 MG tablet Take 1 tablet (500 mg total) by mouth 2 (two) times daily with a meal.  60 tablet  1  . medroxyPROGESTERone (DEPO-PROVERA) 150 MG/ML injection Inject 150 mg into the muscle every 3 (three) months.        Review of Systems  Constitutional: Negative.   HENT: Negative.   Eyes: Negative.   Respiratory: Negative.   Cardiovascular: Negative.   Gastrointestinal: Negative.   Genitourinary:       Vaginal itching and infection on skin  Musculoskeletal: Negative.   Skin: Negative.   Neurological: Negative.   Psychiatric/Behavioral: Negative.    Physical Exam   Blood pressure 144/100, pulse 104, temperature 98.2 F (36.8 C), temperature source Oral, resp. rate 16, height 5' (1.524 m), weight 99.338 kg (219 lb), last menstrual period 12/27/2013.  Physical Exam  Constitutional: She is oriented to person, place, and time. She appears well-developed and well-nourished. No distress.  HENT:  Head: Normocephalic and atraumatic.  Eyes: Pupils are equal, round, and reactive to light.  GI: Soft. Bowel sounds are normal. She exhibits no distension and  no mass. There is no tenderness. There is no rebound and no guarding.  Genitourinary:  Genital:External Mons right side the is 5 cm hard abscess that is draining purulent material. The labia are red and irritated looking Vaginal:Small amount blood Cervix:closed/ thick/ no lesions Bimanual:negative   Musculoskeletal: Normal range of motion.  Neurological: She is alert and oriented to person, place, and time.  Skin: Skin is warm and dry.  Psychiatric: She has a normal mood and affect. Her behavior is normal. Judgment and thought content normal.   Results for orders placed during the hospital encounter of 12/27/13 (from the past 24 hour(s))  URINALYSIS, ROUTINE W REFLEX MICROSCOPIC     Status: Abnormal   Collection Time    12/27/13  8:31 AM      Result Value Ref Range   Color, Urine YELLOW  YELLOW   APPearance CLEAR  CLEAR    Specific Gravity, Urine 1.020  1.005 - 1.030   pH 6.0  5.0 - 8.0   Glucose, UA 500 (*) NEGATIVE mg/dL   Hgb urine dipstick LARGE (*) NEGATIVE   Bilirubin Urine SMALL (*) NEGATIVE   Ketones, ur 40 (*) NEGATIVE mg/dL   Protein, ur 30 (*) NEGATIVE mg/dL   Urobilinogen, UA 0.2  0.0 - 1.0 mg/dL   Nitrite NEGATIVE  NEGATIVE   Leukocytes, UA NEGATIVE  NEGATIVE  URINE MICROSCOPIC-ADD ON     Status: Abnormal   Collection Time    12/27/13  8:31 AM      Result Value Ref Range   Squamous Epithelial / LPF FEW (*) RARE   WBC, UA 3-6  <3 WBC/hpf   RBC / HPF 3-6  <3 RBC/hpf   Bacteria, UA RARE  RARE   Urine-Other MUCOUS PRESENT    POCT PREGNANCY, URINE     Status: None   Collection Time    12/27/13  8:43 AM      Result Value Ref Range   Preg Test, Ur NEGATIVE  NEGATIVE  WET PREP, GENITAL     Status: Abnormal   Collection Time    12/27/13  9:15 AM      Result Value Ref Range   Yeast Wet Prep HPF POC NONE SEEN  NONE SEEN   Trich, Wet Prep NONE SEEN  NONE SEEN   Clue Cells Wet Prep HPF POC MANY (*) NONE SEEN   WBC, Wet Prep HPF POC FEW (*) NONE SEEN    MAU Course  Procedures  MDM Wet Prep, GC, Chlamydia  Assessment and Plan   A: Vaginal abscess Bacterial vaginosis Presumptive vaginal Yeast Diabetes  P: Keflex 500 mg 4x day for 10 days Culture done Hot soaks continuously Diflucan 150 mg qod x 3 doses  Flagyl 500 mg x 7 days/ may want to hold for few days [facility]ment [facility] at 1 pm Need to see PCP for Diabetes control asap Return to MAU as needed     [place] 12/27/2013, 9:21 AM

## 2013-12-28 LAB — GC/CHLAMYDIA PROBE AMP
CT Probe RNA: NEGATIVE
GC Probe RNA: NEGATIVE

## 2013-12-28 NOTE — ED Provider Notes (Signed)
Medical screening examination/treatment/procedure(s) were performed by non-physician practitioner and as supervising physician I was immediately available for consultation/collaboration.   EKG Interpretation None       Orlie Dakin, MD 12/28/13 [phone]

## 2013-12-28 NOTE — MAU Provider Note (Signed)
Attestation of Attending Supervision of Advanced Practitioner (CNM/NP): Evaluation and management procedures were performed by the Advanced Practitioner under my supervision and collaboration.  I have reviewed the Advanced Practitioner's note and chart, and I agree with the management and plan.  Jolea Dolle 12/28/2013 12:30 AM

## 2013-12-29 ENCOUNTER — Ambulatory Visit: Payer: Self-pay | Admitting: Family Medicine

## 2013-12-29 ENCOUNTER — Telehealth: Payer: Self-pay

## 2013-12-29 NOTE — Telephone Encounter (Signed)
Patient missed today's MAU f/u appointment. Appointment rescheduled to 12/30/13 at 1300. Called pt. No answer. Left message informing patient of new appointment and added to call clinic in the morning if she is unable to make appointment.

## 2013-12-30 ENCOUNTER — Encounter: Payer: Self-pay | Admitting: Obstetrics & Gynecology

## 2013-12-30 ENCOUNTER — Encounter: Payer: Self-pay | Admitting: *Deleted

## 2013-12-30 NOTE — Progress Notes (Signed)
Patient did not show for appointment today. Letter sent to patient.

## 2014-01-01 LAB — WOUND CULTURE
Gram Stain: NONE SEEN
Special Requests: NORMAL

## 2014-01-08 ENCOUNTER — Encounter: Payer: Self-pay | Admitting: General Practice

## 2014-02-08 ENCOUNTER — Encounter: Payer: Self-pay | Admitting: General Practice

## 2014-02-10 ENCOUNTER — Ambulatory Visit (INDEPENDENT_AMBULATORY_CARE_PROVIDER_SITE_OTHER): Payer: Self-pay | Admitting: *Deleted

## 2014-02-10 VITALS — BP 157/96

## 2014-02-10 DIAGNOSIS — N938 Other specified abnormal uterine and vaginal bleeding: Secondary | ICD-10-CM

## 2014-02-10 DIAGNOSIS — N925 Other specified irregular menstruation: Secondary | ICD-10-CM

## 2014-02-10 DIAGNOSIS — N949 Unspecified condition associated with female genital organs and menstrual cycle: Secondary | ICD-10-CM

## 2014-02-10 MED ORDER — MEDROXYPROGESTERONE ACETATE 150 MG/ML IM SUSP
150.0000 mg | Freq: Once | INTRAMUSCULAR | Status: AC
  Administered 2014-02-10: 150 mg via INTRAMUSCULAR

## 2014-02-10 NOTE — Progress Notes (Signed)
States took both bp meds after lunch today. Discussed her c/o depo not working well. This is 3rd shot. States after last shot no bleeding about 1 month then bleeding since then- off and on, some days light, some heavy, some none. Discussed needs to make next appt to see provider to discuss other options. Discussed if bleeding too heavy come to MAU.

## 2014-06-14 ENCOUNTER — Encounter (HOSPITAL_COMMUNITY): Payer: Self-pay

## 2014-10-13 ENCOUNTER — Emergency Department (HOSPITAL_COMMUNITY)
Admission: EM | Admit: 2014-10-13 | Discharge: 2014-10-13 | Discharge status: Absent without leave | Payer: Medicaid Other | Source: Home / Self Care

## 2014-11-12 ENCOUNTER — Emergency Department (HOSPITAL_COMMUNITY)
Admission: EM | Admit: 2014-11-12 | Discharge: 2014-11-13 | Disposition: A | Discharge status: Home / Self Care | Payer: Medicaid Other | Attending: Emergency Medicine | Admitting: Emergency Medicine

## 2014-11-12 ENCOUNTER — Encounter (HOSPITAL_COMMUNITY): Payer: Self-pay | Admitting: *Deleted

## 2014-11-12 DIAGNOSIS — Z862 Personal history of diseases of the blood and blood-forming organs and certain disorders involving the immune mechanism: Secondary | ICD-10-CM | POA: Diagnosis not present

## 2014-11-12 DIAGNOSIS — M7989 Other specified soft tissue disorders: Secondary | ICD-10-CM | POA: Insufficient documentation

## 2014-11-12 DIAGNOSIS — Z3202 Encounter for pregnancy test, result negative: Secondary | ICD-10-CM | POA: Insufficient documentation

## 2014-11-12 DIAGNOSIS — R062 Wheezing: Secondary | ICD-10-CM | POA: Insufficient documentation

## 2014-11-12 DIAGNOSIS — Z8632 Personal history of gestational diabetes: Secondary | ICD-10-CM | POA: Insufficient documentation

## 2014-11-12 DIAGNOSIS — Z87440 Personal history of urinary (tract) infections: Secondary | ICD-10-CM | POA: Diagnosis not present

## 2014-11-12 DIAGNOSIS — R011 Cardiac murmur, unspecified: Secondary | ICD-10-CM | POA: Diagnosis not present

## 2014-11-12 DIAGNOSIS — I10 Essential (primary) hypertension: Secondary | ICD-10-CM | POA: Insufficient documentation

## 2014-11-12 DIAGNOSIS — Z8742 Personal history of other diseases of the female genital tract: Secondary | ICD-10-CM | POA: Diagnosis not present

## 2014-11-12 DIAGNOSIS — R350 Frequency of micturition: Secondary | ICD-10-CM | POA: Insufficient documentation

## 2014-11-12 DIAGNOSIS — Z79899 Other long term (current) drug therapy: Secondary | ICD-10-CM | POA: Diagnosis not present

## 2014-11-12 DIAGNOSIS — Z8659 Personal history of other mental and behavioral disorders: Secondary | ICD-10-CM | POA: Diagnosis not present

## 2014-11-12 LAB — CBC WITH DIFFERENTIAL/PLATELET
Basophils Absolute: 0 10*3/uL (ref 0.0–0.1)
Basophils Relative: 0 % (ref 0–1)
Eosinophils Absolute: 0.2 10*3/uL (ref 0.0–0.7)
Eosinophils Relative: 4 % (ref 0–5)
HCT: 29.9 % — ABNORMAL LOW (ref 36.0–46.0)
Hemoglobin: 9.7 g/dL — ABNORMAL LOW (ref 12.0–15.0)
Lymphocytes Relative: 38 % (ref 12–46)
Lymphs Abs: 1.4 10*3/uL (ref 0.7–4.0)
MCH: 23.4 pg — ABNORMAL LOW (ref 26.0–34.0)
MCHC: 32.4 g/dL (ref 30.0–36.0)
MCV: 72.2 fL — ABNORMAL LOW (ref 78.0–100.0)
Monocytes Absolute: 0.4 10*3/uL (ref 0.1–1.0)
Monocytes Relative: 10 % (ref 3–12)
Neutro Abs: 1.7 10*3/uL (ref 1.7–7.7)
Neutrophils Relative %: 48 % (ref 43–77)
Platelets: 300 10*3/uL (ref 150–400)
RBC: 4.14 MIL/uL (ref 3.87–5.11)
RDW: 16.7 % — ABNORMAL HIGH (ref 11.5–15.5)
WBC: 3.6 10*3/uL — ABNORMAL LOW (ref 4.0–10.5)

## 2014-11-12 LAB — URINALYSIS, ROUTINE W REFLEX MICROSCOPIC
Bilirubin Urine: NEGATIVE
Glucose, UA: NEGATIVE mg/dL
Hgb urine dipstick: NEGATIVE
Ketones, ur: NEGATIVE mg/dL
Leukocytes, UA: NEGATIVE
Nitrite: NEGATIVE
Protein, ur: NEGATIVE mg/dL
Specific Gravity, Urine: 1.009 (ref 1.005–1.030)
Urobilinogen, UA: 0.2 mg/dL (ref 0.0–1.0)
pH: 7 (ref 5.0–8.0)

## 2014-11-12 LAB — COMPREHENSIVE METABOLIC PANEL
ALT: 17 U/L (ref 0–35)
AST: 24 U/L (ref 0–37)
Albumin: 3.5 g/dL (ref 3.5–5.2)
Alkaline Phosphatase: 38 U/L — ABNORMAL LOW (ref 39–117)
Anion gap: 8 (ref 5–15)
BUN: 7 mg/dL (ref 6–23)
CO2: 24 mmol/L (ref 19–32)
Calcium: 8.9 mg/dL (ref 8.4–10.5)
Chloride: 106 mmol/L (ref 96–112)
Creatinine, Ser: 0.62 mg/dL (ref 0.50–1.10)
GFR calc Af Amer: >90 mL/min (ref >90)
GFR calc non Af Amer: >90 mL/min (ref >90)
Glucose, Bld: 110 mg/dL — ABNORMAL HIGH (ref 70–99)
Potassium: 3.3 mmol/L — ABNORMAL LOW (ref 3.5–5.1)
Sodium: 138 mmol/L (ref 135–145)
Total Bilirubin: 0.4 mg/dL (ref 0.3–1.2)
Total Protein: 7.1 g/dL (ref 6.0–8.3)

## 2014-11-12 LAB — POC URINE PREG, ED: Preg Test, Ur: NEGATIVE

## 2014-11-12 NOTE — ED Notes (Signed)
The pt has lupus and she has pain in her lt foot  Since yesterday no known injury.  She also c/o some minor wheezes.  No wheezes heard.  She reports that she has been voiding mire than usual.  lmp  March 16th

## 2014-11-12 NOTE — ED Provider Notes (Signed)
CSN: [id number]     Arrival date & time 11/12/14  2041 History   First MD Initiated Contact with Patient 11/12/14 2345     Chief Complaint  Patient presents with  . multiple complaints      (Consider location/radiation/quality/duration/timing/severity/associated sxs/prior Treatment) The history is provided by the patient. No language interpreter was used.  [name] is a 39 y/o F with PMHx of anemia, enlarged heart, HTN, lupus, DM, heart murmur, depression, fibroid presenting to the ED with left foot swelling that started 2 days ago - patient reported that there was swelling in her feet bilaterally, reported that the right foot decreased in size, but stated that the left appears to be swollen. Stated that she has been elevating the foot. Stated that there is a tightness and itching to the left foot - reported that she has been soaking her foot in epstein salt. Reported she has been feeling as if her left leg is more swollen. Patient reported that she has been having increased urinary frequency over the past couple of days. Stated that she has been going quit frequently. Stated that for the past 2 weeks she has been having wheezing as well - reported that she was seen by her PCP last week and given an albuterol inhaler that helped. Denied insect bite, drainage, bleeding, injuries, loss of sensation, numbness, tingling, changes to skin colored, travels, hemoptysis, dysuria, hematuria, blurred vision, sudden loss of vision, dizziness, chest pain, shortness of breath, difficulty breathing, cough, nasal congestion, shortness of breath upon exertion, headache, abdominal pain, nausea, vomiting, diarrhea, melena, hematochezia, fever, chills, back pain. PCP Dr. Alyson Ingles  Past Medical History  Diagnosis Date  . Anemia   . Enlarged heart     managed by cardiology  . Hypertension   . Lupus 1999  . Allergy   . Gestational diabetes   . Heart murmur   . Infection     UTI  . Depression     after  losses  . Fibroid   . Vaginal Pap smear, abnormal     colpo, HPV   Past Surgical History  Procedure Laterality Date  . No past surgeries    . Therapeutic abortion     Family History  Problem Relation Age of Onset  . Diabetes Mother   . Diabetes Maternal Uncle   . Hypertension Maternal Grandmother   . Diabetes Father   . Cancer Maternal Grandfather     lung  . Hearing loss Neg Hx    History  Substance Use Topics  . Smoking status: Never Smoker   . Smokeless tobacco: Never Used  . Alcohol Use: Yes     Comment: rarely   OB History    Gravida Para Term Preterm AB TAB SAB Ectopic Multiple Living   5 2 0 2 3 1 2 0 0 2      Review of Systems  Constitutional: Negative for fever and chills.  Eyes: Negative for visual disturbance.  Respiratory: Positive for wheezing. Negative for chest tightness and shortness of breath.   Cardiovascular: Negative for chest pain.  Gastrointestinal: Negative for nausea, vomiting, abdominal pain, diarrhea, constipation, blood in stool and anal bleeding.  Genitourinary: Positive for frequency. Negative for dysuria, urgency and hematuria.  Musculoskeletal: Negative for back pain, neck pain and neck stiffness.  Neurological: Negative for dizziness, weakness and headaches.      Allergies  Septra  Home Medications   Prior to Admission medications   Medication Sig Start Date End Date Taking?  Authorizing Provider  albuterol (PROVENTIL HFA;VENTOLIN HFA) 108 (90 BASE) MCG/ACT inhaler Inhale 2 puffs into the lungs every 6 (six) hours as needed for wheezing or shortness of breath.   Yes Historical Provider, MD  amLODipine (NORVASC) 10 MG tablet Take 1 tablet (10 mg total) by mouth daily. 03/31/12  Yes Clayton Bibles, PA-C  cloNIDine (CATAPRES) 0.3 MG tablet Take 0.3 mg by mouth 2 (two) times daily.   Yes Historical Provider, MD  hydroxychloroquine (PLAQUENIL) 200 MG tablet Take 200 mg by mouth 2 (two) times daily.   Yes Historical Provider, MD  metFORMIN  (GLUCOPHAGE) 500 MG tablet Take 1 tablet (500 mg total) by mouth 2 (two) times daily with a meal. 12/25/13  Yes Lucila [place], PA-C  cloNIDine (CATAPRES) 0.2 MG tablet Take 0.2 mg by mouth 2 (two) times daily.    Historical Provider, MD  medroxyPROGESTERone (DEPO-PROVERA) 150 MG/ML injection Inject 150 mg into the muscle every 3 (three) months.    Historical Provider, MD   BP 174/88 mmHg  Pulse 87  Temp(Src) 99.5 F (37.5 C) (Oral)  Resp 14  Ht 5' (1.524 m)  Wt 226 lb (102.513 kg)  BMI 44.14 kg/m2  SpO2 100%  LMP 10/27/2014 Physical Exam  Constitutional: She is oriented to person, place, and time. She appears well-developed and well-nourished. No distress.  HENT:  Head: Normocephalic and atraumatic.  Mouth/Throat: Oropharynx is clear and moist. No oropharyngeal exudate.  Eyes: Conjunctivae and EOM are normal. Pupils are equal, round, and reactive to light. Right eye exhibits no discharge. Left eye exhibits no discharge.  Neck: Normal range of motion. Neck supple. No tracheal deviation present.  Cardiovascular: Normal rate, regular rhythm and normal heart sounds.  Exam reveals no friction rub.   No murmur heard. Negative pitting edema noted to the lower extremities bilaterally   Pulmonary/Chest: Effort normal and breath sounds normal. No respiratory distress. She has no wheezes. She has no rales.  Abdominal:  Obese   Musculoskeletal: Normal range of motion.  Full ROM to upper and lower extremities without difficulty noted, negative ataxia noted.  Negative swelling, erythema, inflammation, lesions, sores, deformities, red streaks, ulcerations noted to the left foot. Patient is able to wiggle toes without difficulty. Negative pain upon palpation to the left foot. Full ROM to the ankle.   Lymphadenopathy:    She has no cervical adenopathy.  Neurological: She is alert and oriented to person, place, and time. No cranial nerve deficit. She exhibits normal muscle tone. Coordination normal.   Cranial nerves III-XII grossly intact Strength 5+/5+ to upper and lower extremities bilaterally with resistance applied, equal distribution noted Negative arm drift Fine motor skills intact  Skin: Skin is warm and dry. No rash noted. She is not diaphoretic. No erythema.  Psychiatric: She has a normal mood and affect. Her behavior is normal. Thought content normal.  Nursing note and vitals reviewed.   ED Course  Procedures (including critical care time)  Results for orders placed or performed during the hospital encounter of 11/12/14  CBC with Differential  Result Value Ref Range   WBC 3.6 (L) 4.0 - 10.5 K/uL   RBC 4.14 3.87 - 5.11 MIL/uL   Hemoglobin 9.7 (L) 12.0 - 15.0 g/dL   HCT 29.9 (L) 36.0 - 46.0 %   MCV 72.2 (L) 78.0 - 100.0 fL   MCH 23.4 (L) 26.0 - 34.0 pg   MCHC 32.4 30.0 - 36.0 g/dL   RDW 16.7 (H) 11.5 - 15.5 %   Platelets  300 150 - 400 K/uL   Neutrophils Relative % 48 43 - 77 %   Neutro Abs 1.7 1.7 - 7.7 K/uL   Lymphocytes Relative 38 12 - 46 %   Lymphs Abs 1.4 0.7 - 4.0 K/uL   Monocytes Relative 10 3 - 12 %   Monocytes Absolute 0.4 0.1 - 1.0 K/uL   Eosinophils Relative 4 0 - 5 %   Eosinophils Absolute 0.2 0.0 - 0.7 K/uL   Basophils Relative 0 0 - 1 %   Basophils Absolute 0.0 0.0 - 0.1 K/uL  Comprehensive metabolic panel  Result Value Ref Range   Sodium 138 135 - 145 mmol/L   Potassium 3.3 (L) 3.5 - 5.1 mmol/L   Chloride 106 96 - 112 mmol/L   CO2 24 19 - 32 mmol/L   Glucose, Bld 110 (H) 70 - 99 mg/dL   BUN 7 6 - 23 mg/dL   Creatinine, Ser 0.62 0.50 - 1.10 mg/dL   Calcium 8.9 8.4 - 10.5 mg/dL   Total Protein 7.1 6.0 - 8.3 g/dL   Albumin 3.5 3.5 - 5.2 g/dL   AST 24 0 - 37 U/L   ALT 17 0 - 35 U/L   Alkaline Phosphatase 38 (L) 39 - 117 U/L   Total Bilirubin 0.4 0.3 - 1.2 mg/dL   GFR calc non Af Amer >90 >90 mL/min   GFR calc Af Amer >90 >90 mL/min   Anion gap 8 5 - 15  Urinalysis, Routine w reflex microscopic  Result Value Ref Range   Color, Urine YELLOW  YELLOW   APPearance CLEAR CLEAR   Specific Gravity, Urine 1.009 1.005 - 1.030   pH 7.0 5.0 - 8.0   Glucose, UA NEGATIVE NEGATIVE mg/dL   Hgb urine dipstick NEGATIVE NEGATIVE   Bilirubin Urine NEGATIVE NEGATIVE   Ketones, ur NEGATIVE NEGATIVE mg/dL   Protein, ur NEGATIVE NEGATIVE mg/dL   Urobilinogen, UA 0.2 0.0 - 1.0 mg/dL   Nitrite NEGATIVE NEGATIVE   Leukocytes, UA NEGATIVE NEGATIVE  Brain natriuretic peptide  Result Value Ref Range   B Natriuretic Peptide 92.7 0.0 - 100.0 pg/mL  POC Urine Pregnancy, ED (do NOT order at [facility])  Result Value Ref Range   Preg Test, Ur NEGATIVE NEGATIVE    Labs Review Labs Reviewed  CBC WITH DIFFERENTIAL/PLATELET - Abnormal; Notable for the following:    WBC 3.6 (*)    Hemoglobin 9.7 (*)    HCT 29.9 (*)    MCV 72.2 (*)    MCH 23.4 (*)    RDW 16.7 (*)    All other components within normal limits  COMPREHENSIVE METABOLIC PANEL - Abnormal; Notable for the following:    Potassium 3.3 (*)    Glucose, Bld 110 (*)    Alkaline Phosphatase 38 (*)    All other components within normal limits  URINALYSIS, ROUTINE W REFLEX MICROSCOPIC  BRAIN NATRIURETIC PEPTIDE  POC URINE PREG, ED    Imaging Review Dg Chest 2 View  11/13/2014   CLINICAL DATA:  Left leg and foot edema, acute onset. Initial encounter.  EXAM: CHEST  2 VIEW  COMPARISON:  Chest radiograph performed 12/15/2013  FINDINGS: The lungs are well-aerated. Vascular congestion is noted, with mildly increased interstitial markings, possibly reflecting minimal interstitial edema. There is no evidence of pleural effusion or pneumothorax.  The heart is enlarged.  No acute osseous abnormalities are seen.  IMPRESSION: Vascular congestion and cardiomegaly, with mildly increased interstitial markings, possibly reflecting minimal interstitial edema.   Electronically  Signed   By: Garald Balding M.D.   On: 11/13/2014 01:24   Dg Foot Complete Left  11/13/2014   CLINICAL DATA:  Left lower extremity edema for 2 days   EXAM: LEFT FOOT - COMPLETE 3+ VIEW  COMPARISON:  01/08/2007  FINDINGS: There is no evidence of fracture or dislocation. There is no evidence of arthropathy or other focal bone abnormality, except for a tiny plantar calcaneal spur. Soft tissues are unremarkable.  IMPRESSION: Negative.   Electronically Signed   By: Andreas Newport M.D.   On: 11/13/2014 01:23     EKG Interpretation None      MDM   Final diagnoses:  Left leg swelling    Medications  Rivaroxaban (XARELTO) tablet 15 mg (not administered)    Filed Vitals:   11/12/14 2047 11/13/14 0224  BP: 192/87 174/88  Pulse: 94 87  Temp: 99.5 F (37.5 C)   TempSrc: Oral   Resp: 18 14  Height: 5' (1.524 m)   Weight: 226 lb (102.513 kg)   SpO2: 99% 100%   CBC noted mildly low white blood cell count of 3.6 - similar when compared to other lab values. Hemoglobin 9.7, hematocrit 29.9. CMP noted mildly low potassium 3.3. Glucose of 110 with negative elevated anion gap. BNP negative elevation. Urine pregnancy negative. Urinalysis negative for hemoglobin, nitrites, leukocytes-negative findings of infection. Plain film of left foot negative for acute osseous injury. Plain film of chest noted vascular congestion and cardiomegaly with mild increased interstitial markings reflecting interstitial edema. Negative findings of acute cardio pulmonary disease. Negative findings of DKA. Doubt septic joint or gout. Negative findings of UTI or pyelonephritis. negative findings of kidney issues. Negative findings of fluid overload. Negative findings of pneumonia. Negative wheezes upon auscultation. Negative signs of respiratory distress. Patient seen and assessed by attending physician, Dr. Peggye Pitt - reported that patient's left calf is approximately 1.5 cm wider than the right. As per physician recommended Xarelto 15 mg to be administered tonight and for patient to report back to the ED tomorrow for doppler of the LLE to be performed to rule out possible  blood clot. Patient stable, afebrile. Patient not septic appearing. Discharged patient. Discussed with patient to rest and stay hydrated. Discussed with patient to elevate leg. Discussed with patient to report back to the ED tomorrow for doppler to be performed. Discussed with patient to closely monitor symptoms and if symptoms are to worsen or change to report back to the ED - strict return instructions given.  Patient agreed to plan of care, understood, all questions answered.   Jamse Mead, PA-C 54/[id number]/86 [id number]  Delora Fuel, MD 50/93/26 [id number]

## 2014-11-13 ENCOUNTER — Ambulatory Visit (HOSPITAL_COMMUNITY)
Admission: RE | Admit: 2014-11-13 | Discharge: 2014-11-13 | Disposition: A | Discharge status: Home / Self Care | Payer: Medicaid Other | Source: Ambulatory Visit | Attending: Emergency Medicine | Admitting: Emergency Medicine

## 2014-11-13 ENCOUNTER — Emergency Department (HOSPITAL_COMMUNITY): Payer: Medicaid Other

## 2014-11-13 DIAGNOSIS — R609 Edema, unspecified: Secondary | ICD-10-CM | POA: Diagnosis not present

## 2014-11-13 LAB — BRAIN NATRIURETIC PEPTIDE: B Natriuretic Peptide: 92.7 pg/mL (ref 0.0–100.0)

## 2014-11-13 MED ORDER — RIVAROXABAN 15 MG PO TABS
15.0000 mg | ORAL_TABLET | Freq: Once | ORAL | Status: AC
  Administered 2014-11-13: 15 mg via ORAL
  Filled 2014-11-13: qty 1

## 2014-11-13 NOTE — Discharge Instructions (Signed)
Please report back to emergency department tomorrow for ultrasound of the lower extremities to be performed to rule out possible blood clot Please rest and stay hydrated Please avoid any physical strenuous activity Please continue to monitor symptoms closely and if symptoms are to worsen or change (fever greater than 101, chills, sweating, nausea, vomiting, chest pain, shortness of breathe, difficulty breathing, weakness, numbness, tingling, worsening or changes to pain pattern, fall, injury, inability keep food or fluids down, coughing up blood, feeling short of breath with walking, redness or red streaks running down the leg) please report back to the Emergency Department immediately.    Peripheral Edema You have swelling in your legs (peripheral edema). This swelling is due to excess accumulation of salt and water in your body. Edema may be a sign of heart, kidney or liver disease, or a side effect of a medication. It may also be due to problems in the leg veins. Elevating your legs and using special support stockings may be very helpful, if the cause of the swelling is due to poor venous circulation. Avoid long periods of standing, whatever the cause. Treatment of edema depends on identifying the cause. Chips, pretzels, pickles and other salty foods should be avoided. Restricting salt in your diet is almost always needed. Water pills (diuretics) are often used to remove the excess salt and water from your body via urine. These medicines prevent the kidney from reabsorbing sodium. This increases urine flow. Diuretic treatment may also result in lowering of potassium levels in your body. Potassium supplements may be needed if you have to use diuretics daily. Daily weights can help you keep track of your progress in clearing your edema. You should call your caregiver for follow up care as recommended. SEEK IMMEDIATE MEDICAL CARE IF:   You have increased swelling, pain, redness, or heat in your  legs.  You develop shortness of breath, especially when lying down.  You develop chest or abdominal pain, weakness, or fainting.  You have a fever. Document Released: 09/06/2004 Document Revised: 10/22/2011 Document Reviewed: 08/17/2009 [facility] Patient Information 2015 [place], [place]. This information is not intended to replace advice given to you by your health care provider. Make sure you discuss any questions you have with your health care provider.

## 2014-11-13 NOTE — Progress Notes (Signed)
VASCULAR LAB PRELIMINARY  PRELIMINARY  PRELIMINARY  PRELIMINARY  Bilateral lower extremity venous Dopplers completed.    Preliminary report:  There is no obvious evidence of DVT or SVT noted in the bilateral lower extremities.   Giulliana Mcroberts, RVT 11/13/2014, 1:29 PM

## 2015-01-07 ENCOUNTER — Ambulatory Visit: Payer: Medicaid Other | Admitting: Obstetrics & Gynecology

## 2015-01-20 ENCOUNTER — Encounter: Payer: Self-pay | Admitting: Obstetrics & Gynecology

## 2015-01-20 ENCOUNTER — Other Ambulatory Visit (HOSPITAL_COMMUNITY)
Admission: RE | Admit: 2015-01-20 | Discharge: 2015-01-20 | Disposition: A | Discharge status: Home / Self Care | Payer: Medicaid Other | Source: Ambulatory Visit | Attending: Obstetrics & Gynecology | Admitting: Obstetrics & Gynecology

## 2015-01-20 ENCOUNTER — Ambulatory Visit (INDEPENDENT_AMBULATORY_CARE_PROVIDER_SITE_OTHER): Payer: Medicaid Other | Admitting: Obstetrics & Gynecology

## 2015-01-20 VITALS — BP 156/98 | HR 101 | Temp 98.6°F | Ht 60.0 in | Wt 236.5 lb

## 2015-01-20 DIAGNOSIS — Z30013 Encounter for initial prescription of injectable contraceptive: Secondary | ICD-10-CM | POA: Diagnosis not present

## 2015-01-20 DIAGNOSIS — Z124 Encounter for screening for malignant neoplasm of cervix: Secondary | ICD-10-CM | POA: Diagnosis not present

## 2015-01-20 DIAGNOSIS — R87619 Unspecified abnormal cytological findings in specimens from cervix uteri: Secondary | ICD-10-CM | POA: Insufficient documentation

## 2015-01-20 DIAGNOSIS — R87612 Low grade squamous intraepithelial lesion on cytologic smear of cervix (LGSIL): Secondary | ICD-10-CM | POA: Insufficient documentation

## 2015-01-20 DIAGNOSIS — R87810 Cervical high risk human papillomavirus (HPV) DNA test positive: Secondary | ICD-10-CM | POA: Diagnosis present

## 2015-01-20 DIAGNOSIS — N920 Excessive and frequent menstruation with regular cycle: Secondary | ICD-10-CM | POA: Diagnosis not present

## 2015-01-20 DIAGNOSIS — Z01419 Encounter for gynecological examination (general) (routine) without abnormal findings: Secondary | ICD-10-CM

## 2015-01-20 DIAGNOSIS — Z118 Encounter for screening for other infectious and parasitic diseases: Secondary | ICD-10-CM

## 2015-01-20 DIAGNOSIS — Z113 Encounter for screening for infections with a predominantly sexual mode of transmission: Secondary | ICD-10-CM

## 2015-01-20 DIAGNOSIS — Z1151 Encounter for screening for human papillomavirus (HPV): Secondary | ICD-10-CM | POA: Diagnosis present

## 2015-01-20 DIAGNOSIS — Z3202 Encounter for pregnancy test, result negative: Secondary | ICD-10-CM | POA: Diagnosis not present

## 2015-01-20 DIAGNOSIS — D259 Leiomyoma of uterus, unspecified: Secondary | ICD-10-CM | POA: Diagnosis not present

## 2015-01-20 DIAGNOSIS — Z01812 Encounter for preprocedural laboratory examination: Secondary | ICD-10-CM

## 2015-01-20 DIAGNOSIS — D219 Benign neoplasm of connective and other soft tissue, unspecified: Secondary | ICD-10-CM | POA: Insufficient documentation

## 2015-01-20 DIAGNOSIS — N760 Acute vaginitis: Secondary | ICD-10-CM | POA: Insufficient documentation

## 2015-01-20 DIAGNOSIS — Z01411 Encounter for gynecological examination (general) (routine) with abnormal findings: Secondary | ICD-10-CM | POA: Diagnosis not present

## 2015-01-20 LAB — CBC
HCT: 31.8 % — ABNORMAL LOW (ref 36.0–46.0)
Hemoglobin: 9.9 g/dL — ABNORMAL LOW (ref 12.0–15.0)
MCH: 23.6 pg — ABNORMAL LOW (ref 26.0–34.0)
MCHC: 31.1 g/dL (ref 30.0–36.0)
MCV: 75.9 fL — ABNORMAL LOW (ref 78.0–100.0)
MPV: 10.6 fL (ref 8.6–12.4)
Platelets: 354 10*3/uL (ref 150–400)
RBC: 4.19 MIL/uL (ref 3.87–5.11)
RDW: 19 % — ABNORMAL HIGH (ref 11.5–15.5)
WBC: 3.8 10*3/uL — ABNORMAL LOW (ref 4.0–10.5)

## 2015-01-20 LAB — TSH: TSH: 1.908 u[IU]/mL (ref 0.350–4.500)

## 2015-01-20 LAB — POCT PREGNANCY, URINE: Preg Test, Ur: NEGATIVE

## 2015-01-20 MED ORDER — IBUPROFEN 600 MG PO TABS
600.0000 mg | ORAL_TABLET | Freq: Four times a day (QID) | ORAL | Status: DC | PRN
Start: 2015-01-20 — End: 2015-05-28

## 2015-01-20 MED ORDER — DOCUSATE SODIUM 100 MG PO CAPS: 100.0000 mg | ORAL_CAPSULE | Freq: Two times a day (BID) | ORAL | Status: DC | PRN

## 2015-01-20 MED ORDER — FERROUS SULFATE 325 (65 FE) MG PO TABS: 325.0000 mg | ORAL_TABLET | Freq: Three times a day (TID) | ORAL | Status: DC

## 2015-01-20 MED ORDER — MEDROXYPROGESTERONE ACETATE 150 MG/ML IM SUSP
150.0000 mg | INTRAMUSCULAR | Status: DC
  Administered 2015-01-20 – 2017-08-14 (×6): 150 mg via INTRAMUSCULAR

## 2015-01-20 NOTE — Patient Instructions (Signed)
Preventive Care for Adults A healthy lifestyle and preventive care can promote health and wellness. Preventive health guidelines for women include the following key practices.  A routine yearly physical is a good way to check with your health care provider about your health and preventive screening. It is a chance to share any concerns and updates on your health and to receive a thorough exam.  Visit your dentist for a routine exam and preventive care every 6 months. Brush your teeth twice a day and floss once a day. Good oral hygiene prevents tooth decay and gum disease.  The frequency of eye exams is based on your age, health, family medical history, use of contact lenses, and other factors. Follow your health care provider's recommendations for frequency of eye exams.  Eat a healthy diet. Foods like vegetables, fruits, whole grains, low-fat dairy products, and lean protein foods contain the nutrients you need without too many calories. Decrease your intake of foods high in solid fats, added sugars, and salt. Eat the right amount of calories for you.Get information about a proper diet from your health care provider, if necessary.  Regular physical exercise is one of the most important things you can do for your health. Most adults should get at least 150 minutes of moderate-intensity exercise (any activity that increases your heart rate and causes you to sweat) each week. In addition, most adults need muscle-strengthening exercises on 2 or more days a week.  Maintain a healthy weight. The body mass index (BMI) is a screening tool to identify possible weight problems. It provides an estimate of body fat based on height and weight. Your health care provider can find your BMI and can help you achieve or maintain a healthy weight.For adults 20 years and older:  A BMI below 18.5 is considered underweight.  A BMI of 18.5 to 24.9 is normal.  A BMI of 25 to 29.9 is considered overweight.  A BMI of  30 and above is considered obese.  Maintain normal blood lipids and cholesterol levels by exercising and minimizing your intake of saturated fat. Eat a balanced diet with plenty of fruit and vegetables. Blood tests for lipids and cholesterol should begin at age 76 and be repeated every 5 years. If your lipid or cholesterol levels are high, you are over 50, or you are at high risk for heart disease, you may need your cholesterol levels checked more frequently.Ongoing high lipid and cholesterol levels should be treated with medicines if diet and exercise are not working.  If you smoke, find out from your health care provider how to quit. If you do not use tobacco, do not start.  Lung cancer screening is recommended for adults aged 22-80 years who are at high risk for developing lung cancer because of a history of smoking. A yearly low-dose CT scan of the lungs is recommended for people who have at least a 30-pack-year history of smoking and are a current smoker or have quit within the past 15 years. A pack year of smoking is smoking an average of 1 pack of cigarettes a day for 1 year (for example: 1 pack a day for 30 years or 2 packs a day for 15 years). Yearly screening should continue until the smoker has stopped smoking for at least 15 years. Yearly screening should be stopped for people who develop a health problem that would prevent them from having lung cancer treatment.  If you are pregnant, do not drink alcohol. If you are breastfeeding,  be very cautious about drinking alcohol. If you are not pregnant and choose to drink alcohol, do not have more than 1 drink per day. One drink is considered to be 12 ounces (355 mL) of beer, 5 ounces (148 mL) of wine, or 1.5 ounces (44 mL) of liquor.  Avoid use of street drugs. Do not share needles with anyone. Ask for help if you need support or instructions about stopping the use of drugs.  High blood pressure causes heart disease and increases the risk of  stroke. Your blood pressure should be checked at least every 1 to 2 years. Ongoing high blood pressure should be treated with medicines if weight loss and exercise do not work.  If you are 75-52 years old, ask your health care provider if you should take aspirin to prevent strokes.  Diabetes screening involves taking a blood sample to check your fasting blood sugar level. This should be done once every 3 years, after age 15, if you are within normal weight and without risk factors for diabetes. Testing should be considered at a younger age or be carried out more frequently if you are overweight and have at least 1 risk factor for diabetes.  Breast cancer screening is essential preventive care for women. You should practice "breast self-awareness." This means understanding the normal appearance and feel of your breasts and may include breast self-examination. Any changes detected, no matter how small, should be reported to a health care provider. Women in their 58s and 30s should have a clinical breast exam (CBE) by a health care provider as part of a regular health exam every 1 to 3 years. After age 16, women should have a CBE every year. Starting at age 53, women should consider having a mammogram (breast X-ray test) every year. Women who have a family history of breast cancer should talk to their health care provider about genetic screening. Women at a high risk of breast cancer should talk to their health care providers about having an MRI and a mammogram every year.  Breast cancer gene (BRCA)-related cancer risk assessment is recommended for women who have family members with BRCA-related cancers. BRCA-related cancers include breast, ovarian, tubal, and peritoneal cancers. Having family members with these cancers may be associated with an increased risk for harmful changes (mutations) in the breast cancer genes BRCA1 and BRCA2. Results of the assessment will determine the need for genetic counseling and  BRCA1 and BRCA2 testing.  Routine pelvic exams to screen for cancer are no longer recommended for nonpregnant women who are considered low risk for cancer of the pelvic organs (ovaries, uterus, and vagina) and who do not have symptoms. Ask your health care provider if a screening pelvic exam is right for you.  If you have had past treatment for cervical cancer or a condition that could lead to cancer, you need Pap tests and screening for cancer for at least 20 years after your treatment. If Pap tests have been discontinued, your risk factors (such as having a new sexual partner) need to be reassessed to determine if screening should be resumed. Some women have medical problems that increase the chance of getting cervical cancer. In these cases, your health care provider may recommend more frequent screening and Pap tests.  The HPV test is an additional test that may be used for cervical cancer screening. The HPV test looks for the virus that can cause the cell changes on the cervix. The cells collected during the Pap test can be  tested for HPV. The HPV test could be used to screen women aged 30 years and older, and should be used in women of any age who have unclear Pap test results. After the age of 30, women should have HPV testing at the same frequency as a Pap test.  Colorectal cancer can be detected and often prevented. Most routine colorectal cancer screening begins at the age of 50 years and continues through age 75 years. However, your health care provider may recommend screening at an earlier age if you have risk factors for colon cancer. On a yearly basis, your health care provider may provide home test kits to check for hidden blood in the stool. Use of a small camera at the end of a tube, to directly examine the colon (sigmoidoscopy or colonoscopy), can detect the earliest forms of colorectal cancer. Talk to your health care provider about this at age 50, when routine screening begins. Direct  exam of the colon should be repeated every 5-10 years through age 75 years, unless early forms of pre-cancerous polyps or small growths are found.  People who are at an increased risk for hepatitis B should be screened for this virus. You are considered at high risk for hepatitis B if:  You were born in a country where hepatitis B occurs often. Talk with your health care provider about which countries are considered high risk.  Your parents were born in a high-risk country and you have not received a shot to protect against hepatitis B (hepatitis B vaccine).  You have HIV or AIDS.  You use needles to inject street drugs.  You live with, or have sex with, someone who has hepatitis B.  You get hemodialysis treatment.  You take certain medicines for conditions like cancer, organ transplantation, and autoimmune conditions.  Hepatitis C blood testing is recommended for all people born from 1945 through 1965 and any individual with known risks for hepatitis C.  Practice safe sex. Use condoms and avoid high-risk sexual practices to reduce the spread of sexually transmitted infections (STIs). STIs include gonorrhea, chlamydia, syphilis, trichomonas, herpes, HPV, and human immunodeficiency virus (HIV). Herpes, HIV, and HPV are viral illnesses that have no cure. They can result in disability, cancer, and death.  You should be screened for sexually transmitted illnesses (STIs) including gonorrhea and chlamydia if:  You are sexually active and are younger than 24 years.  You are older than 24 years and your health care provider tells you that you are at risk for this type of infection.  Your sexual activity has changed since you were last screened and you are at an increased risk for chlamydia or gonorrhea. Ask your health care provider if you are at risk.  If you are at risk of being infected with HIV, it is recommended that you take a prescription medicine daily to prevent HIV infection. This is  called preexposure prophylaxis (PrEP). You are considered at risk if:  You are a heterosexual woman, are sexually active, and are at increased risk for HIV infection.  You take drugs by injection.  You are sexually active with a partner who has HIV.  Talk with your health care provider about whether you are at high risk of being infected with HIV. If you choose to begin PrEP, you should first be tested for HIV. You should then be tested every 3 months for as long as you are taking PrEP.  Osteoporosis is a disease in which the bones lose minerals and strength   with aging. This can result in serious bone fractures or breaks. The risk of osteoporosis can be identified using a bone density scan. Women ages 65 years and over and women at risk for fractures or osteoporosis should discuss screening with their health care providers. Ask your health care provider whether you should take a calcium supplement or vitamin D to reduce the rate of osteoporosis.  Menopause can be associated with physical symptoms and risks. Hormone replacement therapy is available to decrease symptoms and risks. You should talk to your health care provider about whether hormone replacement therapy is right for you.  Use sunscreen. Apply sunscreen liberally and repeatedly throughout the day. You should seek shade when your shadow is shorter than you. Protect yourself by wearing long sleeves, pants, a wide-brimmed hat, and sunglasses year round, whenever you are outdoors.  Once a month, do a whole body skin exam, using a mirror to look at the skin on your back. Tell your health care provider of new moles, moles that have irregular borders, moles that are larger than a pencil eraser, or moles that have changed in shape or color.  Stay current with required vaccines (immunizations).  Influenza vaccine. All adults should be immunized every year.  Tetanus, diphtheria, and acellular pertussis (Td, Tdap) vaccine. Pregnant women should  receive 1 dose of Tdap vaccine during each pregnancy. The dose should be obtained regardless of the length of time since the last dose. Immunization is preferred during the 27th-36th week of gestation. An adult who has not previously received Tdap or who does not know her vaccine status should receive 1 dose of Tdap. This initial dose should be followed by tetanus and diphtheria toxoids (Td) booster doses every 10 years. Adults with an unknown or incomplete history of completing a 3-dose immunization series with Td-containing vaccines should begin or complete a primary immunization series including a Tdap dose. Adults should receive a Td booster every 10 years.  Varicella vaccine. An adult without evidence of immunity to varicella should receive 2 doses or a second dose if she has previously received 1 dose. Pregnant females who do not have evidence of immunity should receive the first dose after pregnancy. This first dose should be obtained before leaving the health care facility. The second dose should be obtained 4-8 weeks after the first dose.  Human papillomavirus (HPV) vaccine. Females aged 13-26 years who have not received the vaccine previously should obtain the 3-dose series. The vaccine is not recommended for use in pregnant females. However, pregnancy testing is not needed before receiving a dose. If a female is found to be pregnant after receiving a dose, no treatment is needed. In that case, the remaining doses should be delayed until after the pregnancy. Immunization is recommended for any person with an immunocompromised condition through the age of 26 years if she did not get any or all doses earlier. During the 3-dose series, the second dose should be obtained 4-8 weeks after the first dose. The third dose should be obtained 24 weeks after the first dose and 16 weeks after the second dose.  Zoster vaccine. One dose is recommended for adults aged 60 years or older unless certain conditions are  present.  Measles, mumps, and rubella (MMR) vaccine. Adults born before 1957 generally are considered immune to measles and mumps. Adults born in 1957 or later should have 1 or more doses of MMR vaccine unless there is a contraindication to the vaccine or there is laboratory evidence of immunity to   each of the three diseases. A routine second dose of MMR vaccine should be obtained at least 28 days after the first dose for students attending postsecondary schools, health care workers, or international travelers. People who received inactivated measles vaccine or an unknown type of measles vaccine during 1963-1967 should receive 2 doses of MMR vaccine. People who received inactivated mumps vaccine or an unknown type of mumps vaccine before 1979 and are at high risk for mumps infection should consider immunization with 2 doses of MMR vaccine. For females of childbearing age, rubella immunity should be determined. If there is no evidence of immunity, females who are not pregnant should be vaccinated. If there is no evidence of immunity, females who are pregnant should delay immunization until after pregnancy. Unvaccinated health care workers born before 1957 who lack laboratory evidence of measles, mumps, or rubella immunity or laboratory confirmation of disease should consider measles and mumps immunization with 2 doses of MMR vaccine or rubella immunization with 1 dose of MMR vaccine.  Pneumococcal 13-valent conjugate (PCV13) vaccine. When indicated, a person who is uncertain of her immunization history and has no record of immunization should receive the PCV13 vaccine. An adult aged 19 years or older who has certain medical conditions and has not been previously immunized should receive 1 dose of PCV13 vaccine. This PCV13 should be followed with a dose of pneumococcal polysaccharide (PPSV23) vaccine. The PPSV23 vaccine dose should be obtained at least 8 weeks after the dose of PCV13 vaccine. An adult aged 19  years or older who has certain medical conditions and previously received 1 or more doses of PPSV23 vaccine should receive 1 dose of PCV13. The PCV13 vaccine dose should be obtained 1 or more years after the last PPSV23 vaccine dose.  Pneumococcal polysaccharide (PPSV23) vaccine. When PCV13 is also indicated, PCV13 should be obtained first. All adults aged 65 years and older should be immunized. An adult younger than age 65 years who has certain medical conditions should be immunized. Any person who resides in a nursing home or long-term care facility should be immunized. An adult smoker should be immunized. People with an immunocompromised condition and certain other conditions should receive both PCV13 and PPSV23 vaccines. People with human immunodeficiency virus (HIV) infection should be immunized as soon as possible after diagnosis. Immunization during chemotherapy or radiation therapy should be avoided. Routine use of PPSV23 vaccine is not recommended for American Indians, [place] Natives, or people younger than 65 years unless there are medical conditions that require PPSV23 vaccine. When indicated, people who have unknown immunization and have no record of immunization should receive PPSV23 vaccine. One-time revaccination 5 years after the first dose of PPSV23 is recommended for people aged 19-64 years who have chronic kidney failure, nephrotic syndrome, asplenia, or immunocompromised conditions. People who received 1-2 doses of PPSV23 before age 65 years should receive another dose of PPSV23 vaccine at age 65 years or later if at least 5 years have passed since the previous dose. Doses of PPSV23 are not needed for people immunized with PPSV23 at or after age 65 years.  Meningococcal vaccine. Adults with asplenia or persistent complement component deficiencies should receive 2 doses of quadrivalent meningococcal conjugate (MenACWY-D) vaccine. The doses should be obtained at least 2 months apart.  Microbiologists working with certain meningococcal bacteria, military recruits, people at risk during an outbreak, and people who travel to or live in countries with a high rate of meningitis should be immunized. A first-year college student up through age   21 years who is living in a residence hall should receive a dose if she did not receive a dose on or after her 16th birthday. Adults who have certain high-risk conditions should receive one or more doses of vaccine.  Hepatitis A vaccine. Adults who wish to be protected from this disease, have certain high-risk conditions, work with hepatitis A-infected animals, work in hepatitis A research labs, or travel to or work in countries with a high rate of hepatitis A should be immunized. Adults who were previously unvaccinated and who anticipate close contact with an international adoptee during the first 60 days after arrival in the [place] from a country with a high rate of hepatitis A should be immunized.  Hepatitis B vaccine. Adults who wish to be protected from this disease, have certain high-risk conditions, may be exposed to blood or other infectious body fluids, are household contacts or sex partners of hepatitis B positive people, are clients or workers in certain care facilities, or travel to or work in countries with a high rate of hepatitis B should be immunized.  Haemophilus influenzae type b (Hib) vaccine. A previously unvaccinated person with asplenia or sickle cell disease or having a scheduled splenectomy should receive 1 dose of Hib vaccine. Regardless of previous immunization, a recipient of a hematopoietic stem cell transplant should receive a 3-dose series 6-12 months after her successful transplant. Hib vaccine is not recommended for adults with HIV infection. Preventive Services / Frequency Ages 64 to 68 years  Blood pressure check.** / Every 1 to 2 years.  Lipid and cholesterol check.** / Every 5 years beginning at age  22.  Clinical breast exam.** / Every 3 years for women in their 88s and 53s.  BRCA-related cancer risk assessment.** / For women who have family members with a BRCA-related cancer (breast, ovarian, tubal, or peritoneal cancers).  Pap test.** / Every 2 years from ages 90 through 51. Every 3 years starting at age 21 through age 56 or 3 with a history of 3 consecutive normal Pap tests.  HPV screening.** / Every 3 years from ages 24 through ages 1 to 46 with a history of 3 consecutive normal Pap tests.  Hepatitis C blood test.** / For any individual with known risks for hepatitis C.  Skin self-exam. / Monthly.  Influenza vaccine. / Every year.  Tetanus, diphtheria, and acellular pertussis (Tdap, Td) vaccine.** / Consult your health care provider. Pregnant women should receive 1 dose of Tdap vaccine during each pregnancy. 1 dose of Td every 10 years.  Varicella vaccine.** / Consult your health care provider. Pregnant females who do not have evidence of immunity should receive the first dose after pregnancy.  HPV vaccine. / 3 doses over 6 months, if 72 and younger. The vaccine is not recommended for use in pregnant females. However, pregnancy testing is not needed before receiving a dose.  Measles, mumps, rubella (MMR) vaccine.** / You need at least 1 dose of MMR if you were born in 1957 or later. You may also need a 2nd dose. For females of childbearing age, rubella immunity should be determined. If there is no evidence of immunity, females who are not pregnant should be vaccinated. If there is no evidence of immunity, females who are pregnant should delay immunization until after pregnancy.  Pneumococcal 13-valent conjugate (PCV13) vaccine.** / Consult your health care provider.  Pneumococcal polysaccharide (PPSV23) vaccine.** / 1 to 2 doses if you smoke cigarettes or if you have certain conditions.  Meningococcal vaccine.** /  1 dose if you are age 19 to 21 years and a first-year college  student living in a residence hall, or have one of several medical conditions, you need to get vaccinated against meningococcal disease. You may also need additional booster doses.  Hepatitis A vaccine.** / Consult your health care provider.  Hepatitis B vaccine.** / Consult your health care provider.  Haemophilus influenzae type b (Hib) vaccine.** / Consult your health care provider. Ages 40 to 64 years  Blood pressure check.** / Every 1 to 2 years.  Lipid and cholesterol check.** / Every 5 years beginning at age 20 years.  Lung cancer screening. / Every year if you are aged 55-80 years and have a 30-pack-year history of smoking and currently smoke or have quit within the past 15 years. Yearly screening is stopped once you have quit smoking for at least 15 years or develop a health problem that would prevent you from having lung cancer treatment.  Clinical breast exam.** / Every year after age 40 years.  BRCA-related cancer risk assessment.** / For women who have family members with a BRCA-related cancer (breast, ovarian, tubal, or peritoneal cancers).  Mammogram.** / Every year beginning at age 40 years and continuing for as long as you are in good health. Consult with your health care provider.  Pap test.** / Every 3 years starting at age 30 years through age 65 or 70 years with a history of 3 consecutive normal Pap tests.  HPV screening.** / Every 3 years from ages 30 years through ages 65 to 70 years with a history of 3 consecutive normal Pap tests.  Fecal occult blood test (FOBT) of stool. / Every year beginning at age 50 years and continuing until age 75 years. You may not need to do this test if you get a colonoscopy every 10 years.  Flexible sigmoidoscopy or colonoscopy.** / Every 5 years for a flexible sigmoidoscopy or every 10 years for a colonoscopy beginning at age 50 years and continuing until age 75 years.  Hepatitis C blood test.** / For all people born from 1945 through  1965 and any individual with known risks for hepatitis C.  Skin self-exam. / Monthly.  Influenza vaccine. / Every year.  Tetanus, diphtheria, and acellular pertussis (Tdap/Td) vaccine.** / Consult your health care provider. Pregnant women should receive 1 dose of Tdap vaccine during each pregnancy. 1 dose of Td every 10 years.  Varicella vaccine.** / Consult your health care provider. Pregnant females who do not have evidence of immunity should receive the first dose after pregnancy.  Zoster vaccine.** / 1 dose for adults aged 60 years or older.  Measles, mumps, rubella (MMR) vaccine.** / You need at least 1 dose of MMR if you were born in 1957 or later. You may also need a 2nd dose. For females of childbearing age, rubella immunity should be determined. If there is no evidence of immunity, females who are not pregnant should be vaccinated. If there is no evidence of immunity, females who are pregnant should delay immunization until after pregnancy.  Pneumococcal 13-valent conjugate (PCV13) vaccine.** / Consult your health care provider.  Pneumococcal polysaccharide (PPSV23) vaccine.** / 1 to 2 doses if you smoke cigarettes or if you have certain conditions.  Meningococcal vaccine.** / Consult your health care provider.  Hepatitis A vaccine.** / Consult your health care provider.  Hepatitis B vaccine.** / Consult your health care provider.  Haemophilus influenzae type b (Hib) vaccine.** / Consult your health care provider. Ages 65   years and over  Blood pressure check.** / Every 1 to 2 years.  Lipid and cholesterol check.** / Every 5 years beginning at age 22 years.  Lung cancer screening. / Every year if you are aged 73-80 years and have a 30-pack-year history of smoking and currently smoke or have quit within the past 15 years. Yearly screening is stopped once you have quit smoking for at least 15 years or develop a health problem that would prevent you from having lung cancer  treatment.  Clinical breast exam.** / Every year after age 4 years.  BRCA-related cancer risk assessment.** / For women who have family members with a BRCA-related cancer (breast, ovarian, tubal, or peritoneal cancers).  Mammogram.** / Every year beginning at age 40 years and continuing for as long as you are in good health. Consult with your health care provider.  Pap test.** / Every 3 years starting at age 9 years through age 34 or 91 years with 3 consecutive normal Pap tests. Testing can be stopped between 65 and 70 years with 3 consecutive normal Pap tests and no abnormal Pap or HPV tests in the past 10 years.  HPV screening.** / Every 3 years from ages 57 years through ages 64 or 45 years with a history of 3 consecutive normal Pap tests. Testing can be stopped between 65 and 70 years with 3 consecutive normal Pap tests and no abnormal Pap or HPV tests in the past 10 years.  Fecal occult blood test (FOBT) of stool. / Every year beginning at age 15 years and continuing until age 17 years. You may not need to do this test if you get a colonoscopy every 10 years.  Flexible sigmoidoscopy or colonoscopy.** / Every 5 years for a flexible sigmoidoscopy or every 10 years for a colonoscopy beginning at age 86 years and continuing until age 71 years.  Hepatitis C blood test.** / For all people born from [birth year] through 1965 and any individual with known risks for hepatitis C.  Osteoporosis screening.** / A one-time screening for women ages 83 years and over and women at risk for fractures or osteoporosis.  Skin self-exam. / Monthly.  Influenza vaccine. / Every year.  Tetanus, diphtheria, and acellular pertussis (Tdap/Td) vaccine.** / 1 dose of Td every 10 years.  Varicella vaccine.** / Consult your health care provider.  Zoster vaccine.** / 1 dose for adults aged 61 years or older.  Pneumococcal 13-valent conjugate (PCV13) vaccine.** / Consult your health care provider.  Pneumococcal  polysaccharide (PPSV23) vaccine.** / 1 dose for all adults aged 28 years and older.  Meningococcal vaccine.** / Consult your health care provider.  Hepatitis A vaccine.** / Consult your health care provider.  Hepatitis B vaccine.** / Consult your health care provider.  Haemophilus influenzae type b (Hib) vaccine.** / Consult your health care provider. ** Family history and personal history of risk and conditions may change your health care provider's recommendations. Document Released: 09/25/2001 Document Revised: 12/14/2013 Document Reviewed: 12/25/2010 [facility] Patient Information 2015 [place], [place]. This information is not intended to replace advice given to you by your health care provider. Make sure you discuss any questions you have with your health care provider.

## 2015-01-20 NOTE — Progress Notes (Signed)
GYNECOLOGY CLINIC ANNUAL PREVENTATIVE CARE ENCOUNTER NOTE  Subjective:   [name] is a 39 y.o. [id number] female here for a routine annual gynecologic exam.  Current complaints: menorrhagia every month, desires to be on Depo Provera.  Declines Mirena or other contraception; has tried Mirena in past.   Denies abnormal vaginal  discharge, problems with intercourse or other gynecologic concerns.    Gynecologic History Patient's last menstrual period was 12/17/2014. Contraception: none Last Pap: 2014. Results were: abnormal with LGSIL, benign colposcopy  Obstetric History OB History  Gravida Para Term Preterm AB SAB TAB Ectopic Multiple Living  5 2 0 2 3 2 1 0 0 2     # Outcome Date GA Lbr Len/2nd Weight Sex Delivery Anes PTL Lv  5 SAB           4 SAB  [redacted]w[redacted]d   M         Comments: iufd  3 TAB           2 Preterm  [redacted]w[redacted]d 17:00     Y Y  1 Preterm  [redacted]w[redacted]d 06:00 6 lb 3 oz (2.807 kg) M Vag-Spont None Y Y      Past Medical History  Diagnosis Date  . Anemia   . Enlarged heart     managed by cardiology  . Hypertension   . Lupus 1999  . Allergy   . Gestational diabetes   . Heart murmur   . Infection     UTI  . Depression     after losses  . Fibroid   . Vaginal Pap smear, abnormal     colpo, HPV    Past Surgical History  Procedure Laterality Date  . Therapeutic abortion      Current Outpatient Prescriptions on File Prior to Visit  Medication Sig Dispense Refill  . albuterol (PROVENTIL HFA;VENTOLIN HFA) 108 (90 BASE) MCG/ACT inhaler Inhale 2 puffs into the lungs every 6 (six) hours as needed for wheezing or shortness of breath.    Marland Kitchen amLODipine (NORVASC) 10 MG tablet Take 1 tablet (10 mg total) by mouth daily. 30 tablet 0  . cloNIDine (CATAPRES) 0.3 MG tablet Take 0.3 mg by mouth 2 (two) times daily.    . hydroxychloroquine (PLAQUENIL) 200 MG tablet Take 200 mg by mouth 2 (two) times daily.    . medroxyPROGESTERone (DEPO-PROVERA) 150 MG/ML injection Inject 150 mg into  the muscle every 3 (three) months.    . metFORMIN (GLUCOPHAGE) 500 MG tablet Take 1 tablet (500 mg total) by mouth 2 (two) times daily with a meal. 60 tablet 1  . cloNIDine (CATAPRES) 0.2 MG tablet Take 0.2 mg by mouth 2 (two) times daily.    . [DISCONTINUED] fexofenadine (ALLEGRA) 180 MG tablet Take 180 mg by mouth daily.     No current facility-administered medications on file prior to visit.    Allergies  Allergen Reactions  . Septra [Bactrim] Hives    History   Social History  . Marital Status: Married    Spouse Name: N/A  . Number of Children: N/A  . Years of Education: N/A   Occupational History  . Not on file.   Social History Main Topics  . Smoking status: Never Smoker   . Smokeless tobacco: Never Used  . Alcohol Use: Yes     Comment: rarely  . Drug Use: No  . Sexual Activity: Yes    Birth Control/ Protection: None, Injection   Other Topics Concern  . Not on  file   Social History Narrative    Family History  Problem Relation Age of Onset  . Diabetes Mother   . Diabetes Maternal Uncle   . Hypertension Maternal Grandmother   . Diabetes Father   . Cancer Maternal Grandfather     lung  . Hearing loss Neg Hx     The following portions of the patient's history were reviewed and updated as appropriate: allergies, current medications, past family history, past medical history, past social history, past surgical history and problem list.  Review of Systems Pertinent items are noted in HPI.   Objective:   BP 156/98 mmHg  Pulse 101  Temp(Src) 98.6 F (37 C)  Ht 5' (1.524 m)  Wt 236 lb 8 oz (107.276 kg)  BMI 46.19 kg/m2  LMP 12/17/2014 CONSTITUTIONAL: Well-developed, well-nourished female in no acute distress.  HENT:  Normocephalic, atraumatic, External right and left ear normal. Oropharynx is clear and moist EYES: Conjunctivae and EOM are normal. Pupils are equal, round, and reactive to light. No scleral icterus.  NECK: Normal range of motion, supple,  no masses.  Normal thyroid.  SKIN: Skin is warm and dry. No rash noted. Not diaphoretic. No erythema. No pallor. [place]: Alert and oriented to person, place, and time. Normal reflexes, muscle tone coordination. No cranial nerve deficit noted. PSYCHIATRIC: Normal mood and affect. Normal behavior. Normal judgment and thought content. CARDIOVASCULAR: Normal heart rate noted, regular rhythm RESPIRATORY: Clear to auscultation bilaterally. Effort and breath sounds normal, no problems with respiration noted. BREASTS: Symmetric in size. No masses, skin changes, nipple drainage, or lymphadenopathy. ABDOMEN: Soft, obese, normal bowel sounds, no distention appreciated.  No tenderness, rebound or guarding.  PELVIC: Normal appearing external genitalia; normal appearing vaginal mucosa and cervix.  Normal appearing discharge.  Pap smear obtained, small amount of bleeding noted after pap.  Unable to palpate uterus or adnexa secondary to habitus.  MUSCULOSKELETAL: Normal range of motion. No tenderness.  No cyanosis, clubbing, or edema.  2+ distal pulses.  Imaging 01/20/2015  TRANSABDOMINAL AND TRANSVAGINAL ULTRASOUND OF PELVIS  CLINICAL DATA: Abnormal uterine bleeding   COMPARISON: US TRANSVAGINAL NON-OB dated 02/03/2009 FINDINGS:Uterus  Measurements: 10.0 x 8.0 x 7.1 cm. Multiple focal fibroids are noted. Anterior intramural fibroid is the largest fibroid currently measuring up to 4.7 x 3.6 x 3.6 cm this demonstrates rim calcifications. Posterior subserosal fibroid within the right sided uterus measures 3.5 by 2.8 x 2.7 cm with rim calcifications. Small subserosal fundal fibroid measures 1.7 x 1.6 x 1.5 cm. Endometrium Thickness: 16 mm in thickness. Small amount of fluid in the endometrial canal. No focal abnormality visualized. Right ovary Measurements: 2.1 x 3.8 x 2.9 cm. Normal appearance/no adnexal mass. Left ovary  Measurements: 1.9 x 0.8 x 2.0 cm. Normal appearance/no adnexal mass. Other  findings No free fluid. IMPRESSION:  Multiple uterine fibroids. Endometrium borderline thickened at 16 mm. If bleeding remains unresponsive to hormonal or medical therapy, focal lesion work-up with sonohysterogram should be considered. Endometrial biopsy should also be considered in pre-menopausal patients at high risk for endometrial carcinoma. (Ref: Radiological Reasoning: Algorithmic Workup of Abnormal Vaginal Bleeding with Endovaginal Sonography and Sonohysterography. AJR 2008; 259:D63-87). Electronically Signed  By: Rolm Baptise M.D.  On: 09/23/2013 16:31   Assessment:   Annual gynecologic examination with pap smear Contraception initiation Menorrhagia   Plan:   Will follow up results of pap smear and manage accordingly. For menorrhagia and contraception, Depo Provera to be started today.  Will follow up response.  CBC, TSH also  checked.  Iron therapy prescribed; also prescribed Colace and Ibuprofen as needed.  Bleeding precautions reviewed. Routine preventative health maintenance measures emphasized. Please refer to After Visit Summary for other counseling recommendations.   Verita Schneiders, MD, [place] Attending [place] for [employer], [place]

## 2015-01-21 LAB — CYTOLOGY - PAP

## 2015-01-24 ENCOUNTER — Encounter: Payer: Self-pay | Admitting: Obstetrics & Gynecology

## 2015-01-25 ENCOUNTER — Telehealth: Payer: Self-pay

## 2015-01-25 NOTE — Telephone Encounter (Signed)
-----   Message from Osborne Oman, MD sent at 01/24/2015  5:13 PM EDT ----- Needs appointment for colposcopy with ECC and endometrial biopsy for LGSIL pap with atypical glandular cells seen on 01/20/15.  Please call to inform patient of results and recommendations.

## 2015-01-25 NOTE — Telephone Encounter (Signed)
Called patient and informed her of abnormal pap results and need for colpo and endo bx. Informed her she will be contacted by front office staff with appointment and advised she call clinic if she has not heard from them by next week. Patient verbalized understanding and gratitude to all and denied any questions or concerns. Message sent to admin pool to schedule and call patient.

## 2015-02-06 ENCOUNTER — Encounter (HOSPITAL_COMMUNITY): Payer: Self-pay | Admitting: *Deleted

## 2015-02-06 ENCOUNTER — Emergency Department (HOSPITAL_COMMUNITY)
Admission: EM | Admit: 2015-02-06 | Discharge: 2015-02-06 | Disposition: A | Discharge status: Home / Self Care | Payer: Medicaid Other | Attending: Emergency Medicine | Admitting: Emergency Medicine

## 2015-02-06 DIAGNOSIS — Z8739 Personal history of other diseases of the musculoskeletal system and connective tissue: Secondary | ICD-10-CM | POA: Diagnosis not present

## 2015-02-06 DIAGNOSIS — Z79899 Other long term (current) drug therapy: Secondary | ICD-10-CM | POA: Diagnosis not present

## 2015-02-06 DIAGNOSIS — H538 Other visual disturbances: Secondary | ICD-10-CM | POA: Insufficient documentation

## 2015-02-06 DIAGNOSIS — Z8659 Personal history of other mental and behavioral disorders: Secondary | ICD-10-CM | POA: Insufficient documentation

## 2015-02-06 DIAGNOSIS — Z8632 Personal history of gestational diabetes: Secondary | ICD-10-CM | POA: Diagnosis not present

## 2015-02-06 DIAGNOSIS — I10 Essential (primary) hypertension: Secondary | ICD-10-CM | POA: Diagnosis not present

## 2015-02-06 DIAGNOSIS — H5350 Unspecified color vision deficiencies: Secondary | ICD-10-CM

## 2015-02-06 DIAGNOSIS — Z862 Personal history of diseases of the blood and blood-forming organs and certain disorders involving the immune mechanism: Secondary | ICD-10-CM | POA: Diagnosis not present

## 2015-02-06 DIAGNOSIS — Z86018 Personal history of other benign neoplasm: Secondary | ICD-10-CM | POA: Insufficient documentation

## 2015-02-06 DIAGNOSIS — R011 Cardiac murmur, unspecified: Secondary | ICD-10-CM | POA: Diagnosis not present

## 2015-02-06 DIAGNOSIS — R42 Dizziness and giddiness: Secondary | ICD-10-CM | POA: Diagnosis present

## 2015-02-06 DIAGNOSIS — Z87440 Personal history of urinary (tract) infections: Secondary | ICD-10-CM | POA: Diagnosis not present

## 2015-02-06 NOTE — Discharge Instructions (Signed)
Visual Disturbances °You have had a disturbance in your vision. This may be caused by various conditions, such as: °· Migraines. Migraine headaches are often preceded by a disturbance in vision. Blind spots or light flashes are followed by a headache. This type of visual disturbance is temporary. It does not damage the eye. °· Glaucoma. This is caused by increased pressure in the eye. Symptoms include haziness, blurred vision, or seeing rainbow colored circles when looking at bright lights. Partial or complete visual loss can occur. You may or may not experience eye pain. Visual loss may be gradual or sudden and is irreversible. Glaucoma is the leading cause of blindness. °· Retina problems. Vision will be reduced if the retina becomes detached or if there is a circulation problem as with diabetes, high blood pressure, or a mini-stroke. Symptoms include seeing "floaters," flashes of light, or shadows, as if a curtain has fallen over your eye. °· Optic nerve problems. The main nerve in your eye can be damaged by redness, soreness, and swelling (inflammation), poor circulation, drugs, and toxins. °It is very important to have a complete exam done by a specialist to determine the exact cause of your eye problem. The specialist may recommend medicines or surgery, depending on the cause of the problem. This can help prevent further loss of vision or reduce the risk of having a stroke. Contact the caregiver to whom you have been referred and arrange for follow-up care right away. °SEEK IMMEDIATE MEDICAL CARE IF:  °· Your vision gets worse. °· You develop severe headaches. °· You have any weakness or numbness in the face, arms, or legs. °· You have any trouble speaking or walking. °Document Released: 09/06/2004 Document Revised: 10/22/2011 Document Reviewed: 12/28/2009 °ExitCare® Patient Information ©2015 [employer]. This information is not intended to replace advice given to you by your health care provider. Make sure  you discuss any questions you have with your health care provider. ° °

## 2015-02-06 NOTE — ED Notes (Addendum)
Pt states that on Friday she began experiencing dizziness. States that the dizziness has subsided almost completely but she is now starting to notice that lights are brighter in her vision. States that lights have become brighter and brighter since Friday. States that even the numbers on the cable box are different shades than usual. Pt states that she is diabetic and takes metformin, states she has not checked her sugar in over a week because she cannot find her meter.

## 2015-02-06 NOTE — ED Notes (Signed)
CBG 94 

## 2015-02-06 NOTE — ED Provider Notes (Signed)
CSN: [id number]     Arrival date & time 02/06/15  1923 History   First MD Initiated Contact with Patient 02/06/15 2110     Chief Complaint  Patient presents with  . Dizziness     (Consider location/radiation/quality/duration/timing/severity/associated sxs/prior Treatment) HPI The patient had been at a stoplight and noticed the lights to be much brighter than usual. She reports that she felt slightly dizzy and the green and red lights on the stoplight seems incredibly intense. She did not have a headache in association with this. There was no neurologic dysfunction. The symptoms resolved within a few seconds. White lights don't seem to be bothersome. She does not have frank photophobia or ocular pain. There is no associated headache or visual loss. The patient reports that she was concerned about the symptoms. Past Medical History  Diagnosis Date  . Anemia   . Enlarged heart     managed by cardiology  . Hypertension   . Lupus 1999  . Allergy   . Gestational diabetes   . Heart murmur   . Infection     UTI  . Depression     after losses  . Fibroid   . Abnormal Pap smear of cervix     colpo, HPV   Past Surgical History  Procedure Laterality Date  . Therapeutic abortion     Family History  Problem Relation Age of Onset  . Diabetes Mother   . Diabetes Maternal Uncle   . Hypertension Maternal Grandmother   . Diabetes Father   . Cancer Maternal Grandfather     lung  . Hearing loss Neg Hx    History  Substance Use Topics  . Smoking status: Never Smoker   . Smokeless tobacco: Never Used  . Alcohol Use: Yes     Comment: rarely   OB History    Gravida Para Term Preterm AB TAB SAB Ectopic Multiple Living   5 2 0 2 3 1 2 0 0 2      Review of Systems  10 Systems reviewed and are negative for acute change except as noted in the HPI.   Allergies  Septra  Home Medications   Prior to Admission medications   Medication Sig Start Date End Date Taking? Authorizing Provider   albuterol (PROVENTIL HFA;VENTOLIN HFA) 108 (90 BASE) MCG/ACT inhaler Inhale 2 puffs into the lungs every 6 (six) hours as needed for wheezing or shortness of breath.    Historical Provider, MD  amLODipine (NORVASC) 10 MG tablet Take 1 tablet (10 mg total) by mouth daily. 03/31/12   Clayton Bibles, PA-C  cloNIDine (CATAPRES) 0.2 MG tablet Take 0.2 mg by mouth 2 (two) times daily.    Historical Provider, MD  cloNIDine (CATAPRES) 0.3 MG tablet Take 0.3 mg by mouth 2 (two) times daily.    Historical Provider, MD  docusate sodium (COLACE) 100 MG capsule Take 1 capsule (100 mg total) by mouth 2 (two) times daily as needed. 01/20/15   Osborne Oman, MD  hydroxychloroquine (PLAQUENIL) 200 MG tablet Take 200 mg by mouth 2 (two) times daily.    Historical Provider, MD  ibuprofen (ADVIL,MOTRIN) 600 MG tablet Take 1 tablet (600 mg total) by mouth every 6 (six) hours as needed. 01/20/15   Osborne Oman, MD  medroxyPROGESTERone (DEPO-PROVERA) 150 MG/ML injection Inject 150 mg into the muscle every 3 (three) months.    Historical Provider, MD  metFORMIN (GLUCOPHAGE) 500 MG tablet Take 1 tablet (500 mg total) by mouth 2 (two) times  daily with a meal. 12/25/13   Lucila [place], PA-C   BP 170/99 mmHg  Pulse 94  Temp(Src) 98.3 F (36.8 C) (Oral)  Resp 18  Ht 5' (1.524 m)  Wt 230 lb (104.327 kg)  BMI 44.92 kg/m2  SpO2 100%  LMP 12/17/2014 Physical Exam  Constitutional: She is oriented to person, place, and time. She appears well-developed and well-nourished.  HENT:  Head: Normocephalic and atraumatic.  Eyes: EOM are normal. Pupils are equal, round, and reactive to light.  Bilateral fundus exam no apparent papilledema. Normal consensual pupillary responses. No photophobia.  Neck: Neck supple.  Cardiovascular: Normal rate, regular rhythm, normal heart sounds and intact distal pulses.   Pulmonary/Chest: Effort normal and breath sounds normal.  Abdominal: Soft. Bowel sounds are normal. She exhibits no  distension. There is no tenderness.  Musculoskeletal: Normal range of motion. She exhibits no edema.  Neurological: She is alert and oriented to person, place, and time. She has normal strength. Coordination normal. GCS eye subscore is 4. GCS verbal subscore is 5. GCS motor subscore is 6.  Skin: Skin is warm, dry and intact.  Psychiatric: She has a normal mood and affect.    ED Course  Procedures (including critical care time) Labs Review Labs Reviewed - No data to display  Imaging Review No results found.   EKG Interpretation None      MDM   Final diagnoses:  Visual color changes   The patient's symptoms are suggestive of a visual migraine. She did not have any loss of vision or blurred vision. The predominant symptom is a change in intensity of colors. She does not have any associated pain and the dizziness that she experiences very brief. At this time there are no other associated symptoms. The patient is advised to follow-up with her family doctor and potentially have ophthalmologic referral. She is counseled on signs and symptoms for which to return.    Charlesetta Shanks, MD 02/06/15 [phone]

## 2015-02-06 NOTE — ED Notes (Signed)
Physician at the bedside at this time.

## 2015-02-07 LAB — CBG MONITORING, ED: Glucose-Capillary: 94 mg/dL (ref 65–99)

## 2015-03-04 ENCOUNTER — Other Ambulatory Visit (HOSPITAL_COMMUNITY)
Admission: RE | Admit: 2015-03-04 | Discharge: 2015-03-04 | Disposition: A | Discharge status: Home / Self Care | Payer: Medicaid Other | Source: Ambulatory Visit | Attending: Obstetrics & Gynecology | Admitting: Obstetrics & Gynecology

## 2015-03-04 ENCOUNTER — Ambulatory Visit (INDEPENDENT_AMBULATORY_CARE_PROVIDER_SITE_OTHER): Payer: Medicaid Other | Admitting: Obstetrics & Gynecology

## 2015-03-04 ENCOUNTER — Encounter: Payer: Self-pay | Admitting: Obstetrics & Gynecology

## 2015-03-04 VITALS — BP 132/78 | HR 81 | Temp 98.4°F | Ht 60.0 in | Wt 226.7 lb

## 2015-03-04 DIAGNOSIS — R87619 Unspecified abnormal cytological findings in specimens from cervix uteri: Secondary | ICD-10-CM | POA: Insufficient documentation

## 2015-03-04 DIAGNOSIS — R87612 Low grade squamous intraepithelial lesion on cytologic smear of cervix (LGSIL): Secondary | ICD-10-CM | POA: Diagnosis not present

## 2015-03-04 DIAGNOSIS — Z3202 Encounter for pregnancy test, result negative: Secondary | ICD-10-CM

## 2015-03-04 LAB — POCT PREGNANCY, URINE: Preg Test, Ur: NEGATIVE

## 2015-03-04 NOTE — Progress Notes (Signed)
    GYNECOLOGY CLINIC COLPOSCOPY AND ENDOMETRIAL BIOPSY PROCEDURE NOTE  39 y.o. [id number] here for colposcopy and endometrial biopsy for low-grade squamous intraepithelial neoplasia (LGSIL - encompassing HPV,mild dysplasia,CIN I) with atypical glandular cells on pap smear on 01/20/2015. Discussed role for HPV in cervical dysplasia, need for surveillance.  Patient given informed consent, signed copy in the chart, time out was performed.  Placed in lithotomy position. Cervix viewed with speculum and colposcope after application of acetic acid.   Colposcopy adequate? Yes No visible lesions.  ECC specimen obtained. Cervix was prepped with Betadine. The 3 mm pipelle was introduced into the endometrial cavity without difficulty to a depth of 8cm, and a moderate amount of tissue was obtained and sent to pathology.  All specimens were labelled and sent to pathology.  Patient was given post procedure instructions.  Will follow up pathology and manage accordingly.  Routine preventative health maintenance measures emphasized.    Verita Schneiders, MD, [place] Attending [place] for [employer], [place]

## 2015-03-04 NOTE — Patient Instructions (Signed)

## 2015-03-09 ENCOUNTER — Telehealth: Payer: Self-pay | Admitting: *Deleted

## 2015-03-09 NOTE — Telephone Encounter (Deleted)
-----   Message from Osborne Oman, MD sent at 03/09/2015  1:17 PM EDT ----- Repeat cotesting in six month given suspicion of possible high grade lesion as seen on ECC.  Benign endometrial biopsy.  Please call to inform patient of results and recommendations.

## 2015-03-09 NOTE — Telephone Encounter (Signed)
Called [name] as requested by message from Dr. Harolyn Rutherford. Informed her of results of endometrial biopsy negative and reccomendation to have pap with cotesting in 6 months  Due to high suspiscion of high grade on ECC . Advised patient to call  2 months ahead for appointment. She also c/o still having some spotting/ light bleeding since biopsy. I informed her to call if bleeding continues past 2 weeks. She voices understanding.

## 2015-03-21 ENCOUNTER — Telehealth: Payer: Self-pay | Admitting: General Practice

## 2015-03-21 NOTE — Telephone Encounter (Signed)
Patient called and left message stating she had a procedure done on 7/22 and she is still bleeding. Called patient and she states she is still bleeding following her colposcopy. Patient states bleeding is heavy like a period. States she has not got her period since on the depo shot. Per Dr Roselie Awkward may work patient in for sooner appt for follow up. Appt made for 8/10 @ 1pm and patient informed. Patient verbalized understanding and had no questions

## 2015-03-23 ENCOUNTER — Ambulatory Visit (INDEPENDENT_AMBULATORY_CARE_PROVIDER_SITE_OTHER): Payer: Medicaid Other | Admitting: Obstetrics & Gynecology

## 2015-03-23 ENCOUNTER — Encounter: Payer: Self-pay | Admitting: Obstetrics & Gynecology

## 2015-03-23 VITALS — BP 157/108 | HR 88 | Temp 98.6°F | Wt 233.0 lb

## 2015-03-23 DIAGNOSIS — R87612 Low grade squamous intraepithelial lesion on cytologic smear of cervix (LGSIL): Secondary | ICD-10-CM | POA: Diagnosis not present

## 2015-03-23 DIAGNOSIS — N938 Other specified abnormal uterine and vaginal bleeding: Secondary | ICD-10-CM

## 2015-03-23 DIAGNOSIS — R87619 Unspecified abnormal cytological findings in specimens from cervix uteri: Secondary | ICD-10-CM

## 2015-03-23 MED ORDER — MEDROXYPROGESTERONE ACETATE 150 MG/ML IM SUSP
150.0000 mg | Freq: Once | INTRAMUSCULAR | Status: AC
  Administered 2015-03-23: 150 mg via INTRAMUSCULAR

## 2015-03-23 NOTE — Progress Notes (Signed)
   CLINIC ENCOUNTER NOTE  History:  39 y.o. [id number] here today for evaluation of bleeding after colposcopy and endometrial biopsy on 03/04/15.  Reports heavy bleeding since having the endometrial biopsy; no symptoms of anemia. She denies any abnormal pelvic pain or other concerns.   Past Medical History  Diagnosis Date  . Anemia   . Enlarged heart     managed by cardiology  . Hypertension   . Lupus 1999  . Allergy   . Gestational diabetes   . Heart murmur   . Infection     UTI  . Depression     after losses  . Fibroid   . Abnormal Pap smear of cervix     colpo, HPV    Past Surgical History  Procedure Laterality Date  . Therapeutic abortion      The following portions of the patient's history were reviewed and updated as appropriate: allergies, current medications, past family history, past medical history, past social history, past surgical history and problem list.   Health Maintenance:  LGSIL - encompassing HPV,mild dysplasia,CIN I) with atypical glandular cells on pap smear on 01/20/2015  Review of Systems:  Pertinent items are noted in HPI. Comprehensive review of systems was otherwise negative.  Objective:  Physical Exam BP 157/108 mmHg  Pulse 88  Temp(Src) 98.6 F (37 C)  Wt 233 lb (105.688 kg)  LMP 02/02/2015 CONSTITUTIONAL: Well-developed, well-nourished female in no acute distress.  HENT:  Normocephalic, atraumatic. External right and left ear normal. Oropharynx is clear and moist EYES: Conjunctivae and EOM are normal. Pupils are equal, round, and reactive to light. No scleral icterus.  NECK: Normal range of motion, supple, no masses SKIN: Skin is warm and dry. No rash noted. Not diaphoretic. No erythema. No pallor. [place]: Alert and oriented to person, place, and time. Normal reflexes, muscle tone coordination. No cranial nerve deficit noted. PSYCHIATRIC: Normal mood and affect. Normal behavior. Normal judgment and thought content. CARDIOVASCULAR: Normal  heart rate noted RESPIRATORY: Effort and breath sounds normal, no problems with respiration noted ABDOMEN: Soft, no distention noted.   PELVIC: Normal appearing external genitalia; normal appearing vaginal mucosa and cervix.  Small amount of blood in vault, no active bleeding. No abnormal discharge noted.   MUSCULOSKELETAL: Normal range of motion. No edema noted.  Colposcopy and Endometrial Biopsy Pathology 03/04/2015 1. Endocervix, curettage - SCANT DETACHED FRAGMENT OF DYSPLASTIC SQUAMOUS EPITHELIUM, SEE COMMENT. - BENIGN ENDOCERVICAL EPITHELIUM. 2. Endometrium, biopsy - INACTIVE ENDOMETRIUM WITH PSEUDODECIDUALIZATION CONSISTENT WITH PROGESTIN EFFECT. - NO HYPERPLASIA OR MALIGNANCY.  Assessment & Plan:  Bleeding after endometrial biopsy.  Patient reassured. To help with endometrial stability, recommended getting Depo Provera dose earlier (due Aug 25).  Dose given today, will monitor response. Repeat cotesting in six month given suspicion of possible high grade lesion as seen on ECC  Please refer to After Visit Summary for other counseling recommendations.    Verita Schneiders, MD, [place] Attending [place] for [employer], [place]

## 2015-03-23 NOTE — Patient Instructions (Signed)
Return to clinic for any scheduled appointments or for any gynecologic concerns as needed.   

## 2015-03-23 NOTE — Progress Notes (Signed)
Patient ID: [name], female   DOB: March 29, 1976, 39 y.o.   MRN: [id number] Pt states she took blood pressure medication 25 mins ago.  Pt states she has been heavy bleeding since colposcopy on 7/22.

## 2015-04-07 ENCOUNTER — Ambulatory Visit: Payer: Medicaid Other

## 2015-05-28 ENCOUNTER — Other Ambulatory Visit: Payer: Self-pay | Admitting: Obstetrics & Gynecology

## 2015-06-08 ENCOUNTER — Ambulatory Visit (INDEPENDENT_AMBULATORY_CARE_PROVIDER_SITE_OTHER): Payer: Medicaid Other

## 2015-06-08 VITALS — BP 149/98 | HR 92 | Wt 233.3 lb

## 2015-06-08 DIAGNOSIS — Z3202 Encounter for pregnancy test, result negative: Secondary | ICD-10-CM | POA: Diagnosis present

## 2015-06-08 DIAGNOSIS — Z3042 Encounter for surveillance of injectable contraceptive: Secondary | ICD-10-CM | POA: Diagnosis not present

## 2015-06-08 MED ORDER — MEDROXYPROGESTERONE ACETATE 150 MG/ML IM SUSP
150.0000 mg | Freq: Once | INTRAMUSCULAR | Status: AC
  Administered 2015-06-08: 150 mg via INTRAMUSCULAR

## 2015-08-24 ENCOUNTER — Ambulatory Visit (INDEPENDENT_AMBULATORY_CARE_PROVIDER_SITE_OTHER): Payer: Medicaid Other | Admitting: *Deleted

## 2015-08-24 VITALS — BP 165/97 | HR 93

## 2015-08-24 DIAGNOSIS — N921 Excessive and frequent menstruation with irregular cycle: Secondary | ICD-10-CM

## 2015-08-24 DIAGNOSIS — Z3042 Encounter for surveillance of injectable contraceptive: Secondary | ICD-10-CM | POA: Diagnosis present

## 2015-08-24 MED ORDER — MEDROXYPROGESTERONE ACETATE 150 MG/ML IM SUSP
150.0000 mg | Freq: Once | INTRAMUSCULAR | Status: AC
  Administered 2015-08-24: 150 mg via INTRAMUSCULAR

## 2015-08-24 NOTE — Progress Notes (Signed)
Here for depo-provera. States hasn't taken bp med yesterday or today because ran out, will pick up today.

## 2015-10-07 ENCOUNTER — Encounter (HOSPITAL_COMMUNITY): Payer: Self-pay | Admitting: *Deleted

## 2015-10-07 ENCOUNTER — Inpatient Hospital Stay (HOSPITAL_COMMUNITY)
Admission: AD | Admit: 2015-10-07 | Discharge: 2015-10-07 | Disposition: A | Discharge status: Home / Self Care | Payer: BLUE CROSS/BLUE SHIELD | Source: Ambulatory Visit | Attending: Family Medicine | Admitting: Family Medicine

## 2015-10-07 DIAGNOSIS — N760 Acute vaginitis: Secondary | ICD-10-CM

## 2015-10-07 DIAGNOSIS — F329 Major depressive disorder, single episode, unspecified: Secondary | ICD-10-CM | POA: Insufficient documentation

## 2015-10-07 DIAGNOSIS — I517 Cardiomegaly: Secondary | ICD-10-CM | POA: Insufficient documentation

## 2015-10-07 DIAGNOSIS — M329 Systemic lupus erythematosus, unspecified: Secondary | ICD-10-CM | POA: Diagnosis not present

## 2015-10-07 DIAGNOSIS — I10 Essential (primary) hypertension: Secondary | ICD-10-CM | POA: Diagnosis not present

## 2015-10-07 DIAGNOSIS — B9689 Other specified bacterial agents as the cause of diseases classified elsewhere: Secondary | ICD-10-CM

## 2015-10-07 DIAGNOSIS — A499 Bacterial infection, unspecified: Secondary | ICD-10-CM

## 2015-10-07 DIAGNOSIS — L292 Pruritus vulvae: Secondary | ICD-10-CM | POA: Diagnosis present

## 2015-10-07 LAB — URINE MICROSCOPIC-ADD ON

## 2015-10-07 LAB — URINALYSIS, ROUTINE W REFLEX MICROSCOPIC
Bilirubin Urine: NEGATIVE
Glucose, UA: NEGATIVE mg/dL
Ketones, ur: 15 mg/dL — AB
Nitrite: NEGATIVE
Protein, ur: NEGATIVE mg/dL
Specific Gravity, Urine: 1.02 (ref 1.005–1.030)
pH: 6 (ref 5.0–8.0)

## 2015-10-07 LAB — WET PREP, GENITAL
Sperm: NONE SEEN
Trich, Wet Prep: NONE SEEN
Yeast Wet Prep HPF POC: NONE SEEN

## 2015-10-07 LAB — POCT PREGNANCY, URINE: Preg Test, Ur: NEGATIVE

## 2015-10-07 MED ORDER — METRONIDAZOLE 500 MG PO TABS: 500.0000 mg | ORAL_TABLET | Freq: Two times a day (BID) | ORAL | Status: DC

## 2015-10-07 NOTE — MAU Note (Addendum)
Onset of vaginal discharge with odor, vaginal itching and frequent urination x 1 week.  Patient on DepoProvera. Patient on blood pressure medication took only about 30 minutes.

## 2015-10-07 NOTE — Discharge Instructions (Signed)

## 2015-10-07 NOTE — MAU Provider Note (Signed)
Chief Complaint: Vaginal Itching; Urinary Frequency; and Vaginal Discharge   First Provider Initiated Contact with Patient 10/07/15 1516      SUBJECTIVE HPI: [name] is a 40 y.o. [id number] who presents to maternity admissions reporting vaginal itching, increased thin white discharge with odor, urinary frequency and dysuria x 1 week.  She reports her symptoms are similar to when she had bacterial vaginosis in the past.  She has tried washing frequently with soap for the discharge and odor but symptoms return.  She has not tried any other treatments. It is not associated with other symptoms, and she denies pain.  Pt took her BP medication 30 minutes before arrival to MAU. She has appointment with primary care next week to follow up on recent changes to blood pressure medicine. She denies vaginal bleeding, vaginal itching/burning, h/a, dizziness, n/v, or fever/chills.     HPI  Past Medical History  Diagnosis Date  . Anemia   . Enlarged heart     managed by cardiology  . Hypertension   . Lupus ([place]) 1999  . Allergy   . Gestational diabetes   . Heart murmur   . Infection     UTI  . Depression     after losses  . Fibroid   . Abnormal Pap smear of cervix     colpo, HPV   Past Surgical History  Procedure Laterality Date  . Therapeutic abortion     Social History   Social History  . Marital Status: Married    Spouse Name: N/A  . Number of Children: N/A  . Years of Education: N/A   Occupational History  . Not on file.   Social History Main Topics  . Smoking status: Never Smoker   . Smokeless tobacco: Never Used  . Alcohol Use: Yes     Comment: rarely  . Drug Use: No  . Sexual Activity: Yes    Birth Control/ Protection: None, Injection   Other Topics Concern  . Not on file   Social History Narrative   No current facility-administered medications on file prior to encounter.   Current Outpatient Prescriptions on File Prior to Encounter  Medication Sig Dispense  Refill  . albuterol (PROVENTIL HFA;VENTOLIN HFA) 108 (90 BASE) MCG/ACT inhaler Inhale 2 puffs into the lungs every 6 (six) hours as needed for wheezing or shortness of breath.    Marland Kitchen amLODipine (NORVASC) 10 MG tablet Take 1 tablet (10 mg total) by mouth daily. 30 tablet 0  . cloNIDine (CATAPRES) 0.2 MG tablet Take 0.2 mg by mouth 2 (two) times daily.    . cloNIDine (CATAPRES) 0.3 MG tablet Take 0.3 mg by mouth 2 (two) times daily.    Marland Kitchen docusate sodium (COLACE) 100 MG capsule Take 1 capsule (100 mg total) by mouth 2 (two) times daily as needed. 30 capsule 2  . hydroxychloroquine (PLAQUENIL) 200 MG tablet Take 200 mg by mouth 2 (two) times daily.    Marland Kitchen ibuprofen (ADVIL,MOTRIN) 600 MG tablet TAKE 1 TABLET (600 MG TOTAL) BY MOUTH EVERY 6 (SIX) HOURS AS NEEDED. 60 tablet 2  . medroxyPROGESTERone (DEPO-PROVERA) 150 MG/ML injection Inject 150 mg into the muscle every 3 (three) months.    . metFORMIN (GLUCOPHAGE) 500 MG tablet Take 1 tablet (500 mg total) by mouth 2 (two) times daily with a meal. 60 tablet 1  . [DISCONTINUED] fexofenadine (ALLEGRA) 180 MG tablet Take 180 mg by mouth daily.     Allergies  Allergen Reactions  . Septra [Bactrim] Hives  ROS:  Review of Systems  Constitutional: Negative for fever, chills and fatigue.  Respiratory: Negative for shortness of breath.   Cardiovascular: Negative for chest pain.  Genitourinary: Positive for dysuria, frequency and vaginal discharge. Negative for flank pain, vaginal bleeding, difficulty urinating, vaginal pain and pelvic pain.  Neurological: Negative for dizziness and headaches.  Psychiatric/Behavioral: Negative.      I have reviewed patient's Past Medical Hx, Surgical Hx, Family Hx, Social Hx, medications and allergies.   Physical Exam   Patient Vitals for the past 24 hrs:  BP Temp Temp src Pulse Resp Height Weight  10/07/15 1427 148/96 mmHg 98 F (36.7 C) Oral 95 18 5' (1.524 m) 230 lb 9.6 oz (104.599 kg)   Constitutional:  Well-developed, well-nourished female in no acute distress.  Cardiovascular: normal rate Respiratory: normal effort GI: Abd soft, non-tender. Pos BS x 4 MS: Extremities nontender, no edema, normal ROM Neurologic: Alert and oriented x 4.  GU: Neg CVAT.  PELVIC EXAM: Cervix pink, visually closed, without lesion, scant thin white discharge, vaginal walls and external genitalia normal   LAB RESULTS Results for orders placed or performed during the hospital encounter of 10/07/15 (from the past 24 hour(s))  Urinalysis, Routine w reflex microscopic (not at [facility])     Status: Abnormal   Collection Time: 10/07/15  2:30 PM  Result Value Ref Range   Color, Urine YELLOW YELLOW   APPearance CLEAR CLEAR   Specific Gravity, Urine 1.020 1.005 - 1.030   pH 6.0 5.0 - 8.0   Glucose, UA NEGATIVE NEGATIVE mg/dL   Hgb urine dipstick MODERATE (A) NEGATIVE   Bilirubin Urine NEGATIVE NEGATIVE   Ketones, ur 15 (A) NEGATIVE mg/dL   Protein, ur NEGATIVE NEGATIVE mg/dL   Nitrite NEGATIVE NEGATIVE   Leukocytes, UA SMALL (A) NEGATIVE  Urine microscopic-add on     Status: Abnormal   Collection Time: 10/07/15  2:30 PM  Result Value Ref Range   Squamous Epithelial / LPF 0-5 (A) NONE SEEN   WBC, UA 0-5 0 - 5 WBC/hpf   RBC / HPF 0-5 0 - 5 RBC/hpf   Bacteria, UA FEW (A) NONE SEEN  Pregnancy, urine POC     Status: None   Collection Time: 10/07/15  2:39 PM  Result Value Ref Range   Preg Test, Ur NEGATIVE NEGATIVE  Wet prep, genital     Status: Abnormal   Collection Time: 10/07/15  3:00 PM  Result Value Ref Range   Yeast Wet Prep HPF POC NONE SEEN NONE SEEN   Trich, Wet Prep NONE SEEN NONE SEEN   Clue Cells Wet Prep HPF POC PRESENT (A) NONE SEEN   WBC, Wet Prep HPF POC FEW (A) NONE SEEN   Sperm NONE SEEN        IMAGING No results found.  MAU Management/MDM: Ordered labs and reviewed results.  Will treat BV with Flagyl 500 mg BID x 7 days.  Pt stable at time of discharge.  ASSESSMENT 1. BV (bacterial  vaginosis)   2. Essential hypertension     PLAN Discharge home Rx for Flagyl F/U in [place] F/U with primary care next week as scheduled    Medication List    TAKE these medications        albuterol 108 (90 Base) MCG/ACT inhaler  Commonly known as:  PROVENTIL HFA;VENTOLIN HFA  Inhale 2 puffs into the lungs every 6 (six) hours as needed for wheezing or shortness of breath.     amLODipine 10 MG tablet  Commonly known as:  NORVASC  Take 1 tablet (10 mg total) by mouth daily.     cloNIDine 0.2 MG tablet  Commonly known as:  CATAPRES  Take 0.2 mg by mouth 2 (two) times daily.     cloNIDine 0.3 MG tablet  Commonly known as:  CATAPRES  Take 0.3 mg by mouth 2 (two) times daily.     docusate sodium 100 MG capsule  Commonly known as:  COLACE  Take 1 capsule (100 mg total) by mouth 2 (two) times daily as needed.     hydroxychloroquine 200 MG tablet  Commonly known as:  PLAQUENIL  Take 200 mg by mouth 2 (two) times daily.     ibuprofen 600 MG tablet  Commonly known as:  ADVIL,MOTRIN  TAKE 1 TABLET (600 MG TOTAL) BY MOUTH EVERY 6 (SIX) HOURS AS NEEDED.     medroxyPROGESTERone 150 MG/ML injection  Commonly known as:  DEPO-PROVERA  Inject 150 mg into the muscle every 3 (three) months.     metFORMIN 500 MG tablet  Commonly known as:  GLUCOPHAGE  Take 1 tablet (500 mg total) by mouth 2 (two) times daily with a meal.     metroNIDAZOLE 500 MG tablet  Commonly known as:  FLAGYL  Take 1 tablet (500 mg total) by mouth 2 (two) times daily.       Follow-up Information    Follow up with [facility]   Specialty:  Obstetrics and Gynecology   Why:  For routine gyn care, Return to MAU as needed for emergencies   Contact information:   [place] Stigler Andrews Certified Nurse-Midwife 10/07/2015  3:54 PM

## 2015-10-08 LAB — HIV ANTIBODY (ROUTINE TESTING W REFLEX): HIV Screen 4th Generation wRfx: NONREACTIVE

## 2015-10-08 LAB — RPR: RPR Ser Ql: NONREACTIVE

## 2015-10-09 LAB — URINE CULTURE

## 2015-10-10 LAB — GC/CHLAMYDIA PROBE AMP (~~LOC~~) NOT AT ARMC
Chlamydia: NEGATIVE
Neisseria Gonorrhea: NEGATIVE

## 2015-11-16 ENCOUNTER — Ambulatory Visit (INDEPENDENT_AMBULATORY_CARE_PROVIDER_SITE_OTHER): Payer: BLUE CROSS/BLUE SHIELD | Admitting: *Deleted

## 2015-11-16 VITALS — BP 134/90 | HR 88

## 2015-11-16 DIAGNOSIS — N921 Excessive and frequent menstruation with irregular cycle: Secondary | ICD-10-CM | POA: Diagnosis not present

## 2015-11-16 MED ORDER — MEDROXYPROGESTERONE ACETATE 150 MG/ML IM SUSP
150.0000 mg | Freq: Once | INTRAMUSCULAR | Status: AC
  Administered 2015-11-16: 150 mg via INTRAMUSCULAR

## 2016-02-01 ENCOUNTER — Ambulatory Visit (INDEPENDENT_AMBULATORY_CARE_PROVIDER_SITE_OTHER): Payer: Medicaid Other | Admitting: General Practice

## 2016-02-01 VITALS — BP 128/76 | HR 83 | Ht 60.0 in | Wt 237.0 lb

## 2016-02-01 DIAGNOSIS — Z30013 Encounter for initial prescription of injectable contraceptive: Secondary | ICD-10-CM | POA: Diagnosis present

## 2016-02-01 DIAGNOSIS — Z3042 Encounter for surveillance of injectable contraceptive: Secondary | ICD-10-CM

## 2016-04-18 ENCOUNTER — Ambulatory Visit: Payer: Medicaid Other

## 2016-04-18 ENCOUNTER — Ambulatory Visit (INDEPENDENT_AMBULATORY_CARE_PROVIDER_SITE_OTHER): Payer: Medicaid Other | Admitting: *Deleted

## 2016-04-18 VITALS — BP 130/77 | HR 84

## 2016-04-18 DIAGNOSIS — Z3042 Encounter for surveillance of injectable contraceptive: Secondary | ICD-10-CM

## 2016-04-18 MED ORDER — MEDROXYPROGESTERONE ACETATE 150 MG/ML IM SUSP
150.0000 mg | Freq: Once | INTRAMUSCULAR | Status: AC
  Administered 2016-04-18: 150 mg via INTRAMUSCULAR

## 2016-06-05 ENCOUNTER — Encounter (HOSPITAL_COMMUNITY): Payer: Self-pay | Admitting: Emergency Medicine

## 2016-06-05 ENCOUNTER — Ambulatory Visit (HOSPITAL_COMMUNITY)
Admission: EM | Admit: 2016-06-05 | Discharge: 2016-06-05 | Disposition: A | Discharge status: Home / Self Care | Payer: Medicaid Other | Attending: Family Medicine | Admitting: Family Medicine

## 2016-06-05 DIAGNOSIS — K029 Dental caries, unspecified: Secondary | ICD-10-CM

## 2016-06-05 DIAGNOSIS — K047 Periapical abscess without sinus: Secondary | ICD-10-CM

## 2016-06-05 MED ORDER — HYDROCODONE-ACETAMINOPHEN 7.5-325 MG PO TABS: 1.0000 | ORAL_TABLET | Freq: Four times a day (QID) | ORAL | 0 refills | Status: DC | PRN

## 2016-06-05 MED ORDER — CLINDAMYCIN HCL 300 MG PO CAPS: 300.0000 mg | ORAL_CAPSULE | Freq: Three times a day (TID) | ORAL | 0 refills | Status: DC

## 2016-06-05 NOTE — ED Triage Notes (Signed)
Left side of face swelling and pain, ear pain, reports having a tooth with a hole in it.  Pain into left ear and lymph nodes are sore

## 2016-06-05 NOTE — ED Provider Notes (Signed)
CSN: [id number]     Arrival date & time 06/05/16  1825 History   First MD Initiated Contact with Patient 06/05/16 1839     Chief Complaint  Patient presents with  . Dental Pain   (Consider location/radiation/quality/duration/timing/severity/associated sxs/prior Treatment) HPI NP 40 Y/O FEMALE WITH PAIN AND SWELLING OF THE LEFT FACE FOR THE LAST WEEK. OTC MEDS NOT HELPING. HAS INSURANCE NOV 1 AND PLANS TO SEE DENTIST AT THAT TIME.  Past Medical History:  Diagnosis Date  . Abnormal Pap smear of cervix    colpo, HPV  . Allergy   . Anemia   . Depression    after losses  . Enlarged heart    managed by cardiology  . Fibroid   . Gestational diabetes   . Heart murmur   . Hypertension   . Infection    UTI  . Lupus 1999   Past Surgical History:  Procedure Laterality Date  . THERAPEUTIC ABORTION     Family History  Problem Relation Age of Onset  . Cancer Maternal Grandfather     lung  . Diabetes Mother   . Diabetes Maternal Uncle   . Hypertension Maternal Grandmother   . Diabetes Father   . Hearing loss Neg Hx    Social History  Substance Use Topics  . Smoking status: Never Smoker  . Smokeless tobacco: Never Used  . Alcohol use Yes     Comment: rarely   OB History    Gravida Para Term Preterm AB Living   5 2 0 2 3 2    SAB TAB Ectopic Multiple Live Births   2 1 0 0 2     Review of Systems  Denies: HEADACHE, NAUSEA, ABDOMINAL PAIN, CHEST PAIN, CONGESTION, DYSURIA, SHORTNESS OF BREATH  Allergies  Septra [bactrim]  Home Medications   Prior to Admission medications   Medication Sig Start Date End Date Taking? Authorizing Provider  amLODipine (NORVASC) 10 MG tablet Take 1 tablet (10 mg total) by mouth daily. 03/31/12  Yes Clayton Bibles, PA-C  cloNIDine (CATAPRES) 0.3 MG tablet Take 0.3 mg by mouth 2 (two) times daily.   Yes Historical Provider, MD  hydroxychloroquine (PLAQUENIL) 200 MG tablet Take 200 mg by mouth 2 (two) times daily.   Yes Historical Provider, MD   metFORMIN (GLUCOPHAGE) 500 MG tablet Take 1 tablet (500 mg total) by mouth 2 (two) times daily with a meal. 12/25/13  Yes Lucila Maine, PA-C  albuterol (PROVENTIL HFA;VENTOLIN HFA) 108 (90 BASE) MCG/ACT inhaler Inhale 2 puffs into the lungs every 6 (six) hours as needed for wheezing or shortness of breath.    Historical Provider, MD  clindamycin (CLEOCIN) 300 MG capsule Take 1 capsule (300 mg total) by mouth 3 (three) times daily. 06/05/16   Konrad Felix, PA  cloNIDine (CATAPRES) 0.2 MG tablet Take 0.2 mg by mouth 2 (two) times daily.    Historical Provider, MD  docusate sodium (COLACE) 100 MG capsule Take 1 capsule (100 mg total) by mouth 2 (two) times daily as needed. 01/20/15   Osborne Oman, MD  HYDROcodone-acetaminophen (NORCO) 7.5-325 MG tablet Take 1 tablet by mouth every 6 (six) hours as needed for moderate pain. 06/05/16   Konrad Felix, PA  ibuprofen (ADVIL,MOTRIN) 600 MG tablet TAKE 1 TABLET (600 MG TOTAL) BY MOUTH EVERY 6 (SIX) HOURS AS NEEDED. 05/29/15   Osborne Oman, MD  medroxyPROGESTERone (DEPO-PROVERA) 150 MG/ML injection Inject 150 mg into the muscle every 3 (three) months.  Historical Provider, MD   Meds Ordered and Administered this Visit  Medications - No data to display  BP 165/97 (BP Location: Left Arm) Comment (BP Location): large cuff  Pulse 98   Temp 98.7 F (37.1 C) (Oral)   Resp 22   SpO2 100%  No data found.   Physical Exam NURSES NOTES AND VITAL SIGNS REVIEWED. CONSTITUTIONAL: Well developed, well nourished, no acute distress HEENT: normocephalic, atraumatic, LEFT FACIAL SWELLING, TENDER MOLAR LEFT LOWER SIDE. TEETH ARE IN FAIR REPAIR.  EYES: Conjunctiva normal NECK:normal ROM, supple, no adenopathy PULMONARY:No respiratory distress, normal effort ABDOMINAL: Soft, ND, NT BS+, No CVAT MUSCULOSKELETAL: Normal ROM of all extremities,  SKIN: warm and dry without rash PSYCHIATRIC: Mood and affect, behavior are normal  Urgent Care Course    Clinical Course    Procedures (including critical care time)  Labs Review Labs Reviewed - No data to display  Imaging Review No results found.   Visual Acuity Review  Right Eye Distance:   Left Eye Distance:   Bilateral Distance:    Right Eye Near:   Left Eye Near:    Bilateral Near:         MDM   1. Pain due to dental caries   2. Infected dental caries     Patient is reassured that there are no issues that require transfer to higher level of care at this time or additional tests. Patient is advised to continue home symptomatic treatment. Patient is advised that if there are new or worsening symptoms to attend the emergency department, contact primary care provider, or return to UC. Instructions of care provided discharged home in stable condition.    THIS NOTE WAS GENERATED USING A VOICE RECOGNITION SOFTWARE PROGRAM. ALL REASONABLE EFFORTS  WERE MADE TO PROOFREAD THIS DOCUMENT FOR ACCURACY.  I have verbally reviewed the discharge instructions with the patient. A printed AVS was given to the patient.  All questions were answered prior to discharge.      Konrad Felix, PA 06/05/16 2042

## 2016-07-11 ENCOUNTER — Ambulatory Visit: Payer: Self-pay

## 2016-07-17 ENCOUNTER — Ambulatory Visit: Payer: Medicaid Other

## 2016-07-18 ENCOUNTER — Ambulatory Visit (INDEPENDENT_AMBULATORY_CARE_PROVIDER_SITE_OTHER): Payer: Medicaid Other | Admitting: *Deleted

## 2016-07-18 VITALS — BP 156/93 | HR 85 | Wt 244.1 lb

## 2016-07-18 DIAGNOSIS — Z3042 Encounter for surveillance of injectable contraceptive: Secondary | ICD-10-CM | POA: Diagnosis not present

## 2016-07-18 MED ORDER — MEDROXYPROGESTERONE ACETATE 150 MG/ML IM SUSP
150.0000 mg | Freq: Once | INTRAMUSCULAR | Status: AC
  Administered 2016-07-18: 150 mg via INTRAMUSCULAR

## 2016-07-18 NOTE — Progress Notes (Signed)
Pt denies H/A and states that she took her blood pressure medicine about 1 hour ago. She sees Dr. Alyson Ingles for her medical issues and saw hin last on 11/30. Depo Provera given as scheduled and tolerated well.  Next due 2/21-10/17/16.  Pt also advised that she needs annual exam and Pap w/cotesting as that has not been performed since her Colposcopy in August 2016. Pt vocied understanding and agreed to schedule appt as recommended.

## 2016-08-01 ENCOUNTER — Telehealth: Payer: Self-pay | Admitting: *Deleted

## 2016-08-01 NOTE — Telephone Encounter (Signed)
Patient left message, thinks she may have BV, please call.

## 2016-08-03 NOTE — Telephone Encounter (Signed)
Returned patient call. No answer, left voice mail stating I am returning her call. We are currently closed through Christmas. We will try to call again next week.

## 2016-08-08 NOTE — Telephone Encounter (Signed)
Called pt and left message stating that I am following up on her call from last week. If she is still having a problem or has questions, she may call back and leave a new message.

## 2016-09-05 ENCOUNTER — Ambulatory Visit: Payer: Medicaid Other | Admitting: Advanced Practice Midwife

## 2016-09-26 ENCOUNTER — Other Ambulatory Visit (HOSPITAL_COMMUNITY)
Admission: RE | Admit: 2016-09-26 | Discharge: 2016-09-26 | Disposition: A | Discharge status: Home / Self Care | Payer: Medicaid Other | Source: Ambulatory Visit | Attending: Certified Nurse Midwife | Admitting: Certified Nurse Midwife

## 2016-09-26 ENCOUNTER — Ambulatory Visit (INDEPENDENT_AMBULATORY_CARE_PROVIDER_SITE_OTHER): Payer: Medicaid Other | Admitting: Certified Nurse Midwife

## 2016-09-26 ENCOUNTER — Other Ambulatory Visit: Payer: Self-pay | Admitting: Certified Nurse Midwife

## 2016-09-26 ENCOUNTER — Encounter: Payer: Self-pay | Admitting: Certified Nurse Midwife

## 2016-09-26 VITALS — BP 148/83 | HR 87 | Wt 232.0 lb

## 2016-09-26 DIAGNOSIS — Z309 Encounter for contraceptive management, unspecified: Secondary | ICD-10-CM | POA: Diagnosis not present

## 2016-09-26 DIAGNOSIS — Z01411 Encounter for gynecological examination (general) (routine) with abnormal findings: Secondary | ICD-10-CM | POA: Insufficient documentation

## 2016-09-26 DIAGNOSIS — Z0000 Encounter for general adult medical examination without abnormal findings: Secondary | ICD-10-CM

## 2016-09-26 DIAGNOSIS — N898 Other specified noninflammatory disorders of vagina: Secondary | ICD-10-CM

## 2016-09-26 DIAGNOSIS — Z01419 Encounter for gynecological examination (general) (routine) without abnormal findings: Secondary | ICD-10-CM | POA: Diagnosis not present

## 2016-09-26 DIAGNOSIS — Z1231 Encounter for screening mammogram for malignant neoplasm of breast: Secondary | ICD-10-CM

## 2016-09-26 DIAGNOSIS — Z1151 Encounter for screening for human papillomavirus (HPV): Secondary | ICD-10-CM | POA: Insufficient documentation

## 2016-09-26 NOTE — Progress Notes (Signed)
Subjective:     [name] is a 41 y.o. female here for a routine exam. Current complaints: vaginal itching.  She reports sx started 1.5 weeks ago. Irritation is present. No discharge or odor. Denies new partner. Admits to using new feminine soap prior.    Gynecologic History No LMP recorded. Patient has had an injection. Contraception: Depo-Provera injections Last Pap: 2016. Results were: abnormal: LGSIL - encompassing HPV,mild dysplasia,CIN I with atypical glandular cells >EMB negative, possible high grade lesion seen on ECC Last mammogram: never  Obstetric History OB History  Gravida Para Term Preterm AB Living  5 2 0 2 3 2   SAB TAB Ectopic Multiple Live Births  2 1 0 0 2    # Outcome Date GA Lbr Len/2nd Weight Sex Delivery Anes PTL Lv  5 SAB           4 SAB  [redacted]w[redacted]d   M         Birth Comments: iufd  3 TAB           2 Preterm  [redacted]w[redacted]d 17:00     Y LIV  1 Preterm  [redacted]w[redacted]d 06:00 6 lb 3 oz (2.807 kg) M Vag-Spont None Y LIV     The following portions of the patient's history were reviewed and updated as appropriate: allergies, current medications, past family history, past medical history, past social history, past surgical history and problem list.  Review of Systems Pertinent items are noted in HPI.   +vaginal itching No vaginal discharge  Objective:    BP (!) 148/83   Pulse 87   Wt 232 lb (105.2 kg)   BMI 45.31 kg/m   General Appearance:    Alert, cooperative, no distress, appears stated age  Head:    Normocephalic, without obvious abnormality, atraumatic  Eyes:     Ears:      Nose:   Nares normal  Throat:   Lips, mucosa, and tongue normal; teeth and gums normal  Neck:   Supple, symmetrical, trachea midline, no adenopathy;    thyroid:  no enlargement/tenderness/nodules; no carotid   bruit or JVD  Back:     Symmetric, no curvature, ROM normal  Lungs:     respirations unlabored and regular  Chest Wall:       Heart:    Regular rate and rhythm, S1 and S2 normal, no  murmur, rub   or gallop  Breast Exam:    No tenderness, masses, or nipple abnormality  Abdomen:     Soft, non-tender,   no masses, no organomegaly  Genitalia:    Normal female without lesion, discharge or tenderness  Rectal:      Extremities:   Extremities normal, atraumatic, no cyanosis or edema  Pulses:     Skin:   Skin color, texture, no rashes or lesions  Lymph nodes:   Cervical, supraclavicular, and axillary nodes normal  Neurologic:   CNII-XII intact      Assessment:    Healthy female exam.   Vaginal itching Plan:  Pap today Wet prep GC/CT cultures  Contraception: Depo-Provera injections. Mammogram ordered. Follow up in: 1 year.

## 2016-09-27 LAB — CERVICOVAGINAL ANCILLARY ONLY
Bacterial vaginitis: POSITIVE — AB
Candida vaginitis: NEGATIVE
Trichomonas: NEGATIVE

## 2016-09-28 LAB — CYTOLOGY - PAP: HPV: DETECTED — AB

## 2016-10-02 ENCOUNTER — Telehealth: Payer: Self-pay

## 2016-10-02 MED ORDER — METRONIDAZOLE 500 MG PO TABS: 500.0000 mg | ORAL_TABLET | Freq: Two times a day (BID) | ORAL | 0 refills | Status: DC

## 2016-10-02 NOTE — Telephone Encounter (Signed)
Called patient no answer. I have sent patient medicine to her pharmacy flagyl.  Pap with LSIL and +HPV, needs colpo-please schedule with provider who does these. Also needs Flagyl 500 bid x7 days for +BV.

## 2016-10-03 ENCOUNTER — Ambulatory Visit (INDEPENDENT_AMBULATORY_CARE_PROVIDER_SITE_OTHER): Payer: Medicaid Other | Admitting: *Deleted

## 2016-10-03 VITALS — BP 155/83 | HR 99 | Wt 230.7 lb

## 2016-10-03 DIAGNOSIS — Z3042 Encounter for surveillance of injectable contraceptive: Secondary | ICD-10-CM

## 2016-10-03 NOTE — Telephone Encounter (Signed)
Pt in office for Depo Provera injection today.  She was informed of Rx sent to pharmacy and need for Colpo. She voiced understanding.

## 2016-10-03 NOTE — Progress Notes (Signed)
Depo Provera administered as scheduled. Next injection due 5/9-5/23.  Pt informed of test results from last week including BV and Colpo needed due to abnormal Pap. Rx sent to pharmacy to treat BV.  She voiced understanding.

## 2016-10-09 ENCOUNTER — Ambulatory Visit: Payer: Medicaid Other

## 2016-10-25 ENCOUNTER — Ambulatory Visit: Payer: Medicaid Other | Admitting: Obstetrics & Gynecology

## 2016-12-19 ENCOUNTER — Ambulatory Visit (INDEPENDENT_AMBULATORY_CARE_PROVIDER_SITE_OTHER): Payer: Medicaid Other | Admitting: *Deleted

## 2016-12-19 ENCOUNTER — Ambulatory Visit: Payer: Medicaid Other

## 2016-12-19 DIAGNOSIS — Z3042 Encounter for surveillance of injectable contraceptive: Secondary | ICD-10-CM

## 2017-01-02 ENCOUNTER — Emergency Department (HOSPITAL_COMMUNITY)
Admission: EM | Admit: 2017-01-02 | Discharge: 2017-01-02 | Disposition: A | Discharge status: Home / Self Care | Payer: Self-pay | Attending: Emergency Medicine | Admitting: Emergency Medicine

## 2017-01-02 ENCOUNTER — Emergency Department (HOSPITAL_BASED_OUTPATIENT_CLINIC_OR_DEPARTMENT_OTHER): Admit: 2017-01-02 | Discharge: 2017-01-02 | Disposition: A | Discharge status: Home / Self Care | Payer: Self-pay

## 2017-01-02 ENCOUNTER — Encounter (HOSPITAL_COMMUNITY): Payer: Self-pay

## 2017-01-02 DIAGNOSIS — Z79899 Other long term (current) drug therapy: Secondary | ICD-10-CM | POA: Insufficient documentation

## 2017-01-02 DIAGNOSIS — Z7984 Long term (current) use of oral hypoglycemic drugs: Secondary | ICD-10-CM | POA: Insufficient documentation

## 2017-01-02 DIAGNOSIS — I10 Essential (primary) hypertension: Secondary | ICD-10-CM | POA: Insufficient documentation

## 2017-01-02 DIAGNOSIS — M7989 Other specified soft tissue disorders: Secondary | ICD-10-CM

## 2017-01-02 LAB — CBC WITH DIFFERENTIAL/PLATELET
Basophils Absolute: 0 10*3/uL (ref 0.0–0.1)
Basophils Relative: 0 %
Eosinophils Absolute: 0.1 10*3/uL (ref 0.0–0.7)
Eosinophils Relative: 3 %
HCT: 34.7 % — ABNORMAL LOW (ref 36.0–46.0)
Hemoglobin: 11.8 g/dL — ABNORMAL LOW (ref 12.0–15.0)
Lymphocytes Relative: 48 %
Lymphs Abs: 1.5 10*3/uL (ref 0.7–4.0)
MCH: 25.6 pg — ABNORMAL LOW (ref 26.0–34.0)
MCHC: 34 g/dL (ref 30.0–36.0)
MCV: 75.3 fL — ABNORMAL LOW (ref 78.0–100.0)
Monocytes Absolute: 0.4 10*3/uL (ref 0.1–1.0)
Monocytes Relative: 12 %
Neutro Abs: 1.2 10*3/uL — ABNORMAL LOW (ref 1.7–7.7)
Neutrophils Relative %: 37 %
Platelets: 275 10*3/uL (ref 150–400)
RBC: 4.61 MIL/uL (ref 3.87–5.11)
RDW: 14.2 % (ref 11.5–15.5)
WBC: 3.1 10*3/uL — ABNORMAL LOW (ref 4.0–10.5)

## 2017-01-02 LAB — COMPREHENSIVE METABOLIC PANEL
ALT: 8 U/L — ABNORMAL LOW (ref 14–54)
AST: 13 U/L — ABNORMAL LOW (ref 15–41)
Albumin: 3.8 g/dL (ref 3.5–5.0)
Alkaline Phosphatase: 43 U/L (ref 38–126)
Anion gap: 5 (ref 5–15)
BUN: 12 mg/dL (ref 6–20)
CO2: 22 mmol/L (ref 22–32)
Calcium: 9.3 mg/dL (ref 8.9–10.3)
Chloride: 109 mmol/L (ref 101–111)
Creatinine, Ser: 0.68 mg/dL (ref 0.44–1.00)
GFR calc Af Amer: >60 mL/min (ref >60)
GFR calc non Af Amer: >60 mL/min (ref >60)
Glucose, Bld: 101 mg/dL — ABNORMAL HIGH (ref 65–99)
Potassium: 4.8 mmol/L (ref 3.5–5.1)
Sodium: 136 mmol/L (ref 135–145)
Total Bilirubin: 0.5 mg/dL (ref 0.3–1.2)
Total Protein: 7.4 g/dL (ref 6.5–8.1)

## 2017-01-02 LAB — URINALYSIS, ROUTINE W REFLEX MICROSCOPIC
Bilirubin Urine: NEGATIVE
Glucose, UA: NEGATIVE mg/dL
Hgb urine dipstick: NEGATIVE
Ketones, ur: NEGATIVE mg/dL
Leukocytes, UA: NEGATIVE
Nitrite: NEGATIVE
Protein, ur: NEGATIVE mg/dL
Specific Gravity, Urine: 1.017 (ref 1.005–1.030)
pH: 5 (ref 5.0–8.0)

## 2017-01-02 NOTE — Discharge Instructions (Signed)
See Dr. Alyson Ingles for recheck in 2-3 days

## 2017-01-02 NOTE — ED Notes (Signed)
Walked patient to bathroom patient did great patient is back in bed resting

## 2017-01-02 NOTE — ED Notes (Signed)
Pt transported to vascular on stretcher with tech.

## 2017-01-02 NOTE — ED Triage Notes (Signed)
Per Pt, Pt has reports of left lower arm swelling and pain that started a coup[le days ago. Pt reports some intermittent numbness to the lower arm. Hx of Lupus.

## 2017-01-02 NOTE — Progress Notes (Signed)
**  Preliminary report by tech**  Left upper extremity venous duplex complete. There is no evidence of deep or superficial vein thrombosis involving the left upper extremity. All visualized vessels appear patent and compressible.  Results were given to the patient's nurse, Hassan Rowan.  01/02/17 3:29 PM Carlos Levering RVT

## 2017-01-02 NOTE — ED Provider Notes (Signed)
[facility] DEPT Provider Note   CSN: [id number] Arrival date & time: 01/02/17  6629     History   Chief Complaint Chief Complaint  Patient presents with  . Arm Swelling    HPI [name] is a 41 y.o. female.  The history is provided by the patient. No language interpreter was used.  Arm Injury   This is a new problem. The current episode started more than 2 days ago. The problem has not changed since onset.The pain is present in the left arm. The quality of the pain is described as aching. The pain is moderate. She has tried nothing for the symptoms. The treatment provided no relief. There has been no history of extremity trauma. Family history is significant for no gout.  Pt reports she has swelling to her left arm. Pt reports she noticed 2 days ago.  Pt reports no injury.  Pt is on depo injection for birth control.  Past Medical History:  Diagnosis Date  . Abnormal Pap smear of cervix    colpo, HPV  . Allergy   . Anemia   . Depression    after losses  . Enlarged heart    managed by cardiology  . Fibroid   . Gestational diabetes   . Heart murmur   . Hypertension   . Infection    UTI  . Lupus 1999    Patient Active Problem List   Diagnosis Date Noted  . Fibroids 01/20/2015  . Menorrhagia 01/20/2015  . Low grade squamous intraepithelial lesion (LGSIL) on cervical Pap smear on 01/20/15 01/20/2015  . Atypical glandular cells on cervical Pap smear on 01/20/15 01/20/2015  . Hypertensive urgency 07/15/2013  . Lupus 07/15/2013  . Hypertensive emergency 07/15/2013    Past Surgical History:  Procedure Laterality Date  . THERAPEUTIC ABORTION      OB History    Gravida Para Term Preterm AB Living   5 2 0 2 3 2    SAB TAB Ectopic Multiple Live Births   2 1 0 0 2       Home Medications    Prior to Admission medications   Medication Sig Start Date End Date Taking? Authorizing Provider  albuterol (PROVENTIL HFA;VENTOLIN HFA) 108 (90 BASE) MCG/ACT inhaler  Inhale 2 puffs into the lungs every 6 (six) hours as needed for wheezing or shortness of breath.    [provider]  amLODipine (NORVASC) 10 MG tablet Take 1 tablet (10 mg total) by mouth daily. 03/31/12   Clayton Bibles, PA-C  cloNIDine (CATAPRES) 0.3 MG tablet Take 0.3 mg by mouth 2 (two) times daily.    [provider]  hydroxychloroquine (PLAQUENIL) 200 MG tablet Take 200 mg by mouth 2 (two) times daily.    [provider]  ibuprofen (ADVIL,MOTRIN) 600 MG tablet TAKE 1 TABLET (600 MG TOTAL) BY MOUTH EVERY 6 (SIX) HOURS AS NEEDED. 05/29/15   Anyanwu, Sallyanne Havers, MD  medroxyPROGESTERone (DEPO-PROVERA) 150 MG/ML injection Inject 150 mg into the muscle every 3 (three) months.    [provider]  metFORMIN (GLUCOPHAGE) 500 MG tablet Take 1 tablet (500 mg total) by mouth 2 (two) times daily with a meal. 12/25/13   Phineas Douglas, Carlos American, PA-C  metroNIDAZOLE (FLAGYL) 500 MG tablet Take 1 tablet (500 mg total) by mouth 2 (two) times daily. 10/02/16   Aletha Halim, MD    Family History Family History  Problem Relation Age of Onset  . Cancer Maternal Grandfather  lung  . Diabetes Mother   . Diabetes Maternal Uncle   . Hypertension Maternal Grandmother   . Diabetes Father   . Hearing loss Neg Hx     Social History Social History  Substance Use Topics  . Smoking status: Never Smoker  . Smokeless tobacco: Never Used  . Alcohol use Yes     Comment: rarely     Allergies   Septra [bactrim]   Review of Systems Review of Systems  All other systems reviewed and are negative.    Physical Exam Updated Vital Signs BP 135/78   Pulse 73   Temp 98.6 F (37 C) (Oral)   Resp 18   Ht 5' (1.524 m)   Wt 97.5 kg (215 lb)   SpO2 100%   BMI 41.99 kg/m   Physical Exam  Constitutional: She appears well-developed and well-nourished. No distress.  HENT:  Head: Normocephalic and atraumatic.  Right Ear: External ear normal.  Left Ear: External ear normal.    Eyes: Conjunctivae are normal.  Neck: Neck supple.  Cardiovascular: Normal rate and regular rhythm.   No murmur heard. Pulmonary/Chest: Effort normal and breath sounds normal. No respiratory distress.  Abdominal: Soft. There is no tenderness.  Musculoskeletal: Normal range of motion. She exhibits edema. She exhibits no tenderness.  Swollen left arm, nontender,  Good pulse.  nv and ns intact  Neurological: She is alert.  Skin: Skin is warm and dry.  Psychiatric: She has a normal mood and affect.  Nursing note and vitals reviewed.    ED Treatments / Results  Labs (all labs ordered are listed, but only abnormal results are displayed) Labs Reviewed  CBC WITH DIFFERENTIAL/PLATELET - Abnormal; Notable for the following:       Result Value   WBC 3.1 (*)    Hemoglobin 11.8 (*)    HCT 34.7 (*)    MCV 75.3 (*)    MCH 25.6 (*)    Neutro Abs 1.2 (*)    All other components within normal limits  COMPREHENSIVE METABOLIC PANEL - Abnormal; Notable for the following:    Glucose, Bld 101 (*)    AST 13 (*)    ALT 8 (*)    All other components within normal limits  URINALYSIS, ROUTINE W REFLEX MICROSCOPIC    EKG  EKG Interpretation None       Radiology No results found.  Procedures Procedures (including critical care time)  Medications Ordered in ED Medications - No data to display   Initial Impression / Assessment and Plan / ED Course  I have reviewed the triage vital signs and the nursing notes.  Pertinent labs & imaging results that were available during my care of the patient were reviewed by me and considered in my medical decision making (see chart for details).     Ultrasound shows no evidence of dvt.   Pt has slight swelling.  I advised pt to recheck with her primray in 2 days.   Final Clinical Impressions(s) / ED Diagnoses   Final diagnoses:  Left arm swelling    New Prescriptions Discharge Medication List as of 01/02/2017  4:58 PM    An After Visit  Summary was printed and given to the patient.    Fransico Meadow, PA-C 01/02/17 1745    Milton Ferguson, MD 01/04/17 [phone]

## 2017-02-24 ENCOUNTER — Ambulatory Visit (INDEPENDENT_AMBULATORY_CARE_PROVIDER_SITE_OTHER): Payer: Self-pay

## 2017-02-24 ENCOUNTER — Encounter (HOSPITAL_COMMUNITY): Payer: Self-pay | Admitting: *Deleted

## 2017-02-24 ENCOUNTER — Ambulatory Visit (HOSPITAL_COMMUNITY)
Admission: EM | Admit: 2017-02-24 | Discharge: 2017-02-24 | Disposition: A | Discharge status: Home / Self Care | Payer: Self-pay | Attending: Internal Medicine | Admitting: Internal Medicine

## 2017-02-24 DIAGNOSIS — W010XXA Fall on same level from slipping, tripping and stumbling without subsequent striking against object, initial encounter: Secondary | ICD-10-CM

## 2017-02-24 DIAGNOSIS — M25561 Pain in right knee: Secondary | ICD-10-CM

## 2017-02-24 DIAGNOSIS — S8002XA Contusion of left knee, initial encounter: Secondary | ICD-10-CM

## 2017-02-24 DIAGNOSIS — W19XXXA Unspecified fall, initial encounter: Secondary | ICD-10-CM

## 2017-02-24 DIAGNOSIS — M25562 Pain in left knee: Secondary | ICD-10-CM

## 2017-02-24 MED ORDER — NAPROXEN 500 MG PO TABS: ORAL_TABLET | ORAL | 0 refills | Status: DC

## 2017-02-24 NOTE — ED Triage Notes (Signed)
Reports slipping on wet floor at grocery store today, landing primarily on bilat knees.  C/O bilat knee pain - L>R with pain extending up left upper leg and into left lower and middle back.  C/O painful weight-bearing.

## 2017-02-24 NOTE — Discharge Instructions (Signed)
You have knee contusion most likely. No fractures seen in the left knee. Treat with ice 20 minutes throughout the day. Rest. Wear immobilizer for comfort when walking. May take off at rest. May use medication as needed for pain and swelling. F/U with Ortho if symptoms persist.

## 2017-02-24 NOTE — ED Provider Notes (Signed)
CSN: [id number]     Arrival date & time 02/24/17  1432 History   First MD Initiated Contact with Patient 02/24/17 1525     Chief Complaint  Patient presents with  . Fall   (Consider location/radiation/quality/duration/timing/severity/associated sxs/prior Treatment) 41 yo female presents with bilateral knee pain, though L>R after sustaining a fall earlier today. She was at [employer] and slipped on the wet floor. She landing on both bent knees, but states her left knee took the main part of the fall. She has painful weight bearing on the left knee with pain that radiates up into the thigh.       Past Medical History:  Diagnosis Date  . Abnormal Pap smear of cervix    colpo, HPV  . Allergy   . Anemia   . Depression    after losses  . Enlarged heart    managed by cardiology  . Fibroid   . Gestational diabetes   . Heart murmur   . Hypertension   . Infection    UTI  . Lupus 1999   Past Surgical History:  Procedure Laterality Date  . THERAPEUTIC ABORTION     Family History  Problem Relation Age of Onset  . Cancer Maternal Grandfather        lung  . Diabetes Mother   . Diabetes Maternal Uncle   . Hypertension Maternal Grandmother   . Diabetes Father   . Hearing loss Neg Hx    Social History  Substance Use Topics  . Smoking status: Never Smoker  . Smokeless tobacco: Never Used  . Alcohol use Yes     Comment: rarely   OB History    Gravida Para Term Preterm AB Living   5 2 0 2 3 2    SAB TAB Ectopic Multiple Live Births   2 1 0 0 2     Review of Systems  All other systems reviewed and are negative.   Allergies  Septra [bactrim]  Home Medications   Prior to Admission medications   Medication Sig Start Date End Date Taking? Authorizing Provider  amLODipine (NORVASC) 10 MG tablet Take 1 tablet (10 mg total) by mouth daily. 03/31/12  Yes West, Emily, PA-C  cloNIDine (CATAPRES) 0.3 MG tablet Take 0.3 mg by mouth 2 (two) times daily.   Yes [provider]  hydroxychloroquine (PLAQUENIL) 200 MG tablet Take 200 mg by mouth 2 (two) times daily.   Yes [provider]  medroxyPROGESTERone (DEPO-PROVERA) 150 MG/ML injection Inject 150 mg into the muscle every 3 (three) months.   Yes [provider]  metFORMIN (GLUCOPHAGE) 500 MG tablet Take 1 tablet (500 mg total) by mouth 2 (two) times daily with a meal. 12/25/13  Yes Palmer, Janett Billow K, PA-C  albuterol (PROVENTIL HFA;VENTOLIN HFA) 108 (90 BASE) MCG/ACT inhaler Inhale 2 puffs into the lungs every 6 (six) hours as needed for wheezing or shortness of breath.    [provider]  ibuprofen (ADVIL,MOTRIN) 600 MG tablet TAKE 1 TABLET (600 MG TOTAL) BY MOUTH EVERY 6 (SIX) HOURS AS NEEDED. 05/29/15   Anyanwu, Sallyanne Havers, MD  metroNIDAZOLE (FLAGYL) 500 MG tablet Take 1 tablet (500 mg total) by mouth 2 (two) times daily. 10/02/16   Aletha Halim, MD  naproxen (NAPROSYN) 500 MG tablet 1 tablet every 12 hours prn pain 02/24/17   Bjorn Pippin, PA-C   Meds Ordered and Administered this Visit  Medications - No data to display  BP 131/62   Pulse 80  Temp 98.8 F (37.1 C) (Oral)   Resp 16   SpO2 100%  No data found.   Physical Exam  Constitutional: She is oriented to person, place, and time. She appears well-developed and well-nourished. No distress.  Musculoskeletal:  Both knees without ecchymosis or wounds. Left knee with mild swelling just superior to knee, painful to touch and painful to full flexion. Right knee with full ROM. No effusions are noted  Neurological: She is alert and oriented to person, place, and time.  Skin: Skin is warm and dry. She is not diaphoretic.  Psychiatric: Her behavior is normal.  Nursing note and vitals reviewed.   Urgent Care Course     Procedures (including critical care time)  Labs Review Labs Reviewed - No data to display  Imaging Review Dg Knee Complete 4 Views Left  Result Date: 02/24/2017 CLINICAL DATA:  Left knee pain due to  a fall today. Initial encounter. EXAM: LEFT KNEE - COMPLETE 4+ VIEW COMPARISON:  None. FINDINGS: No evidence of fracture, dislocation, or joint effusion. No evidence of arthropathy or other focal bone abnormality. Soft tissues are unremarkable. IMPRESSION: Negative exam. Electronically Signed   By: Inge Rise M.D.   On: 02/24/2017 16:32     Visual Acuity Review  Right Eye Distance:   Left Eye Distance:   Bilateral Distance:    Right Eye Near:   Left Eye Near:    Bilateral Near:         MDM   1. Fall, initial encounter   2. Acute pain of both knees   3. Contusion of left knee, initial encounter    No evidence of fracture. Treat with immobilizer to the left knee, ice, rest and weight bearing as tolerated. May use NSAID's as needed. F/U with Orthopedics if pain is not improving.     Bjorn Pippin, [place] 02/24/17 [phone]

## 2017-02-28 ENCOUNTER — Ambulatory Visit (INDEPENDENT_AMBULATORY_CARE_PROVIDER_SITE_OTHER): Payer: Self-pay | Admitting: Orthopaedic Surgery

## 2017-02-28 DIAGNOSIS — M25562 Pain in left knee: Secondary | ICD-10-CM

## 2017-02-28 NOTE — Progress Notes (Signed)
Office Visit Note   Patient: [name]           Date of Birth: [birth year]           MRN: [id number] Visit Date: 02/28/2017              Requested by: Ricke Hey, MD [place], [place] [id number] PCP: Ricke Hey, MD   Assessment & Plan: Visit Diagnoses:  1. Acute pain of left knee     Plan: Overall impression is left knee prepatellar bursa contusion. Recommend simple treatment with ice, scheduled NSAIDs. Patient may return back to work on Monday. Questions encouraged and answered. Follow-up as  Follow-Up Instructions: Return if symptoms worsen or fail to improve.   Orders:  No orders of the defined types were placed in this encounter.  No orders of the defined types were placed in this encounter.     Procedures: No procedures performed   Clinical Data: No additional findings.   Subjective: No chief complaint on file.   Patient is a 41 year old female who fell directly onto her left knee about 4 days ago at [employer]. She is states that she has stiffness and sharp pains on the anterior aspect of the knee. She also complains of some locking up because of the stiffness. The swelling has significantly improved. She denies any instability or numbness or tingling.    Review of Systems  Constitutional: Negative.   HENT: Negative.   Eyes: Negative.   Respiratory: Negative.   Cardiovascular: Negative.   Endocrine: Negative.   Musculoskeletal: Negative.   Neurological: Negative.   Hematological: Negative.   Psychiatric/Behavioral: Negative.   All other systems reviewed and are negative.    Objective: Vital Signs: There were no vitals taken for this visit.  Physical Exam  Constitutional: She is oriented to person, place, and time. She appears well-developed and well-nourished.  HENT:  Head: Normocephalic and atraumatic.  Eyes: EOM are normal.  Neck: Neck supple.  Pulmonary/Chest: Effort normal.  Abdominal: Soft.    Neurological: She is alert and oriented to person, place, and time.  Skin: Skin is warm. Capillary refill takes less than 2 seconds.  Psychiatric: She has a normal mood and affect. Her behavior is normal. Judgment and thought content normal.  Nursing note and vitals reviewed.   Ortho Exam Left knee exam shows no joint effusion. She is tender over the prepatellar bursa. Normal muscular strength. Sensation is normal nonfocal findings. Collaterals and cruciates are stable. Excellent range of motion. Specialty Comments:  No specialty comments available.  Imaging: No results found.   PMFS History: Patient Active Problem List   Diagnosis Date Noted  . Fibroids 01/20/2015  . Menorrhagia 01/20/2015  . Low grade squamous intraepithelial lesion (LGSIL) on cervical Pap smear on 01/20/15 01/20/2015  . Atypical glandular cells on cervical Pap smear on 01/20/15 01/20/2015  . Hypertensive urgency 07/15/2013  . Lupus 07/15/2013  . Hypertensive emergency 07/15/2013   Past Medical History:  Diagnosis Date  . Abnormal Pap smear of cervix    colpo, HPV  . Allergy   . Anemia   . Depression    after losses  . Enlarged heart    managed by cardiology  . Fibroid   . Gestational diabetes   . Heart murmur   . Hypertension   . Infection    UTI  . Lupus 1999    Family History  Problem Relation Age of Onset  . Cancer Maternal Grandfather  lung  . Diabetes Mother   . Diabetes Maternal Uncle   . Hypertension Maternal Grandmother   . Diabetes Father   . Hearing loss Neg Hx     Past Surgical History:  Procedure Laterality Date  . THERAPEUTIC ABORTION     Social History   Occupational History  . Not on file.   Social History Main Topics  . Smoking status: Never Smoker  . Smokeless tobacco: Never Used  . Alcohol use Yes     Comment: rarely  . Drug use: No  . Sexual activity: Not on file

## 2017-03-06 ENCOUNTER — Ambulatory Visit (INDEPENDENT_AMBULATORY_CARE_PROVIDER_SITE_OTHER): Payer: Medicaid Other | Admitting: General Practice

## 2017-03-06 DIAGNOSIS — Z3049 Encounter for surveillance of other contraceptives: Secondary | ICD-10-CM

## 2017-03-06 DIAGNOSIS — D259 Leiomyoma of uterus, unspecified: Secondary | ICD-10-CM

## 2017-03-06 NOTE — Progress Notes (Signed)
Patient states she left the house today and forgot her blood pressure medication. Discussed she take the medication immediately upon arriving home and reviewed heart attack/stroke symptoms and reasons to go to ER. Patient verbalized understanding

## 2017-03-11 ENCOUNTER — Telehealth (INDEPENDENT_AMBULATORY_CARE_PROVIDER_SITE_OTHER): Payer: Self-pay | Admitting: Orthopaedic Surgery

## 2017-03-11 NOTE — Telephone Encounter (Signed)
RECORDS & BILLING FAXED TO [facility]

## 2017-04-23 ENCOUNTER — Ambulatory Visit (INDEPENDENT_AMBULATORY_CARE_PROVIDER_SITE_OTHER): Payer: Self-pay | Admitting: Orthopaedic Surgery

## 2017-04-23 ENCOUNTER — Encounter (INDEPENDENT_AMBULATORY_CARE_PROVIDER_SITE_OTHER): Payer: Self-pay | Admitting: Orthopaedic Surgery

## 2017-04-23 DIAGNOSIS — G8929 Other chronic pain: Secondary | ICD-10-CM

## 2017-04-23 DIAGNOSIS — M25562 Pain in left knee: Secondary | ICD-10-CM

## 2017-04-23 DIAGNOSIS — M25561 Pain in right knee: Secondary | ICD-10-CM

## 2017-04-23 NOTE — Progress Notes (Signed)
Office Visit Note   Patient: [name]           Date of Birth: Mar 29, 1976           MRN: [id number] Visit Date: 04/23/2017              Requested by: Ricke Hey, MD [place], [place] [id number] PCP: Ricke Hey, MD   Assessment & Plan: Visit Diagnoses:  1. Chronic pain of both knees     Plan: Overall impression is bilateral patellofemoral syndrome with exacerbation from the fall. I recommend physical therapy for quadriceps strengthening. I cannot identify any worrisome features today.  Follow-Up Instructions: Return if symptoms worsen or fail to improve.   Orders:  No orders of the defined types were placed in this encounter.  No orders of the defined types were placed in this encounter.     Procedures: No procedures performed   Clinical Data: No additional findings.   Subjective: Chief Complaint  Patient presents with  . Right Knee - Pain  . Left Knee - Pain    Patient is a 41 year old female who fell directly on her knees in [place] and [employer]. She says she occasionally will have some pain in the back of her knee. Her left knee is worse in the pain is mainly anterior. She feels some sharp pains. Denies any radiation of pain or numbness.    Review of Systems  Constitutional: Negative.   HENT: Negative.   Eyes: Negative.   Respiratory: Negative.   Cardiovascular: Negative.   Endocrine: Negative.   Musculoskeletal: Negative.   Neurological: Negative.   Hematological: Negative.   Psychiatric/Behavioral: Negative.   All other systems reviewed and are negative.    Objective: Vital Signs: There were no vitals taken for this visit.  Physical Exam  Constitutional: She is oriented to person, place, and time. She appears well-developed and well-nourished.  HENT:  Head: Normocephalic and atraumatic.  Eyes: EOM are normal.  Neck: Neck supple.  Pulmonary/Chest: Effort normal.  Abdominal: Soft.  Neurological: She is  alert and oriented to person, place, and time.  Skin: Skin is warm. Capillary refill takes less than 2 seconds.  Psychiatric: She has a normal mood and affect. Her behavior is normal. Judgment and thought content normal.  Nursing note and vitals reviewed.   Ortho Exam  Bilateral knee exam shows normal patellar tracking. No patellar crepitus. No joint effusion. Collaterals and cruciates are stable.  No joint line tenderness. Specialty Comments:  No specialty comments available.  Imaging: No results found.   PMFS History: Patient Active Problem List   Diagnosis Date Noted  . Chronic pain of both knees 04/23/2017  . Fibroids 01/20/2015  . Menorrhagia 01/20/2015  . Low grade squamous intraepithelial lesion (LGSIL) on cervical Pap smear on 01/20/15 01/20/2015  . Atypical glandular cells on cervical Pap smear on 01/20/15 01/20/2015  . Hypertensive urgency 07/15/2013  . Lupus 07/15/2013  . Hypertensive emergency 07/15/2013   Past Medical History:  Diagnosis Date  . Abnormal Pap smear of cervix    colpo, HPV  . Allergy   . Anemia   . Depression    after losses  . Enlarged heart    managed by cardiology  . Fibroid   . Gestational diabetes   . Heart murmur   . Hypertension   . Infection    UTI  . Lupus 1999    Family History  Problem Relation Age of Onset  . Cancer  Maternal Grandfather        lung  . Diabetes Mother   . Diabetes Maternal Uncle   . Hypertension Maternal Grandmother   . Diabetes Father   . Hearing loss Neg Hx     Past Surgical History:  Procedure Laterality Date  . THERAPEUTIC ABORTION     Social History   Occupational History  . Not on file.   Social History Main Topics  . Smoking status: Never Smoker  . Smokeless tobacco: Never Used  . Alcohol use Yes     Comment: rarely  . Drug use: No  . Sexual activity: Not on file

## 2017-05-22 ENCOUNTER — Ambulatory Visit (INDEPENDENT_AMBULATORY_CARE_PROVIDER_SITE_OTHER): Payer: Medicaid Other | Admitting: General Practice

## 2017-05-22 VITALS — BP 145/84 | HR 115 | Ht 60.0 in | Wt 221.0 lb

## 2017-05-22 DIAGNOSIS — N938 Other specified abnormal uterine and vaginal bleeding: Secondary | ICD-10-CM

## 2017-05-22 DIAGNOSIS — Z3049 Encounter for surveillance of other contraceptives: Secondary | ICD-10-CM | POA: Diagnosis not present

## 2017-05-22 MED ORDER — MEDROXYPROGESTERONE ACETATE 150 MG/ML IM SUSP
150.0000 mg | Freq: Once | INTRAMUSCULAR | Status: AC
  Administered 2017-05-22: 150 mg via INTRAMUSCULAR

## 2017-05-22 NOTE — Progress Notes (Signed)
Agree w/ POC for Depo. BP mildly elevated, but Depo appropriate. Next dose due in 12 weeks.   Tamala Julian, [place], [place] 05/22/2017 12:05 PM

## 2017-06-05 ENCOUNTER — Ambulatory Visit (INDEPENDENT_AMBULATORY_CARE_PROVIDER_SITE_OTHER): Payer: Medicaid Other | Admitting: Obstetrics & Gynecology

## 2017-06-05 ENCOUNTER — Encounter: Payer: Self-pay | Admitting: Obstetrics & Gynecology

## 2017-06-05 ENCOUNTER — Other Ambulatory Visit (HOSPITAL_COMMUNITY)
Admission: RE | Admit: 2017-06-05 | Discharge: 2017-06-05 | Disposition: A | Discharge status: Home / Self Care | Payer: Medicaid Other | Source: Ambulatory Visit | Attending: Obstetrics & Gynecology | Admitting: Obstetrics & Gynecology

## 2017-06-05 VITALS — BP 122/79 | HR 80 | Ht 60.0 in | Wt 227.3 lb

## 2017-06-05 DIAGNOSIS — N898 Other specified noninflammatory disorders of vagina: Secondary | ICD-10-CM

## 2017-06-05 DIAGNOSIS — Z113 Encounter for screening for infections with a predominantly sexual mode of transmission: Secondary | ICD-10-CM

## 2017-06-05 DIAGNOSIS — R87619 Unspecified abnormal cytological findings in specimens from cervix uteri: Secondary | ICD-10-CM

## 2017-06-05 DIAGNOSIS — R87612 Low grade squamous intraepithelial lesion on cytologic smear of cervix (LGSIL): Secondary | ICD-10-CM

## 2017-06-05 NOTE — Progress Notes (Signed)
Patient ID: [name], female   DOB: 1975-09-15, 41 y.o.   MRN: [id number]  Chief Complaint  Patient presents with  . Vaginal Discharge    HPI [name] is a 41 y.o. female.  She reports a few days of mucus discharge and vulvar irritation with a tender vulvar lesion. It had been years since she had similar symptoms. HPI  Past Medical History:  Diagnosis Date  . Abnormal Pap smear of cervix    colpo, HPV  . Allergy   . Anemia   . Depression    after losses  . Enlarged heart    managed by cardiology  . Fibroid   . Gestational diabetes   . Heart murmur   . Hypertension   . Infection    UTI  . Lupus 1999    Past Surgical History:  Procedure Laterality Date  . THERAPEUTIC ABORTION      Family History  Problem Relation Age of Onset  . Cancer Maternal Grandfather        lung  . Diabetes Mother   . Diabetes Maternal Uncle   . Hypertension Maternal Grandmother   . Diabetes Father   . Hearing loss Neg Hx     Social History Social History  Substance Use Topics  . Smoking status: Never Smoker  . Smokeless tobacco: Never Used  . Alcohol use Yes     Comment: rarely    Allergies  Allergen Reactions  . Septra [Bactrim] Hives    Current Outpatient Prescriptions  Medication Sig Dispense Refill  . amLODipine (NORVASC) 10 MG tablet Take 1 tablet (10 mg total) by mouth daily. 30 tablet 0  . cloNIDine (CATAPRES) 0.3 MG tablet Take 0.3 mg by mouth 2 (two) times daily.    . hydroxychloroquine (PLAQUENIL) 200 MG tablet Take 200 mg by mouth 2 (two) times daily.    Marland Kitchen ibuprofen (ADVIL,MOTRIN) 600 MG tablet TAKE 1 TABLET (600 MG TOTAL) BY MOUTH EVERY 6 (SIX) HOURS AS NEEDED. 60 tablet 2  . medroxyPROGESTERone (DEPO-PROVERA) 150 MG/ML injection Inject 150 mg into the muscle every 3 (three) months.    . metFORMIN (GLUCOPHAGE) 500 MG tablet Take 1 tablet (500 mg total) by mouth 2 (two) times daily with a meal. 60 tablet 1  . naproxen (NAPROSYN) 500 MG tablet 1 tablet  every 12 hours prn pain 30 tablet 0  . albuterol (PROVENTIL HFA;VENTOLIN HFA) 108 (90 BASE) MCG/ACT inhaler Inhale 2 puffs into the lungs every 6 (six) hours as needed for wheezing or shortness of breath.    . metroNIDAZOLE (FLAGYL) 500 MG tablet Take 1 tablet (500 mg total) by mouth 2 (two) times daily. (Patient not taking: Reported on 06/05/2017) 14 tablet 0   Current Facility-Administered Medications  Medication Dose Route Frequency Provider Last Rate Last Dose  . medroxyPROGESTERone (DEPO-PROVERA) injection 150 mg  150 mg Intramuscular Q90 days Anyanwu, Sallyanne Havers, MD   150 mg at 03/06/17 1539    Review of Systems Review of Systems  Constitutional: Negative.   Respiratory: Negative.   Gastrointestinal: Negative.   Genitourinary: Positive for vaginal discharge and vaginal pain (vulvar). Negative for menstrual problem and pelvic pain.    Blood pressure 122/79, pulse 80, height 5' (1.524 m), weight 227 lb 4.8 oz (103.1 kg), last menstrual period 03/26/2017.  Physical Exam Physical Exam  Constitutional: She appears well-developed.  Pulmonary/Chest: She is in respiratory distress.  Genitourinary: Vaginal discharge (white) found.  Genitourinary Comments: Vulvar herpetiform lesion anterior left, no pelvic mass  or tenderness  Psychiatric: She has a normal mood and affect. Her behavior is normal.  Vitals reviewed.   Data Reviewed Pap results 09/2016  Assessment    Suspect genital herpes outbreak. PCR test pending     Plan    F/U on STD test results including HSV test from vulvar lesion Outpatient Encounter Prescriptions as of 06/05/2017  Medication Sig  . amLODipine (NORVASC) 10 MG tablet Take 1 tablet (10 mg total) by mouth daily.  . cloNIDine (CATAPRES) 0.3 MG tablet Take 0.3 mg by mouth 2 (two) times daily.  . hydroxychloroquine (PLAQUENIL) 200 MG tablet Take 200 mg by mouth 2 (two) times daily.  Marland Kitchen ibuprofen (ADVIL,MOTRIN) 600 MG tablet TAKE 1 TABLET (600 MG TOTAL) BY MOUTH  EVERY 6 (SIX) HOURS AS NEEDED.  . medroxyPROGESTERone (DEPO-PROVERA) 150 MG/ML injection Inject 150 mg into the muscle every 3 (three) months.  . metFORMIN (GLUCOPHAGE) 500 MG tablet Take 1 tablet (500 mg total) by mouth 2 (two) times daily with a meal.  . naproxen (NAPROSYN) 500 MG tablet 1 tablet every 12 hours prn pain  . albuterol (PROVENTIL HFA;VENTOLIN HFA) 108 (90 BASE) MCG/ACT inhaler Inhale 2 puffs into the lungs every 6 (six) hours as needed for wheezing or shortness of breath.  . metroNIDAZOLE (FLAGYL) 500 MG tablet Take 1 tablet (500 mg total) by mouth 2 (two) times daily. (Patient not taking: Reported on 06/05/2017)   Facility-Administered Encounter Medications as of 06/05/2017  Medication  . medroxyPROGESTERone (DEPO-PROVERA) injection 150 mg      Flagyl 2 g  And Valtrex for suspected HSV     Emeterio Reeve 06/05/2017, 10:56 AM

## 2017-06-05 NOTE — Progress Notes (Signed)
Cc:Pt stated having clear discharge, irritation for about 5 days. Tried OTC monostat but did not help.

## 2017-06-05 NOTE — Patient Instructions (Signed)
Sexually Transmitted Disease  A sexually transmitted disease (STD) is a disease or infection that may be passed (transmitted) from person to person, usually during sexual activity. This may happen by way of saliva, semen, blood, vaginal mucus, or urine. Common STDs include:   Gonorrhea.   Chlamydia.   Syphilis.   HIV and AIDS.   Genital herpes.   Hepatitis B and C.   Trichomonas.   Human papillomavirus (HPV).   Pubic lice.   Scabies.   Mites.   Bacterial vaginosis.    What are the causes?  An STD may be caused by bacteria, a virus, or parasites. STDs are often transmitted during sexual activity if one person is infected. However, they may also be transmitted through nonsexual means. STDs may be transmitted after:   Sexual intercourse with an infected person.   Sharing sex toys with an infected person.   Sharing needles with an infected person or using unclean piercing or tattoo needles.   Having intimate contact with the genitals, mouth, or rectal areas of an infected person.   Exposure to infected fluids during birth.    What are the signs or symptoms?  Different STDs have different symptoms. Some people may not have any symptoms. If symptoms are present, they may include:   Painful or bloody urination.   Pain in the pelvis, abdomen, vagina, anus, throat, or eyes.   A skin rash, itching, or irritation.   Growths, ulcerations, blisters, or sores in the genital and anal areas.   Abnormal vaginal discharge with or without bad odor.   Penile discharge in men.   Fever.   Pain or bleeding during sexual intercourse.   Swollen glands in the groin area.   Yellow skin and eyes (jaundice). This is seen with hepatitis.   Swollen testicles.   Infertility.   Sores and blisters in the mouth.    How is this diagnosed?  To make a diagnosis, your health care provider may:   Take a medical history.   Perform a physical exam.   Take a sample of any discharge to examine.   Swab the throat, cervix,  opening to the penis, rectum, or vagina for testing.   Test a sample of your first morning urine.   Perform blood tests.   Perform a Pap test, if this applies.   Perform a colposcopy.   Perform a laparoscopy.    How is this treated?  Treatment depends on the STD. Some STDs may be treated but not cured.   Chlamydia, gonorrhea, trichomonas, and syphilis can be cured with antibiotic medicine.   Genital herpes, hepatitis, and HIV can be treated, but not cured, with prescribed medicines. The medicines lessen symptoms.   Genital warts from HPV can be treated with medicine or by freezing, burning (electrocautery), or surgery. Warts may come back.   HPV cannot be cured with medicine or surgery. However, abnormal areas may be removed from the cervix, vagina, or vulva.   If your diagnosis is confirmed, your recent sexual partners need treatment. This is true even if they are symptom-free or have a negative culture or evaluation. They should not have sex until their health care providers say it is okay.   Your health care provider may test you for infection again 3 months after treatment.    How is this prevented?  Take these steps to reduce your risk of getting an STD:   Use latex condoms, dental dams, and water-soluble lubricants during sexual activity. Do not use   petroleum jelly or oils.   Avoid having multiple sex partners.   Do not have sex with someone who has other sex partners.   Do not have sex with anyone you do not know or who is at high risk for an STD.   Avoid risky sex practices that can break your skin.   Do not have sex if you have open sores on your mouth or skin.   Avoid drinking too much alcohol or taking illegal drugs. Alcohol and drugs can affect your judgment and put you in a vulnerable position.   Avoid engaging in oral and anal sex acts.   Get vaccinated for HPV and hepatitis. If you have not received these vaccines in the past, talk to your health care provider about whether one or  both might be right for you.   If you are at risk of being infected with HIV, it is recommended that you take a prescription medicine daily to prevent HIV infection. This is called pre-exposure prophylaxis (PrEP). You are considered at risk if:  ? You are a man who has sex with other men (MSM).  ? You are a heterosexual man or woman and are sexually active with more than one partner.  ? You take drugs by injection.  ? You are sexually active with a partner who has HIV.   Talk with your health care provider about whether you are at high risk of being infected with HIV. If you choose to begin PrEP, you should first be tested for HIV. You should then be tested every 3 months for as long as you are taking PrEP.    Contact a health care provider if:   See your health care provider.   Tell your sexual partner(s). They should be tested and treated for any STDs.   Do not have sex until your health care provider says it is okay.  Get help right away if:  Contact your health care provider right away if:   You have severe abdominal pain.   You are a man and notice swelling or pain in your testicles.   You are a woman and notice swelling or pain in your vagina.    This information is not intended to replace advice given to you by your health care provider. Make sure you discuss any questions you have with your health care provider.  Document Released: 10/20/2002 Document Revised: 02/17/2016 Document Reviewed: 02/17/2013  Elsevier Interactive Patient Education  2018 [employer]

## 2017-06-06 ENCOUNTER — Encounter: Payer: Self-pay | Admitting: Obstetrics & Gynecology

## 2017-06-06 LAB — CERVICOVAGINAL ANCILLARY ONLY
Bacterial vaginitis: NEGATIVE
Candida vaginitis: NEGATIVE
Chlamydia: NEGATIVE
Neisseria Gonorrhea: NEGATIVE
Trichomonas: POSITIVE — AB

## 2017-06-06 LAB — HIV ANTIBODY (ROUTINE TESTING W REFLEX): HIV Screen 4th Generation wRfx: NONREACTIVE

## 2017-06-06 LAB — HEPATITIS C ANTIBODY: Hep C Virus Ab: <0.1 s/co ratio (ref 0.0–0.9)

## 2017-06-06 LAB — RPR: RPR Ser Ql: NONREACTIVE

## 2017-06-06 LAB — HEPATITIS B SURFACE ANTIGEN: Hepatitis B Surface Ag: NEGATIVE

## 2017-06-06 MED ORDER — VALACYCLOVIR HCL 1 G PO TABS: 1000.0000 mg | ORAL_TABLET | Freq: Every day | ORAL | 2 refills | Status: DC

## 2017-06-06 MED ORDER — METRONIDAZOLE 500 MG PO TABS: ORAL_TABLET | ORAL | 0 refills | Status: DC

## 2017-06-07 LAB — HERPES SIMPLEX VIRUS(HSV) DNA BY PCR
HSV 2 DNA: NEGATIVE
HSV-1 DNA: NEGATIVE

## 2017-06-10 ENCOUNTER — Encounter: Payer: Self-pay | Admitting: Obstetrics & Gynecology

## 2017-06-18 ENCOUNTER — Ambulatory Visit: Payer: Medicaid Other | Admitting: Medical

## 2017-07-19 ENCOUNTER — Encounter (HOSPITAL_COMMUNITY): Payer: Self-pay | Admitting: Emergency Medicine

## 2017-07-19 ENCOUNTER — Ambulatory Visit (HOSPITAL_COMMUNITY)
Admission: EM | Admit: 2017-07-19 | Discharge: 2017-07-19 | Disposition: A | Discharge status: Home / Self Care | Payer: Medicaid Other | Attending: Family Medicine | Admitting: Family Medicine

## 2017-07-19 DIAGNOSIS — R05 Cough: Secondary | ICD-10-CM | POA: Insufficient documentation

## 2017-07-19 DIAGNOSIS — N390 Urinary tract infection, site not specified: Secondary | ICD-10-CM

## 2017-07-19 DIAGNOSIS — B9789 Other viral agents as the cause of diseases classified elsewhere: Secondary | ICD-10-CM

## 2017-07-19 DIAGNOSIS — I10 Essential (primary) hypertension: Secondary | ICD-10-CM | POA: Insufficient documentation

## 2017-07-19 DIAGNOSIS — F329 Major depressive disorder, single episode, unspecified: Secondary | ICD-10-CM | POA: Insufficient documentation

## 2017-07-19 DIAGNOSIS — J069 Acute upper respiratory infection, unspecified: Secondary | ICD-10-CM | POA: Insufficient documentation

## 2017-07-19 LAB — POCT URINALYSIS DIP (DEVICE)
Bilirubin Urine: NEGATIVE
Glucose, UA: 100 mg/dL — AB
Ketones, ur: NEGATIVE mg/dL
Nitrite: NEGATIVE
Protein, ur: 30 mg/dL — AB
Specific Gravity, Urine: 1.02 (ref 1.005–1.030)
Urobilinogen, UA: 1 mg/dL (ref 0.0–1.0)
pH: 7 (ref 5.0–8.0)

## 2017-07-19 MED ORDER — CEPHALEXIN 500 MG PO CAPS: 500.0000 mg | ORAL_CAPSULE | Freq: Four times a day (QID) | ORAL | 0 refills | Status: DC

## 2017-07-19 NOTE — ED Triage Notes (Signed)
Pt here with URI sx and UTI sx x 3 days

## 2017-07-19 NOTE — Discharge Instructions (Addendum)
Your blood pressure is elevated today.  Make sure you are taking your medicine and let your primary care doctor know that the blood pressure was elevated today.

## 2017-07-19 NOTE — ED Provider Notes (Signed)
[place]   [id number] 07/19/17 Arrival Time: 1259   SUBJECTIVE:  [name] is a 41 y.o. female who presents to the urgent care with complaint of URI sx for a week (cough, congestion) and UTI sx(frequency) x 3 days. Works as a Scientist, water quality at [employer].  Past Medical History:  Diagnosis Date  . Abnormal Pap smear of cervix    colpo, HPV  . Allergy   . Anemia   . Depression    after losses  . Enlarged heart    managed by cardiology  . Fibroid   . Gestational diabetes   . Heart murmur   . Hypertension   . Infection    UTI  . Lupus 1999   Family History  Problem Relation Age of Onset  . Cancer Maternal Grandfather        lung  . Diabetes Mother   . Diabetes Maternal Uncle   . Hypertension Maternal Grandmother   . Diabetes Father   . Hearing loss Neg Hx    Social History   Socioeconomic History  . Marital status: Legally Separated    Spouse name: Not on file  . Number of children: Not on file  . Years of education: Not on file  . Highest education level: Not on file  Social Needs  . Financial resource strain: Not on file  . Food insecurity - worry: Not on file  . Food insecurity - inability: Not on file  . Transportation needs - medical: Not on file  . Transportation needs - non-medical: Not on file  Occupational History  . Not on file  Tobacco Use  . Smoking status: Never Smoker  . Smokeless tobacco: Never Used  Substance and Sexual Activity  . Alcohol use: Yes    Comment: rarely  . Drug use: No  . Sexual activity: Yes    Birth control/protection: Injection  Other Topics Concern  . Not on file  Social History Narrative  . Not on file   Current Facility-Administered Medications for the 07/19/17 encounter [facility] Encounter)  Medication  . medroxyPROGESTERone (DEPO-PROVERA) injection 150 mg   No outpatient medications have been marked as taking for the 07/19/17 encounter [facility] Encounter).   Allergies  Allergen  Reactions  . Septra [Bactrim] Hives      ROS: As per HPI, remainder of ROS negative.   OBJECTIVE:   Vitals:   07/19/17 1311  BP: (!) 192/105  Pulse: (!) 118  Resp: 18  Temp: 99 F (37.2 C)  TempSrc: Oral  SpO2: 97%     General appearance: alert; no distress Eyes: PERRL; EOMI; conjunctiva normal HENT: normocephalic; atraumatic; TMs normal, canal normal, external ears normal without trauma; nasal mucosa normal; oral mucosa normal Neck: supple Lungs: clear to auscultation bilaterally Heart: rapid rate and rhythm Back: no CVA tenderness Extremities: no cyanosis or edema; symmetrical with no gross deformities Skin: warm and dry Neurologic: normal gait; grossly normal Psychological: alert and cooperative; normal mood and affect      Labs:  Results for orders placed or performed during the hospital encounter of 07/19/17  POCT urinalysis dip (device)  Result Value Ref Range   Glucose, UA 100 (A) NEGATIVE mg/dL   Bilirubin Urine NEGATIVE NEGATIVE   Ketones, ur NEGATIVE NEGATIVE mg/dL   Specific Gravity, Urine 1.020 1.005 - 1.030   Hgb urine dipstick MODERATE (A) NEGATIVE   pH 7.0 5.0 - 8.0   Protein, ur 30 (A) NEGATIVE mg/dL   Urobilinogen, UA  1.0 0.0 - 1.0 mg/dL   Nitrite NEGATIVE NEGATIVE   Leukocytes, UA SMALL (A) NEGATIVE    Labs Reviewed  POCT URINALYSIS DIP (DEVICE) - Abnormal; Notable for the following components:      Result Value   Glucose, UA 100 (*)    Hgb urine dipstick MODERATE (*)    Protein, ur 30 (*)    Leukocytes, UA SMALL (*)    All other components within normal limits  URINE CULTURE    No results found.     ASSESSMENT & PLAN:  1. Viral URI with cough   2. Essential hypertension   3. Lower urinary tract infectious disease     Meds ordered this encounter  Medications  . cephALEXin (KEFLEX) 500 MG capsule    Sig: Take 1 capsule (500 mg total) by mouth 4 (four) times daily.    Dispense:  20 capsule    Refill:  0    Reviewed  expectations re: course of current medical issues. Questions answered. Outlined signs and symptoms indicating need for more acute intervention. Patient verbalized understanding. After Visit Summary given.    Procedures:      Robyn Haber, MD 07/19/17 1328

## 2017-07-21 LAB — URINE CULTURE: Culture: >=100000 — AB

## 2017-08-07 ENCOUNTER — Ambulatory Visit: Payer: Medicaid Other

## 2017-08-14 ENCOUNTER — Ambulatory Visit (INDEPENDENT_AMBULATORY_CARE_PROVIDER_SITE_OTHER): Payer: Medicaid Other | Admitting: General Practice

## 2017-08-14 DIAGNOSIS — Z309 Encounter for contraceptive management, unspecified: Secondary | ICD-10-CM | POA: Diagnosis not present

## 2017-08-14 DIAGNOSIS — Z3042 Encounter for surveillance of injectable contraceptive: Secondary | ICD-10-CM

## 2017-08-14 NOTE — Progress Notes (Signed)
Depoprovera given

## 2017-11-01 ENCOUNTER — Ambulatory Visit (INDEPENDENT_AMBULATORY_CARE_PROVIDER_SITE_OTHER): Payer: Medicaid Other

## 2017-11-01 VITALS — BP 144/86 | HR 115

## 2017-11-01 DIAGNOSIS — Z309 Encounter for contraceptive management, unspecified: Secondary | ICD-10-CM | POA: Diagnosis present

## 2017-11-01 DIAGNOSIS — Z3042 Encounter for surveillance of injectable contraceptive: Secondary | ICD-10-CM

## 2017-11-01 MED ORDER — MEDROXYPROGESTERONE ACETATE 150 MG/ML IM SUSP
150.0000 mg | Freq: Once | INTRAMUSCULAR | Status: AC
  Administered 2017-11-01: 150 mg via INTRAMUSCULAR

## 2017-11-01 NOTE — Progress Notes (Signed)
Pt presents to the office for a Depo injection. Pt tolerated well. Pt's BP was elevated . Pt states that she had just taken her Norvasc.BP slightly decreased after recheck. Next Depo is due June 7-June 21.

## 2017-12-16 ENCOUNTER — Ambulatory Visit (INDEPENDENT_AMBULATORY_CARE_PROVIDER_SITE_OTHER): Payer: Self-pay

## 2017-12-16 ENCOUNTER — Ambulatory Visit (HOSPITAL_COMMUNITY)
Admission: EM | Admit: 2017-12-16 | Discharge: 2017-12-16 | Disposition: A | Discharge status: Home / Self Care | Payer: Self-pay | Attending: Family Medicine | Admitting: Family Medicine

## 2017-12-16 ENCOUNTER — Encounter (HOSPITAL_COMMUNITY): Payer: Self-pay | Admitting: Family Medicine

## 2017-12-16 DIAGNOSIS — R0602 Shortness of breath: Secondary | ICD-10-CM

## 2017-12-16 DIAGNOSIS — I509 Heart failure, unspecified: Secondary | ICD-10-CM

## 2017-12-16 DIAGNOSIS — R05 Cough: Secondary | ICD-10-CM

## 2017-12-16 LAB — POCT I-STAT, CHEM 8
BUN: <3 mg/dL — ABNORMAL LOW (ref 6–20)
Calcium, Ion: 1.19 mmol/L (ref 1.15–1.40)
Chloride: 103 mmol/L (ref 101–111)
Creatinine, Ser: 0.5 mg/dL (ref 0.44–1.00)
Glucose, Bld: 105 mg/dL — ABNORMAL HIGH (ref 65–99)
HCT: 39 % (ref 36.0–46.0)
Hemoglobin: 13.3 g/dL (ref 12.0–15.0)
Potassium: 3.5 mmol/L (ref 3.5–5.1)
Sodium: 139 mmol/L (ref 135–145)
TCO2: 23 mmol/L (ref 22–32)

## 2017-12-16 MED ORDER — CLONIDINE HCL 0.1 MG PO TABS
0.3000 mg | ORAL_TABLET | Freq: Once | ORAL | Status: AC
  Administered 2017-12-16: 0.3 mg via ORAL

## 2017-12-16 MED ORDER — CLONIDINE HCL 0.1 MG PO TABS
ORAL_TABLET | ORAL | Status: AC
  Filled 2017-12-16: qty 3

## 2017-12-16 MED ORDER — IPRATROPIUM-ALBUTEROL 0.5-2.5 (3) MG/3ML IN SOLN: 3.0000 mL | Freq: Once | RESPIRATORY_TRACT | Status: DC

## 2017-12-16 MED ORDER — FUROSEMIDE 20 MG PO TABS: 20.0000 mg | ORAL_TABLET | Freq: Every day | ORAL | 0 refills | Status: DC

## 2017-12-16 MED ORDER — CLONIDINE HCL 0.1 MG PO TABS: 0.3000 mg | ORAL_TABLET | Freq: Once | ORAL | Status: DC

## 2017-12-16 NOTE — ED Provider Notes (Signed)
[place]    CSN: [id number] Arrival date & time: 12/16/17  1521     History   Chief Complaint Chief Complaint  Patient presents with  . Cough    HPI [name] is a 42 y.o. female.   [name] presents with complaints of worsening cough and shortness of breath  Which started a month ago and has been worsening over the past few days. Today cough has felt persistent. Has been taking robitussin, mucinex, dayquil/nyquil which have not helped. Feels that she has heavy sensation to central chest. Congestion. No sore throat or ear pain. Denies edema. Hx of lupus, recently lost her PCP but has a new PCP appointment set up. Hx of hypertension but has not taken medications today. No history of asthma, does not smoke. Cough at night as well as during the day, occasionally productive. Feels shortness of breath  With activity and with increased cough. Hx htn, lupus, allergies, anemia, enlarged heart, chronic knee pain. She does take daily plaquenil.    ROS per HPI.      Past Medical History:  Diagnosis Date  . Abnormal Pap smear of cervix    colpo, HPV  . Allergy   . Anemia   . Depression    after losses  . Enlarged heart    managed by cardiology  . Fibroid   . Gestational diabetes   . Heart murmur   . Hypertension   . Infection    UTI  . Lupus ([place]) 1999    Patient Active Problem List   Diagnosis Date Noted  . Chronic pain of both knees 04/23/2017  . Fibroids 01/20/2015  . Menorrhagia 01/20/2015  . Low grade squamous intraepithelial lesion (LGSIL) on cervical Pap smear on 01/20/15 01/20/2015  . Atypical glandular cells on cervical Pap smear on 01/20/15 01/20/2015  . Hypertensive urgency 07/15/2013  . Lupus ([place]) 07/15/2013  . Hypertensive emergency 07/15/2013    Past Surgical History:  Procedure Laterality Date  . THERAPEUTIC ABORTION      OB History    Gravida  5   Para  2   Term  0   Preterm  2   AB  3   Living  2     SAB  2   TAB  1    Ectopic  0   Multiple  0   Live Births  2            Home Medications    Prior to Admission medications   Medication Sig Start Date End Date Taking? Authorizing Provider  amLODipine (NORVASC) 10 MG tablet Take 1 tablet (10 mg total) by mouth daily. 03/31/12   Clayton Bibles, PA-C  cloNIDine (CATAPRES) 0.3 MG tablet Take 0.3 mg by mouth 2 (two) times daily.    [provider]  furosemide (LASIX) 20 MG tablet Take 1 tablet (20 mg total) by mouth daily for 3 days. 12/16/17 12/19/17  Zigmund Gottron, NP  hydroxychloroquine (PLAQUENIL) 200 MG tablet Take 200 mg by mouth 2 (two) times daily.    [provider]  ibuprofen (ADVIL,MOTRIN) 600 MG tablet TAKE 1 TABLET (600 MG TOTAL) BY MOUTH EVERY 6 (SIX) HOURS AS NEEDED. 05/29/15   Anyanwu, Sallyanne Havers, MD  metFORMIN (GLUCOPHAGE) 500 MG tablet Take 1 tablet (500 mg total) by mouth 2 (two) times daily with a meal. 12/25/13   Phineas Douglas, Carlos American, PA-C  naproxen (NAPROSYN) 500 MG tablet 1 tablet every 12 hours prn pain 02/24/17  Bjorn Pippin, PA-C  fexofenadine (ALLEGRA) 180 MG tablet Take 180 mg by mouth daily.  02/26/13  [provider]    Family History Family History  Problem Relation Age of Onset  . Cancer Maternal Grandfather        lung  . Diabetes Mother   . Diabetes Maternal Uncle   . Hypertension Maternal Grandmother   . Diabetes Father   . Hearing loss Neg Hx     Social History Social History   Tobacco Use  . Smoking status: Never Smoker  . Smokeless tobacco: Never Used  Substance Use Topics  . Alcohol use: Yes    Comment: rarely  . Drug use: No     Allergies   Septra [bactrim]   Review of Systems Review of Systems   Physical Exam Triage Vital Signs ED Triage Vitals  Enc Vitals Group     BP 12/16/17 1603 (!) 204/119     Pulse Rate 12/16/17 1603 (!) 108     Resp 12/16/17 1603 20     Temp 12/16/17 1603 99.1 F (37.3 C)     Temp src --      SpO2 12/16/17 1603 100 %     Weight --        Height --      Head Circumference --      Peak Flow --      Pain Score 12/16/17 1601 0     Pain Loc --      Pain Edu? --      Excl. in [place]? --    No data found.  Updated Vital Signs BP (!) 164/113   Pulse 91   Temp 99.1 F (37.3 C)   Resp 20   SpO2 100%    Physical Exam  Constitutional: She is oriented to person, place, and time. She appears well-developed and well-nourished. No distress.  HENT:  Head: Normocephalic and atraumatic.  Right Ear: Tympanic membrane, external ear and ear canal normal.  Left Ear: Tympanic membrane, external ear and ear canal normal.  Nose: Nose normal.  Mouth/Throat: Uvula is midline, oropharynx is clear and moist and mucous membranes are normal. No tonsillar exudate.  Eyes: Pupils are equal, round, and reactive to light. Conjunctivae and EOM are normal.  Cardiovascular: Regular rhythm and normal heart sounds. Tachycardia present.  Pulmonary/Chest: Effort normal. She has decreased breath sounds. She has no wheezes.  Occasional strong cough noted; without increased work of breathing; ambulatory without increased work of breathing noted   Musculoskeletal:  Without palpable peripheral edema   Neurological: She is alert and oriented to person, place, and time.  Skin: Skin is warm and dry.   EKG with sore throat with PACs. Without acute stchanges   UC Treatments / Results  Labs (all labs ordered are listed, but only abnormal results are displayed) Labs Reviewed  POCT I-STAT, CHEM 8 - Abnormal; Notable for the following components:      Result Value   BUN <3 (*)    Glucose, Bld 105 (*)    All other components within normal limits    EKG None  Radiology Dg Chest 2 View  Result Date: 12/16/2017 CLINICAL DATA:  Cough and chest congestion for the past month. Nonsmoker. History of lupus. EXAM: CHEST - 2 VIEW COMPARISON:  PA and lateral chest x-ray of November 13, 2014 FINDINGS: The cardiopericardial silhouette remains enlarged. The pulmonary  vascularity is prominent. The interstitial markings are increased. The hemidiaphragms are less well demonstrated today.  The bony structures exhibit no acute abnormality. IMPRESSION: Chronic enlargement of the cardiac silhouette may reflect CHF and or a pericardial effusion. Mild central pulmonary vascular congestion and mild interstitial edema supports CHF. No alveolar pneumonia. An echocardiogram may be useful if this has not been performed in the past. Electronically Signed   By: David  Martinique M.D.   On: 12/16/2017 16:56    Procedures Procedures (including critical care time)  Medications Ordered in UC Medications  cloNIDine (CATAPRES) tablet 0.3 mg (0.3 mg Oral Given 12/16/17 1719)    Initial Impression / Assessment and Plan / UC Course  I have reviewed the triage vital signs and the nursing notes.  Pertinent labs & imaging results that were available during my care of the patient were reviewed by me and considered in my medical decision making (see chart for details).     Patient is non toxic in appearance without increased work of breathing, hypoxia or tachypnea. EGK reassuring. Chest xray concerning for CHF at this time. Patient with elevated BP in clinic today, has not taken her medications. Clonidine provided in clinic with improvement in her heartrate and BP. Encouraged to take as prescribed as this is likely worsening her symptoms. Three days of lasix provided. To recheck here or to recheck with PCP in the next week, as she is scheduled to follow up as a new patient with a new PCP. Return precautions provided. Patient verbalized understanding and agreeable to plan.    Case discussed with supervising physician Dr. Sabra Heck.  Final Clinical Impressions(s) / UC Diagnoses   Final diagnoses:  Congestive heart failure, unspecified HF chronicity, unspecified heart failure type [facility])     Discharge Instructions     Please check your weight daily. If you increase over 5lbs please be  seen. Please take your daily blood pressure medications as prescribed. Please limit salt in your diet.  Please follow up in this clinic or with your primary care provider in the next week for recheck of your blood pressure and symptoms. If you develop increased pain, shortness of breath, nausea, increased work of breathing or otherwise worsening please return or go to Er.     ED Prescriptions    Medication Sig Dispense Auth. Provider   furosemide (LASIX) 20 MG tablet Take 1 tablet (20 mg total) by mouth daily for 3 days. 3 tablet Zigmund Gottron, NP     Controlled Substance Prescriptions [place] Controlled Substance Registry consulted? Not Applicable   Zigmund Gottron, NP 12/16/17 1752

## 2017-12-16 NOTE — ED Triage Notes (Addendum)
Pt here for cough, wheezing and chest tightness x 1 month. Taking OTC meds. She has HTN and didn't take meds today

## 2017-12-16 NOTE — Discharge Instructions (Signed)
Please check your weight daily. If you increase over 5lbs please be seen. Please take your daily blood pressure medications as prescribed. Please limit salt in your diet.  Please follow up in this clinic or with your primary care provider in the next week for recheck of your blood pressure and symptoms. If you develop increased pain, shortness of breath, nausea, increased work of breathing or otherwise worsening please return or go to Er.

## 2017-12-16 NOTE — ED Notes (Signed)
Patient discharged by provider.

## 2017-12-24 ENCOUNTER — Encounter (HOSPITAL_COMMUNITY): Payer: Self-pay | Admitting: Emergency Medicine

## 2017-12-24 ENCOUNTER — Emergency Department (HOSPITAL_COMMUNITY): Payer: Self-pay

## 2017-12-24 ENCOUNTER — Other Ambulatory Visit: Payer: Self-pay

## 2017-12-24 ENCOUNTER — Inpatient Hospital Stay (HOSPITAL_COMMUNITY)
Admission: EM | Admit: 2017-12-24 | Discharge: 2017-12-28 | DRG: 546 | Disposition: A | Discharge status: Home / Self Care | Payer: Self-pay | Attending: Internal Medicine | Admitting: Internal Medicine

## 2017-12-24 DIAGNOSIS — I160 Hypertensive urgency: Secondary | ICD-10-CM | POA: Diagnosis present

## 2017-12-24 DIAGNOSIS — E119 Type 2 diabetes mellitus without complications: Secondary | ICD-10-CM | POA: Diagnosis present

## 2017-12-24 DIAGNOSIS — Z7984 Long term (current) use of oral hypoglycemic drugs: Secondary | ICD-10-CM

## 2017-12-24 DIAGNOSIS — R600 Localized edema: Secondary | ICD-10-CM | POA: Diagnosis present

## 2017-12-24 DIAGNOSIS — I110 Hypertensive heart disease with heart failure: Secondary | ICD-10-CM | POA: Diagnosis present

## 2017-12-24 DIAGNOSIS — R0600 Dyspnea, unspecified: Secondary | ICD-10-CM

## 2017-12-24 DIAGNOSIS — Z6841 Body Mass Index (BMI) 40.0 and over, adult: Secondary | ICD-10-CM

## 2017-12-24 DIAGNOSIS — I509 Heart failure, unspecified: Secondary | ICD-10-CM

## 2017-12-24 DIAGNOSIS — R0609 Other forms of dyspnea: Secondary | ICD-10-CM | POA: Diagnosis present

## 2017-12-24 DIAGNOSIS — R0602 Shortness of breath: Secondary | ICD-10-CM | POA: Diagnosis present

## 2017-12-24 DIAGNOSIS — D649 Anemia, unspecified: Secondary | ICD-10-CM | POA: Diagnosis present

## 2017-12-24 DIAGNOSIS — G8929 Other chronic pain: Secondary | ICD-10-CM | POA: Diagnosis present

## 2017-12-24 DIAGNOSIS — I10 Essential (primary) hypertension: Secondary | ICD-10-CM | POA: Diagnosis present

## 2017-12-24 DIAGNOSIS — D509 Iron deficiency anemia, unspecified: Secondary | ICD-10-CM | POA: Diagnosis present

## 2017-12-24 DIAGNOSIS — E1165 Type 2 diabetes mellitus with hyperglycemia: Secondary | ICD-10-CM

## 2017-12-24 DIAGNOSIS — Z888 Allergy status to other drugs, medicaments and biological substances status: Secondary | ICD-10-CM

## 2017-12-24 DIAGNOSIS — Z8249 Family history of ischemic heart disease and other diseases of the circulatory system: Secondary | ICD-10-CM

## 2017-12-24 DIAGNOSIS — Z30013 Encounter for initial prescription of injectable contraceptive: Secondary | ICD-10-CM

## 2017-12-24 DIAGNOSIS — R748 Abnormal levels of other serum enzymes: Secondary | ICD-10-CM | POA: Diagnosis present

## 2017-12-24 DIAGNOSIS — M329 Systemic lupus erythematosus, unspecified: Secondary | ICD-10-CM | POA: Diagnosis present

## 2017-12-24 DIAGNOSIS — E876 Hypokalemia: Secondary | ICD-10-CM | POA: Diagnosis present

## 2017-12-24 DIAGNOSIS — Z79899 Other long term (current) drug therapy: Secondary | ICD-10-CM

## 2017-12-24 DIAGNOSIS — I5032 Chronic diastolic (congestive) heart failure: Secondary | ICD-10-CM | POA: Diagnosis present

## 2017-12-24 DIAGNOSIS — M3212 Pericarditis in systemic lupus erythematosus: Principal | ICD-10-CM | POA: Diagnosis present

## 2017-12-24 DIAGNOSIS — F329 Major depressive disorder, single episode, unspecified: Secondary | ICD-10-CM | POA: Diagnosis present

## 2017-12-24 DIAGNOSIS — Z833 Family history of diabetes mellitus: Secondary | ICD-10-CM

## 2017-12-24 LAB — CBC
HCT: 32.9 % — ABNORMAL LOW (ref 36.0–46.0)
Hemoglobin: 10.7 g/dL — ABNORMAL LOW (ref 12.0–15.0)
MCH: 24.9 pg — ABNORMAL LOW (ref 26.0–34.0)
MCHC: 32.5 g/dL (ref 30.0–36.0)
MCV: 76.5 fL — ABNORMAL LOW (ref 78.0–100.0)
Platelets: 333 10*3/uL (ref 150–400)
RBC: 4.3 MIL/uL (ref 3.87–5.11)
RDW: 15.6 % — ABNORMAL HIGH (ref 11.5–15.5)
WBC: 5 10*3/uL (ref 4.0–10.5)

## 2017-12-24 LAB — BASIC METABOLIC PANEL
Anion gap: 9 (ref 5–15)
BUN: 7 mg/dL (ref 6–20)
CO2: 21 mmol/L — ABNORMAL LOW (ref 22–32)
Calcium: 9.2 mg/dL (ref 8.9–10.3)
Chloride: 109 mmol/L (ref 101–111)
Creatinine, Ser: 0.65 mg/dL (ref 0.44–1.00)
GFR calc Af Amer: >60 mL/min (ref >60)
GFR calc non Af Amer: >60 mL/min (ref >60)
Glucose, Bld: 121 mg/dL — ABNORMAL HIGH (ref 65–99)
Potassium: 3.9 mmol/L (ref 3.5–5.1)
Sodium: 139 mmol/L (ref 135–145)

## 2017-12-24 LAB — I-STAT TROPONIN, ED
Troponin i, poc: 0.05 ng/mL (ref 0.00–0.08)
Troponin i, poc: 0.08 ng/mL (ref 0.00–0.08)

## 2017-12-24 LAB — I-STAT BETA HCG BLOOD, ED (MC, WL, AP ONLY): I-stat hCG, quantitative: <5 m[IU]/mL (ref <5)

## 2017-12-24 LAB — BRAIN NATRIURETIC PEPTIDE: B Natriuretic Peptide: 228.8 pg/mL — ABNORMAL HIGH (ref 0.0–100.0)

## 2017-12-24 MED ORDER — FUROSEMIDE 10 MG/ML IJ SOLN
40.0000 mg | INTRAMUSCULAR | Status: AC
  Administered 2017-12-24: 40 mg via INTRAVENOUS
  Filled 2017-12-24: qty 4

## 2017-12-24 NOTE — ED Provider Notes (Signed)
[facility] EMERGENCY DEPARTMENT Provider Note   CSN: [id number] Arrival date & time: 12/24/17  1811     History   Chief Complaint Chief Complaint  Patient presents with  . Shortness of Breath  . Leg Swelling  . Congestive Heart Failure    HPI [name] is a 42 y.o. female.  Patient with past medical history of hypertension, lupus, presents to the emergency department with a chief complaint of shortness of breath and dyspnea on exertion.  She reports that she was recently diagnosed with fluid around her lungs at urgent care about a week ago.  She was started on Lasix.  She was encouraged to follow-up with her doctor, but she currently does not have a doctor because he lost his license.  She is working on establishing care elsewhere.  She denies associated chest pain, but does report severe lower extremity swelling.  She reports associated cough, but denies any fever.  She denies any other associated symptoms.  The history is provided by the patient. No language interpreter was used.    Past Medical History:  Diagnosis Date  . Abnormal Pap smear of cervix    colpo, HPV  . Allergy   . Anemia   . Depression    after losses  . Enlarged heart    managed by cardiology  . Fibroid   . Gestational diabetes   . Heart murmur   . Hypertension   . Infection    UTI  . Lupus ([place]) 1999    Patient Active Problem List   Diagnosis Date Noted  . Chronic pain of both knees 04/23/2017  . Fibroids 01/20/2015  . Menorrhagia 01/20/2015  . Low grade squamous intraepithelial lesion (LGSIL) on cervical Pap smear on 01/20/15 01/20/2015  . Atypical glandular cells on cervical Pap smear on 01/20/15 01/20/2015  . Hypertensive urgency 07/15/2013  . Lupus ([place]) 07/15/2013  . Hypertensive emergency 07/15/2013    Past Surgical History:  Procedure Laterality Date  . THERAPEUTIC ABORTION       OB History    Gravida  5   Para  2   Term  0   Preterm  2   AB  3     Living  2     SAB  2   TAB  1   Ectopic  0   Multiple  0   Live Births  2            Home Medications    Prior to Admission medications   Medication Sig Start Date End Date Taking? Authorizing Provider  amLODipine (NORVASC) 10 MG tablet Take 1 tablet (10 mg total) by mouth daily. 03/31/12   Clayton Bibles, PA-C  cloNIDine (CATAPRES) 0.3 MG tablet Take 0.3 mg by mouth 2 (two) times daily.    [provider]  furosemide (LASIX) 20 MG tablet Take 1 tablet (20 mg total) by mouth daily for 3 days. 12/16/17 12/19/17  Zigmund Gottron, NP  hydroxychloroquine (PLAQUENIL) 200 MG tablet Take 200 mg by mouth 2 (two) times daily.    [provider]  ibuprofen (ADVIL,MOTRIN) 600 MG tablet TAKE 1 TABLET (600 MG TOTAL) BY MOUTH EVERY 6 (SIX) HOURS AS NEEDED. 05/29/15   Anyanwu, Sallyanne Havers, MD  metFORMIN (GLUCOPHAGE) 500 MG tablet Take 1 tablet (500 mg total) by mouth 2 (two) times daily with a meal. 12/25/13   Palmer, Carlos American, PA-C  naproxen (NAPROSYN) 500 MG tablet 1 tablet every 12 hours prn  pain 02/24/17   Bjorn Pippin, PA-C    Family History Family History  Problem Relation Age of Onset  . Cancer Maternal Grandfather        lung  . Diabetes Mother   . Diabetes Maternal Uncle   . Hypertension Maternal Grandmother   . Diabetes Father   . Hearing loss Neg Hx     Social History Social History   Tobacco Use  . Smoking status: Never Smoker  . Smokeless tobacco: Never Used  Substance Use Topics  . Alcohol use: Yes    Comment: rarely  . Drug use: No     Allergies   Septra [bactrim]   Review of Systems Review of Systems  All other systems reviewed and are negative.    Physical Exam Updated Vital Signs BP (!) 139/92 (BP Location: Right Arm)   Pulse 88   Temp 98.6 F (37 C) (Oral)   Resp 18   SpO2 99%   Physical Exam  Constitutional: She is oriented to person, place, and time. She appears well-developed and well-nourished.  HENT:  Head:  Normocephalic and atraumatic.  Eyes: Pupils are equal, round, and reactive to light. Conjunctivae and EOM are normal.  Neck: Normal range of motion. Neck supple.  Cardiovascular: Normal rate and regular rhythm. Exam reveals no gallop and no friction rub.  No murmur heard. Pulmonary/Chest: Effort normal and breath sounds normal. No respiratory distress. She has no wheezes. She has no rales. She exhibits no tenderness.  Abdominal: Soft. Bowel sounds are normal. She exhibits no distension and no mass. There is no tenderness. There is no rebound and no guarding.  Musculoskeletal: Normal range of motion. She exhibits no tenderness.       Right lower leg: She exhibits edema.       Left lower leg: She exhibits edema.  Neurological: She is alert and oriented to person, place, and time.  Skin: Skin is warm and dry.  Psychiatric: She has a normal mood and affect. Her behavior is normal. Judgment and thought content normal.  Nursing note and vitals reviewed.    ED Treatments / Results  Labs (all labs ordered are listed, but only abnormal results are displayed) Labs Reviewed  BASIC METABOLIC PANEL - Abnormal; Notable for the following components:      Result Value   CO2 21 (*)    Glucose, Bld 121 (*)    All other components within normal limits  CBC - Abnormal; Notable for the following components:   Hemoglobin 10.7 (*)    HCT 32.9 (*)    MCV 76.5 (*)    MCH 24.9 (*)    RDW 15.6 (*)    All other components within normal limits  BRAIN NATRIURETIC PEPTIDE - Abnormal; Notable for the following components:   B Natriuretic Peptide 228.8 (*)    All other components within normal limits  I-STAT TROPONIN, ED  I-STAT BETA HCG BLOOD, ED ([facility], [facility], [facility] ONLY)  I-STAT TROPONIN, ED    EKG None  Radiology Dg Chest 2 View  Result Date: 12/24/2017 CLINICAL DATA:  Shortness of breath and bilateral lower extremity swelling. EXAM: CHEST - 2 VIEW COMPARISON:  PA and lateral chest 12/16/2017 and  11/12/2017. FINDINGS: Marked enlargement of the cardiopericardial silhouette is unchanged. There is mild basilar atelectasis. Mild vascular congestion without edema is noted. Aortic atherosclerosis is seen. No bony abnormality. IMPRESSION: No acute disease. Marked enlargement of the cardiopericardial silhouette consistent with cardiomegaly and/or pericardial effusion is chronic and unchanged.  Pulmonary vascular congestion without edema. Electronically Signed   By: Inge Rise M.D.   On: 12/24/2017 18:51    Procedures Procedures (including critical care time)  Medications Ordered in ED Medications  furosemide (LASIX) injection 40 mg (has no administration in time range)     Initial Impression / Assessment and Plan / ED Course  I have reviewed the triage vital signs and the nursing notes.  Pertinent labs & imaging results that were available during my care of the patient were reviewed by me and considered in my medical decision making (see chart for details).     Patient with shortness of breath and lower extremity swelling.  Recently had a chest x-ray at urgent care which showed fluid around her lungs.  Chest x-ray today shows increased vascular congestion without edema.  She reports severe dyspnea with exertion.  She has been taking Lasix, but has not had any relief of her symptoms.  We will give IV Lasix.  Will ambulate with O2 monitor, and will reassess.  BNP is elevated.  Troponin was 0.05, will repeat this.  Repeat troponin is now 0.08, which isn't a critical result, but it is trending up.  She also desats to 83% on RA with ambulation.  Will need admission.  Discussed with Dr. Leonides Schanz, who agrees with the plan.  Appreciate Dr. Jonelle Sidle for admitting the patient.    Final Clinical Impressions(s) / ED Diagnoses   Final diagnoses:  Congestive heart failure, unspecified HF chronicity, unspecified heart failure type [facility])  Dyspnea on exertion    ED Discharge Orders    None         Montine Circle, PA-C 12/25/17 0019    Ward, Delice Bison, DO 12/25/17 0030

## 2017-12-24 NOTE — ED Notes (Signed)
ED Provider at bedside. 

## 2017-12-24 NOTE — ED Notes (Signed)
Walked pt in hallways. O2 dropped to 83%. Came  Back up to 96% on RA after 3 minutes of resting.

## 2017-12-24 NOTE — ED Triage Notes (Addendum)
Pt states recent diagnosis of CHF. Pt reports several says of shortness of breath and swelling to legs. Some chest pain to central chest. Pt also reports cough, non productive. Pt eyelids appear swollen but pt is not endorsing facial edema.

## 2017-12-25 ENCOUNTER — Other Ambulatory Visit: Payer: Self-pay

## 2017-12-25 ENCOUNTER — Encounter (HOSPITAL_COMMUNITY): Payer: Self-pay

## 2017-12-25 ENCOUNTER — Inpatient Hospital Stay (HOSPITAL_COMMUNITY): Payer: Self-pay

## 2017-12-25 DIAGNOSIS — R0602 Shortness of breath: Secondary | ICD-10-CM | POA: Diagnosis present

## 2017-12-25 DIAGNOSIS — R600 Localized edema: Secondary | ICD-10-CM | POA: Diagnosis present

## 2017-12-25 DIAGNOSIS — D509 Iron deficiency anemia, unspecified: Secondary | ICD-10-CM | POA: Diagnosis present

## 2017-12-25 DIAGNOSIS — I10 Essential (primary) hypertension: Secondary | ICD-10-CM | POA: Diagnosis present

## 2017-12-25 DIAGNOSIS — E1165 Type 2 diabetes mellitus with hyperglycemia: Secondary | ICD-10-CM

## 2017-12-25 DIAGNOSIS — I351 Nonrheumatic aortic (valve) insufficiency: Secondary | ICD-10-CM

## 2017-12-25 DIAGNOSIS — E119 Type 2 diabetes mellitus without complications: Secondary | ICD-10-CM

## 2017-12-25 LAB — COMPREHENSIVE METABOLIC PANEL
ALT: 11 U/L — ABNORMAL LOW (ref 14–54)
AST: 16 U/L (ref 15–41)
Albumin: 3.3 g/dL — ABNORMAL LOW (ref 3.5–5.0)
Alkaline Phosphatase: 51 U/L (ref 38–126)
Anion gap: 11 (ref 5–15)
BUN: 7 mg/dL (ref 6–20)
CO2: 20 mmol/L — ABNORMAL LOW (ref 22–32)
Calcium: 9 mg/dL (ref 8.9–10.3)
Chloride: 106 mmol/L (ref 101–111)
Creatinine, Ser: 0.81 mg/dL (ref 0.44–1.00)
GFR calc Af Amer: >60 mL/min (ref >60)
GFR calc non Af Amer: >60 mL/min (ref >60)
Glucose, Bld: 131 mg/dL — ABNORMAL HIGH (ref 65–99)
Potassium: 3.4 mmol/L — ABNORMAL LOW (ref 3.5–5.1)
Sodium: 137 mmol/L (ref 135–145)
Total Bilirubin: 0.7 mg/dL (ref 0.3–1.2)
Total Protein: 7.6 g/dL (ref 6.5–8.1)

## 2017-12-25 LAB — CBC WITH DIFFERENTIAL/PLATELET
Abs Immature Granulocytes: 0 10*3/uL (ref 0.0–0.1)
Basophils Absolute: 0 10*3/uL (ref 0.0–0.1)
Basophils Relative: 1 %
Eosinophils Absolute: 0.3 10*3/uL (ref 0.0–0.7)
Eosinophils Relative: 5 %
HCT: 32.9 % — ABNORMAL LOW (ref 36.0–46.0)
Hemoglobin: 10.8 g/dL — ABNORMAL LOW (ref 12.0–15.0)
Immature Granulocytes: 0 %
Lymphocytes Relative: 21 %
Lymphs Abs: 1.1 10*3/uL (ref 0.7–4.0)
MCH: 24.9 pg — ABNORMAL LOW (ref 26.0–34.0)
MCHC: 32.8 g/dL (ref 30.0–36.0)
MCV: 75.8 fL — ABNORMAL LOW (ref 78.0–100.0)
Monocytes Absolute: 0.7 10*3/uL (ref 0.1–1.0)
Monocytes Relative: 14 %
Neutro Abs: 2.9 10*3/uL (ref 1.7–7.7)
Neutrophils Relative %: 59 %
Platelets: 328 10*3/uL (ref 150–400)
RBC: 4.34 MIL/uL (ref 3.87–5.11)
RDW: 15.4 % (ref 11.5–15.5)
WBC: 5 10*3/uL (ref 4.0–10.5)

## 2017-12-25 LAB — GLUCOSE, CAPILLARY
Glucose-Capillary: 113 mg/dL — ABNORMAL HIGH (ref 65–99)
Glucose-Capillary: 113 mg/dL — ABNORMAL HIGH (ref 65–99)
Glucose-Capillary: 92 mg/dL (ref 65–99)
Glucose-Capillary: 236 mg/dL — ABNORMAL HIGH (ref 65–99)

## 2017-12-25 LAB — ECHOCARDIOGRAM COMPLETE
Height: 60 in
Weight: 3520 oz

## 2017-12-25 LAB — CBC
HCT: 35.1 % — ABNORMAL LOW (ref 36.0–46.0)
Hemoglobin: 11.4 g/dL — ABNORMAL LOW (ref 12.0–15.0)
MCH: 24.9 pg — ABNORMAL LOW (ref 26.0–34.0)
MCHC: 32.5 g/dL (ref 30.0–36.0)
MCV: 76.8 fL — ABNORMAL LOW (ref 78.0–100.0)
Platelets: 344 10*3/uL (ref 150–400)
RBC: 4.57 MIL/uL (ref 3.87–5.11)
RDW: 15.8 % — ABNORMAL HIGH (ref 11.5–15.5)
WBC: 5.2 10*3/uL (ref 4.0–10.5)

## 2017-12-25 LAB — CREATININE, SERUM
Creatinine, Ser: 0.91 mg/dL (ref 0.44–1.00)
GFR calc Af Amer: >60 mL/min (ref >60)
GFR calc non Af Amer: >60 mL/min (ref >60)

## 2017-12-25 LAB — TROPONIN I
Troponin I: 0.06 ng/mL (ref <0.03)
Troponin I: 0.05 ng/mL (ref <0.03)
Troponin I: 0.04 ng/mL (ref <0.03)

## 2017-12-25 LAB — TSH: TSH: 5.026 u[IU]/mL — ABNORMAL HIGH (ref 0.350–4.500)

## 2017-12-25 LAB — MAGNESIUM: Magnesium: 1.2 mg/dL — ABNORMAL LOW (ref 1.7–2.4)

## 2017-12-25 LAB — HEMOGLOBIN A1C
Hgb A1c MFr Bld: 6.9 % — ABNORMAL HIGH (ref 4.8–5.6)
Mean Plasma Glucose: 151.33 mg/dL

## 2017-12-25 MED ORDER — FUROSEMIDE 20 MG PO TABS: 20.0000 mg | ORAL_TABLET | Freq: Every day | ORAL | Status: DC

## 2017-12-25 MED ORDER — SODIUM CHLORIDE 0.9 % IV SOLN: 250.0000 mL | INTRAVENOUS | Status: DC | PRN

## 2017-12-25 MED ORDER — SODIUM CHLORIDE 0.9% FLUSH: 3.0000 mL | INTRAVENOUS | Status: DC | PRN

## 2017-12-25 MED ORDER — ONDANSETRON HCL 4 MG/2ML IJ SOLN: 4.0000 mg | Freq: Four times a day (QID) | INTRAMUSCULAR | Status: DC | PRN

## 2017-12-25 MED ORDER — CLONIDINE HCL 0.3 MG PO TABS
0.3000 mg | ORAL_TABLET | Freq: Two times a day (BID) | ORAL | Status: DC
  Administered 2017-12-25 (×2): 0.3 mg via ORAL
  Filled 2017-12-25 (×2): qty 1

## 2017-12-25 MED ORDER — AMLODIPINE BESYLATE 10 MG PO TABS
10.0000 mg | ORAL_TABLET | Freq: Every day | ORAL | Status: DC
  Administered 2017-12-26 – 2017-12-28 (×3): 10 mg via ORAL
  Filled 2017-12-25 (×3): qty 1

## 2017-12-25 MED ORDER — ENOXAPARIN SODIUM 40 MG/0.4ML ~~LOC~~ SOLN
40.0000 mg | Freq: Every day | SUBCUTANEOUS | Status: DC
  Administered 2017-12-25 – 2017-12-28 (×4): 40 mg via SUBCUTANEOUS
  Filled 2017-12-25 (×4): qty 0.4

## 2017-12-25 MED ORDER — FUROSEMIDE 10 MG/ML IJ SOLN
40.0000 mg | Freq: Two times a day (BID) | INTRAMUSCULAR | Status: DC
  Administered 2017-12-25: 40 mg via INTRAVENOUS
  Filled 2017-12-25: qty 4

## 2017-12-25 MED ORDER — INSULIN ASPART 100 UNIT/ML ~~LOC~~ SOLN
0.0000 [IU] | Freq: Three times a day (TID) | SUBCUTANEOUS | Status: DC
  Administered 2017-12-26: 2 [IU] via SUBCUTANEOUS
  Administered 2017-12-26: 1 [IU] via SUBCUTANEOUS
  Administered 2017-12-26: 2 [IU] via SUBCUTANEOUS
  Administered 2017-12-27: 1 [IU] via SUBCUTANEOUS
  Administered 2017-12-27 (×2): 3 [IU] via SUBCUTANEOUS
  Administered 2017-12-28: 1 [IU] via SUBCUTANEOUS
  Administered 2017-12-28: 3 [IU] via SUBCUTANEOUS

## 2017-12-25 MED ORDER — METHYLPREDNISOLONE SODIUM SUCC 125 MG IJ SOLR
60.0000 mg | Freq: Every day | INTRAMUSCULAR | Status: DC
  Administered 2017-12-25 – 2017-12-28 (×4): 60 mg via INTRAVENOUS
  Filled 2017-12-25 (×4): qty 2

## 2017-12-25 MED ORDER — CARVEDILOL 3.125 MG PO TABS
3.1250 mg | ORAL_TABLET | Freq: Two times a day (BID) | ORAL | Status: DC
  Administered 2017-12-25: 3.125 mg via ORAL
  Filled 2017-12-25 (×2): qty 1

## 2017-12-25 MED ORDER — CLONIDINE HCL 0.3 MG PO TABS
0.3000 mg | ORAL_TABLET | Freq: Two times a day (BID) | ORAL | Status: DC
  Administered 2017-12-25 – 2017-12-28 (×6): 0.3 mg via ORAL
  Filled 2017-12-25 (×6): qty 1

## 2017-12-25 MED ORDER — POTASSIUM CHLORIDE CRYS ER 20 MEQ PO TBCR
40.0000 meq | EXTENDED_RELEASE_TABLET | Freq: Once | ORAL | Status: AC
  Administered 2017-12-25: 40 meq via ORAL
  Filled 2017-12-25: qty 4

## 2017-12-25 MED ORDER — ACETAMINOPHEN 325 MG PO TABS
650.0000 mg | ORAL_TABLET | ORAL | Status: DC | PRN
  Administered 2017-12-25 – 2017-12-26 (×4): 650 mg via ORAL
  Filled 2017-12-25 (×3): qty 2

## 2017-12-25 MED ORDER — MEDROXYPROGESTERONE ACETATE 150 MG/ML IM SUSP: 150.0000 mg | INTRAMUSCULAR | Status: DC

## 2017-12-25 MED ORDER — HYDROXYCHLOROQUINE SULFATE 200 MG PO TABS
200.0000 mg | ORAL_TABLET | Freq: Two times a day (BID) | ORAL | Status: DC
  Administered 2017-12-25 – 2017-12-28 (×8): 200 mg via ORAL
  Filled 2017-12-25 (×11): qty 1

## 2017-12-25 MED ORDER — LISINOPRIL 10 MG PO TABS
10.0000 mg | ORAL_TABLET | Freq: Every day | ORAL | Status: DC
  Administered 2017-12-25: 10 mg via ORAL
  Filled 2017-12-25: qty 1

## 2017-12-25 MED ORDER — SODIUM CHLORIDE 0.9% FLUSH
3.0000 mL | Freq: Two times a day (BID) | INTRAVENOUS | Status: DC
  Administered 2017-12-25 (×2): 3 mL via INTRAVENOUS

## 2017-12-25 MED ORDER — AMLODIPINE BESYLATE 10 MG PO TABS
10.0000 mg | ORAL_TABLET | Freq: Every day | ORAL | Status: DC
  Administered 2017-12-25: 10 mg via ORAL
  Filled 2017-12-25: qty 1

## 2017-12-25 NOTE — Progress Notes (Signed)
PROGRESS NOTE    [name]  WFU:[id number] DOB: May 01, 1976 DOA: 12/24/2017 PCP: [name], MD      Brief Narrative:  [name] is a 42 y.o. F with Lupus on hydroxychloroquine, HTN, and dCHF who presents with slowly progressive dyspnea on exertion and cough for a week or two.  One week ago, seen in Urgent Care, CXR showed enlarged cardiac silhouette, edema, started on Lasix.  Did not start it, but started to notice progressive leg swelling, finally came to the ER.   Assessment & Plan:   Likely Lupus flare Pericarditis, as well as new skin rashes some small hypopigmented spots, appearing in the last few weeks) as well as flaring of her hand joint pains symmetrically. -Solu-Medrol IV -Continue Plaquenil   Acute pericarditis Elevated troponin Moderate effusion on echo without tamponade features.   -Start Solu-Medrol -Continue Plaquenil -Check complements, anti-double-stranded DNA titer   Chronic diastolic CHF Echo with normal EF.  Grade 2 DD.  BNP >200. -Hold Lasix -Strict I/Os, daily weights, telemetry   Hypertension -Continue amlodipine, clonidine  Diabetes Hemoglobin A1c 6.9%, off metformin. -Sliding scale corrections  Anemia From chronic disease  Hypokalemia -Supplement K     DVT prophylaxis: Lovenox Code Status: FULl Family Communication: None presnet MDM and disposition Plan: The below labs and imaging reports were reviewed and summarized above.   She was admitted with dyspnea on exertion, clinical CHF.  Now found to have pericarditis, likely lupus flare.  Will start IV steroids.  The patient's status is clinically improving.     Consultants:   None  Procedures:   Echocardiogram 5/15 Study Conclusions  - Left ventricle: The cavity size was normal. There was moderate   concentric hypertrophy. Systolic function was vigorous. The   estimated ejection fraction was in the range of 65% to 70%. Wall   motion was normal; there were no  regional wall motion   abnormalities. Features are consistent with a pseudonormal left   ventricular filling pattern, with concomitant abnormal relaxation   and increased filling pressure (grade 2 diastolic dysfunction).   Doppler parameters are consistent with high ventricular filling   pressure. - Aortic valve: There was very mild stenosis. Mean gradient (S): 10   mm Hg. Valve area (VTI): 2.11 cm^2. Valve area (Vmax): 2.26 cm^2.   Valve area (Vmean): 1.98 cm^2. - Mitral valve: There was trivial regurgitation. - Left atrium: The atrium was severely dilated. - Tricuspid valve: There was trivial regurgitation. - Inferior vena cava: The vessel was mildly dilated. The   respirophasic diameter changes were in the normal range (>= 50%). - Pericardium, extracardiac: A moderate, free-flowing pericardial   effusion 2.22 x 1.81cm that was identified circumferential to the   heart. The fluid had no internal echoes. There was no chamber   collapse. There was evidence for mildly increased RV-LV   interaction demonstrated by respirophasic changes in transmitral   velocities.     Antimicrobials:   None    Subjective: She slept well, actually feels a little better after Lasix.  Chest pain is mild, only with exertion, not positional.  No dizziness with standing.  No confusion, weakness.  No fever, chills.  She has had some new hypopigmented skin rashes lately, as well as flaring of her joint pain inher hands.  Objective: Vitals:   12/25/17 0030 12/25/17 0127 12/25/17 0351 12/25/17 1207  BP: (!) 162/86 (!) 185/99 (!) 152/89 99/77  Pulse: 88 93 83 82  Resp: 19 16 12 20   Temp:  98.6 F (37 C) 98.6 F (37 C) 98.4 F (36.9 C)  TempSrc:  Oral Oral Oral  SpO2: 100% 91% 98% 100%  Weight:  99.8 kg (220 lb)    Height:  5' (1.524 m)      Intake/Output Summary (Last 24 hours) at 12/25/2017 1446 Last data filed at 12/25/2017 1239 Gross per 24 hour  Intake 480 ml  Output 500 ml  Net -20 ml    Filed Weights   12/25/17 0127  Weight: 99.8 kg (220 lb)    Examination: General appearance: Obese adult female, alert and in no acute distress.  Lying in bed, interactive. HEENT: Anicteric, conjunctiva pink, lids and lashes normal. No nasal deformity, discharge, epistaxis.  Lips moist, teeth normal, OP moist, no oropharyngeal lesions.   Skin: Warm and dry.  Scattered, small hypopigmented macules. Cardiac: RRR, nl S1-S2, no friction rub appreciated.  Capillary refill is brisk.  JVP not visible.  Non-pitting LE edema.  Radial and DP pulses 2+ and symmetric. Respiratory: Normal respiratory rate and rhythm.  Crackles at bases. Abdomen: Abdomen soft.  No TTP. No ascites, distension, hepatosplenomegaly.   MSK: No deformities or effusions. Neuro: Awake and alert.  EOMI, moves all extremities. Speech fluent.    Psych: Sensorium intact and responding to questions, attention normal. Affect normal.  Judgment and insight appear normal.    Data Reviewed: I have personally reviewed following labs and imaging studies:  CBC: Recent Labs  Lab 12/24/17 1840 12/25/17 0206 12/25/17 0553  WBC 5.0 5.2 5.0  NEUTROABS  --   --  2.9  HGB 10.7* 11.4* 10.8*  HCT 32.9* 35.1* 32.9*  MCV 76.5* 76.8* 75.8*  PLT 333 344 765   Basic Metabolic Panel: Recent Labs  Lab 12/24/17 1840 12/25/17 0206 12/25/17 0553  NA 139  --  137  K 3.9  --  3.4*  CL 109  --  106  CO2 21*  --  20*  GLUCOSE 121*  --  131*  BUN 7  --  7  CREATININE 0.65 0.91 0.81  CALCIUM 9.2  --  9.0  MG  --  1.2*  --    GFR: Estimated Creatinine Clearance: 96 mL/min (by C-G formula based on SCr of 0.81 mg/dL). Liver Function Tests: Recent Labs  Lab 12/25/17 0553  AST 16  ALT 11*  ALKPHOS 51  BILITOT 0.7  PROT 7.6  ALBUMIN 3.3*   No results for input(s): LIPASE, AMYLASE in the last 168 hours. No results for input(s): AMMONIA in the last 168 hours. Coagulation Profile: No results for input(s): INR, PROTIME in the last 168  hours. Cardiac Enzymes: Recent Labs  Lab 12/25/17 0206 12/25/17 0553 12/25/17 1208  TROPONINI 0.06* 0.05* 0.04*   BNP (last 3 results) No results for input(s): PROBNP in the last 8760 hours. HbA1C: Recent Labs    12/25/17 0206  HGBA1C 6.9*   CBG: Recent Labs  Lab 12/25/17 0743 12/25/17 1143  GLUCAP 113* 113*   Lipid Profile: No results for input(s): CHOL, HDL, LDLCALC, TRIG, CHOLHDL, LDLDIRECT in the last 72 hours. Thyroid Function Tests: Recent Labs    12/25/17 0206  TSH 5.026*   Anemia Panel: No results for input(s): VITAMINB12, FOLATE, FERRITIN, TIBC, IRON, RETICCTPCT in the last 72 hours. Urine analysis:    Component Value Date/Time   COLORURINE YELLOW 01/02/2017 [place] 01/02/2017 1433   LABSPEC 1.020 07/19/2017 1317   PHURINE 7.0 07/19/2017 1317   GLUCOSEU 100 (A) 07/19/2017 1317  HGBUR MODERATE (A) 07/19/2017 1317   BILIRUBINUR NEGATIVE 07/19/2017 1317   KETONESUR NEGATIVE 07/19/2017 1317   PROTEINUR 30 (A) 07/19/2017 1317   UROBILINOGEN 1.0 07/19/2017 1317   NITRITE NEGATIVE 07/19/2017 1317   LEUKOCYTESUR SMALL (A) 07/19/2017 1317   Sepsis Labs: @LABRCNTIP (TDVVOHYWVPXTG:6,YIRSWNIOEVOJJ:0)  )No results found for this or any previous visit (from the past 240 hour(s)).       Radiology Studies: Dg Chest 2 View  Result Date: 12/24/2017 CLINICAL DATA:  Shortness of breath and bilateral lower extremity swelling. EXAM: CHEST - 2 VIEW COMPARISON:  PA and lateral chest 12/16/2017 and 11/12/2017. FINDINGS: Marked enlargement of the cardiopericardial silhouette is unchanged. There is mild basilar atelectasis. Mild vascular congestion without edema is noted. Aortic atherosclerosis is seen. No bony abnormality. IMPRESSION: No acute disease. Marked enlargement of the cardiopericardial silhouette consistent with cardiomegaly and/or pericardial effusion is chronic and unchanged. Pulmonary vascular congestion without edema. Electronically Signed    By: Inge Rise M.D.   On: 12/24/2017 18:51        Scheduled Meds: . amLODipine  10 mg Oral Daily  . carvedilol  3.125 mg Oral BID WC  . cloNIDine  0.3 mg Oral BID  . enoxaparin (LOVENOX) injection  40 mg Subcutaneous Daily  . furosemide  40 mg Intravenous BID  . hydroxychloroquine  200 mg Oral BID  . insulin aspart  0-9 Units Subcutaneous TID WC  . lisinopril  10 mg Oral Daily  . sodium chloride flush  3 mL Intravenous Q12H   Continuous Infusions: . sodium chloride       LOS: 0 days    Time spent: 25 minutes    Edwin Dada, MD [facility] 12/25/2017, 10:45 AM     Pager [phone] --- please page though AMION:  [email or web address] If 7PM-7AM, please contact night-coverage

## 2017-12-25 NOTE — Plan of Care (Signed)
  Problem: Activity: Goal: Risk for activity intolerance will decrease Outcome: Progressing   Problem: Safety: Goal: Ability to remain free from injury will improve Outcome: Progressing   Problem: Activity: Goal: Capacity to carry out activities will improve Outcome: Progressing   

## 2017-12-25 NOTE — H&P (Signed)
History and Physical    [name]:[id number] DOB: [birth year] DOA: 12/24/2017  Referring MD/NP/PA: Montine Circle, PA PCP: [name], MD   Patient coming from: home  Chief Complaint: shortness of breath  HPI: [name] is a 42 y.o. female with medical history significant of loop loose as well as extensive hypertension with depression and chronic anemia presenting with progressive shortness of breath with exertion associated with generalized weakness as well as cough. Symptoms have been going on and off for over a week. She was seen at urgent care center where she was diagnosed with pleural effusion about a week ago and started on Lasix. She has not been able to initiate care but noted progressive swelling over lower extremities bilaterally. Patient is gradually becoming unable to do her ADLs due to shortness of breath. She decided to come to the ER for evaluation.  ED Course: patient had blood pressure of 157/92 was pulse 98. Oxygen saturation is 97% on 2 L per minute. Glucose is 121 with proBNP of 228. Hemoglobin is 10.7 white count 5.0 and platelets 333. Chest x-ray showed pulmonary vascular congestion  Review of Systems: As per HPI otherwise 10 point review of systems negative.    Past Medical History:  Diagnosis Date  . Abnormal Pap smear of cervix    colpo, HPV  . Allergy   . Anemia   . Depression    after losses  . Enlarged heart    managed by cardiology  . Fibroid   . Gestational diabetes   . Heart murmur   . Hypertension   . Infection    UTI  . Lupus ([place]) 1999    Past Surgical History:  Procedure Laterality Date  . THERAPEUTIC ABORTION       reports that she has never smoked. She has never used smokeless tobacco. She reports that she drinks alcohol. She reports that she does not use drugs.  Allergies  Allergen Reactions  . Septra [Bactrim] Hives    Family History  Problem Relation Age of Onset  . Cancer Maternal Grandfather    lung  . Diabetes Mother   . Diabetes Maternal Uncle   . Hypertension Maternal Grandmother   . Diabetes Father   . Hearing loss Neg Hx     Prior to Admission medications   Medication Sig Start Date End Date Taking? Authorizing Provider  amLODipine (NORVASC) 10 MG tablet Take 1 tablet (10 mg total) by mouth daily. 03/31/12   Clayton Bibles, PA-C  cloNIDine (CATAPRES) 0.3 MG tablet Take 0.3 mg by mouth 2 (two) times daily.    [provider]  furosemide (LASIX) 20 MG tablet Take 1 tablet (20 mg total) by mouth daily for 3 days. 12/16/17 12/19/17  Zigmund Gottron, NP  hydroxychloroquine (PLAQUENIL) 200 MG tablet Take 200 mg by mouth 2 (two) times daily.    [provider]  ibuprofen (ADVIL,MOTRIN) 600 MG tablet TAKE 1 TABLET (600 MG TOTAL) BY MOUTH EVERY 6 (SIX) HOURS AS NEEDED. 05/29/15   Anyanwu, Sallyanne Havers, MD  metFORMIN (GLUCOPHAGE) 500 MG tablet Take 1 tablet (500 mg total) by mouth 2 (two) times daily with a meal. 12/25/13   Phineas Douglas, Carlos American, PA-C  naproxen (NAPROSYN) 500 MG tablet 1 tablet every 12 hours prn pain 02/24/17   Bjorn Pippin, [place]    Physical Exam: Vitals:   12/24/17 1820 12/24/17 2005 12/24/17 2127 12/24/17 2300  BP: (!) 171/101 (!) 162/111 (!) 139/92 (!) 157/92  Pulse: 96  61 88 88  Resp: 20 19 18 17   Temp: 98.3 F (36.8 C) 98.2 F (36.8 C) 98.6 F (37 C)   TempSrc:   Oral   SpO2: 98% 97% 99% 100%      Constitutional: NAD, calm, comfortable Vitals:   12/24/17 1820 12/24/17 2005 12/24/17 2127 12/24/17 2300  BP: (!) 171/101 (!) 162/111 (!) 139/92 (!) 157/92  Pulse: 96 61 88 88  Resp: 20 19 18 17   Temp: 98.3 F (36.8 C) 98.2 F (36.8 C) 98.6 F (37 C)   TempSrc:   Oral   SpO2: 98% 97% 99% 100%   Eyes: PERRL, lids and conjunctivae normal ENMT: Mucous membranes are moist. Posterior pharynx clear of any exudate or lesions.Normal dentition.  Neck: normal, supple, no masses, no thyromegaly Respiratory: clear to auscultation bilaterally, no  wheezing, no crackles. Normal respiratory effort. No accessory muscle use.  Cardiovascular: Regular rate and rhythm, no murmurs / rubs / gallops. No extremity edema. 2+ pedal pulses. No carotid bruits.  Abdomen: no tenderness, no masses palpated. No hepatosplenomegaly. Bowel sounds positive.  Musculoskeletal: no clubbing / cyanosis. No joint deformity upper and lower extremities. Good ROM, no contractures. Normal muscle tone.  Skin: no rashes, lesions, ulcers. No induration Neurologic: CN 2-12 grossly intact. Sensation intact, DTR normal. Strength 5/5 in all 4.  Psychiatric: Normal judgment and insight. Alert and oriented x 3. Normal mood.   Labs on Admission: I have personally reviewed following labs and imaging studies  CBC: Recent Labs  Lab 12/24/17 1840  WBC 5.0  HGB 10.7*  HCT 32.9*  MCV 76.5*  PLT 376   Basic Metabolic Panel: Recent Labs  Lab 12/24/17 1840  NA 139  K 3.9  CL 109  CO2 21*  GLUCOSE 121*  BUN 7  CREATININE 0.65  CALCIUM 9.2   GFR: CrCl cannot be calculated (Unknown ideal weight.). Liver Function Tests: No results for input(s): AST, ALT, ALKPHOS, BILITOT, PROT, ALBUMIN in the last 168 hours. No results for input(s): LIPASE, AMYLASE in the last 168 hours. No results for input(s): AMMONIA in the last 168 hours. Coagulation Profile: No results for input(s): INR, PROTIME in the last 168 hours. Cardiac Enzymes: No results for input(s): CKTOTAL, CKMB, CKMBINDEX, TROPONINI in the last 168 hours. BNP (last 3 results) No results for input(s): PROBNP in the last 8760 hours. HbA1C: No results for input(s): HGBA1C in the last 72 hours. CBG: No results for input(s): GLUCAP in the last 168 hours. Lipid Profile: No results for input(s): CHOL, HDL, LDLCALC, TRIG, CHOLHDL, LDLDIRECT in the last 72 hours. Thyroid Function Tests: No results for input(s): TSH, T4TOTAL, FREET4, T3FREE, THYROIDAB in the last 72 hours. Anemia Panel: No results for input(s):  VITAMINB12, FOLATE, FERRITIN, TIBC, IRON, RETICCTPCT in the last 72 hours. Urine analysis:    Component Value Date/Time   COLORURINE YELLOW 01/02/2017 [place] 01/02/2017 1433   LABSPEC 1.020 07/19/2017 1317   PHURINE 7.0 07/19/2017 1317   GLUCOSEU 100 (A) 07/19/2017 1317   HGBUR MODERATE (A) 07/19/2017 1317   BILIRUBINUR NEGATIVE 07/19/2017 1317   KETONESUR NEGATIVE 07/19/2017 1317   PROTEINUR 30 (A) 07/19/2017 1317   UROBILINOGEN 1.0 07/19/2017 1317   NITRITE NEGATIVE 07/19/2017 1317   LEUKOCYTESUR SMALL (A) 07/19/2017 1317   Sepsis Labs: @LABRCNTIP (procalcitonin:4,lacticidven:4) )No results found for this or any previous visit (from the past 240 hour(s)).   Radiological Exams on Admission: Dg Chest 2 View  Result Date: 12/24/2017 CLINICAL DATA:  Shortness of breath  and bilateral lower extremity swelling. EXAM: CHEST - 2 VIEW COMPARISON:  PA and lateral chest 12/16/2017 and 11/12/2017. FINDINGS: Marked enlargement of the cardiopericardial silhouette is unchanged. There is mild basilar atelectasis. Mild vascular congestion without edema is noted. Aortic atherosclerosis is seen. No bony abnormality. IMPRESSION: No acute disease. Marked enlargement of the cardiopericardial silhouette consistent with cardiomegaly and/or pericardial effusion is chronic and unchanged. Pulmonary vascular congestion without edema. Electronically Signed   By: Inge Rise M.D.   On: 12/24/2017 18:51    EKG: Independently reviewed. It shows sinus tachycardia with a rate of 101, low voltage EKG with rightward axispeak ST change  Assessment/Plan Principal Problem:   Exertional dyspnea Active Problems:   Lupus (HCC)   Lower extremity edema   Hypertension   Anemia   Diabetes (HCC)    #1 exertional dyspnea: Suspected new onset congestive heart failure. Probably diastolic in nature. May be due to lupus or long-standing hypertension. Patient will be admitted to telemetry floor. IV Lasix  with beta blockers and ace inhibitors. Get echocardiogram to evaluate EF. Depending on the results may get cardiology consult.  #2 history of lupus: Continue home regimen.  #3 hypertension: Has had multiple episode of hypertensive urgency. Goal is to control blood pressure tightly.  #4 Normocytic anemia: Probably due to Lupus. Continue to monitor H&H.  #5 diabetes: We will hold metformin and start sliding scale insulin.   DVT prophylaxis: Lovenox  Code Status: full code  Family Communication: Husband at bedside Disposition Plan: home  Consults called: none  Admission status: inpatient   Severity of Illness: The appropriate patient status for this patient is INPATIENT. Inpatient status is judged to be reasonable and necessary in order to provide the required intensity of service to ensure the patient's safety. The patient's presenting symptoms, physical exam findings, and initial radiographic and laboratory data in the context of their chronic comorbidities is felt to place them at high risk for further clinical deterioration. Furthermore, it is not anticipated that the patient will be medically stable for discharge from the hospital within 2 midnights of admission. The following factors support the patient status of inpatient.   " The patient's presenting symptoms include exertional dyspnea. " The worrisome physical exam findings include by basilar crackles. " The initial radiographic and laboratory data are worrisome because of x-ray showing pulmonary congestion. " The chronic co-morbidities include long-standing history of hypo-to.   * I certify that at the point of admission it is my clinical judgment that the patient will require inpatient hospital care spanning beyond 2 midnights from the point of admission due to high intensity of service, high risk for further deterioration and high frequency of surveillance required.Barbette Merino MD [facility] Pager [phone]    If 7PM-7AM, please contact night-coverage [email or web address] Password [facility]  12/25/2017, 12:22 AM

## 2017-12-25 NOTE — Progress Notes (Signed)
CRITICAL VALUE ALERT  Critical Value:  Troponin 0.06  Date & Time Notied:  12/25/17  Provider Notified: Fransisca Connors  Orders Received/Actions taken: No new orders at this time

## 2017-12-25 NOTE — Progress Notes (Signed)
Nutrition Brief Note  Patient identified on the Malnutrition Screening Tool (MST) Report  Wt Readings from Last 15 Encounters:  12/25/17 220 lb (99.8 kg)  08/14/17 232 lb (105.2 kg)  06/05/17 227 lb 4.8 oz (103.1 kg)  05/22/17 221 lb (100.2 kg)  03/06/17 222 lb (100.7 kg)  01/02/17 215 lb (97.5 kg)  10/03/16 230 lb 11.2 oz (104.6 kg)  09/26/16 232 lb (105.2 kg)  07/18/16 244 lb 1.6 oz (110.7 kg)  02/01/16 237 lb (107.5 kg)  10/07/15 230 lb 9.6 oz (104.6 kg)  06/08/15 233 lb 4.8 oz (105.8 kg)  03/23/15 233 lb (105.7 kg)  03/04/15 226 lb 11.2 oz (102.8 kg)  02/06/15 230 lb (104.3 kg)   [name] is a 42 y.o. female with medical history significant of loop loose as well as extensive hypertension with depression and chronic anemia presenting with progressive shortness of breath with exertion associated with generalized weakness as well as cough.   Pt admitted with exertional dyspnea (possible onset CHF).   Spoke with pt, who was sleepy upon interview, but answered questions appropriately. She reports she always has a good appetite. She has been losing weight intentionally due to lifestyle changes ("I'm trying to be better about my DM; I used to not be"). She reports UBW of around 220#; she estimates she has lost 7# secondary to diuretic use.   Last Hgb A1c: 6.9 (12/25/17). PTA DM medications 1000 mg metformin BID (pt is compliant with medications and with no difficulty obtaining medications/meter/test strips, but reports mild GI distress with metformin, which is a typical side effect). Pt reports checking CBGS at home; readings typically run below 129. She has been more vigilant with her diabetes self-management by making more healthful food choices and taking her medications. She consumes approximately 3 meals per day (consists of chicken and vegetables; pt drinks water, coke zero, and other low calorie beverages). Reinforced importance of a general, healthful diet to optimize control  of heart disease and DM. Pt denies any further questions, but expresses appreciation for visit.   Nutrition-Focused physical exam completed. Findings are no fat depletion, no muscle depletion, and mild edema.   Labs reviewed: CBGS: 113 (inpatient orders for 0-9 units insulin aspart TID), K: 3.4, Mg: 1.2.   Body mass index is 42.97 kg/m. Patient meets criteria for extreme obesity, class III based on current BMI.   Current diet order is Heart Healthy/ Carb Modified, patient is consuming approximately 100% of meals at this time. Labs and medications reviewed.   No nutrition interventions warranted at this time. If nutrition issues arise, please consult RD.   Ajmal Kathan A. Jimmye Norman, RD, LDN, CDE Pager: [phone] After hours Pager: [phone]

## 2017-12-26 DIAGNOSIS — R0609 Other forms of dyspnea: Secondary | ICD-10-CM

## 2017-12-26 LAB — GLUCOSE, CAPILLARY
Glucose-Capillary: 146 mg/dL — ABNORMAL HIGH (ref 65–99)
Glucose-Capillary: 153 mg/dL — ABNORMAL HIGH (ref 65–99)
Glucose-Capillary: 172 mg/dL — ABNORMAL HIGH (ref 65–99)
Glucose-Capillary: 126 mg/dL — ABNORMAL HIGH (ref 65–99)

## 2017-12-26 LAB — BASIC METABOLIC PANEL
Anion gap: 10 (ref 5–15)
BUN: 8 mg/dL (ref 6–20)
CO2: 21 mmol/L — ABNORMAL LOW (ref 22–32)
Calcium: 9.3 mg/dL (ref 8.9–10.3)
Chloride: 105 mmol/L (ref 101–111)
Creatinine, Ser: 0.58 mg/dL (ref 0.44–1.00)
GFR calc Af Amer: >60 mL/min (ref >60)
GFR calc non Af Amer: >60 mL/min (ref >60)
Glucose, Bld: 161 mg/dL — ABNORMAL HIGH (ref 65–99)
Potassium: 4 mmol/L (ref 3.5–5.1)
Sodium: 136 mmol/L (ref 135–145)

## 2017-12-26 LAB — C3 COMPLEMENT: C3 Complement: 144 mg/dL (ref 82–167)

## 2017-12-26 LAB — ANTI-DNA ANTIBODY, DOUBLE-STRANDED: ds DNA Ab: 2 IU/mL (ref 0–9)

## 2017-12-26 LAB — CBC
HCT: 36.4 % (ref 36.0–46.0)
Hemoglobin: 12 g/dL (ref 12.0–15.0)
MCH: 24.6 pg — ABNORMAL LOW (ref 26.0–34.0)
MCHC: 33 g/dL (ref 30.0–36.0)
MCV: 74.7 fL — ABNORMAL LOW (ref 78.0–100.0)
Platelets: 387 10*3/uL (ref 150–400)
RBC: 4.87 MIL/uL (ref 3.87–5.11)
RDW: 14.7 % (ref 11.5–15.5)
WBC: 2.8 10*3/uL — ABNORMAL LOW (ref 4.0–10.5)

## 2017-12-26 LAB — C4 COMPLEMENT: Complement C4, Body Fluid: 22 mg/dL (ref 14–44)

## 2017-12-26 MED ORDER — HYDRALAZINE HCL 20 MG/ML IJ SOLN
10.0000 mg | Freq: Once | INTRAMUSCULAR | Status: AC
  Administered 2017-12-26: 10 mg via INTRAVENOUS
  Filled 2017-12-26: qty 1

## 2017-12-26 NOTE — Progress Notes (Signed)
PROGRESS NOTE    [name]  XBM:[id number] DOB: 1976/05/18 DOA: 12/24/2017 PCP: [name], MD   Brief Narrative 42 y.o. F with Lupus on hydroxychloroquine, HTN, and dCHF who presents with slowly progressive dyspnea on exertion and cough for a week or two.  One week ago, seen in Urgent Care, CXR showed enlarged cardiac silhouette, edema, started on Lasix.  Did not start it, but started to notice progressive leg swelling, finally came to the ER.   Principal Problem:   Exertional dyspnea Active Problems:   Lupus (HCC)   Lower extremity edema   Hypertension   Anemia   Diabetes (HCC)   Acute pericarditis Elevated troponin Moderate effusion on echo without tamponade features.   -Start Solu-Medrol -Continue Plaquenil -Check complements, anti-double-stranded DNA titer   Chronic diastolic CHF Echo with normal EF.  Grade 2 DD.  BNP >200. -Hold Lasix -Strict I/Os, daily weights, telemetry  Hypertension -Continue amlodipine, clonidine  Diabetes Hemoglobin A1c 6.9%, off metformin. -Sliding scale corrections  Anemia From chronic disease  Hypokalemia -Supplement K  Likely Lupus flare Pericarditis, as well as new skin rashes some small hypopigmented spots, appearing in the last few weeks) as well as flaring of her hand joint pains symmetrically. -Solu-Medrol IV -Continue Plaquenil        DVT prophylaxis: Lovenox Code Status: Full code Family Communication: Husband in the room while I was talking to patient Disposition Plan: TBD Consultants:  None  Procedures: None  Antimicrobials:  Subjective:  Objective: Vitals:   12/25/17 0351 12/25/17 1207 12/25/17 2217 12/26/17 0638  BP: (!) 152/89 99/77 (!) 144/92 (!) 162/104  Pulse: 83 82 81 94  Resp: 12 20 18 18   Temp: 98.6 F (37 C) 98.4 F (36.9 C) 98.2 F (36.8 C) 98.1 F (36.7 C)  TempSrc: Oral Oral Oral Oral  SpO2: 98% 100% 100% 100%  Weight:    99.9 kg (220 lb 3.2 oz)  Height:         Intake/Output Summary (Last 24 hours) at 12/26/2017 0855 Last data filed at 12/25/2017 2132 Gross per 24 hour  Intake 1200 ml  Output 800 ml  Net 400 ml   Filed Weights   12/25/17 0127 12/26/17 0638  Weight: 99.8 kg (220 lb) 99.9 kg (220 lb 3.2 oz)    Examination:  General exam: Appears calm and comfortable  Respiratory system: Clear to auscultation. Respiratory effort normal. Cardiovascular system: S1 & S2 heard, RRR. No JVD, murmurs, rubs, gallops or clicks. No pedal edema. Gastrointestinal system: Abdomen is nondistended, soft and nontender. No organomegaly or masses felt. Normal bowel sounds heard. Central nervous system: Alert and oriented. No focal neurological deficits. Extremities:trace edema Skin: No rashes, lesions or ulcers Psychiatry: Judgement and insight appear normal. Mood & affect appropriate.     Data Reviewed: I have personally reviewed following labs and imaging studies  CBC: Recent Labs  Lab 12/24/17 1840 12/25/17 0206 12/25/17 0553 12/26/17 0713  WBC 5.0 5.2 5.0 2.8*  NEUTROABS  --   --  2.9  --   HGB 10.7* 11.4* 10.8* 12.0  HCT 32.9* 35.1* 32.9* 36.4  MCV 76.5* 76.8* 75.8* 74.7*  PLT 333 344 328 027   Basic Metabolic Panel: Recent Labs  Lab 12/24/17 1840 12/25/17 0206 12/25/17 0553 12/26/17 0713  NA 139  --  137 136  K 3.9  --  3.4* 4.0  CL 109  --  106 105  CO2 21*  --  20* 21*  GLUCOSE 121*  --  131* 161*  BUN 7  --  7 8  CREATININE 0.65 0.91 0.81 0.58  CALCIUM 9.2  --  9.0 9.3  MG  --  1.2*  --   --    GFR: Estimated Creatinine Clearance: 97.3 mL/min (by C-G formula based on SCr of 0.58 mg/dL). Liver Function Tests: Recent Labs  Lab 12/25/17 0553  AST 16  ALT 11*  ALKPHOS 51  BILITOT 0.7  PROT 7.6  ALBUMIN 3.3*   No results for input(s): LIPASE, AMYLASE in the last 168 hours. No results for input(s): AMMONIA in the last 168 hours. Coagulation Profile: No results for input(s): INR, PROTIME in the last 168  hours. Cardiac Enzymes: Recent Labs  Lab 12/25/17 0206 12/25/17 0553 12/25/17 1208  TROPONINI 0.06* 0.05* 0.04*   BNP (last 3 results) No results for input(s): PROBNP in the last 8760 hours. HbA1C: Recent Labs    12/25/17 0206  HGBA1C 6.9*   CBG: Recent Labs  Lab 12/25/17 0743 12/25/17 1143 12/25/17 1643 12/25/17 2214 12/26/17 0739  GLUCAP 113* 113* 92 236* 146*   Lipid Profile: No results for input(s): CHOL, HDL, LDLCALC, TRIG, CHOLHDL, LDLDIRECT in the last 72 hours. Thyroid Function Tests: Recent Labs    12/25/17 0206  TSH 5.026*   Anemia Panel: No results for input(s): VITAMINB12, FOLATE, FERRITIN, TIBC, IRON, RETICCTPCT in the last 72 hours. Sepsis Labs: No results for input(s): PROCALCITON, LATICACIDVEN in the last 168 hours.  No results found for this or any previous visit (from the past 240 hour(s)).       Radiology Studies: Dg Chest 2 View  Result Date: 12/24/2017 CLINICAL DATA:  Shortness of breath and bilateral lower extremity swelling. EXAM: CHEST - 2 VIEW COMPARISON:  PA and lateral chest 12/16/2017 and 11/12/2017. FINDINGS: Marked enlargement of the cardiopericardial silhouette is unchanged. There is mild basilar atelectasis. Mild vascular congestion without edema is noted. Aortic atherosclerosis is seen. No bony abnormality. IMPRESSION: No acute disease. Marked enlargement of the cardiopericardial silhouette consistent with cardiomegaly and/or pericardial effusion is chronic and unchanged. Pulmonary vascular congestion without edema. Electronically Signed   By: Inge Rise M.D.   On: 12/24/2017 18:51        Scheduled Meds: . amLODipine  10 mg Oral Daily  . cloNIDine  0.3 mg Oral BID  . enoxaparin (LOVENOX) injection  40 mg Subcutaneous Daily  . hydroxychloroquine  200 mg Oral BID  . insulin aspart  0-9 Units Subcutaneous TID WC  . methylPREDNISolone (SOLU-MEDROL) injection  60 mg Intravenous Daily   Continuous Infusions:   LOS: 1  day     Georgette Shell, MD [facility]  If 7PM-7AM, please contact night-coverage [email or web address] Password [facility] 12/26/2017, 8:55 AM

## 2017-12-26 NOTE — Progress Notes (Signed)
Blood pressure 212/114. Patient asymptomatic. Gave scheduled clonidine, no PRN to give. Paged NP on call for additional medication.  Ginny Loomer, RN

## 2017-12-27 LAB — GLUCOSE, CAPILLARY
Glucose-Capillary: 144 mg/dL — ABNORMAL HIGH (ref 65–99)
Glucose-Capillary: 207 mg/dL — ABNORMAL HIGH (ref 65–99)
Glucose-Capillary: 220 mg/dL — ABNORMAL HIGH (ref 65–99)
Glucose-Capillary: 182 mg/dL — ABNORMAL HIGH (ref 65–99)

## 2017-12-27 LAB — CBC WITH DIFFERENTIAL/PLATELET
Abs Immature Granulocytes: 0 10*3/uL (ref 0.0–0.1)
Basophils Absolute: 0 10*3/uL (ref 0.0–0.1)
Basophils Relative: 0 %
Eosinophils Absolute: 0.1 10*3/uL (ref 0.0–0.7)
Eosinophils Relative: 1 %
HCT: 33.9 % — ABNORMAL LOW (ref 36.0–46.0)
Hemoglobin: 11 g/dL — ABNORMAL LOW (ref 12.0–15.0)
Immature Granulocytes: 0 %
Lymphocytes Relative: 27 %
Lymphs Abs: 1.7 10*3/uL (ref 0.7–4.0)
MCH: 24.4 pg — ABNORMAL LOW (ref 26.0–34.0)
MCHC: 32.4 g/dL (ref 30.0–36.0)
MCV: 75.3 fL — ABNORMAL LOW (ref 78.0–100.0)
Monocytes Absolute: 0.7 10*3/uL (ref 0.1–1.0)
Monocytes Relative: 12 %
Neutro Abs: 3.8 10*3/uL (ref 1.7–7.7)
Neutrophils Relative %: 60 %
Platelets: 352 10*3/uL (ref 150–400)
RBC: 4.5 MIL/uL (ref 3.87–5.11)
RDW: 14.8 % (ref 11.5–15.5)
WBC: 6.3 10*3/uL (ref 4.0–10.5)

## 2017-12-27 LAB — BASIC METABOLIC PANEL
Anion gap: 11 (ref 5–15)
BUN: 12 mg/dL (ref 6–20)
CO2: 21 mmol/L — ABNORMAL LOW (ref 22–32)
Calcium: 9.3 mg/dL (ref 8.9–10.3)
Chloride: 107 mmol/L (ref 101–111)
Creatinine, Ser: 0.67 mg/dL (ref 0.44–1.00)
GFR calc Af Amer: >60 mL/min (ref >60)
GFR calc non Af Amer: >60 mL/min (ref >60)
Glucose, Bld: 159 mg/dL — ABNORMAL HIGH (ref 65–99)
Potassium: 3.5 mmol/L (ref 3.5–5.1)
Sodium: 139 mmol/L (ref 135–145)

## 2017-12-27 MED ORDER — HYDRALAZINE HCL 50 MG PO TABS
50.0000 mg | ORAL_TABLET | Freq: Four times a day (QID) | ORAL | Status: DC
  Administered 2017-12-27 – 2017-12-28 (×5): 50 mg via ORAL
  Filled 2017-12-27 (×5): qty 1

## 2017-12-27 MED ORDER — METOPROLOL TARTRATE 5 MG/5ML IV SOLN
5.0000 mg | Freq: Once | INTRAVENOUS | Status: AC
  Administered 2017-12-27: 5 mg via INTRAVENOUS
  Filled 2017-12-27: qty 5

## 2017-12-27 MED ORDER — HYDRALAZINE HCL 20 MG/ML IJ SOLN
5.0000 mg | Freq: Four times a day (QID) | INTRAMUSCULAR | Status: DC | PRN
  Administered 2017-12-27: 5 mg via INTRAVENOUS
  Filled 2017-12-27: qty 1

## 2017-12-27 MED ORDER — GUAIFENESIN-DM 100-10 MG/5ML PO SYRP
5.0000 mL | ORAL_SOLUTION | ORAL | Status: DC | PRN
  Administered 2017-12-27: 5 mL via ORAL
  Filled 2017-12-27: qty 5

## 2017-12-27 NOTE — Progress Notes (Signed)
PROGRESS NOTE    [name]  WIO:[id number] DOB: 18-Aug-1975 DOA: 12/24/2017 PCP: [name], MD   Brief Narrative: 42 y.o.Fwith Lupus on hydroxychloroquine, HTN, and dCHF who presents with slowly progressive dyspnea on exertion and cough for a week or two. One week ago, seen in Urgent Care, CXR showed enlarged cardiac silhouette, edema, started on Lasix. Did not start it, but started to notice progressive leg swelling, finally came to the ER.     Assessment & Plan:   Principal Problem:   Exertional dyspnea Active Problems:   Lupus (HCC)   Lower extremity edema   Hypertension   Anemia   Diabetes (HCC)  Acute pericarditis Elevated troponin Moderate effusion on echo without tamponade features. -StartSolu-Medrol -Continue Plaquenil -Check complements, anti-double-stranded DNA titer   Chronic diastolic CHF Echo with normal EF. Grade 2 DD. BNP >200. -HoldLasix -Strict I/Os, daily weights, telemetry Uncontrolled hypertension -Continue amlodipine, clonidine -Start hydralazine 50 mg 3 times a day. Diabetes Hemoglobin A1c 6.9%, off metformin. -Sliding scale corrections  Anemia From chronic disease  Hypokalemia -Supplement K  Likely Lupus flare Pericarditis, as well as new skin rashes some small hypopigmented spots, appearing in the last few weeks) as well as flaring of her hand joint pains symmetrically. -Solu-Medrol IV -ContinuePlaquenil      DVT prophylaxis l;ovenox Code Status: Full code Family Communication: Husband in the room Disposition Plan:  plan discharge in 24 hours once her blood pressure is better controlled.   Consultants: None  Procedures: None Antimicrobials none  Subjective: Denies any specific complaints no chest pain shortness of breath nausea vomiting diarrhea.   Objective: Vitals:   12/27/17 0527 12/27/17 0849 12/27/17 0907 12/27/17 1152  BP: (!) 144/93 (!) 160/93 (!) 145/94 (!) 152/96  Pulse: 98 87 84  89  Resp: 18 14 20 20   Temp: 98.3 F (36.8 C)  98 F (36.7 C) 98.9 F (37.2 C)  TempSrc: Oral  Oral Oral  SpO2: 95%  99% 99%  Weight: 99.7 kg (219 lb 11.2 oz)     Height:        Intake/Output Summary (Last 24 hours) at 12/27/2017 1155 Last data filed at 12/27/2017 4268 Gross per 24 hour  Intake 360 ml  Output 1500 ml  Net -1140 ml   Filed Weights   12/25/17 0127 12/26/17 0638 12/27/17 0527  Weight: 99.8 kg (220 lb) 99.9 kg (220 lb 3.2 oz) 99.7 kg (219 lb 11.2 oz)    Examination:  General exam: Appears calm and comfortable  Respiratory system: Clear to auscultation. Respiratory effort normal. Cardiovascular system: S1 & S2 heard, RRR. No JVD, murmurs, rubs, gallops or clicks. No pedal edema. Gastrointestinal system: Abdomen is nondistended, soft and nontender. No organomegaly or masses felt. Normal bowel sounds heard. Central nervous system: Alert and oriented. No focal neurological deficits. Extremities: Symmetric 5 x 5 power. Skin: No rashes, lesions or ulcers Psychiatry: Judgement and insight appear normal. Mood & affect appropriate.     Data Reviewed: I have personally reviewed following labs and imaging studies  CBC: Recent Labs  Lab 12/24/17 1840 12/25/17 0206 12/25/17 0553 12/26/17 0713 12/27/17 0612  WBC 5.0 5.2 5.0 2.8* 6.3  NEUTROABS  --   --  2.9  --  3.8  HGB 10.7* 11.4* 10.8* 12.0 11.0*  HCT 32.9* 35.1* 32.9* 36.4 33.9*  MCV 76.5* 76.8* 75.8* 74.7* 75.3*  PLT 333 344 328 387 341   Basic Metabolic Panel: Recent Labs  Lab 12/24/17 1840 12/25/17 0206 12/25/17 0553 12/26/17  1610 12/27/17 0612  NA 139  --  137 136 139  K 3.9  --  3.4* 4.0 3.5  CL 109  --  106 105 107  CO2 21*  --  20* 21* 21*  GLUCOSE 121*  --  131* 161* 159*  BUN 7  --  7 8 12   CREATININE 0.65 0.91 0.81 0.58 0.67  CALCIUM 9.2  --  9.0 9.3 9.3  MG  --  1.2*  --   --   --    GFR: Estimated Creatinine Clearance: 97.2 mL/min (by C-G formula based on SCr of 0.67 mg/dL). Liver  Function Tests: Recent Labs  Lab 12/25/17 0553  AST 16  ALT 11*  ALKPHOS 51  BILITOT 0.7  PROT 7.6  ALBUMIN 3.3*   No results for input(s): LIPASE, AMYLASE in the last 168 hours. No results for input(s): AMMONIA in the last 168 hours. Coagulation Profile: No results for input(s): INR, PROTIME in the last 168 hours. Cardiac Enzymes: Recent Labs  Lab 12/25/17 0206 12/25/17 0553 12/25/17 1208  TROPONINI 0.06* 0.05* 0.04*   BNP (last 3 results) No results for input(s): PROBNP in the last 8760 hours. HbA1C: Recent Labs    12/25/17 0206  HGBA1C 6.9*   CBG: Recent Labs  Lab 12/26/17 0739 12/26/17 1133 12/26/17 1640 12/26/17 2150 12/27/17 0814  GLUCAP 146* 153* 172* 126* 144*   Lipid Profile: No results for input(s): CHOL, HDL, LDLCALC, TRIG, CHOLHDL, LDLDIRECT in the last 72 hours. Thyroid Function Tests: Recent Labs    12/25/17 0206  TSH 5.026*   Anemia Panel: No results for input(s): VITAMINB12, FOLATE, FERRITIN, TIBC, IRON, RETICCTPCT in the last 72 hours. Sepsis Labs: No results for input(s): PROCALCITON, LATICACIDVEN in the last 168 hours.  No results found for this or any previous visit (from the past 240 hour(s)).       Radiology Studies: No results found.      Scheduled Meds: . amLODipine  10 mg Oral Daily  . cloNIDine  0.3 mg Oral BID  . enoxaparin (LOVENOX) injection  40 mg Subcutaneous Daily  . hydrALAZINE  50 mg Oral Q6H  . hydroxychloroquine  200 mg Oral BID  . insulin aspart  0-9 Units Subcutaneous TID WC  . methylPREDNISolone (SOLU-MEDROL) injection  60 mg Intravenous Daily   Continuous Infusions:   LOS: 2 days     Georgette Shell, MD [facility] If 7PM-7AM, please contact night-coverage [email or web address] Password [email or web address] 12/27/2017, 11:55 AM

## 2017-12-27 NOTE — Progress Notes (Signed)
Metoprolol 5 mg IV ordered and given.  Jamespaul Secrist, RN

## 2017-12-27 NOTE — Progress Notes (Signed)
BP remains elevated despite given 5 mg of PRN IV Hydralazine.  BP 222/113. Gave scheduled BP medication and paged for additional medication.  Tesha Archambeau, RN

## 2017-12-28 LAB — BASIC METABOLIC PANEL
Anion gap: 10 (ref 5–15)
BUN: 13 mg/dL (ref 6–20)
CO2: 22 mmol/L (ref 22–32)
Calcium: 9.6 mg/dL (ref 8.9–10.3)
Chloride: 105 mmol/L (ref 101–111)
Creatinine, Ser: 0.64 mg/dL (ref 0.44–1.00)
GFR calc Af Amer: >60 mL/min (ref >60)
GFR calc non Af Amer: >60 mL/min (ref >60)
Glucose, Bld: 131 mg/dL — ABNORMAL HIGH (ref 65–99)
Potassium: 3.7 mmol/L (ref 3.5–5.1)
Sodium: 137 mmol/L (ref 135–145)

## 2017-12-28 LAB — CBC
HCT: 36.1 % (ref 36.0–46.0)
Hemoglobin: 11.9 g/dL — ABNORMAL LOW (ref 12.0–15.0)
MCH: 24.7 pg — ABNORMAL LOW (ref 26.0–34.0)
MCHC: 33 g/dL (ref 30.0–36.0)
MCV: 75.1 fL — ABNORMAL LOW (ref 78.0–100.0)
Platelets: 396 10*3/uL (ref 150–400)
RBC: 4.81 MIL/uL (ref 3.87–5.11)
RDW: 15 % (ref 11.5–15.5)
WBC: 8.7 10*3/uL (ref 4.0–10.5)

## 2017-12-28 LAB — GLUCOSE, CAPILLARY
Glucose-Capillary: 136 mg/dL — ABNORMAL HIGH (ref 65–99)
Glucose-Capillary: 205 mg/dL — ABNORMAL HIGH (ref 65–99)

## 2017-12-28 MED ORDER — HYDRALAZINE HCL 50 MG PO TABS: 100.0000 mg | ORAL_TABLET | Freq: Four times a day (QID) | ORAL | Status: DC

## 2017-12-28 MED ORDER — METOPROLOL TARTRATE 50 MG PO TABS
50.0000 mg | ORAL_TABLET | Freq: Two times a day (BID) | ORAL | Status: DC
  Administered 2017-12-28: 50 mg via ORAL
  Filled 2017-12-28: qty 1

## 2017-12-28 MED ORDER — HYDRALAZINE HCL 100 MG PO TABS: 100.0000 mg | ORAL_TABLET | Freq: Four times a day (QID) | ORAL | 0 refills | Status: DC

## 2017-12-28 MED ORDER — METOPROLOL TARTRATE 50 MG PO TABS: 50.0000 mg | ORAL_TABLET | Freq: Two times a day (BID) | ORAL | 1 refills | Status: DC

## 2017-12-28 MED ORDER — GUAIFENESIN-DM 100-10 MG/5ML PO SYRP: 5.0000 mL | ORAL_SOLUTION | ORAL | 0 refills | Status: DC | PRN

## 2017-12-28 MED ORDER — ACETAMINOPHEN 325 MG PO TABS: 650.0000 mg | ORAL_TABLET | ORAL | Status: DC | PRN

## 2017-12-28 MED ORDER — PREDNISONE 10 MG (21) PO TBPK: ORAL_TABLET | ORAL | 0 refills | Status: DC

## 2017-12-28 NOTE — Discharge Summary (Addendum)
Physician Discharge Summary  [name] KDT:[id number] DOB: May 05, 1976 DOA: 12/24/2017  PCP: [name], MD  Admit date: 12/24/2017 Discharge date: 12/28/2017  Admitted From: Home Disposition: Home  Recommendations for Outpatient Follow-up:  1. Follow up with PCP in 1-2 weeks 2. Please obtain BMP/CBC in one week 3. Follow-up with cardiologist dr Virgina Jock in 1 week.call his office Monday to get echo done.  Home Health none Equipment/Devices none Discharge Condition: Stable CODE STATUS: Full code Diet recommendation: Cardiac diet Brief/Interim Summary:Katherine PittmanFwith Lupus on hydroxychloroquine, HTN, and dCHF who presents with slowly progressive dyspnea on exertion and cough for a week or two. One week ago, seen in Urgent Care, CXR showed enlarged cardiac silhouette, edema, started on Lasix. Did not start it, but started to notice progressive leg swelling, finally came to the ER.      Discharge Diagnoses:  Principal Problem:   Exertional dyspnea Active Problems:   Lupus (HCC)   Lower extremity edema   Hypertension   Anemia   Diabetes (HCC)  1]chest pain- moderate pericardial effusion on echo mildly elevated troponin with 0.05 she was treated with Solu-Medrol.,  Plaquenil was continued.  Patient needs a follow-up echo next week.  Patient to follow-up with cardiology dr Virgina Jock  next week.  Discussed with patient.  Patient remained hemodynamically stable during her hospital stay.  She was able to ambulate without oxygen or without any support without any complaints.  2] hypertension patient takes clonidine and amlodipine at home which was continued.  Amlodipine dose was increased to 10 mg and Lopressor 50 mg twice a day was started due to ongoing high blood pressure.  3] diabetes continue metformin  4] chronic diastolic heart failure with normal ejection fraction.  Stable I do not see that she is on any diuretics at home.      Discharge  Instructions  Discharge Instructions    Call MD for:  difficulty breathing, headache or visual disturbances   Complete by:  As directed    Call MD for:  difficulty breathing, headache or visual disturbances   Complete by:  As directed    Call MD for:  persistant dizziness or light-headedness   Complete by:  As directed    Call MD for:  persistant dizziness or light-headedness   Complete by:  As directed    Call MD for:  persistant nausea and vomiting   Complete by:  As directed    Call MD for:  persistant nausea and vomiting   Complete by:  As directed    Call MD for:  severe uncontrolled pain   Complete by:  As directed    Call MD for:  severe uncontrolled pain   Complete by:  As directed    Call MD for:  temperature >100.4   Complete by:  As directed    Call MD for:  temperature >100.4   Complete by:  As directed    Diet - low sodium heart healthy   Complete by:  As directed    Diet - low sodium heart healthy   Complete by:  As directed    Increase activity slowly   Complete by:  As directed    Increase activity slowly   Complete by:  As directed      Allergies as of 12/28/2017      Reactions   Septra [bactrim] Hives      Medication List    STOP taking these medications   ibuprofen 600 MG tablet Commonly known as:  ADVIL,MOTRIN   naproxen 500 MG tablet Commonly known as:  NAPROSYN     TAKE these medications   acetaminophen 325 MG tablet Commonly known as:  TYLENOL Take 2 tablets (650 mg total) by mouth every 4 (four) hours as needed for headache or mild pain.   amLODipine 10 MG tablet Commonly known as:  NORVASC Take 1 tablet (10 mg total) by mouth daily.   cloNIDine 0.3 MG tablet Commonly known as:  CATAPRES Take 0.3 mg by mouth 2 (two) times daily.   guaiFENesin-dextromethorphan 100-10 MG/5ML syrup Commonly known as:  ROBITUSSIN DM Take 5 mLs by mouth every 4 (four) hours as needed for cough.   hydroxychloroquine 200 MG tablet Commonly known as:   PLAQUENIL Take 200 mg by mouth 2 (two) times daily.   medroxyPROGESTERone 150 MG/ML injection Commonly known as:  DEPO-PROVERA Inject 150 mg into the muscle every 3 (three) months.   metFORMIN 500 MG tablet Commonly known as:  GLUCOPHAGE Take 1 tablet (500 mg total) by mouth 2 (two) times daily with a meal. What changed:  how much to take   metoprolol tartrate 50 MG tablet Commonly known as:  LOPRESSOR Take 1 tablet (50 mg total) by mouth 2 (two) times daily.   predniSONE 10 MG (21) Tbpk tablet Commonly known as:  STERAPRED UNI-PAK 21 TAB Take 3 tablets for first three days then 2 tablets for next three days then 1 tablet daily till done.      Follow-up Information    [name], MD Follow up.   Specialty:  Internal Medicine Contact information: [address] [place] [address] [phone]        [name], MD Follow up.   Specialty:  Cardiology Why:  please call dr patwardhans office on monday morning to schedule an ECHO asap. Contact information: [address] [place] [address] [phone]          Allergies  Allergen Reactions  . Septra [Bactrim] Hives    Consultations: dw dr Virgina Jock on the phone Procedures/Studies: Dg Chest 2 View  Result Date: 12/24/2017 CLINICAL DATA:  Shortness of breath and bilateral lower extremity swelling. EXAM: CHEST - 2 VIEW COMPARISON:  PA and lateral chest 12/16/2017 and 11/12/2017. FINDINGS: Marked enlargement of the cardiopericardial silhouette is unchanged. There is mild basilar atelectasis. Mild vascular congestion without edema is noted. Aortic atherosclerosis is seen. No bony abnormality. IMPRESSION: No acute disease. Marked enlargement of the cardiopericardial silhouette consistent with cardiomegaly and/or pericardial effusion is chronic and unchanged. Pulmonary vascular congestion without edema. Electronically Signed   By: Inge Rise M.D.   On: 12/24/2017 18:51   Dg Chest 2  View  Result Date: 12/16/2017 CLINICAL DATA:  Cough and chest congestion for the past month. Nonsmoker. History of lupus. EXAM: CHEST - 2 VIEW COMPARISON:  PA and lateral chest x-ray of November 13, 2014 FINDINGS: The cardiopericardial silhouette remains enlarged. The pulmonary vascularity is prominent. The interstitial markings are increased. The hemidiaphragms are less well demonstrated today. The bony structures exhibit no acute abnormality. IMPRESSION: Chronic enlargement of the cardiac silhouette may reflect CHF and or a pericardial effusion. Mild central pulmonary vascular congestion and mild interstitial edema supports CHF. No alveolar pneumonia. An echocardiogram may be useful if this has not been performed in the past. Electronically Signed   By: David  Martinique M.D.   On: 12/16/2017 16:56   (Echo, Carotid, EGD, Colonoscopy, ERCP)    Subjective:   Discharge Exam: Vitals:  12/28/17 0438 12/28/17 0853  BP: (!) 171/93 (!) 160/95  Pulse: 87 97  Resp: 18 20  Temp: 98.2 F (36.8 C) 97.9 F (36.6 C)  SpO2: 98% 96%   Vitals:   12/27/17 2218 12/28/17 0041 12/28/17 0438 12/28/17 0853  BP: (!) 180/100 (!) 145/87 (!) 171/93 (!) 160/95  Pulse: 94  87 97  Resp:   18 20  Temp:   98.2 F (36.8 C) 97.9 F (36.6 C)  TempSrc:   Oral Oral  SpO2:   98% 96%  Weight:   99.1 kg (218 lb 8 oz)   Height:        General: Pt is alert, awake, not in acute distress Cardiovascular: RRR, S1/S2 +, no rubs, no gallops Respiratory: CTA bilaterally, no wheezing, no rhonchi Abdominal: Soft, NT, ND, bowel sounds + Extremities: no edema, no cyanosis    The results of significant diagnostics from this hospitalization (including imaging, microbiology, ancillary and laboratory) are listed below for reference.     Microbiology: No results found for this or any previous visit (from the past 240 hour(s)).   Labs: BNP (last 3 results) Recent Labs    12/24/17 1840  BNP 166.0*   Basic Metabolic  Panel: Recent Labs  Lab 12/24/17 1840 12/25/17 0206 12/25/17 0553 12/26/17 0713 12/27/17 0612 12/28/17 0633  NA 139  --  137 136 139 137  K 3.9  --  3.4* 4.0 3.5 3.7  CL 109  --  106 105 107 105  CO2 21*  --  20* 21* 21* 22  GLUCOSE 121*  --  131* 161* 159* 131*  BUN 7  --  7 8 12 13   CREATININE 0.65 0.91 0.81 0.58 0.67 0.64  CALCIUM 9.2  --  9.0 9.3 9.3 9.6  MG  --  1.2*  --   --   --   --    Liver Function Tests: Recent Labs  Lab 12/25/17 0553  AST 16  ALT 11*  ALKPHOS 51  BILITOT 0.7  PROT 7.6  ALBUMIN 3.3*   No results for input(s): LIPASE, AMYLASE in the last 168 hours. No results for input(s): AMMONIA in the last 168 hours. CBC: Recent Labs  Lab 12/25/17 0206 12/25/17 0553 12/26/17 0713 12/27/17 0612 12/28/17 0633  WBC 5.2 5.0 2.8* 6.3 8.7  NEUTROABS  --  2.9  --  3.8  --   HGB 11.4* 10.8* 12.0 11.0* 11.9*  HCT 35.1* 32.9* 36.4 33.9* 36.1  MCV 76.8* 75.8* 74.7* 75.3* 75.1*  PLT 344 328 387 352 396   Cardiac Enzymes: Recent Labs  Lab 12/25/17 0206 12/25/17 0553 12/25/17 1208  TROPONINI 0.06* 0.05* 0.04*   BNP: Invalid input(s): POCBNP CBG: Recent Labs  Lab 12/27/17 0814 12/27/17 1151 12/27/17 1637 12/27/17 2118 12/28/17 0729  GLUCAP 144* 207* 220* 182* 136*   D-Dimer No results for input(s): DDIMER in the last 72 hours. Hgb A1c No results for input(s): HGBA1C in the last 72 hours. Lipid Profile No results for input(s): CHOL, HDL, LDLCALC, TRIG, CHOLHDL, LDLDIRECT in the last 72 hours. Thyroid function studies No results for input(s): TSH, T4TOTAL, T3FREE, THYROIDAB in the last 72 hours.  Invalid input(s): FREET3 Anemia work up No results for input(s): VITAMINB12, FOLATE, FERRITIN, TIBC, IRON, RETICCTPCT in the last 72 hours. Urinalysis    Component Value Date/Time   COLORURINE YELLOW 01/02/2017 1433   APPEARANCEUR CLEAR 01/02/2017 1433   LABSPEC 1.020 07/19/2017 1317   PHURINE 7.0 07/19/2017 1317   GLUCOSEU 100 (  A) 07/19/2017  1317   HGBUR MODERATE (A) 07/19/2017 1317   BILIRUBINUR NEGATIVE 07/19/2017 1317   KETONESUR NEGATIVE 07/19/2017 1317   PROTEINUR 30 (A) 07/19/2017 1317   UROBILINOGEN 1.0 07/19/2017 1317   NITRITE NEGATIVE 07/19/2017 1317   LEUKOCYTESUR SMALL (A) 07/19/2017 1317   Sepsis Labs Invalid input(s): PROCALCITONIN,  WBC,  LACTICIDVEN Microbiology No results found for this or any previous visit (from the past 240 hour(s)).   Time coordinating discharge: 32 minutes  SIGNED:   Georgette Shell, MD  [facility] 12/28/2017, 11:04 AM Pager   If 7PM-7AM, please contact night-coverage [email or web address] Password [email or web address]

## 2018-01-16 ENCOUNTER — Encounter (HOSPITAL_COMMUNITY): Payer: Self-pay | Admitting: Emergency Medicine

## 2018-01-16 ENCOUNTER — Emergency Department (HOSPITAL_COMMUNITY): Payer: Self-pay

## 2018-01-16 ENCOUNTER — Inpatient Hospital Stay (HOSPITAL_COMMUNITY)
Admission: EM | Admit: 2018-01-16 | Discharge: 2018-01-18 | DRG: 545 | Disposition: A | Discharge status: Home / Self Care | Payer: Self-pay | Attending: Internal Medicine | Admitting: Internal Medicine

## 2018-01-16 ENCOUNTER — Other Ambulatory Visit: Payer: Self-pay

## 2018-01-16 DIAGNOSIS — I3139 Other pericardial effusion (noninflammatory): Secondary | ICD-10-CM | POA: Diagnosis present

## 2018-01-16 DIAGNOSIS — Z881 Allergy status to other antibiotic agents status: Secondary | ICD-10-CM

## 2018-01-16 DIAGNOSIS — Z833 Family history of diabetes mellitus: Secondary | ICD-10-CM

## 2018-01-16 DIAGNOSIS — R0781 Pleurodynia: Secondary | ICD-10-CM

## 2018-01-16 DIAGNOSIS — M3212 Pericarditis in systemic lupus erythematosus: Principal | ICD-10-CM

## 2018-01-16 DIAGNOSIS — Z79899 Other long term (current) drug therapy: Secondary | ICD-10-CM

## 2018-01-16 DIAGNOSIS — I10 Essential (primary) hypertension: Secondary | ICD-10-CM

## 2018-01-16 DIAGNOSIS — D509 Iron deficiency anemia, unspecified: Secondary | ICD-10-CM

## 2018-01-16 DIAGNOSIS — D72818 Other decreased white blood cell count: Secondary | ICD-10-CM

## 2018-01-16 DIAGNOSIS — Z7984 Long term (current) use of oral hypoglycemic drugs: Secondary | ICD-10-CM

## 2018-01-16 DIAGNOSIS — I5032 Chronic diastolic (congestive) heart failure: Secondary | ICD-10-CM

## 2018-01-16 DIAGNOSIS — Z6841 Body Mass Index (BMI) 40.0 and over, adult: Secondary | ICD-10-CM

## 2018-01-16 DIAGNOSIS — Z8249 Family history of ischemic heart disease and other diseases of the circulatory system: Secondary | ICD-10-CM

## 2018-01-16 DIAGNOSIS — I110 Hypertensive heart disease with heart failure: Secondary | ICD-10-CM | POA: Diagnosis present

## 2018-01-16 DIAGNOSIS — Z801 Family history of malignant neoplasm of trachea, bronchus and lung: Secondary | ICD-10-CM

## 2018-01-16 DIAGNOSIS — I5033 Acute on chronic diastolic (congestive) heart failure: Secondary | ICD-10-CM | POA: Diagnosis present

## 2018-01-16 DIAGNOSIS — M329 Systemic lupus erythematosus, unspecified: Secondary | ICD-10-CM | POA: Diagnosis present

## 2018-01-16 DIAGNOSIS — D72819 Decreased white blood cell count, unspecified: Secondary | ICD-10-CM | POA: Diagnosis present

## 2018-01-16 DIAGNOSIS — E1165 Type 2 diabetes mellitus with hyperglycemia: Secondary | ICD-10-CM | POA: Diagnosis present

## 2018-01-16 DIAGNOSIS — I313 Pericardial effusion (noninflammatory): Secondary | ICD-10-CM | POA: Diagnosis present

## 2018-01-16 HISTORY — DX: Heart failure, unspecified: I50.9

## 2018-01-16 LAB — BASIC METABOLIC PANEL
Anion gap: 8 (ref 5–15)
BUN: <5 mg/dL — ABNORMAL LOW (ref 6–20)
CO2: 23 mmol/L (ref 22–32)
Calcium: 9 mg/dL (ref 8.9–10.3)
Chloride: 108 mmol/L (ref 101–111)
Creatinine, Ser: 0.66 mg/dL (ref 0.44–1.00)
GFR calc Af Amer: >60 mL/min (ref >60)
GFR calc non Af Amer: >60 mL/min (ref >60)
Glucose, Bld: 142 mg/dL — ABNORMAL HIGH (ref 65–99)
Potassium: 3.7 mmol/L (ref 3.5–5.1)
Sodium: 139 mmol/L (ref 135–145)

## 2018-01-16 LAB — CBC
HCT: 35 % — ABNORMAL LOW (ref 36.0–46.0)
Hemoglobin: 11.4 g/dL — ABNORMAL LOW (ref 12.0–15.0)
MCH: 24.7 pg — ABNORMAL LOW (ref 26.0–34.0)
MCHC: 32.6 g/dL (ref 30.0–36.0)
MCV: 75.8 fL — ABNORMAL LOW (ref 78.0–100.0)
Platelets: 284 10*3/uL (ref 150–400)
RBC: 4.62 MIL/uL (ref 3.87–5.11)
RDW: 15.7 % — ABNORMAL HIGH (ref 11.5–15.5)
WBC: 3.8 10*3/uL — ABNORMAL LOW (ref 4.0–10.5)

## 2018-01-16 LAB — TROPONIN I: Troponin I: 0.05 ng/mL (ref <0.03)

## 2018-01-16 LAB — LIPASE, BLOOD: Lipase: 44 U/L (ref 11–51)

## 2018-01-16 LAB — I-STAT BETA HCG BLOOD, ED (MC, WL, AP ONLY): I-stat hCG, quantitative: <5 m[IU]/mL (ref <5)

## 2018-01-16 LAB — GLUCOSE, CAPILLARY: Glucose-Capillary: 132 mg/dL — ABNORMAL HIGH (ref 65–99)

## 2018-01-16 LAB — BRAIN NATRIURETIC PEPTIDE: B Natriuretic Peptide: 180.2 pg/mL — ABNORMAL HIGH (ref 0.0–100.0)

## 2018-01-16 LAB — C-REACTIVE PROTEIN: CRP: 0.9 mg/dL (ref <1.0)

## 2018-01-16 LAB — SEDIMENTATION RATE: Sed Rate: 47 mm/hr — ABNORMAL HIGH (ref 0–22)

## 2018-01-16 LAB — I-STAT TROPONIN, ED: Troponin i, poc: 0.07 ng/mL (ref 0.00–0.08)

## 2018-01-16 MED ORDER — FUROSEMIDE 10 MG/ML IJ SOLN
40.0000 mg | Freq: Two times a day (BID) | INTRAMUSCULAR | Status: DC
  Administered 2018-01-17 – 2018-01-18 (×3): 40 mg via INTRAVENOUS
  Filled 2018-01-16 (×3): qty 4

## 2018-01-16 MED ORDER — HYDROXYCHLOROQUINE SULFATE 200 MG PO TABS
200.0000 mg | ORAL_TABLET | Freq: Two times a day (BID) | ORAL | Status: DC
  Administered 2018-01-16 – 2018-01-18 (×4): 200 mg via ORAL
  Filled 2018-01-16 (×4): qty 1

## 2018-01-16 MED ORDER — INSULIN ASPART 100 UNIT/ML ~~LOC~~ SOLN
0.0000 [IU] | Freq: Every day | SUBCUTANEOUS | Status: DC
  Administered 2018-01-17: 2 [IU] via SUBCUTANEOUS

## 2018-01-16 MED ORDER — FUROSEMIDE 10 MG/ML IJ SOLN
40.0000 mg | Freq: Once | INTRAMUSCULAR | Status: AC
  Administered 2018-01-16: 40 mg via INTRAVENOUS
  Filled 2018-01-16: qty 4

## 2018-01-16 MED ORDER — SODIUM CHLORIDE 0.9 % IV SOLN
250.0000 mL | INTRAVENOUS | Status: DC | PRN
Start: 2018-01-16 — End: 2018-01-18

## 2018-01-16 MED ORDER — ENOXAPARIN SODIUM 40 MG/0.4ML ~~LOC~~ SOLN
40.0000 mg | SUBCUTANEOUS | Status: DC
  Administered 2018-01-16 – 2018-01-17 (×2): 40 mg via SUBCUTANEOUS
  Filled 2018-01-16 (×2): qty 0.4

## 2018-01-16 MED ORDER — ONDANSETRON HCL 4 MG/2ML IJ SOLN: 4.0000 mg | Freq: Four times a day (QID) | INTRAMUSCULAR | Status: DC | PRN

## 2018-01-16 MED ORDER — SODIUM CHLORIDE 0.9% FLUSH
3.0000 mL | INTRAVENOUS | Status: DC | PRN
  Administered 2018-01-17: 3 mL via INTRAVENOUS
  Filled 2018-01-16: qty 3

## 2018-01-16 MED ORDER — HYDROCODONE-ACETAMINOPHEN 5-325 MG PO TABS
1.0000 | ORAL_TABLET | ORAL | Status: DC | PRN
  Administered 2018-01-16 – 2018-01-17 (×2): 2 via ORAL
  Filled 2018-01-16 (×2): qty 2

## 2018-01-16 MED ORDER — AMLODIPINE BESYLATE 10 MG PO TABS
10.0000 mg | ORAL_TABLET | Freq: Every day | ORAL | Status: DC
  Administered 2018-01-17 – 2018-01-18 (×2): 10 mg via ORAL
  Filled 2018-01-16 (×2): qty 1

## 2018-01-16 MED ORDER — CLONIDINE HCL 0.2 MG PO TABS
0.3000 mg | ORAL_TABLET | Freq: Two times a day (BID) | ORAL | Status: DC
  Administered 2018-01-16 – 2018-01-18 (×4): 0.3 mg via ORAL
  Filled 2018-01-16 (×4): qty 1

## 2018-01-16 MED ORDER — METOPROLOL TARTRATE 50 MG PO TABS
50.0000 mg | ORAL_TABLET | Freq: Two times a day (BID) | ORAL | Status: DC
  Administered 2018-01-16 – 2018-01-18 (×4): 50 mg via ORAL
  Filled 2018-01-16 (×4): qty 1

## 2018-01-16 MED ORDER — ACETAMINOPHEN 325 MG PO TABS: 650.0000 mg | ORAL_TABLET | ORAL | Status: DC | PRN

## 2018-01-16 MED ORDER — METHYLPREDNISOLONE SODIUM SUCC 40 MG IJ SOLR
40.0000 mg | Freq: Two times a day (BID) | INTRAMUSCULAR | Status: DC
  Administered 2018-01-16 – 2018-01-17 (×3): 40 mg via INTRAVENOUS
  Filled 2018-01-16 (×3): qty 1

## 2018-01-16 MED ORDER — INSULIN ASPART 100 UNIT/ML ~~LOC~~ SOLN
0.0000 [IU] | Freq: Three times a day (TID) | SUBCUTANEOUS | Status: DC
  Administered 2018-01-17 (×2): 3 [IU] via SUBCUTANEOUS
  Administered 2018-01-18: 2 [IU] via SUBCUTANEOUS

## 2018-01-16 MED ORDER — METHYLPREDNISOLONE SODIUM SUCC 125 MG IJ SOLR: 60.0000 mg | Freq: Two times a day (BID) | INTRAMUSCULAR | Status: DC

## 2018-01-16 MED ORDER — SODIUM CHLORIDE 0.9% FLUSH
3.0000 mL | Freq: Two times a day (BID) | INTRAVENOUS | Status: DC
  Administered 2018-01-16 – 2018-01-18 (×4): 3 mL via INTRAVENOUS

## 2018-01-16 MED ORDER — TIZANIDINE HCL 4 MG PO TABS
4.0000 mg | ORAL_TABLET | Freq: Two times a day (BID) | ORAL | Status: DC
  Administered 2018-01-16 – 2018-01-18 (×4): 4 mg via ORAL
  Filled 2018-01-16 (×4): qty 1

## 2018-01-16 NOTE — ED Provider Notes (Signed)
[facility] EMERGENCY DEPARTMENT Provider Note   CSN: [id number] Arrival date & time: 01/16/18  1307   History   Chief Complaint Chief Complaint  Patient presents with  . Palpitations  . Abdominal Mass    HPI [name] is a 42 y.o. female.  The history is provided by the patient.   42 yo F with PMhx lupus (on plaquenil), HFpEF, HTN, DM who presents with 4 days of multiple complaints. States she has pain to upper bilateral thighs, BLE edema, and intermittent palpitations with chest tightness x 4 days, consistent with the symptoms she had last time when she was diagnosed with HF and hospitalized (approx 3 weeks ago). Symptoms moderate, worsening. States she recently finished prednisone taper. Leg pain worsened when moving legs, walking. Also complaints of new "knot" noted to R side of abd, which she states she has noticed in the past few days. Denies N/V/D, urinary symptoms. Denies dyspnea, fevers.   Past Medical History:  Diagnosis Date  . Abnormal Pap smear of cervix    colpo, HPV  . Allergy   . Anemia   . CHF (congestive heart failure) ([place])   . Depression    after losses  . Enlarged heart    managed by cardiology  . Fibroid   . Gestational diabetes   . Heart murmur   . Hypertension   . Infection    UTI  . Lupus ([place]) 1999    Patient Active Problem List   Diagnosis Date Noted  . Pericardial effusion 01/16/2018  . Pleuritic chest pain 01/16/2018  . Chronic diastolic CHF (congestive heart failure) ([place]) 01/16/2018  . 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 Leucopenia   . Exertional dyspnea 12/25/2017  . Lower extremity edema 12/25/2017  . Hypertension 12/25/2017  . Microcytic anemia 12/25/2017  . Diabetes ([place]) 12/25/2017  . Chronic pain of both knees 04/23/2017  . Fibroids 01/20/2015  . Menorrhagia 01/20/2015  . Low grade squamous intraepithelial lesion (LGSIL) on cervical Pap smear on 01/20/15 01/20/2015  . Atypical glandular cells on  cervical Pap smear on 01/20/15 01/20/2015  . Lupus ([place]) 07/15/2013    Past Surgical History:  Procedure Laterality Date  . THERAPEUTIC ABORTION       OB History    Gravida  5   Para  2   Term  0   Preterm  2   AB  3   Living  2     SAB  2   TAB  1   Ectopic  0   Multiple  0   Live Births  2            Home Medications    Prior to Admission medications   Medication Sig Start Date End Date Taking? Authorizing Provider  acetaminophen (TYLENOL) 325 MG tablet Take 2 tablets (650 mg total) by mouth every 4 (four) hours as needed for headache or mild pain. 12/28/17  Yes Georgette Shell, MD  amLODipine (NORVASC) 10 MG tablet Take 1 tablet (10 mg total) by mouth daily. 03/31/12  Yes West, Emily, PA-C  cloNIDine (CATAPRES) 0.3 MG tablet Take 0.3 mg by mouth 2 (two) times daily.   Yes [provider]  hydroxychloroquine (PLAQUENIL) 200 MG tablet Take 200 mg by mouth 2 (two) times daily.   Yes [provider]  medroxyPROGESTERone (DEPO-PROVERA) 150 MG/ML injection Inject 150 mg into the muscle every 3 (three) months.   Yes [provider]  metFORMIN (GLUCOPHAGE) 500 MG tablet  Take 1 tablet (500 mg total) by mouth 2 (two) times daily with a meal. Patient taking differently: Take 1,000 mg by mouth 2 (two) times daily with a meal.  12/25/13  Yes Phineas Douglas, Carlos American, PA-C  metoprolol tartrate (LOPRESSOR) 50 MG tablet Take 1 tablet (50 mg total) by mouth 2 (two) times daily. 12/28/17 01/27/18 Yes Georgette Shell, MD  tiZANidine (ZANAFLEX) 4 MG tablet Take 4 mg by mouth 2 (two) times daily. 11/29/17  Yes [provider]    Family History Family History  Problem Relation Age of Onset  . Cancer Maternal Grandfather        lung  . Diabetes Mother   . Diabetes Maternal Uncle   . Hypertension Maternal Grandmother   . Diabetes Father   . Hearing loss Neg Hx     Social History Social History   Tobacco Use  . Smoking status: Never  Smoker  . Smokeless tobacco: Never Used  Substance Use Topics  . Alcohol use: Yes    Comment: rarely  . Drug use: No     Allergies   Septra [bactrim]   Review of Systems Review of Systems  Constitutional: Negative for chills and fever.  HENT: Negative for ear pain and sore throat.   Eyes: Negative for pain and visual disturbance.  Respiratory: Positive for chest tightness. Negative for cough and shortness of breath.   Cardiovascular: Positive for leg swelling. Negative for chest pain and palpitations.  Gastrointestinal: Negative for abdominal pain and vomiting.       "knot to abd"  Genitourinary: Negative for dysuria and hematuria.  Musculoskeletal: Positive for myalgias. Negative for arthralgias and back pain.  Skin: Positive for rash. Negative for color change.  Neurological: Negative for seizures and syncope.  All other systems reviewed and are negative.    Physical Exam Updated Vital Signs BP (!) 159/100 (BP Location: Right Arm)   Pulse 94   Temp 99 F (37.2 C) (Oral)   Resp 19   Ht 5' (1.524 m)   Wt 98.4 kg (217 lb)   LMP 12/31/2017 (Approximate)   SpO2 100%   BMI 42.38 kg/m   Physical Exam  Constitutional: She is oriented to person, place, and time. She appears well-developed and well-nourished. No distress.  HENT:  Head: Normocephalic and atraumatic.  Mouth/Throat: Oropharynx is clear and moist.  Eyes: Conjunctivae and EOM are normal.  Neck: Neck supple.  Cardiovascular: Regular rhythm and intact distal pulses. Tachycardia present.  Murmur heard. Pulmonary/Chest: Effort normal and breath sounds normal. No stridor. No respiratory distress. She has no wheezes.  Abdominal: Soft. She exhibits no distension. There is no tenderness.  Obese abd, Small palpable, mobile mass to right abd, ttp, no overlying skin abnormalities  Musculoskeletal: She exhibits no edema.  Neurological: She is alert and oriented to person, place, and time.  Skin: Skin is warm and dry.   Psychiatric: She has a normal mood and affect.  Nursing note and vitals reviewed.    ED Treatments / Results  Labs (all labs ordered are listed, but only abnormal results are displayed) Labs Reviewed  BASIC METABOLIC PANEL - Abnormal; Notable for the following components:      Result Value   Glucose, Bld 142 (*)    BUN <5 (*)    All other components within normal limits  CBC - Abnormal; Notable for the following components:   WBC 3.8 (*)    Hemoglobin 11.4 (*)    HCT 35.0 (*)  MCV 75.8 (*)    MCH 24.7 (*)    RDW 15.7 (*)    All other components within normal limits  BRAIN NATRIURETIC PEPTIDE - Abnormal; Notable for the following components:   B Natriuretic Peptide 180.2 (*)    All other components within normal limits  SEDIMENTATION RATE - Abnormal; Notable for the following components:   Sed Rate 47 (*)    All other components within normal limits  TROPONIN I - Abnormal; Notable for the following components:   Troponin I 0.05 (*)    All other components within normal limits  GLUCOSE, CAPILLARY - Abnormal; Notable for the following components:   Glucose-Capillary 132 (*)    All other components within normal limits  LIPASE, BLOOD  C-REACTIVE PROTEIN  C3 COMPLEMENT  C4 COMPLEMENT  ANTI-DNA ANTIBODY, DOUBLE-STRANDED  BASIC METABOLIC PANEL  ANA  TROPONIN I  I-STAT TROPONIN, ED  I-STAT BETA HCG BLOOD, ED ([facility], [facility], [facility] ONLY)    EKG EKG Interpretation  Date/Time:  Thursday January 16 2018 13:15:12 EDT Ventricular Rate:  92 PR Interval:  172 QRS Duration: 82 QT Interval:  362 QTC Calculation: 447 R Axis:   70 Text Interpretation:  Sinus rhythm with Premature atrial complexes Possible Anterior infarct , age undetermined No significant change since last tracing Confirmed by Blanchie Dessert [phone]) on 01/16/2018 5:26:17 PM   Radiology Dg Chest 2 View  Result Date: 01/16/2018 CLINICAL DATA:  Palpitations, swelling, recent diagnosis of CHF, chest heaviness, diabetes  mellitus, hypertension EXAM: CHEST - 2 VIEW COMPARISON:  12/24/2017 FINDINGS: Enlargement of cardiac silhouette with pulmonary vascular congestion. Unable to exclude pericardial effusion with observed cardiac configuration. Minimal interstitial prominence is unchanged since the previous exam. Mild RIGHT basilar atelectasis. No acute pulmonary edema, segmental consolidation, pleural effusion or pneumothorax. IMPRESSION: Enlargement of cardiac silhouette with pulmonary vascular congestion and mild RIGHT basilar atelectasis. Cannot exclude pericardial effusion with observed cardiac configuration. Electronically Signed   By: Lavonia Dana M.D.   On: 01/16/2018 14:07    Procedures Procedures (including critical care time)  Medications Ordered in ED Medications  amLODipine (NORVASC) tablet 10 mg (has no administration in time range)  cloNIDine (CATAPRES) tablet 0.3 mg (0.3 mg Oral Given 01/16/18 2156)  hydroxychloroquine (PLAQUENIL) tablet 200 mg (200 mg Oral Given 01/16/18 2156)  metoprolol tartrate (LOPRESSOR) tablet 50 mg (50 mg Oral Given 01/16/18 2156)  tiZANidine (ZANAFLEX) tablet 4 mg (4 mg Oral Given 01/16/18 2155)  sodium chloride flush (NS) 0.9 % injection 3 mL (3 mLs Intravenous Given 01/16/18 2158)  sodium chloride flush (NS) 0.9 % injection 3 mL (has no administration in time range)  0.9 %  sodium chloride infusion (has no administration in time range)  acetaminophen (TYLENOL) tablet 650 mg (has no administration in time range)  ondansetron (ZOFRAN) injection 4 mg (has no administration in time range)  enoxaparin (LOVENOX) injection 40 mg (40 mg Subcutaneous Given 01/16/18 2156)  furosemide (LASIX) injection 40 mg (has no administration in time range)  insulin aspart (novoLOG) injection 0-9 Units (has no administration in time range)  insulin aspart (novoLOG) injection 0-5 Units (0 Units Subcutaneous Not Given 01/16/18 2202)  HYDROcodone-acetaminophen (NORCO/VICODIN) 5-325 MG per tablet 1-2 tablet (2  tablets Oral Given 01/16/18 2044)  methylPREDNISolone sodium succinate (SOLU-MEDROL) 40 mg/mL injection 40 mg (40 mg Intravenous Given 01/16/18 2156)  furosemide (LASIX) injection 40 mg (40 mg Intravenous Given 01/16/18 1929)     Initial Impression / Assessment and Plan / ED Course  I have reviewed  the triage vital signs and the nursing notes.  Pertinent labs & imaging results that were available during my care of the patient were reviewed by me and considered in my medical decision making (see chart for details).     SAUL DORSI is a 42 y.o. female with PMHx of  lupus (on plaquenil), HFpEF, HTN, DM who p/w fatigue, weight gain, intermittent palpitations x  4days. Reviewed and confirmed nursing documentation for past medical history, family history, social history. VS afebrile, wnl. Exam unremarkable- likely lipoma to right abd not concerning for surgical pathology. Ddx includes pericarditis, worsening pericardial effusion, HF exacerbation.   EKG as above with no significant change. Trop mildly elevated at 0.05. Beta hcg neg. BMP unremarkable. CBC with leukopenia 3.8, microcytic anemia with hgb 11.4 (appears baseline). Lipase wnl. BNP elevated at 180. CXR with enlargement of cardiac silhouette with pulmonary congestion. Attempted bedside u/s to evaluate for potential worsening of effusion, but windows difficult to obtain secondary to body habitus. IV lasix given.   Old records reviewed. Labs reviewed by me and used in the medical decision making.  Imaging viewed and interpreted by me and used in the medical decision making (formal interpretation from radiologist). EKG reviewed by me and used in the medical decision making. Admitted to hospitalist.    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

## 2018-01-16 NOTE — ED Notes (Signed)
ED Provider at bedside. 

## 2018-01-16 NOTE — H&P (Signed)
History and Physical    HAMDI VARI Pittman:[id number] DOB: 08/30/1975 DOA: 01/16/2018  PCP: [name], MD   Patient coming from: Home   Chief Complaint: DOE, fatigue, swelling, pleuritic chest discomfort  HPI: [name] is a 42 y.o. female with medical history significant for lupus, chronic diastolic CHF, hypertension, and type 2 diabetes mellitus, now presenting to the emergency department for evaluation of exertional dyspnea, bilateral lower extremity swelling, swelling and tenderness of bilateral wrists, and pleuritic chest discomfort.  Patient was admitted to the hospital with similar symptoms last month, discharged 2 weeks ago with prednisone taper for suspected lupus flare, was doing well at time of discharge, but has experienced return of symptoms since completing the steroid taper.  She reports seeing cardiology since the hospital discharge and underwent echocardiogram, report not available in the EMR, but demonstrated some persistent pericardial effusion per report of patient.  She denies fevers, productive cough, headache, change in vision or hearing, or focal numbness or weakness.  ED Course: Upon arrival to the ED, patient is found to be afebrile, saturating well on room air, and with vitals otherwise normal.  EKG features a sinus rhythm with PAC and chest x-ray is notable for enlarged cardiac silhouette with pulmonary vascular congestion.  Chemistry panel is unremarkable and CBC features a mild leukopenia and stable mild normocytic anemia.  Troponin is normal and BNP is elevated 280.  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  Review of Systems:  All other systems reviewed and apart from HPI, are negative.  Past Medical History:  Diagnosis Date  . Abnormal Pap smear of cervix    colpo, HPV  . Allergy   . Anemia     . CHF (congestive heart failure) ([place])   . Depression    after losses  . Enlarged heart    managed by cardiology  . Fibroid   . Gestational diabetes   . Heart murmur   . Hypertension   . Infection    UTI  . Lupus ([place]) 1999    Past Surgical History:  Procedure Laterality Date  . THERAPEUTIC ABORTION       reports that she has never smoked. She has never used smokeless tobacco. She reports that she drinks alcohol. She reports that she does not use drugs.  Allergies  Allergen Reactions  . Septra [Bactrim] Hives    Family History  Problem Relation Age of Onset  . Cancer Maternal Grandfather        lung  . Diabetes Mother   . Diabetes Maternal Uncle   . Hypertension Maternal Grandmother   . Diabetes Father   . Hearing loss Neg Hx      Prior to Admission medications   Medication Sig Start Date End Date Taking? Authorizing Provider  acetaminophen (TYLENOL) 325 MG tablet Take 2 tablets (650 mg total) by mouth every 4 (four) hours as needed for headache or mild pain. 12/28/17  Yes Georgette Shell, MD  amLODipine (NORVASC) 10 MG tablet Take 1 tablet (10 mg total) by mouth daily. 03/31/12  Yes West, Emily, PA-C  cloNIDine (CATAPRES) 0.3 MG tablet Take 0.3 mg by mouth 2 (two) times daily.   Yes [provider]  hydroxychloroquine (PLAQUENIL) 200 MG tablet Take 200 mg by mouth 2 (two) times daily.   Yes [provider]  medroxyPROGESTERone (DEPO-PROVERA) 150  MG/ML injection Inject 150 mg into the muscle every 3 (three) months.   Yes [provider]  metFORMIN (GLUCOPHAGE) 500 MG tablet Take 1 tablet (500 mg total) by mouth 2 (two) times daily with a meal. Patient taking differently: Take 1,000 mg by mouth 2 (two) times daily with a meal.  12/25/13  Yes Phineas Douglas, Carlos American, PA-C  metoprolol tartrate (LOPRESSOR) 50 MG tablet Take 1 tablet (50 mg total) by mouth 2 (two) times daily. 12/28/17 01/27/18 Yes Georgette Shell, MD  tiZANidine (ZANAFLEX) 4 MG  tablet Take 4 mg by mouth 2 (two) times daily. 11/29/17  Yes [provider]    Physical Exam: Vitals:   01/16/18 1811 01/16/18 1812 01/16/18 1845 01/16/18 1900  BP:    (!) 144/93  Pulse: 91 98 91 91  Resp:      Temp:      TempSrc:      SpO2: 100% 100% 100% 97%      Constitutional: NAD, calm  Eyes: PERTLA, lids and conjunctivae normal ENMT: Mucous membranes are moist. Posterior pharynx clear of any exudate or lesions.   Neck: normal, supple, no masses, no thyromegaly Respiratory: Fine rales bilaterally, no wheezing. Normal respiratory effort.   Cardiovascular: S1 & S2 heard, regular rate and rhythm. Trace BLE edema.  Abdomen: No distension, no tenderness, soft. Bowel sounds active.  Musculoskeletal: no clubbing / cyanosis. Edema and tenderness at bilateral wrists.   Skin: Mild exfoliative dermatitis involving the dorsal wrists bilaterally. Warm, dry, well-perfused. Neurologic: CN 2-12 grossly intact. Sensation intact. Strength 5/5 in all 4 limbs.  Psychiatric: Alert and oriented x 3. Calm, cooperative.     Labs on Admission: I have personally reviewed following labs and imaging studies  CBC: Recent Labs  Lab 01/16/18 1326  WBC 3.8*  HGB 11.4*  HCT 35.0*  MCV 75.8*  PLT 630   Basic Metabolic Panel: Recent Labs  Lab 01/16/18 1326  NA 139  K 3.7  CL 108  CO2 23  GLUCOSE 142*  BUN <5*  CREATININE 0.66  CALCIUM 9.0   GFR: CrCl cannot be calculated (Unknown ideal weight.). Liver Function Tests: No results for input(s): AST, ALT, ALKPHOS, BILITOT, PROT, ALBUMIN in the last 168 hours. Recent Labs  Lab 01/16/18 1326  LIPASE 44   No results for input(s): AMMONIA in the last 168 hours. Coagulation Profile: No results for input(s): INR, PROTIME in the last 168 hours. Cardiac Enzymes: No results for input(s): CKTOTAL, CKMB, CKMBINDEX, TROPONINI in the last 168 hours. BNP (last 3 results) No results for input(s): PROBNP in the last 8760  hours. HbA1C: No results for input(s): HGBA1C in the last 72 hours. CBG: No results for input(s): GLUCAP in the last 168 hours. Lipid Profile: No results for input(s): CHOL, HDL, LDLCALC, TRIG, CHOLHDL, LDLDIRECT in the last 72 hours. Thyroid Function Tests: No results for input(s): TSH, T4TOTAL, FREET4, T3FREE, THYROIDAB in the last 72 hours. Anemia Panel: No results for input(s): VITAMINB12, FOLATE, FERRITIN, TIBC, IRON, RETICCTPCT in the last 72 hours. Urine analysis:    Component Value Date/Time   COLORURINE YELLOW 01/02/2017 [place] 01/02/2017 1433   LABSPEC 1.020 07/19/2017 1317   PHURINE 7.0 07/19/2017 1317   GLUCOSEU 100 (A) 07/19/2017 1317   HGBUR MODERATE (A) 07/19/2017 1317   BILIRUBINUR NEGATIVE 07/19/2017 1317   KETONESUR NEGATIVE 07/19/2017 1317   PROTEINUR 30 (A) 07/19/2017 1317   UROBILINOGEN 1.0 07/19/2017 1317   NITRITE NEGATIVE 07/19/2017 1317   LEUKOCYTESUR  SMALL (A) 07/19/2017 1317   Sepsis Labs: @LABRCNTIP (procalcitonin:4,lacticidven:4) )No results found for this or any previous visit (from the past 240 hour(s)).   Radiological Exams on Admission: Dg Chest 2 View  Result Date: 01/16/2018 CLINICAL DATA:  Palpitations, swelling, recent diagnosis of CHF, chest heaviness, diabetes mellitus, hypertension EXAM: CHEST - 2 VIEW COMPARISON:  12/24/2017 FINDINGS: Enlargement of cardiac silhouette with pulmonary vascular congestion. Unable to exclude pericardial effusion with observed cardiac configuration. Minimal interstitial prominence is unchanged since the previous exam. Mild RIGHT basilar atelectasis. No acute pulmonary edema, segmental consolidation, pleural effusion or pneumothorax. IMPRESSION: Enlargement of cardiac silhouette with pulmonary vascular congestion and mild RIGHT basilar atelectasis. Cannot exclude pericardial effusion with observed cardiac configuration. Electronically Signed   By: Lavonia Dana M.D.   On: 01/16/2018 14:07    EKG:  Independently reviewed. Sinus rhythm, PAC.  Assessment/Plan  1. Lupus pericarditis  - Presents with DOE, fatigue, pleuritic chest pain, and edema/tenderness about the wrists  - She had improved with recent course of steroids, but worsened again since finishing pred taper  - Check acute-phase reactants, ANA, anti-ds DNA, and complement levels  - Continue Plaquenil, start glucocorticoid    2. Acute on chronic diastolic CHF  - Reports increasing edema and SOB since hospital d/c 2 wks ago  - Much of the swelling likely secondary to SLE flare, but there is vascular congestion noted on CXR and mild BNP elevation  - Echo last month with preserved EF, no WMA's, grade 2 diastolic dysfunction, severe LAE, and moderate pericardial effusion - Treated in ED with Lasix 40 mg IV   - SLIV, follow daily wt and I/O's, continue diuresis with Lasix 40 mg IV q12h, continue beta-blocker    3. Microcytic anemia; leukopenia  - Hgb and MCV stable at 11.4 and 75.8 with no bleeding  - WBC is 3,800 on admission without evidence for infection and likely secondary to SLE flare    4. Hypertension  - BP at goal, continue Norvasc, clonidine, and Lopressor     DVT prophylaxis: Lovenox Code Status: Full  Family Communication: Discussed with patient Consults called: None Admission status: Inpatient     Vianne Bulls, MD [facility] Pager [phone]  If 7PM-7AM, please contact night-coverage [email or web address] Password [email or web address]  01/16/2018, 7:50 PM

## 2018-01-16 NOTE — ED Triage Notes (Signed)
Patient presents to ED for assessment of palpitations, thigh swelling, shortness of breath.  Hx of Lupus and recent acute CHF.  Patient was taking Lasix, and was switched to steroids ("they think the CHF came from my lupus").  Patient also noted a large hardened mass in her right abdominal area, palpated by this RN.

## 2018-01-17 ENCOUNTER — Inpatient Hospital Stay (HOSPITAL_COMMUNITY): Payer: Self-pay

## 2018-01-17 ENCOUNTER — Ambulatory Visit: Payer: Medicaid Other

## 2018-01-17 DIAGNOSIS — I5033 Acute on chronic diastolic (congestive) heart failure: Secondary | ICD-10-CM

## 2018-01-17 LAB — ECHOCARDIOGRAM LIMITED
Height: 60 in
Weight: 3473.6 oz

## 2018-01-17 LAB — GLUCOSE, CAPILLARY
Glucose-Capillary: 111 mg/dL — ABNORMAL HIGH (ref 65–99)
Glucose-Capillary: 212 mg/dL — ABNORMAL HIGH (ref 65–99)
Glucose-Capillary: 229 mg/dL — ABNORMAL HIGH (ref 65–99)
Glucose-Capillary: 223 mg/dL — ABNORMAL HIGH (ref 65–99)

## 2018-01-17 LAB — BASIC METABOLIC PANEL
Anion gap: 10 (ref 5–15)
BUN: 7 mg/dL (ref 6–20)
CO2: 21 mmol/L — ABNORMAL LOW (ref 22–32)
Calcium: 8.6 mg/dL — ABNORMAL LOW (ref 8.9–10.3)
Chloride: 104 mmol/L (ref 101–111)
Creatinine, Ser: 0.62 mg/dL (ref 0.44–1.00)
GFR calc Af Amer: >60 mL/min (ref >60)
GFR calc non Af Amer: >60 mL/min (ref >60)
Glucose, Bld: 199 mg/dL — ABNORMAL HIGH (ref 65–99)
Potassium: 3.5 mmol/L (ref 3.5–5.1)
Sodium: 135 mmol/L (ref 135–145)

## 2018-01-17 LAB — TROPONIN I: Troponin I: 0.04 ng/mL (ref <0.03)

## 2018-01-17 MED ORDER — COLCHICINE 0.6 MG PO TABS
0.6000 mg | ORAL_TABLET | Freq: Two times a day (BID) | ORAL | Status: DC
  Administered 2018-01-17 – 2018-01-18 (×2): 0.6 mg via ORAL
  Filled 2018-01-17 (×2): qty 1

## 2018-01-17 MED ORDER — IBUPROFEN 400 MG PO TABS
400.0000 mg | ORAL_TABLET | Freq: Three times a day (TID) | ORAL | Status: DC
  Administered 2018-01-17 – 2018-01-18 (×2): 400 mg via ORAL
  Filled 2018-01-17 (×2): qty 1

## 2018-01-17 NOTE — Progress Notes (Signed)
[facility]                                                                              Patient Demographics  [name], is a 42 y.o. female, DOB - [birth year], BJS:[id number]  Admit date - 01/16/2018   Admitting Physician Vianne Bulls, MD  Outpatient Primary MD for the patient is [name], MD  Outpatient specialists:   LOS - 1  days   Medical records reviewed and are as summarized below:    Chief Complaint  Patient presents with  . Palpitations  . Abdominal Mass       Brief summary   Patient is a 42 year old female with lupus, chronic diastolic CHF, hypertension, type 2 diabetes mellitus, presented to ED with exertional dyspnea, bilateral peripheral edema, swelling and tenderness of bilateral wrists, chest discomfort, with pleuritic component.  Patient was admitted last month with similar symptoms, DC'd on 5/18 with prednisone taper for suspected lupus flare.  Patient reported that her symptoms returned after completing the steroid taper.  Also reports that she followed up with cardiology since her hospital discharge, underwent echo, results not available but demonstrated persistent pericardial effusion.  No fevers, productive cough, headaches or focal numbness or weakness.  Patient was treated with 40 mg IV Lasix x1 in ED.   Assessment & Plan    Principal Problem:   Pleuritic chest pain with dyspnea on exertion, lupus pericarditis -Patient presented with-pleuritic chest pain, dyspnea on exertion, bilateral wrist tenderness  -ESR 47, CRP 0.9, follow ANA, anti-DNA antibody, complement level -Continue Plaquenil, IV steroids.  Recommended patient to follow-up with rheumatology outpatient - will consult cardiology, follows Dr. Virgina Jock  Active Problems:   Lupus ([place]) -Continue Plaquenil, IV steroids, ideally will continue steroid with slow taper until seen by rheumatology  Acute on chronic diastolic CHF, pericardial effusion -Patient reported  increasing DOE, peripheral edema 2D echo 12/25/17 showed preserved EF, no wall motion abnormalities, grade 2 diastolic dysfunction, moderate pericardial effusion. -Patient received Lasix 40 mg IV x1, continue IV Lasix diuresis, feeling better today -Continue strict I's and O's and daily weights, beta-blocker  -Negative balance of 1.8L  Microcytic anemia with leukopenia Likely due to SLE flare,follow H&H closely  Essential hypertension BP stable, continue Norvasc, clonidine, Lopressor  Morbid obesity BMI 47, counseled on diet and weight control  Code Status: Full code DVT Prophylaxis:  Lovenox  Family Communication: Discussed in detail with the patient, all imaging results, lab results explained to the patient  Disposition Plan: When medically ready  Time Spent in minutes 35* minutes  Procedures:  None  Consultants:   Cardiology  Antimicrobials:      Medications  Scheduled Meds: . amLODipine  10 mg Oral Daily  . cloNIDine  0.3 mg Oral BID  . enoxaparin (LOVENOX) injection  40 mg Subcutaneous Q24H  . furosemide  40 mg Intravenous Q12H  . hydroxychloroquine  200 mg Oral BID  . insulin aspart  0-5 Units Subcutaneous QHS  . insulin aspart  0-9 Units Subcutaneous TID WC  . methylPREDNISolone (SOLU-MEDROL) injection  40 mg Intravenous Q12H  . metoprolol tartrate  50 mg Oral BID  .  sodium chloride flush  3 mL Intravenous Q12H  . tiZANidine  4 mg Oral BID   Continuous Infusions: . sodium chloride     PRN Meds:.sodium chloride, acetaminophen, HYDROcodone-acetaminophen, ondansetron (ZOFRAN) IV, sodium chloride flush   Antibiotics   Anti-infectives (From admission, onward)   Start     Dose/Rate Route Frequency Ordered Stop   01/16/18 2200  hydroxychloroquine (PLAQUENIL) tablet 200 mg     200 mg Oral 2 times daily 01/16/18 1950          Subjective:   [name] was seen and examined today.  Feeling slightly better today, currently no chest pain, felt some  palpitations, shortness of breath yesterday. Patient denies dizziness, abdominal pain, N/V/D/C, new weakness, numbess, tingling. No acute events overnight.    Objective:   Vitals:   01/16/18 2038 01/17/18 0614 01/17/18 0931 01/17/18 1157  BP: (!) 159/100 116/71 (!) 147/85 124/83  Pulse: 94 71  82  Resp: 19 18  15   Temp: 99 F (37.2 C) 98.3 F (36.8 C)  98.6 F (37 C)  TempSrc: Oral Oral  Oral  SpO2: 100% 98%  98%  Weight: 98.4 kg (217 lb) 98.5 kg (217 lb 1.6 oz)    Height: 5' (1.524 m)       Intake/Output Summary (Last 24 hours) at 01/17/2018 1301 Last data filed at 01/17/2018 0934 Gross per 24 hour  Intake 240 ml  Output 2075 ml  Net -1835 ml     Wt Readings from Last 3 Encounters:  01/17/18 98.5 kg (217 lb 1.6 oz)  12/28/17 99.1 kg (218 lb 8 oz)  08/14/17 105.2 kg (232 lb)     Exam  General: Alert and oriented x 3, NAD  Eyes:   HEENT:  Atraumatic, normocephalic, normal oropharynx  Cardiovascular: S1 S2 auscultated, Regular rate and rhythm.  Respiratory: Bibasilar crackles  Gastrointestinal: Soft, nontender, nondistended, + bowel sounds  Ext: trace pedal edema bilaterally  Neuro: no new deficit  Musculoskeletal: No digital cyanosis, clubbing, tenderness at the bilateral wrists  Skin: No rashes  Psych: Normal affect and demeanor, alert and oriented x3    Data Reviewed:  I have personally reviewed following labs and imaging studies  Micro Results No results found for this or any previous visit (from the past 240 hour(s)).  Radiology Reports Dg Chest 2 View  Result Date: 01/16/2018 CLINICAL DATA:  Palpitations, swelling, recent diagnosis of CHF, chest heaviness, diabetes mellitus, hypertension EXAM: CHEST - 2 VIEW COMPARISON:  12/24/2017 FINDINGS: Enlargement of cardiac silhouette with pulmonary vascular congestion. Unable to exclude pericardial effusion with observed cardiac configuration. Minimal interstitial prominence is unchanged since the previous  exam. Mild RIGHT basilar atelectasis. No acute pulmonary edema, segmental consolidation, pleural effusion or pneumothorax. IMPRESSION: Enlargement of cardiac silhouette with pulmonary vascular congestion and mild RIGHT basilar atelectasis. Cannot exclude pericardial effusion with observed cardiac configuration. Electronically Signed   By: Lavonia Dana M.D.   On: 01/16/2018 14:07   Dg Chest 2 View  Result Date: 12/24/2017 CLINICAL DATA:  Shortness of breath and bilateral lower extremity swelling. EXAM: CHEST - 2 VIEW COMPARISON:  PA and lateral chest 12/16/2017 and 11/12/2017. FINDINGS: Marked enlargement of the cardiopericardial silhouette is unchanged. There is mild basilar atelectasis. Mild vascular congestion without edema is noted. Aortic atherosclerosis is seen. No bony abnormality. IMPRESSION: No acute disease. Marked enlargement of the cardiopericardial silhouette consistent with cardiomegaly and/or pericardial effusion is chronic and unchanged. Pulmonary vascular congestion without edema. Electronically Signed   By: Marcello Moores  Dalessio M.D.   On: 12/24/2017 18:51    Lab Data:  CBC: Recent Labs  Lab 01/16/18 1326  WBC 3.8*  HGB 11.4*  HCT 35.0*  MCV 75.8*  PLT 262   Basic Metabolic Panel: Recent Labs  Lab 01/16/18 1326 01/17/18 0302  NA 139 135  K 3.7 3.5  CL 108 104  CO2 23 21*  GLUCOSE 142* 199*  BUN <5* 7  CREATININE 0.66 0.62  CALCIUM 9.0 8.6*   GFR: Estimated Creatinine Clearance: 96.5 mL/min (by C-G formula based on SCr of 0.62 mg/dL). Liver Function Tests: No results for input(s): AST, ALT, ALKPHOS, BILITOT, PROT, ALBUMIN in the last 168 hours. Recent Labs  Lab 01/16/18 1326  LIPASE 44   No results for input(s): AMMONIA in the last 168 hours. Coagulation Profile: No results for input(s): INR, PROTIME in the last 168 hours. Cardiac Enzymes: Recent Labs  Lab 01/16/18 2104 01/17/18 0302  TROPONINI 0.05* 0.04*   BNP (last 3 results) No results for input(s):  PROBNP in the last 8760 hours. HbA1C: No results for input(s): HGBA1C in the last 72 hours. CBG: Recent Labs  Lab 01/16/18 2153 01/17/18 0620 01/17/18 1158  GLUCAP 132* 111* 212*   Lipid Profile: No results for input(s): CHOL, HDL, LDLCALC, TRIG, CHOLHDL, LDLDIRECT in the last 72 hours. Thyroid Function Tests: No results for input(s): TSH, T4TOTAL, FREET4, T3FREE, THYROIDAB in the last 72 hours. Anemia Panel: No results for input(s): VITAMINB12, FOLATE, FERRITIN, TIBC, IRON, RETICCTPCT in the last 72 hours. Urine analysis:    Component Value Date/Time   COLORURINE YELLOW 01/02/2017 [place] 01/02/2017 1433   LABSPEC 1.020 07/19/2017 1317   PHURINE 7.0 07/19/2017 1317   GLUCOSEU 100 (A) 07/19/2017 1317   HGBUR MODERATE (A) 07/19/2017 1317   BILIRUBINUR NEGATIVE 07/19/2017 1317   KETONESUR NEGATIVE 07/19/2017 1317   PROTEINUR 30 (A) 07/19/2017 1317   UROBILINOGEN 1.0 07/19/2017 1317   NITRITE NEGATIVE 07/19/2017 1317   LEUKOCYTESUR SMALL (A) 07/19/2017 1317     [name] M.D. [facility] 01/17/2018, 1:01 PM  Pager: [phone] Between 7am to 7pm - call Pager - [phone]  After 7pm go to [email or web address] - password [email or web address]  Call night coverage person covering after 7pm

## 2018-01-17 NOTE — Plan of Care (Signed)
  Problem: Education: Goal: Ability to demonstrate management of disease process will improve Outcome: Progressing   Problem: Activity: Goal: Capacity to carry out activities will improve Outcome: Progressing   Problem: Education: Goal: Knowledge of General Education information will improve Outcome: Progressing

## 2018-01-17 NOTE — Progress Notes (Signed)
  Echocardiogram 2D Echocardiogram has been performed.  [name] 01/17/2018, 3:23 PM

## 2018-01-17 NOTE — Consult Note (Addendum)
Reason for Consult: Shortness of breath Referring Physician: [facility]  [name] is an 42 y.o. Pittman.  HPI:   42 year old [name] with hypertension, morbid obesity, lupus, recent diagnosis of moderate pericardial effusion 12/2017, admitted to the hospital with shortness of breath. Workup so far shows no significant pulmonary edema. She had 3 L diuresis and has had improvement in her symptoms. Troponin and BNP were mildly elevated.   Patient was admitted to the hospital in mid May 2019 with complaints of shortness of breath.  Workup was significant for moderate size pericardial effusion without any significant hemodynamic compromise, and mild aortic stenosis. She did have increased rash on her hands, although without any fever or chills or other constitutional symptoms. She endorsed dry cough for about a month prior to her admission to the hospital with shortness of breath. Limited workup for lupus showed normal dSANA, normal complements.  ANA, CRP were not checked.  It was felt that her pericardial effusion was due to a lupus flare, and she was treated with 21 day steroid taper.   .  Past Medical History:  Diagnosis Date  . Abnormal Pap smear of cervix    colpo, HPV  . Allergy   . Anemia   . CHF (congestive heart failure) ([place])   . Depression    after losses  . Enlarged heart    managed by cardiology  . Fibroid   . Gestational diabetes   . Heart murmur   . Hypertension   . Infection    UTI  . Lupus ([place]) 1999    Past Surgical History:  Procedure Laterality Date  . THERAPEUTIC ABORTION      Family History  Problem Relation Age of Onset  . Cancer Maternal Grandfather        lung  . Diabetes Mother   . Diabetes Maternal Uncle   . Hypertension Maternal Grandmother   . Diabetes Father   . Hearing loss Neg Hx     Social History:  reports that she has never smoked. She has never used smokeless tobacco. She reports that she drinks alcohol. She  reports that she does not use drugs.  Allergies:  Allergies  Allergen Reactions  . Septra [Bactrim] Hives    Medications: I have reviewed the patient's current medications.  Results for orders placed or performed during the hospital encounter of 01/16/18 (from the past 48 hour(s))  Basic metabolic panel     Status: Abnormal   Collection Time: 01/16/18  1:26 PM  Result Value Ref Range   Sodium 139 135 - 145 mmol/L   Potassium 3.7 3.5 - 5.1 mmol/L   Chloride 108 101 - 111 mmol/L   CO2 23 22 - 32 mmol/L   Glucose, Bld 142 (H) 65 - 99 mg/dL   BUN <5 (L) 6 - 20 mg/dL   Creatinine, Ser 0.66 0.44 - 1.00 mg/dL   Calcium 9.0 8.9 - 10.3 mg/dL   GFR calc non Af Amer >60 >60 mL/min   GFR calc Af Amer >60 >60 mL/min    Comment: (NOTE) The eGFR has been calculated using the CKD EPI equation. This calculation has not been validated in all clinical situations. eGFR's persistently <60 mL/min signify possible Chronic Kidney Disease.    Anion gap 8 5 - 15    Comment: Performed at [facility] [address], [place], [place] [address]  CBC     Status: Abnormal   Collection Time: 01/16/18  1:26 PM  Result Value Ref Range   WBC 3.8 (L) 4.0 - 10.5 K/uL   RBC 4.62 3.87 - 5.11 MIL/uL   Hemoglobin 11.4 (L) 12.0 - 15.0 g/dL   HCT 35.0 (L) 36.0 - 46.0 %   MCV 75.8 (L) 78.0 - 100.0 fL   MCH 24.7 (L) 26.0 - 34.0 pg   MCHC 32.6 30.0 - 36.0 g/dL   RDW 15.7 (H) 11.5 - 15.5 %   Platelets 284 150 - 400 K/uL    Comment: Performed at [facility] [address] [place], [place] [address]  Lipase, blood     Status: None   Collection Time: 01/16/18  1:26 PM  Result Value Ref Range   Lipase 44 11 - 51 U/L    Comment: Performed at [facility] [address] [place], [place] [address]  Brain natriuretic peptide     Status: Abnormal   Collection Time: 01/16/18  1:26 PM  Result Value Ref Range   B Natriuretic Peptide 180.2 (H) 0.0 - 100.0 pg/mL    Comment: Performed at [facility] [address], [place], [place] [address]  I-stat troponin, ED     Status: None   Collection Time: 01/16/18  1:35 PM  Result Value Ref Range   Troponin i, poc 0.07 0.00 - 0.08 ng/mL   Comment 3            Comment: Due to the release kinetics of cTnI, a negative result within the first hours of the onset of symptoms does not rule out myocardial infarction with certainty. If myocardial infarction is still suspected, repeat the test at appropriate intervals.   I-Stat beta hCG blood, ED     Status: None   Collection Time: 01/16/18  2:06 PM  Result Value Ref Range   I-stat hCG, quantitative <5.0 <5 mIU/mL   Comment 3            Comment:   GEST. AGE      CONC.  (mIU/mL)   <=1 WEEK        5 - 50     2 WEEKS       50 - 500     3 WEEKS       100 - 10,000     4 WEEKS     1,000 - 30,000        Pittman AND NON-PREGNANT Pittman:     LESS THAN 5 mIU/mL   Sedimentation rate     Status: Abnormal   Collection Time: 01/16/18  8:17 PM  Result Value Ref Range   Sed Rate 47 (H) 0 - 22 mm/hr    Comment: Performed at [facility] [address], [place], [place] [address]  C-reactive protein     Status: None   Collection Time: 01/16/18  8:17 PM  Result Value Ref Range   CRP 0.9 <1.0 mg/dL    Comment: Performed at [facility], [place] [address], [place], [place] [address]  Troponin I (q 6hr x 3)     Status: Abnormal   Collection Time: 01/16/18  9:04 PM  Result Value Ref Range   Troponin I 0.05 (HH) <0.03 ng/mL    Comment: CRITICAL RESULT CALLED TO, READ BACK BY AND VERIFIED WITH: IRBY T,RN 01/16/18 2216 WAYK Performed at [facility], [place] [address] [place], [place] [address]   Glucose, capillary     Status: Abnormal   Collection Time: 01/16/18  9:53 PM  Result Value Ref Range   Glucose-Capillary 132 (H) 65 - 99 mg/dL  Basic metabolic panel     Status: Abnormal   Collection Time: 01/17/18  3:02 AM  Result Value Ref Range   Sodium 135 135 - 145 mmol/L   Potassium  3.5 3.5 - 5.1 mmol/L    Comment: SLIGHT HEMOLYSIS   Chloride 104 101 - 111 mmol/L   CO2 21 (L) 22 - 32 mmol/L   Glucose, Bld 199 (H) 65 - 99 mg/dL   BUN 7 6 - 20 mg/dL   Creatinine, Ser 0.62 0.44 - 1.00 mg/dL   Calcium 8.6 (L) 8.9 - 10.3 mg/dL   GFR calc non Af Amer >60 >60 mL/min   GFR calc Af Amer >60 >60 mL/min    Comment: (NOTE) The eGFR has been calculated using the CKD EPI equation. This calculation has not been validated in all clinical situations. eGFR's persistently <60 mL/min signify possible Chronic Kidney Disease.    Anion gap 10 5 - 15    Comment: Performed at [facility] [address] [place], [place] [address]  Troponin I (q 6hr x 3)     Status: Abnormal   Collection Time: 01/17/18  3:02 AM  Result Value Ref Range   Troponin I 0.04 (HH) <0.03 ng/mL    Comment: CRITICAL VALUE NOTED.  VALUE IS CONSISTENT WITH PREVIOUSLY REPORTED AND CALLED VALUE. Performed at [facility], [place] [address] [place], [place] [address]   Glucose, capillary     Status: Abnormal   Collection Time: 01/17/18  6:20 AM  Result Value Ref Range   Glucose-Capillary 111 (H) 65 - 99 mg/dL  Glucose, capillary     Status: Abnormal   Collection Time: 01/17/18 11:58 AM  Result Value Ref Range   Glucose-Capillary 212 (H) 65 - 99 mg/dL    Dg Chest 2 View  Result Date: 01/16/2018 CLINICAL DATA:  Palpitations, swelling, recent diagnosis of CHF, chest heaviness, diabetes mellitus, hypertension EXAM: CHEST - 2 VIEW COMPARISON:  12/24/2017 FINDINGS: Enlargement of cardiac silhouette with pulmonary vascular congestion. Unable to exclude pericardial effusion with observed cardiac configuration. Minimal interstitial prominence is unchanged since the previous exam. Mild RIGHT basilar atelectasis. No acute pulmonary edema, segmental consolidation, pleural effusion or pneumothorax. IMPRESSION: Enlargement of cardiac silhouette with pulmonary vascular congestion and mild RIGHT basilar atelectasis.  Cannot exclude pericardial effusion with observed cardiac configuration. Electronically Signed   By: Lavonia Dana M.D.   On: 01/16/2018 14:07    Review of Systems  Constitutional: Positive for malaise/fatigue. Negative for chills and fever.  HENT: Negative.   Respiratory: Positive for shortness of breath. Negative for cough and sputum production.   Cardiovascular: Positive for chest pain (Pleuritic), palpitations and leg swelling.  Gastrointestinal: Negative for nausea and vomiting.  Genitourinary: Negative.   Musculoskeletal: Negative.   Skin: Negative for rash.  Neurological: Negative for dizziness and loss of consciousness.  Endo/Heme/Allergies: Negative.   Psychiatric/Behavioral: Negative.   All other systems reviewed and are negative.  Blood pressure 124/83, pulse 82, temperature 98.6 F (37 C), temperature source Oral, resp. rate 16, height 5' (1.524 m), weight 98.5 kg (217 lb 1.6 oz), last menstrual period 12/31/2017, SpO2 98 %. Physical Exam  Nursing note and vitals reviewed. Constitutional: She is oriented to person, place, and time. She appears well-developed and well-nourished. No distress.  Morbidly obese  HENT:  Head: Normocephalic and atraumatic.  Eyes: Pupils are equal, round, and reactive to light.  Conjunctivae are normal.  Neck: No JVD present.  Cardiovascular: Normal rate and regular rhythm. Exam reveals no gallop and no friction rub.  Murmur (II/VI crescndo murmur RUSB) heard. Respiratory: Effort normal and breath sounds normal. She has no wheezes. She has no rales.  GI: Soft. Bowel sounds are normal.  Musculoskeletal: She exhibits edema (trace b/l).  Neurological: She is alert and oriented to person, place, and time. No cranial nerve deficit.  Skin: Skin is warm and dry.  Psychiatric: She has a normal mood and affect.       Echocardiogram 01/17/2018: Study Conclusions  - Left ventricle: Systolic function was normal. The estimated   ejection fraction was  in the range of 60% to 65%. - Aortic valve: Mild thickening of aortic valve. Mean PG 19 mmHg.   Vmax 3 m/sec. AVA 1.4 cm2. Dimensionless index 0.47. Mild aortic   stenosis. - Right atrium: Central venous pressure (est): 3 mm Hg. - Pericardium, extracardiac: Circumferential pericardial effusion   with largest collection posterior to the left ventricle (3.1 cm)   and adjacent to right atrium (2.3 cm). There is minimal effusion   in other locations. No signs of RA/RV collapse/ hemodynamic   compromise. No increase in pericardial effusion compared to   recent inpatient and outpatient echocardiogram in 12/2017.  Study Conclusions  - Left ventricle: The cavity size was normal. There was moderate   concentric hypertrophy. Systolic function was vigorous. The   estimated ejection fraction was in the range of 65% to 70%. Wall   motion was normal; there were no regional wall motion   abnormalities. Features are consistent with a pseudonormal left   ventricular filling pattern, with concomitant abnormal relaxation   and increased filling pressure (grade 2 diastolic dysfunction).   Doppler parameters are consistent with high ventricular filling   pressure. - Aortic valve: There was very mild stenosis. Mean gradient (S): 10   mm Hg. Valve area (VTI): 2.11 cm^2. Valve area (Vmax): 2.26 cm^2.   Valve area (Vmean): 1.98 cm^2. - Mitral valve: There was trivial regurgitation. - Left atrium: The atrium was severely dilated. - Tricuspid valve: There was trivial regurgitation. - Inferior vena cava: The vessel was mildly dilated. The   respirophasic diameter changes were in the normal range (>= 50%). - Pericardium, extracardiac: A moderate, free-flowing pericardial   effusion 2.22 x 1.81cm that was identified circumferential to the   heart. The fluid had no internal echoes. There was no chamber   collapse. There was evidence for mildly increased RV-LV   interaction demonstrated by respirophasic changes  in transmitral   velocities.  Assessment/Recommendations: 42 y/o [place] Pittman w/lupus, hypertension, moderate obesity, admitted with shortness of breath.   Shortness of breath: Likely multifactorial. Chest Xray does not show significant pulmonary edema. She has relatively stable pericardial effusion with no JVD/ IVC dilatation. No tamponade physiology seen. Effusive-constrictive pericarditis less likely. Mild troponin and BNP elevation. ESR elevated. I suspect she has myopericarditis, most likely relates to lupus flare.   Given the posterior location of the effusion, I do not think she will benefit from pericardiocentesis. Agree with steroid therapy. Recommend adding colchicine 0.6 mg twice daily and ibuprofen 400 mg tid. She will need workup and management for lupus.   Cherrie Franca J Luz Mares 01/17/2018, 3:20 PM   Rayan Dyal Esther Hardy, MD [facility] Pager: [phone] Office: [phone] If no answer Cell [phone]

## 2018-01-18 LAB — BASIC METABOLIC PANEL
Anion gap: 10 (ref 5–15)
BUN: 13 mg/dL (ref 6–20)
CO2: 24 mmol/L (ref 22–32)
Calcium: 9.3 mg/dL (ref 8.9–10.3)
Chloride: 102 mmol/L (ref 101–111)
Creatinine, Ser: 0.57 mg/dL (ref 0.44–1.00)
GFR calc Af Amer: >60 mL/min (ref >60)
GFR calc non Af Amer: >60 mL/min (ref >60)
Glucose, Bld: 227 mg/dL — ABNORMAL HIGH (ref 65–99)
Potassium: 4.1 mmol/L (ref 3.5–5.1)
Sodium: 136 mmol/L (ref 135–145)

## 2018-01-18 LAB — ANA: Anti Nuclear Antibody(ANA): POSITIVE — AB

## 2018-01-18 LAB — C4 COMPLEMENT: Complement C4, Body Fluid: 25 mg/dL (ref 14–44)

## 2018-01-18 LAB — ANTI-DNA ANTIBODY, DOUBLE-STRANDED: ds DNA Ab: 1 IU/mL (ref 0–9)

## 2018-01-18 LAB — C3 COMPLEMENT: C3 Complement: 135 mg/dL (ref 82–167)

## 2018-01-18 LAB — GLUCOSE, CAPILLARY: Glucose-Capillary: 236 mg/dL — ABNORMAL HIGH (ref 65–99)

## 2018-01-18 MED ORDER — METFORMIN HCL 1000 MG PO TABS: 1000.0000 mg | ORAL_TABLET | Freq: Two times a day (BID) | ORAL | 3 refills | Status: DC

## 2018-01-18 MED ORDER — FUROSEMIDE 40 MG PO TABS: 40.0000 mg | ORAL_TABLET | Freq: Two times a day (BID) | ORAL | 3 refills | Status: DC

## 2018-01-18 MED ORDER — COLCHICINE 0.6 MG PO TABS: 0.6000 mg | ORAL_TABLET | Freq: Two times a day (BID) | ORAL | 4 refills | Status: DC

## 2018-01-18 MED ORDER — IBUPROFEN 400 MG PO TABS: 400.0000 mg | ORAL_TABLET | Freq: Three times a day (TID) | ORAL | 0 refills | Status: DC

## 2018-01-18 MED ORDER — GLIMEPIRIDE 2 MG PO TABS: 2.0000 mg | ORAL_TABLET | ORAL | 11 refills | Status: DC

## 2018-01-18 MED ORDER — PREDNISONE 10 MG PO TABS: ORAL_TABLET | ORAL | 2 refills | Status: DC

## 2018-01-18 MED ORDER — OMEPRAZOLE 20 MG PO CPDR: 20.0000 mg | DELAYED_RELEASE_CAPSULE | Freq: Every day | ORAL | 3 refills | Status: DC

## 2018-01-18 MED ORDER — HYDROXYCHLOROQUINE SULFATE 200 MG PO TABS: 200.0000 mg | ORAL_TABLET | Freq: Two times a day (BID) | ORAL | 4 refills | Status: DC

## 2018-01-18 MED ORDER — PREDNISONE 20 MG PO TABS
60.0000 mg | ORAL_TABLET | Freq: Once | ORAL | Status: AC
  Administered 2018-01-18: 60 mg via ORAL
  Filled 2018-01-18: qty 3

## 2018-01-18 NOTE — Discharge Summary (Signed)
Physician Discharge Summary   Patient ID: ANALIS DISTLER MRN: [id number] DOB/AGE: Apr 10, 1976 42 y.o.  Admit date: 01/16/2018 Discharge date: 01/18/2018  Primary Care Physician:  [name], MD   Recommendations for Outpatient Follow-up:  1. Follow up with PCP in 1-2 weeks 2. Patient need urgent referral to rheumatology outpatient.  Currently placed on Plaquenil and slow prednisone taper.  Home Health: None  Equipment/Devices:   Discharge Condition: stable  CODE STATUS: FULL Diet recommendation: Carb modified diet   Discharge Diagnoses:    . Lupus ([place]) . Hypertension . Pericardial effusion . Pleuritic chest pain . Chronic diastolic CHF (congestive heart failure) ([place]) . 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 Septra [Bactrim] Hives     DISCHARGE MEDICATIONS: Allergies as of 01/18/2018      Reactions   Septra [bactrim] Hives      Medication List    TAKE these medications   acetaminophen 325 MG tablet Commonly known as:  TYLENOL Take 2 tablets (650 mg total) by mouth every 4 (four) hours as needed for headache or mild pain.   amLODipine 10 MG tablet Commonly known as:  NORVASC Take 1 tablet (10 mg total) by mouth daily.   cloNIDine 0.3 MG tablet Commonly known as:  CATAPRES Take 0.3 mg by mouth 2 (two) times daily.   colchicine 0.6 MG tablet Take 1 tablet (0.6 mg total) by mouth 2 (two) times daily.   furosemide 40 MG tablet Commonly known as:  LASIX Take 1 tablet (40 mg total) by mouth 2 (two) times daily.   glimepiride 2 MG tablet Commonly known as:  AMARYL Take 1 tablet (2 mg total) by mouth every morning.   hydroxychloroquine 200 MG tablet Commonly known as:  PLAQUENIL Take 1 tablet (200 mg total) by mouth 2 (two) times daily.   ibuprofen 400 MG tablet Commonly known as:  ADVIL,MOTRIN Take 1 tablet (400 mg total) by mouth 3 (three) times daily. Over the counter    medroxyPROGESTERone 150 MG/ML injection Commonly known as:  DEPO-PROVERA Inject 150 mg into the muscle every 3 (three) months.   metFORMIN 1000 MG tablet Commonly known as:  GLUCOPHAGE Take 1 tablet (1,000 mg total) by mouth 2 (two) times daily with a meal. What changed:    medication strength  how much to take   metoprolol tartrate 50 MG tablet Commonly known as:  LOPRESSOR Take 1 tablet (50 mg total) by mouth 2 (two) times daily.   omeprazole 20 MG capsule Commonly known as:  PRILOSEC Take 1 capsule (20 mg total) by mouth daily.   predniSONE 10 MG tablet Commonly known as:  DELTASONE Take prednisone 30 (3 tabs) x 1 week, then taper by 5 mg weekly until off. 43m (2.5 tabs) x 1 week, then 220m(2 tabs) x 1 week, then 15 mg (1.5 tabs) x 1 week, then 1094m1 tab) for 1 week, then stay on 5 mg (0.5 tab) until you see the rheumatologist.   tiZANidine 4 MG tablet Commonly known as:  ZANAFLEX Take 4 mg by mouth 2 (two) times daily.        Brief H and P: For complete details please refer to admission H and P, but in brief Patient is a 42 2ar old female with lupus, chronic diastolic CHF, hypertension, type 2 diabetes mellitus, presented to ED with exertional dyspnea, bilateral peripheral edema, swelling and tenderness of bilateral wrists, chest discomfort, with pleuritic component.  Patient  was admitted last month with similar symptoms, DC'd on 5/18 with prednisone taper for suspected lupus flare.  Patient reported that her symptoms returned after completing the steroid taper.  Also reports that she followed up with cardiology since her hospital discharge, underwent echo, results not available but demonstrated persistent pericardial effusion.  No fevers, productive cough, headaches or focal numbness or weakness.  Patient was treated with 40 mg IV Lasix x1 in ED.   Hospital Course:  Pleuritic chest pain with dyspnea on exertion, lupus pericarditis -Patient presented with-pleuritic  chest pain, dyspnea on exertion, bilateral wrist tenderness  -ESR 47, CRP 0.9, complement levels normal.  Follow ANA, anti-DNA antibody -Patient was placed on Plaquenil and IV steroids.   -She feels much improved clinically and at the time of discharge placed on slow prednisone taper by 5 mg weekly and stay on 5 mg oral prednisone until she is followed by rheumatology.  She states that rheumatology referral has been sent by her PCP.   -She was also placed on colchicine and ibuprofen by cardiology, Dr Virgina Jock.  2D echo showed EF of 60 to 65%, no increase in pericardial effusion compared to prior echo in 12/2017. Added PPI while on steroids and ibuprofen.   Active Problems:   Lupus ([place]) -As #1, continue Plaquenil and slow steroid taper, by 5 mg weekly.  Needs outpatient rheumatology work-up and management with immunosuppressants.   Acute on chronic diastolic CHF, pericardial effusion -Patient reported increasing DOE, peripheral edema 2D echo 12/25/17 showed preserved EF, no wall motion abnormalities, grade 2 diastolic dysfunction, moderate pericardial effusion. -Placed on IV Lasix for diuresis while inpatient, negative balance of 4.7 L.  Transition to oral Lasix 40 mg twice a day at discharge    Microcytic anemia with leukopenia Likely due to SLE flare, currently stable  Essential hypertension BP stable, continue Norvasc, clonidine, Lopressor, added Lasix  Morbid obesity BMI 47, counseled on diet and weight control   Type 2 diabetes mellitus with hyperglycemia, non-insulin-dependent -Hemoglobin A1c 6.9 -Non-insulin-dependent, CBGs expected to rise with steroids, placed on metformin 1026m twice a day and added Amaryl 2 mg daily    Day of Discharge S: Feeling a lot better today  BP (!) 157/90 (BP Location: Left Arm)   Pulse 99   Temp 98.3 F (36.8 C) (Oral)   Resp 16   Ht 5' (1.524 m)   Wt 98.5 kg (217 lb 1.6 oz)   LMP 12/31/2017 (Approximate)   SpO2 97%   BMI 42.40  kg/m   Physical Exam: General: Alert and awake oriented x3 not in any acute distress. HEENT: anicteric sclera, pupils reactive to light and accommodation CVS: S1-S2 clear no murmur rubs or gallops Chest: Decreased breath sound at the bases but fairly clear Abdomen: soft nontender, nondistended, normal bowel sounds Extremities: no cyanosis, clubbing or edema noted bilaterally, wrists are feeling better, swelling has improved. Neuro: Cranial nerves II-XII intact, no focal neurological deficits   The results of significant diagnostics from this hospitalization (including imaging, microbiology, ancillary and laboratory) are listed below for reference.      Procedures/Studies:  Dg Chest 2 View  Result Date: 01/16/2018 CLINICAL DATA:  Palpitations, swelling, recent diagnosis of CHF, chest heaviness, diabetes mellitus, hypertension EXAM: CHEST - 2 VIEW COMPARISON:  12/24/2017 FINDINGS: Enlargement of cardiac silhouette with pulmonary vascular congestion. Unable to exclude pericardial effusion with observed cardiac configuration. Minimal interstitial prominence is unchanged since the previous exam. Mild RIGHT basilar atelectasis. No acute pulmonary edema, segmental consolidation, pleural effusion  or pneumothorax. IMPRESSION: Enlargement of cardiac silhouette with pulmonary vascular congestion and mild RIGHT basilar atelectasis. Cannot exclude pericardial effusion with observed cardiac configuration. Electronically Signed   By: Lavonia Dana M.D.   On: 01/16/2018 14:07   Dg Chest 2 View  Result Date: 12/24/2017 CLINICAL DATA:  Shortness of breath and bilateral lower extremity swelling. EXAM: CHEST - 2 VIEW COMPARISON:  PA and lateral chest 12/16/2017 and 11/12/2017. FINDINGS: Marked enlargement of the cardiopericardial silhouette is unchanged. There is mild basilar atelectasis. Mild vascular congestion without edema is noted. Aortic atherosclerosis is seen. No bony abnormality. IMPRESSION: No acute  disease. Marked enlargement of the cardiopericardial silhouette consistent with cardiomegaly and/or pericardial effusion is chronic and unchanged. Pulmonary vascular congestion without edema. Electronically Signed   By: Inge Rise M.D.   On: 12/24/2017 18:51       LAB RESULTS: Basic Metabolic Panel: Recent Labs  Lab 01/17/18 0302 01/18/18 0718  NA 135 136  K 3.5 4.1  CL 104 102  CO2 21* 24  GLUCOSE 199* 227*  BUN 7 13  CREATININE 0.62 0.57  CALCIUM 8.6* 9.3   Liver Function Tests: No results for input(s): AST, ALT, ALKPHOS, BILITOT, PROT, ALBUMIN in the last 168 hours. Recent Labs  Lab 01/16/18 1326  LIPASE 44   No results for input(s): AMMONIA in the last 168 hours. CBC: Recent Labs  Lab 01/16/18 1326  WBC 3.8*  HGB 11.4*  HCT 35.0*  MCV 75.8*  PLT 284   Cardiac Enzymes: Recent Labs  Lab 01/16/18 2104 01/17/18 0302  TROPONINI 0.05* 0.04*   BNP: Invalid input(s): POCBNP CBG: Recent Labs  Lab 01/17/18 2048 01/18/18 0606  GLUCAP 223* 236*      Disposition and Follow-up: Discharge Instructions    Diet Carb Modified   Complete by:  As directed    Increase activity slowly   Complete by:  As directed        DISPOSITION: [facility] Follow up.   Why:  Please call on Monday 01/20/18 before 12noon and ask for appointment (let them know you need f/u discharged from hospital) ask about establishing care and orange card at appointment.  Contact information: [place] [address] [phone]       [name], MD. Schedule an appointment as soon as possible for a visit in 1 week(s).   Specialty:  Cardiology Contact information: [place] [id number] [phone]            Time coordinating discharge:  35 minutes  Signed:   Estill Cotta M.D. [facility] 01/18/2018, 11:18  AM Pager: [phone]

## 2018-01-18 NOTE — Progress Notes (Signed)
D/c instructions and prescriptions given to pt, all questions answered. IV removed, clean and intact. Telemetry removed. Pt friend to escort home.  Clyde Canterbury, RN

## 2018-01-24 ENCOUNTER — Other Ambulatory Visit: Payer: Self-pay

## 2018-01-24 ENCOUNTER — Ambulatory Visit (INDEPENDENT_AMBULATORY_CARE_PROVIDER_SITE_OTHER): Payer: Medicaid Other

## 2018-01-24 VITALS — BP 130/80 | HR 86 | Wt 213.0 lb

## 2018-01-24 DIAGNOSIS — Z3042 Encounter for surveillance of injectable contraceptive: Secondary | ICD-10-CM

## 2018-01-24 MED ORDER — MEDROXYPROGESTERONE ACETATE 150 MG/ML IM SUSP
150.0000 mg | Freq: Once | INTRAMUSCULAR | Status: AC
  Administered 2018-01-24: 150 mg via INTRAMUSCULAR

## 2018-01-24 NOTE — Progress Notes (Signed)
Date last pap: 09/26/2016. Last Depo-Provera: 11/01/2017. Side Effects if any: n/a. Serum HCG indicated? n/a. Depo-Provera 150 mg IM given by: S.Levens,CMA(AAMA). Next appointment due Aug 30-Sept 13.

## 2018-01-30 ENCOUNTER — Ambulatory Visit: Payer: Self-pay | Attending: Family Medicine | Admitting: Physician Assistant

## 2018-01-30 VITALS — BP 106/71 | HR 82 | Temp 98.4°F | Resp 18 | Ht 65.0 in | Wt 215.0 lb

## 2018-01-30 DIAGNOSIS — D72819 Decreased white blood cell count, unspecified: Secondary | ICD-10-CM | POA: Insufficient documentation

## 2018-01-30 DIAGNOSIS — M3212 Pericarditis in systemic lupus erythematosus: Secondary | ICD-10-CM | POA: Insufficient documentation

## 2018-01-30 DIAGNOSIS — Z794 Long term (current) use of insulin: Secondary | ICD-10-CM | POA: Insufficient documentation

## 2018-01-30 DIAGNOSIS — I10 Essential (primary) hypertension: Secondary | ICD-10-CM

## 2018-01-30 DIAGNOSIS — Z09 Encounter for follow-up examination after completed treatment for conditions other than malignant neoplasm: Secondary | ICD-10-CM

## 2018-01-30 DIAGNOSIS — M329 Systemic lupus erythematosus, unspecified: Secondary | ICD-10-CM | POA: Insufficient documentation

## 2018-01-30 DIAGNOSIS — D509 Iron deficiency anemia, unspecified: Secondary | ICD-10-CM | POA: Insufficient documentation

## 2018-01-30 DIAGNOSIS — F329 Major depressive disorder, single episode, unspecified: Secondary | ICD-10-CM | POA: Insufficient documentation

## 2018-01-30 DIAGNOSIS — Z79899 Other long term (current) drug therapy: Secondary | ICD-10-CM | POA: Insufficient documentation

## 2018-01-30 DIAGNOSIS — I110 Hypertensive heart disease with heart failure: Secondary | ICD-10-CM | POA: Insufficient documentation

## 2018-01-30 DIAGNOSIS — I3139 Other pericardial effusion (noninflammatory): Secondary | ICD-10-CM

## 2018-01-30 DIAGNOSIS — E1165 Type 2 diabetes mellitus with hyperglycemia: Secondary | ICD-10-CM | POA: Insufficient documentation

## 2018-01-30 DIAGNOSIS — I313 Pericardial effusion (noninflammatory): Secondary | ICD-10-CM | POA: Insufficient documentation

## 2018-01-30 DIAGNOSIS — Z6841 Body Mass Index (BMI) 40.0 and over, adult: Secondary | ICD-10-CM | POA: Insufficient documentation

## 2018-01-30 DIAGNOSIS — I5033 Acute on chronic diastolic (congestive) heart failure: Secondary | ICD-10-CM | POA: Insufficient documentation

## 2018-01-30 DIAGNOSIS — Z882 Allergy status to sulfonamides status: Secondary | ICD-10-CM | POA: Insufficient documentation

## 2018-01-30 DIAGNOSIS — J069 Acute upper respiratory infection, unspecified: Secondary | ICD-10-CM | POA: Insufficient documentation

## 2018-01-30 LAB — GLUCOSE, POCT (MANUAL RESULT ENTRY): POC Glucose: 145 mg/dl — AB (ref 70–99)

## 2018-01-30 MED ORDER — AZITHROMYCIN 250 MG PO TABS: ORAL_TABLET | ORAL | 0 refills | Status: DC

## 2018-01-30 MED ORDER — TRUE METRIX METER W/DEVICE KIT: PACK | 0 refills | Status: DC

## 2018-01-30 MED ORDER — GLUCOSE BLOOD VI STRP: ORAL_STRIP | 12 refills | Status: DC

## 2018-01-30 MED ORDER — TRUEPLUS LANCETS 28G MISC: 1 refills | Status: DC

## 2018-01-30 MED ORDER — BENZONATATE 100 MG PO CAPS: 200.0000 mg | ORAL_CAPSULE | Freq: Three times a day (TID) | ORAL | 0 refills | Status: DC | PRN

## 2018-01-30 MED FILL — HYDROXYCHLOROQUINE SULFATE: 200 | 30 days supply | Qty: 60 | Fill #0

## 2018-01-30 MED FILL — AZITHROMYCIN 250 MG TABLET: 250 | 5 days supply | Qty: 6 | Fill #0

## 2018-01-30 MED FILL — TRUEplus LANCETS 28G MISC: 30 days supply | Qty: 100 | Fill #0

## 2018-01-30 MED FILL — !TRUE METRIX BLOOD GLUCOSE: 365 days supply | Qty: 1 | Fill #0

## 2018-01-30 MED FILL — TRUE METRIX TEST STRIP: 30 days supply | Qty: 100 | Fill #0

## 2018-01-30 MED FILL — BENZONATATE 100 MG CAP: 100 | 7 days supply | Qty: 40 | Fill #0

## 2018-01-30 MED FILL — !COLCRYS 0.6 MG TABLET: 0.6 MG | 8 days supply | Qty: 16 | Fill #0

## 2018-01-30 NOTE — Progress Notes (Signed)
Patient ID: [name], female   DOB: [birth year], 42 y.o.   MRN: [id number]       [name], is a 42 y.o. female  SWF:[id number]  UKG:[id number]  DOB - 1975-12-07  Subjective:  Chief Complaint and HPI: [name] is a 42 y.o. female here today to establish care and for a follow up visit After hospitalization 6/6-01/18/2018 for lupus flare.  Today she presents feeling overall better.  Saw cardiology earlier in the week.  Still needs rheumatology referral.  Has developed cough and cold over the last 5-7 days.  Mucus is thick and green.  +nasal congestion.  OTCs not helping.  No f/c.   Not checking blood sugars.  Doesn't have a meter.  hasnt started amaryl or metformin yet bc hasn't gotten prescriptions filled.  No repeat labs needed today.    Compliant on prednisone taper. No CP, SOB, edema  +FH DM and htn     From discharge summary: For complete details please refer to admission H and P, but in brief Patient is a 42 year old female with lupus, chronic diastolic CHF, hypertension, type 2 diabetes mellitus, presented to ED with exertional dyspnea, bilateral peripheral edema, swelling and tenderness of bilateral wrists, chest discomfort, with pleuritic component. Patient was admitted last month with similar symptoms, DC'd on 5/18 with prednisone taper for suspected lupus flare. Patient reported that her symptoms returned after completing the steroid taper. Also reports that she followed up with cardiology since her hospital discharge, underwent echo, results not available but demonstrated persistent pericardial effusion. No fevers, productive cough, headaches or focal numbness or weakness. Patient was treated with 40 mg IV Lasix x1 in ED.   Hospital Course:  Pleuritic chest painwith dyspnea on exertion, lupus pericarditis -Patient presented with-pleuritic chest pain, dyspnea on exertion, bilateral wrist tenderness -ESR 47, CRP 0.9, complement levels normal.  Follow  ANA, anti-DNA antibody -Patient was placed on Plaquenil and IV steroids.   -She feels much improved clinically and at the time of discharge placed on slow prednisone taper by 5 mg weekly and stay on 5 mg oral prednisone until she is followed by rheumatology.  She states that rheumatology referral has been sent by her PCP.   -She was also placed on colchicine and ibuprofen by cardiology, Dr Virgina Jock.  2D echo showed EF of 60 to 65%, no increase in pericardial effusion compared to prior echo in 12/2017. Added PPI while on steroids and ibuprofen.   Active Problems: Lupus ([place]) -As #1, continue Plaquenil and slow steroid taper, by 5 mg weekly.  Needs outpatient rheumatology work-up and management with immunosuppressants.   Acute on chronic diastolic CHF, pericardial effusion -Patient reported increasing DOE, peripheral edema 2D echo 12/25/17 showed preserved EF, no wall motion abnormalities, grade 2 diastolic dysfunction, moderate pericardial effusion. -Placed on IV Lasix for diuresis while inpatient, negative balance of 4.7 L.  Transition to oral Lasix 40 mg twice a day at discharge    Microcytic anemia with leukopenia Likely due to SLE flare, currently stable  Essential hypertension BP stable, continue Norvasc, clonidine, Lopressor, added Lasix  Morbid obesity BMI 47, counseled on diet and weight control   Type 2 diabetes mellitus with hyperglycemia, non-insulin-dependent -Hemoglobin A1c 6.9 -Non-insulin-dependent, CBGs expected to rise with steroids, placed on metformin 1063m twice a day and added Amaryl 2 mg daily  ED/Hospital notes reviewed and summarized above.   ROS:   Constitutional:  No f/c, No night sweats, No unexplained weight loss. EENT:  No vision changes, No  blurry vision, No hearing changes. No additional mouth, throat, or ear problems other than URI s/sx.  Respiratory: + cough, No SOB Cardiac: No CP, no palpitations GI:  No abd pain, No N/V/D. GU: No  Urinary s/sx Musculoskeletal: No joint pain Neuro: No headache, no dizziness, no motor weakness.  Skin: No rash Endocrine:  No polydipsia. No polyuria.  Psych: Denies SI/HI  No problems updated.  ALLERGIES: Allergies  Allergen Reactions  . Septra [Bactrim] Hives    PAST MEDICAL HISTORY: Past Medical History:  Diagnosis Date  . Abnormal Pap smear of cervix    colpo, HPV  . Allergy   . Anemia   . CHF (congestive heart failure) ([place])   . Depression    after losses  . Enlarged heart    managed by cardiology  . Fibroid   . Gestational diabetes   . Heart murmur   . Hypertension   . Infection    UTI  . Lupus ([place]) 1999    MEDICATIONS AT HOME: Prior to Admission medications   Medication Sig Start Date End Date Taking? Authorizing Provider  acetaminophen (TYLENOL) 325 MG tablet Take 2 tablets (650 mg total) by mouth every 4 (four) hours as needed for headache or mild pain. 12/28/17  Yes Georgette Shell, MD  amLODipine (NORVASC) 10 MG tablet Take 1 tablet (10 mg total) by mouth daily. 03/31/12  Yes West, Emily, PA-C  cloNIDine (CATAPRES) 0.3 MG tablet Take 0.3 mg by mouth 2 (two) times daily.   Yes [provider]  colchicine 0.6 MG tablet Take 1 tablet (0.6 mg total) by mouth 2 (two) times daily. 01/18/18  Yes Rai, Ripudeep K, MD  furosemide (LASIX) 40 MG tablet Take 1 tablet (40 mg total) by mouth 2 (two) times daily. 01/18/18  Yes Rai, Ripudeep K, MD  glimepiride (AMARYL) 2 MG tablet Take 1 tablet (2 mg total) by mouth every morning. 01/18/18 01/18/19 Yes Rai, Ripudeep K, MD  hydroxychloroquine (PLAQUENIL) 200 MG tablet Take 1 tablet (200 mg total) by mouth 2 (two) times daily. 01/18/18  Yes Rai, Ripudeep K, MD  ibuprofen (ADVIL,MOTRIN) 400 MG tablet Take 1 tablet (400 mg total) by mouth 3 (three) times daily. Over the counter 01/18/18  Yes Rai, Ripudeep K, MD  medroxyPROGESTERone (DEPO-PROVERA) 150 MG/ML injection Inject 150 mg into the muscle every 3 (three) months.   Yes  [provider]  metFORMIN (GLUCOPHAGE) 1000 MG tablet Take 1 tablet (1,000 mg total) by mouth 2 (two) times daily with a meal. 01/18/18  Yes Rai, Ripudeep K, MD  omeprazole (PRILOSEC) 20 MG capsule Take 1 capsule (20 mg total) by mouth daily. 01/18/18 05/18/18 Yes Rai, Ripudeep K, MD  predniSONE (DELTASONE) 10 MG tablet Take prednisone 30 (3 tabs) x 1 week, then taper by 5 mg weekly until off. 30m (2.5 tabs) x 1 week, then 244m(2 tabs) x 1 week, then 15 mg (1.5 tabs) x 1 week, then 1027m1 tab) for 1 week, then stay on 5 mg (0.5 tab) until you see the rheumatologist. 01/18/18  Yes Rai, Ripudeep K, MD  tiZANidine (ZANAFLEX) 4 MG tablet Take 4 mg by mouth 2 (two) times daily. 11/29/17  Yes [provider]  azithromycin (ZITHROMAX) 250 MG tablet Take 2 today then 1 daily 01/30/18   McCArgentina DonovanA-C  benzonatate (TESSALON) 100 MG capsule Take 2 capsules (200 mg total) by mouth 3 (three) times daily as needed for cough. 01/30/18   McCArgentina DonovanA-C  Blood Glucose Monitoring Suppl (TRUE METRIX METER) w/Device KIT Check blood sugars fasting and at bedtime 01/30/18   Freeman Caldron M, PA-C  glucose blood (TRUE METRIX BLOOD GLUCOSE TEST) test strip Use as instructed 01/30/18   Argentina Donovan, PA-C  metoprolol tartrate (LOPRESSOR) 50 MG tablet Take 1 tablet (50 mg total) by mouth 2 (two) times daily. 12/28/17 01/27/18  Georgette Shell, MD  TRUEPLUS LANCETS 28G MISC Check blood sugars as directed 01/30/18   Argentina Donovan, PA-C     Objective:  EXAM:   Vitals:   01/30/18 0912  BP: 106/71  Pulse: 82  Resp: 18  Temp: 98.4 F (36.9 C)  TempSrc: Oral  SpO2: 100%  Weight: 215 lb (97.5 kg)  Height: 5' 5"  (1.651 m)    General appearance : A&OX3. NAD. Non-toxic-appearing HEENT: Atraumatic and Normocephalic.  PERRLA. EOM intact.  TM full B. Turbinates congested Mouth-MMM, post pharynx WNL w/o erythema, + PND. Neck: supple, no JVD. No cervical lymphadenopathy. No  thyromegaly Chest/Lungs:  Breathing-non-labored, Good air entry bilaterally, breath sounds normal without rales, rhonchi, or wheezing  CVS: S1 S2 regular, no murmurs, gallops, rubs  Extremities: Bilateral Lower Ext shows no edema, both legs are warm to touch with = pulse throughout Neurology:  CN II-XII grossly intact, Non focal.   Psych:  TP linear. J/I WNL. Normal speech. Appropriate eye contact and affect.  Skin:  No Rash  Data Review Lab Results  Component Value Date   HGBA1C 6.9 (H) 12/25/2017     Assessment & Plan   1. Type 2 diabetes mellitus with hyperglycemia, without long-term current use of insulin (HCC) Uncontrolled.  Get amaryl and metformin today.  Glucometer ordered - Glucose (CBG) - Blood Glucose Monitoring Suppl (TRUE METRIX METER) w/Device KIT; Check blood sugars fasting and at bedtime  Dispense: 1 kit; Refill: 0 - glucose blood (TRUE METRIX BLOOD GLUCOSE TEST) test strip; Use as instructed  Dispense: 100 each; Refill: 12 - TRUEPLUS LANCETS 28G MISC; Check blood sugars as directed  Dispense: 100 each; Refill: 1  2. Essential hypertension Controlled-continue current regimen  3. Pericardial effusion Keep all cardiology appts  4. Lupus ([place]) Continue regimen and prednisone taper - Ambulatory referral to Rheumatology  5. Hospital discharge follow-up Improving overall - Ambulatory referral to Rheumatology  6. Upper respiratory tract infection, unspecified type - azithromycin (ZITHROMAX) 250 MG tablet; Take 2 today then 1 daily  Dispense: 6 tablet; Refill: 0 - benzonatate (TESSALON) 100 MG capsule; Take 2 capsules (200 mg total) by mouth 3 (three) times daily as needed for cough.  Dispense: 40 capsule; Refill: 0  Spent >30 mins face to face counseling on glucose monitoring, compliance with meds, DM education.  All questions answered. [employer] trader given.   Patient have been counseled extensively about nutrition and exercise  Return in about 3 weeks  (around 02/20/2018) for assign PCP; follow-up DM/htn/lupus.  The patient was given clear instructions to go to ER or return to medical center if symptoms don't improve, worsen or new problems develop. The patient verbalized understanding. The patient was told to call to get lab results if they haven't heard anything in the next week.     Freeman Caldron, PA-C [facility] and [facility] [place], [place]   01/30/2018, 9:23 AM

## 2018-01-30 NOTE — Patient Instructions (Addendum)
Check blood sugars fasting and at bedtime.  Follow diabetic diet.  Diabetes Mellitus and Nutrition When you have diabetes (diabetes mellitus), it is very important to have healthy eating habits because your blood sugar (glucose) levels are greatly affected by what you eat and drink. Eating healthy foods in the appropriate amounts, at about the same times every day, can help you:  Control your blood glucose.  Lower your risk of heart disease.  Improve your blood pressure.  Reach or maintain a healthy weight.  Every person with diabetes is different, and each person has different needs for a meal plan. Your health care provider may recommend that you work with a diet and nutrition specialist (dietitian) to make a meal plan that is best for you. Your meal plan may vary depending on factors such as:  The calories you need.  The medicines you take.  Your weight.  Your blood glucose, blood pressure, and cholesterol levels.  Your activity level.  Other health conditions you have, such as heart or kidney disease.  How do carbohydrates affect me? Carbohydrates affect your blood glucose level more than any other type of food. Eating carbohydrates naturally increases the amount of glucose in your blood. Carbohydrate counting is a method for keeping track of how many carbohydrates you eat. Counting carbohydrates is important to keep your blood glucose at a healthy level, especially if you use insulin or take certain oral diabetes medicines. It is important to know how many carbohydrates you can safely have in each meal. This is different for every person. Your dietitian can help you calculate how many carbohydrates you should have at each meal and for snack. Foods that contain carbohydrates include:  Bread, cereal, rice, pasta, and crackers.  Potatoes and corn.  Peas, beans, and lentils.  Milk and yogurt.  Fruit and juice.  Desserts, such as cakes, cookies, ice cream, and candy.  How  does alcohol affect me? Alcohol can cause a sudden decrease in blood glucose (hypoglycemia), especially if you use insulin or take certain oral diabetes medicines. Hypoglycemia can be a life-threatening condition. Symptoms of hypoglycemia (sleepiness, dizziness, and confusion) are similar to symptoms of having too much alcohol. If your health care provider says that alcohol is safe for you, follow these guidelines:  Limit alcohol intake to no more than 1 drink per day for nonpregnant women and 2 drinks per day for men. One drink equals 12 oz of beer, 5 oz of wine, or 1 oz of hard liquor.  Do not drink on an empty stomach.  Keep yourself hydrated with water, diet soda, or unsweetened iced tea.  Keep in mind that regular soda, juice, and other mixers may contain a lot of sugar and must be counted as carbohydrates.  What are tips for following this plan? Reading food labels  Start by checking the serving size on the label. The amount of calories, carbohydrates, fats, and other nutrients listed on the label are based on one serving of the food. Many foods contain more than one serving per package.  Check the total grams (g) of carbohydrates in one serving. You can calculate the number of servings of carbohydrates in one serving by dividing the total carbohydrates by 15. For example, if a food has 30 g of total carbohydrates, it would be equal to 2 servings of carbohydrates.  Check the number of grams (g) of saturated and trans fats in one serving. Choose foods that have low or no amount of these fats.  Check  the number of milligrams (mg) of sodium in one serving. Most people should limit total sodium intake to less than 2,300 mg per day.  Always check the nutrition information of foods labeled as "low-fat" or "nonfat". These foods may be higher in added sugar or refined carbohydrates and should be avoided.  Talk to your dietitian to identify your daily goals for nutrients listed on the  label. Shopping  Avoid buying canned, premade, or processed foods. These foods tend to be high in fat, sodium, and added sugar.  Shop around the outside edge of the grocery store. This includes fresh fruits and vegetables, bulk grains, fresh meats, and fresh dairy. Cooking  Use low-heat cooking methods, such as baking, instead of high-heat cooking methods like deep frying.  Cook using healthy oils, such as olive, canola, or sunflower oil.  Avoid cooking with butter, cream, or high-fat meats. Meal planning  Eat meals and snacks regularly, preferably at the same times every day. Avoid going long periods of time without eating.  Eat foods high in fiber, such as fresh fruits, vegetables, beans, and whole grains. Talk to your dietitian about how many servings of carbohydrates you can eat at each meal.  Eat 4-6 ounces of lean protein each day, such as lean meat, chicken, fish, eggs, or tofu. 1 ounce is equal to 1 ounce of meat, chicken, or fish, 1 egg, or 1/4 cup of tofu.  Eat some foods each day that contain healthy fats, such as avocado, nuts, seeds, and fish. Lifestyle   Check your blood glucose regularly.  Exercise at least 30 minutes 5 or more days each week, or as told by your health care provider.  Take medicines as told by your health care provider.  Do not use any products that contain nicotine or tobacco, such as cigarettes and e-cigarettes. If you need help quitting, ask your health care provider.  Work with a Social worker or diabetes educator to identify strategies to manage stress and any emotional and social challenges. What are some questions to ask my health care provider?  Do I need to meet with a diabetes educator?  Do I need to meet with a dietitian?  What number can I call if I have questions?  When are the best times to check my blood glucose? Where to find more information:  American Diabetes Association: diabetes.org/food-and-fitness/food  Academy of  Nutrition and Dietetics: PokerClues.dk  [employer] of Diabetes and Digestive and Kidney Diseases (NIH): ContactWire.be Summary  A healthy meal plan will help you control your blood glucose and maintain a healthy lifestyle.  Working with a diet and nutrition specialist (dietitian) can help you make a meal plan that is best for you.  Keep in mind that carbohydrates and alcohol have immediate effects on your blood glucose levels. It is important to count carbohydrates and to use alcohol carefully. This information is not intended to replace advice given to you by your health care provider. Make sure you discuss any questions you have with your health care provider. Document Released: 04/26/2005 Document Revised: 09/03/2016 Document Reviewed: 09/03/2016 Elsevier Interactive Patient Education  [employer].

## 2018-02-25 ENCOUNTER — Encounter: Payer: Self-pay | Admitting: Nurse Practitioner

## 2018-02-25 ENCOUNTER — Ambulatory Visit: Payer: Self-pay | Attending: Nurse Practitioner | Admitting: Nurse Practitioner

## 2018-02-25 VITALS — BP 152/84 | HR 88 | Temp 99.1°F | Ht 65.0 in | Wt 221.0 lb

## 2018-02-25 DIAGNOSIS — I10 Essential (primary) hypertension: Secondary | ICD-10-CM

## 2018-02-25 DIAGNOSIS — Z882 Allergy status to sulfonamides status: Secondary | ICD-10-CM | POA: Insufficient documentation

## 2018-02-25 DIAGNOSIS — R8789 Other abnormal findings in specimens from female genital organs: Secondary | ICD-10-CM

## 2018-02-25 DIAGNOSIS — I509 Heart failure, unspecified: Secondary | ICD-10-CM | POA: Insufficient documentation

## 2018-02-25 DIAGNOSIS — K219 Gastro-esophageal reflux disease without esophagitis: Secondary | ICD-10-CM | POA: Insufficient documentation

## 2018-02-25 DIAGNOSIS — E669 Obesity, unspecified: Secondary | ICD-10-CM | POA: Insufficient documentation

## 2018-02-25 DIAGNOSIS — Z7952 Long term (current) use of systemic steroids: Secondary | ICD-10-CM | POA: Insufficient documentation

## 2018-02-25 DIAGNOSIS — Z6836 Body mass index (BMI) 36.0-36.9, adult: Secondary | ICD-10-CM | POA: Insufficient documentation

## 2018-02-25 DIAGNOSIS — Z833 Family history of diabetes mellitus: Secondary | ICD-10-CM | POA: Insufficient documentation

## 2018-02-25 DIAGNOSIS — Z7984 Long term (current) use of oral hypoglycemic drugs: Secondary | ICD-10-CM | POA: Insufficient documentation

## 2018-02-25 DIAGNOSIS — E1165 Type 2 diabetes mellitus with hyperglycemia: Secondary | ICD-10-CM | POA: Insufficient documentation

## 2018-02-25 DIAGNOSIS — R87618 Other abnormal cytological findings on specimens from cervix uteri: Secondary | ICD-10-CM

## 2018-02-25 DIAGNOSIS — Z79899 Other long term (current) drug therapy: Secondary | ICD-10-CM | POA: Insufficient documentation

## 2018-02-25 DIAGNOSIS — M329 Systemic lupus erythematosus, unspecified: Secondary | ICD-10-CM | POA: Insufficient documentation

## 2018-02-25 DIAGNOSIS — Z8632 Personal history of gestational diabetes: Secondary | ICD-10-CM | POA: Insufficient documentation

## 2018-02-25 DIAGNOSIS — I110 Hypertensive heart disease with heart failure: Secondary | ICD-10-CM | POA: Insufficient documentation

## 2018-02-25 LAB — GLUCOSE, POCT (MANUAL RESULT ENTRY): POC Glucose: 274 mg/dl — AB (ref 70–99)

## 2018-02-25 MED ORDER — LISINOPRIL 5 MG PO TABS: 5.0000 mg | ORAL_TABLET | Freq: Every day | ORAL | 3 refills | Status: DC

## 2018-02-25 MED ORDER — OMEPRAZOLE 20 MG PO CPDR: 20.0000 mg | DELAYED_RELEASE_CAPSULE | Freq: Every day | ORAL | 3 refills | Status: DC

## 2018-02-25 MED ORDER — GLIMEPIRIDE 2 MG PO TABS: 2.0000 mg | ORAL_TABLET | ORAL | 11 refills | Status: DC

## 2018-02-25 MED ORDER — METOPROLOL TARTRATE 50 MG PO TABS: 50.0000 mg | ORAL_TABLET | Freq: Two times a day (BID) | ORAL | 1 refills | Status: DC

## 2018-02-25 MED FILL — OMEPRAZOLE 20 MG CAP: 20 | 30 days supply | Qty: 30 | Fill #0

## 2018-02-25 MED FILL — METOPROLOL TARTRATE 50 MG T: 50 | 30 days supply | Qty: 60 | Fill #0

## 2018-02-25 MED FILL — LISINOPRIL 5 MG TAB: 5 | 30 days supply | Qty: 30 | Fill #0

## 2018-02-25 MED FILL — GLIMEPIRIDE 2 MG TABS: 2 | 30 days supply | Qty: 30 | Fill #0

## 2018-02-25 NOTE — Progress Notes (Signed)
Assessment & Plan:  [name] was seen today for establish care.  Diagnoses and all orders for this visit:  Type 2 diabetes mellitus with hyperglycemia, without long-term current use of insulin (HCC) -     Glucose (CBG) -     glimepiride (AMARYL) 2 MG tablet; Take 1 tablet (2 mg total) by mouth every morning. Continue blood sugar control as discussed in office today, low carbohydrate diet, and regular physical exercise as tolerated, 150 minutes per week (30 min each day, 5 days per week, or 50 min 3 days per week). Keep blood sugar logs with fasting goal of 90-130 mg/dl, post prandial (after you eat) less than 180.  For Hypoglycemia: BS <60 and Hyperglycemia BS >400; contact the clinic ASAP. Annual eye exams and foot exams are recommended.   Essential hypertension -     metoprolol tartrate (LOPRESSOR) 50 MG tablet; Take 1 tablet (50 mg total) by mouth 2 (two) times daily. -     lisinopril (PRINIVIL,ZESTRIL) 5 MG tablet; Take 1 tablet (5 mg total) by mouth daily. Continue all antihypertensives as prescribed.  Remember to bring in your blood pressure log with you for your follow up appointment.  DASH/Mediterranean Diets are healthier choices for HTN.    Gastroesophageal reflux disease, esophagitis presence not specified -     omeprazole (PRILOSEC) 20 MG capsule; Take 1 capsule (20 mg total) by mouth daily. Chronic. INSTRUCTIONS: Avoid GERD Triggers: acidic, spicy or fried foods, caffeine, coffee, sodas,  alcohol and chocolate.   Patient has been counseled on age-appropriate routine health concerns for screening and prevention. These are reviewed and up-to-date. Referrals have been placed accordingly. Immunizations are up-to-date or declined.    Subjective:   Chief Complaint  Patient presents with  . Establish Care    Pt. is here to establish care for diabetes.    HPI [name] 42 y.o. female presents to office today to establish care. She has a history of DM type 2, GERD and  abnormal PAP. She will need to be referred to GYN for colposcopy.   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 She will need to apply for the financial assistance program before I can place referral. She will also need to be referred to rheumatology for her Diagnosis of lupus. Currently taking prednisone 5 mg daily, plaquenil 200 mg BID. She will also need to be referred to Cardiology with history of CHF. Most recent EF 01-17-2018 was 60-65% with normal systolic function.    Type 2 Diabetes Mellitus Disease course has been stable. There are no hypoglycemic symptoms. There are no hypoglycemic complications. Symptoms are stable. There are no diabetic complications. Risk factors for coronary artery disease include family history, dyslipidemia, diabetes mellitus, obesity, hypertension. Current diabetic treatment includes glimepiride 75m daily, metformin 1000 mg BID . Patient is compliant with treatment all of the time and does not monitor her blood glucose at home daily. Weight is  stable. Patient does not follow a generally healthy diet. Patient has not seen a dietician. Patient is not compliant with exercise.   An ACE inhibitor/angiotensin II receptor blocker is being taken. Patient does not see a podiatrist. Eye exam is not current.  Lab Results  Component Value Date   HGBA1C 6.9 (H) 12/25/2017    CHRONIC HYPERTENSION Disease Monitoring  Blood pressure range: Chronic. Blood pressure not well controlled today.  BP Readings from Last 3 Encounters:  02/25/18 (!) 152/84  01/30/18 106/71  01/24/18 130/80  Chest pain: no   Dyspnea: no   Claudication: no  Medication compliance: yes, metoprolol 62m daily, amlodipine 167mdaily. Will add lisinopril low dose today. Clonopine TID has been dc'd.  Medication Side Effects  Lightheadedness: no   Urinary frequency: no   Edema: no   Impotence: no  Preventitive Healthcare:  Exercise: no   Diet Pattern: diet: general  Salt  Restriction:  no   Review of Systems  Constitutional: Negative for fever, malaise/fatigue and weight loss.  HENT: Negative.  Negative for nosebleeds.   Eyes: Negative.  Negative for blurred vision, double vision and photophobia.  Respiratory: Negative.  Negative for cough and shortness of breath.   Cardiovascular: Negative.  Negative for chest pain, palpitations and leg swelling.  Gastrointestinal: Negative.  Negative for heartburn, nausea and vomiting.  Musculoskeletal: Negative.  Negative for myalgias.  Neurological: Negative.  Negative for dizziness, focal weakness, seizures and headaches.       Chronic Pain  Psychiatric/Behavioral: Negative.  Negative for suicidal ideas.    Past Medical History:  Diagnosis Date  . Abnormal Pap smear of cervix    colpo, HPV  . Allergy   . Anemia   . CH[place] ([place]estive heart failure) ([place]  . Depression    after losses  . Enlarged heart    managed by cardiology  . Fibroid   . Gestational diabetes   . Heart murmur   . Hypertension   . Infection    UTI  . [place]    Past Surgical History:  Procedure Laterality Date  . THERAPEUTIC ABORTION      Family History  Problem Relation Age of Onset  . Cancer Maternal Grandfather        lung  . Diabetes Mother   . Hypertension Maternal Grandmother   . Diabetes Father   . Hearing loss Neg Hx     Social History Reviewed with no changes to be made today.   Outpatient Medications Prior to Visit  Medication Sig Dispense Refill  . acetaminophen (TYLENOL) 325 MG tablet Take 2 tablets (650 mg total) by mouth every 4 (four) hours as needed for headache or mild pain.    . Marland KitchenmLODipine (NORVASC) 10 MG tablet Take 1 tablet (10 mg total) by mouth daily. 30 tablet 0  . Blood Glucose Monitoring Suppl (TRUE METRIX METER) w/Device KIT Check blood sugars fasting and at bedtime 1 kit 0  . colchicine 0.6 MG tablet Take 1 tablet (0.6 mg total) by mouth 2 (two) times daily. 60 tablet 4  . furosemide  (LASIX) 40 MG tablet Take 1 tablet (40 mg total) by mouth 2 (two) times daily. 60 tablet 3  . glucose blood (TRUE METRIX BLOOD GLUCOSE TEST) test strip Use as instructed 100 each 12  . hydroxychloroquine (PLAQUENIL) 200 MG tablet Take 1 tablet (200 mg total) by mouth 2 (two) times daily. 60 tablet 4  . ibuprofen (ADVIL,MOTRIN) 400 MG tablet Take 1 tablet (400 mg total) by mouth 3 (three) times daily. Over the counter 90 tablet 0  . metFORMIN (GLUCOPHAGE) 1000 MG tablet Take 1 tablet (1,000 mg total) by mouth 2 (two) times daily with a meal. 60 tablet 3  . predniSONE (DELTASONE) 10 MG tablet Take prednisone 30 (3 tabs) x 1 week, then taper by 5 mg weekly until off. 2568m2.5 tabs) x 1 week, then 46m60m tabs) x 1 week, then 15 mg (1.5 tabs) x 1 week, then 10mg86mtab) for 1 week, then stay  on 5 mg (0.5 tab) until you see the rheumatologist. 120 tablet 2  . TRUEPLUS LANCETS 28G MISC Check blood sugars as directed 100 each 1  . benzonatate (TESSALON) 100 MG capsule Take 2 capsules (200 mg total) by mouth 3 (three) times daily as needed for cough. 40 capsule 0  . cloNIDine (CATAPRES) 0.3 MG tablet Take 0.3 mg by mouth 2 (two) times daily.    Marland Kitchen glimepiride (AMARYL) 2 MG tablet Take 1 tablet (2 mg total) by mouth every morning. 30 tablet 11  . medroxyPROGESTERone (DEPO-PROVERA) 150 MG/ML injection Inject 150 mg into the muscle every 3 (three) months.    Marland Kitchen tiZANidine (ZANAFLEX) 4 MG tablet Take 4 mg by mouth 2 (two) times daily.  2  . azithromycin (ZITHROMAX) 250 MG tablet Take 2 today then 1 daily 6 tablet 0  . metoprolol tartrate (LOPRESSOR) 50 MG tablet Take 1 tablet (50 mg total) by mouth 2 (two) times daily. 60 tablet 1  . omeprazole (PRILOSEC) 20 MG capsule Take 1 capsule (20 mg total) by mouth daily. (Patient not taking: Reported on 02/25/2018) 30 capsule 3  . medroxyPROGESTERone (DEPO-PROVERA) injection 150 mg      No facility-administered medications prior to visit.     Allergies  Allergen  Reactions  . Septra [Bactrim] Hives       Objective:    BP (!) 152/84 (BP Location: Left Arm, Patient Position: Sitting, Cuff Size: Large)   Pulse 88   Temp 99.1 F (37.3 C) (Oral)   Ht _0  (1.651 m)   Wt 221 lb (100.2 kg)   SpO2 99%   BMI 36.78 kg/m  Wt Readings from Last 3 Encounters:  02/25/18 221 lb (100.2 kg)  01/30/18 215 lb (97.5 kg)  01/24/18 213 lb (96.6 kg)    Physical Exam  Constitutional: She is oriented to person, place, and time. She appears well-developed and well-nourished. She is cooperative.  HENT:  Head: Normocephalic and atraumatic.  Eyes: EOM are normal.  Neck: Normal range of motion.  Cardiovascular: Normal rate, regular rhythm and intact distal pulses. Exam reveals no gallop and no friction rub.  Murmur heard. Pulmonary/Chest: Effort normal and breath sounds normal. No tachypnea. No respiratory distress. She has no decreased breath sounds. She has no wheezes. She has no rhonchi. She has no rales. She exhibits no tenderness.  Abdominal: Bowel sounds are normal.  Musculoskeletal: Normal range of motion. She exhibits no edema.  Neurological: She is alert and oriented to person, place, and time. Coordination normal.  Skin: Skin is warm and dry.  Psychiatric: She has a normal mood and affect. Her behavior is normal. Judgment and thought content normal.  Nursing note and vitals reviewed.        Patient has been counseled extensively about nutrition and exercise as well as the importance of adherence with medications and regular follow-up. The patient was given clear instructions to go to ER or return to medical center if symptoms don't improve, worsen or new problems develop. The patient verbalized understanding.   Follow-up: Return in 1 month (on 03/31/2018) for DM/HTN.   Gildardo Pounds, FNP-BC [facility] [place], [place]   02/27/2018, 11:04 PM

## 2018-02-25 NOTE — Patient Instructions (Signed)
Systemic Lupus Erythematosus, Adult Systemic lupus erythematosus is a long-term (chronic) disease that can affect many parts of the body. It can damage the skin, joints, blood vessels, brain, kidneys, lungs, heart, and other internal organs. It causes pain, irritation, and inflammation. Systemic lupus erythematosus is an autoimmune disease. With this type of disease, the body's defense system (immune system) mistakenly attacks normal tissues instead of attacking germs or abnormal growths. What are the causes? The cause of this condition is not known. What increases the risk? This condition is more likely to develop in:  Females.  People of Asian descent.  People of African-American descent.  People who have a family history of the condition.  What are the signs or symptoms? General symptoms include:  Joint pain and swelling (common).  Fever.  Fatigue.  Unusual weight loss or weight gain.  Skin rashes, especially over the nose and cheeks (butterfly rash) and after sun exposure.  Sores inside the mouth or nose.  Other symptoms depend on which parts of the body are affected. They can include:  Shortness of breath.  Chest pain.  Frequent urination.  Blood in the urine.  Seizures.  Mental changes.  Hair loss.  Swollen and tender lymph nodes.  Swelling of the hands or feet.  Symptoms can come and go. A period of time when symptoms get worse or come back is called a flare. A period of time with no symptoms is called a remission. How is this diagnosed? This condition is diagnosed based on symptoms, a medical history, and a physical exam. You may also have tests, including:  Blood tests.  Urine tests.  A chest X-ray.  A skin or kidney biopsy. For this test, a sample of tissue is taken from the skin or kidney and studied under a microscope.  You may be referred to an autoimmune disease specialist (rheumatologist). How is this treated? There is no cure for this  condition, but treatment can keep the disease in remission, help to control symptoms, and prevent damage to the heart, lungs, kidneys, and other organs. Treatment may involve taking a combination of medicines over time. Follow these instructions at home: Medicines  Take medicines only as directed by your health care provider.  Do not take any medicines that contain estrogen without first checking with your health care provider. Estrogen can trigger flares and may increase your risk for blood clots. Lifestyle  Eat a heart-healthy diet.  Stay active as directed by your health care provider.  Do not smoke. If you need help quitting, ask your health care provider.  Protect your skin from the sun by applying sunblock and wearing protective hats and clothing.  Learn as much as you can about your condition and have a good support system in place. Support may come from family, friends, or a lupus support group. General instructions  Keep all follow-up visits as directed by your health care provider. This is important.  Work closely with all of your health care providers to manage your condition.  Let your health care provider know right away if you become pregnant or if you plan to become pregnant. Pregnancy in women with this condition is considered high risk. Contact a health care provider if:  You have a fever.  Your symptoms flare.  You develop new symptoms.  You develop swollen feet or hands.  You develop puffiness around your eyes.  Your medicines are not working.  You have bloody, foamy, or coffee-colored urine.  There are changes in your   urination. For example, you urinate more often at night.  You think that you may be depressed or have anxiety. Get help right away if:  You have chest pain.  You have trouble breathing.  You have a seizure.  You suddenly get a very bad headache.  You suddenly develop facial or body weakness.  You cannot speak.  You cannot  understand speech. This information is not intended to replace advice given to you by your health care provider. Make sure you discuss any questions you have with your health care provider. Document Released: 07/20/2002 Document Revised: 03/25/2016 Document Reviewed: 07/07/2014 Elsevier Interactive Patient Education  2018 [employer]  

## 2018-02-26 ENCOUNTER — Ambulatory Visit: Payer: Medicaid Other | Attending: Nurse Practitioner

## 2018-02-27 ENCOUNTER — Encounter: Payer: Self-pay | Admitting: Nurse Practitioner

## 2018-03-12 ENCOUNTER — Encounter (HOSPITAL_COMMUNITY): Payer: Self-pay

## 2018-03-12 ENCOUNTER — Emergency Department (HOSPITAL_COMMUNITY): Payer: Self-pay

## 2018-03-12 ENCOUNTER — Other Ambulatory Visit: Payer: Self-pay

## 2018-03-12 ENCOUNTER — Inpatient Hospital Stay (HOSPITAL_COMMUNITY)
Admission: EM | Admit: 2018-03-12 | Discharge: 2018-03-15 | DRG: 545 | Disposition: A | Discharge status: Home / Self Care | Payer: Self-pay | Attending: Family Medicine | Admitting: Family Medicine

## 2018-03-12 DIAGNOSIS — I5033 Acute on chronic diastolic (congestive) heart failure: Secondary | ICD-10-CM | POA: Diagnosis present

## 2018-03-12 DIAGNOSIS — I3139 Other pericardial effusion (noninflammatory): Secondary | ICD-10-CM | POA: Diagnosis present

## 2018-03-12 DIAGNOSIS — Z791 Long term (current) use of non-steroidal anti-inflammatories (NSAID): Secondary | ICD-10-CM

## 2018-03-12 DIAGNOSIS — R0781 Pleurodynia: Secondary | ICD-10-CM | POA: Diagnosis present

## 2018-03-12 DIAGNOSIS — D509 Iron deficiency anemia, unspecified: Secondary | ICD-10-CM | POA: Diagnosis present

## 2018-03-12 DIAGNOSIS — I110 Hypertensive heart disease with heart failure: Secondary | ICD-10-CM | POA: Diagnosis present

## 2018-03-12 DIAGNOSIS — I10 Essential (primary) hypertension: Secondary | ICD-10-CM | POA: Diagnosis present

## 2018-03-12 DIAGNOSIS — I160 Hypertensive urgency: Secondary | ICD-10-CM | POA: Diagnosis present

## 2018-03-12 DIAGNOSIS — Z6841 Body Mass Index (BMI) 40.0 and over, adult: Secondary | ICD-10-CM

## 2018-03-12 DIAGNOSIS — E119 Type 2 diabetes mellitus without complications: Secondary | ICD-10-CM | POA: Diagnosis present

## 2018-03-12 DIAGNOSIS — Z833 Family history of diabetes mellitus: Secondary | ICD-10-CM

## 2018-03-12 DIAGNOSIS — Z7984 Long term (current) use of oral hypoglycemic drugs: Secondary | ICD-10-CM

## 2018-03-12 DIAGNOSIS — I509 Heart failure, unspecified: Secondary | ICD-10-CM

## 2018-03-12 DIAGNOSIS — Z8249 Family history of ischemic heart disease and other diseases of the circulatory system: Secondary | ICD-10-CM

## 2018-03-12 DIAGNOSIS — Z801 Family history of malignant neoplasm of trachea, bronchus and lung: Secondary | ICD-10-CM

## 2018-03-12 DIAGNOSIS — M3212 Pericarditis in systemic lupus erythematosus: Principal | ICD-10-CM | POA: Diagnosis present

## 2018-03-12 DIAGNOSIS — Z7952 Long term (current) use of systemic steroids: Secondary | ICD-10-CM

## 2018-03-12 DIAGNOSIS — I2510 Atherosclerotic heart disease of native coronary artery without angina pectoris: Secondary | ICD-10-CM | POA: Diagnosis present

## 2018-03-12 DIAGNOSIS — I313 Pericardial effusion (noninflammatory): Secondary | ICD-10-CM | POA: Diagnosis present

## 2018-03-12 LAB — BASIC METABOLIC PANEL
Anion gap: 10 (ref 5–15)
BUN: 8 mg/dL (ref 6–20)
CO2: 26 mmol/L (ref 22–32)
Calcium: 8.7 mg/dL — ABNORMAL LOW (ref 8.9–10.3)
Chloride: 103 mmol/L (ref 98–111)
Creatinine, Ser: 0.71 mg/dL (ref 0.44–1.00)
GFR calc Af Amer: >60 mL/min (ref >60)
GFR calc non Af Amer: >60 mL/min (ref >60)
Glucose, Bld: 182 mg/dL — ABNORMAL HIGH (ref 70–99)
Potassium: 3.5 mmol/L (ref 3.5–5.1)
Sodium: 139 mmol/L (ref 135–145)

## 2018-03-12 LAB — I-STAT BETA HCG BLOOD, ED (MC, WL, AP ONLY): I-stat hCG, quantitative: <5 m[IU]/mL (ref <5)

## 2018-03-12 LAB — D-DIMER, QUANTITATIVE: D-Dimer, Quant: <0.27 ug/mL-FEU (ref 0.00–0.50)

## 2018-03-12 LAB — CBC
HCT: 38.2 % (ref 36.0–46.0)
Hemoglobin: 12.2 g/dL (ref 12.0–15.0)
MCH: 24.8 pg — ABNORMAL LOW (ref 26.0–34.0)
MCHC: 31.9 g/dL (ref 30.0–36.0)
MCV: 77.8 fL — ABNORMAL LOW (ref 78.0–100.0)
Platelets: 338 10*3/uL (ref 150–400)
RBC: 4.91 MIL/uL (ref 3.87–5.11)
RDW: 15 % (ref 11.5–15.5)
WBC: 6 10*3/uL (ref 4.0–10.5)

## 2018-03-12 LAB — I-STAT TROPONIN, ED: Troponin i, poc: 0.07 ng/mL (ref 0.00–0.08)

## 2018-03-12 LAB — BRAIN NATRIURETIC PEPTIDE: B Natriuretic Peptide: 180.7 pg/mL — ABNORMAL HIGH (ref 0.0–100.0)

## 2018-03-12 MED ORDER — ONDANSETRON 4 MG PO TBDP
4.0000 mg | ORAL_TABLET | Freq: Once | ORAL | Status: AC
  Administered 2018-03-12: 4 mg via ORAL
  Filled 2018-03-12: qty 1

## 2018-03-12 MED ORDER — METHYLPREDNISOLONE SODIUM SUCC 125 MG IJ SOLR
125.0000 mg | Freq: Once | INTRAMUSCULAR | Status: AC
  Administered 2018-03-12: 125 mg via INTRAVENOUS
  Filled 2018-03-12: qty 2

## 2018-03-12 MED ORDER — FUROSEMIDE 10 MG/ML IJ SOLN
40.0000 mg | Freq: Once | INTRAMUSCULAR | Status: AC
  Administered 2018-03-12: 40 mg via INTRAVENOUS
  Filled 2018-03-12: qty 4

## 2018-03-12 MED ORDER — HYDROCODONE-ACETAMINOPHEN 5-325 MG PO TABS
1.0000 | ORAL_TABLET | Freq: Once | ORAL | Status: AC
  Administered 2018-03-12: 1 via ORAL
  Filled 2018-03-12: qty 1

## 2018-03-12 NOTE — ED Provider Notes (Signed)
[facility] EMERGENCY DEPARTMENT Provider Note   CSN: [id number] Arrival date & time: 03/12/18  1804     History   Chief Complaint Chief Complaint  Patient presents with  . Chest Pain    HPI [name] is a 42 y.o. female.  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  Patient was taking Lasix 40 mg twice daily and has been weaning prednisone since discharge from the hospital.  She is currently on 5 mg of prednisone which she has been on for the last 3 weeks.  She also followed up with her cardiologist about 3 weeks ago who stated she did not need to be on the Lasix 40 mg twice daily but patient has continued taking it.  In the last 1 week she has had a 6 pound weight gain and exertional dyspnea.  Normally she can walk up 3 flights of stairs to her apartment and now she can barely make it up the first flight without sitting down and catching her breath.  She has had worsening cough which is persistent when she lays down at night and wheezing.  She has had no fever or productive cough.  She has had no abdominal pain, nausea or vomiting.  She has diffuse aches and pains in her legs and back but no joint redness or localized swelling.  She denies any urinary symptoms.  No prior history of PE and she does not take anticoagulation.  The history is provided by the patient.  Chest Pain   This is a recurrent problem. Episode onset: about 1 week ago. The problem occurs constantly. The problem has been gradually worsening. Associated symptoms include cough, exertional  chest pressure, lower extremity edema and shortness of breath. Pertinent negatives include no sputum production. Associated symptoms comments: wheezing. She has tried rest (lasix and prednisone) for the symptoms. The treatment provided no relief.    Past Medical History:  Diagnosis Date  . Abnormal Pap smear of cervix    colpo, HPV  . Allergy   . Anemia   . CHF (congestive heart failure) ([place])   . Depression    after losses  . Enlarged heart    managed by cardiology  . Fibroid   . Gestational diabetes   . Heart murmur   . Hypertension   . Infection    UTI  . Lupus ([place]) 1999    Patient Active Problem List   Diagnosis Date Noted  . Pericardial effusion 01/16/2018  . Pleuritic chest pain 01/16/2018  . Chronic diastolic CHF (congestive heart failure) ([place]) 01/16/2018  . 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 Leucopenia   . Exertional dyspnea 12/25/2017  . Lower extremity edema 12/25/2017  . Hypertension 12/25/2017  . Microcytic anemia 12/25/2017  . Diabetes ([place]) 12/25/2017  . Chronic pain of both knees 04/23/2017  . Fibroids 01/20/2015  . Menorrhagia 01/20/2015  . Low grade squamous intraepithelial lesion (LGSIL) on cervical Pap smear on 01/20/15 01/20/2015  . Atypical glandular cells on cervical Pap smear on 01/20/15 01/20/2015  . Lupus ([place]) 07/15/2013    Past Surgical  History:  Procedure Laterality Date  . THERAPEUTIC ABORTION       OB History    Gravida  5   Para  2   Term  0   Preterm  2   AB  3   Living  2     SAB  2   TAB  1   Ectopic  0   Multiple  0   Live Births  2            Home Medications    Prior to Admission medications   Medication Sig Start Date End Date Taking? Authorizing Provider  acetaminophen (TYLENOL) 325 MG tablet Take 2 tablets (650 mg total) by mouth every 4 (four) hours as needed for headache or mild pain. 12/28/17   Georgette Shell, MD  amLODipine (NORVASC) 10 MG tablet Take 1 tablet (10 mg total) by mouth  daily. 03/31/12   Clayton Bibles, PA-C  Blood Glucose Monitoring Suppl (TRUE METRIX METER) w/Device KIT Check blood sugars fasting and at bedtime 01/30/18   Argentina Donovan, PA-C  colchicine 0.6 MG tablet Take 1 tablet (0.6 mg total) by mouth 2 (two) times daily. 01/18/18   Rai, Vernelle Emerald, MD  furosemide (LASIX) 40 MG tablet Take 1 tablet (40 mg total) by mouth 2 (two) times daily. 01/18/18   Rai, Ripudeep K, MD  glimepiride (AMARYL) 2 MG tablet Take 1 tablet (2 mg total) by mouth every morning. 02/25/18 02/25/19  Gildardo Pounds, NP  glucose blood (TRUE METRIX BLOOD GLUCOSE TEST) test strip Use as instructed 01/30/18   Argentina Donovan, PA-C  hydroxychloroquine (PLAQUENIL) 200 MG tablet Take 1 tablet (200 mg total) by mouth 2 (two) times daily. 01/18/18   Rai, Vernelle Emerald, MD  ibuprofen (ADVIL,MOTRIN) 400 MG tablet Take 1 tablet (400 mg total) by mouth 3 (three) times daily. Over the counter 01/18/18   Rai, Vernelle Emerald, MD  lisinopril (PRINIVIL,ZESTRIL) 5 MG tablet Take 1 tablet (5 mg total) by mouth daily. 02/25/18   Gildardo Pounds, NP  metFORMIN (GLUCOPHAGE) 1000 MG tablet Take 1 tablet (1,000 mg total) by mouth 2 (two) times daily with a meal. 01/18/18   Rai, Ripudeep K, MD  metoprolol tartrate (LOPRESSOR) 50 MG tablet Take 1 tablet (50 mg total) by mouth 2 (two) times daily. 02/25/18 03/27/18  Gildardo Pounds, NP  omeprazole (PRILOSEC) 20 MG capsule Take 1 capsule (20 mg total) by mouth daily. 02/25/18 06/25/18  Gildardo Pounds, NP  predniSONE (DELTASONE) 10 MG tablet Take prednisone 30 (3 tabs) x 1 week, then taper by 5 mg weekly until off. 49m (2.5 tabs) x 1 week, then 260m(2 tabs) x 1 week, then 15 mg (1.5 tabs) x 1 week, then 1052m1 tab) for 1 week, then stay on 5 mg (0.5 tab) until you see the rheumatologist. 01/18/18   RaiMendel CorningD  TRUEPLUS LANCETS 28G MISC Check blood sugars as directed 01/30/18   McCArgentina DonovanA-C    Family History Family History  Problem Relation Age of Onset  .  Cancer Maternal Grandfather        lung  . Diabetes Mother   . Hypertension Maternal Grandmother   . Diabetes Father   . Hearing loss Neg Hx     Social History Social History   Tobacco Use  . Smoking status: Never Smoker  . Smokeless tobacco: Never Used  Substance Use Topics  . Alcohol use: Yes    Comment:  ocasionally   . Drug use: No     Allergies   Septra [bactrim]   Review of Systems Review of Systems  Respiratory: Positive for cough and shortness of breath. Negative for sputum production.   Cardiovascular: Positive for chest pain.  All other systems reviewed and are negative.    Physical Exam Updated Vital Signs BP (!) 163/102   Pulse 100   Temp 99.4 F (37.4 C) (Oral)   Resp 20   Ht 5' (1.524 m)   Wt 99.8 kg (220 lb)   SpO2 100%   BMI 42.97 kg/m   Physical Exam  Constitutional: She is oriented to person, place, and time. She appears well-developed and well-nourished. No distress.  HENT:  Head: Normocephalic and atraumatic.  Mouth/Throat: Oropharynx is clear and moist.  Mild swelling over her upper eyelids  Eyes: Pupils are equal, round, and reactive to light. Conjunctivae and EOM are normal.  Neck: Normal range of motion. Neck supple.  Cardiovascular: Regular rhythm and intact distal pulses. Tachycardia present. Exam reveals gallop and S4.  Murmur heard.  Systolic murmur is present with a grade of 2/6. Pulmonary/Chest: Effort normal. No respiratory distress. She has decreased breath sounds in the right lower field and the left lower field. She has no wheezes. She has no rales.  Abdominal: Soft. She exhibits no distension. There is no tenderness. There is no rebound and no guarding.  Musculoskeletal: Normal range of motion. She exhibits no tenderness.       Right lower leg: She exhibits edema.       Left lower leg: She exhibits edema.  Neurological: She is alert and oriented to person, place, and time.  Skin: Skin is warm and dry. No rash noted. No  erythema.  Psychiatric: She has a normal mood and affect. Her behavior is normal.  Nursing note and vitals reviewed.    ED Treatments / Results  Labs (all labs ordered are listed, but only abnormal results are displayed) Labs Reviewed  BASIC METABOLIC PANEL - Abnormal; Notable for the following components:      Result Value   Glucose, Bld 182 (*)    Calcium 8.7 (*)    All other components within normal limits  CBC - Abnormal; Notable for the following components:   MCV 77.8 (*)    MCH 24.8 (*)    All other components within normal limits  BRAIN NATRIURETIC PEPTIDE - Abnormal; Notable for the following components:   B Natriuretic Peptide 180.7 (*)    All other components within normal limits  D-DIMER, QUANTITATIVE (NOT AT [facility])  D-DIMER, QUANTITATIVE (NOT AT [facility])  BRAIN NATRIURETIC PEPTIDE  C-REACTIVE PROTEIN  SEDIMENTATION RATE  TROPONIN I  TROPONIN I  TROPONIN I  C3 COMPLEMENT  C4 COMPLEMENT  I-STAT TROPONIN, ED  I-STAT BETA HCG BLOOD, ED ([facility], [facility], [facility] ONLY)    EKG EKG Interpretation  Date/Time:  Wednesday March 12 2018 18:18:55 EDT Ventricular Rate:  100 PR Interval:  164 QRS Duration: 82 QT Interval:  340 QTC Calculation: 438 R Axis:   107 Text Interpretation:  Sinus rhythm with Premature atrial complexes with Abberant conduction Rightward axis Low voltage QRS new  T wave abnormality, consider inferior ischemia Confirmed by Blanchie Dessert [phone]) on 03/12/2018 9:36:46 PM   Radiology Dg Chest 2 View  Result Date: 03/12/2018 CLINICAL DATA:  Chest and back pain EXAM: CHEST - 2 VIEW COMPARISON:  01/16/2018, 12/24/2017, 12/16/2017 FINDINGS: Cardiomegaly with vascular congestion. Tiny pleural effusions. No focal airspace disease or  pneumothorax. IMPRESSION: Cardiomegaly with vascular congestion and tiny pleural effusions. Electronically Signed   By: Donavan Foil M.D.   On: 03/12/2018 19:47    Procedures Procedures (including critical care time)  Medications  Ordered in ED Medications  methylPREDNISolone sodium succinate (SOLU-MEDROL) 125 mg/2 mL injection 125 mg (has no administration in time range)  furosemide (LASIX) injection 40 mg (has no administration in time range)  HYDROcodone-acetaminophen (NORCO/VICODIN) 5-325 MG per tablet 1 tablet (1 tablet Oral Given 03/12/18 2207)  ondansetron (ZOFRAN-ODT) disintegrating tablet 4 mg (4 mg Oral Given 03/12/18 2207)     Initial Impression / Assessment and Plan / ED Course  I have reviewed the triage vital signs and the nursing notes.  Pertinent labs & imaging results that were available during my care of the patient were reviewed by me and considered in my medical decision making (see chart for details).     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  Also patient had a pericardial effusion in early June which was moderate in size and concern for possible worsening pericardial effusion.  Patient is not in extremis at this time and vital signs are stable.  D-dimer was negative and low suspicion for PE at this time.  Chest x-ray showed cardiomegaly as well as some vascular congestion and pleural effusions which are consistent with the above concerns.  She has no evidence of septic joint and no acute abdominal findings concerning for acute abdominal pathology.  Patient had weaned down to 5 mg of his own but will give IV Solu-Medrol as well as IV Lasix.  Feel that patient would benefit for another echo to ensure no worsening of the pericardial effusion.  Patient's troponin is within normal limits but there have been some mild changes with her EKG with new T wave inversions inferiorly.  11:54 PM Spoke with the hospitalist who will admit the patient but also spoke with Dr. Einar Gip who is her cardiologist and he will consult on the patient.  Final Clinical Impressions(s) / ED Diagnoses   Final diagnoses:   Acute on chronic congestive heart failure, unspecified heart failure type ([place])  Lupus pericarditis [facility])    ED Discharge Orders    None       Blanchie Dessert, MD 03/12/18 2356

## 2018-03-12 NOTE — ED Triage Notes (Signed)
Pt states she had CP that started today. Reporting that she has had pain in her legs and back for a couple of days. Pt also reports exertional SOB.

## 2018-03-12 NOTE — H&P (Signed)
[name] RFX:[id number] DOB: [birth year] DOA: 03/12/2018     PCP: Gildardo Pounds, NP   Outpatient Specialists:  CARDS:   Dr. Einar Gip   Patient arrived to ER on 03/12/18 at [facility]  Patient coming from:   home Lives  With family    Chief Complaint:  Chief Complaint  Patient presents with  . Chest Pain    HPI: [name] is a 42 y.o. female with medical history significant of lupus, chronic diastolic CHF, hypertension, type 2 diabetes mellitus, hx of pericarditis with pericardia effusion anemia, morbid obesity BMI 47      Presented with   Shortness of breath , non productive cough pain all over including chest pain that started today she is have been having generalized pain in her legs and back for few days.  Reports that she has exertional shortness of breath.  She has been compliant with her Lasix and prednisone on 5 mg currently for the past 3 weeks.  She also noticed 6 pound weight gain unable to walk up the stairs but she always does.  No fevers or chills she is more short of breath when she lays down flat worse than baseline no associated nausea vomiting abdominal pain, denies symmetrical leg swelling or travel.  home dose of Lasix does not seem to help.  Admitted last time in the beginning of June 2019 with peripheral edema prior to this had similar admission in May patient has been followed by cardiology has history of persistent pericardial effusion. The symptoms seem to be getting worse after she completes steroid taper. This time after she got down to 5 mg of prednisone a day her symptoms returned. 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  She felt to have acute on chronic diastolic CHF grade 2 diastolic dysfunction responded well with IV Lasix with -4.7 L and transition to oral Lasix 40 mg twice daily. Of note she was seen by cardiology 3 weeks ago and was told that she does no longer need to be on Lasix 40 twice daily but patient persisted to take it because she felt uneasy coming off Lasix  Regarding pertinent Chronic problems: History of diabetes last hemoglobin A1c at 6.9 not on insulin takes metformin thousand milligrams twice a day and Amaryl Pretension on metoprolol and lisinopril  While in ER:  in Short:  D-dimer negative CXR  Fluid overload Troponin negative ECG - t wave inversion Was given a dose of steroids Solu-Medrol 125 and a dose of Lasix 40 mg IV   Ordered SO far:  Orders Placed This Encounter  Procedures  . DG Chest 2 View  . Basic metabolic panel  . CBC  . D-dimer, quantitative (not at [facility])  . Brain natriuretic peptide  . C-reactive protein  . Sedimentation rate  . Troponin I (q 6hr x 3)  . C3 complement  . C4 complement  . Cardiac monitoring  . Saline Lock IV, Maintain IV access  . Consult to hospitalist  . Inpatient consult to Cardiology  . Pulse oximetry, continuous  . I-stat troponin, ED  . I-Stat beta hCG blood, ED  . ED EKG within 10 minutes  Following Medications were ordered in ER: Medications  methylPREDNISolone sodium succinate (SOLU-MEDROL) 125 mg/2 mL injection 125 mg (has no administration in time range)  furosemide (LASIX) injection 40 mg (has no administration in time range)  HYDROcodone-acetaminophen (NORCO/VICODIN) 5-325 MG per tablet 1 tablet (1 tablet Oral Given 03/12/18 2207)  ondansetron (ZOFRAN-ODT) disintegrating tablet 4 mg (4 mg Oral Given 03/12/18 2207)    Significant initial  Findings: Abnormal Labs Reviewed  BASIC METABOLIC PANEL - Abnormal; Notable for the following components:      Result Value   Glucose, Bld 182 (*)    Calcium 8.7 (*)     All other components within normal limits  CBC - Abnormal; Notable for the following components:   MCV 77.8 (*)    MCH 24.8 (*)    All other components within normal limits  BRAIN NATRIURETIC PEPTIDE - Abnormal; Notable for the following components:   B Natriuretic Peptide 180.7 (*)    All other components within normal limits     Na 139 K 3.5  Cr    Stable,  Lab Results  Component Value Date   CREATININE 0.71 03/12/2018   CREATININE 0.57 01/18/2018   CREATININE 0.62 01/17/2018      WBC  6.0  HG/HCT  stable,      Component Value Date/Time   HGB 12.2 03/12/2018 1828   HCT 38.2 03/12/2018 1828      Troponin (Point of Care Test) Recent Labs    03/12/18 1833  TROPIPOC 0.07     BNP (last 3 results) Recent Labs    12/24/17 1840 01/16/18 1326 03/12/18 2155  BNP 228.8* 180.2* 180.7*       UA  not ordered    ECG:  Personally reviewed by me showing: HR : 100 Rhythm: Sinus tachycardia with PAC  no evidence of ischemic changes QTC 438      ED Triage Vitals  Enc Vitals Group     BP 03/12/18 1817 (!) 169/76     Pulse Rate 03/12/18 1817 96     Resp 03/12/18 1817 20     Temp 03/12/18 1817 99.4 F (37.4 C)     Temp Source 03/12/18 1817 Oral     SpO2 03/12/18 1817 98 %     Weight 03/12/18 1814 220 lb (99.8 kg)     Height 03/12/18 1814 5' 5"  (1.651 m)     Head Circumference --      Peak Flow --      Pain Score 03/12/18 1814 10     Pain Loc --      Pain Edu? --      Excl. in [place]? --   TMAX(24)@       Latest  Blood pressure (!) 160/89, pulse 90, temperature 99.4 F (37.4 C), temperature source Oral, resp. rate 20, height 5' (1.524 m), weight 99.8 kg (220 lb), SpO2 100 %.    ER Provider Called:  Cardiology    Dr.Ganji They Recommend cont steroids and lasix will see in consult in AM    Hospitalist was called for admission for acute on chronic diastolic CHF exacerbation in the setting of lupus induced pericarditis with pericardial effusion   Review  of Systems:    Pertinent positives include:  Fatigue, chest pain, shortness of breath at rest. dyspnea on exertion, nasal congestion, rash non-productive cough, wheezing.  Constitutional:   No weight loss, night sweats, Fevers, chills, weight loss  HEENT:  No headaches, Difficulty swallowing,Tooth/dental problems,Sore throat,  No sneezing,  itching, ear ache,  post nasal drip,  Cardio-vascular:  No Orthopnea, PND, anasarca, dizziness, palpitations. no Bilateral lower extremity swelling  GI:  No heartburn, indigestion, abdominal pain, nausea, vomiting, diarrhea, change in bowel habits, loss of appetite, melena, blood in stool, hematemesis Resp:    No excess mucus, no productive cough, No No coughing up of blood. No change in color of mucus.No Skin:  no   lesions. No jaundice GU:  no dysuria, change in color of urine, no urgency or frequency. No straining to urinate.  No flank pain.  Musculoskeletal:  No joint pain or no joint swelling. No decreased range of motion. No back pain.  Psych:  No change in mood or affect. No depression or anxiety. No memory loss.  Neuro: no localizing neurological complaints, no tingling, no weakness, no double vision, no gait abnormality, no slurred speech, no confusion  All systems reviewed and apart from [place] all are negative  Past Medical History:   Past Medical History:  Diagnosis Date  . Abnormal Pap smear of cervix    colpo, HPV  . Allergy   . Anemia   . CHF (congestive heart failure) ([place])   . Depression    after losses  . Enlarged heart    managed by cardiology  . Fibroid   . Gestational diabetes   . Heart murmur   . Hypertension   . Infection    UTI  . Lupus ([place]) 1999      Past Surgical History:  Procedure Laterality Date  . THERAPEUTIC ABORTION      Social History:  Ambulatory   independently       reports that she has never smoked. She has never used smokeless tobacco. She reports that she drinks alcohol. She reports  that she does not use drugs.     Family History:   Family History  Problem Relation Age of Onset  . Cancer Maternal Grandfather        lung  . Diabetes Mother   . Hypertension Maternal Grandmother   . Diabetes Father   . Hearing loss Neg Hx     Allergies: Allergies  Allergen Reactions  . Septra [Bactrim] Hives     Prior to Admission medications   Medication Sig Start Date End Date Taking? Authorizing Provider  acetaminophen (TYLENOL) 325 MG tablet Take 2 tablets (650 mg total) by mouth every 4 (four) hours as needed for headache or mild pain. 12/28/17   Georgette Shell, MD  amLODipine (NORVASC) 10 MG tablet Take 1 tablet (10 mg total) by mouth daily. 03/31/12   Clayton Bibles, PA-C  Blood Glucose Monitoring Suppl (TRUE METRIX METER) w/Device KIT Check blood sugars fasting and at bedtime 01/30/18   Argentina Donovan, PA-C  colchicine 0.6 MG tablet Take 1 tablet (0.6 mg total) by mouth 2 (two) times daily. 01/18/18   Rai, Vernelle Emerald, MD  furosemide (LASIX) 40 MG tablet Take 1 tablet (40 mg total) by mouth 2 (two) times daily. 01/18/18   Rai, Ripudeep K, MD  glimepiride (AMARYL) 2 MG tablet Take 1 tablet (2 mg total) by mouth every morning. 02/25/18 02/25/19  Gildardo Pounds, NP  glucose blood (TRUE METRIX BLOOD GLUCOSE TEST) test strip Use as instructed 01/30/18   Argentina Donovan, PA-C  hydroxychloroquine (PLAQUENIL) 200 MG tablet Take 1 tablet (200 mg total) by mouth 2 (two) times daily. 01/18/18   Rai, Vernelle Emerald, MD  ibuprofen (ADVIL,MOTRIN) 400 MG tablet Take 1 tablet (400 mg total)  by mouth 3 (three) times daily. Over the counter 01/18/18   Rai, Vernelle Emerald, MD  lisinopril (PRINIVIL,ZESTRIL) 5 MG tablet Take 1 tablet (5 mg total) by mouth daily. 02/25/18   Gildardo Pounds, NP  metFORMIN (GLUCOPHAGE) 1000 MG tablet Take 1 tablet (1,000 mg total) by mouth 2 (two) times daily with a meal. 01/18/18   Rai, Ripudeep K, MD  metoprolol tartrate (LOPRESSOR) 50 MG tablet Take 1 tablet (50 mg total)  by mouth 2 (two) times daily. 02/25/18 03/27/18  Gildardo Pounds, NP  omeprazole (PRILOSEC) 20 MG capsule Take 1 capsule (20 mg total) by mouth daily. 02/25/18 06/25/18  Gildardo Pounds, NP  predniSONE (DELTASONE) 10 MG tablet Take prednisone 30 (3 tabs) x 1 week, then taper by 5 mg weekly until off. 22m (2.5 tabs) x 1 week, then 249m(2 tabs) x 1 week, then 15 mg (1.5 tabs) x 1 week, then 1065m1 tab) for 1 week, then stay on 5 mg (0.5 tab) until you see the rheumatologist. 01/18/18   RaiMendel CorningD  TRUEPLUS LANCETS 28G MISC Check blood sugars as directed 01/30/18   McCArgentina DonovanA-C   Physical Exam: Blood pressure (!) 160/89, pulse 90, temperature 99.4 F (37.4 C), temperature source Oral, resp. rate 20, height 5' (1.524 m), weight 99.8 kg (220 lb), SpO2 100 %. 1. General:  in No Acute distress  well  -appearing 2. Psychological: Alert and  Oriented 3. Head/ENT:   Moist  Mucous Membranes                          Head Non traumatic, neck supple                         Poor Dentition 4. SKIN: normal  Skin turgor,  Skin clean Dry and intact no rash 5. Heart: Regular rate and rhythm no  Murmur, no Rub   Gallop present 6. Lungs no wheezes or crackles   7. Abdomen: Soft,  non-tender, Non distended   Obese bowel sounds present 8. Lower extremities: no clubbing, cyanosis, 1+edema 9. Neurologically Grossly intact, moving all 4 extremities equally  10. MSK: Normal range of motion   LABS:     Recent Labs  Lab 03/12/18 1828  WBC 6.0  HGB 12.2  HCT 38.2  MCV 77.8*  PLT [id number] Metabolic Panel: Recent Labs  Lab 03/12/18 1828  NA 139  K 3.5  CL 103  CO2 26  GLUCOSE 182*  BUN 8  CREATININE 0.71  CALCIUM 8.7*      No results for input(s): AST, ALT, ALKPHOS, BILITOT, PROT, ALBUMIN in the last 168 hours. No results for input(s): LIPASE, AMYLASE in the last 168 hours. No results for input(s): AMMONIA in the last 168 hours.    HbA1C: No results for input(s): HGBA1C  in the last 72 hours. CBG: No results for input(s): GLUCAP in the last 168 hours.    Urine analysis:    Component Value Date/Time   COLORURINE YELLOW 01/02/2017 [place]/23/2018 1433   LABSPEC 1.020 07/19/2017 1317   PHURINE 7.0 07/19/2017 1317   GLUCOSEU 100 (A) 07/19/2017 1317   HGBUR MODERATE (A) 07/19/2017 1317   BILIRUBINUR NEGATIVE 07/19/2017 1317   KETONESUR NEGATIVE 07/19/2017 1317   PROTEINUR 30 (A) 07/19/2017 1317   UROBILINOGEN 1.0 07/19/2017 1317   NITRITE NEGATIVE 07/19/2017 1317  LEUKOCYTESUR SMALL (A) 07/19/2017 1317    Cultures:    Component Value Date/Time   SDES URINE, CLEAN CATCH 07/19/2017 1328   SPECREQUEST NONE 07/19/2017 1328   CULT >=100,000 COLONIES/mL ESCHERICHIA COLI (A) 07/19/2017 1328   REPTSTATUS 07/21/2017 FINAL 07/19/2017 1328     Radiological Exams on Admission: Dg Chest 2 View  Result Date: 03/12/2018 CLINICAL DATA:  Chest and back pain EXAM: CHEST - 2 VIEW COMPARISON:  01/16/2018, 12/24/2017, 12/16/2017 FINDINGS: Cardiomegaly with vascular congestion. Tiny pleural effusions. No focal airspace disease or pneumothorax. IMPRESSION: Cardiomegaly with vascular congestion and tiny pleural effusions. Electronically Signed   By: Donavan Foil M.D.   On: 03/12/2018 19:47    Chart has been reviewed    Assessment/Plan   42 y.o. female with medical history significant of lupus, chronic diastolic CHF, hypertension, type 2 diabetes mellitus, hx of pericarditis with pericardia effusion anemia, morbid obesity BMI 47 Admitted for acute on chronic diastolic CHF exacerbation in the setting of lupus induced pericarditis with pericardial effusion  Present on Admission: . Acute on chronic diastolic CHF (congestive heart failure) ([place]) -appreciate cardiology consult will diurese with IV Lasix as patient has responded in the past currently hemodynamically stable Repeat echogram to evaluate pericardial effusion Per patient history of lupus  ANA positive double-stranded DNA antibody is not elevated complement is within normal limits Ro antibody somewhat elevated -unclear picture will need rheumatology follow-up and further work-up to further work-up for connective tissue disorder.   will also order rheumatoid factor repeat SLE panel last one was done 10 years ago In the past responded well to IV solumedrol 40 BID will restart  . Hypertension stable continue home medications  . Lupus pericarditis [facility]) -appreciate cardiology input will need echogram to evaluate pericardial effusion watch for any signs of tamponade on current appears to be hemodynamically stable  . Microcytic anemia - stable continue to monitor    . Pericardial effusion will need to repeat echogram to evaluate progression  . Pleuritic chest pain likely secondary to lupus, pericarditis, d.dimer unremarcable   DM 2 -   Order Sensitive  SSI   -  check TSH and HgA1C  - Hold by mouth medications     Other plan as per orders.   DVT prophylaxis:  SCD    Code Status:  FULL CODE  as per patient  I had personally discussed CODE STATUS with patient   Family Communication:   Family not  at  Bedside    Disposition Plan:       To home once workup is complete and patient is stable                        Consults called:  Cardiology  Dr. Einar Gip  Admission status:   inpatient      Level of care     tele             Toy Baker 03/12/2018, 12:46 AM    [facility]  Pager [phone]   after 2 AM please page floor coverage PA If 7AM-7PM, please contact the day team taking care of the patient  [email or web address]  Password [email or web address]

## 2018-03-13 ENCOUNTER — Inpatient Hospital Stay (HOSPITAL_COMMUNITY): Payer: Self-pay

## 2018-03-13 DIAGNOSIS — I313 Pericardial effusion (noninflammatory): Secondary | ICD-10-CM

## 2018-03-13 DIAGNOSIS — I5033 Acute on chronic diastolic (congestive) heart failure: Secondary | ICD-10-CM

## 2018-03-13 DIAGNOSIS — I10 Essential (primary) hypertension: Secondary | ICD-10-CM

## 2018-03-13 DIAGNOSIS — D509 Iron deficiency anemia, unspecified: Secondary | ICD-10-CM

## 2018-03-13 DIAGNOSIS — R0781 Pleurodynia: Secondary | ICD-10-CM

## 2018-03-13 DIAGNOSIS — M3212 Pericarditis in systemic lupus erythematosus: Principal | ICD-10-CM

## 2018-03-13 LAB — COMPREHENSIVE METABOLIC PANEL
ALT: 13 U/L (ref 0–44)
AST: 18 U/L (ref 15–41)
Albumin: 3.5 g/dL (ref 3.5–5.0)
Alkaline Phosphatase: 59 U/L (ref 38–126)
Anion gap: 13 (ref 5–15)
BUN: 13 mg/dL (ref 6–20)
CO2: 24 mmol/L (ref 22–32)
Calcium: 9 mg/dL (ref 8.9–10.3)
Chloride: 101 mmol/L (ref 98–111)
Creatinine, Ser: 0.64 mg/dL (ref 0.44–1.00)
GFR calc Af Amer: >60 mL/min (ref >60)
GFR calc non Af Amer: >60 mL/min (ref >60)
Glucose, Bld: 258 mg/dL — ABNORMAL HIGH (ref 70–99)
Potassium: 4 mmol/L (ref 3.5–5.1)
Sodium: 138 mmol/L (ref 135–145)
Total Bilirubin: 0.6 mg/dL (ref 0.3–1.2)
Total Protein: 8.2 g/dL — ABNORMAL HIGH (ref 6.5–8.1)

## 2018-03-13 LAB — TROPONIN I
Troponin I: 0.05 ng/mL (ref <0.03)
Troponin I: 0.04 ng/mL (ref <0.03)
Troponin I: 0.03 ng/mL (ref <0.03)
Troponin I: 0.03 ng/mL (ref <0.03)
Troponin I: 0.03 ng/mL (ref <0.03)

## 2018-03-13 LAB — GLUCOSE, CAPILLARY
Glucose-Capillary: 223 mg/dL — ABNORMAL HIGH (ref 70–99)
Glucose-Capillary: 292 mg/dL — ABNORMAL HIGH (ref 70–99)
Glucose-Capillary: 266 mg/dL — ABNORMAL HIGH (ref 70–99)
Glucose-Capillary: 219 mg/dL — ABNORMAL HIGH (ref 70–99)
Glucose-Capillary: 301 mg/dL — ABNORMAL HIGH (ref 70–99)

## 2018-03-13 LAB — CBC
HCT: 40.3 % (ref 36.0–46.0)
Hemoglobin: 12.9 g/dL (ref 12.0–15.0)
MCH: 24.8 pg — ABNORMAL LOW (ref 26.0–34.0)
MCHC: 32 g/dL (ref 30.0–36.0)
MCV: 77.5 fL — ABNORMAL LOW (ref 78.0–100.0)
Platelets: 291 10*3/uL (ref 150–400)
RBC: 5.2 MIL/uL — ABNORMAL HIGH (ref 3.87–5.11)
RDW: 14.8 % (ref 11.5–15.5)
WBC: 7.8 10*3/uL (ref 4.0–10.5)

## 2018-03-13 LAB — ECHOCARDIOGRAM COMPLETE
Height: 60 in
Weight: 3550.4 oz

## 2018-03-13 LAB — SEDIMENTATION RATE: Sed Rate: 41 mm/hr — ABNORMAL HIGH (ref 0–22)

## 2018-03-13 LAB — PHOSPHORUS: Phosphorus: 4.2 mg/dL (ref 2.5–4.6)

## 2018-03-13 LAB — TSH: TSH: 1.041 u[IU]/mL (ref 0.350–4.500)

## 2018-03-13 LAB — BRAIN NATRIURETIC PEPTIDE: B Natriuretic Peptide: 229.2 pg/mL — ABNORMAL HIGH (ref 0.0–100.0)

## 2018-03-13 LAB — HEMOGLOBIN A1C
Hgb A1c MFr Bld: 9.1 % — ABNORMAL HIGH (ref 4.8–5.6)
Mean Plasma Glucose: 214.47 mg/dL

## 2018-03-13 LAB — MAGNESIUM: Magnesium: 1.3 mg/dL — ABNORMAL LOW (ref 1.7–2.4)

## 2018-03-13 LAB — C-REACTIVE PROTEIN: CRP: 0.9 mg/dL (ref <1.0)

## 2018-03-13 LAB — D-DIMER, QUANTITATIVE (NOT AT ARMC): D-Dimer, Quant: <0.27 ug/mL-FEU (ref 0.00–0.50)

## 2018-03-13 MED ORDER — PANTOPRAZOLE SODIUM 40 MG PO TBEC
40.0000 mg | DELAYED_RELEASE_TABLET | Freq: Every day | ORAL | Status: DC
  Administered 2018-03-13 – 2018-03-15 (×3): 40 mg via ORAL
  Filled 2018-03-13 (×3): qty 1

## 2018-03-13 MED ORDER — GADOBENATE DIMEGLUMINE 529 MG/ML IV SOLN
33.0000 mL | Freq: Once | INTRAVENOUS | Status: AC | PRN
  Administered 2018-03-13: 33 mL via INTRAVENOUS

## 2018-03-13 MED ORDER — SODIUM CHLORIDE 0.9% FLUSH: 3.0000 mL | INTRAVENOUS | Status: DC | PRN

## 2018-03-13 MED ORDER — COLCHICINE 0.6 MG PO TABS
0.6000 mg | ORAL_TABLET | Freq: Two times a day (BID) | ORAL | Status: DC
  Administered 2018-03-13 – 2018-03-15 (×6): 0.6 mg via ORAL
  Filled 2018-03-13 (×6): qty 1

## 2018-03-13 MED ORDER — ONDANSETRON HCL 4 MG PO TABS
4.0000 mg | ORAL_TABLET | Freq: Four times a day (QID) | ORAL | Status: DC | PRN
  Administered 2018-03-13: 4 mg via ORAL
  Filled 2018-03-13: qty 1

## 2018-03-13 MED ORDER — SODIUM CHLORIDE 0.9% FLUSH
3.0000 mL | Freq: Two times a day (BID) | INTRAVENOUS | Status: DC
  Administered 2018-03-13 – 2018-03-14 (×4): 3 mL via INTRAVENOUS

## 2018-03-13 MED ORDER — INSULIN ASPART 100 UNIT/ML ~~LOC~~ SOLN
0.0000 [IU] | Freq: Three times a day (TID) | SUBCUTANEOUS | Status: DC
  Administered 2018-03-13 (×2): 8 [IU] via SUBCUTANEOUS
  Administered 2018-03-13: 5 [IU] via SUBCUTANEOUS
  Administered 2018-03-14: 8 [IU] via SUBCUTANEOUS
  Administered 2018-03-14 – 2018-03-15 (×4): 5 [IU] via SUBCUTANEOUS

## 2018-03-13 MED ORDER — INSULIN STARTER KIT- SYRINGES (ENGLISH)
1.0000 | Freq: Once | Status: AC
Start: 2018-03-13 — End: 2018-03-13
  Administered 2018-03-13: 1
  Filled 2018-03-13: qty 1

## 2018-03-13 MED ORDER — ACETAMINOPHEN 650 MG RE SUPP: 650.0000 mg | Freq: Four times a day (QID) | RECTAL | Status: DC | PRN

## 2018-03-13 MED ORDER — HYDROCODONE-ACETAMINOPHEN 5-325 MG PO TABS
1.0000 | ORAL_TABLET | ORAL | Status: DC | PRN
  Administered 2018-03-13 (×2): 2 via ORAL
  Administered 2018-03-13: 1 via ORAL
  Filled 2018-03-13: qty 1
  Filled 2018-03-13 (×2): qty 2

## 2018-03-13 MED ORDER — ACETAMINOPHEN 325 MG PO TABS
650.0000 mg | ORAL_TABLET | Freq: Four times a day (QID) | ORAL | Status: DC | PRN
  Administered 2018-03-14: 650 mg via ORAL
  Filled 2018-03-13: qty 2

## 2018-03-13 MED ORDER — FUROSEMIDE 40 MG PO TABS
40.0000 mg | ORAL_TABLET | Freq: Two times a day (BID) | ORAL | Status: DC
  Administered 2018-03-13 – 2018-03-15 (×5): 40 mg via ORAL
  Filled 2018-03-13 (×5): qty 1

## 2018-03-13 MED ORDER — SODIUM CHLORIDE 0.9 % IV SOLN: 250.0000 mL | INTRAVENOUS | Status: DC | PRN

## 2018-03-13 MED ORDER — LISINOPRIL 5 MG PO TABS
5.0000 mg | ORAL_TABLET | Freq: Every day | ORAL | Status: DC
  Administered 2018-03-13: 5 mg via ORAL
  Filled 2018-03-13: qty 1

## 2018-03-13 MED ORDER — LISINOPRIL 10 MG PO TABS
10.0000 mg | ORAL_TABLET | Freq: Every day | ORAL | Status: DC
  Administered 2018-03-14: 10 mg via ORAL
  Filled 2018-03-13: qty 1

## 2018-03-13 MED ORDER — MAGNESIUM SULFATE 2 GM/50ML IV SOLN
2.0000 g | Freq: Once | INTRAVENOUS | Status: AC
  Administered 2018-03-13: 2 g via INTRAVENOUS
  Filled 2018-03-13: qty 50

## 2018-03-13 MED ORDER — METOPROLOL TARTRATE 50 MG PO TABS
50.0000 mg | ORAL_TABLET | Freq: Two times a day (BID) | ORAL | Status: DC
  Administered 2018-03-13 – 2018-03-14 (×2): 50 mg via ORAL
  Filled 2018-03-13 (×2): qty 1

## 2018-03-13 MED ORDER — ONDANSETRON HCL 4 MG/2ML IJ SOLN: 4.0000 mg | Freq: Four times a day (QID) | INTRAMUSCULAR | Status: DC | PRN

## 2018-03-13 MED ORDER — INSULIN ASPART 100 UNIT/ML ~~LOC~~ SOLN
0.0000 [IU] | Freq: Every day | SUBCUTANEOUS | Status: DC
  Administered 2018-03-13: 3 [IU] via SUBCUTANEOUS
  Administered 2018-03-14: 2 [IU] via SUBCUTANEOUS

## 2018-03-13 MED ORDER — HYDROXYCHLOROQUINE SULFATE 200 MG PO TABS
200.0000 mg | ORAL_TABLET | Freq: Two times a day (BID) | ORAL | Status: DC
  Administered 2018-03-13 – 2018-03-15 (×6): 200 mg via ORAL
  Filled 2018-03-13 (×6): qty 1

## 2018-03-13 MED ORDER — FUROSEMIDE 10 MG/ML IJ SOLN
40.0000 mg | Freq: Two times a day (BID) | INTRAMUSCULAR | Status: DC
  Administered 2018-03-13: 40 mg via INTRAVENOUS
  Filled 2018-03-13: qty 4

## 2018-03-13 MED ORDER — METHYLPREDNISOLONE SODIUM SUCC 40 MG IJ SOLR
40.0000 mg | Freq: Two times a day (BID) | INTRAMUSCULAR | Status: DC
  Administered 2018-03-13 – 2018-03-14 (×3): 40 mg via INTRAVENOUS
  Filled 2018-03-13 (×3): qty 1

## 2018-03-13 MED ORDER — ALBUTEROL SULFATE (2.5 MG/3ML) 0.083% IN NEBU: 2.5000 mg | INHALATION_SOLUTION | RESPIRATORY_TRACT | Status: DC | PRN

## 2018-03-13 MED ORDER — METOPROLOL TARTRATE 50 MG PO TABS
50.0000 mg | ORAL_TABLET | Freq: Two times a day (BID) | ORAL | Status: DC
  Administered 2018-03-13 (×2): 50 mg via ORAL
  Filled 2018-03-13 (×2): qty 1

## 2018-03-13 NOTE — Progress Notes (Addendum)
Inpatient Diabetes Program Recommendations  AACE/ADA: New Consensus Statement on Inpatient Glycemic Control (2015)  Target Ranges:  Prepandial:   less than 140 mg/dL      Peak postprandial:   less than 180 mg/dL (1-2 hours)      Critically ill patients:  140 - 180 mg/dL   Lab Results  Component Value Date   GLUCAP 292 (H) 03/13/2018   HGBA1C 9.1 (H) 03/13/2018    Review of Glycemic ControlResults for Rackley, Val L (MRN [id number]) as of 03/13/2018 11:39  Ref. Range 03/13/2018 01:36 03/13/2018 07:31  Glucose-Capillary Latest Ref Range: 70 - 99 mg/dL 223 (H) 292 (H)    Diabetes history: Type 2 DM  Outpatient Diabetes medications: Metformin 1000 mg bid, Amaryl 2 mg daily Current orders for Inpatient glycemic control:  Novolog moderate tid with meal and HS Solumedrol 40 mg IV q 12 hours Inpatient Diabetes Program Recommendations:   Please consider adding Lantus 20 units daily.  Also consider adding Novolog 4 units tid with meals (hold if patient eats less than 50%). A1C has increased.  Will attempt to talk to patient today.  Thanks  Jenny , RN, BC-ADM Inpatient Diabetes Coordinator Pager [phone] (8a-5p)  3:15 pm- Spoke with patient regarding elevated A1C and explained that blood sugar average would be approximately 213 mg/dL.  She states that she was recently started on "Prednisone" which could explain increased blood sugar levels. We discussed goal blood sugar values.  Patient states that she was checking blood sugars but only once a day in the mornings.  She states that blood sugars ranged from 100-130 mg/dL in the mornings.  I explained that with Prednisone, blood sugars often rise thought out the day.  Encouraged her to check evening blood sugars as well.  Explained that when she is on steroids, she may need insulin.  Will order insulin starter kit/teaching just in case insulin is needed at d/c.  She currently gets medications from the [facility].  Encouraged her to f/u with PCP.   She has appointment scheduled for August.     

## 2018-03-13 NOTE — Care Management Note (Signed)
Case Management Note  Patient Details  Name: STELLAH DONOVAN MRN: [id number] Date of Birth: 11/26/1975  Subjective/Objective:   Hypertension                Action/Plan: Patient lives at home with family; PCP: Gildardo Pounds, NP; patient goes to the [facility] for medical care; CM will continue to follow for progression of care.  Expected Discharge Date:   possibly 03/16/2018               Expected Discharge Plan:  Home/Self Care  In-House Referral:    Financial Counselor Discharge planning Services  CM Consult    Status of Service:  In process, will continue to follow  Sherrilyn Rist [phone] 03/13/2018, 10:39 AM

## 2018-03-13 NOTE — Progress Notes (Signed)
PROGRESS NOTE  [name]  SWF:[id number] DOB: 1976-07-06 DOA: 03/12/2018 PCP: Gildardo Pounds, NP   Brief Narrative:  [name] is a 42 y.o. female with a past medical history significant for Lupus, chronic diastolic CHF, diabetes mellitus type 2, history of pericarditis with effusion, and anemia. She presented to the emergency department on 7/31 complaining of a 1 week history of worsening DOE, orthopnea, cough, LE edema, 6 pound weight gain and intermittent chest discomfort. She was recently hospitalized in June 2019 with similar symptoms and was found to have lupus pericarditis. She was diuresed and discharged with Lasix and a prednisone taper (down to 80m at time of admission).  In the ED, she was given 40 mg IV Lasix and 1240mIV Solumedrol. CXR showed vascular congestion with small pleural effusion. Admitted to the hospital for further evaluation and treatment.  Subjective: Patient reports that her SOB and orthopnea have much improved since last night. Her LE edema is also much improved and she is no longer exceedingly uncomfortable. Her chest discomfort has also improved, though is still present. She is complaining of some back pain that feels muscular in nature - she will try a heat pack and see if this provides any relief.   Assessment & Plan:  Acute on Chronic Diastolic CHF - Appreciate cardiology consult. Repeat echo from 8/1 showed LVEF of 5522-02%grade 1 diastolic dysfunction, LA severely dilated. Moderate circumferential pericardial effusion (no significant change from 5/19 and 6/19). - She is diuresing well with 2 L urine output since admission. Plan to transition to PO Lasix tomorrow. - Daily weights. Strict I and Os. Monitor daily renal function with BMP.  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 - Chest pain likely secondary to pericarditis. D-Dimer negative.  - Echo results per above. Plan for cardiac MRI  tomorrow. - Continue IV Solumedrol and PO colchicine.  - Norco for pain. - Continue Plaquenil. Needs rheumatologist for lupus management as an outpatient (referred to Dr. DeEstanislado Pandy  Chronic Microcytic Anemia  - Stable with Hb of 12.9 today. Monitor with CBC  Diabetes Mellitus Type 2 - Home medications - metformin and glipizide.  - Sliding Scale Insulin while inpatient. - A1C 9.1%. Diabetes Coordinator recommends Lantus 20 units daily and Novolog 4 units with meals.  - Needs outpatient follow up.   Hypertension  - Control is fair. Continue metoprolol and lisinopril - lisinopril increased to 10 mg daily.   DVT prophylaxis: SCDs Code Status: FULL Family Communication: No family at bedside  Disposition Plan: Like 1-2 days, pending clinical improvement and cardiology workup  Consultants:   Cardiology   Procedures:   Echo   Antimicrobials:   None   Objective: Vitals:   03/13/18 0117 03/13/18 0420 03/13/18 0841 03/13/18 1228  BP: (!) 186/118 (!) 158/102 (!) 150/80 (!) 156/97  Pulse: 87 84  84  Resp: _0 Temp: 98.9 F (37.2 C) 98.4 F (36.9 C)    TempSrc: Oral Oral    SpO2: 100% 96%  97%  Weight: 100.7 kg (221 lb 14.4 oz)     Height: 5' (1.524 m)       Intake/Output Summary (Last 24 hours) at 03/13/2018 1258 Last data filed at 03/13/2018 [id number] per 24 hour  Intake 240 ml  Output 1600 ml  Net -1360 ml   Filed Weights   03/12/18 1814 03/13/18 0117  Weight: 99.8 kg (220 lb) 100.7 kg (221 lb 14.4  oz)   Examination: General appearance: obese adult female, awake and alert. NAD.   HEENT: Anicteric, conjunctiva pink, lids and lashes normal. No nasal deformity, discharge, epistaxis.  Lips moist. Skin: Warm and dry.  No jaundice. No suspicious rashes or lesions. Cardiac: RRR, nl J6-O1, systolic murmer (2/6) appreciated at RUSB. No friction rub noted. No JVD. Trace LE edema bilaterally. Chest pain is non-reproducible. No cyanosis. Distal pulses are intact  bilaterally. Respiratory: Normal respiratory rate and rhythm. CTAB without wheezes, crackles or rales. Abdomen: Abdomen soft and non-distended. No TTP. No ascites, masses, hepatosplenomegaly appreciated. Positive bowel sounds throughout. Neuro: AOx3. Moves all extremities. Speech fluent.    Psych: Sensorium intact and responding to questions, attention normal. Affect normal. Judgment and insight appear normal.  Data Reviewed: I have personally reviewed following labs and imaging studies:  CBC: Recent Labs  Lab 03/12/18 1828 03/13/18 0312  WBC 6.0 7.8  HGB 12.2 12.9  HCT 38.2 40.3  MCV 77.8* 77.5*  PLT 338 157   Basic Metabolic Panel: Recent Labs  Lab 03/12/18 1828 03/13/18 0312  NA 139 138  K 3.5 4.0  CL 103 101  CO2 26 24  GLUCOSE 182* 258*  BUN 8 13  CREATININE 0.71 0.64  CALCIUM 8.7* 9.0  MG  --  1.3*  PHOS  --  4.2   GFR: Estimated Creatinine Clearance: 97.8 mL/min (by C-G formula based on SCr of 0.64 mg/dL). Liver Function Tests: Recent Labs  Lab 03/13/18 0312  AST 18  ALT 13  ALKPHOS 59  BILITOT 0.6  PROT 8.2*  ALBUMIN 3.5   Cardiac Enzymes: Recent Labs  Lab 03/13/18 0004 03/13/18 0312 03/13/18 0952  TROPONINI 0.05* 0.04* 0.03*   HbA1C: Recent Labs    03/13/18 0218  HGBA1C 9.1*   CBG: Recent Labs  Lab 03/13/18 0136 03/13/18 0731 03/13/18 1212  GLUCAP 223* 292* 266*   Thyroid Function Tests: Recent Labs    03/13/18 0218  TSH 1.041   Urine analysis:    Component Value Date/Time   COLORURINE YELLOW 01/02/2017 [place] 01/02/2017 1433   LABSPEC 1.020 07/19/2017 1317   PHURINE 7.0 07/19/2017 1317   GLUCOSEU 100 (A) 07/19/2017 1317   HGBUR MODERATE (A) 07/19/2017 1317   BILIRUBINUR NEGATIVE 07/19/2017 1317   KETONESUR NEGATIVE 07/19/2017 1317   PROTEINUR 30 (A) 07/19/2017 1317   UROBILINOGEN 1.0 07/19/2017 1317   NITRITE NEGATIVE 07/19/2017 1317   LEUKOCYTESUR SMALL (A) 07/19/2017 1317   Radiology  Studies: Dg Chest 2 View  Result Date: 03/12/2018 CLINICAL DATA:  Chest and back pain EXAM: CHEST - 2 VIEW COMPARISON:  01/16/2018, 12/24/2017, 12/16/2017 FINDINGS: Cardiomegaly with vascular congestion. Tiny pleural effusions. No focal airspace disease or pneumothorax. IMPRESSION: Cardiomegaly with vascular congestion and tiny pleural effusions. Electronically Signed   By: Donavan Foil M.D.   On: 03/12/2018 19:47   Scheduled Meds: . colchicine  0.6 mg Oral BID  . furosemide  40 mg Oral BID  . hydroxychloroquine  200 mg Oral BID  . insulin aspart  0-15 Units Subcutaneous TID WC  . insulin aspart  0-5 Units Subcutaneous QHS  . [START ON 03/14/2018] lisinopril  10 mg Oral Daily  . methylPREDNISolone (SOLU-MEDROL) injection  40 mg Intravenous Q12H  . metoprolol tartrate  50 mg Oral BID  . pantoprazole  40 mg Oral Daily  . sodium chloride flush  3 mL Intravenous Q12H   Continuous Infusions: . sodium chloride      LOS: 0 days  Horrace Hanak, PA-S [facility] 03/13/2018, 12:58 PM   [email or web address] Password [email or web address] If 7PM-7AM, please contact night-coverage

## 2018-03-13 NOTE — Progress Notes (Signed)
Paged NP Baltazar Najjar of patient's elevated BP. Awaiting return call or orders. Will continue to assess for patient safety

## 2018-03-13 NOTE — Progress Notes (Signed)
NURSING PROGRESS NOTE  [name] [id number] Admission Data: 03/13/2018 6:44 AM Attending Provider: Edwin Dada, * NRW:CHJSCBI, Vernia Buff, NP Code Status: full  [name] is a 42 y.o. female patient admitted from ED:  -No acute distress noted.  -No complaints of shortness of breath.  -No complaints of chest pain.   Cardiac Monitoring: Box # 25 in place. Cardiac monitor yields:normal sinus rhythm.  Blood pressure (!) 158/102, pulse 84, temperature 98.4 F (36.9 C), temperature source Oral, resp. rate 20, height 5' (1.524 m), weight 100.7 kg (221 lb 14.4 oz), SpO2 96 %.   IV Fluids:  IV in place, occlusive dsg intact without redness, IV cath antecubital right, condition patent and no redness none.   Allergies:  Septra [bactrim]  Past Medical History:   has a past medical history of Abnormal Pap smear of cervix, Allergy, Anemia, CHF (congestive heart failure) ([place]), Depression, Enlarged heart, Fibroid, Gestational diabetes, Heart murmur, Hypertension, Infection, and Lupus ([place]) (1999).  Past Surgical History:   has a past surgical history that includes Therapeutic abortion.  Social History:   reports that she has never smoked. She has never used smokeless tobacco. She reports that she drinks alcohol. She reports that she does not use drugs.  Skin: intact  Patient/Family orientated to room. Information packet given to patient/family. Admission inpatient armband information verified with patient/family to include name and date of birth and placed on patient arm. Side rails up x 2, fall assessment and education completed with patient/family. Patient/family able to verbalize understanding of risk associated with falls and verbalized understanding to call for assistance before getting out of bed. Call light within reach. Patient/family able to voice and demonstrate understanding of unit orientation instructions.    Will continue to evaluate and treat per MD  orders.

## 2018-03-13 NOTE — Consult Note (Signed)
Reason for Consult: Shortness of breath Referring Physician: [facility]  [name] is an 42 y.o. female.  HPI:    42 year old African American female with hypertension, morbid obesity, lupus, recurrent moderate pericardial effusion since 12/2017.  Patient has been on tapering doses of prednisone through her PCP at community and when the center, as well as colchicine 0.6 mg twice daily since 01/2018.  Patient had been doing well until last week.  She started noticing increased rash on both her hands, followed by leg edema and shortness breath.  She also reported sharp pains across her chest and in her back. Workup so far showed chest x-ray with cardiomegaly and vascular congestion.  BNP and troponin mildly elevated.  ESR elevated at 41.  Workup on previous admission showed positive ANA. Titer not reported.   Past Medical History:  Diagnosis Date  . Abnormal Pap smear of cervix    colpo, HPV  . Allergy   . Anemia   . CHF (congestive heart failure) ([place])   . Depression    after losses  . Enlarged heart    managed by cardiology  . Fibroid   . Gestational diabetes   . Heart murmur   . Hypertension   . Infection    UTI  . Lupus ([place]) 1999    Past Surgical History:  Procedure Laterality Date  . THERAPEUTIC ABORTION      Family History  Problem Relation Age of Onset  . Cancer Maternal Grandfather        lung  . Diabetes Mother   . Hypertension Maternal Grandmother   . Diabetes Father   . Hearing loss Neg Hx     Social History:  reports that she has never smoked. She has never used smokeless tobacco. She reports that she drinks alcohol. She reports that she does not use drugs.  Allergies:  Allergies  Allergen Reactions  . Septra [Bactrim] Hives    Medications: I have reviewed the patient's current medications.  Results for orders placed or performed during the hospital encounter of 03/12/18 (from the past 48 hour(s))  D-dimer, quantitative (not at [facility])      Status: None   Collection Time: 03/12/18  3:16 PM  Result Value Ref Range   D-Dimer, Quant <0.27 0.00 - 0.50 ug/mL-FEU    Comment: (NOTE) At the manufacturer cut-off of 0.50 ug/mL FEU, this assay has been documented to exclude PE with a sensitivity and negative predictive value of 97 to 99%.  At this time, this assay has not been approved by the FDA to exclude DVT/VTE. Results should be correlated with clinical presentation. Performed at [facility], [place] [address] [place], [place] [address]   Basic metabolic panel     Status: Abnormal   Collection Time: 03/12/18  6:28 PM  Result Value Ref Range   Sodium 139 135 - 145 mmol/L   Potassium 3.5 3.5 - 5.1 mmol/L   Chloride 103 98 - 111 mmol/L   CO2 26 22 - 32 mmol/L   Glucose, Bld 182 (H) 70 - 99 mg/dL   BUN 8 6 - 20 mg/dL   Creatinine, Ser 0.71 0.44 - 1.00 mg/dL   Calcium 8.7 (L) 8.9 - 10.3 mg/dL   GFR calc non Af Amer >60 >60 mL/min   GFR calc Af Amer >60 >60 mL/min    Comment: (NOTE) The eGFR has been calculated using the CKD EPI equation. This calculation has not been validated in all clinical situations. eGFR's persistently <  60 mL/min signify possible Chronic Kidney Disease.    Anion gap 10 5 - 15    Comment: Performed at [facility] [address], [place], [place] [address]  CBC     Status: Abnormal   Collection Time: 03/12/18  6:28 PM  Result Value Ref Range   WBC 6.0 4.0 - 10.5 K/uL   RBC 4.91 3.87 - 5.11 MIL/uL   Hemoglobin 12.2 12.0 - 15.0 g/dL   HCT 38.2 36.0 - 46.0 %   MCV 77.8 (L) 78.0 - 100.0 fL   MCH 24.8 (L) 26.0 - 34.0 pg   MCHC 31.9 30.0 - 36.0 g/dL   RDW 15.0 11.5 - 15.5 %   Platelets 338 150 - 400 K/uL    Comment: Performed at [facility], [place] [address], [place], [place] [address]  I-stat troponin, ED     Status: None   Collection Time: 03/12/18  6:33 PM  Result Value Ref Range   Troponin i, poc 0.07 0.00 - 0.08 ng/mL   Comment 3            Comment: Due to the release  kinetics of cTnI, a negative result within the first hours of the onset of symptoms does not rule out myocardial infarction with certainty. If myocardial infarction is still suspected, repeat the test at appropriate intervals.   I-Stat beta hCG blood, ED     Status: None   Collection Time: 03/12/18  6:33 PM  Result Value Ref Range   I-stat hCG, quantitative <5.0 <5 mIU/mL   Comment 3            Comment:   GEST. AGE      CONC.  (mIU/mL)   <=1 WEEK        5 - 50     2 WEEKS       50 - 500     3 WEEKS       100 - 10,000     4 WEEKS     1,000 - 30,000        FEMALE AND NON-PREGNANT FEMALE:     LESS THAN 5 mIU/mL   Brain natriuretic peptide     Status: Abnormal   Collection Time: 03/12/18  9:55 PM  Result Value Ref Range   B Natriuretic Peptide 180.7 (H) 0.0 - 100.0 pg/mL    Comment: Performed at [facility] [address], [place], [place] [address]  C-reactive protein     Status: None   Collection Time: 03/13/18 12:04 AM  Result Value Ref Range   CRP 0.9 <1.0 mg/dL    Comment: Performed at [facility], [place] [address] [place], [place] [address]  Sedimentation rate     Status: Abnormal   Collection Time: 03/13/18 12:04 AM  Result Value Ref Range   Sed Rate 41 (H) 0 - 22 mm/hr    Comment: Performed at [facility] [address], [place], [place] [address]  Troponin I (q 6hr x 3)     Status: Abnormal   Collection Time: 03/13/18 12:04 AM  Result Value Ref Range   Troponin I 0.05 (HH) <0.03 ng/mL    Comment: CRITICAL RESULT CALLED TO, READ BACK BY AND VERIFIED WITH: HART D,RN 03/13/18 0123 WAYK Performed at [facility], [place] [address] [place], [place] [address]   Glucose, capillary     Status: Abnormal   Collection Time: 03/13/18  1:36 AM  Result Value Ref Range   Glucose-Capillary 223 (H) 70 - 99 mg/dL   Comment 1 Notify RN    Comment 2 Document in Chart   D-dimer, quantitative (not at [facility])     Status: None   Collection Time: 03/13/18  2:18 AM   Result Value Ref Range   D-Dimer, Quant <0.27 0.00 - 0.50 ug/mL-FEU    Comment: (NOTE) At the manufacturer cut-off of 0.50 ug/mL FEU, this assay has been documented to exclude PE with a sensitivity and negative predictive value of 97 to 99%.  At this time, this assay has not been approved by the FDA to exclude DVT/VTE. Results should be correlated with clinical presentation. Performed at [facility], [place] [address], [place], [place] [address]   Brain natriuretic peptide     Status: Abnormal   Collection Time: 03/13/18  2:18 AM  Result Value Ref Range   B Natriuretic Peptide 229.2 (H) 0.0 - 100.0 pg/mL    Comment: Performed at [facility] [address] [place], [place] [address]  TSH     Status: None   Collection Time: 03/13/18  2:18 AM  Result Value Ref Range   TSH 1.041 0.350 - 4.500 uIU/mL    Comment: Performed by a 3rd Generation assay with a functional sensitivity of <=0.01 uIU/mL. Performed at [facility], [place] [address], [place], [place] [address]   Hemoglobin A1c     Status: Abnormal   Collection Time: 03/13/18  2:18 AM  Result Value Ref Range   Hgb A1c MFr Bld 9.1 (H) 4.8 - 5.6 %    Comment: (NOTE) Pre diabetes:          5.7%-6.4% Diabetes:              >6.4% Glycemic control for   <7.0% adults with diabetes    Mean Plasma Glucose 214.47 mg/dL    Comment: Performed at [facility] [address] [place], [place] [address]  Troponin I (q 6hr x 3)     Status: Abnormal   Collection Time: 03/13/18  3:12 AM  Result Value Ref Range   Troponin I 0.04 (HH) <0.03 ng/mL    Comment: CRITICAL VALUE NOTED.  VALUE IS CONSISTENT WITH PREVIOUSLY REPORTED AND CALLED VALUE. Performed at [facility], [place] [address], [place], [place] [address]   Magnesium     Status: Abnormal   Collection Time: 03/13/18  3:12 AM  Result Value Ref Range   Magnesium 1.3 (L) 1.7 - 2.4 mg/dL    Comment: Performed at [facility] [address]  [place], [place] [address]  Phosphorus     Status: None   Collection Time: 03/13/18  3:12 AM  Result Value Ref Range   Phosphorus 4.2 2.5 - 4.6 mg/dL    Comment: Performed at [facility] [address], [place], [place] [address]  Comprehensive metabolic panel     Status: Abnormal   Collection Time: 03/13/18  3:12 AM  Result Value Ref Range   Sodium 138 135 - 145 mmol/L   Potassium 4.0 3.5 - 5.1 mmol/L   Chloride 101 98 - 111 mmol/L   CO2 24 22 - 32 mmol/L   Glucose, Bld 258 (H) 70 - 99 mg/dL   BUN 13 6 - 20 mg/dL   Creatinine, Ser 0.64 0.44 - 1.00 mg/dL   Calcium 9.0 8.9 - 10.3 mg/dL   Total Protein 8.2 (H) 6.5 -  8.1 g/dL   Albumin 3.5 3.5 - 5.0 g/dL   AST 18 15 - 41 U/L   ALT 13 0 - 44 U/L   Alkaline Phosphatase 59 38 - 126 U/L   Total Bilirubin 0.6 0.3 - 1.2 mg/dL   GFR calc non Af Amer >60 >60 mL/min   GFR calc Af Amer >60 >60 mL/min    Comment: (NOTE) The eGFR has been calculated using the CKD EPI equation. This calculation has not been validated in all clinical situations. eGFR's persistently <60 mL/min signify possible Chronic Kidney Disease.    Anion gap 13 5 - 15    Comment: Performed at [facility] [address] [place], [place] [address]  CBC     Status: Abnormal   Collection Time: 03/13/18  3:12 AM  Result Value Ref Range   WBC 7.8 4.0 - 10.5 K/uL   RBC 5.20 (H) 3.87 - 5.11 MIL/uL   Hemoglobin 12.9 12.0 - 15.0 g/dL   HCT 40.3 36.0 - 46.0 %   MCV 77.5 (L) 78.0 - 100.0 fL   MCH 24.8 (L) 26.0 - 34.0 pg   MCHC 32.0 30.0 - 36.0 g/dL   RDW 14.8 11.5 - 15.5 %   Platelets 291 150 - 400 K/uL    Comment: Performed at [facility], [place] [address] [place], [place] [address]  Glucose, capillary     Status: Abnormal   Collection Time: 03/13/18  7:31 AM  Result Value Ref Range   Glucose-Capillary 292 (H) 70 - 99 mg/dL    Dg Chest 2 View  Result Date: 03/12/2018 CLINICAL DATA:  Chest and back pain EXAM: CHEST - 2 VIEW COMPARISON:  01/16/2018,  12/24/2017, 12/16/2017 FINDINGS: Cardiomegaly with vascular congestion. Tiny pleural effusions. No focal airspace disease or pneumothorax. IMPRESSION: Cardiomegaly with vascular congestion and tiny pleural effusions. Electronically Signed   By: Donavan Foil M.D.   On: 03/12/2018 19:47    ROS  Review of Systems  Constitutional: Positive for malaise/fatigue. Negative for chills and fever.  HENT: Negative.   Respiratory: Positive for shortness of breath. Negative for cough and sputum production.   Cardiovascular: Positive for chest pain (Pleuritic), palpitations and leg swelling.  Gastrointestinal: Negative for nausea and vomiting.  Genitourinary: Negative.   Musculoskeletal: Negative.   Skin: Rash on both hands Neurological: Negative for dizziness and loss of consciousness.  Endo/Heme/Allergies: Negative.   Psychiatric/Behavioral: Negative.   All other systems reviewed and are negative.    Blood pressure (!) 150/80, pulse 84, temperature 98.4 F (36.9 C), temperature source Oral, resp. rate 20, height 5' (1.524 m), weight 100.7 kg (221 lb 14.4 oz), SpO2 96 %. Physical Exam  Nursing note and vitals reviewed. Constitutional: She is oriented to person, place, and time. She appears well-developed and well-nourished. No distress.  Morbidly obese  HENT:  Head: Normocephalic and atraumatic.  Eyes: Pupils are equal, round, and reactive to light. Conjunctivae are normal.  Neck: No JVD present.  Cardiovascular: Normal rate and regular rhythm. Exam reveals no gallop and no friction rub.  Murmur (II/VI crescndo murmur RUSB) heard. Respiratory: Effort normal and breath sounds normal. She has no wheezes. She has no rales.  GI: Soft. Bowel sounds are normal.  Musculoskeletal: She exhibits edema (trace b/l).  Neurological: She is alert and oriented to person, place, and time. No cranial nerve deficit.  Skin: Skin is warm and dry.  Psychiatric: She has a normal mood and affect.      Echocardiogram  01/17/2018: Study Conclusions  - Left ventricle: Systolic function was normal. The estimated ejection fraction was in the range of 60% to 65%. - Aortic valve: Mild thickening of aortic valve. Mean PG 19 mmHg. Vmax 3 m/sec. AVA 1.4 cm2. Dimensionless index 0.47. Mild aortic stenosis. - Right atrium: Central venous pressure (est): 3 mm Hg. - Pericardium, extracardiac: Circumferential pericardial effusion with largest collection posterior to the left ventricle (3.1 cm) and adjacent to right atrium (2.3 cm). There is minimal effusion in other locations. No signs of RA/RV collapse/ hemodynamic compromise. No increase in pericardial effusion compared to recent inpatient and outpatient echocardiogram in 12/2017.  Study Conclusions  - Left ventricle: The cavity size was normal. There was moderate concentric hypertrophy. Systolic function was vigorous. The estimated ejection fraction was in the range of 65% to 70%. Wall motion was normal; there were no regional wall motion abnormalities. Features are consistent with a pseudonormal left ventricular filling pattern, with concomitant abnormal relaxation and increased filling pressure (grade 2 diastolic dysfunction). Doppler parameters are consistent with high ventricular filling pressure. - Aortic valve: There was very mild stenosis. Mean gradient (S): 10 mm Hg. Valve area (VTI): 2.11 cm^2. Valve area (Vmax): 2.26 cm^2. Valve area (Vmean): 1.98 cm^2. - Mitral valve: There was trivial regurgitation. - Left atrium: The atrium was severely dilated. - Tricuspid valve: There was trivial regurgitation. - Inferior vena cava: The vessel was mildly dilated. The respirophasic diameter changes were in the normal range (>= 50%). - Pericardium, extracardiac: A moderate, free-flowing pericardial effusion 2.22 x 1.81cm that was identified circumferential to the heart. The fluid had no  internal echoes. There was no chamber collapse. There was evidence for mildly increased RV-LV interaction demonstrated by respirophasic changes in transmitral velocities.  EKG 03/12/2018: Sinus tachycardia 100 bpm.  Rightward axis.  PACs with aberrant conduction.  T-wave inversions lateral leads, cannot exclude ischemia..    Assessment/Recommendations: 42 y/o [place] American female w/lupus, hypertension, moderate obesity, with shortness of breath, stable moderate pericardial effusion  Shortness of breath: Likely multifactorial. Chest Xray does not show significant pulmonary edema. She again has relatively stable pericardial effusion with no JVD/ IVC dilatation. No tamponade physiology seen. Effusive-constrictive pericarditis less likely. Mild troponin and BNP elevation. ESR elevated.   Suspecting myopericarditis, I recommend cardiac MRI for further delineation. Given her hemodynamic stability, body habitus, relatively posterior location of moderate size pericardial effusion, I do not think she will benefit from attempts of pericardiocentesis.  She needs aggressive management of her lupus.  Continue colchicine 0.6 mg twice daily.  Consider steroid burst.  Ideally, she needs baseline steroid sparing therapy.  I will refer her to outpatient rheumatology evaluation with Dr. Estanislado Pandy.   Can stop IV Lasix.  Switch back to oral p.o. Lasix.  Hypertension: Remains suboptimal. On lisinopril 5 mg daily, metoprolol 50 mg bid.  Increase lisinopril to 10 mg daily.  DM: Management per PCP.     [name] 03/13/2018, 10:16 AM   [place], MD [facility] Pager: [phone] Office: [phone] If no answer Cell [phone]

## 2018-03-13 NOTE — Progress Notes (Signed)
PROGRESS NOTE    [name]  GYI:[id number] DOB: [birth year] DOA: 03/12/2018 PCP: Gildardo Pounds, NP      Brief Narrative:  [name] is a 42 y.o. female with a past medical history significant for Lupus, chronic diastolic CHF, diabetes mellitus type 2, history of pericarditis with effusion, and anemia. She presented to the emergency department on 7/31 complaining of a 1 week history of worsening DOE, orthopnea, cough, LE edema, 6 pound weight gain and intermittent chest discomfort.     Assessment & Plan:  Acute lupus pericarditis -Continue colchicine, Solu-Medrol -PPI -Consult to Cardiology, appreciate cares   Systemic lupus erythematosus -Continue Plaquenil -Follow-up ANCA, ANA, rheumatoid factor, anti-Jo, ENA  Acute on chronic diastolic CHF  hypertension -Continue oral furosemide -Continue BB, lisinopril -Monitor K -Check daily weights, daily BMP  Microcytic anemia Stable relative to baseline, 12.9  Diabetes TSH normal hemoglobin A1c up to 9%, likely from steroids recently. -Continue high dose SSI -Monitor glucoses, low threshold to start Glargine  Hypomagnesemia -Supplement magnesium      DVT prophylaxis: SCDs Code Status: FULL Family Communication: None present MDM and disposition Plan: The below labs and imaging reports were reviewed and summarized above.    The patient was admitted with chest pain and dyspnea on exertion.    We will obtain echo and MRI of the heart.  She has been started on IV steroids.  Once it is clear that her pericarditis is responding to colchicine and steroids, will transition home.     Consultants:   Cardiology  Procedures:   Echo  Cardiac MRI  Antimicrobials:   None    Subjective: Patient's chest pain is somewhat better.  She is improved dyspnea with exertion.  No fever, chills, cough, sputum.  Objective: Vitals:   03/13/18 0117 03/13/18 0420 03/13/18 0841 03/13/18 1228  BP: (!) 186/118 (!)  158/102 (!) 150/80 (!) 156/97  Pulse: 87 84  84  Resp: _0 Temp: 98.9 F (37.2 C) 98.4 F (36.9 C)    TempSrc: Oral Oral    SpO2: 100% 96%  97%  Weight: 100.7 kg (221 lb 14.4 oz)     Height: 5' (1.524 m)       Intake/Output Summary (Last 24 hours) at 03/13/2018 1607 Last data filed at 03/13/2018 1300 Gross per 24 hour  Intake 240 ml  Output 2000 ml  Net -1760 ml   Filed Weights   03/12/18 1814 03/13/18 0117  Weight: 99.8 kg (220 lb) 100.7 kg (221 lb 14.4 oz)    Examination: General appearance: Obese adult female, alert and in no acute distress.   HEENT: Anicteric, conjunctiva pink, lids and lashes normal. No nasal deformity, discharge, epistaxis.  Lips moist teeth normal, oropharynx moist, no oral lesions, hearing normal.   Skin: Warm and dry.  No suspicious rashes or lesions. Cardiac: RRR, nl S1-S2, no murmurs appreciated.  Capillary refill is brisk.  JVP not visible.  Trace LE edema.  Radial pulses 2+ and symmetric. Respiratory: Normal respiratory rate and rhythm.  CTAB without rales or wheezes. Abdomen: Abdomen soft.  noi TTP or guarding. No ascites, distension, hepatosplenomegaly.   MSK: No deformities or effusions. Neuro: Awake and alert.  EOMI, moves all extremities. Speech fluent.    Psych: Sensorium intact and responding to questions, attention normal. Affect normal.  Judgment and insight appear normal.    Data Reviewed: I have personally reviewed following labs and imaging studies:  CBC: Recent Labs  Lab 03/12/18 1828 03/13/18  0312  WBC 6.0 7.8  HGB 12.2 12.9  HCT 38.2 40.3  MCV 77.8* 77.5*  PLT 338 098   Basic Metabolic Panel: Recent Labs  Lab 03/12/18 1828 03/13/18 0312  NA 139 138  K 3.5 4.0  CL 103 101  CO2 26 24  GLUCOSE 182* 258*  BUN 8 13  CREATININE 0.71 0.64  CALCIUM 8.7* 9.0  MG  --  1.3*  PHOS  --  4.2   GFR: Estimated Creatinine Clearance: 97.8 mL/min (by C-G formula based on SCr of 0.64 mg/dL). Liver Function Tests: Recent  Labs  Lab 03/13/18 0312  AST 18  ALT 13  ALKPHOS 59  BILITOT 0.6  PROT 8.2*  ALBUMIN 3.5   No results for input(s): LIPASE, AMYLASE in the last 168 hours. No results for input(s): AMMONIA in the last 168 hours. Coagulation Profile: No results for input(s): INR, PROTIME in the last 168 hours. Cardiac Enzymes: Recent Labs  Lab 03/13/18 0004 03/13/18 0312 03/13/18 0952  TROPONINI 0.05* 0.04* 0.03*   BNP (last 3 results) No results for input(s): PROBNP in the last 8760 hours. HbA1C: Recent Labs    03/13/18 0218  HGBA1C 9.1*   CBG: Recent Labs  Lab 03/13/18 0136 03/13/18 0731 03/13/18 1212  GLUCAP 223* 292* 266*   Lipid Profile: No results for input(s): CHOL, HDL, LDLCALC, TRIG, CHOLHDL, LDLDIRECT in the last 72 hours. Thyroid Function Tests: Recent Labs    03/13/18 0218  TSH 1.041   Anemia Panel: No results for input(s): VITAMINB12, FOLATE, FERRITIN, TIBC, IRON, RETICCTPCT in the last 72 hours. Urine analysis:    Component Value Date/Time   COLORURINE YELLOW 01/02/2017 [place] 01/02/2017 1433   LABSPEC 1.020 07/19/2017 1317   PHURINE 7.0 07/19/2017 1317   GLUCOSEU 100 (A) 07/19/2017 1317   HGBUR MODERATE (A) 07/19/2017 1317   BILIRUBINUR NEGATIVE 07/19/2017 1317   KETONESUR NEGATIVE 07/19/2017 1317   PROTEINUR 30 (A) 07/19/2017 1317   UROBILINOGEN 1.0 07/19/2017 1317   NITRITE NEGATIVE 07/19/2017 1317   LEUKOCYTESUR SMALL (A) 07/19/2017 1317   Sepsis Labs: _0 (procalcitonin:4,lacticacidven:4)  )No results found for this or any previous visit (from the past 240 hour(s)).       Radiology Studies: Dg Chest 2 View  Result Date: 03/12/2018 CLINICAL DATA:  Chest and back pain EXAM: CHEST - 2 VIEW COMPARISON:  01/16/2018, 12/24/2017, 12/16/2017 FINDINGS: Cardiomegaly with vascular congestion. Tiny pleural effusions. No focal airspace disease or pneumothorax. IMPRESSION: Cardiomegaly with vascular congestion and tiny pleural  effusions. Electronically Signed   By: Donavan Foil M.D.   On: 03/12/2018 19:47        Scheduled Meds: . colchicine  0.6 mg Oral BID  . furosemide  40 mg Oral BID  . hydroxychloroquine  200 mg Oral BID  . insulin aspart  0-15 Units Subcutaneous TID WC  . insulin aspart  0-5 Units Subcutaneous QHS  . insulin starter kit- syringes  1 kit Other Once  . [START ON 03/14/2018] lisinopril  10 mg Oral Daily  . methylPREDNISolone (SOLU-MEDROL) injection  40 mg Intravenous Q12H  . metoprolol tartrate  50 mg Oral BID  . pantoprazole  40 mg Oral Daily  . sodium chloride flush  3 mL Intravenous Q12H   Continuous Infusions: . sodium chloride       LOS: 0 days    Time spent: 25 minutes    Edwin Dada, MD [facility] 03/13/2018, 4:07 PM     Pager [phone] --- please  page though AMION:  [email or web address] Password [email or web address] If 7PM-7AM, please contact night-coverage

## 2018-03-13 NOTE — Progress Notes (Signed)
Pt is stable, ambulated in a hallway, CT cardiac and Echo done, appetite good, no complain of pain since morning, vitals stable, will continue to monitor  Palma Holter, RN

## 2018-03-13 NOTE — Progress Notes (Signed)
  Echocardiogram 2D Echocardiogram has been performed.  Melizza Kanode G Jennah Satchell 03/13/2018, 10:33 AM

## 2018-03-14 ENCOUNTER — Encounter (HOSPITAL_COMMUNITY)
Admission: EM | Disposition: A | Discharge status: Home / Self Care | Payer: Self-pay | Source: Home / Self Care | Attending: Family Medicine

## 2018-03-14 HISTORY — PX: RIGHT/LEFT HEART CATH AND CORONARY ANGIOGRAPHY: CATH118266

## 2018-03-14 LAB — POCT I-STAT 3, VENOUS BLOOD GAS (G3P V)
Bicarbonate: 26.4 mmol/L (ref 20.0–28.0)
O2 Saturation: 78 %
TCO2: 28 mmol/L (ref 22–32)
pCO2, Ven: 46.4 mmHg (ref 44.0–60.0)
pH, Ven: 7.363 (ref 7.250–7.430)
pO2, Ven: 44 mmHg (ref 32.0–45.0)

## 2018-03-14 LAB — GLUCOSE, CAPILLARY
Glucose-Capillary: 260 mg/dL — ABNORMAL HIGH (ref 70–99)
Glucose-Capillary: 220 mg/dL — ABNORMAL HIGH (ref 70–99)
Glucose-Capillary: 248 mg/dL — ABNORMAL HIGH (ref 70–99)
Glucose-Capillary: 258 mg/dL — ABNORMAL HIGH (ref 70–99)

## 2018-03-14 LAB — EXTRACTABLE NUCLEAR ANTIGEN ANTIBODY
ENA SM Ab Ser-aCnc: 0.3 AI (ref 0.0–0.9)
Ribonucleic Protein: 6.1 AI — ABNORMAL HIGH (ref 0.0–0.9)
SSA (Ro) (ENA) Antibody, IgG: 0.4 AI (ref 0.0–0.9)
SSB (La) (ENA) Antibody, IgG: <0.2 AI (ref 0.0–0.9)
Scleroderma (Scl-70) (ENA) Antibody, IgG: <0.2 AI (ref 0.0–0.9)
ds DNA Ab: 1 IU/mL (ref 0–9)

## 2018-03-14 LAB — C4 COMPLEMENT: Complement C4, Body Fluid: 22 mg/dL (ref 14–44)

## 2018-03-14 LAB — BASIC METABOLIC PANEL
Anion gap: 9 (ref 5–15)
BUN: 19 mg/dL (ref 6–20)
CO2: 26 mmol/L (ref 22–32)
Calcium: 9.1 mg/dL (ref 8.9–10.3)
Chloride: 99 mmol/L (ref 98–111)
Creatinine, Ser: 0.72 mg/dL (ref 0.44–1.00)
GFR calc Af Amer: >60 mL/min (ref >60)
GFR calc non Af Amer: >60 mL/min (ref >60)
Glucose, Bld: 324 mg/dL — ABNORMAL HIGH (ref 70–99)
Potassium: 4.3 mmol/L (ref 3.5–5.1)
Sodium: 134 mmol/L — ABNORMAL LOW (ref 135–145)

## 2018-03-14 LAB — ANCA TITERS
Atypical P-ANCA titer: <1:20 {titer}
C-ANCA: <1:20 {titer}
P-ANCA: <1:20 {titer}

## 2018-03-14 LAB — ANA W/REFLEX IF POSITIVE: Anti Nuclear Antibody(ANA): POSITIVE — AB

## 2018-03-14 LAB — ENA+DNA/DS+ANTICH+CENTRO+JO...
Anti JO-1: <0.2 AI (ref 0.0–0.9)
Centromere Ab Screen: <0.2 AI (ref 0.0–0.9)
Chromatin Ab SerPl-aCnc: 3.7 AI — ABNORMAL HIGH (ref 0.0–0.9)
ENA SM Ab Ser-aCnc: 0.3 AI (ref 0.0–0.9)
Ribonucleic Protein: 5.9 AI — ABNORMAL HIGH (ref 0.0–0.9)
SSA (Ro) (ENA) Antibody, IgG: 0.4 AI (ref 0.0–0.9)
SSB (La) (ENA) Antibody, IgG: <0.2 AI (ref 0.0–0.9)
Scleroderma (Scl-70) (ENA) Antibody, IgG: <0.2 AI (ref 0.0–0.9)
ds DNA Ab: 1 IU/mL (ref 0–9)

## 2018-03-14 LAB — RHEUMATOID FACTOR: Rhuematoid fact SerPl-aCnc: <10 IU/mL (ref 0.0–13.9)

## 2018-03-14 LAB — CBC
HCT: 37.7 % (ref 36.0–46.0)
Hemoglobin: 12.2 g/dL (ref 12.0–15.0)
MCH: 24.8 pg — ABNORMAL LOW (ref 26.0–34.0)
MCHC: 32.4 g/dL (ref 30.0–36.0)
MCV: 76.6 fL — ABNORMAL LOW (ref 78.0–100.0)
Platelets: 330 10*3/uL (ref 150–400)
RBC: 4.92 MIL/uL (ref 3.87–5.11)
RDW: 14.7 % (ref 11.5–15.5)
WBC: 6.6 10*3/uL (ref 4.0–10.5)

## 2018-03-14 LAB — ANTI-JO 1 ANTIBODY, IGG: Anti JO-1: <0.2 AI (ref 0.0–0.9)

## 2018-03-14 LAB — C3 COMPLEMENT: C3 Complement: 152 mg/dL (ref 82–167)

## 2018-03-14 SURGERY — RIGHT/LEFT HEART CATH AND CORONARY ANGIOGRAPHY
Anesthesia: LOCAL

## 2018-03-14 MED ORDER — ASPIRIN 81 MG PO CHEW
81.0000 mg | CHEWABLE_TABLET | ORAL | Status: AC
  Administered 2018-03-14: 81 mg via ORAL
  Filled 2018-03-14: qty 1

## 2018-03-14 MED ORDER — ACETAMINOPHEN 325 MG PO TABS: 650.0000 mg | ORAL_TABLET | ORAL | Status: DC | PRN

## 2018-03-14 MED ORDER — SODIUM CHLORIDE 0.9% FLUSH
3.0000 mL | Freq: Two times a day (BID) | INTRAVENOUS | Status: DC
  Administered 2018-03-15: 3 mL via INTRAVENOUS

## 2018-03-14 MED ORDER — HEPARIN SODIUM (PORCINE) 1000 UNIT/ML IJ SOLN
INTRAMUSCULAR | Status: DC | PRN
  Administered 2018-03-14: 4000 [IU] via INTRAVENOUS

## 2018-03-14 MED ORDER — LABETALOL HCL 5 MG/ML IV SOLN
INTRAVENOUS | Status: AC
  Filled 2018-03-14: qty 4

## 2018-03-14 MED ORDER — SODIUM CHLORIDE 0.9 % IV SOLN: 250.0000 mL | INTRAVENOUS | Status: DC | PRN

## 2018-03-14 MED ORDER — INSULIN GLARGINE 100 UNIT/ML ~~LOC~~ SOLN
10.0000 [IU] | Freq: Every day | SUBCUTANEOUS | Status: DC
  Administered 2018-03-14: 10 [IU] via SUBCUTANEOUS
  Filled 2018-03-14 (×2): qty 0.1

## 2018-03-14 MED ORDER — ONDANSETRON HCL 4 MG/2ML IJ SOLN: 4.0000 mg | Freq: Four times a day (QID) | INTRAMUSCULAR | Status: DC | PRN

## 2018-03-14 MED ORDER — PREDNISONE 20 MG PO TABS
40.0000 mg | ORAL_TABLET | Freq: Every day | ORAL | Status: DC
  Administered 2018-03-15: 40 mg via ORAL
  Filled 2018-03-14: qty 2

## 2018-03-14 MED ORDER — AMLODIPINE BESYLATE 10 MG PO TABS
10.0000 mg | ORAL_TABLET | Freq: Every day | ORAL | Status: DC
  Administered 2018-03-14 – 2018-03-15 (×2): 10 mg via ORAL
  Filled 2018-03-14 (×2): qty 1

## 2018-03-14 MED ORDER — SODIUM CHLORIDE 0.9% FLUSH: 3.0000 mL | INTRAVENOUS | Status: DC | PRN

## 2018-03-14 MED ORDER — HEPARIN (PORCINE) IN NACL 1000-0.9 UT/500ML-% IV SOLN
INTRAVENOUS | Status: DC | PRN
  Administered 2018-03-14 (×2): 500 mL

## 2018-03-14 MED ORDER — METOPROLOL TARTRATE 50 MG PO TABS
50.0000 mg | ORAL_TABLET | Freq: Two times a day (BID) | ORAL | Status: DC
  Administered 2018-03-14 – 2018-03-15 (×2): 50 mg via ORAL
  Filled 2018-03-14 (×2): qty 1

## 2018-03-14 MED ORDER — SODIUM CHLORIDE 0.9% FLUSH
3.0000 mL | Freq: Two times a day (BID) | INTRAVENOUS | Status: DC
  Administered 2018-03-14: 3 mL via INTRAVENOUS

## 2018-03-14 MED ORDER — HEPARIN SODIUM (PORCINE) 1000 UNIT/ML IJ SOLN
INTRAMUSCULAR | Status: AC
  Filled 2018-03-14: qty 1

## 2018-03-14 MED ORDER — MIDAZOLAM HCL 2 MG/2ML IJ SOLN
INTRAMUSCULAR | Status: AC
  Filled 2018-03-14: qty 2

## 2018-03-14 MED ORDER — FENTANYL CITRATE (PF) 100 MCG/2ML IJ SOLN
INTRAMUSCULAR | Status: AC
  Filled 2018-03-14: qty 2

## 2018-03-14 MED ORDER — LIDOCAINE HCL (PF) 1 % IJ SOLN
INTRAMUSCULAR | Status: AC
  Filled 2018-03-14: qty 30

## 2018-03-14 MED ORDER — SODIUM CHLORIDE 0.9 % IV SOLN
INTRAVENOUS | Status: AC
  Administered 2018-03-14: 12:00:00 via INTRAVENOUS

## 2018-03-14 MED ORDER — IOHEXOL 350 MG/ML SOLN
INTRAVENOUS | Status: DC | PRN
  Administered 2018-03-14: 25 mL via INTRA_ARTERIAL

## 2018-03-14 MED ORDER — LIDOCAINE HCL (PF) 1 % IJ SOLN
INTRAMUSCULAR | Status: DC | PRN
  Administered 2018-03-14 (×2): 2 mL via INTRADERMAL

## 2018-03-14 MED ORDER — FENTANYL CITRATE (PF) 100 MCG/2ML IJ SOLN
INTRAMUSCULAR | Status: DC | PRN
  Administered 2018-03-14: 25 ug via INTRAVENOUS
  Administered 2018-03-14: 50 ug via INTRAVENOUS

## 2018-03-14 MED ORDER — LABETALOL HCL 5 MG/ML IV SOLN
INTRAVENOUS | Status: DC | PRN
  Administered 2018-03-14 (×2): 10 mg via INTRAVENOUS

## 2018-03-14 MED ORDER — VERAPAMIL HCL 2.5 MG/ML IV SOLN
INTRAVENOUS | Status: AC
  Filled 2018-03-14: qty 2

## 2018-03-14 MED ORDER — LISINOPRIL 20 MG PO TABS
20.0000 mg | ORAL_TABLET | Freq: Every day | ORAL | Status: DC
  Administered 2018-03-15: 20 mg via ORAL
  Filled 2018-03-14: qty 1

## 2018-03-14 MED ORDER — ROSUVASTATIN CALCIUM 20 MG PO TABS
20.0000 mg | ORAL_TABLET | Freq: Every day | ORAL | Status: DC
  Administered 2018-03-14: 20 mg via ORAL
  Filled 2018-03-14: qty 1

## 2018-03-14 MED ORDER — MIDAZOLAM HCL 2 MG/2ML IJ SOLN
INTRAMUSCULAR | Status: DC | PRN
  Administered 2018-03-14 (×2): 1 mg via INTRAVENOUS

## 2018-03-14 MED ORDER — HEPARIN (PORCINE) IN NACL 1000-0.9 UT/500ML-% IV SOLN
INTRAVENOUS | Status: AC
  Filled 2018-03-14: qty 1000

## 2018-03-14 MED ORDER — VERAPAMIL HCL 2.5 MG/ML IV SOLN
INTRAVENOUS | Status: DC | PRN
  Administered 2018-03-14: 5 mL via INTRA_ARTERIAL

## 2018-03-14 SURGICAL SUPPLY — 11 items
CATH BALLN WEDGE 5F 110CM (CATHETERS) ×2 IMPLANT
CATH OPTITORQUE TIG 4.0 5F (CATHETERS) ×2 IMPLANT
DEVICE RAD COMP TR BAND LRG (VASCULAR PRODUCTS) ×2 IMPLANT
GLIDESHEATH SLEND A-KIT 6F 22G (SHEATH) ×2 IMPLANT
GUIDEWIRE INQWIRE 1.5J.035X260 (WIRE) ×1 IMPLANT
INQWIRE 1.5J .035X260CM (WIRE) ×2
KIT HEART LEFT (KITS) ×2 IMPLANT
PACK CARDIAC CATHETERIZATION (CUSTOM PROCEDURE TRAY) ×2 IMPLANT
SHEATH GLIDE SLENDER 4/5FR (SHEATH) ×2 IMPLANT
TRANSDUCER W/STOPCOCK (MISCELLANEOUS) ×2 IMPLANT
TUBING CIL FLEX 10 FLL-RA (TUBING) ×2 IMPLANT

## 2018-03-14 NOTE — Progress Notes (Addendum)
Inpatient Diabetes Program Recommendations  AACE/ADA: New Consensus Statement on Inpatient Glycemic Control (2015)  Target Ranges:  Prepandial:   less than 140 mg/dL      Peak postprandial:   less than 180 mg/dL (1-2 hours)      Critically ill patients:  140 - 180 mg/dL   Results for [name], [name] (MRN [id number]) as of 03/14/2018 09:21  Ref. Range 03/13/2018 07:31 03/13/2018 12:12 03/13/2018 17:05 03/13/2018 21:24  Glucose-Capillary Latest Ref Range: 70 - 99 mg/dL 292 (H) 266 (H) 219 (H) 301 (H)   Results for [name], [name] (MRN [id number]) as of 03/14/2018 09:21  Ref. Range 03/14/2018 07:39  Glucose-Capillary Latest Ref Range: 70 - 99 mg/dL 260 (H)   Results for [name], [name] (MRN [id number]) as of 03/14/2018 09:21  Ref. Range 12/25/2017 02:06 03/13/2018 02:18  Hemoglobin A1C Latest Ref Range: 4.8 - 5.6 % 6.9 (H) 9.1 (H)    Home DM Meds: Amaryl 2 mg daily       Metformin 1000 mg BID  Current Orders: Novolog Moderate Correction Scale/ SSI (0-15 units) TID AC + HS      Patient currently getting Solumedrol 40 mg BID.  CBGs elevated >200 mg/dl consistently.  Note that Hemoglobin A1c has risen to 9.1% from 6.9% back in May.  Likely from steroid use at home.     MD- Please consider the following in-hospital insulin adjustments:  1. Start Lantus 20 units daily (0.2 units/kg dosing based on weight of 99kg)  2. Start low dose Novolog Meal Coverage: Novolog 3 units TID with meals (Please add the following Hold Parameters: Hold if pt eats <50% of meal, Hold if pt NPO)  3. MD- Do you think patient will need Insulin for home??  Have asked RNs to please begin insulin education with pt in case decision made to discharge patient home on insulin.      --Will follow patient during hospitalization--  Wyn Quaker RN, MSN, CDE Diabetes Coordinator Inpatient Glycemic Control Team Team Pager: [phone] (8a-5p)

## 2018-03-14 NOTE — Progress Notes (Signed)
PROGRESS NOTE  [name]  WNI:[id number] DOB: Feb 26, 1976 DOA: 03/12/2018 PCP: Gildardo Pounds, NP   Brief Narrative:  [name] is a 42 y.o. female with a past medical history significant for Lupus, chronic diastolic CHF, diabetes mellitus type 2, history of pericarditis with effusion, and anemia. She presented to the emergency department on 7/31 complaining of a 1 week history of worsening DOE, orthopnea, cough, LE edema, 6 pound weight gain and intermittent chest discomfort. She was recently hospitalized in June 2019 with similar symptoms and was found to have lupus pericarditis. She was diuresed and discharged with Lasix and a prednisone taper (down to 5mg  at time of admission).  In the ED, she was given 40 mg IV Lasix and 125mg  IV Solumedrol. CXR showed vascular congestion with small pleural effusion. Admitted to the hospital for further evaluation and treatment.  Subjective: Patient reports that her SOB and leg swelling have resolved - back to baseline. She does continue with some mild left chest discomfort as well as some low back pain. Back pain is worse with coughing and laying down. Cough improved. No NVD, orthopnea, fevers/chills.   Assessment & Plan:  Acute on Chronic Diastolic CHF  - Continue PO Lasix. Diuresing well with 3.4 L urine output and clinically improving. Monitor renal function with daily BMP while inpatient. - Strict I and Os. Daily weights.  Acute Lupus Pericarditis - Appreciate cardiology consult. MRI shows moderate to large pericardial effusion with mild myocarditis. Plan for R+L heart catheterization on 8/2 around 4pm. Dr. Loleta Books discussed with Dr. Virgina Jock - plan to discharge home if catheterization shows nothing worrisome.  - Transition to PO prednisone this afternoon. - Back pain likely related to pericarditis and should improve with steroids.  - Follow up with Cardiology outpatient.  Systemic lupus erythematosus - Follow ANCA, ANA, RF, Anti-Jo,  ENA. Continue Plaquenil. - Follow up with outpatient rheumatology as soon as possible.   Hypertension  - Continue metoprolol and lisinopril.   Microcytic anemia - Appears stable. 12.2 Hb today.  Diabetes - HbA1 is 9.1%, likely secondary to use of steroids over the past few months.  - Continue SSI.  - Discussed with patient and will begin Glargine injections daily. She has follow up with her primary care on 8/15.  DVT prophylaxis: SCDs Code Status: FULL Family Communication: No family at bedside  Disposition Plan: Tentatively plan to discharge home early tomorrow morning if catheterization results are normal. She will need to stay overnight for observation after cath.  Will send with PO prednisone and PO colchicine. Start on once daily Glargine. Follow up with rheumatology, PCP, and cardiology within the next 1-2 weekas  Consultants:   Cardiology  Procedures:   Echo   Cardiac MRI  Cardiac Catheterization 8/2  Antimicrobials:   None   Objective: Vitals:   03/13/18 1949 03/13/18 2135 03/14/18 0507 03/14/18 0616  BP: (!) 160/102 (!) 160/99 (!) 180/104 (!) 162/96  Pulse:  92 89   Resp:   18   Temp:   98.8 F (37.1 C)   TempSrc:   Oral   SpO2:   97%   Weight:   99.2 kg (218 lb 11.2 oz)   Height:        Intake/Output Summary (Last 24 hours) at 03/14/2018 3818 Last data filed at 03/14/2018 0800 Gross per 24 hour  Intake 603 ml  Output 3000 ml  Net -2397 ml   Filed Weights   03/12/18 1814 03/13/18 0117 03/14/18 0507  Weight: 99.8  kg (220 lb) 100.7 kg (221 lb 14.4 oz) 99.2 kg (218 lb 11.2 oz)    Examination: General appearance: obese adult female, awake and alert. NAD.   HEENT: Anicteric, conjunctiva pink, lids and lashes normal. No nasal deformity, discharge, epistaxis.  Lips moist. Skin: Warm and dry.  No jaundice. No suspicious rashes or lesions. Cardiac: RRR, nl H0-W2, systolic murmer (2/6) appreciated at RUSB. No friction rub noted. No JVD. No LE edema noted  bilaterally. Chest discomfort is non-reproducible. No cyanosis. Distal pulses are intact bilaterally. Respiratory: Normal respiratory rate and rhythm. CTAB without wheezes, crackles or rales. Abdomen: Abdomen soft and non-distended. No TTP. No ascites, masses, hepatosplenomegaly appreciated. Positive bowel sounds throughout. Neuro: AOx3. Moves all extremities. Speech fluent.    Psych: Sensorium intact and responding to questions, attention normal. Affect normal. Judgment and insight appear normal.  Data Reviewed: I have personally reviewed following labs and imaging studies:  CBC: Recent Labs  Lab 03/12/18 1828 03/13/18 0312 03/14/18 0233  WBC 6.0 7.8 6.6  HGB 12.2 12.9 12.2  HCT 38.2 40.3 37.7  MCV 77.8* 77.5* 76.6*  PLT 338 291 376   Basic Metabolic Panel: Recent Labs  Lab 03/12/18 1828 03/13/18 0312 03/14/18 0233  NA 139 138 134*  K 3.5 4.0 4.3  CL 103 101 99  CO2 26 24 26   GLUCOSE 182* 258* 324*  BUN 8 13 19   CREATININE 0.71 0.64 0.72  CALCIUM 8.7* 9.0 9.1  MG  --  1.3*  --   PHOS  --  4.2  --    GFR: Estimated Creatinine Clearance: 96.9 mL/min (by C-G formula based on SCr of 0.72 mg/dL). Liver Function Tests: Recent Labs  Lab 03/13/18 0312  AST 18  ALT 13  ALKPHOS 59  BILITOT 0.6  PROT 8.2*  ALBUMIN 3.5   Cardiac Enzymes: Recent Labs  Lab 03/13/18 0004 03/13/18 0312 03/13/18 0952 03/13/18 1543 03/13/18 2045  TROPONINI 0.05* 0.04* 0.03* 0.03* 0.03*   HbA1C: Recent Labs    03/13/18 0218  HGBA1C 9.1*   CBG: Recent Labs  Lab 03/13/18 0731 03/13/18 1212 03/13/18 1705 03/13/18 2124 03/14/18 0739  GLUCAP 292* 266* 219* 301* 260*   Thyroid Function Tests: Recent Labs    03/13/18 0218  TSH 1.041   Urine analysis:    Component Value Date/Time   COLORURINE YELLOW 01/02/2017 [place] 01/02/2017 1433   LABSPEC 1.020 07/19/2017 1317   PHURINE 7.0 07/19/2017 1317   GLUCOSEU 100 (A) 07/19/2017 1317   HGBUR MODERATE (A)  07/19/2017 1317   BILIRUBINUR NEGATIVE 07/19/2017 1317   KETONESUR NEGATIVE 07/19/2017 1317   PROTEINUR 30 (A) 07/19/2017 1317   UROBILINOGEN 1.0 07/19/2017 1317   NITRITE NEGATIVE 07/19/2017 1317   LEUKOCYTESUR SMALL (A) 07/19/2017 1317   Radiology Studies: Dg Chest 2 View  Result Date: 03/12/2018 CLINICAL DATA:  Chest and back pain EXAM: CHEST - 2 VIEW COMPARISON:  01/16/2018, 12/24/2017, 12/16/2017 FINDINGS: Cardiomegaly with vascular congestion. Tiny pleural effusions. No focal airspace disease or pneumothorax. IMPRESSION: Cardiomegaly with vascular congestion and tiny pleural effusions. Electronically Signed   By: Donavan Foil M.D.   On: 03/12/2018 19:47   Mr Cardiac Morphology W Wo Contrast  Result Date: 03/13/2018 CLINICAL DATA:  Pericardial Effusion / Myopericarditis EXAM: CARDIAC MRI TECHNIQUE: The patient was scanned on a 1.5 Tesla GE magnet. A dedicated cardiac coil was used. Functional imaging was done using Fiesta sequences. 2,3, and 4 chamber views were done to assess for RWMA's. Modified Simpson's  rule using a short axis stack was used to calculate an ejection fraction on a dedicated work Conservation officer, nature. The patient received Multihance. After 10 minutes inversion recovery sequences were used to assess for infiltration and scar tissue. CONTRAST:  Multihance FINDINGS: There was severe LAE. The RA/RV were normal in size and function. There was no diastolic RV collapse. The pericardium itself looked normal. There was a moderate to large circumferential pericardial effusion largest in the posterior and lateral dimension. The aortic root was normal The AV was tri leaflet with some restricted motion suggest CW echo correlation for any degree of AS. The aortic root was normal The LV had a septal thickness of 14 mm. Quantitative EF ws 65% (EDV 140 ESV 49 cc SV 91 cc) Delayed enhancement images showed mild uptake in the mid and basal subepicardial inferior wall and basal  subepicardial antero septum IMPRESSION: 1. Normal LV size and function EF 65% moderate LVH 2. Moderate to Large circumferential pericardial effusion biggest in the posterior and lateral dimensions with no diastolic RV collapse 3.  Normal appearing pericardium 4. Mild subepicardial gadolinium uptake in the basal and mid inferior wall and basal antero septum consistent with mild myocarditis 5. Tri leaflet AV with some restricted motion suggest echo correlation for degree of AS 6.  Severe LAE [place] Electronically Signed   By: Jenkins Rouge M.D.   On: 03/13/2018 17:19   Scheduled Meds: . colchicine  0.6 mg Oral BID  . furosemide  40 mg Oral BID  . hydroxychloroquine  200 mg Oral BID  . insulin aspart  0-15 Units Subcutaneous TID WC  . insulin aspart  0-5 Units Subcutaneous QHS  . lisinopril  10 mg Oral Daily  . methylPREDNISolone (SOLU-MEDROL) injection  40 mg Intravenous Q12H  . metoprolol tartrate  50 mg Oral BID  . pantoprazole  40 mg Oral Daily  . sodium chloride flush  3 mL Intravenous Q12H   Continuous Infusions: . sodium chloride      LOS: 1 day   Onda Kattner, PA-S [facility] 03/14/2018, 9:22 AM   [email or web address] Password [email or web address] If 7PM-7AM, please contact night-coverage

## 2018-03-14 NOTE — Progress Notes (Signed)
Subjective:   Feels much better. Chest pain persists. Shortness of breath improved.  Objective:  Vital Signs in the last 24 hours: Temp:  [98.6 F (37 C)-98.8 F (37.1 C)] 98.8 F (37.1 C) (08/02 0507) Pulse Rate:  [84-94] 89 (08/02 0507) Resp:  [18-20] 18 (08/02 0507) BP: (156-183)/(96-118) 162/96 (08/02 0616) SpO2:  [97 %-99 %] 97 % (08/02 0507) Weight:  [99.2 kg (218 lb 11.2 oz)] 99.2 kg (218 lb 11.2 oz) (08/02 0507)  Intake/Output from previous day: 08/01 0701 - 08/02 0700 In: 723 [P.O.:720; I.V.:3] Out: 3400 [Urine:3400] Intake/Output from this shift: Total I/O In: 120 [P.O.:120] Out: 500 [Urine:500]  Physical Exam: Nursing noteand vitalsreviewed. Constitutional: She isoriented to person, place, and time. She appearswell-developedand well-nourished.No distress. Morbidly obese HENT:  Head:Normocephalicand atraumatic.  Eyes:Pupils are equal, round, and reactive to light.Conjunctivaeare normal.  Neck:No JVDpresent.  Cardiovascular:Normal rateand regular rhythm. Exam revealsno gallopand no friction rub. Murmur(II/VI crescndo murmur RUSB)heard. Respiratory:Effort normaland breath sounds normal. She hasno wheezes. She hasno rales.  DD:UKGU.Bowel sounds are normal.  Musculoskeletal: She exhibitsedema(trace b/l).  Neurological: She isalertand oriented to person, place, and time. Nocranial nerve deficit.  Skin: Skin iswarmand dry.  Psychiatric: She has anormal mood and affect.    Lab Results: Recent Labs    03/13/18 0312 03/14/18 0233  WBC 7.8 6.6  HGB 12.9 12.2  PLT 291 330   Recent Labs    03/13/18 0312 03/14/18 0233  NA 138 134*  K 4.0 4.3  CL 101 99  CO2 24 26  GLUCOSE 258* 324*  BUN 13 19  CREATININE 0.64 0.72   Recent Labs    03/13/18 1543 03/13/18 2045  TROPONINI 0.03* 0.03*   Hepatic Function Panel Recent Labs    03/13/18 0312  PROT 8.2*  ALBUMIN 3.5  AST 18  ALT 13  ALKPHOS 59  BILITOT 0.6     Cardiac Studies: Echocardiogram 03/13/2018: Study Conclusions  - Left ventricle: There was moderate concentric hypertrophy. The   estimated ejection fraction was in the range of 55% to 60%. Wall   motion was normal; there were no regional wall motion   abnormalities. Doppler parameters are consistent with abnormal   left ventricular relaxation (grade 1 diastolic dysfunction). - Aortic valve: There was mild stenosis. There was trivial   regurgitation. - Left atrium: The atrium was severely dilated. - Pericardium, extracardiac: Moderate circumferential pericardial   effusion, most prominent adjacent to right atrium. No significant   change compared to previous studies in 12/2017 and 01/2018.   Normal right atrial pressure makes constrictive pericarditis   unlikely. No evidence of hemodynamic compromise. - No significant change compared to previous studies in 12/2017 and   01/2018.  Cardiac MRI 03/13/2018: 1. Normal LV size and function EF 65% moderate LVH  2. Moderate to Large circumferential pericardial effusion biggest in the posterior and lateral dimensions with no diastolic RV collapse  3.  Normal appearing pericardium  4. Mild subepicardial gadolinium uptake in the basal and mid inferior wall and basal antero septum consistent with mild myocarditis  5. Tri leaflet AV with some restricted motion suggest echo correlation for degree of AS  6.  Severe LAE    Assessment/Recommendations: 42 y/o [place] American female w/lupus, hypertension, moderate obesity, with shortness of breath, stable moderate pericardial effusion  Echocardiogram and cardiac MRI consistent with possible myopericarditis and moderate to large effusion.  No clinical or echocardiographic findings suggestive of urinary compromise.  However, given patient's recurrent hospital admission, we'll proceed with right  the spinous left heart catheterization today to rule out tampon physiology.  Continue  current medical therapy for lupus including steroids, Plaquenil, and colchicine for recurrent pericarditis.  Continue Lasix p.o. 40 mg twice daily.  Consider increasing lisinopril to 20 mg daily, continue metoprolol 50 mg p.o. twice a day.  Anterior after 11 AM today.  Plan on performing the heart catheterization at 3:30 PM.     LOS: 1 day    Doy Taaffe J Tariana Moldovan 03/14/2018, 9:16 AM

## 2018-03-14 NOTE — Interval H&P Note (Signed)
History and Physical Interval Note:  03/14/2018 3:52 PM  [name]  has presented today for surgery, with the diagnosis of rule out pericardial effusion  The various methods of treatment have been discussed with the patient and family. After consideration of risks, benefits and other options for treatment, the patient has consented to  Procedure(s): RIGHT/LEFT HEART CATH AND CORONARY ANGIOGRAPHY (N/A) as a surgical intervention .  The patient's history has been reviewed, patient examined, no change in status, stable for surgery.  I have reviewed the patient's chart and labs.  Questions were answered to the patient's satisfaction.    2012 Appropriate Use Criteria for Diagnostic Catheterization Home / Select Test of Interest Indication for RHC Pericardial Disease  Pericardial Diseases (Right and Left Heart Catheterization OR Right Heart Catheterization Alone With  Pericardial Diseases  (Right and Left Heart Catheterization OR Right Heart Catheterization Alone With/Without Left Ventriculography and Coronary Angiography) Link Here: https://www.lee.net/ Indication:  1. Suspected pericardial tamponade A (8) Indication: 91; Score 8      Ameen Mostafa J Damondre Pfeifle

## 2018-03-14 NOTE — H&P (View-Only) (Signed)
Subjective:   Feels much better. Chest pain persists. Shortness of breath improved.  Objective:  Vital Signs in the last 24 hours: Temp:  [98.6 F (37 C)-98.8 F (37.1 C)] 98.8 F (37.1 C) (08/02 0507) Pulse Rate:  [84-94] 89 (08/02 0507) Resp:  [18-20] 18 (08/02 0507) BP: (156-183)/(96-118) 162/96 (08/02 0616) SpO2:  [97 %-99 %] 97 % (08/02 0507) Weight:  [99.2 kg (218 lb 11.2 oz)] 99.2 kg (218 lb 11.2 oz) (08/02 0507)  Intake/Output from previous day: 08/01 0701 - 08/02 0700 In: 723 [P.O.:720; I.V.:3] Out: 3400 [Urine:3400] Intake/Output from this shift: Total I/O In: 120 [P.O.:120] Out: 500 [Urine:500]  Physical Exam: Nursing noteand vitalsreviewed. Constitutional: She isoriented to person, place, and time. She appearswell-developedand well-nourished.No distress. Morbidly obese HENT:  Head:Normocephalicand atraumatic.  Eyes:Pupils are equal, round, and reactive to light.Conjunctivaeare normal.  Neck:No JVDpresent.  Cardiovascular:Normal rateand regular rhythm. Exam revealsno gallopand no friction rub. Murmur(II/VI crescndo murmur RUSB)heard. Respiratory:Effort normaland breath sounds normal. She hasno wheezes. She hasno rales.  QP:YPPJ.Bowel sounds are normal.  Musculoskeletal: She exhibitsedema(trace b/l).  Neurological: She isalertand oriented to person, place, and time. Nocranial nerve deficit.  Skin: Skin iswarmand dry.  Psychiatric: She has anormal mood and affect.    Lab Results: Recent Labs    03/13/18 0312 03/14/18 0233  WBC 7.8 6.6  HGB 12.9 12.2  PLT 291 330   Recent Labs    03/13/18 0312 03/14/18 0233  NA 138 134*  K 4.0 4.3  CL 101 99  CO2 24 26  GLUCOSE 258* 324*  BUN 13 19  CREATININE 0.64 0.72   Recent Labs    03/13/18 1543 03/13/18 2045  TROPONINI 0.03* 0.03*   Hepatic Function Panel Recent Labs    03/13/18 0312  PROT 8.2*  ALBUMIN 3.5  AST 18  ALT 13  ALKPHOS 59  BILITOT 0.6     Cardiac Studies: Echocardiogram 03/13/2018: Study Conclusions  - Left ventricle: There was moderate concentric hypertrophy. The   estimated ejection fraction was in the range of 55% to 60%. Wall   motion was normal; there were no regional wall motion   abnormalities. Doppler parameters are consistent with abnormal   left ventricular relaxation (grade 1 diastolic dysfunction). - Aortic valve: There was mild stenosis. There was trivial   regurgitation. - Left atrium: The atrium was severely dilated. - Pericardium, extracardiac: Moderate circumferential pericardial   effusion, most prominent adjacent to right atrium. No significant   change compared to previous studies in 12/2017 and 01/2018.   Normal right atrial pressure makes constrictive pericarditis   unlikely. No evidence of hemodynamic compromise. - No significant change compared to previous studies in 12/2017 and   01/2018.  Cardiac MRI 03/13/2018: 1. Normal LV size and function EF 65% moderate LVH  2. Moderate to Large circumferential pericardial effusion biggest in the posterior and lateral dimensions with no diastolic RV collapse  3.  Normal appearing pericardium  4. Mild subepicardial gadolinium uptake in the basal and mid inferior wall and basal antero septum consistent with mild myocarditis  5. Tri leaflet AV with some restricted motion suggest echo correlation for degree of AS  6.  Severe LAE    Assessment/Recommendations: 42 y/o [place] American female w/lupus, hypertension, moderate obesity, with shortness of breath, stable moderate pericardial effusion  Echocardiogram and cardiac MRI consistent with possible myopericarditis and moderate to large effusion.  No clinical or echocardiographic findings suggestive of urinary compromise.  However, given patient's recurrent hospital admission, we'll proceed with right  the spinous left heart catheterization today to rule out tampon physiology.  Continue  current medical therapy for lupus including steroids, Plaquenil, and colchicine for recurrent pericarditis.  Continue Lasix p.o. 40 mg twice daily.  Consider increasing lisinopril to 20 mg daily, continue metoprolol 50 mg p.o. twice a day.  Anterior after 11 AM today.  Plan on performing the heart catheterization at 3:30 PM.     LOS: 1 day    Almando Brawley J Mailen Newborn 03/14/2018, 9:16 AM

## 2018-03-14 NOTE — Progress Notes (Signed)
Pt said dr Loleta Books says she can have a heating pad when she spoke to him today for back spasm,  Paged Dr Loleta Books to advise

## 2018-03-14 NOTE — Progress Notes (Signed)
Met with pt this afternoon.  Discussed with pt that her A1c is elevated likely due to all the steroids she has been taking at home since May.  Also discussed with pt that she may likely need Insulin when she goes home.  Pt told me that the MD addressed that with her today and plans to send her home on one shot of insulin per day plus her home oral DM meds.  Pt has been practicing giving her insulin.  Has given one shot today and plans to give insulin injections later today.  RN aware that pt needs to continue to practice all injections as possible.  Pt told me that she gets her meds from the [facility].     MD- If you send pt home over the weekend, may want to wait and have pt fill her Lantus Rx on Monday once the [facility] pharmacy is open.    If she tries to fill the Lantus Rx at any other pharmacy, the insulin will likely cost more than $100 (or could be more).  Please have pt wait and fill her Lantus Rx at the [facility] on Monday.      --Will follow patient during hospitalization--  Wyn Quaker RN, MSN, CDE Diabetes Coordinator Inpatient Glycemic Control Team Team Pager: [phone] (8a-5p)

## 2018-03-14 NOTE — Progress Notes (Signed)
PROGRESS NOTE    [name]  OYD:[id number] DOB: 1976/05/20 DOA: 03/12/2018 PCP: Katherine Pounds, NP      Brief Narrative:  [name] is a 42 y.o. female with a past medical history significant for Lupus, chronic diastolic CHF, diabetes mellitus type 2, history of pericarditis with effusion, and anemia. She presented to the emergency department on 7/31 complaining of a 1 week history of worsening DOE, orthopnea, cough, LE edema, 6 pound weight gain and intermittent chest discomfort.     Assessment & Plan:  Acute lupus pericarditis -Continue colchicine -Transition Solu-Medrol to prednisone tomorrow morning -Continue PPI -Consult to Cardiology, appreciate cares -To heart cath today valuate intracardiac pressures   Systemic lupus erythematosus ANA positive, RNP positive, chromatin antibodies positive, other ENA's negative, ANCA negative, rheumatoid factor negative. -Continue Plaquenil  Acute on chronic diastolic CHF  Hypertension Blood pressure poorly controlled -Continue furosemide, beta-blocker -Increase lisinopril -Restart amlodipine -Monitor K -Check daily weights, daily BMP  Microcytic anemia Remained stable relative to baseline  Diabetes TSH normal hemoglobin A1c up to 9%, likely from steroids recently. -Continue SSI -Start Lantus  Hypomagnesemia       DVT prophylaxis: SCDs Code Status: FULL Family Communication: None present MDM and disposition Plan: The below labs and imaging reports were reviewed and summarized above.  The patient was admitted with chest pain dyspnea on exertion, in the context of now chronic pericardial effusion, and hypertensive urgency.  MRI yesterday showed myopericarditis, her pain is improving with steroids, she will undergo heart catheterization today to evaluate for signs of tamponade, likely be discharged tomorrow.  .     Consultants:   Cardiology  Procedures:   Echo  Cardiac MRI  Antimicrobials:    None    Subjective: She has no dyspnea with exertion, her chest pain is rare, she has some right-sided flank pain.  No fever, chills, cough, sputum.  Objective: Vitals:   03/14/18 1730 03/14/18 1745 03/14/18 1825 03/14/18 1830  BP: (!) 176/108 (!) 146/95 (!) 169/103 (!) 173/94  Pulse: 89 84 93 87  Resp:      Temp:      TempSrc:      SpO2: 100%     Weight:      Height:        Intake/Output Summary (Last 24 hours) at 03/14/2018 1856 Last data filed at 03/14/2018 1731 Gross per 24 hour  Intake 522.12 ml  Output 2325 ml  Net -1802.88 ml   Filed Weights   03/12/18 1814 03/13/18 0117 03/14/18 0507  Weight: 99.8 kg (220 lb) 100.7 kg (221 lb 14.4 oz) 99.2 kg (218 lb 11.2 oz)    Examination: General appearance: Obese adult female, sitting up in chair, no acute distress HEENT: Anicteric, conjunctive are pink, lids and lashes normal.  No nasal deformity, discharge, epistaxis.  Lips moist, teeth normal, oropharynx moist, no oral lesions, hearing normal. Skin: Warm and dry, no suspicious rashes or lesions Cardiac: Regular rate and rhythm, no murmurs appreciated.  JVP not visible.  No lower extremity edema. Respiratory: No respiratory rate and rhythm, lungs clear without rales or wheezes. Abdomen: Abdomen soft.  noi TTP or guarding. No ascites, distension, hepatosplenomegaly.   MSK: No deformities or effusions. Neuro: Awake and alert.  EOMI, moves all extremities. Speech fluent.    Psych: Sensorium intact and responding to questions, attention normal.  Affect normal.  Judgment and insight appear normal    Data Reviewed: I have personally reviewed following labs and imaging studies:  CBC:  Recent Labs  Lab 03/12/18 1828 03/13/18 0312 03/14/18 0233  WBC 6.0 7.8 6.6  HGB 12.2 12.9 12.2  HCT 38.2 40.3 37.7  MCV 77.8* 77.5* 76.6*  PLT 338 291 147   Basic Metabolic Panel: Recent Labs  Lab 03/12/18 1828 03/13/18 0312 03/14/18 0233  NA 139 138 134*  K 3.5 4.0 4.3  CL 103  101 99  CO2 26 24 26   GLUCOSE 182* 258* 324*  BUN 8 13 19   CREATININE 0.71 0.64 0.72  CALCIUM 8.7* 9.0 9.1  MG  --  1.3*  --   PHOS  --  4.2  --    GFR: Estimated Creatinine Clearance: 96.9 mL/min (by C-G formula based on SCr of 0.72 mg/dL). Liver Function Tests: Recent Labs  Lab 03/13/18 0312  AST 18  ALT 13  ALKPHOS 59  BILITOT 0.6  PROT 8.2*  ALBUMIN 3.5   No results for input(s): LIPASE, AMYLASE in the last 168 hours. No results for input(s): AMMONIA in the last 168 hours. Coagulation Profile: No results for input(s): INR, PROTIME in the last 168 hours. Cardiac Enzymes: Recent Labs  Lab 03/13/18 0004 03/13/18 0312 03/13/18 0952 03/13/18 1543 03/13/18 2045  TROPONINI 0.05* 0.04* 0.03* 0.03* 0.03*   BNP (last 3 results) No results for input(s): PROBNP in the last 8760 hours. HbA1C: Recent Labs    03/13/18 0218  HGBA1C 9.1*   CBG: Recent Labs  Lab 03/13/18 1705 03/13/18 2124 03/14/18 0739 03/14/18 1201 03/14/18 1717  GLUCAP 219* 301* 260* 220* 248*   Lipid Profile: No results for input(s): CHOL, HDL, LDLCALC, TRIG, CHOLHDL, LDLDIRECT in the last 72 hours. Thyroid Function Tests: Recent Labs    03/13/18 0218  TSH 1.041   Anemia Panel: No results for input(s): VITAMINB12, FOLATE, FERRITIN, TIBC, IRON, RETICCTPCT in the last 72 hours. Urine analysis:    Component Value Date/Time   COLORURINE YELLOW 01/02/2017 [place] 01/02/2017 1433   LABSPEC 1.020 07/19/2017 1317   PHURINE 7.0 07/19/2017 1317   GLUCOSEU 100 (A) 07/19/2017 1317   HGBUR MODERATE (A) 07/19/2017 1317   BILIRUBINUR NEGATIVE 07/19/2017 1317   KETONESUR NEGATIVE 07/19/2017 1317   PROTEINUR 30 (A) 07/19/2017 1317   UROBILINOGEN 1.0 07/19/2017 1317   NITRITE NEGATIVE 07/19/2017 1317   LEUKOCYTESUR SMALL (A) 07/19/2017 1317   Sepsis Labs: @LABRCNTIP (procalcitonin:4,lacticacidven:4)  )No results found for this or any previous visit (from the past 240 hour(s)).        Radiology Studies: Dg Chest 2 View  Result Date: 03/12/2018 CLINICAL DATA:  Chest and back pain EXAM: CHEST - 2 VIEW COMPARISON:  01/16/2018, 12/24/2017, 12/16/2017 FINDINGS: Cardiomegaly with vascular congestion. Tiny pleural effusions. No focal airspace disease or pneumothorax. IMPRESSION: Cardiomegaly with vascular congestion and tiny pleural effusions. Electronically Signed   By: Donavan Foil M.D.   On: 03/12/2018 19:47   Mr Cardiac Morphology W Wo Contrast  Result Date: 03/13/2018 CLINICAL DATA:  Pericardial Effusion / Myopericarditis EXAM: CARDIAC MRI TECHNIQUE: The patient was scanned on a 1.5 Tesla GE magnet. A dedicated cardiac coil was used. Functional imaging was done using Fiesta sequences. 2,3, and 4 chamber views were done to assess for RWMA's. Modified Simpson's rule using a short axis stack was used to calculate an ejection fraction on a dedicated work Conservation officer, nature. The patient received Multihance. After 10 minutes inversion recovery sequences were used to assess for infiltration and scar tissue. CONTRAST:  Multihance FINDINGS: There was severe LAE. The RA/RV were normal  in size and function. There was no diastolic RV collapse. The pericardium itself looked normal. There was a moderate to large circumferential pericardial effusion largest in the posterior and lateral dimension. The aortic root was normal The AV was tri leaflet with some restricted motion suggest CW echo correlation for any degree of AS. The aortic root was normal The LV had a septal thickness of 14 mm. Quantitative EF ws 65% (EDV 140 ESV 49 cc SV 91 cc) Delayed enhancement images showed mild uptake in the mid and basal subepicardial inferior wall and basal subepicardial antero septum IMPRESSION: 1. Normal LV size and function EF 65% moderate LVH 2. Moderate to Large circumferential pericardial effusion biggest in the posterior and lateral dimensions with no diastolic RV collapse 3.  Normal  appearing pericardium 4. Mild subepicardial gadolinium uptake in the basal and mid inferior wall and basal antero septum consistent with mild myocarditis 5. Tri leaflet AV with some restricted motion suggest echo correlation for degree of AS 6.  Severe LAE [place] Electronically Signed   By: Jenkins Rouge M.D.   On: 03/13/2018 17:19        Scheduled Meds: . amLODipine  10 mg Oral Daily  . colchicine  0.6 mg Oral BID  . furosemide  40 mg Oral BID  . hydroxychloroquine  200 mg Oral BID  . insulin aspart  0-15 Units Subcutaneous TID WC  . insulin aspart  0-5 Units Subcutaneous QHS  . insulin glargine  10 Units Subcutaneous QHS  . [START ON 03/15/2018] lisinopril  20 mg Oral Daily  . metoprolol tartrate  50 mg Oral BID  . pantoprazole  40 mg Oral Daily  . [START ON 03/15/2018] predniSONE  40 mg Oral Q breakfast  . rosuvastatin  20 mg Oral q1800  . sodium chloride flush  3 mL Intravenous Q12H  . sodium chloride flush  3 mL Intravenous Q12H   Continuous Infusions: . sodium chloride    . sodium chloride       LOS: 1 day    Time spent: 25 minutes    Edwin Dada, MD [facility] 03/14/2018, 6:56 PM     Pager [phone] --- please page though AMION:  [email or web address] If 7PM-7AM, please contact night-coverage

## 2018-03-15 DIAGNOSIS — I2583 Coronary atherosclerosis due to lipid rich plaque: Secondary | ICD-10-CM

## 2018-03-15 DIAGNOSIS — I2510 Atherosclerotic heart disease of native coronary artery without angina pectoris: Secondary | ICD-10-CM

## 2018-03-15 LAB — CBC
HCT: 38.8 % (ref 36.0–46.0)
Hemoglobin: 12.3 g/dL (ref 12.0–15.0)
MCH: 24.5 pg — ABNORMAL LOW (ref 26.0–34.0)
MCHC: 31.7 g/dL (ref 30.0–36.0)
MCV: 77.1 fL — ABNORMAL LOW (ref 78.0–100.0)
Platelets: 339 10*3/uL (ref 150–400)
RBC: 5.03 MIL/uL (ref 3.87–5.11)
RDW: 15.1 % (ref 11.5–15.5)
WBC: 8 10*3/uL (ref 4.0–10.5)

## 2018-03-15 LAB — GLUCOSE, CAPILLARY
Glucose-Capillary: 221 mg/dL — ABNORMAL HIGH (ref 70–99)
Glucose-Capillary: 242 mg/dL — ABNORMAL HIGH (ref 70–99)

## 2018-03-15 LAB — BASIC METABOLIC PANEL
Anion gap: 11 (ref 5–15)
BUN: 20 mg/dL (ref 6–20)
CO2: 25 mmol/L (ref 22–32)
Calcium: 9.2 mg/dL (ref 8.9–10.3)
Chloride: 101 mmol/L (ref 98–111)
Creatinine, Ser: 0.78 mg/dL (ref 0.44–1.00)
GFR calc Af Amer: >60 mL/min (ref >60)
GFR calc non Af Amer: >60 mL/min (ref >60)
Glucose, Bld: 208 mg/dL — ABNORMAL HIGH (ref 70–99)
Potassium: 3.7 mmol/L (ref 3.5–5.1)
Sodium: 137 mmol/L (ref 135–145)

## 2018-03-15 MED ORDER — GLIMEPIRIDE 4 MG PO TABS
2.0000 mg | ORAL_TABLET | ORAL | Status: DC
  Administered 2018-03-15: 2 mg via ORAL
  Filled 2018-03-15: qty 1

## 2018-03-15 MED ORDER — ROSUVASTATIN CALCIUM 20 MG PO TABS: 20.0000 mg | ORAL_TABLET | Freq: Every day | ORAL | 0 refills | Status: DC

## 2018-03-15 MED ORDER — METFORMIN HCL 500 MG PO TABS
1000.0000 mg | ORAL_TABLET | Freq: Two times a day (BID) | ORAL | Status: DC
Start: 2018-03-15 — End: 2018-03-15
  Administered 2018-03-15: 1000 mg via ORAL
  Filled 2018-03-15: qty 2

## 2018-03-15 MED ORDER — INSULIN GLARGINE 100 UNIT/ML ~~LOC~~ SOLN: 12.0000 [IU] | Freq: Every day | SUBCUTANEOUS | 2 refills | Status: DC

## 2018-03-15 MED ORDER — PREDNISONE 20 MG PO TABS: 40.0000 mg | ORAL_TABLET | Freq: Every day | ORAL | 0 refills | Status: DC

## 2018-03-15 MED ORDER — COLCHICINE 0.6 MG PO TABS: 0.6000 mg | ORAL_TABLET | Freq: Two times a day (BID) | ORAL | 0 refills | Status: DC

## 2018-03-15 MED ORDER — LISINOPRIL 20 MG PO TABS: 20.0000 mg | ORAL_TABLET | Freq: Every day | ORAL | 0 refills | Status: DC

## 2018-03-15 NOTE — Discharge Summary (Signed)
Physician Discharge Summary  [name] WSF:[id number] DOB: 1976-05-25 DOA: 03/12/2018  PCP: Gildardo Pounds, NP  Admit date: 03/12/2018 Discharge date: 03/15/2018  Admitted From: Home  Disposition:  Home   Recommendations for Outpatient Follow-up:  1. Follow up with PCP in 1-2 weeks for adjustment new insulin 2. Follow up with Rheumatology as soon as able 3. Follow up with Dr. 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       Discharge Diagnoses:    Acute on chronic diastolic CHF  Hypertension Presented with clinical CHF presumed from persistent pericarditis and poorly controlled blood pressure.  Lisinopril increased.  BP improved.  Diuresed 5L with IV Lasix, swelling and dyspnea on exertion resolved.   There was concern that she may have tamponade given recurrent presentation with CHF since the pericardial effusion developed, so she underwent LHC to eval for equalization of pressures/tamponade physiology, but this showed normal heart pressures.   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  Started on IV Solu-medrol.  Pain responded well, transitioned to prednisone.     Systemic lupus erythematosus ANA positive, RNP positive, chromatin antibodies positive, other ENA's negative, ANCA negative, rheumatoid factor negative.  Plaquenil continued.     Microcytic anemia Remained stable relative to baseline  Diabetes Hemoglobin A1c up to 9%, likely from steroids recently.   Started on Lantus with instructions to titrate up after discharge.  Close follow up with PCP.   Coronary artery disease LAD non obstructive focal proximal lesion noted.  Started on aspirin and statin.     Discharge Instructions  Discharge Instructions    Diet - low sodium heart healthy   Complete by:  As directed    Discharge instructions   Complete by:  As directed    From Dr. Loleta Books: You were admitted for chest pain and trouble breathing from your pericardial effusion coming back.  For the effusion: Take colchicine 0.6 mg twice daily for 2 weeks, then continue only if Dr. Virgina Jock or Dr. Einar Gip recommends it. Take prednisone 40 mg daily, taper per Dr. Virgina Jock or Dr. Estanislado Pandy the new Rheumatologist   For your blood pressure: Continue your home Lasix as before Increase your lisinopril to 20 mg daily   On your heart catheterization, one thing Dr. Virgina Jock noticed was some atherosclerosis (or "fatty plaque buildup"). You should take a baby aspirin daily if you don't already You should start a cholesterol medicine for life.  Take aspirin 81 mg daily Take new rosuvastatin/Crestor 20 mg nightly  Crestor is a cholesterol medicine.  It may cause cramping.  Follow up with Dr. Virgina Jock if it does.   For your blood sugars: Restart your Amaryl and metformin this weekend. While you are taking prednisone: take glargine insulin (Lantus) THis is a once daily insulin Take it in the morning Start with 12 units in the morning Every day, check your blood sugar first thing in the morning.   If it is MORE THAN 200 mg/dL, increase the Lantus that morning by 2 units Wait at least 2 days before increasing the Lantus again, but you  increase it again after at least 2 days if your morning blood sugar is still more than 200. YOu may have to go up to 20 units of Lantus or more. Once your blood sugar is less than 200 in the morning, stop increasing the Lantus and stay at that dose. When  you stop taking prednisone, you probably can stop the Lantus.  Talk to your primary care doctor  If you have low blood sugars (less than 70), reduce your Lantus that day or the next day.   Increase activity slowly   Complete by:  As directed      Allergies as of 03/15/2018      Reactions   Septra [bactrim] Hives      Medication List    STOP taking these medications   ibuprofen 400 MG tablet Commonly known as:  ADVIL,MOTRIN     TAKE these medications   acetaminophen 325 MG tablet Commonly known as:  TYLENOL Take 2 tablets (650 mg total) by mouth every 4 (four) hours as needed for headache or mild pain.   amLODipine 10 MG tablet Commonly known as:  NORVASC Take 1 tablet (10 mg total) by mouth daily.   colchicine 0.6 MG tablet Take 1 tablet (0.6 mg total) by mouth 2 (two) times daily for 14 days. What changed:  when to take this   furosemide 40 MG tablet Commonly known as:  LASIX Take 1 tablet (40 mg total) by mouth 2 (two) times daily.   glimepiride 2 MG tablet Commonly known as:  AMARYL Take 1 tablet (2 mg total) by mouth every morning.   glucose blood test strip Commonly known as:  TRUE METRIX BLOOD GLUCOSE TEST Use as instructed   hydroxychloroquine 200 MG tablet Commonly known as:  PLAQUENIL Take 1 tablet (200 mg total) by mouth 2 (two) times daily.   insulin glargine 100 UNIT/ML injection Commonly known as:  LANTUS Inject 0.12 mLs (12 Units total) into the skin daily.   lisinopril 20 MG tablet Commonly known as:  PRINIVIL,ZESTRIL Take 1 tablet (20 mg total) by mouth daily. What changed:    medication strength  how much to take   metFORMIN 1000 MG tablet Commonly known as:  GLUCOPHAGE Take 1 tablet (1,000 mg total) by mouth 2 (two) times daily with a meal.   metoprolol tartrate 50 MG tablet Commonly known as:  LOPRESSOR Take 1 tablet (50 mg total) by mouth 2 (two) times daily.   omeprazole 20 MG capsule Commonly known as:  PRILOSEC Take 1 capsule  (20 mg total) by mouth daily.   predniSONE 20 MG tablet Commonly known as:  DELTASONE Take 2 tablets (40 mg total) by mouth daily with breakfast. What changed:    medication strength  how much to take  how to take this  when to take this  additional instructions   rosuvastatin 20 MG tablet Commonly known as:  CRESTOR Take 1 tablet (20 mg total) by mouth daily at 6 PM.   TRUE METRIX METER w/Device Kit Check blood sugars fasting and at bedtime   TRUEPLUS LANCETS 28G Misc Check blood sugars as directed       Allergies  Allergen Reactions  . Septra [Bactrim] Hives    Consultations:  Cardiology   Procedures/Studies: Dg Chest 2 View  Result Date: 03/12/2018 CLINICAL DATA:  Chest and back pain EXAM: CHEST - 2 VIEW COMPARISON:  01/16/2018, 12/24/2017, 12/16/2017 FINDINGS: Cardiomegaly with vascular congestion. Tiny pleural effusions. No focal airspace disease or pneumothorax. IMPRESSION:  Cardiomegaly with vascular congestion and tiny pleural effusions. Electronically Signed   By: Donavan Foil M.D.   On: 03/12/2018 19:47   Mr Cardiac Morphology W Wo Contrast  Result Date: 03/13/2018 CLINICAL DATA:  Pericardial Effusion / Myopericarditis EXAM: CARDIAC MRI TECHNIQUE: The patient was scanned on a 1.5 Tesla GE magnet. A dedicated cardiac coil was used. Functional imaging was done using Fiesta sequences. 2,3, and 4 chamber views were done to assess for RWMA's. Modified Simpson's rule using a short axis stack was used to calculate an ejection fraction on a dedicated work Conservation officer, nature. The patient received Multihance. After 10 minutes inversion recovery sequences were used to assess for infiltration and scar tissue. CONTRAST:  Multihance FINDINGS: There was severe LAE. The RA/RV were normal in size and function. There was no diastolic RV collapse. The pericardium itself looked normal. There was a moderate to large circumferential pericardial effusion largest in the  posterior and lateral dimension. The aortic root was normal The AV was tri leaflet with some restricted motion suggest CW echo correlation for any degree of AS. The aortic root was normal The LV had a septal thickness of 14 mm. Quantitative EF ws 65% (EDV 140 ESV 49 cc SV 91 cc) Delayed enhancement images showed mild uptake in the mid and basal subepicardial inferior wall and basal subepicardial antero septum IMPRESSION: 1. Normal LV size and function EF 65% moderate LVH 2. Moderate to Large circumferential pericardial effusion biggest in the posterior and lateral dimensions with no diastolic RV collapse 3.  Normal appearing pericardium 4. Mild subepicardial gadolinium uptake in the basal and mid inferior wall and basal antero septum consistent with mild myocarditis 5. Tri leaflet AV with some restricted motion suggest echo correlation for degree of AS 6.  Severe LAE [place] Electronically Signed   By: Jenkins Rouge M.D.   On: 03/13/2018 17:19   Echo LV EF: 55% -   60%  ------------------------------------------------------------------- Indications:      Pericardial effusion 423.9.  ------------------------------------------------------------------- History:   PMH:   Murmur.  Congestive heart failure.  PMH:  Lupus. Enlarged Heart.  Risk factors:  Hypertension.  ------------------------------------------------------------------- Study Conclusions  - Left ventricle: There was moderate concentric hypertrophy. The   estimated ejection fraction was in the range of 55% to 60%. Wall   motion was normal; there were no regional wall motion   abnormalities. Doppler parameters are consistent with abnormal   left ventricular relaxation (grade 1 diastolic dysfunction). - Aortic valve: There was mild stenosis. There was trivial   regurgitation. - Left atrium: The atrium was severely dilated. - Pericardium, extracardiac: Moderate circumferential pericardial   effusion, most prominent adjacent to  right atrium. No significant   change compared to previous studies in 12/2017 and 01/2018.   Normal right atrial pressure makes constrictive pericarditis   unlikely. No evidence of hemodynamic compromise. - No significant change compared to previous studies in 12/2017 and   01/2018.   LM: Normal LAD: Prox focal 50% stenosis LCx: Normal RCA: Normal  Borderline pulmonary hypertension (mean PA 20-24 mmHg) Invasive hemodynamic findings do not suggest tamponade or constriction physiology.  Recommendation: Aggressive management for hypertension, hyperlipidemia Aggressive medical management for SLE.        Subjective: Feeling much better.  Chest pain mostly gone.  No knee on exertion, swelling, orthopnea.  No fever, cough, sputum.  Discharge Exam: Vitals:   03/15/18 0549 03/15/18 1154  BP: (!) 148/88 (!) 144/90  Pulse: 68 86  Resp: 18  20  Temp: 98.7 F (37.1 C) 98.3 F (36.8 C)  SpO2: 95% 94%   Vitals:   03/14/18 2117 03/14/18 2316 03/15/18 0549 03/15/18 1154  BP: (!) 141/86 (!) 148/91 (!) 148/88 (!) 144/90  Pulse: 90 98 68 86  Resp: _0 Temp: 98.2 F (36.8 C)  98.7 F (37.1 C) 98.3 F (36.8 C)  TempSrc: Oral  Oral Oral  SpO2: 96% 96% 95% 94%  Weight:   98.2 kg (216 lb 9.6 oz)   Height:        General: Pt is alert, awake, not in acute distress Cardiovascular: RRR, S1/S2 +, no rubs, no gallops Respiratory: CTA bilaterally, no wheezing, no rhonchi Abdominal: Soft, NT, ND, bowel sounds + Extremities: no edema, no cyanosis    The results of significant diagnostics from this hospitalization (including imaging, microbiology, ancillary and laboratory) are listed below for reference.     Microbiology: No results found for this or any previous visit (from the past 240 hour(s)).   Labs: BNP (last 3 results) Recent Labs    01/16/18 1326 03/12/18 2155 03/13/18 0218  BNP 180.2* 180.7* 132.4*   Basic Metabolic Panel: Recent Labs  Lab  03/12/18 1828 03/13/18 0312 03/14/18 0233 03/15/18 0607  NA 139 138 134* 137  K 3.5 4.0 4.3 3.7  CL 103 101 99 101  CO2 _1 GLUCOSE 182* 258* 324* 208*  BUN _2 CREATININE 0.71 0.64 0.72 0.78  CALCIUM 8.7* 9.0 9.1 9.2  MG  --  1.3*  --   --   PHOS  --  4.2  --   --    Liver Function Tests: Recent Labs  Lab 03/13/18 0312  AST 18  ALT 13  ALKPHOS 59  BILITOT 0.6  PROT 8.2*  ALBUMIN 3.5   No results for input(s): LIPASE, AMYLASE in the last 168 hours. No results for input(s): AMMONIA in the last 168 hours. CBC: Recent Labs  Lab 03/12/18 1828 03/13/18 0312 03/14/18 0233 03/15/18 0607  WBC 6.0 7.8 6.6 8.0  HGB 12.2 12.9 12.2 12.3  HCT 38.2 40.3 37.7 38.8  MCV 77.8* 77.5* 76.6* 77.1*  PLT 338 291 330 339   Cardiac Enzymes: Recent Labs  Lab 03/13/18 0004 03/13/18 0312 03/13/18 0952 03/13/18 1543 03/13/18 2045  TROPONINI 0.05* 0.04* 0.03* 0.03* 0.03*   BNP: Invalid input(s): POCBNP CBG: Recent Labs  Lab 03/14/18 1201 03/14/18 1717 03/14/18 2119 03/15/18 0727 03/15/18 1146  GLUCAP 220* 248* 258* 221* 242*   D-Dimer Recent Labs    03/13/18 0218  DDIMER <0.27   Hgb A1c Recent Labs    03/13/18 0218  HGBA1C 9.1*   Lipid Profile No results for input(s): CHOL, HDL, LDLCALC, TRIG, CHOLHDL, LDLDIRECT in the last 72 hours. Thyroid function studies Recent Labs    03/13/18 0218  TSH 1.041   Anemia work up No results for input(s): VITAMINB12, FOLATE, FERRITIN, TIBC, IRON, RETICCTPCT in the last 72 hours. Urinalysis    Component Value Date/Time   COLORURINE YELLOW 01/02/2017 [place] 01/02/2017 1433   LABSPEC 1.020 07/19/2017 1317   PHURINE 7.0 07/19/2017 1317   GLUCOSEU 100 (A) 07/19/2017 1317   HGBUR MODERATE (A) 07/19/2017 1317   BILIRUBINUR NEGATIVE 07/19/2017 1317   KETONESUR NEGATIVE 07/19/2017 1317   PROTEINUR 30 (A) 07/19/2017 1317   UROBILINOGEN 1.0 07/19/2017 1317   NITRITE NEGATIVE 07/19/2017  1317   LEUKOCYTESUR SMALL (A) 07/19/2017 1317  Sepsis Labs Invalid input(s): PROCALCITONIN,  WBC,  LACTICIDVEN Microbiology No results found for this or any previous visit (from the past 240 hour(s)).   Time coordinating discharge: 40 minutes       SIGNED:   Edwin Dada, MD  [facility] 03/15/2018, 3:52 PM

## 2018-03-15 NOTE — Plan of Care (Signed)
  Problem: Cardiovascular: Goal: Vascular access site(s) Level 0-1 will be maintained Outcome: Progressing   Problem: Health Behavior/Discharge Planning: Goal: Ability to safely manage health-related needs after discharge will improve Outcome: Progressing   Problem: Activity: Goal: Ability to return to baseline activity level will improve Outcome: Progressing

## 2018-03-15 NOTE — Care Management (Signed)
Spoke with patient regarding d/c and new medications.  Pt states Dr. Loleta Books advised her she can wait and fill scripts at [facility] on Monday.  Pt has new scripts for Lantus and Crestor.  Scripts electronically sent to [facility].

## 2018-03-17 ENCOUNTER — Encounter (HOSPITAL_COMMUNITY): Payer: Self-pay | Admitting: Cardiology

## 2018-03-17 MED FILL — ROSUVASTATIN CALCIUM 20 MG: 20 | 30 days supply | Qty: 30 | Fill #0

## 2018-03-17 MED FILL — !COLCRYS 0.6 MG TABLET: 0.6 MG | 30 days supply | Qty: 60 | Fill #1

## 2018-03-17 MED FILL — predniSONE 20 MG TABS: 20 | 30 days supply | Qty: 60 | Fill #0

## 2018-03-17 MED FILL — HYDROXYCHLOROQUINE SULFATE: 200 | 30 days supply | Qty: 60 | Fill #1

## 2018-03-17 MED FILL — LISINOPRIL 20 MG TAB: 20 | 30 days supply | Qty: 30 | Fill #0

## 2018-03-17 MED FILL — !LANTUS 100 UNITS/ML VIAL: 100 | 28 days supply | Qty: 10 | Fill #0

## 2018-04-01 ENCOUNTER — Ambulatory Visit: Payer: Self-pay | Attending: Nurse Practitioner | Admitting: Nurse Practitioner

## 2018-04-01 ENCOUNTER — Encounter: Payer: Self-pay | Admitting: Nurse Practitioner

## 2018-04-01 VITALS — BP 127/89 | HR 84 | Temp 98.5°F | Ht 65.0 in | Wt 220.6 lb

## 2018-04-01 DIAGNOSIS — I110 Hypertensive heart disease with heart failure: Secondary | ICD-10-CM | POA: Insufficient documentation

## 2018-04-01 DIAGNOSIS — I5032 Chronic diastolic (congestive) heart failure: Secondary | ICD-10-CM

## 2018-04-01 DIAGNOSIS — Z882 Allergy status to sulfonamides status: Secondary | ICD-10-CM | POA: Insufficient documentation

## 2018-04-01 DIAGNOSIS — I10 Essential (primary) hypertension: Secondary | ICD-10-CM

## 2018-04-01 DIAGNOSIS — Z794 Long term (current) use of insulin: Secondary | ICD-10-CM | POA: Insufficient documentation

## 2018-04-01 DIAGNOSIS — I5033 Acute on chronic diastolic (congestive) heart failure: Secondary | ICD-10-CM | POA: Insufficient documentation

## 2018-04-01 DIAGNOSIS — I2510 Atherosclerotic heart disease of native coronary artery without angina pectoris: Secondary | ICD-10-CM | POA: Insufficient documentation

## 2018-04-01 DIAGNOSIS — E1165 Type 2 diabetes mellitus with hyperglycemia: Secondary | ICD-10-CM | POA: Insufficient documentation

## 2018-04-01 DIAGNOSIS — Z79899 Other long term (current) drug therapy: Secondary | ICD-10-CM | POA: Insufficient documentation

## 2018-04-01 LAB — GLUCOSE, POCT (MANUAL RESULT ENTRY): POC Glucose: 248 mg/dl — AB (ref 70–99)

## 2018-04-01 NOTE — Patient Instructions (Signed)
Prednisone tablets °What is this medicine? °PREDNISONE (PRED ni sone) is a corticosteroid. It is commonly used to treat inflammation of the skin, joints, lungs, and other organs. Common conditions treated include asthma, allergies, and arthritis. It is also used for other conditions, such as blood disorders and diseases of the adrenal glands. °This medicine may be used for other purposes; ask your health care provider or pharmacist if you have questions. °COMMON BRAND NAME(S): Deltasone, Predone, Sterapred, Sterapred DS °What should I tell my health care provider before I take this medicine? °They need to know if you have any of these conditions: °-Cushing's syndrome °-diabetes °-glaucoma °-heart disease °-high blood pressure °-infection (especially a virus infection such as chickenpox, cold sores, or herpes) °-kidney disease °-liver disease °-mental illness °-myasthenia gravis °-osteoporosis °-seizures °-stomach or intestine problems °-thyroid disease °-an unusual or allergic reaction to lactose, prednisone, other medicines, foods, dyes, or preservatives °-pregnant or trying to get pregnant °-breast-feeding °How should I use this medicine? °Take this medicine by mouth with a glass of water. Follow the directions on the prescription label. Take this medicine with food. If you are taking this medicine once a day, take it in the morning. Do not take more medicine than you are told to take. Do not suddenly stop taking your medicine because you may develop a severe reaction. Your doctor will tell you how much medicine to take. If your doctor wants you to stop the medicine, the dose may be slowly lowered over time to avoid any side effects. °Talk to your pediatrician regarding the use of this medicine in children. Special care may be needed. °Overdosage: If you think you have taken too much of this medicine contact a poison control center or emergency room at once. °NOTE: This medicine is only for you. Do not share this  medicine with others. °What if I miss a dose? °If you miss a dose, take it as soon as you can. If it is almost time for your next dose, talk to your doctor or health care professional. You may need to miss a dose or take an extra dose. Do not take double or extra doses without advice. °What may interact with this medicine? °Do not take this medicine with any of the following medications: °-metyrapone °-mifepristone °This medicine may also interact with the following medications: °-aminoglutethimide °-amphotericin B °-aspirin and aspirin-like medicines °-barbiturates °-certain medicines for diabetes, like glipizide or glyburide °-cholestyramine °-cholinesterase inhibitors °-cyclosporine °-digoxin °-diuretics °-ephedrine °-female hormones, like estrogens and birth control pills °-isoniazid °-ketoconazole °-NSAIDS, medicines for pain and inflammation, like ibuprofen or naproxen °-phenytoin °-rifampin °-toxoids °-vaccines °-warfarin °This list may not describe all possible interactions. Give your health care provider a list of all the medicines, herbs, non-prescription drugs, or dietary supplements you use. Also tell them if you smoke, drink alcohol, or use illegal drugs. Some items may interact with your medicine. °What should I watch for while using this medicine? °Visit your doctor or health care professional for regular checks on your progress. If you are taking this medicine over a prolonged period, carry an identification card with your name and address, the type and dose of your medicine, and your doctor's name and address. °This medicine may increase your risk of getting an infection. Tell your doctor or health care professional if you are around anyone with measles or chickenpox, or if you develop sores or blisters that do not heal properly. °If you are going to have surgery, tell your doctor or health care professional that   you have taken this medicine within the last twelve months. °Ask your doctor or health  care professional about your diet. You may need to lower the amount of salt you eat. °This medicine may affect blood sugar levels. If you have diabetes, check with your doctor or health care professional before you change your diet or the dose of your diabetic medicine. °What side effects may I notice from receiving this medicine? °Side effects that you should report to your doctor or health care professional as soon as possible: °-allergic reactions like skin rash, itching or hives, swelling of the face, lips, or tongue °-changes in emotions or moods °-changes in vision °-depressed mood °-eye pain °-fever or chills, cough, sore throat, pain or difficulty passing urine °-increased thirst °-swelling of ankles, feet °Side effects that usually do not require medical attention (report to your doctor or health care professional if they continue or are bothersome): °-confusion, excitement, restlessness °-headache °-nausea, vomiting °-skin problems, acne, thin and shiny skin °-trouble sleeping °-weight gain °This list may not describe all possible side effects. Call your doctor for medical advice about side effects. You may report side effects to FDA at 1-800-FDA-1088. °Where should I keep my medicine? °Keep out of the reach of children. °Store at room temperature between 15 and 30 degrees C (59 and 86 degrees F). Protect from light. Keep container tightly closed. Throw away any unused medicine after the expiration date. °NOTE: This sheet is a summary. It may not cover all possible information. If you have questions about this medicine, talk to your doctor, pharmacist, or health care provider. °© 2018 [employer] (2011-03-15 10:57:14) ° °

## 2018-04-01 NOTE — Progress Notes (Signed)
Assessment & Plan:  [name] was seen today for follow-up.  Diagnoses and all orders for this visit:  Type 2 diabetes mellitus with hyperglycemia, without long-term current use of insulin (HCC) -     Glucose (CBG) Continue blood sugar control as discussed in office today, low carbohydrate diet, and regular physical exercise as tolerated, 150 minutes per week (30 min each day, 5 days per week, or 50 min 3 days per week). Keep blood sugar logs with fasting goal of 90-130 mg/dl, post prandial (after you eat) less than 180.  For Hypoglycemia: BS <60 and Hyperglycemia BS >400; contact the clinic ASAP. Annual eye exams and foot exams are recommended.  Essential hypertension Continue all antihypertensives as prescribed.  Remember to bring in your blood pressure log with you for your follow up appointment.  DASH/Mediterranean Diets are healthier choices for HTN.    Chronic diastolic CHF (congestive heart failure) ([place]) Follow-up with cardiology as instructed.   Patient has been counseled on age-appropriate routine health concerns for screening and prevention. These are reviewed and up-to-date. Referrals have been placed accordingly. Immunizations are up-to-date or declined.    Subjective:   Chief Complaint  Patient presents with  . Follow-up    Pt. is here to follow-up on diabtes and hypertension.    HPI [name] 42 y.o. female presents to office today for follow up HTN and DM. She was discharged from the hospital on March 15, 2018.  HOSPITAL FOLLOW UP Patient was admitted to the hospital on March 12 2818 with complaints of worsening dyspnea upon exertion, cough, bilateral lower extremity edema, orthopnea, chest pain and weight gain.  She was treated for acute on chronic diastolic CHF secondary to persistent pericarditis and poorly controlled blood pressure.  Due to concern for tamponade she underwent a left heart cath which showed normal heart pressures.  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  She was instructed to take colchicine 0.6 mg twice daily for 2 weeks and to follow-up with cardiology however due to financial issues she was unable to keep her follow-up appointment with cardiology.  In addition she has been unable to make an appointment with rheumatology due to lack of financial resources.  I have strongly urged her to apply for the financial assistance program here and to please inquire at the front desk about the application process for a [place] discount, orange card or other financial assistance.     Type 2 Diabetes Mellitus Disease course has been worsening due to prednisone use for RA. There are no hypoglycemic symptoms. There are no hypoglycemic complications. Symptoms are stable. There are diabetic complications. Risk factors for coronary artery disease include family history, dyslipidemia, diabetes mellitus, obesity,and stress. Current diabetic treatment includes Lantus 120 units daily, Amaryl 2 mg daily, and metformin 1000 mg BID. Patient is compliant with treatment all of the time and monitors blood glucose at home a few times per week.   Home blood glucose trend : (FBS 7 day average 159; 14 day average 143; 21 day average 180m/dl)  Weight is not stable due to steroid use.  Patient follows a generally healthy diet. Meal planning includes avoidance of concentrated sweets. Patient has not seen a dietician. Patient is not compliant with exercise.   An ACE inhibitor/angiotensin II receptor blocker is being taken. Patient does not see a podiatrist. Eye exam is not current.   She is currently taking Crestor 20  mg daily for coronary artery disease. Lab Results  Component Value Date   HGBA1C 9.1 (H) 03/13/2018   CHRONIC HYPERTENSION Disease Monitoring  Blood pressure range BP Readings from Last 3 Encounters:  04/01/18 127/89  03/15/18 (!) 144/90  02/25/18 (!) 152/84    Chest pain: no   Dyspnea: no   Claudication: no  Medication compliance: yes, taking lisinopril 20 mg daily, amlodipine 10 mg daily, Lasix 40 mg twice daily Medication Side Effects  Lightheadedness: no   Urinary frequency: no   Edema: no  Impotence: no  Preventitive Healthcare:  Exercise: no   Diet Pattern: diet: general  Salt Restriction:  yes  Review of Systems  Constitutional: Negative for fever, malaise/fatigue and weight loss.  HENT: Negative.  Negative for nosebleeds.   Eyes: Negative.  Negative for blurred vision, double vision and photophobia.  Respiratory: Positive for shortness of breath (Mild with exertion). Negative for cough and wheezing.   Cardiovascular: Negative.  Negative for chest pain, palpitations and leg swelling.  Gastrointestinal: Negative.  Negative for heartburn, nausea and vomiting.  Musculoskeletal: Negative.  Negative for myalgias.  Neurological: Negative.  Negative for dizziness, focal weakness, seizures and headaches.  Psychiatric/Behavioral: Negative for suicidal ideas. The patient has insomnia (Secondary to prednisone usage).     Past Medical History:  Diagnosis Date  . Abnormal Pap smear of cervix    colpo, HPV  . Allergy   . Anemia   . CHF (congestive heart failure) ([place])   . Depression    after losses  . Enlarged heart    managed by cardiology  . Fibroid   . Gestational diabetes   . Heart murmur   . Hypertension   . Infection    UTI  . Lupus ([place]) 1999    Past Surgical History:  Procedure Laterality Date  . RIGHT/LEFT HEART CATH AND CORONARY ANGIOGRAPHY N/A 03/14/2018   Procedure: RIGHT/LEFT HEART CATH AND CORONARY ANGIOGRAPHY;  Surgeon: [name], MD;  Location: [facility];  Service: Cardiovascular;  Laterality: N/A;  . THERAPEUTIC ABORTION      Family History  Problem Relation Age of Onset  . Cancer Maternal Grandfather        lung  . Diabetes Mother   . Hypertension Maternal Grandmother   . Diabetes  Father   . Hearing loss Neg Hx     Social History Reviewed with no changes to be made today.   Outpatient Medications Prior to Visit  Medication Sig Dispense Refill  . acetaminophen (TYLENOL) 325 MG tablet Take 2 tablets (650 mg total) by mouth every 4 (four) hours as needed for headache or mild pain.    Marland Kitchen amLODipine (NORVASC) 10 MG tablet Take 1 tablet (10 mg total) by mouth daily. 30 tablet 0  . Blood Glucose Monitoring Suppl (TRUE METRIX METER) w/Device KIT Check blood sugars fasting and at bedtime 1 kit 0  . furosemide (LASIX) 40 MG tablet Take 1 tablet (40 mg total) by mouth 2 (two) times daily. 60 tablet 3  . glimepiride (AMARYL) 2 MG tablet Take 1 tablet (2 mg total) by mouth every morning. 30 tablet 11  . glucose blood (TRUE METRIX BLOOD GLUCOSE TEST) test strip Use as instructed 100 each 12  . hydroxychloroquine (PLAQUENIL) 200 MG tablet Take 1 tablet (200 mg total) by mouth 2 (two) times daily. 60 tablet 4  . insulin glargine (LANTUS) 100 UNIT/ML injection Inject 0.12 mLs (12 Units total) into the skin daily. 10 mL 2  .  lisinopril (PRINIVIL,ZESTRIL) 20 MG tablet Take 1 tablet (20 mg total) by mouth daily. 30 tablet 0  . metFORMIN (GLUCOPHAGE) 1000 MG tablet Take 1 tablet (1,000 mg total) by mouth 2 (two) times daily with a meal. 60 tablet 3  . omeprazole (PRILOSEC) 20 MG capsule Take 1 capsule (20 mg total) by mouth daily. 90 capsule 3  . predniSONE (DELTASONE) 20 MG tablet Take 2 tablets (40 mg total) by mouth daily with breakfast. 60 tablet 0  . rosuvastatin (CRESTOR) 20 MG tablet Take 1 tablet (20 mg total) by mouth daily at 6 PM. 30 tablet 0  . TRUEPLUS LANCETS 28G MISC Check blood sugars as directed 100 each 1  . colchicine 0.6 MG tablet Take 1 tablet (0.6 mg total) by mouth 2 (two) times daily for 14 days. 28 tablet 0  . metoprolol tartrate (LOPRESSOR) 50 MG tablet Take 1 tablet (50 mg total) by mouth 2 (two) times daily. 60 tablet 1   No facility-administered medications  prior to visit.     Allergies  Allergen Reactions  . Septra [Bactrim] Hives       Objective:    BP 127/89 (BP Location: Right Arm, Patient Position: Sitting, Cuff Size: Large)   Pulse 84   Temp 98.5 F (36.9 C) (Oral)   Ht _0  (1.651 m)   Wt 220 lb 9.6 oz (100.1 kg)   SpO2 99%   BMI 36.71 kg/m  Wt Readings from Last 3 Encounters:  04/01/18 220 lb 9.6 oz (100.1 kg)  03/15/18 216 lb 9.6 oz (98.2 kg)  02/25/18 221 lb (100.2 kg)    Physical Exam  Constitutional: She is oriented to person, place, and time. She appears well-developed and well-nourished. She is cooperative.  HENT:  Head: Normocephalic and atraumatic.  Eyes: EOM are normal.  Neck: Normal range of motion.  Cardiovascular: Normal rate, regular rhythm and normal heart sounds. Exam reveals no gallop and no friction rub.  No murmur heard. Pulmonary/Chest: Effort normal and breath sounds normal. No tachypnea. No respiratory distress. She has no decreased breath sounds. She has no wheezes. She has no rhonchi. She has no rales. She exhibits no tenderness.  Abdominal: Bowel sounds are normal.  Musculoskeletal: Normal range of motion. She exhibits no edema.  Neurological: She is alert and oriented to person, place, and time. Coordination normal.  Skin: Skin is warm and dry.  Psychiatric: She has a normal mood and affect. Her behavior is normal. Judgment and thought content normal.  Nursing note and vitals reviewed.      Patient has been counseled extensively about nutrition and exercise as well as the importance of adherence with medications and regular follow-up. The patient was given clear instructions to go to ER or return to medical center if symptoms don't improve, worsen or new problems develop. The patient verbalized understanding.   Follow-up: Return in about 6 weeks (around 05/13/2018) for DM/HTN/HPL/review glucometer and current Lantus dosage.   Gildardo Pounds, FNP-BC [facility] and  [place], [place]   04/01/2018, 4:53 PM

## 2018-04-10 ENCOUNTER — Other Ambulatory Visit: Payer: Self-pay

## 2018-04-10 MED ORDER — LISINOPRIL 20 MG PO TABS: 20.0000 mg | ORAL_TABLET | Freq: Every day | ORAL | 0 refills | Status: DC

## 2018-04-10 MED ORDER — PREDNISONE 20 MG PO TABS: 40.0000 mg | ORAL_TABLET | Freq: Every day | ORAL | 0 refills | Status: DC

## 2018-04-10 MED ORDER — ROSUVASTATIN CALCIUM 20 MG PO TABS
20.0000 mg | ORAL_TABLET | Freq: Every day | ORAL | 0 refills | Status: DC
Start: 2018-04-10 — End: 2018-08-22

## 2018-04-15 ENCOUNTER — Ambulatory Visit: Payer: Self-pay

## 2018-04-16 MED FILL — LISINOPRIL 20 MG TAB: 20 | 30 days supply | Qty: 30 | Fill #0

## 2018-04-16 MED FILL — METOPROLOL TARTRATE 50 MG T: 50 | 30 days supply | Qty: 60 | Fill #1

## 2018-04-16 MED FILL — predniSONE 20 MG TABS: 20 | 30 days supply | Qty: 60 | Fill #0

## 2018-04-16 MED FILL — GLIMEPIRIDE 2 MG TABS: 2 | 30 days supply | Qty: 30 | Fill #1

## 2018-04-16 MED FILL — !LANTUS 100 UNITS/ML VIAL: 100 | 28 days supply | Qty: 10 | Fill #1

## 2018-04-22 ENCOUNTER — Ambulatory Visit (INDEPENDENT_AMBULATORY_CARE_PROVIDER_SITE_OTHER): Payer: Medicaid Other | Admitting: *Deleted

## 2018-04-22 VITALS — BP 135/87 | HR 56 | Ht 60.0 in | Wt 224.4 lb

## 2018-04-22 DIAGNOSIS — Z3042 Encounter for surveillance of injectable contraceptive: Secondary | ICD-10-CM

## 2018-04-22 MED ORDER — MEDROXYPROGESTERONE ACETATE 150 MG/ML IM SUSP
150.0000 mg | Freq: Once | INTRAMUSCULAR | Status: AC
  Administered 2018-04-22: 150 mg via INTRAMUSCULAR

## 2018-04-22 NOTE — Progress Notes (Signed)
[name] here for Depo-Provera  Injection.  Injection administered without complication.  Pt tolerated injection well. Patient will return in 3 months for next injection.  Pt instructed to schedule annual exam and PAP smear prior to next depo appointment.  Dolores Hoose, RN 04/22/2018  3:22 PM

## 2018-04-23 NOTE — Progress Notes (Signed)
Chart reviewed for nurse visit. Agree with plan of care.   [place], [place] 04/23/2018 9:09 AM

## 2018-05-12 ENCOUNTER — Inpatient Hospital Stay (HOSPITAL_COMMUNITY)
Admission: EM | Admit: 2018-05-12 | Discharge: 2018-05-14 | DRG: 309 | Disposition: A | Discharge status: Home / Self Care | Payer: Medicaid Other | Attending: Cardiology | Admitting: Cardiology

## 2018-05-12 ENCOUNTER — Encounter (HOSPITAL_COMMUNITY): Payer: Self-pay | Admitting: *Deleted

## 2018-05-12 ENCOUNTER — Emergency Department (HOSPITAL_COMMUNITY): Payer: Medicaid Other

## 2018-05-12 ENCOUNTER — Other Ambulatory Visit: Payer: Self-pay

## 2018-05-12 DIAGNOSIS — Z23 Encounter for immunization: Secondary | ICD-10-CM

## 2018-05-12 DIAGNOSIS — I110 Hypertensive heart disease with heart failure: Secondary | ICD-10-CM | POA: Diagnosis present

## 2018-05-12 DIAGNOSIS — I480 Paroxysmal atrial fibrillation: Secondary | ICD-10-CM | POA: Diagnosis present

## 2018-05-12 DIAGNOSIS — I483 Typical atrial flutter: Principal | ICD-10-CM

## 2018-05-12 DIAGNOSIS — I2510 Atherosclerotic heart disease of native coronary artery without angina pectoris: Secondary | ICD-10-CM | POA: Diagnosis present

## 2018-05-12 DIAGNOSIS — E1165 Type 2 diabetes mellitus with hyperglycemia: Secondary | ICD-10-CM | POA: Diagnosis present

## 2018-05-12 DIAGNOSIS — I4892 Unspecified atrial flutter: Secondary | ICD-10-CM | POA: Diagnosis present

## 2018-05-12 DIAGNOSIS — I272 Pulmonary hypertension, unspecified: Secondary | ICD-10-CM | POA: Diagnosis present

## 2018-05-12 DIAGNOSIS — Z833 Family history of diabetes mellitus: Secondary | ICD-10-CM

## 2018-05-12 DIAGNOSIS — M329 Systemic lupus erythematosus, unspecified: Secondary | ICD-10-CM | POA: Diagnosis present

## 2018-05-12 DIAGNOSIS — Z8249 Family history of ischemic heart disease and other diseases of the circulatory system: Secondary | ICD-10-CM

## 2018-05-12 DIAGNOSIS — Z6841 Body Mass Index (BMI) 40.0 and over, adult: Secondary | ICD-10-CM

## 2018-05-12 DIAGNOSIS — I313 Pericardial effusion (noninflammatory): Secondary | ICD-10-CM | POA: Diagnosis present

## 2018-05-12 DIAGNOSIS — I5032 Chronic diastolic (congestive) heart failure: Secondary | ICD-10-CM | POA: Diagnosis present

## 2018-05-12 LAB — CBC
HCT: 40.6 % (ref 36.0–46.0)
Hemoglobin: 12.6 g/dL (ref 12.0–15.0)
MCH: 25.2 pg — ABNORMAL LOW (ref 26.0–34.0)
MCHC: 31 g/dL (ref 30.0–36.0)
MCV: 81.2 fL (ref 78.0–100.0)
Platelets: 273 10*3/uL (ref 150–400)
RBC: 5 MIL/uL (ref 3.87–5.11)
RDW: 16.6 % — ABNORMAL HIGH (ref 11.5–15.5)
WBC: 7.3 10*3/uL (ref 4.0–10.5)

## 2018-05-12 LAB — BASIC METABOLIC PANEL
Anion gap: 9 (ref 5–15)
BUN: 13 mg/dL (ref 6–20)
CO2: 23 mmol/L (ref 22–32)
Calcium: 8.8 mg/dL — ABNORMAL LOW (ref 8.9–10.3)
Chloride: 107 mmol/L (ref 98–111)
Creatinine, Ser: 0.98 mg/dL (ref 0.44–1.00)
GFR calc Af Amer: >60 mL/min (ref >60)
GFR calc non Af Amer: >60 mL/min (ref >60)
Glucose, Bld: 184 mg/dL — ABNORMAL HIGH (ref 70–99)
Potassium: 3.5 mmol/L (ref 3.5–5.1)
Sodium: 139 mmol/L (ref 135–145)

## 2018-05-12 LAB — TSH: TSH: 0.817 u[IU]/mL (ref 0.350–4.500)

## 2018-05-12 LAB — I-STAT TROPONIN, ED: Troponin i, poc: 0.09 ng/mL (ref 0.00–0.08)

## 2018-05-12 LAB — MRSA PCR SCREENING: MRSA by PCR: NEGATIVE

## 2018-05-12 LAB — BRAIN NATRIURETIC PEPTIDE: B Natriuretic Peptide: 302.7 pg/mL — ABNORMAL HIGH (ref 0.0–100.0)

## 2018-05-12 LAB — GLUCOSE, CAPILLARY: Glucose-Capillary: 202 mg/dL — ABNORMAL HIGH (ref 70–99)

## 2018-05-12 MED ORDER — SODIUM CHLORIDE 0.9% FLUSH: 3.0000 mL | INTRAVENOUS | Status: DC | PRN

## 2018-05-12 MED ORDER — SODIUM CHLORIDE 0.9% FLUSH
3.0000 mL | Freq: Two times a day (BID) | INTRAVENOUS | Status: DC
  Administered 2018-05-12 – 2018-05-14 (×4): 3 mL via INTRAVENOUS

## 2018-05-12 MED ORDER — METOPROLOL TARTRATE 5 MG/5ML IV SOLN: 2.5000 mg | INTRAVENOUS | Status: DC | PRN

## 2018-05-12 MED ORDER — DILTIAZEM LOAD VIA INFUSION
10.0000 mg | Freq: Once | INTRAVENOUS | Status: AC
  Administered 2018-05-12: 10 mg via INTRAVENOUS
  Filled 2018-05-12: qty 10

## 2018-05-12 MED ORDER — METFORMIN HCL 500 MG PO TABS
1000.0000 mg | ORAL_TABLET | Freq: Two times a day (BID) | ORAL | Status: DC
  Administered 2018-05-13 – 2018-05-14 (×3): 1000 mg via ORAL
  Filled 2018-05-12 (×3): qty 2

## 2018-05-12 MED ORDER — INSULIN ASPART 100 UNIT/ML ~~LOC~~ SOLN
0.0000 [IU] | Freq: Every day | SUBCUTANEOUS | Status: DC
  Administered 2018-05-12 – 2018-05-13 (×2): 2 [IU] via SUBCUTANEOUS

## 2018-05-12 MED ORDER — PREDNISONE 20 MG PO TABS
40.0000 mg | ORAL_TABLET | Freq: Every day | ORAL | Status: DC
  Administered 2018-05-13 – 2018-05-14 (×2): 40 mg via ORAL
  Filled 2018-05-12 (×2): qty 2

## 2018-05-12 MED ORDER — ADENOSINE 6 MG/2ML IV SOLN
INTRAVENOUS | Status: AC
  Administered 2018-05-12: 6 mg
  Filled 2018-05-12: qty 6

## 2018-05-12 MED ORDER — ROSUVASTATIN CALCIUM 20 MG PO TABS
20.0000 mg | ORAL_TABLET | Freq: Every day | ORAL | Status: DC
  Administered 2018-05-12 – 2018-05-13 (×2): 20 mg via ORAL
  Filled 2018-05-12 (×2): qty 1

## 2018-05-12 MED ORDER — INSULIN GLARGINE 100 UNIT/ML ~~LOC~~ SOLN
12.0000 [IU] | Freq: Every day | SUBCUTANEOUS | Status: DC
  Administered 2018-05-13 – 2018-05-14 (×3): 12 [IU] via SUBCUTANEOUS
  Filled 2018-05-12 (×5): qty 0.12

## 2018-05-12 MED ORDER — LISINOPRIL 20 MG PO TABS
20.0000 mg | ORAL_TABLET | Freq: Every day | ORAL | Status: DC
  Administered 2018-05-12 – 2018-05-14 (×3): 20 mg via ORAL
  Filled 2018-05-12 (×3): qty 1

## 2018-05-12 MED ORDER — ACETAMINOPHEN 325 MG PO TABS: 650.0000 mg | ORAL_TABLET | ORAL | Status: DC | PRN

## 2018-05-12 MED ORDER — METOPROLOL TARTRATE 5 MG/5ML IV SOLN
5.0000 mg | Freq: Once | INTRAVENOUS | Status: AC
  Administered 2018-05-12: 5 mg via INTRAVENOUS
  Filled 2018-05-12: qty 5

## 2018-05-12 MED ORDER — COLCHICINE 0.6 MG PO TABS
0.6000 mg | ORAL_TABLET | Freq: Two times a day (BID) | ORAL | Status: DC
  Administered 2018-05-12 – 2018-05-14 (×4): 0.6 mg via ORAL
  Filled 2018-05-12 (×4): qty 1

## 2018-05-12 MED ORDER — SODIUM CHLORIDE 0.9 % IV SOLN
INTRAVENOUS | Status: DC
  Administered 2018-05-12: 21:00:00 via INTRAVENOUS

## 2018-05-12 MED ORDER — METOPROLOL TARTRATE 50 MG PO TABS
50.0000 mg | ORAL_TABLET | Freq: Two times a day (BID) | ORAL | Status: DC
  Administered 2018-05-12 – 2018-05-14 (×4): 50 mg via ORAL
  Filled 2018-05-12 (×4): qty 1

## 2018-05-12 MED ORDER — INSULIN ASPART 100 UNIT/ML ~~LOC~~ SOLN
0.0000 [IU] | Freq: Three times a day (TID) | SUBCUTANEOUS | Status: DC
  Administered 2018-05-13: 11 [IU] via SUBCUTANEOUS
  Administered 2018-05-14: 4 [IU] via SUBCUTANEOUS

## 2018-05-12 MED ORDER — DILTIAZEM HCL-DEXTROSE 100-5 MG/100ML-% IV SOLN (PREMIX)
5.0000 mg/h | INTRAVENOUS | Status: DC
  Administered 2018-05-12: 15 mg/h via INTRAVENOUS
  Administered 2018-05-12: 10 mg/h via INTRAVENOUS
  Administered 2018-05-12 – 2018-05-13 (×2): 5 mg/h via INTRAVENOUS
  Filled 2018-05-12 (×2): qty 100

## 2018-05-12 MED ORDER — INFLUENZA VAC SPLIT QUAD 0.5 ML IM SUSY
0.5000 mL | PREFILLED_SYRINGE | INTRAMUSCULAR | Status: AC | PRN
  Administered 2018-05-14: 0.5 mL via INTRAMUSCULAR
  Filled 2018-05-12: qty 0.5

## 2018-05-12 MED ORDER — GLIMEPIRIDE 2 MG PO TABS
2.0000 mg | ORAL_TABLET | ORAL | Status: DC
  Administered 2018-05-13 – 2018-05-14 (×2): 2 mg via ORAL
  Filled 2018-05-12: qty 2
  Filled 2018-05-12: qty 1
  Filled 2018-05-12: qty 2
  Filled 2018-05-12: qty 1

## 2018-05-12 MED ORDER — FUROSEMIDE 40 MG PO TABS
40.0000 mg | ORAL_TABLET | Freq: Two times a day (BID) | ORAL | Status: DC
  Administered 2018-05-12 – 2018-05-14 (×4): 40 mg via ORAL
  Filled 2018-05-12 (×4): qty 1

## 2018-05-12 MED ORDER — PNEUMOCOCCAL VAC POLYVALENT 25 MCG/0.5ML IJ INJ
0.5000 mL | INJECTION | INTRAMUSCULAR | Status: AC | PRN
  Administered 2018-05-14: 0.5 mL via INTRAMUSCULAR
  Filled 2018-05-12 (×2): qty 0.5

## 2018-05-12 MED ORDER — HYDROXYCHLOROQUINE SULFATE 200 MG PO TABS
200.0000 mg | ORAL_TABLET | Freq: Two times a day (BID) | ORAL | Status: DC
  Administered 2018-05-12 – 2018-05-14 (×4): 200 mg via ORAL
  Filled 2018-05-12 (×4): qty 1

## 2018-05-12 MED ORDER — SODIUM CHLORIDE 0.9 % IV SOLN: 250.0000 mL | INTRAVENOUS | Status: DC

## 2018-05-12 MED ORDER — ONDANSETRON HCL 4 MG/2ML IJ SOLN: 4.0000 mg | Freq: Four times a day (QID) | INTRAMUSCULAR | Status: DC | PRN

## 2018-05-12 MED ORDER — PANTOPRAZOLE SODIUM 40 MG PO TBEC
40.0000 mg | DELAYED_RELEASE_TABLET | Freq: Every day | ORAL | Status: DC
  Administered 2018-05-12 – 2018-05-14 (×3): 40 mg via ORAL
  Filled 2018-05-12 (×3): qty 1

## 2018-05-12 MED ORDER — HYDROCORTISONE NA SUCCINATE PF 100 MG IJ SOLR
100.0000 mg | Freq: Once | INTRAMUSCULAR | Status: AC
  Administered 2018-05-12: 100 mg via INTRAVENOUS
  Filled 2018-05-12: qty 2

## 2018-05-12 MED ORDER — APIXABAN 5 MG PO TABS
5.0000 mg | ORAL_TABLET | Freq: Two times a day (BID) | ORAL | Status: DC
  Administered 2018-05-12 – 2018-05-14 (×4): 5 mg via ORAL
  Filled 2018-05-12 (×4): qty 1

## 2018-05-12 MED ORDER — SOTALOL HCL 80 MG PO TABS
80.0000 mg | ORAL_TABLET | Freq: Two times a day (BID) | ORAL | Status: DC
  Administered 2018-05-12 – 2018-05-14 (×4): 80 mg via ORAL
  Filled 2018-05-12 (×5): qty 1

## 2018-05-12 NOTE — ED Notes (Signed)
Patient c/o sob for several days, didn't realize her heart rate was elevated.

## 2018-05-12 NOTE — ED Notes (Signed)
Critical I-stat troponin values given to Claiborne Billings, RN and Ashok Cordia, MD

## 2018-05-12 NOTE — ED Notes (Signed)
IP cardiology paged for orders for admission.

## 2018-05-12 NOTE — H&P (Addendum)
AMERIKA NOURSE is an 42 y.o. female.   Chief Complaint: Fatigue HPI: MIAYA LAFONTANT  is a 42 y.o. female  With morbid obesity, chronic pericardial effusion, history of SLE, hypertension, uncontrolled type 2 diabetes mellitus, moderate coronary artery disease in the form of 50% stenosis in the proximal LAD by coronary angiography in August 2019 and mild pulmonary hypertension, who has had multiple admissions and ED evaluations in the past was seen by [place] on 04/30/2018 and was found to be in new onset of atrial flutter with variable AV conduction and was started on Eliquis after long discussion regarding risks and benefits especially in view of pericardial effusion, beta-blocker was continued as her rate was controlled.  However she presented to the emergency room today complaining of marked fatigue and generalized weakness and shortness of breath and was found to be in atrial flutter with RVR, initially thought to be PSVT and intravenous adenosine was administered revealing flutter waves.  I was asked to see the patient and further management.  Past Medical History:  Diagnosis Date  . Abnormal Pap smear of cervix    colpo, HPV  . Allergy   . Anemia   . CHF (congestive heart failure) ([place])   . Depression    after losses  . Enlarged heart    managed by cardiology  . Fibroid   . Gestational diabetes   . Heart murmur   . Hypertension   . Infection    UTI  . Lupus ([place]) 1999    Past Surgical History:  Procedure Laterality Date  . RIGHT/LEFT HEART CATH AND CORONARY ANGIOGRAPHY N/A 03/14/2018   Procedure: RIGHT/LEFT HEART CATH AND CORONARY ANGIOGRAPHY;  Surgeon: [name], MD;  Location: [facility];  Service: Cardiovascular;  Laterality: N/A;  . THERAPEUTIC ABORTION      Family History  Problem Relation Age of Onset  . Cancer Maternal Grandfather        lung  . Diabetes Mother   . Hypertension Maternal Grandmother   . Diabetes Father   . Hearing loss Neg Hx     Social History:  reports that she has never smoked. She has never used smokeless tobacco. She reports that she drinks alcohol. She reports that she does not use drugs.  Allergies:  Allergies  Allergen Reactions  . Septra [Bactrim] Hives   Review of Systems  Constitutional: Positive for malaise/fatigue. Negative for chills, fever and weight loss.  HENT: Negative.   Eyes: Negative.   Respiratory: Positive for shortness of breath. Negative for sputum production and wheezing.   Cardiovascular: Negative.   Gastrointestinal: Negative.   Genitourinary: Negative.   Musculoskeletal: Negative.   Skin: Negative.   All other systems reviewed and are negative.   Blood pressure 107/78, pulse (!) 39, temperature 98.4 F (36.9 C), temperature source Oral, resp. rate 14, height 5' (1.524 m), weight 101 kg, SpO2 (!) 72 %. Body mass index is 43.49 kg/m. Physical Exam  Constitutional: She is oriented to person, place, and time. She appears well-developed and well-nourished. No distress.  Morbidly obese  Eyes: Conjunctivae are normal.  Neck:  Shoirt neck and difficult to evaluate the JVD. No lymphadenopathy, no obvious thyromegaly  Cardiovascular: Regular rhythm and intact distal pulses. Tachycardia present. Exam reveals distant heart sounds.  No murmur heard. Pulses:      Carotid pulses are 3+ on the right side, and 3+ on the left side. Femoral pulse and popliteal pulse could not be felt due to obesity  Pulmonary/Chest: Effort normal and breath sounds normal.  Abdominal: Soft. Bowel sounds are normal.  Large pannus present  Musculoskeletal: Normal range of motion. She exhibits no edema, tenderness or deformity.  Neurological: She is alert and oriented to person, place, and time.  Skin: Skin is warm and dry.  Psychiatric: She has a normal mood and affect.     Results for orders placed or performed during the hospital encounter of 05/12/18 (from the past 48 hour(s))  I-stat troponin, ED      Status: Abnormal   Collection Time: 05/12/18 10:03 AM  Result Value Ref Range   Troponin i, poc 0.09 (HH) 0.00 - 0.08 ng/mL   Comment NOTIFIED PHYSICIAN    Comment 3            Comment: Due to the release kinetics of cTnI, a negative result within the first hours of the onset of symptoms does not rule out myocardial infarction with certainty. If myocardial infarction is still suspected, repeat the test at appropriate intervals.     Labs:   Lab Results  Component Value Date   WBC 8.0 03/15/2018   HGB 12.3 03/15/2018   HCT 38.8 03/15/2018   MCV 77.1 (L) 03/15/2018   PLT 339 03/15/2018    BNP (last 3 results) Recent Labs    01/16/18 1326 03/12/18 2155 03/13/18 0218  BNP 180.2* 180.7* 229.2*    HEMOGLOBIN A1C Lab Results  Component Value Date   HGBA1C 9.1 (H) 03/13/2018   MPG 214.47 03/13/2018   Cardiac Panel (last 3 results) Recent Labs    03/13/18 0952 03/13/18 1543 03/13/18 2045  TROPONINI 0.03* 0.03* 0.03*    TSH Recent Labs    12/25/17 0206 03/13/18 0218  TSH 5.026* 1.041   Current Facility-Administered Medications:  .  [COMPLETED] diltiazem (CARDIZEM) 1 mg/mL load via infusion 10 mg, 10 mg, Intravenous, Once, 10 mg at 05/12/18 1011 **AND** diltiazem (CARDIZEM) 100 mg in dextrose 5% 177m (1 mg/mL) infusion, 5-15 mg/hr, Intravenous, Continuous, SLajean Saver MD, Last Rate: 10 mL/hr at 05/12/18 1043, 10 mg/hr at 05/12/18 1043 .  hydrocortisone sodium succinate (SOLU-CORTEF) 100 MG injection 100 mg, 100 mg, Intravenous, Once, SLajean Saver MD  Current Outpatient Medications:  .  acetaminophen (TYLENOL) 325 MG tablet, Take 2 tablets (650 mg total) by mouth every 4 (four) hours as needed for headache or mild pain., Disp: , Rfl:  .  amLODipine (NORVASC) 10 MG tablet, Take 1 tablet (10 mg total) by mouth daily., Disp: 30 tablet, Rfl: 0 .  colchicine 0.6 MG tablet, Take 1 tablet (0.6 mg total) by mouth 2 (two) times daily for 14 days., Disp: 28 tablet,  Rfl: 0 .  furosemide (LASIX) 40 MG tablet, Take 1 tablet (40 mg total) by mouth 2 (two) times daily., Disp: 60 tablet, Rfl: 3 .  glimepiride (AMARYL) 2 MG tablet, Take 1 tablet (2 mg total) by mouth every morning., Disp: 30 tablet, Rfl: 11 .  hydroxychloroquine (PLAQUENIL) 200 MG tablet, Take 1 tablet (200 mg total) by mouth 2 (two) times daily., Disp: 60 tablet, Rfl: 4 .  insulin glargine (LANTUS) 100 UNIT/ML injection, Inject 0.12 mLs (12 Units total) into the skin daily., Disp: 10 mL, Rfl: 2 .  lisinopril (PRINIVIL,ZESTRIL) 20 MG tablet, Take 1 tablet (20 mg total) by mouth daily., Disp: 90 tablet, Rfl: 0 .  metFORMIN (GLUCOPHAGE) 1000 MG tablet, Take 1 tablet (1,000 mg total) by mouth 2 (two) times daily with a meal., Disp: 60 tablet, Rfl: 3 .  metoprolol tartrate (LOPRESSOR) 50 MG tablet, Take 1 tablet (50 mg total) by mouth 2 (two) times daily., Disp: 60 tablet, Rfl: 1 .  omeprazole (PRILOSEC) 20 MG capsule, Take 1 capsule (20 mg total) by mouth daily., Disp: 90 capsule, Rfl: 3 .  predniSONE (DELTASONE) 20 MG tablet, Take 2 tablets (40 mg total) by mouth daily with breakfast., Disp: 60 tablet, Rfl: 0 .  rosuvastatin (CRESTOR) 20 MG tablet, Take 1 tablet (20 mg total) by mouth daily at 6 PM., Disp: 90 tablet, Rfl: 0 .  Blood Glucose Monitoring Suppl (TRUE METRIX METER) w/Device KIT, Check blood sugars fasting and at bedtime, Disp: 1 kit, Rfl: 0 .  glucose blood (TRUE METRIX BLOOD GLUCOSE TEST) test strip, Use as instructed, Disp: 100 each, Rfl: 12 .  TRUEPLUS LANCETS 28G MISC, Check blood sugars as directed, Disp: 100 each, Rfl: 1  CARDIAC STUDIES: Echocardiogram 03/13/2018: Normal LV size, moderate LVH, EF 55 to 34%, grade 1 diastolic dysfunction, mild aortic stenosis with trace aortic regurgitation.  Severely dilated left atrium. Moderate circumferential pericardial effusion, no significant change from 12/30/2017 and 01/30/2018.  Normal right atrial pressure.  No hemodynamic  consequence.  EKG 05/12/2018: Typical atrial flutter with rapid ventricular response.  Ventricular rate 169 bpm.  No ST-T wave changes of ischemia.  Coronary angiogram 03/14/2018: LM: Normal LAD: Prox focal 50% stenosis LCx: Normal RCA: Normal  Borderline pulmonary hypertension (mean PA 20-24 mmHg) Invasive hemodynamic findings do not suggest tamponade or constriction physiology.  Assessment/Plan 1.  Paroxysmal atrial flutter with variable AV conduction CHA2DS2-VASCScore: Risk Score  3,  Yearly risk of stroke  3.2. Recommendation: ASA No/Anticoagulation Yes 2.  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 3.  Type 2 diabetes mellitus uncontrolled with hyperglycemia 4.  Hypertension 5.  Moderate coronary artery disease, proximal LAD 50% stenosis by angiography on 03/14/2018 6.  Morbid obesity  Recommendation: Will admit the patient to step down and start IV metoprolol and continue IV dilt. Will have EP consult and consider ablation in view of her multiple medical issues, prefer not to add more meds.  May need TEE guided cardioversion as anticoagulation was started just 1 week ago.  I have again discussed regarding DM control, weight loss.   I have EP on board it was felt she is not a good candidate for ablation in view of her comorbidity. Prefer antiarrhythmic therapy and TEE guided Cardioversion. I will start Sotalol today and schedule for TEE/Cardioversion tomorrow. I will avoid Amiodarone in view of her complex connective tissue disorder and interaction with multiple medications and long term side effects.    Adrian Prows, MD 05/12/2018, 11:07 AM [facility] [place] Pager: [phone] Office: [phone] If no answer: Cell:  [phone]

## 2018-05-12 NOTE — Consult Note (Addendum)
Cardiology Consultation:   Patient ID: [name] MRN: [id number]; DOB: 20-Aug-1975  Admit date: 05/12/2018 Date of Consult: 05/12/2018  Primary Care Provider: Gildardo Pounds, NP Primary Cardiologist: Dr. Virgina Jock Primary Electrophysiologist:  None    Patient Profile:   [name] is a 42 y.o. female with a hx of HTN, DM, morbid obesity, lupus (SLE on plaquenil), with recurrent moderate pericardial effusion, and more recently new AFlutter, who is being seen today for the evaluation of AFlutter at the request of Dr. Einar Gip.  History of Present Illness:   Ms. Haacke was found to have new Aflutter 04/30/18, Dr. Einar Gip notes, she was started on Eliquis after long discussion regarding risks and benefits especially in view of pericardial effusion, beta-blocker was continued as her rate was controlled.  She returns to [facility] ER with c/o increasing SOB, initially thought to be in SVT given adenosine, noted to be in rapid Aflutter.  Cardiology was called, she was started on diltiazem gtt for rate control given new to a/c.  LABS K+ 3.5 BUN/Creat 13/0.98 BNP 302 poc Trop 0.09 WBC 7.3 H/H 12/40 Plts 273  She has no rest SOB, no CP, some palpitations.  BP is stable, rates 110's on dilt gtt.  I do not see Eliquis on her home meds list though she confirms she was taking every day via samples from Dr. Virgina Jock last week, none so far today.  Past Medical History:  Diagnosis Date  . Abnormal Pap smear of cervix    colpo, HPV  . Allergy   . Anemia   . CHF (congestive heart failure) ([place])   . Depression    after losses  . Enlarged heart    managed by cardiology  . Fibroid   . Gestational diabetes   . Heart murmur   . Hypertension   . Infection    UTI  . Lupus ([place]) 1999    Past Surgical History:  Procedure Laterality Date  . RIGHT/LEFT HEART CATH AND CORONARY ANGIOGRAPHY N/A 03/14/2018   Procedure: RIGHT/LEFT HEART CATH AND CORONARY ANGIOGRAPHY;  Surgeon: [name], MD;  Location: [facility];  Service: Cardiovascular;  Laterality: N/A;  . THERAPEUTIC ABORTION       Home Medications:  Prior to Admission medications   Medication Sig Start Date End Date Taking? Authorizing Provider  acetaminophen (TYLENOL) 325 MG tablet Take 2 tablets (650 mg total) by mouth every 4 (four) hours as needed for headache or mild pain. 12/28/17  Yes Georgette Shell, MD  amLODipine (NORVASC) 10 MG tablet Take 1 tablet (10 mg total) by mouth daily. 03/31/12  Yes West, Emily, PA-C  colchicine 0.6 MG tablet Take 1 tablet (0.6 mg total) by mouth 2 (two) times daily for 14 days. 03/15/18 05/12/18 Yes Danford, Suann Larry, MD  furosemide (LASIX) 40 MG tablet Take 1 tablet (40 mg total) by mouth 2 (two) times daily. 01/18/18  Yes Rai, Ripudeep K, MD  glimepiride (AMARYL) 2 MG tablet Take 1 tablet (2 mg total) by mouth every morning. 02/25/18 02/25/19 Yes Gildardo Pounds, NP  hydroxychloroquine (PLAQUENIL) 200 MG tablet Take 1 tablet (200 mg total) by mouth 2 (two) times daily. 01/18/18  Yes Rai, Ripudeep K, MD  insulin glargine (LANTUS) 100 UNIT/ML injection Inject 0.12 mLs (12 Units total) into the skin daily. 03/15/18  Yes Danford, Suann Larry, MD  lisinopril (PRINIVIL,ZESTRIL) 20 MG tablet Take 1 tablet (20 mg total) by mouth daily. 04/10/18  Yes Gildardo Pounds, NP  metFORMIN (GLUCOPHAGE) 1000 MG tablet Take 1 tablet (1,000 mg total) by mouth 2 (two) times daily with a meal. 01/18/18  Yes Rai, Ripudeep K, MD  metoprolol tartrate (LOPRESSOR) 50 MG tablet Take 1 tablet (50 mg total) by mouth 2 (two) times daily. 02/25/18 05/12/18 Yes Gildardo Pounds, NP  omeprazole (PRILOSEC) 20 MG capsule Take 1 capsule (20 mg total) by mouth daily. 02/25/18 06/25/18 Yes Gildardo Pounds, NP  predniSONE (DELTASONE) 20 MG tablet Take 2 tablets (40 mg total) by mouth daily with breakfast. 04/10/18  Yes Gildardo Pounds, NP  rosuvastatin (CRESTOR) 20 MG tablet Take 1 tablet (20 mg total) by mouth  daily at 6 PM. 04/10/18  Yes Gildardo Pounds, NP  Blood Glucose Monitoring Suppl (TRUE METRIX METER) w/Device KIT Check blood sugars fasting and at bedtime 01/30/18   Argentina Donovan, PA-C  glucose blood (TRUE METRIX BLOOD GLUCOSE TEST) test strip Use as instructed 01/30/18   Argentina Donovan, PA-C  TRUEPLUS LANCETS 28G MISC Check blood sugars as directed 01/30/18   Argentina Donovan, PA-C    Inpatient Medications: Scheduled Meds: . hydrocortisone sod succinate (SOLU-CORTEF) inj  100 mg Intravenous Once   Continuous Infusions: . diltiazem (CARDIZEM) infusion 10 mg/hr (05/12/18 1043)   PRN Meds:   Allergies:    Allergies  Allergen Reactions  . Septra [Bactrim] Hives    Social History:   Social History   Socioeconomic History  . Marital status: Married    Spouse name: Not on file  . Number of children: Not on file  . Years of education: Not on file  . Highest education level: Not on file  Occupational History  . Not on file  Social Needs  . Financial resource strain: Not on file  . Food insecurity:    Worry: Patient refused    Inability: Patient refused  . Transportation needs:    Medical: Patient refused    Non-medical: Patient refused  Tobacco Use  . Smoking status: Never Smoker  . Smokeless tobacco: Never Used  Substance and Sexual Activity  . Alcohol use: Yes    Comment: ocasionally   . Drug use: No  . Sexual activity: Yes    Birth control/protection: Injection  Lifestyle  . Physical activity:    Days per week: Patient refused    Minutes per session: Patient refused  . Stress: Not on file  Relationships  . Social connections:    Talks on phone: Patient refused    Gets together: Patient refused    Attends religious service: Patient refused    Active member of club or organization: Patient refused    Attends meetings of clubs or organizations: Patient refused    Relationship status: Patient refused  . Intimate partner violence:    Fear of current or ex  partner: Patient refused    Emotionally abused: Patient refused    Physically abused: Patient refused    Forced sexual activity: Patient refused  Other Topics Concern  . Not on file  Social History Narrative  . Not on file    Family History:   Family History  Problem Relation Age of Onset  . Cancer Maternal Grandfather        lung  . Diabetes Mother   . Hypertension Maternal Grandmother   . Diabetes Father   . Hearing loss Neg Hx      ROS:  Please see the history of present illness.  All other ROS reviewed and negative.  Physical Exam/Data:   Vitals:   05/12/18 1017 05/12/18 1022 05/12/18 1030 05/12/18 1045  BP: 108/80  105/64 107/78  Pulse: (!) 171  (!) 39   Resp: 18  19 14   Temp:      TempSrc:      SpO2:  95% (!) 72%   Weight:      Height:       No intake or output data in the 24 hours ending 05/12/18 1503 Filed Weights   05/12/18 0951  Weight: 101 kg   Body mass index is 43.49 kg/m.  General:  Well nourished, well developed, in no acute distress HEENT: normal Lymph: no adenopathy Neck: no obvious JVD Endocrine:  No thryomegaly Vascular: No carotid bruits Cardiac:  irreg-irreg, tachycardic; not distant, no murmurs, gallops appreciate, I do not hear a rub Lungs:  CTA b/l, no wheezing, rhonchi or rales  Abd: soft, nontender, obese  Ext: no edema Musculoskeletal:  No deformities Skin: warm and dry  Neuro:   No gross focal abnormalities noted Psych:  Normal affect   EKG:  The EKG was personally reviewed and demonstrates:   AFlutter 171bpm AFlutter 169bpm AFlutter 166bpm Telemetry:  Telemetry was personally reviewed and demonstrates:   AFlutter 110-120 currently  Relevant CV Studies:  03/14/18; LHC LM: Normal LAD: Prox focal 50% stenosis LCx: Normal RCA: Normal  Borderline pulmonary hypertension (mean PA 20-24 mmHg) Invasive hemodynamic findings do not suggest tamponade or constriction physiology.  03/13/18: TTE Study Conclusions - Left  ventricle: There was moderate concentric hypertrophy. The   estimated ejection fraction was in the range of 55% to 60%. Wall   motion was normal; there were no regional wall motion   abnormalities. Doppler parameters are consistent with abnormal   left ventricular relaxation (grade 1 diastolic dysfunction). - Aortic valve: There was mild stenosis. There was trivial   regurgitation. - Left atrium: The atrium was severely dilated. - Pericardium, extracardiac: Moderate circumferential pericardial   effusion, most prominent adjacent to right atrium. No significant   change compared to previous studies in 12/2017 and 01/2018.   Normal right atrial pressure makes constrictive pericarditis   unlikely. No evidence of hemodynamic compromise. - No significant change compared to previous studies in 12/2017 and   01/2018.  Laboratory Data:  Chemistry Recent Labs  Lab 05/12/18 1002  NA 139  K 3.5  CL 107  CO2 23  GLUCOSE 184*  BUN 13  CREATININE 0.98  CALCIUM 8.8*  GFRNONAA >60  GFRAA >60  ANIONGAP 9    No results for input(s): PROT, ALBUMIN, AST, ALT, ALKPHOS, BILITOT in the last 168 hours. Hematology Recent Labs  Lab 05/12/18 1002  WBC 7.3  RBC 5.00  HGB 12.6  HCT 40.6  MCV 81.2  MCH 25.2*  MCHC 31.0  RDW 16.6*  PLT 273   Cardiac EnzymesNo results for input(s): TROPONINI in the last 168 hours.  Recent Labs  Lab 05/12/18 1003  TROPIPOC 0.09*    BNP Recent Labs  Lab 05/12/18 1002  BNP 302.7*    DDimer No results for input(s): DDIMER in the last 168 hours.  Radiology/Studies:   Dg Chest Port 1 View Result Date: 05/12/2018 CLINICAL DATA:  Shortness of Breath EXAM: PORTABLE CHEST 1 VIEW COMPARISON:  March 12, 2018 FINDINGS: There is no edema or consolidation. There is cardiomegaly with pulmonary vascularity normal. No adenopathy. No bone lesions. No pneumothorax. IMPRESSION: Cardiomegaly, stable.  No edema or consolidation. Electronically Signed   By: Lowella Grip III  M.D.   On: 05/12/2018 10:14    Assessment and Plan:   1. Aflutter, looks typical     CHA2DS2Vasc is 3, she was given samples of Eliquis last week at Dr. Bonney Roussel office  Agree with diltiazem gtt/rate control Consider TEE/DCCV  Dr. Lovena Le will see, and review, her AFlutter looks typical, perhaps an ablation out patient vs AAD  2. SLE     On plequenil 3. Chronic/recurrent pericardial effusion     BP stable     For questions or updates, please contact [facility] Please consult [email or web address] for contact info under     Signed, Baldwin Jamaica, PA-C  05/12/2018 3:03 PM  EP Attending  Patient seen and examined. Agree with above. The patient is a morbidly obese 42 yo woman with multiple medical problems including SLE and a pericardial effusion. She is morbidly obese and has HTN and DM. She presented with symptomatic atrial flutter. I cannot find any ECG with her in atrial fibrillation. She has fairly rapid atrial flutter. Her VR has been difficult to control. It is unclear to me exactly how long she has been on systemic anti-coagulation but I do not think it has been 3 weeks. Her morbid obesity makes catheter ablation as well as amiodarone less than ideal. I will discuss the history with Dr. Einar Gip. Her morbid obesity makes ablation with general anesthesia a requirement. I think TEE/DCCV in the short term along with some amidarone makes the most sensed in the short term. Her ventricular rates would be very difficult to control with oral drugs alone.   Mikle Bosworth.D.

## 2018-05-12 NOTE — ED Notes (Signed)
Cardiology PA at bedside- she will call Dr. Einar Gip to place orders

## 2018-05-12 NOTE — ED Notes (Signed)
Report received from [facility], Therapist, sports. Pt stable, denies pain, no shortness of breath at present time.

## 2018-05-12 NOTE — ED Provider Notes (Signed)
[facility] EMERGENCY DEPARTMENT Provider Note   CSN: [id number] Arrival date & time: 05/12/18  6269     History   Chief Complaint Chief Complaint  Patient presents with  . Shortness of Breath    HPI [name] is a 41 y.o. female.  Patient with hx lupus, chf, presents c/o generally weakness, and sob/doe for the past 2-3 days. Symptoms gradual onset, moderate, persistently, slowly worsening. Indicates saw her cardiologist this past week and was told had rhythm problem with heart and was placed on eliquis - ? New onset afib then (no records in chart). Pt denies palpitations, although heart rate is in 160-170 range. Denies prior hx dysrhythmia, svt or afib. States compliant w normal meds.  Mild foot/ankle swelling bilaterally. No chest pain. No fever or chills.  The history is provided by the patient.  Shortness of Breath  Pertinent negatives include no fever, no headaches, no sore throat, no neck pain, no cough, no chest pain, no vomiting, no abdominal pain and no rash.    Past Medical History:  Diagnosis Date  . Abnormal Pap smear of cervix    colpo, HPV  . Allergy   . Anemia   . CHF (congestive heart failure) ([place])   . Depression    after losses  . Enlarged heart    managed by cardiology  . Fibroid   . Gestational diabetes   . Heart murmur   . Hypertension   . Infection    UTI  . Lupus ([place]) 1999    Patient Active Problem List   Diagnosis Date Noted  . Acute on chronic diastolic CHF (congestive heart failure) ([place]) 03/12/2018  . Pericardial effusion 01/16/2018  . Pleuritic chest pain 01/16/2018  . Chronic diastolic CHF (congestive heart failure) ([place]) 01/16/2018  . 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 Leucopenia   . Exertional dyspnea 12/25/2017  . Lower extremity edema 12/25/2017  . Hypertension 12/25/2017  . Microcytic anemia 12/25/2017  . Diabetes ([place]) 12/25/2017  . Chronic pain of both knees 04/23/2017  . Fibroids 01/20/2015   . Menorrhagia 01/20/2015  . Low grade squamous intraepithelial lesion (LGSIL) on cervical Pap smear on 01/20/15 01/20/2015  . Atypical glandular cells on cervical Pap smear on 01/20/15 01/20/2015  . Lupus ([place]) 07/15/2013    Past Surgical History:  Procedure Laterality Date  . RIGHT/LEFT HEART CATH AND CORONARY ANGIOGRAPHY N/A 03/14/2018   Procedure: RIGHT/LEFT HEART CATH AND CORONARY ANGIOGRAPHY;  Surgeon: [name], MD;  Location: [facility];  Service: Cardiovascular;  Laterality: N/A;  . THERAPEUTIC ABORTION       OB History    Gravida  5   Para  2   Term  0   Preterm  2   AB  3   Living  2     SAB  2   TAB  1   Ectopic  0   Multiple  0   Live Births  2            Home Medications    Prior to Admission medications   Medication Sig Start Date End Date Taking? Authorizing Provider  acetaminophen (TYLENOL) 325 MG tablet Take 2 tablets (650 mg total) by mouth every 4 (four) hours as needed for headache or mild pain. 12/28/17   Georgette Shell, MD  amLODipine (NORVASC) 10 MG tablet Take 1 tablet (10 mg total) by mouth daily. 03/31/12   Clayton Bibles, PA-C  Blood Glucose Monitoring Suppl (TRUE  METRIX METER) w/Device KIT Check blood sugars fasting and at bedtime 01/30/18   Argentina Donovan, PA-C  colchicine 0.6 MG tablet Take 1 tablet (0.6 mg total) by mouth 2 (two) times daily for 14 days. 03/15/18 03/29/18  Danford, Suann Larry, MD  furosemide (LASIX) 40 MG tablet Take 1 tablet (40 mg total) by mouth 2 (two) times daily. 01/18/18   Rai, Ripudeep K, MD  glimepiride (AMARYL) 2 MG tablet Take 1 tablet (2 mg total) by mouth every morning. 02/25/18 02/25/19  Gildardo Pounds, NP  glucose blood (TRUE METRIX BLOOD GLUCOSE TEST) test strip Use as instructed 01/30/18   Argentina Donovan, PA-C  hydroxychloroquine (PLAQUENIL) 200 MG tablet Take 1 tablet (200 mg total) by mouth 2 (two) times daily. 01/18/18   Rai, Ripudeep K, MD  insulin glargine (LANTUS) 100 UNIT/ML  injection Inject 0.12 mLs (12 Units total) into the skin daily. 03/15/18   Danford, Suann Larry, MD  lisinopril (PRINIVIL,ZESTRIL) 20 MG tablet Take 1 tablet (20 mg total) by mouth daily. 04/10/18   Gildardo Pounds, NP  metFORMIN (GLUCOPHAGE) 1000 MG tablet Take 1 tablet (1,000 mg total) by mouth 2 (two) times daily with a meal. 01/18/18   Rai, Ripudeep K, MD  metoprolol tartrate (LOPRESSOR) 50 MG tablet Take 1 tablet (50 mg total) by mouth 2 (two) times daily. 02/25/18 03/27/18  Gildardo Pounds, NP  omeprazole (PRILOSEC) 20 MG capsule Take 1 capsule (20 mg total) by mouth daily. 02/25/18 06/25/18  Gildardo Pounds, NP  predniSONE (DELTASONE) 20 MG tablet Take 2 tablets (40 mg total) by mouth daily with breakfast. 04/10/18   Gildardo Pounds, NP  rosuvastatin (CRESTOR) 20 MG tablet Take 1 tablet (20 mg total) by mouth daily at 6 PM. 04/10/18   Gildardo Pounds, NP  TRUEPLUS LANCETS 28G MISC Check blood sugars as directed 01/30/18   Argentina Donovan, PA-C    Family History Family History  Problem Relation Age of Onset  . Cancer Maternal Grandfather        lung  . Diabetes Mother   . Hypertension Maternal Grandmother   . Diabetes Father   . Hearing loss Neg Hx     Social History Social History   Tobacco Use  . Smoking status: Never Smoker  . Smokeless tobacco: Never Used  Substance Use Topics  . Alcohol use: Yes    Comment: ocasionally   . Drug use: No     Allergies   Septra [bactrim]   Review of Systems Review of Systems  Constitutional: Negative for fever.  HENT: Negative for sore throat.   Eyes: Negative for redness.  Respiratory: Positive for shortness of breath. Negative for cough.   Cardiovascular: Negative for chest pain.  Gastrointestinal: Negative for abdominal pain and vomiting.  Genitourinary: Negative for flank pain.  Musculoskeletal: Negative for neck pain.  Skin: Negative for rash.  Neurological: Negative for headaches.  Hematological: Does not bruise/bleed  easily.  Psychiatric/Behavioral: Negative for confusion.     Physical Exam Updated Vital Signs BP (!) 135/107 (BP Location: Left Arm)   Pulse (!) 168   Temp 98.4 F (36.9 C) (Oral)   Resp 18   Ht 1.524 m (5')   Wt 101 kg   BMI 43.49 kg/m   Physical Exam  Constitutional: She appears well-developed and well-nourished.  Tachycardic.   HENT:  Mouth/Throat: Oropharynx is clear and moist.  Eyes: Conjunctivae are normal. No scleral icterus.  Neck: Neck supple. No tracheal deviation present.  No thyromegaly present.  Cardiovascular: Normal rate, regular rhythm, normal heart sounds and intact distal pulses.  Tachycardic.   Pulmonary/Chest: Effort normal. No respiratory distress.  Abdominal: Soft. Normal appearance and bowel sounds are normal. She exhibits no distension. There is no tenderness.  Musculoskeletal:  Mild ankle/lower leg edema.   Neurological: She is alert.  Skin: Skin is warm and dry. No rash noted.  Psychiatric: She has a normal mood and affect.  Nursing note and vitals reviewed.    ED Treatments / Results  Labs (all labs ordered are listed, but only abnormal results are displayed) Results for orders placed or performed during the hospital encounter of 05/12/18  CBC  Result Value Ref Range   WBC 7.3 4.0 - 10.5 K/uL   RBC 5.00 3.87 - 5.11 MIL/uL   Hemoglobin 12.6 12.0 - 15.0 g/dL   HCT 40.6 36.0 - 46.0 %   MCV 81.2 78.0 - 100.0 fL   MCH 25.2 (L) 26.0 - 34.0 pg   MCHC 31.0 30.0 - 36.0 g/dL   RDW 16.6 (H) 11.5 - 15.5 %   Platelets 273 150 - 400 K/uL  Basic metabolic panel  Result Value Ref Range   Sodium 139 135 - 145 mmol/L   Potassium 3.5 3.5 - 5.1 mmol/L   Chloride 107 98 - 111 mmol/L   CO2 23 22 - 32 mmol/L   Glucose, Bld 184 (H) 70 - 99 mg/dL   BUN 13 6 - 20 mg/dL   Creatinine, Ser 0.98 0.44 - 1.00 mg/dL   Calcium 8.8 (L) 8.9 - 10.3 mg/dL   GFR calc non Af Amer >60 >60 mL/min   GFR calc Af Amer >60 >60 mL/min   Anion gap 9 5 - 15  I-stat  troponin, ED  Result Value Ref Range   Troponin i, poc 0.09 (HH) 0.00 - 0.08 ng/mL   Comment NOTIFIED PHYSICIAN    Comment 3           Dg Chest Port 1 View  Result Date: 05/12/2018 CLINICAL DATA:  Shortness of Breath EXAM: PORTABLE CHEST 1 VIEW COMPARISON:  March 12, 2018 FINDINGS: There is no edema or consolidation. There is cardiomegaly with pulmonary vascularity normal. No adenopathy. No bone lesions. No pneumothorax. IMPRESSION: Cardiomegaly, stable.  No edema or consolidation. Electronically Signed   By: Lowella Grip III M.D.   On: 05/12/2018 10:14    EKG EKG Interpretation  Date/Time:  Monday May 12 2018 10:58:25 EDT Ventricular Rate:  169 PR Interval:    QRS Duration: 94 QT Interval:  312 QTC Calculation: 524 R Axis:   99 Text Interpretation:  Narrow QRS tachycardia Non-specific ST-t changes Confirmed by Lajean Saver [id number]) on 05/12/2018 11:03:08 AM   Radiology Dg Chest Port 1 View  Result Date: 05/12/2018 CLINICAL DATA:  Shortness of Breath EXAM: PORTABLE CHEST 1 VIEW COMPARISON:  March 12, 2018 FINDINGS: There is no edema or consolidation. There is cardiomegaly with pulmonary vascularity normal. No adenopathy. No bone lesions. No pneumothorax. IMPRESSION: Cardiomegaly, stable.  No edema or consolidation. Electronically Signed   By: Lowella Grip III M.D.   On: 05/12/2018 10:14    Procedures Procedures (including critical care time)  Medications Ordered in ED Medications  diltiazem (CARDIZEM) 1 mg/mL load via infusion 10 mg (10 mg Intravenous Bolus from Bag 05/12/18 1011)    And  diltiazem (CARDIZEM) 100 mg in dextrose 5% 134m (1 mg/mL) infusion (5 mg/hr Intravenous New Bag/Given 05/12/18 1016)     Initial Impression /  Assessment and Plan / ED Course  I have reviewed the triage vital signs and the nursing notes.  Pertinent labs & imaging results that were available during my care of the patient were reviewed by me and considered in my medical decision  making (see chart for details).  Iv ns. Continuous pulse ox and monitor. o2 . Labs. Cxr.   Reviewed nursing notes and prior charts for additional history. No record in epic of recent cardiology visit - pts cardiologist consulted - discussed pt, hr, they will see in ED and admit.  ?svt vs afib/flutter - hr 170, regular, narrow complex - cardizem - no appreciable change in rate.    Adenosine 6 mg rapid ivp.  Several seconds post adenosine - brief block shows underlying rhythm is a flutter - pts rate increases back to 160.   cardizem bolus/gtt - titrate for rate control.   cxr reviewed - cm, c/w prior.   Labs reviewed - initial trop mildly elev - due to tachycardic/strain.   As on chronic steroid therapy, tachy/dyspnea, will give stress dose steroids.  As pt only anticoag for 1 week, will hold/avoid cardioversion. Rate control meds titrated.   CRITICAL CARE:  RE atrial flutter with rapid ventricular response, dyspnea, lupus.  Performed by: Mirna Mires Total critical care time: 45 minutes Critical care time was exclusive of separately billable procedures and treating other patients. Critical care was necessary to treat or prevent imminent or life-threatening deterioration. Critical care was time spent personally by me on the following activities: development of treatment plan with patient and/or surrogate as well as nursing, discussions with consultants, evaluation of patient's response to treatment, examination of patient, obtaining history from patient or surrogate, ordering and performing treatments and interventions, ordering and review of laboratory studies, ordering and review of radiographic studies, pulse oximetry and re-evaluation of patient's condition.   Final Clinical Impressions(s) / ED Diagnoses   Final diagnoses:  None    ED Discharge Orders    None       Lajean Saver, MD 05/12/18 1210

## 2018-05-12 NOTE — ED Triage Notes (Signed)
Pt to ER for evaluation of shortness of breath onset 2-3 days ago. Also reports left flank pain onset 5 days ago with pain in legs. Pt HR 160 in triage.

## 2018-05-12 NOTE — ED Notes (Signed)
Call received from [facility] with cardiology. Orders already placed at this time.

## 2018-05-13 ENCOUNTER — Ambulatory Visit: Payer: Medicaid Other | Admitting: Nurse Practitioner

## 2018-05-13 ENCOUNTER — Encounter (HOSPITAL_COMMUNITY)
Admission: EM | Disposition: A | Discharge status: Home / Self Care | Payer: Self-pay | Source: Home / Self Care | Attending: Cardiology

## 2018-05-13 ENCOUNTER — Other Ambulatory Visit: Payer: Self-pay | Admitting: Nurse Practitioner

## 2018-05-13 DIAGNOSIS — I10 Essential (primary) hypertension: Secondary | ICD-10-CM

## 2018-05-13 LAB — MAGNESIUM
Magnesium: 1.7 mg/dL (ref 1.7–2.4)
Magnesium: 2.2 mg/dL (ref 1.7–2.4)

## 2018-05-13 LAB — GLUCOSE, CAPILLARY
Glucose-Capillary: 111 mg/dL — ABNORMAL HIGH (ref 70–99)
Glucose-Capillary: 120 mg/dL — ABNORMAL HIGH (ref 70–99)
Glucose-Capillary: 298 mg/dL — ABNORMAL HIGH (ref 70–99)
Glucose-Capillary: 247 mg/dL — ABNORMAL HIGH (ref 70–99)

## 2018-05-13 LAB — BASIC METABOLIC PANEL
Anion gap: 11 (ref 5–15)
BUN: 9 mg/dL (ref 6–20)
CO2: 23 mmol/L (ref 22–32)
Calcium: 8.7 mg/dL — ABNORMAL LOW (ref 8.9–10.3)
Chloride: 105 mmol/L (ref 98–111)
Creatinine, Ser: 0.78 mg/dL (ref 0.44–1.00)
GFR calc Af Amer: >60 mL/min (ref >60)
GFR calc non Af Amer: >60 mL/min (ref >60)
Glucose, Bld: 117 mg/dL — ABNORMAL HIGH (ref 70–99)
Potassium: 3.3 mmol/L — ABNORMAL LOW (ref 3.5–5.1)
Sodium: 139 mmol/L (ref 135–145)

## 2018-05-13 LAB — POTASSIUM: Potassium: 4.5 mmol/L (ref 3.5–5.1)

## 2018-05-13 SURGERY — ECHOCARDIOGRAM, TRANSESOPHAGEAL
Anesthesia: Moderate Sedation

## 2018-05-13 MED ORDER — POTASSIUM CHLORIDE CRYS ER 20 MEQ PO TBCR
40.0000 meq | EXTENDED_RELEASE_TABLET | Freq: Once | ORAL | Status: AC
  Administered 2018-05-13: 40 meq via ORAL
  Filled 2018-05-13: qty 2

## 2018-05-13 MED ORDER — POTASSIUM CHLORIDE ER 10 MEQ PO TBCR
10.0000 meq | EXTENDED_RELEASE_TABLET | Freq: Two times a day (BID) | ORAL | Status: DC
  Administered 2018-05-13 – 2018-05-14 (×3): 10 meq via ORAL
  Filled 2018-05-13 (×7): qty 1

## 2018-05-13 MED ORDER — MAGNESIUM SULFATE 2 GM/50ML IV SOLN
2.0000 g | Freq: Once | INTRAVENOUS | Status: AC
  Administered 2018-05-13: 2 g via INTRAVENOUS
  Filled 2018-05-13: qty 50

## 2018-05-13 MED FILL — GLIMEPIRIDE 2 MG TABS: 2 | 30 days supply | Qty: 30 | Fill #2

## 2018-05-13 MED FILL — HYDROXYCHLOROQUINE SULFATE: 200 | 30 days supply | Qty: 60 | Fill #2

## 2018-05-13 MED FILL — !COLCRYS 0.6 MG TABLET: 0.6 MG | 30 days supply | Qty: 60 | Fill #2

## 2018-05-13 MED FILL — ROSUVASTATIN CALCIUM 20 MG: 20 | 30 days supply | Qty: 30 | Fill #0

## 2018-05-13 NOTE — Progress Notes (Addendum)
Progress Note  Patient Name: [name] Date of Encounter: 05/13/2018  Primary Cardiologist: Dr. Virgina Jock  Subjective   Feels  Good, no CP or SOB  Inpatient Medications    Scheduled Meds: . apixaban  5 mg Oral BID  . colchicine  0.6 mg Oral BID  . furosemide  40 mg Oral BID  . glimepiride  2 mg Oral BH-q7a  . hydroxychloroquine  200 mg Oral BID  . insulin aspart  0-20 Units Subcutaneous TID WC  . insulin aspart  0-5 Units Subcutaneous QHS  . insulin glargine  12 Units Subcutaneous Daily  . lisinopril  20 mg Oral Daily  . metFORMIN  1,000 mg Oral BID WC  . metoprolol tartrate  50 mg Oral BID  . pantoprazole  40 mg Oral Daily  . predniSONE  40 mg Oral Q breakfast  . rosuvastatin  20 mg Oral q1800  . sodium chloride flush  3 mL Intravenous Q12H  . sotalol  80 mg Oral Q12H   Continuous Infusions: . sodium chloride    . sodium chloride 20 mL/hr at 05/13/18 0600  . diltiazem (CARDIZEM) infusion 5 mg/hr (05/13/18 0600)   PRN Meds: acetaminophen, Influenza vac split quadrivalent PF, metoprolol tartrate, ondansetron (ZOFRAN) IV, pneumococcal 23 valent vaccine, sodium chloride flush   Vital Signs    Vitals:   05/12/18 2338 05/13/18 0011 05/13/18 0538 05/13/18 0733  BP: 121/90 (!) 121/91 109/72 94/72  Pulse: 97 91  85  Resp:    (!) 29  Temp:  98 F (36.7 C) 98.7 F (37.1 C)   TempSrc:  Oral Oral   SpO2:  99%  100%  Weight:   104.2 kg   Height:        Intake/Output Summary (Last 24 hours) at 05/13/2018 0808 Last data filed at 05/13/2018 0800 Gross per 24 hour  Intake 373.73 ml  Output 2950 ml  Net -2576.27 ml   Filed Weights   05/12/18 0951 05/12/18 1958 05/13/18 0538  Weight: 101 kg 105 kg 104.2 kg    Telemetry    SR, frequent PACs - Personally Reviewed  ECG    SR, PACs 93bpm, measured QT 351ms, QTc 448 - Personally Reviewed  Physical Exam   GEN: No acute distress.   Neck: No JVD Cardiac: RRR, extrasystoles, no murmurs, rubs, or gallops.    Respiratory: CTA b/l. GI: Soft, nontender, non-distended  MS: No edema; No deformity. Neuro:  Nonfocal  Psych: Normal affect   Labs    Chemistry Recent Labs  Lab 05/12/18 1002  NA 139  K 3.5  CL 107  CO2 23  GLUCOSE 184*  BUN 13  CREATININE 0.98  CALCIUM 8.8*  GFRNONAA >60  GFRAA >60  ANIONGAP 9     Hematology Recent Labs  Lab 05/12/18 1002  WBC 7.3  RBC 5.00  HGB 12.6  HCT 40.6  MCV 81.2  MCH 25.2*  MCHC 31.0  RDW 16.6*  PLT 273    Cardiac EnzymesNo results for input(s): TROPONINI in the last 168 hours.  Recent Labs  Lab 05/12/18 1003  TROPIPOC 0.09*     BNP Recent Labs  Lab 05/12/18 1002  BNP 302.7*     DDimer No results for input(s): DDIMER in the last 168 hours.   Radiology    Dg Chest Port 1 View Result Date: 05/12/2018 CLINICAL DATA:  Shortness of Breath EXAM: PORTABLE CHEST 1 VIEW COMPARISON:  March 12, 2018 FINDINGS: There is no edema or consolidation. There is  cardiomegaly with pulmonary vascularity normal. No adenopathy. No bone lesions. No pneumothorax. IMPRESSION: Cardiomegaly, stable.  No edema or consolidation. Electronically Signed   By: Lowella Grip III M.D.   On: 05/12/2018 10:14    Cardiac Studies   03/14/18; LHC LM: Normal LAD: Prox focal 50% stenosis LCx: Normal RCA: Normal  Borderline pulmonary hypertension (mean PA 20-24 mmHg) Invasive hemodynamic findings do not suggest tamponade or constriction physiology.  03/13/18: TTE Study Conclusions - Left ventricle: There was moderate concentric hypertrophy. The estimated ejection fraction was in the range of 55% to 60%. Wall motion was normal; there were no regional wall motion abnormalities. Doppler parameters are consistent with abnormal left ventricular relaxation (grade 1 diastolic dysfunction). - Aortic valve: There was mild stenosis. There was trivial regurgitation. - Left atrium: The atrium was severely dilated. - Pericardium, extracardiac: Moderate  circumferential pericardial effusion, most prominent adjacent to right atrium. No significant change compared to previous studies in 12/2017 and 01/2018. Normal right atrial pressure makes constrictive pericarditis unlikely. No evidence of hemodynamic compromise. - No significant change compared to previous studies in 12/2017 and 01/2018.  Patient Profile     42 y.o. female with a hx of HTN, DM, morbid obesity, lupus (SLE on plaquenil), with recurrent moderate pericardial effusion, and more recently new AFlutter, admitted with recurrent AFlutter w/RVR  Assessment & Plan    1. Aflutter, looks typical     CHA2DS2Vasc is 3, she was given samples of Eliquis last week at Dr. Bonney Roussel office      She has converted to [place]  Started on sotalol last PM w/Dr. Einar Gip QTc looks OK Recommend daily BMET/ Mag/and post dose EKGs while load in progress, she will need to stay at least until tomorrow for this She is in SR this morning, will resume her diet  Out patient AFlutter ablation is scheduled. 1st available slot w/anesthesia support is 06/10/18  Further with Dr. Einar Gip, primary cardiology       For questions or updates, please contact [place] Please consult [email or web address] for contact info under        Signed, Baldwin Jamaica, PA-C  05/13/2018, 8:08 AM    EP Attending  Patient seen and examined. Agree with above. She has reverted back to NSR on 80 bid of sotalol. She feels better. She will have more atrial flutter. I recommend she be allowed to be discharged home and we will schedule catheter ablation with anesthesia in the next few weeks as our schedule allows.  Mikle Bosworth.D.

## 2018-05-13 NOTE — Progress Notes (Signed)
Subjective:  Feels better. Dyspnea resolved. No CP  Objective:  Vital Signs in the last 24 hours: Temp:  [98 F (36.7 C)-99.6 F (37.6 C)] 99.6 F (37.6 C) (10/01 0800) Pulse Rate:  [61-97] 89 (10/01 1100) Resp:  [11-29] 11 (10/01 1100) BP: (94-140)/(72-96) 115/88 (10/01 1100) SpO2:  [99 %-100 %] 100 % (10/01 0733) Weight:  [104.2 kg-105 kg] 104.2 kg (10/01 0538)  Intake/Output from previous day: 09/30 0701 - 10/01 0700 In: 373.7 [I.V.:373.7] Out: 2600 [Urine:2600]  Physical Exam: Blood pressure 115/88, pulse 89, temperature 99.6 F (37.6 C), resp. rate 11, height 5' (1.524 m), weight 104.2 kg, SpO2 100 %.   Physical Exam  Constitutional: She is oriented to person, place, and time. She appears well-developed and well-nourished. No distress.  Morbidly obese  Neck: No JVD present.  Cardiovascular: Normal rate, regular rhythm and normal heart sounds. Exam reveals no gallop and no friction rub.  No murmur heard. Pulmonary/Chest: Effort normal and breath sounds normal.  Abdominal: Soft. Bowel sounds are normal.  Large pannus present  Musculoskeletal: Normal range of motion. She exhibits no edema.  Neurological: She is alert and oriented to person, place, and time.  Skin: Skin is warm and dry.   Lab Results: BMP Recent Labs    03/15/18 0607 05/12/18 1002 05/13/18 0817  NA 137 139 139  K 3.7 3.5 3.3*  CL 101 107 105  CO2 25 23 23   GLUCOSE 208* 184* 117*  BUN 20 13 9   CREATININE 0.78 0.98 0.78  CALCIUM 9.2 8.8* 8.7*  GFRNONAA >60 >60 >60  GFRAA >60 >60 >60    CBC Recent Labs  Lab 05/12/18 1002  WBC 7.3  RBC 5.00  HGB 12.6  HCT 40.6  PLT 273  MCV 81.2  MCH 25.2*  MCHC 31.0  RDW 16.6*    HEMOGLOBIN A1C Lab Results  Component Value Date   HGBA1C 9.1 (H) 03/13/2018   MPG 214.47 03/13/2018    Cardiac Panel (last 3 results) Recent Labs    03/13/18 0952 03/13/18 1543 03/13/18 2045  TROPONINI 0.03* 0.03* 0.03*  TSH Recent Labs    12/25/17 0206  03/13/18 0218 05/12/18 2022  TSH 5.026* 1.041 0.817   Hepatic Function Panel Recent Labs    12/25/17 0553 03/13/18 0312  PROT 7.6 8.2*  ALBUMIN 3.3* 3.5  AST 16 18  ALT 11* 13  ALKPHOS 51 59  BILITOT 0.7 0.6    Imaging: Dg Chest Port 1 View  Result Date: 05/12/2018 CLINICAL DATA:  Shortness of Breath EXAM: PORTABLE CHEST 1 VIEW COMPARISON:  March 12, 2018 FINDINGS: There is no edema or consolidation. There is cardiomegaly with pulmonary vascularity normal. No adenopathy. No bone lesions. No pneumothorax. IMPRESSION: Cardiomegaly, stable.  No edema or consolidation. Electronically Signed   By: Lowella Grip III M.D.   On: 05/12/2018 10:14    Cardiac Studies:  EKG 05/12/2018: Typical atrial flutter with rapid ventricular response.  Ventricular rate 169 bpm.  No ST-T wave changes of ischemia  EKG 04/13/2018: NSR. Lateral ischemic (new from previous). Normal QT interval.   Echocardiogram 03/13/2018: Normal LV size, moderate LVH, EF 55 to 43%, grade 1 diastolic dysfunction, mild aortic stenosis with trace aortic regurgitation.  Severely dilated left atrium. Moderate circumferential pericardial effusion, no significant change from 12/30/2017 and 01/30/2018.  Normal right atrial pressure.  No hemodynamic consequence.  Assessment/Plan:  1.  Paroxysmal atrial flutter with variable AV conduction CHA2DS2-VASCScore: Risk Score  3,  Yearly risk of stroke  3.2.  Recommendation: ASA No/Anticoagulation Yes 2.  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 3.  Type 2 diabetes mellitus uncontrolled with hyperglycemia 4.  Hypertension 5.  Moderate coronary artery disease, proximal LAD 50% stenosis by angiography on 03/14/2018 6.  Morbid obesity 7. Abnormal EKG  Now in sinus. DM  Still uncontrolled d/w patient regarding diet. BP is normalalized Abnormal EKG probably due to memory  effect from A. Fl yesterday with RVR and probably demand ischemia as well. Do not think ACS. For elective A. Flutter ablation in 3 weeks with Dr. Lovena Le.  Check BMP tomorrow   Adrian Prows, M.D. 05/13/2018, 3:04 PM [facility], [place] Pager: [phone] Office: [phone] If no answer: [phone]

## 2018-05-14 LAB — BASIC METABOLIC PANEL
Anion gap: 6 (ref 5–15)
BUN: 14 mg/dL (ref 6–20)
CO2: 23 mmol/L (ref 22–32)
Calcium: 8.2 mg/dL — ABNORMAL LOW (ref 8.9–10.3)
Chloride: 106 mmol/L (ref 98–111)
Creatinine, Ser: 0.9 mg/dL (ref 0.44–1.00)
GFR calc Af Amer: >60 mL/min (ref >60)
GFR calc non Af Amer: >60 mL/min (ref >60)
Glucose, Bld: 144 mg/dL — ABNORMAL HIGH (ref 70–99)
Potassium: 4.2 mmol/L (ref 3.5–5.1)
Sodium: 135 mmol/L (ref 135–145)

## 2018-05-14 LAB — GLUCOSE, CAPILLARY
Glucose-Capillary: 107 mg/dL — ABNORMAL HIGH (ref 70–99)
Glucose-Capillary: 193 mg/dL — ABNORMAL HIGH (ref 70–99)

## 2018-05-14 MED ORDER — POTASSIUM CHLORIDE ER 10 MEQ PO TBCR: 10.0000 meq | EXTENDED_RELEASE_TABLET | Freq: Two times a day (BID) | ORAL | 1 refills | Status: DC

## 2018-05-14 MED ORDER — APIXABAN 5 MG PO TABS: 5.0000 mg | ORAL_TABLET | Freq: Two times a day (BID) | ORAL | Status: DC

## 2018-05-14 MED ORDER — SOTALOL HCL 80 MG PO TABS: 80.0000 mg | ORAL_TABLET | Freq: Two times a day (BID) | ORAL | 1 refills | Status: DC

## 2018-05-14 MED FILL — SOTALOL HCL 80 MG TAB: 80 | 30 days supply | Qty: 60 | Fill #0

## 2018-05-14 MED FILL — POTASSIUM CHLORIDE ER 10 ME: 10 | 30 days supply | Qty: 60 | Fill #0

## 2018-05-14 NOTE — Progress Notes (Signed)
Inpatient Diabetes Program Recommendations  AACE/ADA: New Consensus Statement on Inpatient Glycemic Control (2015)  Target Ranges:  Prepandial:   less than 140 mg/dL      Peak postprandial:   less than 180 mg/dL (1-2 hours)      Critically ill patients:  140 - 180 mg/dL   Lab Results  Component Value Date   GLUCAP 193 (H) 05/14/2018   HGBA1C 9.1 (H) 03/13/2018    Review of Glycemic Control Results for [name], [name] (MRN [id number]) as of 05/14/2018 12:38  Ref. Range 05/13/2018 16:07 05/13/2018 20:42 05/14/2018 07:56 05/14/2018 12:11  Glucose-Capillary Latest Ref Range: 70 - 99 mg/dL 298 (H) 247 (H) 107 (H) 193 (H)   Diabetes history: Type 2 DM Outpatient Diabetes medications: Metformin 1000 mg BID, Amaryl 2 mg QAM, Lantus 12 units QD Current orders for Inpatient glycemic control:Metformin 1000 mg BID, Amaryl 2 mg QAM, Lantus 12 units QD, Novolog 0-20 units TID, Novolog 0-5 units QHS   Inpatient Diabetes Program Recommendations:    In the setting of steroids, consider adding Novolog 3 units TID for meal coverage, assuming that patient is eating >50% of meal).  Per PCP visit in 03/2018, patient has been on Prednisone for RA, thus contributing to her increased trend in blood sugars.  Thanks, Bronson Curb, MSN, RNC-OB Diabetes Coordinator [phone] (8a-5p)

## 2018-05-14 NOTE — Progress Notes (Addendum)
Progress Note  Patient Name: [name] Date of Encounter: 05/14/2018  Primary Cardiologist: Dr. Virgina Jock  Subjective   Feels good, no CP or SOB  Inpatient Medications    Scheduled Meds: . apixaban  5 mg Oral BID  . colchicine  0.6 mg Oral BID  . furosemide  40 mg Oral BID  . glimepiride  2 mg Oral BH-q7a  . hydroxychloroquine  200 mg Oral BID  . insulin aspart  0-20 Units Subcutaneous TID WC  . insulin aspart  0-5 Units Subcutaneous QHS  . insulin glargine  12 Units Subcutaneous Daily  . lisinopril  20 mg Oral Daily  . metFORMIN  1,000 mg Oral BID WC  . metoprolol tartrate  50 mg Oral BID  . pantoprazole  40 mg Oral Daily  . potassium chloride  10 mEq Oral BID  . predniSONE  40 mg Oral Q breakfast  . rosuvastatin  20 mg Oral q1800  . sodium chloride flush  3 mL Intravenous Q12H  . sotalol  80 mg Oral Q12H   Continuous Infusions: . sodium chloride    . sodium chloride 20 mL/hr at 05/13/18 0600   PRN Meds: acetaminophen, Influenza vac split quadrivalent PF, metoprolol tartrate, ondansetron (ZOFRAN) IV, pneumococcal 23 valent vaccine, sodium chloride flush   Vital Signs    Vitals:   05/13/18 2223 05/14/18 0110 05/14/18 0456 05/14/18 0752  BP:   121/89 123/82  Pulse: (!) 109  93 87  Resp:   18 18  Temp:  98.2 F (36.8 C) 98.2 F (36.8 C) 99 F (37.2 C)  TempSrc:  Oral  Oral  SpO2:  99% 93% 99%  Weight:      Height:        Intake/Output Summary (Last 24 hours) at 05/14/2018 0914 Last data filed at 05/14/2018 0714 Gross per 24 hour  Intake 600 ml  Output 1200 ml  Net -600 ml   Filed Weights   05/12/18 0951 05/12/18 1958 05/13/18 0538  Weight: 101 kg 105 kg 104.2 kg    Telemetry    SR, frequent PACs - Personally Reviewed  ECG    ST, PACs, 104 bpm, reviewed with dr. Lovena Le, QTc is OK  Physical Exam   Exam is unchanged today GEN: No acute distress.   Neck: No JVD Cardiac: RRR, extrasystoles, no murmurs, rubs, or gallops.    Respiratory: CTA b/l. GI: Soft, nontender, non-distended  MS: No edema; No deformity. Neuro:  Nonfocal  Psych: Normal affect   Labs    Chemistry Recent Labs  Lab 05/12/18 1002 05/13/18 0817 05/13/18 2056 05/14/18 0400  NA 139 139  --  135  K 3.5 3.3* 4.5 4.2  CL 107 105  --  106  CO2 23 23  --  23  GLUCOSE 184* 117*  --  144*  BUN 13 9  --  14  CREATININE 0.98 0.78  --  0.90  CALCIUM 8.8* 8.7*  --  8.2*  GFRNONAA >60 >60  --  >60  GFRAA >60 >60  --  >60  ANIONGAP 9 11  --  6     Hematology Recent Labs  Lab 05/12/18 1002  WBC 7.3  RBC 5.00  HGB 12.6  HCT 40.6  MCV 81.2  MCH 25.2*  MCHC 31.0  RDW 16.6*  PLT 273    Cardiac EnzymesNo results for input(s): TROPONINI in the last 168 hours.  Recent Labs  Lab 05/12/18 1003  TROPIPOC 0.09*     BNP  Recent Labs  Lab 05/12/18 1002  BNP 302.7*     DDimer No results for input(s): DDIMER in the last 168 hours.   Radiology    Dg Chest Port 1 View Result Date: 05/12/2018 CLINICAL DATA:  Shortness of Breath EXAM: PORTABLE CHEST 1 VIEW COMPARISON:  March 12, 2018 FINDINGS: There is no edema or consolidation. There is cardiomegaly with pulmonary vascularity normal. No adenopathy. No bone lesions. No pneumothorax. IMPRESSION: Cardiomegaly, stable.  No edema or consolidation. Electronically Signed   By: Lowella Grip III M.D.   On: 05/12/2018 10:14    Cardiac Studies   03/14/18; LHC LM: Normal LAD: Prox focal 50% stenosis LCx: Normal RCA: Normal  Borderline pulmonary hypertension (mean PA 20-24 mmHg) Invasive hemodynamic findings do not suggest tamponade or constriction physiology.  03/13/18: TTE Study Conclusions - Left ventricle: There was moderate concentric hypertrophy. The estimated ejection fraction was in the range of 55% to 60%. Wall motion was normal; there were no regional wall motion abnormalities. Doppler parameters are consistent with abnormal left ventricular relaxation (grade 1  diastolic dysfunction). - Aortic valve: There was mild stenosis. There was trivial regurgitation. - Left atrium: The atrium was severely dilated. - Pericardium, extracardiac: Moderate circumferential pericardial effusion, most prominent adjacent to right atrium. No significant change compared to previous studies in 12/2017 and 01/2018. Normal right atrial pressure makes constrictive pericarditis unlikely. No evidence of hemodynamic compromise. - No significant change compared to previous studies in 12/2017 and 01/2018.  Patient Profile     42 y.o. female with a hx of HTN, DM, morbid obesity, lupus (SLE on plaquenil), with recurrent moderate pericardial effusion, and more recently new AFlutter, admitted with recurrent AFlutter w/RVR  Assessment & Plan    1. Aflutter, looks typical     CHA2DS2Vasc is 3, on Eliquis      She remains in SR  on sotalol s/p 3 doses QTc looks OK K+ 4.2 Mag last night 2.2 Creat 0.90   Out patient AFlutter ablation is scheduled. 1st available slot w/anesthesia support is 06/10/18, d/w patient, procedure day details and instructions provided in AVS as well  Dr. Lovena Le has seen and examined the patient  Further with Dr. Einar Gip, primary cardiology       For questions or updates, please contact [place] Please consult [email or web address] for contact info under        Signed, Baldwin [place], PA-C  05/14/2018, 9:14 AM    EP Attending  Patient seen and examined. Agree with above. The patient is doing well today. She is maintaining NSR. I would suggest she continue the sotalol. Ok for DC home after the 4th dose of sotalol. Her QT is ok. We plan to arrange catheter ablation with general anesthesia due to her increased soft tissue in the head and neck.  Mikle Bosworth.D.

## 2018-05-14 NOTE — Discharge Summary (Signed)
Physician Discharge Summary  Patient ID: [name] MRN: [id number] DOB/AGE: 42-Jul-1977 42 y.o.  Admit date: 05/12/2018 Discharge date: 05/14/2018  Primary Discharge Diagnosis Atrial flutter, typical with rapid ventricular response CHA2DS2-VASCScore: Risk Score3, Yearly risk of stroke 3.2. Recommendation: ASANo/AnticoagulationYes  Secondary Discharge Diagnosis  2. 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 3. Type 2 diabetes mellitus uncontrolled with hyperglycemia 4. Hypertension 5. Moderate coronary artery disease, proximal LAD 50% stenosis by angiography on 03/14/2018 6. Morbid obesity 7. Abnormal EKG  Significant Diagnostic Studies:  EKG 05/12/2018:Typical atrial flutter with rapid ventricular response. Ventricular rate 169 bpm. No ST-T wave changes of ischemia  EKG 04/13/2018: NSR. Lateral ischemic (new from previous). Normal QT interval.   EKG 05/14/2018: Sinus tachycardia at rate of 111 4 bpm, left atrial enlargement, normal axis.  To PVCs and occasional PACs.  Echocardiogram 03/13/2018: Normal LV size, moderate LVH, EF 55 to 62%, grade 1 diastolic dysfunction, mild aortic stenosis with trace aortic regurgitation. Severely dilated left atrium. Moderate circumferential pericardial effusion, no significant change from 12/30/2017 and 01/30/2018. Normal right atrial pressure. No hemodynamic consequence.  Hospital Course: Patient admitted to the hospital with generalized weakness and not feeling well, on admission was found to be in atrial flutter with rapid ventricular response, typical atrial flutter.  Fortunately, after receiving sotalol, she converted to sinus rhythm and maintained sinus rhythm for 48 hours, patient was admitted to the hospital for therapeutic drug monitoring in view of multiple cardiovascular risk factors and high risk  medication.  She was also seen by EP by Dr. Crissie Sickles and she is scheduled for atrial flutter ablation on 06/10/2018.  As she remained stable and maintained sinus rhythm, she was felt stable for discharge.  Metoprolol was discontinued, she'll be continued on sotalol, blood pressure was well controlled.  Diabetes education was discussed with the patient extensively.  Weight loss was also discussed with the patient extensively.  She'll be continued on Eliquis for anticoagulation for now.  In view of her medical comorbidities, lupus, I did not feel amiodarone would be a good choice.  Discharge Exam: Blood pressure 123/82, pulse 87, temperature 99 F (37.2 C), temperature source Oral, resp. rate 18, height 5' (1.524 m), weight 104.2 kg, SpO2 99 %.  Physical Exam  Constitutional: She is oriented to person, place, and time. She appears well-developed and well-nourished.  Morbidly obese and cushingoid  Neck: Neck supple.  Cardiovascular: Regular rhythm. Tachycardia present.  No murmur heard. Pulmonary/Chest: Effort normal and breath sounds normal.  Abdominal: Soft. Bowel sounds are normal.  Pannus present  Musculoskeletal: Normal range of motion. She exhibits no edema.  Neurological: She is alert and oriented to person, place, and time.  Skin: Skin is warm and dry.  Psychiatric: She has a normal mood and affect.   Labs:   Lab Results  Component Value Date   WBC 7.3 05/12/2018   HGB 12.6 05/12/2018   HCT 40.6 05/12/2018   MCV 81.2 05/12/2018   PLT 273 05/12/2018    Recent Labs  Lab 05/14/18 0400  NA 135  K 4.2  CL 106  CO2 23  BUN 14  CREATININE 0.90  CALCIUM 8.2*  GLUCOSE 144*    BNP (last 3 results) Recent Labs    03/12/18 2155 03/13/18 0218 05/12/18 1002  BNP 180.7* 229.2* 302.7*    HEMOGLOBIN A1C Lab Results  Component Value Date  HGBA1C 9.1 (H) 03/13/2018   MPG 214.47 03/13/2018    Cardiac Panel (last 3 results) Recent Labs    03/13/18 0952  03/13/18 1543 03/13/18 2045  TROPONINI 0.03* 0.03* 0.03*   TSH Recent Labs    12/25/17 0206 03/13/18 0218 05/12/18 2022  TSH 5.026* 1.041 0.817   Radiology: Dg Chest Port 1 View  Result Date: 05/12/2018 CLINICAL DATA:  Shortness of Breath EXAM: PORTABLE CHEST 1 VIEW COMPARISON:  March 12, 2018 FINDINGS: There is no edema or consolidation. There is cardiomegaly with pulmonary vascularity normal. No adenopathy. No bone lesions. No pneumothorax. IMPRESSION: Cardiomegaly, stable.  No edema or consolidation. Electronically Signed   By: Lowella Grip III M.D.   On: 05/12/2018 10:14   FOLLOW UP PLANS AND APPOINTMENTS  Allergies as of 05/14/2018      Reactions   Septra [bactrim] Hives      Medication List    STOP taking these medications   metoprolol tartrate 50 MG tablet Commonly known as:  LOPRESSOR     TAKE these medications   acetaminophen 325 MG tablet Commonly known as:  TYLENOL Take 2 tablets (650 mg total) by mouth every 4 (four) hours as needed for headache or mild pain.   amLODipine 10 MG tablet Commonly known as:  NORVASC Take 1 tablet (10 mg total) by mouth daily.   apixaban 5 MG Tabs tablet Commonly known as:  ELIQUIS Take 1 tablet (5 mg total) by mouth 2 (two) times daily.   colchicine 0.6 MG tablet Take 1 tablet (0.6 mg total) by mouth 2 (two) times daily for 14 days.   furosemide 40 MG tablet Commonly known as:  LASIX Take 1 tablet (40 mg total) by mouth 2 (two) times daily.   glimepiride 2 MG tablet Commonly known as:  AMARYL Take 1 tablet (2 mg total) by mouth every morning.   glucose blood test strip Use as instructed   hydroxychloroquine 200 MG tablet Commonly known as:  PLAQUENIL Take 1 tablet (200 mg total) by mouth 2 (two) times daily.   insulin glargine 100 UNIT/ML injection Commonly known as:  LANTUS Inject 0.12 mLs (12 Units total) into the skin daily.   lisinopril 20 MG tablet Commonly known as:  PRINIVIL,ZESTRIL Take 1 tablet  (20 mg total) by mouth daily.   metFORMIN 1000 MG tablet Commonly known as:  GLUCOPHAGE Take 1 tablet (1,000 mg total) by mouth 2 (two) times daily with a meal.   omeprazole 20 MG capsule Commonly known as:  PRILOSEC Take 1 capsule (20 mg total) by mouth daily.   potassium chloride 10 MEQ tablet Commonly known as:  K-DUR Take 1 tablet (10 mEq total) by mouth 2 (two) times daily.   predniSONE 20 MG tablet Commonly known as:  DELTASONE Take 2 tablets (40 mg total) by mouth daily with breakfast.   rosuvastatin 20 MG tablet Commonly known as:  CRESTOR Take 1 tablet (20 mg total) by mouth daily at 6 PM.   sotalol 80 MG tablet Commonly known as:  BETAPACE Take 1 tablet (80 mg total) by mouth 2 (two) times daily.   TRUE METRIX METER w/Device Kit Check blood sugars fasting and at bedtime   TRUEPLUS LANCETS 28G Misc Check blood sugars as directed      Follow-up Information    [name], MD Follow up on 05/23/2018.   Specialty:  Cardiology Why:  Dickie La at 11:30 AM Contact information: [place] [address] [phone]  Evans Lance, MD Follow up.   Specialty:  Cardiology Why:  Your ablation/heart procedure is scheduled for 06/10/2018. Please arrive on time and Hospital will contact you with time. Do not eat after midnight of the procedure Contact information: [address] [place] [address] [phone]          Adrian Prows, MD 05/14/2018, 1:50 PM  Pager: [phone] Office: [phone] If no answer: [phone]

## 2018-05-14 NOTE — Care Management Note (Signed)
Case Management Note  Patient Details  Name: [name] MRN: [id number] Date of Birth: 13-Jan-1976  Subjective/Objective:   Pt presented for Atrial Fib/Flutter. Plan to transition home on Eliquis. Pt has family planning Medicaid. PCP [name] at the [facility] follow up scheduled and placed on AVS.            Action/Plan: CM will provide patient with 30 day free Eliquis Card. No further needs from CM at this time.   Expected Discharge Date:  05/14/18               Expected Discharge Plan:  Home/Self Care  In-House Referral:  NA  Discharge planning Services  CM Consult, Medication Assistance, Follow-up appt scheduled, [facility]  Post Acute Care Choice:  NA Choice offered to:  NA  DME Arranged:  N/A DME Agency:  NA  HH Arranged:  NA HH Agency:  NA  Status of Service:  Completed, signed off  If discussed at [place] of Stay Meetings, dates discussed:    Additional Comments:  Bethena Roys, RN 05/14/2018, 2:17 PM

## 2018-05-15 MED FILL — ELIQUIS 5 MG TABLET: 5 | 30 days supply | Qty: 60 | Fill #0

## 2018-05-23 ENCOUNTER — Encounter (HOSPITAL_COMMUNITY): Payer: Self-pay

## 2018-05-23 ENCOUNTER — Other Ambulatory Visit: Payer: Self-pay

## 2018-05-23 ENCOUNTER — Observation Stay (HOSPITAL_COMMUNITY)
Admission: EM | Admit: 2018-05-23 | Discharge: 2018-05-25 | Disposition: A | Discharge status: Home / Self Care | Payer: Self-pay | Attending: Internal Medicine | Admitting: Internal Medicine

## 2018-05-23 ENCOUNTER — Emergency Department (HOSPITAL_COMMUNITY): Payer: Self-pay

## 2018-05-23 DIAGNOSIS — I4891 Unspecified atrial fibrillation: Secondary | ICD-10-CM | POA: Diagnosis present

## 2018-05-23 DIAGNOSIS — E1165 Type 2 diabetes mellitus with hyperglycemia: Secondary | ICD-10-CM | POA: Insufficient documentation

## 2018-05-23 DIAGNOSIS — A419 Sepsis, unspecified organism: Secondary | ICD-10-CM | POA: Diagnosis present

## 2018-05-23 DIAGNOSIS — Z794 Long term (current) use of insulin: Secondary | ICD-10-CM | POA: Insufficient documentation

## 2018-05-23 DIAGNOSIS — N179 Acute kidney failure, unspecified: Secondary | ICD-10-CM | POA: Insufficient documentation

## 2018-05-23 DIAGNOSIS — Z79899 Other long term (current) drug therapy: Secondary | ICD-10-CM | POA: Insufficient documentation

## 2018-05-23 DIAGNOSIS — I5032 Chronic diastolic (congestive) heart failure: Secondary | ICD-10-CM | POA: Insufficient documentation

## 2018-05-23 DIAGNOSIS — L02419 Cutaneous abscess of limb, unspecified: Secondary | ICD-10-CM | POA: Diagnosis present

## 2018-05-23 DIAGNOSIS — Z882 Allergy status to sulfonamides status: Secondary | ICD-10-CM | POA: Insufficient documentation

## 2018-05-23 DIAGNOSIS — I482 Chronic atrial fibrillation, unspecified: Principal | ICD-10-CM | POA: Insufficient documentation

## 2018-05-23 DIAGNOSIS — I472 Ventricular tachycardia: Secondary | ICD-10-CM | POA: Insufficient documentation

## 2018-05-23 DIAGNOSIS — I110 Hypertensive heart disease with heart failure: Secondary | ICD-10-CM | POA: Insufficient documentation

## 2018-05-23 DIAGNOSIS — L0291 Cutaneous abscess, unspecified: Secondary | ICD-10-CM

## 2018-05-23 DIAGNOSIS — E1169 Type 2 diabetes mellitus with other specified complication: Secondary | ICD-10-CM

## 2018-05-23 DIAGNOSIS — IMO0002 Reserved for concepts with insufficient information to code with codable children: Secondary | ICD-10-CM | POA: Diagnosis present

## 2018-05-23 DIAGNOSIS — L03112 Cellulitis of left axilla: Secondary | ICD-10-CM | POA: Insufficient documentation

## 2018-05-23 DIAGNOSIS — L03119 Cellulitis of unspecified part of limb: Secondary | ICD-10-CM

## 2018-05-23 DIAGNOSIS — F329 Major depressive disorder, single episode, unspecified: Secondary | ICD-10-CM | POA: Insufficient documentation

## 2018-05-23 DIAGNOSIS — I2510 Atherosclerotic heart disease of native coronary artery without angina pectoris: Secondary | ICD-10-CM | POA: Insufficient documentation

## 2018-05-23 DIAGNOSIS — M329 Systemic lupus erythematosus, unspecified: Secondary | ICD-10-CM | POA: Insufficient documentation

## 2018-05-23 DIAGNOSIS — L02412 Cutaneous abscess of left axilla: Secondary | ICD-10-CM | POA: Insufficient documentation

## 2018-05-23 DIAGNOSIS — I313 Pericardial effusion (noninflammatory): Secondary | ICD-10-CM | POA: Insufficient documentation

## 2018-05-23 DIAGNOSIS — E119 Type 2 diabetes mellitus without complications: Secondary | ICD-10-CM

## 2018-05-23 DIAGNOSIS — I5033 Acute on chronic diastolic (congestive) heart failure: Secondary | ICD-10-CM | POA: Diagnosis present

## 2018-05-23 DIAGNOSIS — E876 Hypokalemia: Secondary | ICD-10-CM | POA: Insufficient documentation

## 2018-05-23 DIAGNOSIS — Z7901 Long term (current) use of anticoagulants: Secondary | ICD-10-CM | POA: Insufficient documentation

## 2018-05-23 LAB — BASIC METABOLIC PANEL
Anion gap: 11 (ref 5–15)
BUN: 16 mg/dL (ref 6–20)
CO2: 25 mmol/L (ref 22–32)
Calcium: 9.3 mg/dL (ref 8.9–10.3)
Chloride: 103 mmol/L (ref 98–111)
Creatinine, Ser: 1.32 mg/dL — ABNORMAL HIGH (ref 0.44–1.00)
GFR calc Af Amer: 57 mL/min — ABNORMAL LOW (ref >60)
GFR calc non Af Amer: 49 mL/min — ABNORMAL LOW (ref >60)
Glucose, Bld: 271 mg/dL — ABNORMAL HIGH (ref 70–99)
Potassium: 3.5 mmol/L (ref 3.5–5.1)
Sodium: 139 mmol/L (ref 135–145)

## 2018-05-23 LAB — I-STAT BETA HCG BLOOD, ED (MC, WL, AP ONLY): I-stat hCG, quantitative: <5 m[IU]/mL (ref <5)

## 2018-05-23 LAB — CBC
HCT: 43.8 % (ref 36.0–46.0)
Hemoglobin: 13.1 g/dL (ref 12.0–15.0)
MCH: 24.3 pg — ABNORMAL LOW (ref 26.0–34.0)
MCHC: 29.9 g/dL — ABNORMAL LOW (ref 30.0–36.0)
MCV: 81.3 fL (ref 80.0–100.0)
Platelets: 342 10*3/uL (ref 150–400)
RBC: 5.39 MIL/uL — ABNORMAL HIGH (ref 3.87–5.11)
RDW: 14.9 % (ref 11.5–15.5)
WBC: 22.6 10*3/uL — ABNORMAL HIGH (ref 4.0–10.5)
nRBC: 0 % (ref 0.0–0.2)

## 2018-05-23 MED ORDER — SOTALOL HCL 80 MG PO TABS: 80.0000 mg | ORAL_TABLET | Freq: Two times a day (BID) | ORAL | Status: DC

## 2018-05-23 MED ORDER — PROPOFOL 10 MG/ML IV BOLUS
INTRAVENOUS | Status: AC | PRN
  Administered 2018-05-23: 20 mg via INTRAVENOUS
  Administered 2018-05-23: 50 mg via INTRAVENOUS
  Administered 2018-05-23: 20 mg via INTRAVENOUS

## 2018-05-23 MED ORDER — LACTATED RINGERS IV BOLUS: 1000.0000 mL | Freq: Once | INTRAVENOUS | Status: DC

## 2018-05-23 MED ORDER — BENZOCAINE 20 % MT AERO
INHALATION_SPRAY | Freq: Once | OROMUCOSAL | Status: AC
  Administered 2018-05-23: 19:00:00 via OROMUCOSAL
  Filled 2018-05-23 (×2): qty 57

## 2018-05-23 MED ORDER — METOPROLOL TARTRATE 25 MG PO TABS
25.0000 mg | ORAL_TABLET | Freq: Once | ORAL | Status: AC
  Administered 2018-05-24: 25 mg via ORAL
  Filled 2018-05-23: qty 1

## 2018-05-23 MED ORDER — SODIUM CHLORIDE 0.9 % IV BOLUS
1000.0000 mL | Freq: Once | INTRAVENOUS | Status: AC
  Administered 2018-05-23: 1000 mL via INTRAVENOUS

## 2018-05-23 MED ORDER — ONDANSETRON HCL 4 MG/2ML IJ SOLN: 4.0000 mg | Freq: Once | INTRAMUSCULAR | Status: DC

## 2018-05-23 MED ORDER — PROPOFOL 10 MG/ML IV BOLUS
0.5000 mg/kg | Freq: Once | INTRAVENOUS | Status: AC
  Administered 2018-05-23: 50 mg via INTRAVENOUS
  Filled 2018-05-23: qty 20

## 2018-05-23 MED ORDER — SOTALOL HCL 80 MG PO TABS
80.0000 mg | ORAL_TABLET | Freq: Once | ORAL | Status: AC
  Administered 2018-05-23: 80 mg via ORAL
  Filled 2018-05-23: qty 1

## 2018-05-23 MED ORDER — LIDOCAINE HCL (PF) 1 % IJ SOLN
10.0000 mL | Freq: Once | INTRAMUSCULAR | Status: AC
  Administered 2018-05-23: 10 mL via INTRADERMAL
  Filled 2018-05-23: qty 10

## 2018-05-23 NOTE — ED Provider Notes (Signed)
[facility] EMERGENCY DEPARTMENT Provider Note   CSN: [id number] Arrival date & time: 05/23/18  1601     History   Chief Complaint Chief Complaint  Patient presents with  . Abscess  . Palpitations    HPI [name] is a 42 y.o. female.  HPI   42 year old female with PMH significant for SLE on Plaquenil, recent diagnosed A. fib/flutter on 04/30/2018 started on Eliquis same day, recent hospitalization discharge on 05/14/2018 for A. fib flutter with heart failure symptoms who presents from cardiology clinic secondary to A. fib/flutter with RVR and left axillary abscess..  Patient was started on on sotalol on previous admission and continued on Eliquis.  Patient with outpatient appointment for EP procedure for ablation on 06/10/2018.  Patient denies new onset chest pain, shortness of breath, or subjective feelings of palpitations.  Endorses mild worsening bilateral lower extremity edema over the last week.  Patient was surprised to find she was in a flutter today at cardiologist office.  Unsure of onset.  Patient endorses 2 days of left axillary pain, rapid progressing, currently moderate to severe, with associated swelling.  Denies fevers, endorses diaphoresis and chills onset today.  Patient endorses prior history of small bumps/abscesses in the left axilla in the past and denies 1 of this degree.  Patient endorses compliance with medications with no recent changes since last visit.  Past Medical History:  Diagnosis Date  . Abnormal Pap smear of cervix    colpo, HPV  . Allergy   . Anemia   . CHF (congestive heart failure) ([place])   . Depression    after losses  . Enlarged heart    managed by cardiology  . Fibroid   . Gestational diabetes   . Heart murmur   . Hypertension   . Infection    UTI  . Lupus ([place]) 1999    Patient Active Problem List   Diagnosis Date Noted  . A-fib ([place]) 05/12/2018  . Atrial flutter ([place]) 05/12/2018  . Acute on chronic  diastolic CHF (congestive heart failure) ([place]) 03/12/2018  . Pericardial effusion 01/16/2018  . Pleuritic chest pain 01/16/2018  . Chronic diastolic CHF (congestive heart failure) ([place]) 01/16/2018  . 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 Leucopenia   . Exertional dyspnea 12/25/2017  . Lower extremity edema 12/25/2017  . Hypertension 12/25/2017  . Microcytic anemia 12/25/2017  . Diabetes ([place]) 12/25/2017  . Chronic pain of both knees 04/23/2017  . Fibroids 01/20/2015  . Menorrhagia 01/20/2015  . Low grade squamous intraepithelial lesion (LGSIL) on cervical Pap smear on 01/20/15 01/20/2015  . Atypical glandular cells on cervical Pap smear on 01/20/15 01/20/2015  . Lupus ([place]) 07/15/2013    Past Surgical History:  Procedure Laterality Date  . RIGHT/LEFT HEART CATH AND CORONARY ANGIOGRAPHY N/A 03/14/2018   Procedure: RIGHT/LEFT HEART CATH AND CORONARY ANGIOGRAPHY;  Surgeon: [name], MD;  Location: [facility];  Service: Cardiovascular;  Laterality: N/A;  . THERAPEUTIC ABORTION       OB History    Gravida  5   Para  2   Term  0   Preterm  2   AB  3   Living  2     SAB  2   TAB  1   Ectopic  0   Multiple  0   Live Births  2            Home Medications    Prior to Admission medications  Medication Sig Start Date End Date Taking? Authorizing Provider  acetaminophen (TYLENOL) 325 MG tablet Take 2 tablets (650 mg total) by mouth every 4 (four) hours as needed for headache or mild pain. 12/28/17  Yes Georgette Shell, MD  amLODipine (NORVASC) 10 MG tablet Take 1 tablet (10 mg total) by mouth daily. 03/31/12  Yes West, Emily, PA-C  apixaban (ELIQUIS) 5 MG TABS tablet Take 1 tablet (5 mg total) by mouth 2 (two) times daily. 05/14/18  Yes Adrian Prows, MD  colchicine 0.6 MG tablet Take 0.6 mg by mouth 2 (two) times daily.   Yes [provider]  furosemide (LASIX) 40 MG tablet Take 1 tablet (40 mg total) by mouth 2 (two) times daily. 01/18/18   Yes Rai, Ripudeep K, MD  glimepiride (AMARYL) 2 MG tablet Take 1 tablet (2 mg total) by mouth every morning. 02/25/18 02/25/19 Yes Gildardo Pounds, NP  hydroxychloroquine (PLAQUENIL) 200 MG tablet Take 1 tablet (200 mg total) by mouth 2 (two) times daily. 01/18/18  Yes Rai, Ripudeep K, MD  insulin glargine (LANTUS) 100 UNIT/ML injection Inject 0.12 mLs (12 Units total) into the skin daily. Patient taking differently: Inject 12 Units into the skin daily before breakfast.  03/15/18  Yes Danford, Suann Larry, MD  lisinopril (PRINIVIL,ZESTRIL) 20 MG tablet Take 1 tablet (20 mg total) by mouth daily. 04/10/18  Yes Gildardo Pounds, NP  metFORMIN (GLUCOPHAGE) 1000 MG tablet Take 1 tablet (1,000 mg total) by mouth 2 (two) times daily with a meal. 01/18/18  Yes Rai, Ripudeep K, MD  omeprazole (PRILOSEC) 20 MG capsule Take 1 capsule (20 mg total) by mouth daily. 02/25/18 06/25/18 Yes Gildardo Pounds, NP  potassium chloride (K-DUR) 10 MEQ tablet Take 1 tablet (10 mEq total) by mouth 2 (two) times daily. 05/14/18  Yes Adrian Prows, MD  rosuvastatin (CRESTOR) 20 MG tablet Take 1 tablet (20 mg total) by mouth daily at 6 PM. 04/10/18  Yes Gildardo Pounds, NP  sotalol (BETAPACE) 80 MG tablet Take 1 tablet (80 mg total) by mouth 2 (two) times daily. 05/14/18  Yes Adrian Prows, MD  Blood Glucose Monitoring Suppl (TRUE METRIX METER) w/Device KIT Check blood sugars fasting and at bedtime 01/30/18   Argentina Donovan, PA-C  colchicine 0.6 MG tablet Take 1 tablet (0.6 mg total) by mouth 2 (two) times daily for 14 days. Patient not taking: Reported on 05/23/2018 03/15/18 05/23/18  Edwin Dada, MD  glucose blood (TRUE METRIX BLOOD GLUCOSE TEST) test strip Use as instructed 01/30/18   Argentina Donovan, PA-C  predniSONE (DELTASONE) 20 MG tablet Take 2 tablets (40 mg total) by mouth daily with breakfast. Patient not taking: Reported on 05/23/2018 04/10/18   Gildardo Pounds, NP  TRUEPLUS LANCETS 28G MISC Check blood sugars as  directed 01/30/18   Argentina Donovan, PA-C    Family History Family History  Problem Relation Age of Onset  . Cancer Maternal Grandfather        lung  . Diabetes Mother   . Hypertension Maternal Grandmother   . Diabetes Father   . Hearing loss Neg Hx     Social History Social History   Tobacco Use  . Smoking status: Never Smoker  . Smokeless tobacco: Never Used  Substance Use Topics  . Alcohol use: Yes    Comment: ocasionally   . Drug use: No     Allergies   Carrot oil; Celery oil; Peanut-containing drug products; and Septra [bactrim]  Review of Systems Review of Systems  Constitutional: Negative for chills and fever.  HENT: Negative for ear pain and sore throat.   Eyes: Negative for pain and visual disturbance.  Respiratory: Negative for cough and shortness of breath.   Cardiovascular: Positive for leg swelling. Negative for chest pain and palpitations.  Gastrointestinal: Negative for abdominal pain and vomiting.  Genitourinary: Negative for dysuria and hematuria.  Musculoskeletal: Negative for arthralgias and back pain.  Skin: Positive for wound. Negative for color change and rash.  Neurological: Negative for seizures and syncope.  All other systems reviewed and are negative.    Physical Exam Updated Vital Signs BP 128/84   Pulse 81   Temp 98.2 F (36.8 C) (Oral)   Resp (!) 25   SpO2 99%   Physical Exam  Constitutional: She appears well-developed and well-nourished. No distress.  Obese  HENT:  Head: Normocephalic and atraumatic.  Eyes: Conjunctivae are normal.  Neck: Neck supple.  Cardiovascular: Intact distal pulses. An irregularly irregular rhythm present. Tachycardia present.  No murmur heard. Pulmonary/Chest: Effort normal and breath sounds normal. No respiratory distress.  Abdominal: Soft. She exhibits no distension. There is no tenderness.  Musculoskeletal: She exhibits edema.  Patient with 5 x 6 cm area of induration in left axilla, with  central fluctuance and overlying warmth and tenderness.  Neurological: She is alert.  Skin: Skin is warm and dry.  Psychiatric: She has a normal mood and affect.  Nursing note and vitals reviewed.    ED Treatments / Results  Labs (all labs ordered are listed, but only abnormal results are displayed) Labs Reviewed  BASIC METABOLIC PANEL - Abnormal; Notable for the following components:      Result Value   Glucose, Bld 271 (*)    Creatinine, Ser 1.32 (*)    GFR calc non Af Amer 49 (*)    GFR calc Af Amer 57 (*)    All other components within normal limits  CBC - Abnormal; Notable for the following components:   WBC 22.6 (*)    RBC 5.39 (*)    MCH 24.3 (*)    MCHC 29.9 (*)    All other components within normal limits  LACTIC ACID, PLASMA  LACTIC ACID, PLASMA  MAGNESIUM  I-STAT BETA HCG BLOOD, ED ([facility], [facility], [facility] ONLY)    EKG EKG Interpretation  Date/Time:  Friday May 23 2018 22:46:30 EDT Ventricular Rate:  124 PR Interval:    QRS Duration: 96 QT Interval:  304 QTC Calculation: 437 R Axis:   136 Text Interpretation:  Sinus tachycardia Ventricular tachycardia, unsustained LAE, consider biatrial enlargement Probable RVH w/ secondary repol abnormality Nonspecific T abnormalities, lateral leads Confirmed by Blanchie Dessert [phone]) on 05/23/2018 10:49:57 PM   Radiology Dg Chest Portable 1 View  Result Date: 05/23/2018 CLINICAL DATA:  Atrial fibrillation. EXAM: PORTABLE CHEST 1 VIEW COMPARISON:  Chest x-ray dated May 12, 2018. FINDINGS: Stable cardiomegaly. Normal pulmonary vascularity. Low lung volumes. No focal consolidation, pleural effusion, or pneumothorax. No acute osseous abnormality. IMPRESSION: Low volume chest.  No active disease. Electronically Signed   By: Titus Dubin M.D.   On: 05/23/2018 20:44    Procedures .Marland KitchenIncision and Drainage Date/Time: 05/23/2018 10:38 PM Performed by: Keenan Bachelor, MD Authorized by: Keenan Bachelor, MD   Consent:     Consent obtained:  Verbal   Consent given by:  Patient   Risks discussed:  Bleeding, incomplete drainage, pain, damage to other organs and infection   Alternatives discussed:  No  treatment Location:    Type:  Abscess   Size:  5cmx6cm   Location: L axilla. Pre-procedure details:    Skin preparation:  Chloraprep Anesthesia (see MAR for exact dosages):    Anesthesia method:  Topical application and local infiltration   Topical anesthesia: benzocaine spray.   Local anesthetic:  Lidocaine 1% w/o epi Procedure type:    Complexity:  Simple Procedure details:    Needle aspiration: no     Incision types:  Single straight   Incision depth:  Dermal   Scalpel blade:  11   Wound management:  Probed and deloculated, irrigated with saline and extensive cleaning   Drainage:  Bloody, purulent and serosanguinous   Drainage amount:  Moderate   Wound treatment:  Wound left open   Packing materials:  1/4 in iodoform gauze   Amount 1/4" iodoform:  6" Post-procedure details:    Patient tolerance of procedure:  Tolerated well, no immediate complications .Sedation Date/Time: 05/23/2018 10:39 PM Performed by: Keenan Bachelor, MD Authorized by: Keenan Bachelor, MD   Consent:    Consent obtained:  Verbal and written   Consent given by:  Patient   Risks discussed:  Allergic reaction, dysrhythmia, inadequate sedation, nausea, prolonged hypoxia resulting in organ damage, prolonged sedation necessitating reversal, respiratory compromise necessitating ventilatory assistance and intubation and vomiting   Alternatives discussed:  Anxiolysis and analgesia without sedation Universal protocol:    Procedure explained and questions answered to patient or proxy's satisfaction: yes     Relevant documents present and verified: yes     Test results available and properly labeled: yes     Imaging studies available: yes     Required blood products, implants, devices, and special equipment available: yes     Site/side  marked: yes     Immediately prior to procedure a time out was called: yes     Patient identity confirmation method:  Arm band, verbally with patient and hospital-assigned identification number Indications:    Procedure performed:  Cardioversion   Procedure necessitating sedation performed by:  Physician performing sedation Pre-sedation assessment:    Time since last food or drink:  >6hr   ASA classification: class 2 - patient with mild systemic disease     Neck mobility: normal     Mouth opening:  3 or more finger widths   Mallampati score:  II - soft palate, uvula, fauces visible   Pre-sedation assessments completed and reviewed: airway patency, cardiovascular function, hydration status, mental status, nausea/vomiting, pain level, respiratory function and temperature     Pre-sedation assessment completed:  05/23/2018 8:00 PM Immediate pre-procedure details:    Reassessment: Patient reassessed immediately prior to procedure     Reviewed: vital signs, relevant labs/tests and NPO status     Verified: bag valve mask available, emergency equipment available, intubation equipment available, IV patency confirmed, oxygen available, reversal medications available and suction available   Procedure details (see MAR for exact dosages):    Preoxygenation:  Nasal cannula   Sedation:  Propofol   Analgesia:  None   Intra-procedure monitoring:  Blood pressure monitoring, cardiac monitor, continuous capnometry, continuous pulse oximetry, frequent LOC assessments and frequent vital sign checks   Intra-procedure events: none     Total Provider sedation time (minutes):  5 Post-procedure details:    Post-sedation assessment completed:  05/23/2018 10:41 PM   Attendance: Constant attendance by certified staff until patient recovered     Recovery: Patient returned to pre-procedure baseline     Post-sedation assessments completed  and reviewed: airway patency, cardiovascular function, hydration status, mental  status, nausea/vomiting, pain level, respiratory function and temperature     Patient tolerance:  Tolerated well, no immediate complications .Cardioversion Date/Time: 05/23/2018 10:42 PM Performed by: Keenan Bachelor, MD Authorized by: Keenan Bachelor, MD   Consent:    Consent obtained:  Verbal and written   Consent given by:  Patient   Risks discussed:  Death, induced arrhythmia and pain   Alternatives discussed:  No treatment Universal protocol:    Procedure explained and questions answered to patient or proxy's satisfaction: yes     Relevant documents present and verified: yes     Test results available and properly labeled: yes     Imaging studies available: yes     Required blood products, implants, devices, and special equipment available: yes     Site/side marked: yes     Immediately prior to procedure a time out was called: yes     Patient identity confirmed:  Verbally with patient and arm band Pre-procedure details:    Cardioversion basis:  Emergent   Rhythm:  Atrial fibrillation   Electrode placement:  Anterior-posterior Patient sedated: Yes. Refer to sedation procedure documentation for details of sedation.  Attempt one:    Cardioversion mode:  Synchronous   Waveform:  Monophasic   Shock (Joules):  120   Shock outcome:  Conversion to other rhythm Post-procedure details:    Patient status:  Awake   Patient tolerance of procedure:  Tolerated well, no immediate complications   (including critical care time)  Medications Ordered in ED Medications  lidocaine (PF) (XYLOCAINE) 1 % injection 10 mL (10 mLs Intradermal Given 05/23/18 1858)  Benzocaine (HURRCAINE) 20 % mouth spray ( Mouth/Throat Given 05/23/18 1859)  propofol (DIPRIVAN) 10 mg/mL bolus/IV push 52.1 mg (50 mg Intravenous Given 05/23/18 1941)  sodium chloride 0.9 % bolus 1,000 mL (0 mLs Intravenous Stopped 05/23/18 2050)  propofol (DIPRIVAN) 10 mg/mL bolus/IV push (20 mg Intravenous Given 05/23/18 1943)    sotalol (BETAPACE) tablet 80 mg (80 mg Oral Given 05/23/18 2131)  metoprolol tartrate (LOPRESSOR) tablet 25 mg (25 mg Oral Given 05/24/18 0001)     Initial Impression / Assessment and Plan / ED Course  I have reviewed the triage vital signs and the nursing notes.  Pertinent labs & imaging results that were available during my care of the patient were reviewed by me and considered in my medical decision making (see chart for details).      42 year old female with PMH significant for SLE on Plaquenil, recent diagnosed A. fib/flutter on 04/30/2018 started on Eliquis same day, recent hospitalization discharge on 05/14/2018 for A. fib flutter with heart failure symptoms who presents from cardiology clinic secondary to A. fib/flutter with RVR and left axillary abscess.  Patient without signs of sepsis.  Patient with left axillary abscess requiring I&D.  Patient also in A. fib/flutter with RVR, stable.  Labs and imaging performed.  Creatinine 1.32, baseline 0.9.  Leukocytosis to 22.6.  Glucose 271.  AG 11, CO2 25.  hCG less than 5.  CXR negative.  I&D as above.  Cardioversion as above.  Post revision patient still with sinus tachycardia with significant ectopy, persistently tachycardic.  Cardiology consulted recommend p.m. dose of sotalol which was given.  Patient with persistent tachycardia 1 hour after several of menstruation.  Cardiology recommends p.o. metoprolol 25 and admission to medicine for titration of metoprolol.  Cardiology around in the morning.  New patient  Patient admitted to hospitalist for  further management.  Patient seen in conjunction with my attending Dr. Maryan Rued who agrees with plan and disposition.  Final Clinical Impressions(s) / ED Diagnoses   Final diagnoses:  Abscess  Atrial fibrillation with RVR [facility])    ED Discharge Orders    None       Keenan Bachelor, MD 05/24/18 [id number]    Blanchie Dessert, MD 05/25/18 2021

## 2018-05-23 NOTE — H&P (Signed)
History and Physical    [name] STM:[id number] DOB: 06-Jul-1976 DOA: 05/23/2018  Referring MD/NP/PA: Keenan Bachelor, MD(resident) PCP: Gildardo Pounds, NP  Patient coming from: Cardiology office  Chief Complaint: Atrial fibrillation  I have personally briefly reviewed patient's old medical records in [place]   HPI: [name] is a 42 y.o. female with medical history significant of HTN, A. fib on Eliquis diagnosed 04/2018, SLE on Plaquenil; who presents with complaints of being in atrial fibrillation.  She is gone for routine follow-up appointment doctor's office and was noted to be in atrial fibrillation.  Patient also reported complaints of abscess underneath her left axilla that had progressively enlarged and became painful over the last 2 to 3 days.  Denies having any fever, chills, nausea, vomiting, palpitations, chest pain, or significant shortness of breath.  She did complain of associated symptoms of sweats.  Patient had been diagnosed with atrial fibrillation, signs of heart failure, and started on Eliquis at the end of last month after being on the cardiology service.    ED Course: Upon admission into the emergency department patient was seen to be afebrile, but in atrial fibrillation with heart rates into the 160s.  Patient was cardioverted with 120 J.  Labs revealed WBC 22.6, BUN 16, creatinine 1.32, and glucose 271.  Patient underwent I&D with wick placed.  Cardiology was consulted, and recommended giving patient her p.m. dose of sotalol along with 25 mg of metoprolol.  Cardiology would see the patient in a.m. and requested [facility] to admit.   Review of Systems  Constitutional: Positive for diaphoresis. Negative for chills and fever.  HENT: Negative for congestion and nosebleeds.   Eyes: Negative for photophobia and pain.  Respiratory: Negative for cough and shortness of breath.   Cardiovascular: Negative for chest pain, palpitations and leg swelling.    Gastrointestinal: Negative for abdominal pain.  Genitourinary: Negative for dysuria and hematuria.  Musculoskeletal: Positive for myalgias. Negative for falls.  Skin:       Positive for abscess  Neurological: Negative for loss of consciousness and weakness.  Psychiatric/Behavioral: Negative for substance abuse.    Past Medical History:  Diagnosis Date  . Abnormal Pap smear of cervix    colpo, HPV  . Allergy   . Anemia   . CHF (congestive heart failure) ([place])   . Depression    after losses  . Enlarged heart    managed by cardiology  . Fibroid   . Gestational diabetes   . Heart murmur   . Hypertension   . Infection    UTI  . Lupus ([place]) 1999    Past Surgical History:  Procedure Laterality Date  . RIGHT/LEFT HEART CATH AND CORONARY ANGIOGRAPHY N/A 03/14/2018   Procedure: RIGHT/LEFT HEART CATH AND CORONARY ANGIOGRAPHY;  Surgeon: [name], MD;  Location: [facility];  Service: Cardiovascular;  Laterality: N/A;  . THERAPEUTIC ABORTION       reports that she has never smoked. She has never used smokeless tobacco. She reports that she drinks alcohol. She reports that she does not use drugs.  Allergies  Allergen Reactions  . Carrot Oil Shortness Of Breath, Rash and Other (See Comments)    Bumps on tongue, also  . Celery Oil Shortness Of Breath, Rash and Other (See Comments)    Bumps on tongue, also  . Peanut-Containing Drug Products Anaphylaxis, Shortness Of Breath, Itching, Rash and Other (See Comments)    Bumps on tongue, also  .  Septra [Bactrim] Hives    Family History  Problem Relation Age of Onset  . Cancer Maternal Grandfather        lung  . Diabetes Mother   . Hypertension Maternal Grandmother   . Diabetes Father   . Hearing loss Neg Hx     Prior to Admission medications   Medication Sig Start Date End Date Taking? Authorizing Provider  acetaminophen (TYLENOL) 325 MG tablet Take 2 tablets (650 mg total) by mouth every 4 (four) hours as  needed for headache or mild pain. 12/28/17  Yes Georgette Shell, MD  amLODipine (NORVASC) 10 MG tablet Take 1 tablet (10 mg total) by mouth daily. 03/31/12  Yes West, Emily, PA-C  apixaban (ELIQUIS) 5 MG TABS tablet Take 1 tablet (5 mg total) by mouth 2 (two) times daily. 05/14/18  Yes Adrian Prows, MD  colchicine 0.6 MG tablet Take 0.6 mg by mouth 2 (two) times daily.   Yes [provider]  furosemide (LASIX) 40 MG tablet Take 1 tablet (40 mg total) by mouth 2 (two) times daily. 01/18/18  Yes Rai, Ripudeep K, MD  glimepiride (AMARYL) 2 MG tablet Take 1 tablet (2 mg total) by mouth every morning. 02/25/18 02/25/19 Yes Gildardo Pounds, NP  hydroxychloroquine (PLAQUENIL) 200 MG tablet Take 1 tablet (200 mg total) by mouth 2 (two) times daily. 01/18/18  Yes Rai, Ripudeep K, MD  insulin glargine (LANTUS) 100 UNIT/ML injection Inject 0.12 mLs (12 Units total) into the skin daily. Patient taking differently: Inject 12 Units into the skin daily before breakfast.  03/15/18  Yes Danford, Suann Larry, MD  lisinopril (PRINIVIL,ZESTRIL) 20 MG tablet Take 1 tablet (20 mg total) by mouth daily. 04/10/18  Yes Gildardo Pounds, NP  metFORMIN (GLUCOPHAGE) 1000 MG tablet Take 1 tablet (1,000 mg total) by mouth 2 (two) times daily with a meal. 01/18/18  Yes Rai, Ripudeep K, MD  omeprazole (PRILOSEC) 20 MG capsule Take 1 capsule (20 mg total) by mouth daily. 02/25/18 06/25/18 Yes Gildardo Pounds, NP  potassium chloride (K-DUR) 10 MEQ tablet Take 1 tablet (10 mEq total) by mouth 2 (two) times daily. 05/14/18  Yes Adrian Prows, MD  rosuvastatin (CRESTOR) 20 MG tablet Take 1 tablet (20 mg total) by mouth daily at 6 PM. 04/10/18  Yes Gildardo Pounds, NP  sotalol (BETAPACE) 80 MG tablet Take 1 tablet (80 mg total) by mouth 2 (two) times daily. 05/14/18  Yes Adrian Prows, MD  Blood Glucose Monitoring Suppl (TRUE METRIX METER) w/Device KIT Check blood sugars fasting and at bedtime 01/30/18   Argentina Donovan, PA-C  colchicine 0.6  MG tablet Take 1 tablet (0.6 mg total) by mouth 2 (two) times daily for 14 days. Patient not taking: Reported on 05/23/2018 03/15/18 05/23/18  Edwin Dada, MD  glucose blood (TRUE METRIX BLOOD GLUCOSE TEST) test strip Use as instructed 01/30/18   Argentina Donovan, PA-C  predniSONE (DELTASONE) 20 MG tablet Take 2 tablets (40 mg total) by mouth daily with breakfast. Patient not taking: Reported on 05/23/2018 04/10/18   Gildardo Pounds, NP  TRUEPLUS LANCETS 28G MISC Check blood sugars as directed 01/30/18   Argentina Donovan, PA-C    Physical Exam:  Constitutional: Obese female NAD, calm, comfortable Vitals:   05/23/18 2230 05/23/18 2245 05/23/18 2300 05/23/18 2345  BP: 126/88 120/82 (!) 135/96 128/84  Pulse: 80 (!) 44  81  Resp: (!) 22 (!) 24 12 (!) 25  Temp:  TempSrc:      SpO2: 99% 98%  99%   Eyes: PERRL, lids and conjunctivae normal ENMT: Mucous membranes are moist. Posterior pharynx clear of any exudate or lesions.  Neck: normal, supple, no masses, no thyromegaly Respiratory: clear to auscultation bilaterally, no wheezing, no crackles. Normal respiratory effort. No accessory muscle use.  Cardiovascular: Tachycardiac, no murmurs / rubs / gallops.  Trace lower extremity edema. 2+ pedal pulses. No carotid bruits.  Abdomen: no tenderness, no masses palpated. No hepatosplenomegaly. Bowel sounds positive.  Musculoskeletal: no clubbing / cyanosis. No joint deformity upper and lower extremities. Good ROM, no contractures. Normal muscle tone.  Skin: Left axilla s/p I&D with induration of skin present. Neurologic: CN 2-12 grossly intact. Sensation intact, DTR normal. Strength 5/5 in all 4.  Psychiatric: Normal judgment and insight. Alert and oriented x 3. Normal mood.     Labs on Admission: I have personally reviewed following labs and imaging studies  CBC: Recent Labs  Lab 05/23/18 1623  WBC 22.6*  HGB 13.1  HCT 43.8  MCV 81.3  PLT 875   Basic Metabolic  Panel: Recent Labs  Lab 05/23/18 1623  NA 139  K 3.5  CL 103  CO2 25  GLUCOSE 271*  BUN 16  CREATININE 1.32*  CALCIUM 9.3   GFR: Estimated Creatinine Clearance: 60.5 mL/min (A) (by C-G formula based on SCr of 1.32 mg/dL (H)). Liver Function Tests: No results for input(s): AST, ALT, ALKPHOS, BILITOT, PROT, ALBUMIN in the last 168 hours. No results for input(s): LIPASE, AMYLASE in the last 168 hours. No results for input(s): AMMONIA in the last 168 hours. Coagulation Profile: No results for input(s): INR, PROTIME in the last 168 hours. Cardiac Enzymes: No results for input(s): CKTOTAL, CKMB, CKMBINDEX, TROPONINI in the last 168 hours. BNP (last 3 results) No results for input(s): PROBNP in the last 8760 hours. HbA1C: No results for input(s): HGBA1C in the last 72 hours. CBG: No results for input(s): GLUCAP in the last 168 hours. Lipid Profile: No results for input(s): CHOL, HDL, LDLCALC, TRIG, CHOLHDL, LDLDIRECT in the last 72 hours. Thyroid Function Tests: No results for input(s): TSH, T4TOTAL, FREET4, T3FREE, THYROIDAB in the last 72 hours. Anemia Panel: No results for input(s): VITAMINB12, FOLATE, FERRITIN, TIBC, IRON, RETICCTPCT in the last 72 hours. Urine analysis:    Component Value Date/Time   COLORURINE YELLOW 01/02/2017 [place] 01/02/2017 1433   LABSPEC 1.020 07/19/2017 1317   PHURINE 7.0 07/19/2017 1317   GLUCOSEU 100 (A) 07/19/2017 1317   HGBUR MODERATE (A) 07/19/2017 1317   BILIRUBINUR NEGATIVE 07/19/2017 1317   KETONESUR NEGATIVE 07/19/2017 1317   PROTEINUR 30 (A) 07/19/2017 1317   UROBILINOGEN 1.0 07/19/2017 1317   NITRITE NEGATIVE 07/19/2017 1317   LEUKOCYTESUR SMALL (A) 07/19/2017 1317   Sepsis Labs: No results found for this or any previous visit (from the past 240 hour(s)).   Radiological Exams on Admission: Dg Chest Portable 1 View  Result Date: 05/23/2018 CLINICAL DATA:  Atrial fibrillation. EXAM: PORTABLE CHEST 1 VIEW  COMPARISON:  Chest x-ray dated May 12, 2018. FINDINGS: Stable cardiomegaly. Normal pulmonary vascularity. Low lung volumes. No focal consolidation, pleural effusion, or pneumothorax. No acute osseous abnormality. IMPRESSION: Low volume chest.  No active disease. Electronically Signed   By: Titus Dubin M.D.   On: 05/23/2018 20:44    EKG: Independently reviewed.  Recheck after cardioversion showed sinus tachycardia at 124 bpm.  Assessment/Plan A. fib with RVR on chronic anticoagulation: Acute on chronic.  Patient had just recently been hospitalized on 9/11 for the same.  Status post cardioversion in the emergency department. - Admit to a telemetry bed - Continue sotalol  - Check magnesium level and replace if needed - Metoprolol 2.5 mg IV prn heart rates greater than 120 overnight  Sepsis secondary to left axillary abscess and cellulitis: Patient presents with WBC elevated at 22.6 with fever up to 100.5 F and sweats found to have abscess.  Patient underwent I&D in the emergency department.  No initial lactic acid obtained or blood cultures.  - Check blood cultures x2 - Check  lactic acid - Clindamycin 600 mg IV, de-escalate when medically appropriate - Check soft tissue ultrasound of left axilla - Consult general surgery, if abscess still present  Acute kidney injury: Acute.  Initial creatinine noted to be 1.32 with BUN 16.  Suspect hypoperfusion secondary to A. fib with RVR. - Recheck kidney function in a.m.  Diabetes mellitus type 2: Uncontrolled.  Last hemoglobin A1c noted to be 9.1 on 03/13/2018.  Patient presents with initial blood glucose elevated at 271. - Hypoglycemic protocol - Hold metformin and Amaryl - Continue Lantus 12 units subcutaneous daily - CBGs q. before meals and at bedtime with moderate sliding scale insulin  Diastolic CHF: Patient does not appear to be acutely fluid overloaded at this time.  Essential hypertension - Continue amlodipine, furosemide, and  lisinopril  Lupus - Continue Plaquenil  DVT prophylaxis: Continue Eliquis Code Status: Full Family Communication: No family present at bedside Disposition Plan: Likely discharge home in 1 to 2 days. Consults called: Cardiology Admission status: Observation  Norval Morton MD [facility] Pager [phone]   If 7PM-7AM, please contact night-coverage [email or web address] Password [email or web address]  05/23/2018, 11:47 PM

## 2018-05-23 NOTE — ED Notes (Signed)
Patient denies pain and is resting comfortably.  

## 2018-05-23 NOTE — ED Triage Notes (Addendum)
Pt presents for evaluation of abscess to L axilla starting 2-3 days ago. States worsened yesterday. Pt reports she also was seen by cardiologist today regarding palpitations and told to come here for further evaluation. Denies cp/sob. Pt in afib RVR/flutter, supposed to have ablation 10/29, on elliquis.

## 2018-05-23 NOTE — Sedation Documentation (Signed)
Cardioversion delivered at 120j.

## 2018-05-23 NOTE — Consult Note (Signed)
[name] 05/23/2018 11:45 AM Location: [facility] Patient #: [id number] DOB: [birth year] Single / Language: [name] / Race: Black or African Pittman Female   History of Present Illness Katherine Mormon MD; 05/23/2018 2:05 PM) The patient is a 42 year old female who presents for a Follow-up for Atrial flutter. 42 year old [name] female with long-standing history of lupus, hypertension, type 2 diabetes mellitus, with recurrent pericardial effusion withoiut tamponade physiology (cath 03/2018), nonobstructive CAD, now with atrial flutter.  Patient was admittd to the hospital in early 05/2018 for Aflutter with RVR. She was stated on sotalol with conversion to sinus rhythm. Patient is scheduled for flutter ablation by Dr. Lovena Le on 06/10/2018.  Patient is here for hospital follow up. she has not had any significant shortness of breath, leg swelling since hospital discharge. Today, she noticed that her heart rate was racing fast. She also has noticed swelling just under her left armpit which is painful.   Problem List/Past Medical [name]; 05/23/2018 11:51 AM) Laboratory examination (Z01.89)  Labs 03/15/2018: H/H 12/38. MCV 77. Platelets 339 ESR 41 elevated Glucose 208. BUN/creatinine 20/0.78. EGFR normal. Sodium 137, potassium 3.7. Troponin 0.03. BNP 229  Labs 12/28/2017: H/H 11.9/36.1. MCV 75. Platelets 396 Glucose 131. BUN/creatinine 18/0.64 eGFR normal. Na/K 137/3.7 ds DNA ab 2 (Range 0-9) C3 144 (82-167) C4 22 (14-44) Pericardial effusion (I31.3)  Echocardiogram 01/02/2018: Left ventricle cavity is normal in size. Severe concentric hypertrophy of the left ventricle. Normal global wall motion. Doppler evidence of grade II (pseudonormal) diastolic dysfunction. Diastolic dysfunction findings suggests elevated LA/LV end diastolic pressure. Calculated EF 55%. Left atrial cavity is severely dilated at 5.5 cm. No aortic valve regurgitation noted. Mild  aortic valve leaflet thickening. Mild aortic valve stenosis. Aortic valve peak pressure gradient of 26 and mean gradient of 14 mmHg, calculated aortic valve area 1.51 cm. Moderate circumferential clear fluid pericardial effusion without tamponade physiology. Lupus (M32.9)  Benign essential hypertension (I10)  EKG 04/30/2018: Atrial flutter with variable conduction. Leftward axis. Poor R wave progression. Intermittent aberrant conduction. Controlled type 2 diabetes mellitus without complication, without long-term current use of insulin (E11.9)  Mild aortic stenosis (I35.0)  (HFpEF) heart failure with preserved ejection fraction (I50.30)  Paroxysmal atrial flutter (I48.92)  CHA2DS2VASc score 3: Annual stroke risk 3.2%  Allergies [name]; 05/23/2018 11:51 AM) [name] *ANTI-INFECTIVE AGENTS - MISC.*  Hives.  Family History [name]; 05/23/2018 11:51 AM) Mother  In stable health. DM, Stent, no heart attack, no strokes Father  In stable health. DM, no heart attacks or strokes, no known cardiovascular conditions Sister 1  younger-no contact Brother 1  younger-healthy  Social History [name]; 05/23/2018 11:51 AM) Current tobacco use  Never smoker. Alcohol Use  Occasional alcohol use. Marital status  Married. Living Situation  Lives with spouse. Number of Children  2.  Past Surgical History [name]; 05/23/2018 11:51 AM) None [01/10/2018]:  Medication History [name]; 05/23/2018 12:01 PM) Colchicine (0.6MG Capsule, 1 (one) Capsule Oral twice daily, Taken starting 04/30/2018) Active. Eliquis (5MG Tablet, 1 (one) Tablet Oral twice daily, Taken starting 04/30/2018) Active. Aspirin 81 (81MG Tablet Chewable, 1 (one) Tablet Tablet Tablet Oral Once daily, Taken starting 01/10/2018) Active. Hydroxychloroquine Sulfate (200MG Tablet, 1 Oral two times daily) Active. metFORMIN HCl (1000MG Tablet, 1 Oral two times daily) Active. tiZANidine HCl  (4MG Tablet, Oral every four hours prn) Active. amLODIPine Besylate (10MG Tablet, 1 Oral daily) Active. Tylenol (325MG Tablet, Oral prn) Active. predniSONE (10MG (21) Tab Ther Pack, Oral) Active. (  as directed) Furosemide (40MG Tablet, 1 Oral Once daily) Active. glipizide 2 mg (2 mg Once daily) Active. Sotalol HCl (80MG Tablet, 1 Oral two times daily) Active. Potassium Chloride ER (10MEQ Tablet ER, 1 Oral two times daily) Active. Medications Reconciled (verbally)  Diagnostic Studies History [name]; 06/15/18 11:51 AM) Echocardiogram  01/02/2018: Left ventricle cavity is normal in size. Severe concentric hypertrophy of the left ventricle. Normal global wall motion. Doppler evidence of grade II (pseudonormal) diastolic dysfunction. Diastolic dysfunction findings suggests elevated LA/LV end diastolic pressure. Calculated EF 55%. Left atrial cavity is severely dilated at 5.5 cm. No aortic valve regurgitation noted. Mild aortic valve leaflet thickening. Mild aortic valve stenosis. Aortic valve peak pressure gradient of 26 and mean gradient of 14 mmHg, calculated aortic valve area 1.51 cm. Moderate circumferential clear fluid pericardial effusion without tamponade physiology. Treadmill stress test [2010]: Cardiac MRI  03/13/2018: 1. Normal LV size and function EF 65% moderate LVH 2. Moderate to Large circumferential pericardial effusion biggest in the posterior and lateral dimensions with no diastolic RV collapse 3. Normal appearing pericardium 4. Mild subepicardial gadolinium uptake in the basal and mid inferior wall and basal antero septum consistent with mild myocarditis 5. Tri leaflet AV with some restricted motion suggest echo correlation for degree of AS 6. Severe LAE Heart Cath  03/14/2018: Prox LAD 50% stenosis Bordelrine pulmonary hypertension No tampoande phsyiology    Review of Systems Katherine Mormon MD; June 15, 2018 2:05 PM) General Not Present-  Appetite Loss and Weight Gain. Skin Note: Swelling under her left armpit Respiratory Not Present- Chronic Cough and Wakes up from Sleep Wheezing or Short of Breath. Cardiovascular Present- Hypertension (Controlled). Not Present- Chest Pain, Difficulty Breathing Lying Down, Difficulty Breathing On Exertion, Edema, Orthopnea and Palpitations. Gastrointestinal Not Present- Black, Tarry Stool and Difficulty Swallowing. Musculoskeletal Present- Joint Pain. Not Present- Decreased Range of Motion and Muscle Atrophy. Note: Myalgias Neurological Not Present- Attention Deficit. Psychiatric Not Present- Personality Changes and Suicidal Ideation. Endocrine Not Present- Cold Intolerance and Heat Intolerance. Hematology Not Present- Abnormal Bleeding. All other systems negative  Vitals [name]; 2018/06/15 12:03 PM) 06-15-18 11:56 AM Weight: 225.5 lb Height: 60in Body Surface Area: 1.96 m Body Mass Index: 44.04 kg/m  Pulse: 123 (Regular)  P.OX: 98% (Room air) BP: 128/80 (Sitting, Left Arm, Standard)       Physical Exam Katherine Mormon MD; 06-15-2018 2:05 PM) General Mental Status-Alert. General Appearance-Cooperative and Appears stated age. Build & Nutrition-Moderately built. Note: Hyperpigmented rash on forsum on both hands   Integumentary Note: Left subaxillary tender swelling 2-3 inches in diameter, likely abscess.   Head and Neck Thyroid Gland Characteristics - normal size and consistency and no palpable nodules.  Chest and Lung Exam Chest and lung exam reveals -quiet, even and easy respiratory effort with no use of accessory muscles, non-tender and on auscultation, normal breath sounds, no adventitious sounds.  Cardiovascular Cardiovascular examination reveals -carotid auscultation reveals no bruits, abdominal aorta auscultation reveals no bruits and no prominent pulsation and femoral artery auscultation bilaterally reveals normal pulses, no  bruits, no thrills. Auscultation Rhythm - Irregularly irregular. Murmurs & Other Heart Sounds: Murmur - Location - Aortic Area. Timing - Early systolic. Grade - II/VI. Character - Crescendo/Decrescendo.  Abdomen Palpation/Percussion Normal exam - Non Tender and No hepatosplenomegaly.  Peripheral Vascular Lower Extremity Palpation - Edema - Bilateral - No edema. Dorsalis pedis pulse - Bilateral - Normal. Posterior tibial pulse - Bilateral - Normal. Carotid arteries - Bilateral-No Carotid bruit.  Neurologic Neurologic  evaluation reveals -alert and oriented x 3 with no impairment of recent or remote memory. Motor-Grossly intact without any focal deficits.  Musculoskeletal Global Assessment Left Lower Extremity - no deformities, masses or tenderness, no known fractures. Right Lower Extremity - no deformities, masses or tenderness, no known fractures.    Assessment & Plan Joya Gaskins Esther Hardy MD; 05/23/2018 2:11 PM) Abscess (L02.91) Paroxysmal atrial flutter (I48.92) Story: CHA2DS2VASc score 3: Annual stroke risk 3.2%  EKG 05/23/2018: Atrial flutter with rapid ventricular rate 150 bpm. Inferolateral T-wave inversions, cannot exclude ischemia. Current Plans Complete electrocardiogram (93000) Pericardial effusion (I31.3) Story: Echocardiogram 01/02/2018: Left ventricle cavity is normal in size. Severe concentric hypertrophy of the left ventricle. Normal global wall motion. Doppler evidence of grade II (pseudonormal) diastolic dysfunction. Diastolic dysfunction findings suggests elevated LA/LV end diastolic pressure. Calculated EF 55%. Left atrial cavity is severely dilated at 5.5 cm. No aortic valve regurgitation noted. Mild aortic valve leaflet thickening. Mild aortic valve stenosis. Aortic valve peak pressure gradient of 26 and mean gradient of 14 mmHg, calculated aortic valve area 1.51 cm. Moderate circumferential clear fluid pericardial effusion without tamponade  physiology. Impression: EKG 01/27/2018: Sinus rhythm 86 bpm. Normal axis. Multifocal PACs. Nonspecific anterolateral T-wave flattening.. No electrical alternans seen. (HFpEF) heart failure with preserved ejection fraction (I50.30) Mild aortic stenosis (I35.0) Lupus (M32.9) Benign essential hypertension (I10) Story: EKG 04/30/2018: Atrial flutter with variable conduction. Leftward axis. Poor R wave progression. Intermittent aberrant conduction. Controlled type 2 diabetes mellitus without complication, without long-term current use of insulin (E11.9)  Note:Recommendations:  42 year old African Pittman female with long-standing history of lupus, hypertension, type 2 diabetes mellitus, with recurrent pericardial effusion withoiut tamponade physiology (cath 03/2018), nonobstructive CAD, atrial flutter w.RVR< now with left subaxillary abscess.  Left subaxillary abscess: She does not appear toxic/septic in appearance. However, large abscess in a diabetic patient is concerning. She will likely need incision and drainage. I have asked her to go to [facility] emergency room and have personally spoken with ED physician Dr.Nathan Alvino Chapel regarding the same.  Pericardial effusion: Clinically, not in tamponade.  Atrial flutter: CHA2DS2VASc score 3: Annual stroke risk 3.2%. Recommend cardioversion in the ED under deep sedation supervised by ED physician. Continie anticoagulation. Plan for ablation on 10/29 by Dr. Crissie Sickles. Continue sotalol 80 mg bid. May need uptitration after ensuring QTc is normal while in sinus rhythm. After abscess I & D and sucssessful cardivoersion, patient may be discharged home, unless any significant lab abnormalities discovered.  CAD: Prox LAD 50% lesion. Continue risk factor modification with blood pressure control and statin Not on aspirin due to risk of bleeding, while on eliquis.  Mild AS: Stable.  Hypertension: Controlled.  Type 2 DM: Managemnent per  PCP  Lupus: Follow up with rheumatology.  Cc Dr Chrystie Nose, [place] [facility] Cc Crissie Sickles, MD  Signed electronically by Katherine Mormon, MD (05/23/2018 2:11 PM)

## 2018-05-23 NOTE — ED Notes (Signed)
ED Provider at bedside. 

## 2018-05-24 ENCOUNTER — Observation Stay (HOSPITAL_COMMUNITY): Payer: Self-pay

## 2018-05-24 ENCOUNTER — Other Ambulatory Visit: Payer: Self-pay

## 2018-05-24 ENCOUNTER — Encounter (HOSPITAL_COMMUNITY): Payer: Self-pay | Admitting: Emergency Medicine

## 2018-05-24 DIAGNOSIS — L03119 Cellulitis of unspecified part of limb: Secondary | ICD-10-CM

## 2018-05-24 DIAGNOSIS — I4891 Unspecified atrial fibrillation: Secondary | ICD-10-CM | POA: Diagnosis present

## 2018-05-24 DIAGNOSIS — A419 Sepsis, unspecified organism: Secondary | ICD-10-CM | POA: Diagnosis present

## 2018-05-24 DIAGNOSIS — L02419 Cutaneous abscess of limb, unspecified: Secondary | ICD-10-CM | POA: Diagnosis present

## 2018-05-24 LAB — CBC
HCT: 39.2 % (ref 36.0–46.0)
Hemoglobin: 12.4 g/dL (ref 12.0–15.0)
MCH: 25.3 pg — ABNORMAL LOW (ref 26.0–34.0)
MCHC: 31.6 g/dL (ref 30.0–36.0)
MCV: 80 fL (ref 80.0–100.0)
Platelets: 268 10*3/uL (ref 150–400)
RBC: 4.9 MIL/uL (ref 3.87–5.11)
RDW: 14.8 % (ref 11.5–15.5)
WBC: 18.5 10*3/uL — ABNORMAL HIGH (ref 4.0–10.5)
nRBC: 0 % (ref 0.0–0.2)

## 2018-05-24 LAB — BASIC METABOLIC PANEL
Anion gap: 15 (ref 5–15)
BUN: 14 mg/dL (ref 6–20)
CO2: 21 mmol/L — ABNORMAL LOW (ref 22–32)
Calcium: 9.1 mg/dL (ref 8.9–10.3)
Chloride: 103 mmol/L (ref 98–111)
Creatinine, Ser: 0.91 mg/dL (ref 0.44–1.00)
GFR calc Af Amer: >60 mL/min (ref >60)
GFR calc non Af Amer: >60 mL/min (ref >60)
Glucose, Bld: 235 mg/dL — ABNORMAL HIGH (ref 70–99)
Potassium: 3.3 mmol/L — ABNORMAL LOW (ref 3.5–5.1)
Sodium: 139 mmol/L (ref 135–145)

## 2018-05-24 LAB — LACTIC ACID, PLASMA: Lactic Acid, Venous: 1.8 mmol/L (ref 0.5–1.9)

## 2018-05-24 LAB — GLUCOSE, CAPILLARY
Glucose-Capillary: 344 mg/dL — ABNORMAL HIGH (ref 70–99)
Glucose-Capillary: 142 mg/dL — ABNORMAL HIGH (ref 70–99)
Glucose-Capillary: 153 mg/dL — ABNORMAL HIGH (ref 70–99)

## 2018-05-24 LAB — MAGNESIUM: Magnesium: 1.3 mg/dL — ABNORMAL LOW (ref 1.7–2.4)

## 2018-05-24 MED ORDER — CLINDAMYCIN PHOSPHATE 600 MG/50ML IV SOLN
600.0000 mg | Freq: Four times a day (QID) | INTRAVENOUS | Status: DC
  Administered 2018-05-24 – 2018-05-25 (×6): 600 mg via INTRAVENOUS
  Filled 2018-05-24 (×6): qty 50

## 2018-05-24 MED ORDER — SOTALOL HCL 80 MG PO TABS
80.0000 mg | ORAL_TABLET | Freq: Two times a day (BID) | ORAL | Status: DC
  Administered 2018-05-24 – 2018-05-25 (×3): 80 mg via ORAL
  Filled 2018-05-24 (×4): qty 1

## 2018-05-24 MED ORDER — INSULIN ASPART 100 UNIT/ML ~~LOC~~ SOLN
0.0000 [IU] | Freq: Three times a day (TID) | SUBCUTANEOUS | Status: DC
  Administered 2018-05-24 (×2): 3 [IU] via SUBCUTANEOUS
  Administered 2018-05-24: 2 [IU] via SUBCUTANEOUS
  Administered 2018-05-25 (×2): 3 [IU] via SUBCUTANEOUS
  Filled 2018-05-24 (×4): qty 1

## 2018-05-24 MED ORDER — METOPROLOL TARTRATE 25 MG PO TABS: 25.0000 mg | ORAL_TABLET | Freq: Two times a day (BID) | ORAL | Status: DC

## 2018-05-24 MED ORDER — INSULIN GLARGINE 100 UNIT/ML ~~LOC~~ SOLN
16.0000 [IU] | Freq: Every day | SUBCUTANEOUS | Status: DC
  Administered 2018-05-25: 16 [IU] via SUBCUTANEOUS
  Filled 2018-05-24 (×2): qty 0.16

## 2018-05-24 MED ORDER — POTASSIUM CHLORIDE CRYS ER 20 MEQ PO TBCR
40.0000 meq | EXTENDED_RELEASE_TABLET | ORAL | Status: AC
  Administered 2018-05-24: 40 meq via ORAL
  Filled 2018-05-24: qty 2

## 2018-05-24 MED ORDER — INSULIN ASPART 100 UNIT/ML ~~LOC~~ SOLN
0.0000 [IU] | Freq: Every day | SUBCUTANEOUS | Status: DC
  Administered 2018-05-24: 4 [IU] via SUBCUTANEOUS
  Filled 2018-05-24: qty 1

## 2018-05-24 MED ORDER — INSULIN GLARGINE 100 UNIT/ML ~~LOC~~ SOLN
12.0000 [IU] | Freq: Every day | SUBCUTANEOUS | Status: DC
  Administered 2018-05-24: 12 [IU] via SUBCUTANEOUS
  Filled 2018-05-24: qty 0.12

## 2018-05-24 MED ORDER — ACETAMINOPHEN 325 MG PO TABS
650.0000 mg | ORAL_TABLET | Freq: Four times a day (QID) | ORAL | Status: DC | PRN
  Administered 2018-05-24: 650 mg via ORAL
  Filled 2018-05-24: qty 2

## 2018-05-24 MED ORDER — ACETAMINOPHEN 650 MG RE SUPP: 650.0000 mg | Freq: Four times a day (QID) | RECTAL | Status: DC | PRN

## 2018-05-24 MED ORDER — ALBUTEROL SULFATE (2.5 MG/3ML) 0.083% IN NEBU: 2.5000 mg | INHALATION_SOLUTION | RESPIRATORY_TRACT | Status: DC | PRN

## 2018-05-24 MED ORDER — HYDROCODONE-ACETAMINOPHEN 5-325 MG PO TABS
1.0000 | ORAL_TABLET | Freq: Four times a day (QID) | ORAL | Status: DC | PRN
  Administered 2018-05-24 – 2018-05-25 (×3): 1 via ORAL
  Filled 2018-05-24 (×3): qty 1

## 2018-05-24 MED ORDER — APIXABAN 5 MG PO TABS
5.0000 mg | ORAL_TABLET | Freq: Two times a day (BID) | ORAL | Status: DC
  Administered 2018-05-24 – 2018-05-25 (×4): 5 mg via ORAL
  Filled 2018-05-24 (×4): qty 1

## 2018-05-24 MED ORDER — MAGNESIUM SULFATE 2 GM/50ML IV SOLN
2.0000 g | Freq: Once | INTRAVENOUS | Status: AC
  Administered 2018-05-24: 2 g via INTRAVENOUS
  Filled 2018-05-24: qty 50

## 2018-05-24 MED ORDER — AMLODIPINE BESYLATE 10 MG PO TABS
10.0000 mg | ORAL_TABLET | Freq: Every day | ORAL | Status: DC
  Administered 2018-05-24 – 2018-05-25 (×2): 10 mg via ORAL
  Filled 2018-05-24 (×2): qty 1

## 2018-05-24 MED ORDER — PANTOPRAZOLE SODIUM 40 MG PO TBEC
40.0000 mg | DELAYED_RELEASE_TABLET | Freq: Every day | ORAL | Status: DC
  Administered 2018-05-24 – 2018-05-25 (×2): 40 mg via ORAL
  Filled 2018-05-24 (×3): qty 1

## 2018-05-24 MED ORDER — ROSUVASTATIN CALCIUM 20 MG PO TABS
20.0000 mg | ORAL_TABLET | Freq: Every day | ORAL | Status: DC
  Administered 2018-05-24 – 2018-05-25 (×2): 20 mg via ORAL
  Filled 2018-05-24 (×2): qty 1

## 2018-05-24 MED ORDER — GLIMEPIRIDE 2 MG PO TABS
2.0000 mg | ORAL_TABLET | ORAL | Status: DC
  Administered 2018-05-25: 2 mg via ORAL
  Filled 2018-05-24: qty 1

## 2018-05-24 MED ORDER — HYDROCODONE-ACETAMINOPHEN 5-325 MG PO TABS
1.0000 | ORAL_TABLET | ORAL | Status: AC
  Administered 2018-05-24: 1 via ORAL
  Filled 2018-05-24: qty 1

## 2018-05-24 MED ORDER — POTASSIUM CHLORIDE CRYS ER 20 MEQ PO TBCR
20.0000 meq | EXTENDED_RELEASE_TABLET | Freq: Two times a day (BID) | ORAL | Status: DC
  Administered 2018-05-24 – 2018-05-25 (×2): 20 meq via ORAL
  Filled 2018-05-24 (×2): qty 1

## 2018-05-24 MED ORDER — HYDROXYCHLOROQUINE SULFATE 200 MG PO TABS
200.0000 mg | ORAL_TABLET | Freq: Two times a day (BID) | ORAL | Status: DC
  Administered 2018-05-24 – 2018-05-25 (×3): 200 mg via ORAL
  Filled 2018-05-24 (×4): qty 1

## 2018-05-24 MED ORDER — LISINOPRIL 20 MG PO TABS
20.0000 mg | ORAL_TABLET | Freq: Every day | ORAL | Status: DC
  Filled 2018-05-24 (×2): qty 1

## 2018-05-24 MED ORDER — FUROSEMIDE 40 MG PO TABS
40.0000 mg | ORAL_TABLET | Freq: Two times a day (BID) | ORAL | Status: DC
  Administered 2018-05-24 – 2018-05-25 (×3): 40 mg via ORAL
  Filled 2018-05-24 (×4): qty 1

## 2018-05-24 MED ORDER — POTASSIUM CHLORIDE CRYS ER 10 MEQ PO TBCR
10.0000 meq | EXTENDED_RELEASE_TABLET | Freq: Two times a day (BID) | ORAL | Status: DC
  Administered 2018-05-24: 10 meq via ORAL
  Filled 2018-05-24: qty 1

## 2018-05-24 MED ORDER — SODIUM CHLORIDE 0.9% FLUSH
3.0000 mL | Freq: Two times a day (BID) | INTRAVENOUS | Status: DC
  Administered 2018-05-24 – 2018-05-25 (×4): 3 mL via INTRAVENOUS

## 2018-05-24 NOTE — Progress Notes (Signed)
EKG and telemetry reviewed. Sinus tachycardia with frequent PAC's in the setting of sepsis. Agree with management of sepsis as per the primary team. I would not increase sotalol further given prolonged QTc interval. Continue 80 mg bid. I am hopeful that her sinus tachycardia will improve once her sepsis improves.  [name], MD [facility] Pager: [phone] Office: [phone] If no answer Cell [phone]

## 2018-05-24 NOTE — Progress Notes (Signed)
Progress Note    [name]  HDQ:[id number] DOB: [birth year]  DOA: 05/23/2018 PCP: Gildardo Pounds, NP    Brief Narrative:     Medical records reviewed and are as summarized below:  [name] is an 42 y.o. female with medical history significant of HTN, A. fib on Eliquis diagnosed 04/2018, SLE on Plaquenil; who presents with complaints of being in atrial fibrillation.  She is gone for routine follow-up appointment doctor's office and was noted to be in atrial fibrillation.  Patient also reported complaints of abscess underneath her left axilla that had progressively enlarged and became painful over the last 2 to 3 days.  Denies having any fever, chills, nausea, vomiting, palpitations, chest pain, or significant shortness of breath.  She did complain of associated symptoms of sweats.  Patient had been diagnosed with atrial fibrillation, signs of heart failure, and started on Eliquis at the end of last month after being on the cardiology service.   Assessment/Plan:   Principal Problem:   Atrial fibrillation with RVR (HCC) Active Problems:   Lupus (HCC)   Diabetes (HCC)   Chronic diastolic CHF (congestive heart failure) (HCC)   Sepsis (HCC)   Cellulitis and abscess of upper extremity  A. fib with RVR on chronic anticoagulation: Acute on chronic.  Patient had just recently been hospitalized on 9/11 for the same.  Status post cardioversion in the emergency department. - Admit to a telemetry bed - Continue sotalol  - replace Mg and K -tele show sinus tachy? With PACs -needs OSA study as outpatient  Sepsis secondary to left axillary abscess and cellulitis: Patient presents with WBC elevated at 22.6 with fever up to 100.5 F and sweats found to have abscess.  Patient underwent I&D in the emergency department.  No initial lactic acid obtained or blood cultures.  - Clindamycin 600 mg IV, de-escalate when medically appropriate - soft tissue ultrasound of left axilla does  not show remaining abscess -add warm compresses -WBC count slowly trending down  Acute kidney injury: Acute.  Initial creatinine noted to be 1.32 with BUN 16.  Suspect hypoperfusion secondary to A. fib with RVR. -resolved  Diabetes mellitus type 2: Uncontrolled.  Last hemoglobin A1c noted to be 9.1 on 03/13/2018.  Patient presents with initial blood glucose elevated at 271. - resume amaryl - Continue Lantus 12 units subcutaneous daily - CBGs q. before meals and at bedtime with moderate sliding scale insulin  Chronic Diastolic CHF: Patient does not appear to be acutely fluid overloaded at this time.  Essential hypertension - Continue amlodipine, furosemide, and lisinopril  Lupus - Continue Plaquenil  obesity Body mass index is 44.99 kg/m.   Family Communication/Anticipated D/C date and plan/Code Status   DVT prophylaxis: eliquis Code Status: Full Code.  Family Communication:  Disposition Plan:    Medical Consultants:    cardiology  Subjective:   sleepy  Objective:    Vitals:   05/24/18 0207 05/24/18 0616 05/24/18 1053 05/24/18 1230  BP: (!) 142/102 125/72 113/90 (!) 125/95  Pulse: (!) 117 100 (!) 115 97  Resp: 20 14  16   Temp: (!) 100.5 F (38.1 C) 98.7 F (37.1 C)  97.7 F (36.5 C)  TempSrc: Oral Oral  Oral  SpO2: 97% 99%  99%  Weight:    104.5 kg    Intake/Output Summary (Last 24 hours) at 05/24/2018 1503 Last data filed at 05/24/2018 0400 Gross per 24 hour  Intake 1050 ml  Output -  Net 1050  ml   Filed Weights   05/24/18 1230  Weight: 104.5 kg    Exam: In bed, NAD Obese female, large neck Tachy but feels mostly regular No increased work of breathing +BS, soft  Data Reviewed:   I have personally reviewed following labs and imaging studies:  Labs: Labs show the following:   Basic Metabolic Panel: Recent Labs  Lab 05/23/18 1623 05/24/18 0309 05/24/18 0437  NA 139  --  139  K 3.5  --  3.3*  CL 103  --  103  CO2 25  --  21*   GLUCOSE 271*  --  235*  BUN 16  --  14  CREATININE 1.32*  --  0.91  CALCIUM 9.3  --  9.1  MG  --  1.3*  --    GFR Estimated Creatinine Clearance: 87.9 mL/min (by C-G formula based on SCr of 0.91 mg/dL). Liver Function Tests: No results for input(s): AST, ALT, ALKPHOS, BILITOT, PROT, ALBUMIN in the last 168 hours. No results for input(s): LIPASE, AMYLASE in the last 168 hours. No results for input(s): AMMONIA in the last 168 hours. Coagulation profile No results for input(s): INR, PROTIME in the last 168 hours.  CBC: Recent Labs  Lab 05/23/18 1623 05/24/18 0437  WBC 22.6* 18.5*  HGB 13.1 12.4  HCT 43.8 39.2  MCV 81.3 80.0  PLT 342 268   Cardiac Enzymes: No results for input(s): CKTOTAL, CKMB, CKMBINDEX, TROPONINI in the last 168 hours. BNP (last 3 results) No results for input(s): PROBNP in the last 8760 hours. CBG: Recent Labs  Lab 05/24/18 0246 05/24/18 0700 05/24/18 1233  GLUCAP 344* 142* 153*   D-Dimer: No results for input(s): DDIMER in the last 72 hours. Hgb A1c: No results for input(s): HGBA1C in the last 72 hours. Lipid Profile: No results for input(s): CHOL, HDL, LDLCALC, TRIG, CHOLHDL, LDLDIRECT in the last 72 hours. Thyroid function studies: No results for input(s): TSH, T4TOTAL, T3FREE, THYROIDAB in the last 72 hours.  Invalid input(s): FREET3 Anemia work up: No results for input(s): VITAMINB12, FOLATE, FERRITIN, TIBC, IRON, RETICCTPCT in the last 72 hours. Sepsis Labs: Recent Labs  Lab 05/23/18 1623 05/24/18 0309 05/24/18 0437  WBC 22.6*  --  18.5*  LATICACIDVEN  --  1.8  --     Microbiology No results found for this or any previous visit (from the past 240 hour(s)).  Procedures and diagnostic studies:  Dg Chest Portable 1 View  Result Date: 05/23/2018 CLINICAL DATA:  Atrial fibrillation. EXAM: PORTABLE CHEST 1 VIEW COMPARISON:  Chest x-ray dated May 12, 2018. FINDINGS: Stable cardiomegaly. Normal pulmonary vascularity. Low lung  volumes. No focal consolidation, pleural effusion, or pneumothorax. No acute osseous abnormality. IMPRESSION: Low volume chest.  No active disease. Electronically Signed   By: Titus Dubin M.D.   On: 05/23/2018 20:44   [place] Lt Upper Extrem Ltd Soft Tissue Non Vascular  Result Date: 05/24/2018 CLINICAL DATA:  Left axillary abscess status post I and D. Evaluate for residual abscess. EXAM: ULTRASOUND LEFT UPPER EXTREMITY LIMITED TECHNIQUE: Ultrasound examination of the left axillary soft tissues was performed in the area of clinical concern. COMPARISON:  None. FINDINGS: Focused ultrasound in the left axilla demonstrates prominent shadowing, echogenic material. There is no discrete fluid collection or soft tissue mass. IMPRESSION: 1. Shadowing artifact in the left axilla may reflect wound packing materials or dystrophic calcification. No discrete residual fluid collection. Electronically Signed   By: Titus Dubin M.D.   On: 05/24/2018 08:54  Medications:   . amLODipine  10 mg Oral Daily  . apixaban  5 mg Oral BID  . furosemide  40 mg Oral BID  . [START ON 05/25/2018] glimepiride  2 mg Oral BH-q7a  . hydroxychloroquine  200 mg Oral BID  . insulin aspart  0-15 Units Subcutaneous TID WC  . insulin aspart  0-5 Units Subcutaneous QHS  . insulin glargine  12 Units Subcutaneous QAC breakfast  . lisinopril  20 mg Oral Daily  . pantoprazole  40 mg Oral Daily  . potassium chloride  10 mEq Oral BID  . rosuvastatin  20 mg Oral q1800  . sodium chloride flush  3 mL Intravenous Q12H  . sotalol  80 mg Oral Q12H   Continuous Infusions: . clindamycin (CLEOCIN) IV 600 mg (05/24/18 1257)     LOS: 0 days   Geradine Girt  [facility]   *Please refer to [email or web address], password [email or web address] to get updated schedule on who will round on this patient, as hospitalists switch teams weekly. If 7PM-7AM, please contact night-coverage at [email or web address], password [email or web address] for any overnight needs.  05/24/2018, 3:03 PM

## 2018-05-24 NOTE — ED Notes (Signed)
Delay in lab draw,  Pt request peri care

## 2018-05-24 NOTE — Plan of Care (Signed)
Pt progressing positively.

## 2018-05-25 LAB — BASIC METABOLIC PANEL
Anion gap: 13 (ref 5–15)
BUN: 8 mg/dL (ref 6–20)
CO2: 20 mmol/L — ABNORMAL LOW (ref 22–32)
Calcium: 9.1 mg/dL (ref 8.9–10.3)
Chloride: 103 mmol/L (ref 98–111)
Creatinine, Ser: 0.88 mg/dL (ref 0.44–1.00)
GFR calc Af Amer: >60 mL/min (ref >60)
GFR calc non Af Amer: >60 mL/min (ref >60)
Glucose, Bld: 144 mg/dL — ABNORMAL HIGH (ref 70–99)
Potassium: 4.3 mmol/L (ref 3.5–5.1)
Sodium: 136 mmol/L (ref 135–145)

## 2018-05-25 LAB — CBC
HCT: 39.9 % (ref 36.0–46.0)
Hemoglobin: 12.2 g/dL (ref 12.0–15.0)
MCH: 25 pg — ABNORMAL LOW (ref 26.0–34.0)
MCHC: 30.6 g/dL (ref 30.0–36.0)
MCV: 81.8 fL (ref 80.0–100.0)
Platelets: 317 10*3/uL (ref 150–400)
RBC: 4.88 MIL/uL (ref 3.87–5.11)
RDW: 15 % (ref 11.5–15.5)
WBC: 8.7 10*3/uL (ref 4.0–10.5)
nRBC: 0 % (ref 0.0–0.2)

## 2018-05-25 LAB — GLUCOSE, CAPILLARY
Glucose-Capillary: 170 mg/dL — ABNORMAL HIGH (ref 70–99)
Glucose-Capillary: 175 mg/dL — ABNORMAL HIGH (ref 70–99)
Glucose-Capillary: 123 mg/dL — ABNORMAL HIGH (ref 70–99)

## 2018-05-25 LAB — MAGNESIUM: Magnesium: 1.5 mg/dL — ABNORMAL LOW (ref 1.7–2.4)

## 2018-05-25 MED ORDER — MAGNESIUM SULFATE 2 GM/50ML IV SOLN
2.0000 g | Freq: Once | INTRAVENOUS | Status: AC
  Administered 2018-05-25: 2 g via INTRAVENOUS
  Filled 2018-05-25: qty 50

## 2018-05-25 MED ORDER — METOPROLOL TARTRATE 12.5 MG HALF TABLET
12.5000 mg | ORAL_TABLET | Freq: Two times a day (BID) | ORAL | Status: DC
  Administered 2018-05-25 (×2): 12.5 mg via ORAL
  Filled 2018-05-25 (×2): qty 1

## 2018-05-25 MED ORDER — CLINDAMYCIN HCL 300 MG PO CAPS
600.0000 mg | ORAL_CAPSULE | Freq: Three times a day (TID) | ORAL | Status: DC
  Administered 2018-05-25: 600 mg via ORAL
  Filled 2018-05-25: qty 2

## 2018-05-25 MED ORDER — INSULIN GLARGINE 100 UNIT/ML ~~LOC~~ SOLN: 16.0000 [IU] | Freq: Every day | SUBCUTANEOUS | 2 refills | Status: DC

## 2018-05-25 MED ORDER — CLINDAMYCIN HCL 300 MG PO CAPS: 300.0000 mg | ORAL_CAPSULE | Freq: Three times a day (TID) | ORAL | 0 refills | Status: DC

## 2018-05-25 MED ORDER — CLINDAMYCIN HCL 300 MG PO CAPS
300.0000 mg | ORAL_CAPSULE | Freq: Three times a day (TID) | ORAL | Status: DC
  Administered 2018-05-25: 300 mg via ORAL
  Filled 2018-05-25: qty 1

## 2018-05-25 MED ORDER — HYDROCODONE-ACETAMINOPHEN 5-325 MG PO TABS: 1.0000 | ORAL_TABLET | Freq: Four times a day (QID) | ORAL | 0 refills | Status: DC | PRN

## 2018-05-25 MED ORDER — METOPROLOL TARTRATE 25 MG PO TABS: 12.5000 mg | ORAL_TABLET | Freq: Two times a day (BID) | ORAL | 0 refills | Status: DC

## 2018-05-25 NOTE — Progress Notes (Signed)
Subjective:  Feels better Pain in left axilla improved No fever  Objective:  Vital Signs in the last 24 hours: Temp:  [97.7 F (36.5 C)-98.5 F (36.9 C)] 98.5 F (36.9 C) (10/12 1804) Pulse Rate:  [84-106] 84 (10/13 0531) Resp:  [14-16] 14 (10/13 0531) BP: (114-134)/(52-97) 114/87 (10/13 0531) SpO2:  [98 %-100 %] 98 % (10/13 0531) Weight:  [101.1 kg-104.5 kg] 101.1 kg (10/13 0531)  Intake/Output from previous day: No intake/output data recorded. Intake/Output from this shift: No intake/output data recorded.  Physical Exam: Nursing noteand vitalsreviewed. Constitutional: She isoriented to person, place, and time. She appearswell-developedand well-nourished.No distress. Morbidly obese HENT:  Head:Normocephalicand atraumatic.  Eyes:Pupils are equal, round, and reactive to light.Conjunctivaeare normal.  Neck:No JVDpresent.  Cardiovascular:Normal rateand regular rhythm. Exam revealsno gallopand no friction rub. Murmur(II/VI crescndo murmur RUSB)heard. Respiratory:Effort normaland breath sounds normal. She hasno wheezes. She hasno rales.  VV:OHYW.Bowel sounds are normal.  Musculoskeletal: She exhibitsedema(trace b/l).  Neurological: She isalertand oriented to person, place, and time. Nocranial nerve deficit.  Skin: Skin iswarmand dry. No significant swelling in left axilla s/p I&D Psychiatric: She has anormal mood and affect.  Lab Results: Recent Labs    05/24/18 0437 05/25/18 0614  WBC 18.5* 8.7  HGB 12.4 12.2  PLT 268 317   Recent Labs    05/24/18 0437 05/25/18 0614  NA 139 136  K 3.3* 4.3  CL 103 103  CO2 21* 20*  GLUCOSE 235* 144*  BUN 14 8  CREATININE 0.91 0.88   Cardiac Studies: Echocardiogram 03/13/2018: Study Conclusions  - Left ventricle: There was moderate concentric hypertrophy. The estimated ejection fraction was in the range of 55% to 60%. Wall motion was normal; there were no regional wall  motion abnormalities. Doppler parameters are consistent with abnormal left ventricular relaxation (grade 1 diastolic dysfunction). - Aortic valve: There was mild stenosis. There was trivial regurgitation. - Left atrium: The atrium was severely dilated. - Pericardium, extracardiac: Moderate circumferential pericardial effusion, most prominent adjacent to right atrium. No significant change compared to previous studies in 12/2017 and 01/2018. Normal right atrial pressure makes constrictive pericarditis unlikely. No evidence of hemodynamic compromise. - No significant change compared to previous studies in 12/2017 and 01/2018.  Cardiac MRI 03/13/2018: 1. Normal LV size and function EF 65% moderate LVH  2. Moderate to Large circumferential pericardial effusion biggest in the posterior and lateral dimensions with no diastolic RV collapse  3. Normal appearing pericardium  4. Mild subepicardial gadolinium uptake in the basal and mid inferior wall and basal antero septum consistent with mild myocarditis  5. Tri leaflet AV with some restricted motion suggest echo correlation for degree of AS  6. Severe LAE  Cath 03/14/2018: LM: Normal LAD: Prox focal 50% stenosis LCx: Normal RCA: Normal  Borderline pulmonary hypertension (mean PA 20-24 mmHg) Invasive hemodynamic findings do not suggest tamponade or constriction physiology.  Recommendation: Aggressive management for hypertension, hyperlipidemia Aggressive medical management for SLE.  Assessment/ Recommendations:  42 year old African American female with long-standing history of lupus, hypertension, type 2 diabetes mellitus, with recurrent pericardial effusion withoiut tamponade physiology (cath 03/2018), nonobstructive CAD, atrial flutter w.RVR, now with left subaxillary abscess s/p I&D  Paroxysmal trial flutter: CHA2DS2VASc score 3: Annual stroke risk 3.2%. S/p cardioversion in the ED on  05/23/2018 Sinus rhythm with frequent PAC's  Continue sotalol 80 mg bid. Do not recommend further increase owing to QTc interval 498 msec Add low dose beta blocker metoprolol 12.5 mg PO bid.  Plan for ablation on 10/29  by Dr. Crissie Sickles.  Left subaxillary abscess: S/p I&D. Management per the primary team  Pericardial effusion: Clinically, not in tamponade. Continue colchicine 0.6 mg bid  CAD: Prox LAD 50% lesion. Continue risk factor modification with blood pressure control and statin Not on aspirin due to risk of bleeding, while on eliquis.  Mild AS: Stable.  Hypertension: Controlled.  Type 2 DM: Managemnent per PCP  Lupus: Follow up with rheumatology. Management per primary team.   LOS: 0 days    [name] 05/25/2018, 12:26 PM  [name] Katherine Hardy, MD [facility] [place] Pager: [phone] Office: [phone] If no answer Cell [phone]

## 2018-05-25 NOTE — Discharge Instructions (Signed)
Monitor blood sugars closely- with treatment blood sugars may improve with treatment of infection and you may not need high dose of insulin

## 2018-05-25 NOTE — Discharge Summary (Addendum)
Physician Discharge Summary  [name] RVI:[id number] DOB: 12-17-1975 DOA: 05/23/2018  PCP: Gildardo Pounds, NP  Admit date: 05/23/2018 Discharge date: 05/25/2018  Admitted From: home Discharge disposition: home   Recommendations for Outpatient Follow-Up:   Follow up with PCP for wound care in 1 week Keep appointment with Dr. Lovena Le for ablation   Discharge Diagnosis:   Principal Problem:   Atrial fibrillation with RVR ([place]) Active Problems:   Lupus ([place])   Diabetes ([place])   Chronic diastolic CHF (congestive heart failure) ([place])   Sepsis ([place])   Cellulitis and abscess of upper extremity    Discharge Condition: Improved.  Diet recommendation: Low sodium, heart healthy.  Carbohydrate-modified  Wound care: None.  Code status: Full.   History of Present Illness:   [name] is a 42 y.o. female with medical history significant of HTN, A. fib on Eliquis diagnosed 04/2018, SLE on Plaquenil; who presents with complaints of being in atrial fibrillation.  She is gone for routine follow-up appointment doctor's office and was noted to be in atrial fibrillation.  Patient also reported complaints of abscess underneath her left axilla that had progressively enlarged and became painful over the last 2 to 3 days.  Denies having any fever, chills, nausea, vomiting, palpitations, chest pain, or significant shortness of breath.  She did complain of associated symptoms of sweats.  Patient had been diagnosed with atrial fibrillation, signs of heart failure, and started on Eliquis at the end of last month after being on the cardiology service.     Hospital Course by Problem:   Paroxysmal atrial flutter: CHA2DS2VASc score 3: Annual stroke risk 3.2%. S/p cardioversion in the ED on 05/23/2018 -eliquis Sinus rhythm with frequent PAC's on tele still Continue sotalol 80 mg bid-- per cards Do not recommend further increase owing to QTc interval 498 msec.  Added low dose  beta blocker metoprolol 12.5 mg PO bid.  Plan for ablation on 10/29 by Dr. Crissie Sickles.  Left subaxillary abscess: S/p I&D -wound care -pain management -abx  Leukocytosis -replete  Pericardial effusion: Continue colchicine  CAD: Prox LAD 50% lesion. Continue risk factor modification with blood pressure control and statin Not on aspirin due to risk of bleeding, while on eliquis.  Mild AS: Stable.  Hypertension: Controlled.  Type 2 DM: Increase lantus dose for better blood sugar control-- as infection clears, may be able to decrease  Hypokalemia/hypomagnesemia -replete  Lupus: Follow up with rheumatology.     Medical Consultants:      Discharge Exam:   Vitals:   05/24/18 2124 05/25/18 0531  BP: (!) 119/52 114/87  Pulse: (!) 106 84  Resp: 15 14  Temp:    SpO2: 100% 98%   Vitals:   05/24/18 1230 05/24/18 1804 05/24/18 2124 05/25/18 0531  BP: (!) 125/95 (!) 134/97 (!) 119/52 114/87  Pulse: 97 (!) 101 (!) 106 84  Resp: _0 Temp: 97.7 F (36.5 C) 98.5 F (36.9 C)    TempSrc: Oral Oral    SpO2: 99% 99% 100% 98%  Weight: 104.5 kg   101.1 kg    General exam: Appears calm and comfortable.     The results of significant diagnostics from this hospitalization (including imaging, microbiology, ancillary and laboratory) are listed below for reference.     Procedures and Diagnostic Studies:   Dg Chest Portable 1 View  Result Date: 05/23/2018 CLINICAL DATA:  Atrial fibrillation. EXAM: PORTABLE CHEST 1 VIEW COMPARISON:  Chest  x-ray dated May 12, 2018. FINDINGS: Stable cardiomegaly. Normal pulmonary vascularity. Low lung volumes. No focal consolidation, pleural effusion, or pneumothorax. No acute osseous abnormality. IMPRESSION: Low volume chest.  No active disease. Electronically Signed   By: Titus Dubin M.D.   On: 05/23/2018 20:44   [place] Lt Upper Extrem Ltd Soft Tissue Non Vascular  Result Date: 05/24/2018 CLINICAL DATA:  Left  axillary abscess status post I and D. Evaluate for residual abscess. EXAM: ULTRASOUND LEFT UPPER EXTREMITY LIMITED TECHNIQUE: Ultrasound examination of the left axillary soft tissues was performed in the area of clinical concern. COMPARISON:  None. FINDINGS: Focused ultrasound in the left axilla demonstrates prominent shadowing, echogenic material. There is no discrete fluid collection or soft tissue mass. IMPRESSION: 1. Shadowing artifact in the left axilla may reflect wound packing materials or dystrophic calcification. No discrete residual fluid collection. Electronically Signed   By: Titus Dubin M.D.   On: 05/24/2018 08:54     Labs:   Basic Metabolic Panel: Recent Labs  Lab 05/23/18 1623 05/24/18 0309 05/24/18 0437 05/25/18 0614  NA 139  --  139 136  K 3.5  --  3.3* 4.3  CL 103  --  103 103  CO2 25  --  21* 20*  GLUCOSE 271*  --  235* 144*  BUN 16  --  14 8  CREATININE 1.32*  --  0.91 0.88  CALCIUM 9.3  --  9.1 9.1  MG  --  1.3*  --  1.5*   GFR Estimated Creatinine Clearance: 89 mL/min (by C-G formula based on SCr of 0.88 mg/dL). Liver Function Tests: No results for input(s): AST, ALT, ALKPHOS, BILITOT, PROT, ALBUMIN in the last 168 hours. No results for input(s): LIPASE, AMYLASE in the last 168 hours. No results for input(s): AMMONIA in the last 168 hours. Coagulation profile No results for input(s): INR, PROTIME in the last 168 hours.  CBC: Recent Labs  Lab 05/23/18 1623 05/24/18 0437 05/25/18 0614  WBC 22.6* 18.5* 8.7  HGB 13.1 12.4 12.2  HCT 43.8 39.2 39.9  MCV 81.3 80.0 81.8  PLT 342 268 317   Cardiac Enzymes: No results for input(s): CKTOTAL, CKMB, CKMBINDEX, TROPONINI in the last 168 hours. BNP: Invalid input(s): POCBNP CBG: Recent Labs  Lab 05/24/18 0246 05/24/18 0700 05/24/18 1233 05/25/18 0821  GLUCAP 344* 142* 153* 170*   D-Dimer No results for input(s): DDIMER in the last 72 hours. Hgb A1c No results for input(s): HGBA1C in the last 72  hours. Lipid Profile No results for input(s): CHOL, HDL, LDLCALC, TRIG, CHOLHDL, LDLDIRECT in the last 72 hours. Thyroid function studies No results for input(s): TSH, T4TOTAL, T3FREE, THYROIDAB in the last 72 hours.  Invalid input(s): FREET3 Anemia work up No results for input(s): VITAMINB12, FOLATE, FERRITIN, TIBC, IRON, RETICCTPCT in the last 72 hours. Microbiology Recent Results (from the past 240 hour(s))  Culture, blood (routine x 2)     Status: None (Preliminary result)   Collection Time: 05/24/18  2:51 AM  Result Value Ref Range Status   Specimen Description BLOOD RIGHT ARM  Final   Special Requests   Final    BOTTLES DRAWN AEROBIC ONLY Blood Culture adequate volume   Culture   Final    NO GROWTH 1 DAY Performed at [facility], [address] [place], [place] [address]    Report Status PENDING  Incomplete  Culture, blood (routine x 2)     Status: None (Preliminary result)   Collection Time: 05/24/18  3:10 AM  Result Value Ref Range Status   Specimen Description BLOOD RIGHT WRIST  Final   Special Requests   Final    BOTTLES DRAWN AEROBIC ONLY Blood Culture results may not be optimal due to an inadequate volume of blood received in culture bottles   Culture   Final    NO GROWTH 1 DAY Performed at [facility] [address] [place], [place] [address]    Report Status PENDING  Incomplete     Discharge Instructions:   Discharge Instructions    Diet - low sodium heart healthy   Complete by:  As directed    Diet Carb Modified   Complete by:  As directed    Increase activity slowly   Complete by:  As directed      Allergies as of 05/25/2018      Reactions   Carrot Oil Shortness Of Breath, Rash, Other (See Comments)   Bumps on tongue, also   Celery Oil Shortness Of Breath, Rash, Other (See Comments)   Bumps on tongue, also   Peanut-containing Drug Products Anaphylaxis, Shortness Of Breath, Itching, Rash, Other (See Comments)   Bumps on tongue,  also   Septra [bactrim] Hives      Medication List    STOP taking these medications   predniSONE 20 MG tablet Commonly known as:  DELTASONE     TAKE these medications   acetaminophen 325 MG tablet Commonly known as:  TYLENOL Take 2 tablets (650 mg total) by mouth every 4 (four) hours as needed for headache or mild pain.   amLODipine 10 MG tablet Commonly known as:  NORVASC Take 1 tablet (10 mg total) by mouth daily.   apixaban 5 MG Tabs tablet Commonly known as:  ELIQUIS Take 1 tablet (5 mg total) by mouth 2 (two) times daily.   clindamycin 300 MG capsule Commonly known as:  CLEOCIN Take 1 capsule (300 mg total) by mouth every 8 (eight) hours.   colchicine 0.6 MG tablet Take 0.6 mg by mouth 2 (two) times daily. What changed:  Another medication with the same name was removed. Continue taking this medication, and follow the directions you see here.   furosemide 40 MG tablet Commonly known as:  LASIX Take 1 tablet (40 mg total) by mouth 2 (two) times daily.   glimepiride 2 MG tablet Commonly known as:  AMARYL Take 1 tablet (2 mg total) by mouth every morning.   glucose blood test strip Use as instructed   HYDROcodone-acetaminophen 5-325 MG tablet Commonly known as:  NORCO/VICODIN Take 1 tablet by mouth every 6 (six) hours as needed for moderate pain.   hydroxychloroquine 200 MG tablet Commonly known as:  PLAQUENIL Take 1 tablet (200 mg total) by mouth 2 (two) times daily.   insulin glargine 100 UNIT/ML injection Commonly known as:  LANTUS Inject 0.16 mLs (16 Units total) into the skin daily. What changed:  how much to take   lisinopril 20 MG tablet Commonly known as:  PRINIVIL,ZESTRIL Take 1 tablet (20 mg total) by mouth daily.   metFORMIN 1000 MG tablet Commonly known as:  GLUCOPHAGE Take 1 tablet (1,000 mg total) by mouth 2 (two) times daily with a meal.   metoprolol tartrate 25 MG tablet Commonly known as:  LOPRESSOR Take 0.5 tablets (12.5 mg total)  by mouth 2 (two) times daily.   omeprazole 20 MG capsule Commonly known as:  PRILOSEC Take 1 capsule (20 mg total) by mouth daily.   potassium  chloride 10 MEQ tablet Commonly known as:  K-DUR Take 1 tablet (10 mEq total) by mouth 2 (two) times daily.   rosuvastatin 20 MG tablet Commonly known as:  CRESTOR Take 1 tablet (20 mg total) by mouth daily at 6 PM.   sotalol 80 MG tablet Commonly known as:  BETAPACE Take 1 tablet (80 mg total) by mouth 2 (two) times daily.   TRUE METRIX METER w/Device Kit Check blood sugars fasting and at bedtime   TRUEPLUS LANCETS 28G Misc Check blood sugars as directed         Time coordinating discharge: 25 min  Signed:  Geradine Girt  [facility] 05/25/2018, 1:36 PM

## 2018-05-25 NOTE — Consult Note (Signed)
[place] Nurse wound consult note Reason for Consult: Provide guidance for after care to left axillae following I&D Wound type: infectious Pressure Injury POA: N/A Measurement: 0.1cm x 1.4cm x depth unable to determine. (Approximately 18cm of packing removed from axillae wound, patient unable to tolerate probe of more than 4 cm Wound PJA:SNKNLZ to see Drainage (amount, consistency, odor) serosanguinous, no purulence Periwound: induration and erythema persist in a 10cm area surrounding stab wound Dressing procedure/placement/frequency: I have provided Nursing with guidance via the orders for twice daily packing of wound with 1/4 inch iodoform gauze packing strip and for every 4 hours application of a warm compress for 20 minutes.  Additionally, they are instructed to premedicate 45 minutes to 1 hour prior to wound care.  [place] nursing team will not follow, but will remain available to this patient, the nursing and medical teams.  Please re-consult if needed. Thanks, Maudie Flakes, MSN, RN, [place], Arther Abbott  Pager# [phone]

## 2018-05-26 LAB — GLUCOSE, CAPILLARY: Glucose-Capillary: 127 mg/dL — ABNORMAL HIGH (ref 70–99)

## 2018-05-26 MED FILL — CLINDAMYCIN HCL 300 MG CAP: 300 | 6 days supply | Qty: 18 | Fill #0

## 2018-05-26 MED FILL — METOPROLOL TARTRATE 25 MG T: 25 | 30 days supply | Qty: 30 | Fill #0

## 2018-05-27 ENCOUNTER — Ambulatory Visit: Payer: Medicaid Other | Admitting: Advanced Practice Midwife

## 2018-05-27 NOTE — Progress Notes (Deleted)
Subjective:     [name] is a 42 y.o. female here for a routine exam.  Current complaints: ***.  Personal health questionnaire reviewed: {yes/no:9010}.   Gynecologic History No LMP recorded. Patient has had an injection. Contraception: {method:5051} Last Pap: ***. Results were: {norm/abn:16337} Last mammogram: ***. Results were: {norm/abn:16337}  Obstetric History OB History  Gravida Para Term Preterm AB Living  5 2 0 2 3 2   SAB TAB Ectopic Multiple Live Births  2 1 0 0 2    # Outcome Date GA Lbr Len/2nd Weight Sex Delivery Anes PTL Lv  5 SAB           4 SAB  [redacted]w[redacted]d   M         Birth Comments: iufd  3 TAB           2 Preterm  [redacted]w[redacted]d 17:00     Y LIV  1 Preterm  [redacted]w[redacted]d 06:00 2807 g M Vag-Spont None Y LIV     {Common ambulatory SmartLinks:19316}  Review of Systems {ros; complete:30496}    Objective:    {exam; complete:18323}    Assessment:    Healthy female exam.    Plan:    {plan:19193}

## 2018-05-29 LAB — CULTURE, BLOOD (ROUTINE X 2)
Culture: NO GROWTH
Culture: NO GROWTH
Special Requests: ADEQUATE

## 2018-06-03 ENCOUNTER — Inpatient Hospital Stay: Payer: Medicaid Other | Admitting: Nurse Practitioner

## 2018-06-05 ENCOUNTER — Telehealth: Payer: Self-pay

## 2018-06-05 NOTE — Telephone Encounter (Signed)
VM left for Pt requesting call back to confirm instructions for ablation.

## 2018-06-09 NOTE — Anesthesia Preprocedure Evaluation (Addendum)
Anesthesia Evaluation    Reviewed: Allergy & Precautions, Patient's Chart, lab work & pertinent test results, reviewed documented beta blocker date and time   Airway Mallampati: II  TM Distance: >3 FB Neck ROM: Full    Dental  (+) Missing, Chipped, Dental Advisory Given,    Pulmonary neg pulmonary ROS,    Pulmonary exam normal breath sounds clear to auscultation       Cardiovascular hypertension, Pt. on medications and Pt. on home beta blockers + dysrhythmias Atrial Fibrillation  Rhythm:Regular Rate:Tachycardia  ECG: rate 110.  Sinus tachycardia Right axis deviation  CATH LM: Normal LAD: Prox focal 50% stenosis LCx: Normal RCA: Normal Borderline pulmonary hypertension (mean PA 20-24 mmHg)  ECHO LV EF: 55% -   60%   Neuro/Psych PSYCHIATRIC DISORDERS Depression negative neurological ROS     GI/Hepatic Neg liver ROS, GERD  Medicated and Controlled,  Endo/Other  diabetes, Oral Hypoglycemic Agents, Insulin DependentMorbid obesityLupus  Renal/GU negative Renal ROS     Musculoskeletal negative musculoskeletal ROS (+)   Abdominal (+) + obese,   Peds  Hematology HLD   Anesthesia Other Findings A-flutter with RVR, rate 154 in holding  Reproductive/Obstetrics hcg negative                          Anesthesia Physical Anesthesia Plan  ASA: IV  Anesthesia Plan: General   Post-op Pain Management:    Induction: Intravenous  PONV Risk Score and Plan: 3 and Midazolam, Dexamethasone, Ondansetron and Treatment may vary due to age or medical condition  Airway Management Planned: Oral ETT  Additional Equipment:   Intra-op Plan:   Post-operative Plan: Extubation in OR  Informed Consent: I have reviewed the patients History and Physical, chart, labs and discussed the procedure including the risks, benefits and alternatives for the proposed anesthesia with the patient or authorized representative  who has indicated his/her understanding and acceptance.   Dental advisory given  Plan Discussed with: CRNA  Anesthesia Plan Comments:        Anesthesia Quick Evaluation

## 2018-06-10 ENCOUNTER — Encounter (HOSPITAL_COMMUNITY)
Admission: RE | Disposition: A | Discharge status: Home / Self Care | Payer: Self-pay | Source: Ambulatory Visit | Attending: Internal Medicine

## 2018-06-10 ENCOUNTER — Other Ambulatory Visit: Payer: Self-pay

## 2018-06-10 ENCOUNTER — Ambulatory Visit (HOSPITAL_COMMUNITY): Payer: Self-pay | Admitting: Anesthesiology

## 2018-06-10 ENCOUNTER — Ambulatory Visit (HOSPITAL_COMMUNITY)
Admission: RE | Admit: 2018-06-10 | Discharge: 2018-06-10 | Disposition: A | Discharge status: Home / Self Care | Payer: Self-pay | Source: Ambulatory Visit | Attending: Internal Medicine | Admitting: Internal Medicine

## 2018-06-10 DIAGNOSIS — Z833 Family history of diabetes mellitus: Secondary | ICD-10-CM | POA: Insufficient documentation

## 2018-06-10 DIAGNOSIS — Z881 Allergy status to other antibiotic agents status: Secondary | ICD-10-CM | POA: Insufficient documentation

## 2018-06-10 DIAGNOSIS — F329 Major depressive disorder, single episode, unspecified: Secondary | ICD-10-CM | POA: Insufficient documentation

## 2018-06-10 DIAGNOSIS — M329 Systemic lupus erythematosus, unspecified: Secondary | ICD-10-CM | POA: Insufficient documentation

## 2018-06-10 DIAGNOSIS — E119 Type 2 diabetes mellitus without complications: Secondary | ICD-10-CM | POA: Insufficient documentation

## 2018-06-10 DIAGNOSIS — I483 Typical atrial flutter: Secondary | ICD-10-CM | POA: Insufficient documentation

## 2018-06-10 DIAGNOSIS — I509 Heart failure, unspecified: Secondary | ICD-10-CM | POA: Insufficient documentation

## 2018-06-10 DIAGNOSIS — I4892 Unspecified atrial flutter: Secondary | ICD-10-CM | POA: Diagnosis present

## 2018-06-10 DIAGNOSIS — Z955 Presence of coronary angioplasty implant and graft: Secondary | ICD-10-CM | POA: Insufficient documentation

## 2018-06-10 DIAGNOSIS — Z8632 Personal history of gestational diabetes: Secondary | ICD-10-CM | POA: Insufficient documentation

## 2018-06-10 DIAGNOSIS — Z794 Long term (current) use of insulin: Secondary | ICD-10-CM | POA: Insufficient documentation

## 2018-06-10 DIAGNOSIS — Z7901 Long term (current) use of anticoagulants: Secondary | ICD-10-CM | POA: Insufficient documentation

## 2018-06-10 DIAGNOSIS — Z79899 Other long term (current) drug therapy: Secondary | ICD-10-CM | POA: Insufficient documentation

## 2018-06-10 DIAGNOSIS — I110 Hypertensive heart disease with heart failure: Secondary | ICD-10-CM | POA: Insufficient documentation

## 2018-06-10 DIAGNOSIS — Z6841 Body Mass Index (BMI) 40.0 and over, adult: Secondary | ICD-10-CM | POA: Insufficient documentation

## 2018-06-10 DIAGNOSIS — Z8249 Family history of ischemic heart disease and other diseases of the circulatory system: Secondary | ICD-10-CM | POA: Insufficient documentation

## 2018-06-10 DIAGNOSIS — I313 Pericardial effusion (noninflammatory): Secondary | ICD-10-CM | POA: Insufficient documentation

## 2018-06-10 DIAGNOSIS — Z8619 Personal history of other infectious and parasitic diseases: Secondary | ICD-10-CM | POA: Insufficient documentation

## 2018-06-10 HISTORY — PX: A-FLUTTER ABLATION: EP1230

## 2018-06-10 LAB — PREGNANCY, URINE: Preg Test, Ur: NEGATIVE

## 2018-06-10 LAB — GLUCOSE, CAPILLARY
Glucose-Capillary: 128 mg/dL — ABNORMAL HIGH (ref 70–99)
Glucose-Capillary: 117 mg/dL — ABNORMAL HIGH (ref 70–99)

## 2018-06-10 SURGERY — A-FLUTTER ABLATION
Anesthesia: General

## 2018-06-10 MED ORDER — LIDOCAINE HCL (CARDIAC) PF 100 MG/5ML IV SOSY
PREFILLED_SYRINGE | INTRAVENOUS | Status: DC | PRN
  Administered 2018-06-10: 100 mg via INTRAVENOUS

## 2018-06-10 MED ORDER — DEXAMETHASONE SODIUM PHOSPHATE 10 MG/ML IJ SOLN
INTRAMUSCULAR | Status: DC | PRN
  Administered 2018-06-10: 8 mg via INTRAVENOUS

## 2018-06-10 MED ORDER — OXYCODONE HCL 5 MG/5ML PO SOLN: 5.0000 mg | Freq: Once | ORAL | Status: DC | PRN

## 2018-06-10 MED ORDER — ONDANSETRON HCL 4 MG/2ML IJ SOLN: 4.0000 mg | Freq: Four times a day (QID) | INTRAMUSCULAR | Status: DC | PRN

## 2018-06-10 MED ORDER — ROCURONIUM BROMIDE 100 MG/10ML IV SOLN
INTRAVENOUS | Status: DC | PRN
  Administered 2018-06-10: 50 mg via INTRAVENOUS
  Administered 2018-06-10: 20 mg via INTRAVENOUS

## 2018-06-10 MED ORDER — HEPARIN SODIUM (PORCINE) 1000 UNIT/ML IJ SOLN
INTRAMUSCULAR | Status: AC
  Filled 2018-06-10: qty 1

## 2018-06-10 MED ORDER — ACETAMINOPHEN 325 MG PO TABS: 650.0000 mg | ORAL_TABLET | ORAL | Status: DC | PRN

## 2018-06-10 MED ORDER — METOPROLOL TARTRATE 5 MG/5ML IV SOLN
INTRAVENOUS | Status: AC
  Administered 2018-06-10: 5 mg via INTRAVENOUS
  Filled 2018-06-10: qty 5

## 2018-06-10 MED ORDER — ONDANSETRON HCL 4 MG/2ML IJ SOLN
INTRAMUSCULAR | Status: DC | PRN
  Administered 2018-06-10: 4 mg via INTRAVENOUS

## 2018-06-10 MED ORDER — PROPOFOL 10 MG/ML IV BOLUS
INTRAVENOUS | Status: DC | PRN
  Administered 2018-06-10: 20 mg via INTRAVENOUS
  Administered 2018-06-10: 150 mg via INTRAVENOUS

## 2018-06-10 MED ORDER — MIDAZOLAM HCL 5 MG/5ML IJ SOLN
INTRAMUSCULAR | Status: DC | PRN
  Administered 2018-06-10: 2 mg via INTRAVENOUS

## 2018-06-10 MED ORDER — METOPROLOL TARTRATE 5 MG/5ML IV SOLN
5.0000 mg | Freq: Once | INTRAVENOUS | Status: AC
  Administered 2018-06-10: 5 mg via INTRAVENOUS

## 2018-06-10 MED ORDER — HYDROMORPHONE HCL 1 MG/ML IJ SOLN
0.2500 mg | INTRAMUSCULAR | Status: DC | PRN
  Administered 2018-06-10: 0.5 mg via INTRAVENOUS

## 2018-06-10 MED ORDER — SOTALOL HCL 80 MG PO TABS: 80.0000 mg | ORAL_TABLET | Freq: Two times a day (BID) | ORAL | Status: DC

## 2018-06-10 MED ORDER — BUPIVACAINE HCL (PF) 0.25 % IJ SOLN
INTRAMUSCULAR | Status: DC | PRN
  Administered 2018-06-10: 30 mL

## 2018-06-10 MED ORDER — HYDROMORPHONE HCL 1 MG/ML IJ SOLN
INTRAMUSCULAR | Status: AC
  Administered 2018-06-10: 0.5 mg via INTRAVENOUS
  Filled 2018-06-10: qty 1

## 2018-06-10 MED ORDER — SUGAMMADEX SODIUM 200 MG/2ML IV SOLN
INTRAVENOUS | Status: DC | PRN
  Administered 2018-06-10: 204.2 mg via INTRAVENOUS

## 2018-06-10 MED ORDER — SODIUM CHLORIDE 0.9 % IV SOLN
INTRAVENOUS | Status: DC | PRN
  Administered 2018-06-10: 25 ug/min via INTRAVENOUS

## 2018-06-10 MED ORDER — FENTANYL CITRATE (PF) 100 MCG/2ML IJ SOLN
INTRAMUSCULAR | Status: DC | PRN
  Administered 2018-06-10: 50 ug via INTRAVENOUS
  Administered 2018-06-10: 25 ug via INTRAVENOUS
  Administered 2018-06-10: 100 ug via INTRAVENOUS

## 2018-06-10 MED ORDER — ESMOLOL HCL 100 MG/10ML IV SOLN
INTRAVENOUS | Status: DC | PRN
  Administered 2018-06-10 (×3): 10 mg via INTRAVENOUS

## 2018-06-10 MED ORDER — SODIUM CHLORIDE 0.9 % IV SOLN: 250.0000 mL | INTRAVENOUS | Status: DC | PRN

## 2018-06-10 MED ORDER — HEPARIN SODIUM (PORCINE) 1000 UNIT/ML IJ SOLN
INTRAMUSCULAR | Status: DC | PRN
  Administered 2018-06-10: 1000 [IU] via INTRAVENOUS

## 2018-06-10 MED ORDER — HEPARIN (PORCINE) IN NACL 1000-0.9 UT/500ML-% IV SOLN
INTRAVENOUS | Status: DC | PRN
  Administered 2018-06-10: 500 mL

## 2018-06-10 MED ORDER — PROMETHAZINE HCL 25 MG/ML IJ SOLN: 6.2500 mg | INTRAMUSCULAR | Status: DC | PRN

## 2018-06-10 MED ORDER — SODIUM CHLORIDE 0.9 % IV SOLN
INTRAVENOUS | Status: DC
  Administered 2018-06-10 (×2): via INTRAVENOUS

## 2018-06-10 MED ORDER — SODIUM CHLORIDE 0.9% FLUSH: 3.0000 mL | INTRAVENOUS | Status: DC | PRN

## 2018-06-10 MED ORDER — OXYCODONE HCL 5 MG PO TABS: 5.0000 mg | ORAL_TABLET | Freq: Once | ORAL | Status: DC | PRN

## 2018-06-10 MED ORDER — SODIUM CHLORIDE 0.9% FLUSH: 3.0000 mL | Freq: Two times a day (BID) | INTRAVENOUS | Status: DC

## 2018-06-10 MED ORDER — SOTALOL HCL 80 MG PO TABS
80.0000 mg | ORAL_TABLET | Freq: Once | ORAL | Status: AC
  Administered 2018-06-10: 80 mg via ORAL
  Filled 2018-06-10: qty 1

## 2018-06-10 SURGICAL SUPPLY — 12 items
BLANKET WARM UNDERBOD FULL ACC (MISCELLANEOUS) ×3 IMPLANT
CATH JOSEPH QUAD ALLRED 6F REP (CATHETERS) ×3 IMPLANT
CATH SMTCH THERMOCOOL SF FJ (CATHETERS) ×3 IMPLANT
CATH WEBSTER BI DIR CS D-F CRV (CATHETERS) ×3 IMPLANT
PACK EP LATEX FREE (CUSTOM PROCEDURE TRAY) ×2
PACK EP LF (CUSTOM PROCEDURE TRAY) ×1 IMPLANT
PAD PRO RADIOLUCENT 2001M-C (PAD) ×3 IMPLANT
PATCH CARTO3 (PAD) ×3 IMPLANT
SHEATH PINNACLE 6F 10CM (SHEATH) ×3 IMPLANT
SHEATH PINNACLE 7F 10CM (SHEATH) ×3 IMPLANT
SHEATH PINNACLE 8F 10CM (SHEATH) ×6 IMPLANT
TUBING SMART ABLATE COOLFLOW (TUBING) ×3 IMPLANT

## 2018-06-10 NOTE — H&P (Signed)
Cardiology Consultation:  Patient ID: [name]  MRN: [id number]; DOB: June 27, 1976  Admit date: 05/12/2018  Date of Consult: 05/12/2018  Primary Care Provider: Gildardo Pounds, NP  Primary Cardiologist: Dr. Virgina Jock  Primary Electrophysiologist: None  Patient Profile:  [name] is a 42 y.o. female with a hx of HTN, DM, morbid obesity, lupus (SLE on plaquenil), with recurrent moderate pericardial effusion, and more recently new AFlutter, who is being seen today for the evaluation of AFlutter at the request of Dr. Einar Gip.  History of Present Illness:  [name] was found to have new Aflutter 04/30/18, Dr. Einar Gip notes, she was started on Eliquis after long discussion regarding risks and benefits especially in view of pericardial effusion, beta-blocker was continued as her rate was controlled. She returns to [facility] ER with c/o increasing SOB, initially thought to be in SVT given adenosine, noted to be in rapid Aflutter. Cardiology was called, she was started on diltiazem gtt for rate control given new to a/c.  LABS  K+ 3.5  BUN/Creat 13/0.98  BNP 302  poc Trop 0.09  WBC 7.3  H/H 12/40  Plts 273  She has no rest SOB, no CP, some palpitations. BP is stable, rates 110's on dilt gtt. I do not see Eliquis on her home meds list though she confirms she was taking every day via samples from Dr. Virgina Jock last week, none so far today.      Past Medical History:  Diagnosis Date  . Abnormal Pap smear of cervix    colpo, HPV  . Allergy   . Anemia   . CHF (congestive heart failure) ([place])   . Depression    after losses  . Enlarged heart    managed by cardiology  . Fibroid   . Gestational diabetes   . Heart murmur   . Hypertension   . Infection    UTI  . Lupus ([place]) 1999        Past Surgical History:  Procedure Laterality Date  . RIGHT/LEFT HEART CATH AND CORONARY ANGIOGRAPHY N/A 03/14/2018   Procedure: RIGHT/LEFT HEART CATH AND CORONARY ANGIOGRAPHY; Surgeon: [name], MD; Location: [facility]; Service: Cardiovascular; Laterality: N/A;  . THERAPEUTIC ABORTION     Home Medications:         Prior to Admission medications   Medication Sig Start Date End Date Taking? Authorizing Provider  acetaminophen (TYLENOL) 325 MG tablet Take 2 tablets (650 mg total) by mouth every 4 (four) hours as needed for headache or mild pain. 12/28/17  Yes Georgette Shell, MD  amLODipine (NORVASC) 10 MG tablet Take 1 tablet (10 mg total) by mouth daily. 03/31/12  Yes West, Emily, PA-C  colchicine 0.6 MG tablet Take 1 tablet (0.6 mg total) by mouth 2 (two) times daily for 14 days. 03/15/18 05/12/18 Yes Danford, Suann Larry, MD  furosemide (LASIX) 40 MG tablet Take 1 tablet (40 mg total) by mouth 2 (two) times daily. 01/18/18  Yes Rai, Ripudeep K, MD  glimepiride (AMARYL) 2 MG tablet Take 1 tablet (2 mg total) by mouth every morning. 02/25/18 02/25/19 Yes Gildardo Pounds, NP  hydroxychloroquine (PLAQUENIL) 200 MG tablet Take 1 tablet (200 mg total) by mouth 2 (two) times daily. 01/18/18  Yes Rai, Ripudeep K, MD  insulin glargine (LANTUS) 100 UNIT/ML injection Inject 0.12 mLs (12 Units total) into the skin daily. 03/15/18  Yes Danford, Suann Larry, MD  lisinopril (PRINIVIL,ZESTRIL) 20 MG tablet Take 1 tablet (20 mg total)  by mouth daily. 04/10/18  Yes Gildardo Pounds, NP  metFORMIN (GLUCOPHAGE) 1000 MG tablet Take 1 tablet (1,000 mg total) by mouth 2 (two) times daily with a meal. 01/18/18  Yes Rai, Ripudeep K, MD  metoprolol tartrate (LOPRESSOR) 50 MG tablet Take 1 tablet (50 mg total) by mouth 2 (two) times daily. 02/25/18 05/12/18 Yes Gildardo Pounds, NP  omeprazole (PRILOSEC) 20 MG capsule Take 1 capsule (20 mg total) by mouth daily. 02/25/18 06/25/18 Yes Gildardo Pounds, NP  predniSONE (DELTASONE) 20 MG tablet Take 2 tablets (40 mg total) by mouth daily with breakfast. 04/10/18  Yes Gildardo Pounds, NP  rosuvastatin (CRESTOR) 20 MG tablet Take 1 tablet (20 mg total) by  mouth daily at 6 PM. 04/10/18  Yes Gildardo Pounds, NP  Blood Glucose Monitoring Suppl (TRUE METRIX METER) w/Device KIT Check blood sugars fasting and at bedtime 01/30/18   Katherine Donovan, PA-C  glucose blood (TRUE METRIX BLOOD GLUCOSE TEST) test strip Use as instructed 01/30/18   Katherine Donovan, PA-C  TRUEPLUS LANCETS 28G MISC Check blood sugars as directed 01/30/18   Katherine Donovan, PA-C  Inpatient Medications:  Scheduled Meds:  . hydrocortisone sod succinate (SOLU-CORTEF) inj 100 mg Intravenous Once   Continuous Infusions:  . diltiazem (CARDIZEM) infusion 10 mg/hr (05/12/18 1043)   PRN Meds:  Allergies:      Allergies  Allergen Reactions  . Septra [Bactrim] Hives   Social History:  Social History        Socioeconomic History  . Marital status: Married    Spouse name: Not on file  . Number of children: Not on file  . Years of education: Not on file  . Highest education level: Not on file  Occupational History  . Not on file  Social Needs  . Financial resource strain: Not on file  . Food insecurity:    Worry: Patient refused    Inability: Patient refused  . Transportation needs:    Medical: Patient refused    Non-medical: Patient refused  Tobacco Use  . Smoking status: Never Smoker  . Smokeless tobacco: Never Used  Substance and Sexual Activity  . Alcohol use: Yes    Comment: ocasionally   . Drug use: No  . Sexual activity: Yes    Birth control/protection: Injection  Lifestyle  . Physical activity:    Days per week: Patient refused    Minutes per session: Patient refused  . Stress: Not on file  Relationships  . Social connections:    Talks on phone: Patient refused    Gets together: Patient refused    Attends religious service: Patient refused    Active member of club or organization: Patient refused    Attends meetings of clubs or organizations: Patient refused    Relationship status: Patient refused  . Intimate partner violence:    Fear of current  or ex partner: Patient refused    Emotionally abused: Patient refused    Physically abused: Patient refused    Forced sexual activity: Patient refused  Other Topics Concern  . Not on file  Social History Narrative  . Not on file   Family History:       Family History  Problem Relation Age of Onset  . Cancer Maternal Grandfather    lung  . Diabetes Mother   . Hypertension Maternal Grandmother   . Diabetes Father   . Hearing loss Neg Hx    ROS:  Please see the history of  present illness.  All other ROS reviewed and negative.  Physical Exam/Data:         Vitals:   05/12/18 1017 05/12/18 1022 05/12/18 1030 05/12/18 1045  BP: 108/80  105/64 107/78  Pulse: (!) 171  (!) 39   Resp: _0 Temp:      TempSrc:      SpO2:  95% (!) 72%   Weight:      Height:       No intake or output data in the 24 hours ending 05/12/18 1503     Filed Weights   05/12/18 0951  Weight: 101 kg   Body mass index is 43.49 kg/m.  General: Well nourished, well developed, in no acute distress  HEENT: normal  Lymph: no adenopathy  Neck: no obvious JVD  Endocrine: No thryomegaly  Vascular: No carotid bruits  Cardiac: irreg-irreg, tachycardic; not distant, no murmurs, gallops appreciate, I do not hear a rub  Lungs: CTA b/l, no wheezing, rhonchi or rales  Abd: soft, nontender, obese  Ext: no edema  Musculoskeletal: No deformities  Skin: warm and dry  Neuro: No gross focal abnormalities noted  Psych: Normal affect  EKG: The EKG was personally reviewed and demonstrates:  AFlutter 171bpm  AFlutter 169bpm  AFlutter 166bpm  Telemetry: Telemetry was personally reviewed and demonstrates:  AFlutter 110-120 currently  Relevant CV Studies:  03/14/18; LHC  LM: Normal  LAD: Prox focal 50% stenosis LCx: Normal  RCA: Normal  Borderline pulmonary hypertension (mean PA 20-24 mmHg)  Invasive hemodynamic findings do not suggest tamponade or constriction physiology.  03/13/18: TTE  Study Conclusions    - Left ventricle: There was moderate concentric hypertrophy. The  estimated ejection fraction was in the range of 55% to 60%. Wall  motion was normal; there were no regional wall motion  abnormalities. Doppler parameters are consistent with abnormal  left ventricular relaxation (grade 1 diastolic dysfunction).  - Aortic valve: There was mild stenosis. There was trivial  regurgitation.  - Left atrium: The atrium was severely dilated.  - Pericardium, extracardiac: Moderate circumferential pericardial  effusion, most prominent adjacent to right atrium. No significant  change compared to previous studies in 12/2017 and 01/2018.  Normal right atrial pressure makes constrictive pericarditis  unlikely. No evidence of hemodynamic compromise.  - No significant change compared to previous studies in 12/2017 and  01/2018.  Laboratory Data:  [place]      Hematology  Last Labs                                                   Cardiac Enzymes  Last Labs      Last Labs                   BNP  Last Labs                   DDimer  Last Labs      Radiology/Studies:  Dg Chest  Port 1 View  Result Date: 05/12/2018  CLINICAL DATA: Shortness of Breath EXAM: PORTABLE CHEST 1 VIEW COMPARISON: March 12, 2018 FINDINGS: There is no edema or consolidation. There is cardiomegaly with pulmonary vascularity normal. No adenopathy. No bone lesions. No pneumothorax. IMPRESSION: Cardiomegaly, stable. No edema or consolidation. Electronically Signed By: Lowella Grip III M.D. On: 05/12/2018 10:14  Assessment and Plan:  1. Aflutter, looks typical  CHA2DS2Vasc is 3, she was given samples of Eliquis last week at Dr. Bonney Roussel office  Agree with diltiazem gtt/rate control  Consider TEE/DCCV  Dr. Lovena Le will see, and review, her AFlutter looks typical, perhaps an ablation out patient vs AAD   2. SLE  On plequenil  3. Chronic/recurrent pericardial effusion  BP stable  For questions or updates, please contact [place]  Please consult [email or web address] for contact info under  Signed,  Baldwin Jamaica, PA-C  05/12/2018 3:03 PM  EP Attending  Patient seen and examined. Agree with above. The patient is a morbidly obese 42 yo woman with multiple medical problems including SLE and a pericardial effusion. She is morbidly obese and has HTN and DM. She presented with symptomatic atrial flutter. I cannot find any ECG with her in atrial fibrillation. She has fairly rapid atrial flutter. Her VR has been difficult to control. It is unclear to me exactly how long she has been on systemic anti-coagulation but I do not think it has been 3 weeks. Her morbid obesity makes catheter ablation as well as amiodarone less than ideal. I will discuss the history with Dr. Einar Gip. Her morbid obesity makes ablation with general anesthesia a requirement. I think TEE/DCCV in the short term along with some amidarone makes the most sensed in the short term. Her ventricular rates would be very difficult to control with oral drugs alone.  Ponciano Ort.  EP Attending  Patient seen and examined. Agree with above. Since my last evaluation, no change. We will proceed with EP study and ablation.   Mikle Bosworth.D.

## 2018-06-10 NOTE — Anesthesia Procedure Notes (Signed)
Procedure Name: Intubation Date/Time: 06/10/2018 7:52 AM Performed by: Lavell Luster, CRNA Pre-anesthesia Checklist: Patient identified, Emergency Drugs available, Suction available, Patient being monitored and Timeout performed Patient Re-evaluated:Patient Re-evaluated prior to induction Oxygen Delivery Method: Circle system utilized Preoxygenation: Pre-oxygenation with 100% oxygen Induction Type: IV induction Ventilation: Oral airway inserted - appropriate to patient size and Mask ventilation without difficulty Laryngoscope Size: Mac and 3 Grade View: Grade I Tube type: Oral Tube size: 7.0 mm Number of attempts: 1 Airway Equipment and Method: Stylet Placement Confirmation: ETT inserted through vocal cords under direct vision,  positive ETCO2 and breath sounds checked- equal and bilateral Secured at: 21 cm Tube secured with: Tape Dental Injury: Teeth and Oropharynx as per pre-operative assessment  Difficulty Due To: Difficulty was anticipated Comments: DL x 1 with MAC 3 blade, difficult two handed mask with oral airway, Ellender assist.  Videolaryngoscope available but not used.  Ellender verified ETT placement.    Henderson Cloud, CRNA

## 2018-06-10 NOTE — Transfer of Care (Signed)
Immediate Anesthesia Transfer of Care Note  Patient: [name]  Procedure(s) Performed: A-FLUTTER ABLATION (N/A )  Patient Location: Cath Lab  Anesthesia Type:General  Level of Consciousness: awake, alert  and oriented  Airway & Oxygen Therapy: Patient connected to face mask oxygen  Post-op Assessment: Post -op Vital signs reviewed and stable  Post vital signs: stable  Last Vitals:  Vitals Value Taken Time  BP 145/100 06/10/2018 10:19 AM  Temp 36.5 C 06/10/2018 10:17 AM  Pulse    Resp 19 06/10/2018 10:19 AM  SpO2 95 % 06/10/2018 10:10 AM  Vitals shown include unvalidated device data.  Last Pain:  Vitals:   06/10/18 1017  TempSrc: Temporal  PainSc:          Complications: No apparent anesthesia complications

## 2018-06-10 NOTE — Discharge Instructions (Signed)
Femoral Site Care °Refer to this sheet in the next few weeks. These instructions provide you with information about caring for yourself after your procedure. Your health care provider may also give you more specific instructions. Your treatment has been planned according to current medical practices, but problems sometimes occur. Call your health care provider if you have any problems or questions after your procedure. °What can I expect after the procedure? °After your procedure, it is typical to have the following: °· Bruising at the site that usually fades within 1-2 weeks. °· Blood collecting in the tissue (hematoma) that may be painful to the touch. It should usually decrease in size and tenderness within 1-2 weeks. ° °Follow these instructions at home: °· Take medicines only as directed by your health care provider. °· You may shower 24-48 hours after the procedure or as directed by your health care provider. Remove the bandage (dressing) and gently wash the site with plain soap and water. Pat the area dry with a clean towel. Do not rub the site, because this may cause bleeding. °· Do not take baths, swim, or use a hot tub until your health care provider approves. °· Check your insertion site every day for redness, swelling, or drainage. °· Do not apply powder or lotion to the site. °· Limit use of stairs to twice a day for the first 2-3 days or as directed by your health care provider. °· Do not squat for the first 2-3 days or as directed by your health care provider. °· Do not lift over 10 lb (4.5 kg) for 5 days after your procedure or as directed by your health care provider. °· Ask your health care provider when it is okay to: °? Return to work or school. °? Resume usual physical activities or sports. °? Resume sexual activity. °· Do not drive home if you are discharged the same day as the procedure. Have someone else drive you. °· You may drive 24 hours after the procedure unless otherwise instructed by  your health care provider. °· Do not operate machinery or power tools for 24 hours after the procedure or as directed by your health care provider. °· If your procedure was done as an outpatient procedure, which means that you went home the same day as your procedure, a responsible adult should be with you for the first 24 hours after you arrive home. °· Keep all follow-up visits as directed by your health care provider. This is important. °Contact a health care provider if: °· You have a fever. °· You have chills. °· You have increased bleeding from the site. Hold pressure on the site. °Get help right away if: °· You have unusual pain at the site. °· You have redness, warmth, or swelling at the site. °· You have drainage (other than a small amount of blood on the dressing) from the site. °· The site is bleeding, and the bleeding does not stop after 30 minutes of holding steady pressure on the site. °· Your leg or foot becomes pale, cool, tingly, or numb. °This information is not intended to replace advice given to you by your health care provider. Make sure you discuss any questions you have with your health care provider. °Document Released: 04/02/2014 Document Revised: 01/05/2016 Document Reviewed: 02/16/2014 °Elsevier Interactive Patient Education © 2018 [employer] °Moderate Conscious Sedation, Adult, Care After °These instructions provide you with information about caring for yourself after your procedure. Your health care provider may also give you more   specific instructions. Your treatment has been planned according to current medical practices, but problems sometimes occur. Call your health care provider if you have any problems or questions after your procedure. What can I expect after the procedure? After your procedure, it is common:  To feel sleepy for several hours.  To feel clumsy and have poor balance for several hours.  To have poor judgment for several hours.  To vomit if you eat too  soon.  Follow these instructions at home: For at least 24 hours after the procedure:   Do not: ? Participate in activities where you could fall or become injured. ? Drive. ? Use heavy machinery. ? Drink alcohol. ? Take sleeping pills or medicines that cause drowsiness. ? Make important decisions or sign legal documents. ? Take care of children on your own.  Rest. Eating and drinking  Follow the diet recommended by your health care provider.  If you vomit: ? Drink water, juice, or soup when you can drink without vomiting. ? Make sure you have little or no nausea before eating solid foods. General instructions  Have a responsible adult stay with you until you are awake and alert.  Take over-the-counter and prescription medicines only as told by your health care provider.  If you smoke, do not smoke without supervision.  Keep all follow-up visits as told by your health care provider. This is important. Contact a health care provider if:  You keep feeling nauseous or you keep vomiting.  You feel light-headed.  You develop a rash.  You have a fever. Get help right away if:  You have trouble breathing. This information is not intended to replace advice given to you by your health care provider. Make sure you discuss any questions you have with your health care provider. Document Released: 05/20/2013 Document Revised: 01/02/2016 Document Reviewed: 11/19/2015 Elsevier Interactive Patient Education  2018 [place] procedure care instructions No driving for 4 days. No lifting over 5 lbs for 1 week. No vigorous or sexual activity for 1 week. You may return to work on 06/17/18. Keep procedure site clean & dry. If you notice increased pain, swelling, bleeding or pus, call/return!  You may shower, but no soaking baths/hot tubs/pools for 1 week.

## 2018-06-10 NOTE — Progress Notes (Signed)
Dr Lovena Le notified of B/P and per Dr Lovena Le give client sotalol and d/c home

## 2018-06-10 NOTE — Progress Notes (Signed)
Site area: rt groin 3 fv sheaths Site Prior to Removal:  Level 0 Pressure Applied For: 20 minutes Manual:   yes Patient Status During Pull:  stable Post Pull Site:  Level  0 Post Pull Instructions Given:  yes Post Pull Pulses Present:  Rt dp palpable Dressing Applied:  Gauze and tegaderm Bedrest begins @ 8166 Comments:  IV saline locked

## 2018-06-10 NOTE — Progress Notes (Signed)
Dr Lovena Le in and order noted

## 2018-06-10 NOTE — Anesthesia Postprocedure Evaluation (Signed)
Anesthesia Post Note  Patient: [name]  Procedure(s) Performed: A-FLUTTER ABLATION (N/A )     Patient location during evaluation: PACU Anesthesia Type: General Level of consciousness: awake and alert Pain management: pain level controlled Vital Signs Assessment: post-procedure vital signs reviewed and stable Respiratory status: spontaneous breathing, nonlabored ventilation, respiratory function stable and patient connected to nasal cannula oxygen Cardiovascular status: blood pressure returned to baseline and stable Postop Assessment: no apparent nausea or vomiting Anesthetic complications: no    Last Vitals:  Vitals:   06/10/18 1642 06/10/18 1814  BP:  (!) 140/102  Pulse:    Resp: 15 20  Temp:    SpO2:      Last Pain:  Vitals:   06/10/18 1017  TempSrc: Temporal  PainSc:                  Karyl Kinnier Anson Peddie

## 2018-06-11 ENCOUNTER — Encounter (HOSPITAL_COMMUNITY): Payer: Self-pay | Admitting: Internal Medicine

## 2018-06-16 MED FILL — CEPHALEXIN 500 MG CAPSULE: 500 | 10 days supply | Qty: 20 | Fill #0

## 2018-06-17 MED FILL — SOTALOL HCL 80 MG TAB: 80 | 30 days supply | Qty: 60 | Fill #1

## 2018-06-17 MED FILL — !ELIQUIS 5MG TABLET: 5 | 30 days supply | Qty: 60 | Fill #0

## 2018-06-17 MED FILL — LISINOPRIL 20 MG TAB: 20 | 30 days supply | Qty: 30 | Fill #1

## 2018-06-17 MED FILL — POTASSIUM CHLORIDE ER 10 ME: 10 | 30 days supply | Qty: 60 | Fill #1

## 2018-06-17 MED FILL — HYDROXYCHLOROQUINE SULFATE: 200 | 30 days supply | Qty: 60 | Fill #3

## 2018-06-17 MED FILL — ROSUVASTATIN CALCIUM 20 MG: 20 | 30 days supply | Qty: 30 | Fill #1

## 2018-06-17 MED FILL — GLIMEPIRIDE 2 MG TABS: 2 | 30 days supply | Qty: 30 | Fill #3

## 2018-07-02 ENCOUNTER — Encounter: Payer: Self-pay | Admitting: Nurse Practitioner

## 2018-07-02 ENCOUNTER — Ambulatory Visit: Payer: Self-pay | Attending: Nurse Practitioner | Admitting: Nurse Practitioner

## 2018-07-02 VITALS — BP 120/79 | HR 94 | Temp 98.1°F | Ht 65.0 in | Wt 236.0 lb

## 2018-07-02 DIAGNOSIS — Z882 Allergy status to sulfonamides status: Secondary | ICD-10-CM | POA: Insufficient documentation

## 2018-07-02 DIAGNOSIS — E1165 Type 2 diabetes mellitus with hyperglycemia: Secondary | ICD-10-CM | POA: Insufficient documentation

## 2018-07-02 DIAGNOSIS — I110 Hypertensive heart disease with heart failure: Secondary | ICD-10-CM | POA: Insufficient documentation

## 2018-07-02 DIAGNOSIS — Z794 Long term (current) use of insulin: Secondary | ICD-10-CM | POA: Insufficient documentation

## 2018-07-02 DIAGNOSIS — Z7901 Long term (current) use of anticoagulants: Secondary | ICD-10-CM | POA: Insufficient documentation

## 2018-07-02 DIAGNOSIS — I10 Essential (primary) hypertension: Secondary | ICD-10-CM

## 2018-07-02 DIAGNOSIS — I509 Heart failure, unspecified: Secondary | ICD-10-CM | POA: Insufficient documentation

## 2018-07-02 DIAGNOSIS — Z7952 Long term (current) use of systemic steroids: Secondary | ICD-10-CM | POA: Insufficient documentation

## 2018-07-02 DIAGNOSIS — F329 Major depressive disorder, single episode, unspecified: Secondary | ICD-10-CM | POA: Insufficient documentation

## 2018-07-02 DIAGNOSIS — I4891 Unspecified atrial fibrillation: Secondary | ICD-10-CM | POA: Insufficient documentation

## 2018-07-02 DIAGNOSIS — Z881 Allergy status to other antibiotic agents status: Secondary | ICD-10-CM | POA: Insufficient documentation

## 2018-07-02 DIAGNOSIS — Z79899 Other long term (current) drug therapy: Secondary | ICD-10-CM | POA: Insufficient documentation

## 2018-07-02 DIAGNOSIS — M329 Systemic lupus erythematosus, unspecified: Secondary | ICD-10-CM | POA: Insufficient documentation

## 2018-07-02 LAB — GLUCOSE, POCT (MANUAL RESULT ENTRY): POC Glucose: 172 mg/dl — AB (ref 70–99)

## 2018-07-02 LAB — POCT GLYCOSYLATED HEMOGLOBIN (HGB A1C): Hemoglobin A1C: 9 % — AB (ref 4.0–5.6)

## 2018-07-02 MED ORDER — INSULIN GLARGINE 100 UNIT/ML ~~LOC~~ SOLN: 16.0000 [IU] | Freq: Every day | SUBCUTANEOUS | 2 refills | Status: DC

## 2018-07-02 MED ORDER — METOPROLOL TARTRATE 25 MG PO TABS: 12.5000 mg | ORAL_TABLET | Freq: Two times a day (BID) | ORAL | 0 refills | Status: DC

## 2018-07-02 MED ORDER — GLUCOSE BLOOD VI STRP: ORAL_STRIP | 12 refills | Status: DC

## 2018-07-02 MED ORDER — FUROSEMIDE 40 MG PO TABS: 40.0000 mg | ORAL_TABLET | Freq: Two times a day (BID) | ORAL | 3 refills | Status: DC

## 2018-07-02 MED FILL — !LANTUS 100 UNITS/ML VIAL: 100 | 28 days supply | Qty: 10 | Fill #0

## 2018-07-02 MED FILL — FUROSEMIDE 40 MG TAB: 40 | 30 days supply | Qty: 60 | Fill #0

## 2018-07-02 MED FILL — TRUE METRIX TEST STRIP: 30 days supply | Qty: 100 | Fill #1

## 2018-07-02 NOTE — Progress Notes (Signed)
Assessment & Plan:  [name] was seen today for hospitalization follow-up.  Diagnoses and all orders for this visit:  Essential hypertension Chronic. Stable.  -     furosemide (LASIX) 40 MG tablet; Take 1 tablet (40 mg total) by mouth 2 (two) times daily. Continue all antihypertensives as prescribed.  Remember to bring in your blood pressure log with you for your follow up appointment.  DASH/Mediterranean Diets are healthier choices for HTN.   Atrial fibrillation status post cardioversion [facility]) Currently RESOLVED Will follow up with Dr. Lovena Pittman regarding sotalol and metoprolol. Instructed patient to continue current medications as prescribed.    Type 2 diabetes mellitus with hyperglycemia, without long-term current use of insulin (HCC) Chronic. Not well controlled.  -     Glucose (CBG) -     HgB A1c -     glucose blood (TRUE METRIX BLOOD GLUCOSE TEST) test strip; Use as instructed -     insulin glargine (LANTUS) 100 UNIT/ML injection; Inject 0.16 mLs (16 Units total) into the skin daily.4 Continue blood sugar control as discussed in office today, low carbohydrate diet, and regular physical exercise as tolerated, 150 minutes per week (30 min each day, 5 days per week, or 50 min 3 days per week). Keep blood sugar logs with fasting goal of 90-130 mg/dl, post prandial (after you eat) less than 180.  For Hypoglycemia: BS <60 and Hyperglycemia BS >400; contact the clinic ASAP. Annual eye exams and foot exams are recommended.    Patient has been counseled on age-appropriate routine health concerns for screening and prevention. These are reviewed and up-to-date. Referrals have been placed accordingly. Immunizations are up-to-date or declined.    Subjective:   Chief Complaint  Patient presents with  . Hospitalization Follow-up   HPI [name] 42 y.o. female presents to office today for hospital follow up.   Atrial Fib She was hospitalized with atrial flutter with RVR on  05-12-2018. She required Sotalol and converted to NSR. She was discharged on Eliquis and Sotalol.  On 05-23-2018 she was hospitalized with afib and required a cardioversion. She is now s/p ablation 06-10-2018. She continues NSR today on physical exam. Denies any chest pain, shortness of breath, palpitations, lightheadedness or dizziness.  She endorses medication compliance taking Sotalol 63m daily, metoprolol 12.58mBID, lisinopril 2092maily and amlodipine 29m50mily.  I will confirm with Dr. TaylLovena Let she should be taking metoprolol 12.5mg 18m and Sotalol 80mg 38my.   Essential Hypertension Blood pressure well controlled today. She is trying to monitor her sodium intake. Had not been able to exercise due to her shortness of breath and palpitations.  BP Readings from Last 3 Encounters:  07/02/18 120/79  06/10/18 (!) 140/102  05/25/18 (!) 148/98   DM type 2 Chronic. A1c has not improved greatly however she has been on long term steroids for her lupus for a while now. Recently saw a rheumatologist and was taken off prednisone. Hopefully will see a reduction in her A1c over the next few months. Current medications include metformin 1000 mg BID, lantus 16units daily and glimiperide 2 mg daily. She denies any hypo or hyperglycemic symptoms. We also discussed dietary modifications today. Goal is 5-10lbs by next office visit as well as lowering her a1c to 8 or less.  Lab Results  Component Value Date   HGBA1C 9.0 (A) 07/02/2018   Review of Systems  Constitutional: Negative for fever, malaise/fatigue and weight loss.  HENT: Negative.  Negative for nosebleeds.   Eyes:  Negative.  Negative for blurred vision, double vision and photophobia.  Respiratory: Negative.  Negative for cough and shortness of breath.   Cardiovascular: Negative.  Negative for chest pain, palpitations and leg swelling.  Gastrointestinal: Negative.  Negative for heartburn, nausea and vomiting.  Musculoskeletal: Negative.   Negative for myalgias.  Neurological: Negative.  Negative for dizziness, focal weakness, seizures and headaches.  Psychiatric/Behavioral: Negative.  Negative for suicidal ideas.    Past Medical History:  Diagnosis Date  . Abnormal Pap smear of cervix    colpo, HPV  . Allergy   . Anemia   . CHF (congestive heart failure) ([place])   . Depression    after losses  . Enlarged heart    managed by cardiology  . Fibroid   . Gestational diabetes   . Heart murmur   . Hypertension   . Infection    UTI  . Lupus ([place]) 1999    Past Surgical History:  Procedure Laterality Date  . A-FLUTTER ABLATION N/A 06/10/2018   Procedure: A-FLUTTER ABLATION;  Surgeon: Katherine Lance, MD;  Location: [place] [facility];  Service: Cardiovascular;  Laterality: N/A;  . RIGHT/LEFT HEART CATH AND CORONARY ANGIOGRAPHY N/A 03/14/2018   Procedure: RIGHT/LEFT HEART CATH AND CORONARY ANGIOGRAPHY;  Surgeon: Katherine Mormon, MD;  Location: [place] [facility];  Service: Cardiovascular;  Laterality: N/A;  . THERAPEUTIC ABORTION      Family History  Problem Relation Age of Onset  . Cancer Maternal Grandfather        lung  . Diabetes Mother   . Hypertension Maternal Grandmother   . Diabetes Father   . Hearing loss Neg Hx     Social History Reviewed with no changes to be made today.   Outpatient Medications Prior to Visit  Medication Sig Dispense Refill  . acetaminophen (TYLENOL) 325 MG tablet Take 2 tablets (650 mg total) by mouth every 4 (four) hours as needed for headache or mild pain.    Marland Kitchen amLODipine (NORVASC) 10 MG tablet Take 1 tablet (10 mg total) by mouth daily. 30 tablet 0  . apixaban (ELIQUIS) 5 MG TABS tablet Take 1 tablet (5 mg total) by mouth 2 (two) times daily. 60 tablet   . Blood Glucose Monitoring Suppl (TRUE METRIX METER) w/Device KIT Check blood sugars fasting and at bedtime 1 kit 0  . colchicine 0.6 MG tablet Take 0.6 mg by mouth 2 (two) times daily.    Marland Kitchen glimepiride (AMARYL) 2 MG tablet  Take 1 tablet (2 mg total) by mouth every morning. 30 tablet 11  . hydroxychloroquine (PLAQUENIL) 200 MG tablet Take 1 tablet (200 mg total) by mouth 2 (two) times daily. 60 tablet 4  . lisinopril (PRINIVIL,ZESTRIL) 20 MG tablet Take 1 tablet (20 mg total) by mouth daily. 90 tablet 0  . metFORMIN (GLUCOPHAGE) 1000 MG tablet Take 1 tablet (1,000 mg total) by mouth 2 (two) times daily with a meal. 60 tablet 3  . potassium chloride (K-DUR) 10 MEQ tablet Take 1 tablet (10 mEq total) by mouth 2 (two) times daily. 60 tablet 1  . rosuvastatin (CRESTOR) 20 MG tablet Take 1 tablet (20 mg total) by mouth daily at 6 PM. 90 tablet 0  . sotalol (BETAPACE) 80 MG tablet Take 1 tablet (80 mg total) by mouth 2 (two) times daily. 60 tablet 1  . TRUEPLUS LANCETS 28G MISC Check blood sugars as directed 100 each 1  . furosemide (LASIX) 40 MG tablet Take 1 tablet (40 mg total)  by mouth 2 (two) times daily. 60 tablet 3  . glucose blood (TRUE METRIX BLOOD GLUCOSE TEST) test strip Use as instructed 100 each 12  . insulin glargine (LANTUS) 100 UNIT/ML injection Inject 0.16 mLs (16 Units total) into the skin daily. 10 mL 2  . metoprolol tartrate (LOPRESSOR) 25 MG tablet Take 0.5 tablets (12.5 mg total) by mouth 2 (two) times daily. 60 tablet 0  . HYDROcodone-acetaminophen (NORCO/VICODIN) 5-325 MG tablet Take 1 tablet by mouth every 6 (six) hours as needed for moderate pain. (Patient not taking: Reported on 07/02/2018) 12 tablet 0  . omeprazole (PRILOSEC) 20 MG capsule Take 1 capsule (20 mg total) by mouth daily. 90 capsule 3  . clindamycin (CLEOCIN) 300 MG capsule Take 1 capsule (300 mg total) by mouth every 8 (eight) hours. 18 capsule 0   No facility-administered medications prior to visit.     Allergies  Allergen Reactions  . Carrot Oil Shortness Of Breath, Rash and Other (See Comments)    Bumps on tongue, also  . Celery Oil Shortness Of Breath, Rash and Other (See Comments)    Bumps on tongue, also  .  Peanut-Containing Drug Products Anaphylaxis, Shortness Of Breath, Itching, Rash and Other (See Comments)    Bumps on tongue, also  . Septra [Bactrim] Hives       Objective:    BP 120/79 (BP Location: Left Arm, Patient Position: Sitting, Cuff Size: Large)   Pulse 94   Temp 98.1 F (36.7 C) (Oral)   Ht 5' 5"  (1.651 m)   Wt 236 lb (107 kg)   SpO2 96%   BMI 39.27 kg/m  Wt Readings from Last 3 Encounters:  07/02/18 236 lb (107 kg)  06/10/18 225 lb (102.1 kg)  05/25/18 222 lb 14.2 oz (101.1 kg)    Physical Exam  Constitutional: She is oriented to person, place, and time. She appears well-developed and well-nourished. She is cooperative.  HENT:  Head: Normocephalic and atraumatic.  Eyes: EOM are normal.  Neck: Normal range of motion.  Cardiovascular: Normal rate, regular rhythm and intact distal pulses. Exam reveals no gallop and no friction rub.  Murmur heard. Pulmonary/Chest: Effort normal and breath sounds normal. No tachypnea. No respiratory distress. She has no decreased breath sounds. She has no wheezes. She has no rhonchi. She has no rales. She exhibits no tenderness.  Abdominal: Soft. Bowel sounds are normal.  Musculoskeletal: Normal range of motion. She exhibits no edema.  Neurological: She is alert and oriented to person, place, and time. Coordination normal.  Skin: Skin is warm and dry.  Psychiatric: She has a normal mood and affect. Her behavior is normal. Judgment and thought content normal.  Nursing note and vitals reviewed.       Patient has been counseled extensively about nutrition and exercise as well as the importance of adherence with medications and regular follow-up. The patient was given clear instructions to go to ER or return to medical center if symptoms don't improve, worsen or new problems develop. The patient verbalized understanding.   Follow-up: Return in about 3 months (around 10/02/2018) for DM.   Gildardo Pounds, FNP-BC [facility] [place], [place]   07/02/2018, 10:02 PM

## 2018-07-02 NOTE — Progress Notes (Signed)
90

## 2018-07-04 ENCOUNTER — Ambulatory Visit: Payer: Self-pay | Attending: Nurse Practitioner

## 2018-07-04 ENCOUNTER — Telehealth: Payer: Self-pay | Admitting: Internal Medicine

## 2018-07-04 ENCOUNTER — Telehealth: Payer: Self-pay

## 2018-07-04 NOTE — Telephone Encounter (Signed)
Advised Pt should continue on both metoprolol and sotalol until her 4 wk f/u with Dr. Lovena Le.  Will address medication changes at that time.

## 2018-07-04 NOTE — Telephone Encounter (Signed)
CMA spoke to patient to inform to stop taking Metoprolol and only be taking Sotalol.  Pt. Verified DOB. Pt. Understood.

## 2018-07-04 NOTE — Telephone Encounter (Signed)
-----   Message from Gildardo Pounds, NP sent at 07/02/2018  9:39 PM EST ----- Please call patient and let her know not to take metoprolol. She should only be taking Sotalol.

## 2018-07-04 NOTE — Telephone Encounter (Signed)
She would like to clarify if patient should be on both the Sotalol and metoprolol. She will be in the office until 5:30pm today.

## 2018-07-08 ENCOUNTER — Ambulatory Visit: Payer: Medicaid Other

## 2018-07-09 ENCOUNTER — Other Ambulatory Visit (HOSPITAL_COMMUNITY)
Admission: RE | Admit: 2018-07-09 | Discharge: 2018-07-09 | Disposition: A | Discharge status: Home / Self Care | Payer: Medicaid Other | Source: Ambulatory Visit | Attending: Obstetrics and Gynecology | Admitting: Obstetrics and Gynecology

## 2018-07-09 ENCOUNTER — Encounter: Payer: Self-pay | Admitting: Internal Medicine

## 2018-07-09 ENCOUNTER — Ambulatory Visit (INDEPENDENT_AMBULATORY_CARE_PROVIDER_SITE_OTHER): Payer: Medicaid Other | Admitting: Obstetrics and Gynecology

## 2018-07-09 ENCOUNTER — Ambulatory Visit (INDEPENDENT_AMBULATORY_CARE_PROVIDER_SITE_OTHER): Payer: Self-pay | Admitting: Internal Medicine

## 2018-07-09 VITALS — BP 115/85 | HR 87 | Wt 234.7 lb

## 2018-07-09 VITALS — BP 122/84 | HR 104 | Ht 65.0 in | Wt 237.0 lb

## 2018-07-09 DIAGNOSIS — Z23 Encounter for immunization: Secondary | ICD-10-CM

## 2018-07-09 DIAGNOSIS — R87629 Unspecified abnormal cytological findings in specimens from vagina: Secondary | ICD-10-CM | POA: Diagnosis present

## 2018-07-09 DIAGNOSIS — Z01419 Encounter for gynecological examination (general) (routine) without abnormal findings: Secondary | ICD-10-CM

## 2018-07-09 DIAGNOSIS — Z3042 Encounter for surveillance of injectable contraceptive: Secondary | ICD-10-CM | POA: Diagnosis not present

## 2018-07-09 DIAGNOSIS — I483 Typical atrial flutter: Secondary | ICD-10-CM

## 2018-07-09 DIAGNOSIS — Z0000 Encounter for general adult medical examination without abnormal findings: Secondary | ICD-10-CM | POA: Diagnosis present

## 2018-07-09 DIAGNOSIS — N898 Other specified noninflammatory disorders of vagina: Secondary | ICD-10-CM | POA: Diagnosis not present

## 2018-07-09 MED ORDER — MEDROXYPROGESTERONE ACETATE 150 MG/ML IM SUSP
150.0000 mg | Freq: Once | INTRAMUSCULAR | Status: AC
  Administered 2018-07-09: 150 mg via INTRAMUSCULAR

## 2018-07-09 NOTE — Progress Notes (Signed)
HPI [name] returns today for followup after undergoing EPS/RFA of typical atrial flutter. She is a pleasant 42 yo woman who underwent catheter ablation about a month ago. She has a h/o obesity, DM, and diastolic heart failure. Since her ablation she has not experienced any additional arrhythmias. No chest pain or sob. She is bothered by joint pain.  Allergies  Allergen Reactions  . Carrot Oil Shortness Of Breath, Rash and Other (See Comments)    Bumps on tongue, also  . Celery Oil Shortness Of Breath, Rash and Other (See Comments)    Bumps on tongue, also  . Peanut-Containing Drug Products Anaphylaxis, Shortness Of Breath, Itching, Rash and Other (See Comments)    Bumps on tongue, also  . Septra [Bactrim] Hives     Current Outpatient Medications  Medication Sig Dispense Refill  . acetaminophen (TYLENOL) 325 MG tablet Take 2 tablets (650 mg total) by mouth every 4 (four) hours as needed for headache or mild pain.    Marland Kitchen amLODipine (NORVASC) 10 MG tablet Take 1 tablet (10 mg total) by mouth daily. 30 tablet 0  . apixaban (ELIQUIS) 5 MG TABS tablet Take 1 tablet (5 mg total) by mouth 2 (two) times daily. 60 tablet   . Blood Glucose Monitoring Suppl (TRUE METRIX METER) w/Device KIT Check blood sugars fasting and at bedtime 1 kit 0  . colchicine 0.6 MG tablet Take 0.6 mg by mouth 2 (two) times daily.    . furosemide (LASIX) 40 MG tablet Take 1 tablet (40 mg total) by mouth 2 (two) times daily. 60 tablet 3  . glimepiride (AMARYL) 2 MG tablet Take 1 tablet (2 mg total) by mouth every morning. 30 tablet 11  . glucose blood (TRUE METRIX BLOOD GLUCOSE TEST) test strip Use as instructed 100 each 12  . hydroxychloroquine (PLAQUENIL) 200 MG tablet Take 1 tablet (200 mg total) by mouth 2 (two) times daily. 60 tablet 4  . insulin glargine (LANTUS) 100 UNIT/ML injection Inject 0.16 mLs (16 Units total) into the skin daily. 10 mL 2  . lisinopril (PRINIVIL,ZESTRIL) 20 MG tablet Take 1 tablet  (20 mg total) by mouth daily. 90 tablet 0  . metFORMIN (GLUCOPHAGE) 1000 MG tablet Take 1 tablet (1,000 mg total) by mouth 2 (two) times daily with a meal. 60 tablet 3  . potassium chloride (K-DUR) 10 MEQ tablet Take 1 tablet (10 mEq total) by mouth 2 (two) times daily. 60 tablet 1  . rosuvastatin (CRESTOR) 20 MG tablet Take 1 tablet (20 mg total) by mouth daily at 6 PM. 90 tablet 0  . sotalol (BETAPACE) 80 MG tablet Take 1 tablet (80 mg total) by mouth 2 (two) times daily. 60 tablet 1  . TRUEPLUS LANCETS 28G MISC Check blood sugars as directed 100 each 1  . omeprazole (PRILOSEC) 20 MG capsule Take 1 capsule (20 mg total) by mouth daily. (Patient not taking: Reported on 07/09/2018) 90 capsule 3   No current facility-administered medications for this visit.      Past Medical History:  Diagnosis Date  . Abnormal Pap smear of cervix    colpo, HPV  . Allergy   . Anemia   . CHF (congestive heart failure) ([place])   . Depression    after losses  . Enlarged heart    managed by cardiology  . Fibroid   . Gestational diabetes   . Heart murmur   . Hypertension   . Infection    UTI  .  Lupus ([place]) 1999    ROS:   All systems reviewed and negative except as noted in the HPI.   Past Surgical History:  Procedure Laterality Date  . A-FLUTTER ABLATION N/A 06/10/2018   Procedure: A-FLUTTER ABLATION;  Surgeon: Evans Lance, MD;  Location: [place] [facility];  Service: Cardiovascular;  Laterality: N/A;  . RIGHT/LEFT HEART CATH AND CORONARY ANGIOGRAPHY N/A 03/14/2018   Procedure: RIGHT/LEFT HEART CATH AND CORONARY ANGIOGRAPHY;  Surgeon: [name], MD;  Location: [place] [facility];  Service: Cardiovascular;  Laterality: N/A;  . THERAPEUTIC ABORTION       Family History  Problem Relation Age of Onset  . Cancer Maternal Grandfather        lung  . Diabetes Mother   . Hypertension Maternal Grandmother   . Diabetes Father   . Hearing loss Neg Hx      Social History    Socioeconomic History  . Marital status: Married    Spouse name: Not on file  . Number of children: Not on file  . Years of education: Not on file  . Highest education level: Not on file  Occupational History  . Not on file  Social Needs  . Financial resource strain: Not on file  . Food insecurity:    Worry: Patient refused    Inability: Patient refused  . Transportation needs:    Medical: Patient refused    Non-medical: Patient refused  Tobacco Use  . Smoking status: Never Smoker  . Smokeless tobacco: Never Used  Substance and Sexual Activity  . Alcohol use: Yes    Comment: ocasionally   . Drug use: No  . Sexual activity: Yes    Birth control/protection: Injection  Lifestyle  . Physical activity:    Days per week: Patient refused    Minutes per session: Patient refused  . Stress: Not on file  Relationships  . Social connections:    Talks on phone: Patient refused    Gets together: Patient refused    Attends religious service: Patient refused    Active member of club or organization: Patient refused    Attends meetings of clubs or organizations: Patient refused    Relationship status: Patient refused  . Intimate partner violence:    Fear of current or ex partner: Patient refused    Emotionally abused: Patient refused    Physically abused: Patient refused    Forced sexual activity: Patient refused  Other Topics Concern  . Not on file  Social History Narrative  . Not on file     BP 122/84   Pulse (!) 104   Ht 5' 5"  (1.651 m)   Wt 237 lb (107.5 kg)   BMI 39.44 kg/m   Physical Exam:  Well appearing with moon facies, NAD HEENT: Unremarkable Neck:  No JVD, no thyromegally Lymphatics:  No adenopathy Back:  No CVA tenderness Lungs:  Clear with no wheezes HEART:  Regular rate rhythm, no murmurs, no rubs, no clicks Abd:  soft, positive bowel sounds, no organomegally, no rebound, no guarding Ext:  2 plus pulses, no edema, no cyanosis, no clubbing Skin:  No  rashes no nodules Neuro:  CN II through XII intact, motor grossly intact  EKG - sinus tachycardia   Assess/Plan: 1. Atrial flutter - she has had no recurrent symptoms since her ablation. She will continue her current meds. 2. SLE - she is following up and having her arthritis meds adjusted. 3. Obesity - she is encouraged to lose  weight.  Mikle Bosworth.D.

## 2018-07-09 NOTE — Addendum Note (Signed)
Addended by: Fidela Juneau A on: 07/09/2018 10:29 AM   Modules accepted: Orders

## 2018-07-09 NOTE — Progress Notes (Addendum)
GYNECOLOGY ANNUAL PREVENTATIVE CARE ENCOUNTER NOTE  Subjective:   [name] is a 42 y.o. [id number] female here for a routine annual gynecologic exam.  Current complaints: vaginal itching and some discharge. She was recently on antibiotics and thinks she may have a yeast infection.    Denies abnormal vaginal bleeding, discharge, pelvic pain, problems with intercourse or other gynecologic concerns.    Gynecologic History No LMP recorded. Patient has had an injection. Contraception: Depo-Provera injections Last Pap: 2018. Results were: abnormal with + HPV Last mammogram: N/A, she has never had one before.   Obstetric History OB History  Gravida Para Term Preterm AB Living  5 2 0 2 3 2   SAB TAB Ectopic Multiple Live Births  2 1 0 0 2    # Outcome Date GA Lbr Len/2nd Weight Sex Delivery Anes PTL Lv  5 SAB           4 SAB  [redacted]w[redacted]d  M         Birth Comments: iufd  3 TAB           2 Preterm  [id number]:00     Y LIV  1 Preterm  [id number]:00 6 lb 3 oz (2.807 kg) M Vag-Spont None Y LIV    Past Medical History:  Diagnosis Date  . Abnormal Pap smear of cervix    colpo, HPV  . Allergy   . Anemia   . CHF (congestive heart failure) ([facility] . Depression    after losses  . Enlarged heart    managed by cardiology  . Fibroid   . Gestational diabetes   . Heart murmur   . Hypertension   . Infection    UTI  . Lupus ([facility]999    Past Surgical History:  Procedure Laterality Date  . A-FLUTTER ABLATION N/A 06/10/2018   Procedure: A-FLUTTER ABLATION;  Surgeon: TayEvans LanceD;  Location: [facility];  Service: Cardiovascular;  Laterality: N/A;  . RIGHT/LEFT HEART CATH AND CORONARY ANGIOGRAPHY N/A 03/14/2018   Procedure: RIGHT/LEFT HEART CATH AND CORONARY ANGIOGRAPHY;  Surgeon: PatNigel MormonD;  Location: [facility];  Service: Cardiovascular;  Laterality: N/A;  . THERAPEUTIC ABORTION      Current Outpatient Medications on File Prior to Visit  Medication  Sig Dispense Refill  . acetaminophen (TYLENOL) 325 MG tablet Take 2 tablets (650 mg total) by mouth every 4 (four) hours as needed for headache or mild pain.    . aMarland KitchenLODipine (NORVASC) 10 MG tablet Take 1 tablet (10 mg total) by mouth daily. 30 tablet 0  . apixaban (ELIQUIS) 5 MG TABS tablet Take 1 tablet (5 mg total) by mouth 2 (two) times daily. 60 tablet   . Blood Glucose Monitoring Suppl (TRUE METRIX METER) w/Device KIT Check blood sugars fasting and at bedtime 1 kit 0  . colchicine 0.6 MG tablet Take 0.6 mg by mouth 2 (two) times daily.    . furosemide (LASIX) 40 MG tablet Take 1 tablet (40 mg total) by mouth 2 (two) times daily. 60 tablet 3  . glimepiride (AMARYL) 2 MG tablet Take 1 tablet (2 mg total) by mouth every morning. 30 tablet 11  . glucose blood (TRUE METRIX BLOOD GLUCOSE TEST) test strip Use as instructed 100 each 12  . hydroxychloroquine (PLAQUENIL) 200 MG tablet Take 1 tablet (200 mg total) by mouth 2 (two) times daily. 60 tablet 4  . insulin glargine (LANTUS) 100 UNIT/ML  injection Inject 0.16 mLs (16 Units total) into the skin daily. 10 mL 2  . lisinopril (PRINIVIL,ZESTRIL) 20 MG tablet Take 1 tablet (20 mg total) by mouth daily. 90 tablet 0  . metFORMIN (GLUCOPHAGE) 1000 MG tablet Take 1 tablet (1,000 mg total) by mouth 2 (two) times daily with a meal. 60 tablet 3  . potassium chloride (K-DUR) 10 MEQ tablet Take 1 tablet (10 mEq total) by mouth 2 (two) times daily. 60 tablet 1  . rosuvastatin (CRESTOR) 20 MG tablet Take 1 tablet (20 mg total) by mouth daily at 6 PM. 90 tablet 0  . sotalol (BETAPACE) 80 MG tablet Take 1 tablet (80 mg total) by mouth 2 (two) times daily. 60 tablet 1  . TRUEPLUS LANCETS 28G MISC Check blood sugars as directed 100 each 1  . omeprazole (PRILOSEC) 20 MG capsule Take 1 capsule (20 mg total) by mouth daily. (Patient not taking: Reported on 07/09/2018) 90 capsule 3   No current facility-administered medications on file prior to visit.     Allergies    Allergen Reactions  . Carrot Oil Shortness Of Breath, Rash and Other (See Comments)    Bumps on tongue, also  . Celery Oil Shortness Of Breath, Rash and Other (See Comments)    Bumps on tongue, also  . Peanut-Containing Drug Products Anaphylaxis, Shortness Of Breath, Itching, Rash and Other (See Comments)    Bumps on tongue, also  . Septra [Bactrim] Hives    Social History:  reports that she has never smoked. She has never used smokeless tobacco. She reports that she drinks alcohol. She reports that she does not use drugs.  Family History  Problem Relation Age of Onset  . Cancer Maternal Grandfather        lung  . Diabetes Mother   . Hypertension Maternal Grandmother   . Diabetes Father   . Hearing loss Neg Hx     The following portions of the patient's history were reviewed and updated as appropriate: allergies, current medications, past family history, past medical history, past social history, past surgical history and problem list.  Review of Systems Pertinent items noted in HPI and remainder of comprehensive ROS otherwise negative.   Objective:  BP 115/85   Pulse 87   Wt 234 lb 11.2 oz (106.5 kg)   BMI 39.06 kg/m  CONSTITUTIONAL: Well-developed, well-nourished female in no acute distress.  HENT:  Normocephalic, atraumatic, External right and left ear normal. Oropharynx is clear and moist EYES: Conjunctivae and EOM are normal. Pupils are equal, round, and reactive to light. No scleral icterus.  NECK: Normal range of motion, supple, no masses.  Normal thyroid.  SKIN: Skin is warm and dry. No rash noted. Not diaphoretic. No erythema. No pallor. MUSCULOSKELETAL: Normal range of motion. No tenderness.  No cyanosis, clubbing, or edema.  2+ distal pulses. NEUROLOGIC: Alert and oriented to person, place, and time. Normal reflexes, muscle tone coordination. No cranial nerve deficit noted. PSYCHIATRIC: Normal mood and affect. Normal behavior. Normal judgment and thought  content. CARDIOVASCULAR: Normal heart rate noted, regular rhythm RESPIRATORY: Clear to auscultation bilaterally. Effort and breath sounds normal, no problems with respiration noted. BREASTS: Symmetric in size. No masses, skin changes, nipple drainage, or lymphadenopathy. ABDOMEN: Soft, normal bowel sounds, no distention noted.  No tenderness, rebound or guarding.  PELVIC: Normal appearing external genitalia; normal appearing vaginal mucosa and cervix.  No abnormal discharge noted.  Pap smear obtained.  Normal uterine size, no other palpable masses, no uterine or  adnexal tenderness.  Assessment and Plan:   1. Encounter for annual routine gynecological examination  - Abnormal pap 12 months ago. Colpo not done. Pap done today, if abnormal will refer to [facility]po. Patient is aware of this  2. Abnormal vaginal Pap smear  - Will follow up based on today's pap results   3. Vaginal irritation  - Wet prep collected today & GC, patient requesting STI swabs today.  - patient referred to [facility] for completion of STI testing; RPR and HIV  4. Depo provera contraceptive status   - Depo injection given    There are no diagnoses linked to this encounter. Will follow up results of pap smear and manage accordingly. Mammogram scheduled through [facility] Routine preventative health maintenance measures emphasized. Please refer to After Visit Summary for other counseling recommendations.     Courtland Reas, Artist Pais, NP [facility], [place]

## 2018-07-09 NOTE — Patient Instructions (Signed)
Colposcopy, Care After  This sheet gives you information about how to care for yourself after your procedure. Your doctor may also give you more specific instructions. If you have problems or questions, contact your doctor.  What can I expect after the procedure?  If you did not have a tissue sample removed (did not have a biopsy), you may only have some spotting for a few days. You can go back to your normal activities.  If you had a tissue sample removed, it is common to have:  · Soreness and pain. This may last for a few days.  · Light-headedness.  · Mild bleeding from your vagina or dark-colored, grainy discharge from your vagina. This may last for a few days. You may need to wear a sanitary pad.  · Spotting for at least 48 hours after the procedure.    Follow these instructions at home:  · Take over-the-counter and prescription medicines only as told by your doctor. Ask your doctor what medicines you can start taking again. This is very important if you take blood-thinning medicine.  · Do not drive or use heavy machinery while taking prescription pain medicine.  · For 3 days, or as long as your doctor tells you, avoid:  ? Douching.  ? Using tampons.  ? Having sex.  · If you use birth control (contraception), keep using it.  · Limit activity for the first day after the procedure. Ask your doctor what activities are safe for you.  · It is up to you to get the results of your procedure. Ask your doctor when your results will be ready.  · Keep all follow-up visits as told by your doctor. This is important.  Contact a doctor if:  · You get a skin rash.  Get help right away if:  · You are bleeding a lot from your vagina. It is a lot of bleeding if you are using more than one pad an hour for 2 hours in a row.  · You have clumps of blood (blood clots) coming from your vagina.  · You have a fever.  · You have chills  · You have pain in your lower belly (pelvic area).  · You have signs of infection, such as vaginal  discharge that is:  ? Different than usual.  ? Yellow.  ? Bad-smelling.  · You have very pain or cramps in your lower belly that do not get better with medicine.  · You feel light-headed.  · You feel dizzy.  · You pass out (faint).  Summary  · If you did not have a tissue sample removed (did not have a biopsy), you may only have some spotting for a few days. You can go back to your normal activities.  · If you had a tissue sample removed, it is common to have mild pain and spotting for 48 hours.  · For 3 days, or as long as your doctor tells you, avoid douching, using tampons and having sex.  · Get help right away if you have bleeding, very bad pain, or signs of infection.  This information is not intended to replace advice given to you by your health care provider. Make sure you discuss any questions you have with your health care provider.  Document Released: 01/16/2008 Document Revised: 04/18/2016 Document Reviewed: 04/18/2016  Elsevier Interactive Patient Education © 2018 [employer]

## 2018-07-09 NOTE — Patient Instructions (Signed)
Medication Instructions:  Your physician recommends that you continue on your current medications as directed. Please refer to the Current Medication list given to you today.  Labwork: None ordered.  Testing/Procedures: None ordered.  Follow-Up: Your physician wants you to follow-up in: as needed with Dr. Taylor.   You will receive a reminder letter in the mail two months in advance. If you don't receive a letter, please call our office to schedule the follow-up appointment.   Any Other Special Instructions Will Be Listed Below (If Applicable).  If you need a refill on your cardiac medications before your next appointment, please call your pharmacy.   

## 2018-07-09 NOTE — Addendum Note (Signed)
Addended by: Dolores Hoose on: 07/09/2018 10:52 AM   Modules accepted: Orders

## 2018-07-14 ENCOUNTER — Telehealth (HOSPITAL_COMMUNITY): Payer: Self-pay | Admitting: Obstetrics and Gynecology

## 2018-07-14 ENCOUNTER — Telehealth: Payer: Self-pay | Admitting: *Deleted

## 2018-07-14 NOTE — Telephone Encounter (Signed)
I called [name] and left a message I am returning her call and that she can see her results on MyChart when they are available- call us or send [place] a message if you have questions.

## 2018-07-14 NOTE — Telephone Encounter (Signed)
Received a voice message this am stating she is still having some discomfort and hasn't gotten results.

## 2018-07-14 NOTE — Telephone Encounter (Signed)
Called patient and left message regarding scheduling sreening mammogram.

## 2018-07-15 LAB — CYTOLOGY - PAP
Bacterial vaginitis: NEGATIVE
Candida vaginitis: POSITIVE — AB
Chlamydia: NEGATIVE
Diagnosis: UNDETERMINED — AB
HPV 16/18/45 genotyping: POSITIVE — AB
HPV: DETECTED — AB
Neisseria Gonorrhea: NEGATIVE
Trichomonas: NEGATIVE

## 2018-07-17 ENCOUNTER — Other Ambulatory Visit: Payer: Self-pay | Admitting: Obstetrics and Gynecology

## 2018-07-17 MED ORDER — FLUCONAZOLE 150 MG PO TABS: 150.0000 mg | ORAL_TABLET | Freq: Once | ORAL | 0 refills | Status: AC

## 2018-07-17 MED FILL — FLUCONAZOLE 150 MG TABS: 150 | 4 days supply | Qty: 2 | Fill #0

## 2018-07-17 NOTE — Progress Notes (Signed)
Abnormal pap with candida. Sent message through [employer]. Rx diflucan  Rasch, Anderson Malta I, NP 07/17/2018 8:27 AM

## 2018-07-18 MED FILL — ELIQUIS 5 MG TABLET: 5 | 30 days supply | Qty: 60 | Fill #1

## 2018-07-18 MED FILL — LISINOPRIL 20 MG TAB: 20 | 30 days supply | Qty: 30 | Fill #2

## 2018-07-18 MED FILL — ROSUVASTATIN CALCIUM 20 MG: 20 | 30 days supply | Qty: 30 | Fill #2

## 2018-07-18 MED FILL — GLIMEPIRIDE 2 MG TABS: 2 | 30 days supply | Qty: 30 | Fill #4

## 2018-07-21 ENCOUNTER — Telehealth: Payer: Self-pay | Admitting: Nurse Practitioner

## 2018-07-21 NOTE — Telephone Encounter (Signed)
I called Pt to give her an update of her CAFA application  Denied for Financial Assistance due to  [07/21/2018 10:33 AM]  [name]:   [name] Per note on account 04/29/2018 [address] Financial Counseling Review Complete 12/24/17 thru 04/01/18 dos--medicaid denied... [name] [place]  Financial Counseling Review Complete 12/24/17 thru 04/01/18 dos--medicaid denied for failure to provide documentation-- ineligible for charity for failure to comply with medicaid process--accts closed--quality review complete  Patient not eligible for financial assistance for non compliance with medicaid process     Pt understood

## 2018-07-24 MED FILL — JARDIANCE 10 MG TABLET: 10 | 30 days supply | Qty: 30 | Fill #0

## 2018-07-24 MED FILL — metOLazone 2.5 MG TABS: 2.5 | 16 days supply | Qty: 30 | Fill #0

## 2018-07-28 MED FILL — azaTHIOprine 50 MG TABS: 50 | 30 days supply | Qty: 60 | Fill #0

## 2018-08-01 MED FILL — HYDROXYCHLOROQUINE SULFATE: 200 | 30 days supply | Qty: 60 | Fill #4

## 2018-08-11 MED FILL — FUROSEMIDE 40 MG TAB: 40 | 30 days supply | Qty: 60 | Fill #1

## 2018-08-18 MED FILL — HYDROXYCHLOROQUINE SULFATE: 200 | 30 days supply | Qty: 60 | Fill #4

## 2018-08-19 ENCOUNTER — Ambulatory Visit: Payer: Medicaid Other | Admitting: Obstetrics & Gynecology

## 2018-08-19 ENCOUNTER — Other Ambulatory Visit: Payer: Self-pay | Admitting: Nurse Practitioner

## 2018-08-19 MED FILL — GLIMEPIRIDE 2 MG TABS: 2 | 30 days supply | Qty: 30 | Fill #5

## 2018-08-19 MED FILL — LISINOPRIL 20 MG TAB: 20 | 30 days supply | Qty: 30 | Fill #0

## 2018-08-21 ENCOUNTER — Other Ambulatory Visit: Payer: Self-pay | Admitting: Nurse Practitioner

## 2018-08-21 MED FILL — METOPROLOL TARTRATE 25 MG T: 25 | 30 days supply | Qty: 60 | Fill #0

## 2018-08-21 MED FILL — AMLODIPINE BESYLATE 5 MG TA: 5 | 30 days supply | Qty: 30 | Fill #0

## 2018-08-21 MED FILL — !LANTUS 100 UNITS/ML VIAL: 100 | 28 days supply | Qty: 10 | Fill #1

## 2018-08-21 MED FILL — ROSUVASTATIN CALCIUM 20 MG: 20 | 30 days supply | Qty: 30 | Fill #0

## 2018-08-25 ENCOUNTER — Encounter (HOSPITAL_COMMUNITY): Payer: Self-pay

## 2018-08-25 ENCOUNTER — Other Ambulatory Visit: Payer: Self-pay

## 2018-08-25 ENCOUNTER — Emergency Department (HOSPITAL_COMMUNITY): Payer: Self-pay

## 2018-08-25 ENCOUNTER — Inpatient Hospital Stay (HOSPITAL_COMMUNITY)
Admission: EM | Admit: 2018-08-25 | Discharge: 2018-08-27 | DRG: 683 | Disposition: A | Discharge status: Home / Self Care | Payer: Self-pay | Attending: Internal Medicine | Admitting: Internal Medicine

## 2018-08-25 DIAGNOSIS — Z794 Long term (current) use of insulin: Secondary | ICD-10-CM

## 2018-08-25 DIAGNOSIS — N179 Acute kidney failure, unspecified: Principal | ICD-10-CM

## 2018-08-25 DIAGNOSIS — I5032 Chronic diastolic (congestive) heart failure: Secondary | ICD-10-CM

## 2018-08-25 DIAGNOSIS — M329 Systemic lupus erythematosus, unspecified: Secondary | ICD-10-CM

## 2018-08-25 DIAGNOSIS — Z7982 Long term (current) use of aspirin: Secondary | ICD-10-CM

## 2018-08-25 DIAGNOSIS — I10 Essential (primary) hypertension: Secondary | ICD-10-CM

## 2018-08-25 DIAGNOSIS — K2970 Gastritis, unspecified, without bleeding: Secondary | ICD-10-CM | POA: Diagnosis present

## 2018-08-25 DIAGNOSIS — E119 Type 2 diabetes mellitus without complications: Secondary | ICD-10-CM

## 2018-08-25 DIAGNOSIS — E860 Dehydration: Secondary | ICD-10-CM | POA: Diagnosis present

## 2018-08-25 DIAGNOSIS — I110 Hypertensive heart disease with heart failure: Secondary | ICD-10-CM | POA: Diagnosis present

## 2018-08-25 DIAGNOSIS — I5033 Acute on chronic diastolic (congestive) heart failure: Secondary | ICD-10-CM | POA: Diagnosis present

## 2018-08-25 DIAGNOSIS — Z79899 Other long term (current) drug therapy: Secondary | ICD-10-CM

## 2018-08-25 DIAGNOSIS — Z833 Family history of diabetes mellitus: Secondary | ICD-10-CM

## 2018-08-25 DIAGNOSIS — E1165 Type 2 diabetes mellitus with hyperglycemia: Secondary | ICD-10-CM

## 2018-08-25 DIAGNOSIS — Z8249 Family history of ischemic heart disease and other diseases of the circulatory system: Secondary | ICD-10-CM

## 2018-08-25 DIAGNOSIS — I483 Typical atrial flutter: Secondary | ICD-10-CM

## 2018-08-25 DIAGNOSIS — D509 Iron deficiency anemia, unspecified: Secondary | ICD-10-CM | POA: Diagnosis present

## 2018-08-25 DIAGNOSIS — E11649 Type 2 diabetes mellitus with hypoglycemia without coma: Secondary | ICD-10-CM | POA: Diagnosis present

## 2018-08-25 DIAGNOSIS — I4892 Unspecified atrial flutter: Secondary | ICD-10-CM | POA: Diagnosis present

## 2018-08-25 DIAGNOSIS — K529 Noninfective gastroenteritis and colitis, unspecified: Secondary | ICD-10-CM | POA: Diagnosis present

## 2018-08-25 LAB — COMPREHENSIVE METABOLIC PANEL
ALT: 10 U/L (ref 0–44)
AST: 19 U/L (ref 15–41)
Albumin: 3.9 g/dL (ref 3.5–5.0)
Alkaline Phosphatase: 45 U/L (ref 38–126)
Anion gap: 11 (ref 5–15)
BUN: 86 mg/dL — ABNORMAL HIGH (ref 6–20)
CO2: 24 mmol/L (ref 22–32)
Calcium: 9.1 mg/dL (ref 8.9–10.3)
Chloride: 101 mmol/L (ref 98–111)
Creatinine, Ser: 2.62 mg/dL — ABNORMAL HIGH (ref 0.44–1.00)
GFR calc Af Amer: 25 mL/min — ABNORMAL LOW (ref >60)
GFR calc non Af Amer: 22 mL/min — ABNORMAL LOW (ref >60)
Glucose, Bld: 73 mg/dL (ref 70–99)
Potassium: 3.5 mmol/L (ref 3.5–5.1)
Sodium: 136 mmol/L (ref 135–145)
Total Bilirubin: 0.6 mg/dL (ref 0.3–1.2)
Total Protein: 8.5 g/dL — ABNORMAL HIGH (ref 6.5–8.1)

## 2018-08-25 LAB — CBC
HCT: 33.6 % — ABNORMAL LOW (ref 36.0–46.0)
Hemoglobin: 10.7 g/dL — ABNORMAL LOW (ref 12.0–15.0)
MCH: 24.7 pg — ABNORMAL LOW (ref 26.0–34.0)
MCHC: 31.8 g/dL (ref 30.0–36.0)
MCV: 77.6 fL — ABNORMAL LOW (ref 80.0–100.0)
Platelets: 332 10*3/uL (ref 150–400)
RBC: 4.33 MIL/uL (ref 3.87–5.11)
RDW: 13.4 % (ref 11.5–15.5)
WBC: 5.7 10*3/uL (ref 4.0–10.5)
nRBC: 0 % (ref 0.0–0.2)

## 2018-08-25 LAB — URINALYSIS, ROUTINE W REFLEX MICROSCOPIC
Bilirubin Urine: NEGATIVE
Glucose, UA: NEGATIVE mg/dL
Ketones, ur: NEGATIVE mg/dL
Nitrite: NEGATIVE
Protein, ur: 30 mg/dL — AB
Specific Gravity, Urine: 1.011 (ref 1.005–1.030)
pH: 6 (ref 5.0–8.0)

## 2018-08-25 LAB — CBG MONITORING, ED: Glucose-Capillary: 78 mg/dL (ref 70–99)

## 2018-08-25 LAB — LIPASE, BLOOD: Lipase: 92 U/L — ABNORMAL HIGH (ref 11–51)

## 2018-08-25 LAB — I-STAT BETA HCG BLOOD, ED (MC, WL, AP ONLY): I-stat hCG, quantitative: <5 m[IU]/mL (ref <5)

## 2018-08-25 MED ORDER — ROSUVASTATIN CALCIUM 20 MG PO TABS
20.0000 mg | ORAL_TABLET | Freq: Every day | ORAL | Status: DC
  Administered 2018-08-26: 20 mg via ORAL
  Filled 2018-08-25: qty 1

## 2018-08-25 MED ORDER — COLCHICINE 0.6 MG PO TABS
0.6000 mg | ORAL_TABLET | Freq: Two times a day (BID) | ORAL | Status: DC
  Administered 2018-08-25: 0.6 mg via ORAL
  Filled 2018-08-25: qty 1

## 2018-08-25 MED ORDER — POLYETHYLENE GLYCOL 3350 17 G PO PACK: 17.0000 g | PACK | Freq: Every day | ORAL | Status: DC | PRN

## 2018-08-25 MED ORDER — INSULIN ASPART 100 UNIT/ML ~~LOC~~ SOLN: 0.0000 [IU] | Freq: Every day | SUBCUTANEOUS | Status: DC

## 2018-08-25 MED ORDER — HYDROXYCHLOROQUINE SULFATE 200 MG PO TABS
200.0000 mg | ORAL_TABLET | Freq: Two times a day (BID) | ORAL | Status: DC
  Administered 2018-08-26 (×3): 200 mg via ORAL
  Filled 2018-08-25 (×5): qty 1

## 2018-08-25 MED ORDER — AMLODIPINE BESYLATE 5 MG PO TABS
5.0000 mg | ORAL_TABLET | Freq: Every day | ORAL | Status: DC
  Administered 2018-08-26: 5 mg via ORAL
  Filled 2018-08-25: qty 1

## 2018-08-25 MED ORDER — ACETAMINOPHEN 325 MG PO TABS
650.0000 mg | ORAL_TABLET | Freq: Four times a day (QID) | ORAL | Status: DC | PRN
  Administered 2018-08-26: 650 mg via ORAL
  Filled 2018-08-25: qty 2

## 2018-08-25 MED ORDER — ONDANSETRON HCL 4 MG/2ML IJ SOLN: 4.0000 mg | Freq: Four times a day (QID) | INTRAMUSCULAR | Status: DC | PRN

## 2018-08-25 MED ORDER — ASPIRIN EC 81 MG PO TBEC
81.0000 mg | DELAYED_RELEASE_TABLET | Freq: Every day | ORAL | Status: DC
  Administered 2018-08-26: 81 mg via ORAL
  Filled 2018-08-25: qty 1

## 2018-08-25 MED ORDER — SODIUM CHLORIDE 0.9 % IV SOLN
INTRAVENOUS | Status: AC
  Administered 2018-08-25: via INTRAVENOUS

## 2018-08-25 MED ORDER — HEPARIN SODIUM (PORCINE) 5000 UNIT/ML IJ SOLN
5000.0000 [IU] | Freq: Three times a day (TID) | INTRAMUSCULAR | Status: DC
  Administered 2018-08-25 – 2018-08-27 (×6): 5000 [IU] via SUBCUTANEOUS
  Filled 2018-08-25 (×5): qty 1

## 2018-08-25 MED ORDER — SODIUM CHLORIDE 0.9 % IV BOLUS
1000.0000 mL | Freq: Once | INTRAVENOUS | Status: AC
  Administered 2018-08-25: 1000 mL via INTRAVENOUS

## 2018-08-25 MED ORDER — METOPROLOL TARTRATE 25 MG PO TABS
25.0000 mg | ORAL_TABLET | Freq: Two times a day (BID) | ORAL | Status: DC
  Administered 2018-08-25 – 2018-08-26 (×3): 25 mg via ORAL
  Filled 2018-08-25 (×3): qty 1

## 2018-08-25 MED ORDER — ACETAMINOPHEN 650 MG RE SUPP: 650.0000 mg | Freq: Four times a day (QID) | RECTAL | Status: DC | PRN

## 2018-08-25 MED ORDER — ONDANSETRON HCL 4 MG PO TABS: 4.0000 mg | ORAL_TABLET | Freq: Four times a day (QID) | ORAL | Status: DC | PRN

## 2018-08-25 MED ORDER — INSULIN ASPART 100 UNIT/ML ~~LOC~~ SOLN: 0.0000 [IU] | Freq: Three times a day (TID) | SUBCUTANEOUS | Status: DC

## 2018-08-25 MED ORDER — AZATHIOPRINE 50 MG PO TABS
50.0000 mg | ORAL_TABLET | Freq: Two times a day (BID) | ORAL | Status: DC
  Filled 2018-08-25: qty 1

## 2018-08-25 NOTE — ED Triage Notes (Signed)
Pt here with nausea and emesis after PCP advise to come here for elevated creatine levels.  Hx of lupus and having blood drawn weekly to trend. A&Ox4, no emesis in last 4hrs.

## 2018-08-25 NOTE — ED Notes (Signed)
Transported to CT scan

## 2018-08-25 NOTE — ED Notes (Signed)
Patient receiving NS IV bolus 500 ml per PA .

## 2018-08-25 NOTE — H&P (Signed)
History and Physical    [name]:[id number] DOB: September 06, 1975 DOA: 08/25/2018  PCP: Gildardo Pounds, NP   Patient coming from: Home   Chief Complaint: N/V, abnormal outpatient labs   HPI: [name] is a 42 y.o. female with medical history significant for lupus, chronic diastolic CHF, atrial flutter status-post ablation, insulin-dependent DM, and hypertension now presenting for evaluation of abnormal outpatient labs. Patient reports that she was started on Imuran by her rheumatologist 1 month ago and has been having weekly blood work since then.  States that she was called today and notified that creatinine was elevated on blood work performed 08/22/2018, and she was advised to seek evaluation in the ED.  Patient reports some nausea with a few episodes of nonbloody vomiting over the past couple days, but denies any significant abdominal pain or diarrhea.  She denies any fevers or chills.  Reports that she had a kidney biopsy in 2003 and was told that there was evidence of lupus at that time.  ED Course: Upon arrival to the ED, patient is found to be afebrile, saturating well on room air, slightly tachycardic, and with stable blood pressure.  Chemistry panel is notable for a BUN of 86 and creatinine of 2.62, up from 0.88 in October.  CBC is notable for a microcytic anemia with hemoglobin 10.7.  No renal or ureteral stone, and no obstruction noted on CT.  Patient was given a liter of normal saline.  She remains hemodynamically stable and will be observed for further evaluation and management.  Review of Systems:  All other systems reviewed and apart from HPI, are negative.  Past Medical History:  Diagnosis Date  . Abnormal Pap smear of cervix    colpo, HPV  . Allergy   . Anemia   . CHF (congestive heart failure) ([place])   . Depression    after losses  . Enlarged heart    managed by cardiology  . Fibroid   . Gestational diabetes   . Heart murmur   . Hypertension   .  Infection    UTI  . Lupus ([place]) 1999    Past Surgical History:  Procedure Laterality Date  . A-FLUTTER ABLATION N/A 06/10/2018   Procedure: A-FLUTTER ABLATION;  Surgeon: Evans Lance, MD;  Location: [facility];  Service: Cardiovascular;  Laterality: N/A;  . RIGHT/LEFT HEART CATH AND CORONARY ANGIOGRAPHY N/A 03/14/2018   Procedure: RIGHT/LEFT HEART CATH AND CORONARY ANGIOGRAPHY;  Surgeon: [name], MD;  Location: [facility];  Service: Cardiovascular;  Laterality: N/A;  . THERAPEUTIC ABORTION       reports that she has never smoked. She has never used smokeless tobacco. She reports current alcohol use. She reports that she does not use drugs.  Allergies  Allergen Reactions  . Carrot Oil Shortness Of Breath, Itching, Rash and Other (See Comments)    Bumps on tongue, also  . Celery Oil Shortness Of Breath, Itching, Rash and Other (See Comments)    Bumps on tongue, also  . Peanut-Containing Drug Products Anaphylaxis, Shortness Of Breath, Itching, Rash and Other (See Comments)    Bumps on tongue, also  . Septra [Bactrim] Hives    Family History  Problem Relation Age of Onset  . Cancer Maternal Grandfather        lung  . Diabetes Mother   . Hypertension Maternal Grandmother   . Diabetes Father   . Hearing loss Neg Hx      Prior  to Admission medications   Medication Sig Start Date End Date Taking? Authorizing Provider  acetaminophen (TYLENOL) 325 MG tablet Take 2 tablets (650 mg total) by mouth every 4 (four) hours as needed for headache or mild pain. 12/28/17  Yes Georgette Shell, MD  amLODipine (NORVASC) 5 MG tablet Take 5 mg by mouth daily.   Yes [provider]  aspirin EC 81 MG tablet Take 81 mg by mouth daily.   Yes [provider]  azaTHIOprine (IMURAN) 50 MG tablet Take 50 mg by mouth 2 (two) times daily.   Yes [provider]  colchicine 0.6 MG tablet Take 0.6 mg by mouth 2 (two) times daily.   Yes [provider]  furosemide (LASIX) 40 MG tablet Take 1 tablet (40 mg total) by mouth 2 (two) times daily. 07/02/18  Yes Gildardo Pounds, NP  glimepiride (AMARYL) 2 MG tablet Take 1 tablet (2 mg total) by mouth every morning. 02/25/18 02/25/19 Yes Gildardo Pounds, NP  hydroxychloroquine (PLAQUENIL) 200 MG tablet Take 1 tablet (200 mg total) by mouth 2 (two) times daily. 01/18/18  Yes Rai, Ripudeep K, MD  insulin glargine (LANTUS) 100 UNIT/ML injection Inject 0.16 mLs (16 Units total) into the skin daily. Patient taking differently: Inject 16 Units into the skin daily before breakfast.  07/02/18  Yes Gildardo Pounds, NP  JARDIANCE 10 MG TABS tablet Take 10 mg by mouth daily. 07/04/18  Yes [provider]  lisinopril (PRINIVIL,ZESTRIL) 20 MG tablet TAKE 1 TABLET BY MOUTH DAILY. Patient taking differently: Take 20 mg by mouth daily.  08/19/18  Yes Gildardo Pounds, NP  metFORMIN (GLUCOPHAGE) 1000 MG tablet Take 1 tablet (1,000 mg total) by mouth 2 (two) times daily with a meal. 01/18/18  Yes Rai, Ripudeep K, MD  metoprolol tartrate (LOPRESSOR) 25 MG tablet Take 25 mg by mouth 2 (two) times daily.   Yes [provider]  omeprazole (PRILOSEC) 20 MG capsule Take 1 capsule (20 mg total) by mouth daily. Patient taking differently: Take 20 mg by mouth daily as needed (for heartburn or GERD-like symptoms).  02/25/18 08/25/18 Yes Gildardo Pounds, NP  potassium chloride (K-DUR) 10 MEQ tablet Take 1 tablet (10 mEq total) by mouth 2 (two) times daily. 05/14/18  Yes Adrian Prows, MD  rosuvastatin (CRESTOR) 20 MG tablet TAKE 1 TABLET BY MOUTH DAILY AT 6 PM. Patient taking differently: Take 20 mg by mouth daily at 6 PM.  08/22/18  Yes Gildardo Pounds, NP  apixaban (ELIQUIS) 5 MG TABS tablet Take 1 tablet (5 mg total) by mouth 2 (two) times daily. Patient not taking: Reported on 08/25/2018 05/14/18   Adrian Prows, MD  Blood Glucose Monitoring Suppl (TRUE METRIX METER) w/Device KIT Check blood sugars fasting and  at bedtime 01/30/18   Freeman Caldron M, PA-C  glucose blood (TRUE METRIX BLOOD GLUCOSE TEST) test strip Use as instructed 07/02/18   Gildardo Pounds, NP  sotalol (BETAPACE) 80 MG tablet Take 1 tablet (80 mg total) by mouth 2 (two) times daily. Patient not taking: Reported on 08/25/2018 05/14/18   Adrian Prows, MD  TRUEPLUS LANCETS 28G MISC Check blood sugars as directed 01/30/18   Argentina Donovan, [place]    Physical Exam: Vitals:   08/25/18 1846 08/25/18 1847 08/25/18 1922 08/25/18 2309  BP: 119/66  103/62 (!) 103/59  Pulse: (!) 107 (!) 116 96   Resp: 18  16 16   Temp:      TempSrc:  SpO2: 100% 100% 100% 100%    Constitutional: NAD, calm, Cushingoid  Eyes: PERTLA, lids and conjunctivae normal ENMT: Mucous membranes are moist. Posterior pharynx clear of any exudate or lesions.   Neck: normal, supple, no masses, no thyromegaly Respiratory: clear to auscultation bilaterally, no wheezing, no crackles. Normal respiratory effort.   Cardiovascular: S1 & S2 heard, regular rate and rhythm. No extremity edema.   Abdomen: No distension, no tenderness, soft. Bowel sounds active.  Musculoskeletal: no clubbing / cyanosis. No joint deformity upper and lower extremities.    Skin: no significant rashes, lesions, ulcers. Warm, dry, well-perfused. Neurologic: CN 2-12 grossly intact. Sensation intact. Strength 5/5 in all 4 limbs.  Psychiatric: Alert and oriented x 3. Normal mood and affect.    Labs on Admission: I have personally reviewed following labs and imaging studies  CBC: Recent Labs  Lab 08/25/18 1611  WBC 5.7  HGB 10.7*  HCT 33.6*  MCV 77.6*  PLT 536   Basic Metabolic Panel: Recent Labs  Lab 08/25/18 1611  NA 136  K 3.5  CL 101  CO2 24  GLUCOSE 73  BUN 86*  CREATININE 2.62*  CALCIUM 9.1   GFR: CrCl cannot be calculated (Unknown ideal weight.). Liver Function Tests: Recent Labs  Lab 08/25/18 1611  AST 19  ALT 10  ALKPHOS 45  BILITOT 0.6  PROT 8.5*  ALBUMIN 3.9    Recent Labs  Lab 08/25/18 1611  LIPASE 92*   No results for input(s): AMMONIA in the last 168 hours. Coagulation Profile: No results for input(s): INR, PROTIME in the last 168 hours. Cardiac Enzymes: No results for input(s): CKTOTAL, CKMB, CKMBINDEX, TROPONINI in the last 168 hours. BNP (last 3 results) No results for input(s): PROBNP in the last 8760 hours. HbA1C: No results for input(s): HGBA1C in the last 72 hours. CBG: No results for input(s): GLUCAP in the last 168 hours. Lipid Profile: No results for input(s): CHOL, HDL, LDLCALC, TRIG, CHOLHDL, LDLDIRECT in the last 72 hours. Thyroid Function Tests: No results for input(s): TSH, T4TOTAL, FREET4, T3FREE, THYROIDAB in the last 72 hours. Anemia Panel: No results for input(s): VITAMINB12, FOLATE, FERRITIN, TIBC, IRON, RETICCTPCT in the last 72 hours. Urine analysis:    Component Value Date/Time   COLORURINE YELLOW 08/25/2018 1602   APPEARANCEUR HAZY (A) 08/25/2018 1602   LABSPEC 1.011 08/25/2018 1602   PHURINE 6.0 08/25/2018 1602   GLUCOSEU NEGATIVE 08/25/2018 1602   HGBUR SMALL (A) 08/25/2018 1602   BILIRUBINUR NEGATIVE 08/25/2018 1602   KETONESUR NEGATIVE 08/25/2018 1602   PROTEINUR 30 (A) 08/25/2018 1602   UROBILINOGEN 1.0 07/19/2017 1317   NITRITE NEGATIVE 08/25/2018 1602   LEUKOCYTESUR MODERATE (A) 08/25/2018 1602   Sepsis Labs: @LABRCNTIP (procalcitonin:4,lacticidven:4) )No results found for this or any previous visit (from the past 240 hour(s)).   Radiological Exams on Admission: Ct Renal Stone Study  Result Date: 08/25/2018 CLINICAL DATA:  Left flank pain for 2 days. EXAM: CT ABDOMEN AND PELVIS WITHOUT CONTRAST TECHNIQUE: Multidetector CT imaging of the abdomen and pelvis was performed following the standard protocol without IV contrast. COMPARISON:  None. FINDINGS: Lower chest: Infiltration or atelectasis in both lung bases, greater on the right. Mild cardiac enlargement. Small pericardial effusion.  Hepatobiliary: No focal liver abnormality is seen. No gallstones, gallbladder wall thickening, or biliary dilatation. Pancreas: Unremarkable. No pancreatic ductal dilatation or surrounding inflammatory changes. Spleen: Normal in size without focal abnormality. Adrenals/Urinary Tract: Adrenal glands are unremarkable. Kidneys are normal, without renal calculi, focal lesion, or hydronephrosis. Bladder  is unremarkable. Stomach/Bowel: Stomach is within normal limits. Appendix appears normal. No evidence of bowel wall thickening, distention, or inflammatory changes. Vascular/Lymphatic: Aortic atherosclerosis. No enlarged abdominal or pelvic lymph nodes. Reproductive: Nodular enlargement of the uterus with multiple calcifications consistent with calcified fibroids. No abnormal adnexal masses. Other: No abdominal wall hernia or abnormality. No abdominopelvic ascites. Musculoskeletal: No acute or significant osseous findings. IMPRESSION: 1. Infiltration or atelectasis seen in both lung bases. 2. Mild cardiac enlargement with small pericardial effusion. 3. No renal or ureteral stone or obstruction. 4. Multiple calcified uterine fibroids. 5. Aortic atherosclerosis. Electronically Signed   By: Lucienne Capers M.D.   On: 08/25/2018 21:24    EKG: Not performed.   Assessment/Plan  1. Acute kidney injury  - SCr is 2.62 on admission, previously normal  - She reports some N/V for past couple days, but had outpatient labs on 08/22/18 with elevated creatinine per patient's report  - She had kidney biopsy ~2003 and reports had "a little lupus in kidneys" then  - No obstruction on CT in ED  - Small Hgb and protein noted on UA  - Given a liter NS in ED  - Check urine chemistries, anti-DNA, C3, and C4  - Hold Lasix and lisinopril, continue IVF hydration, repeat chem panel in am   2. Lupus  - Follow with rheumatology, managed with Plaquenil and Imuran  - Continue current medications   3. Atrial flutter  - In sinus  rhythm on admission; no palpitations since ablation   - CHADS-VASc is 4 (gender, CHF, DM, HTN)  - Recently taken off of Eliquis by her cardiologist  - Continue beta-blocker and ASA    4. Chronic diastolic CHF  - Appears compensated  - Lasix and lisinopril held in light of AKI, and providing gentle IVF hydration for same   - Follow daily wt, continue beta-blocker as tolerated    5. Insulin-dependent DM  - A1c was 9.0% in November  - Managed at home with Jardiance, glimepiride, and Lantus 16 units qD  - Check CBG's and use a Novolog per sliding-scale to start    6. Hypertension  - BP at goal  - Continue Norvasc and Lopressor as tolerated    DVT prophylaxis: sq heparin  Code Status: Full  Family Communication: Discussed with patient  Consults called: None Admission status: Observation    Vianne Bulls, MD [facility] Pager [phone]  If 7PM-7AM, please contact night-coverage [email or web address] Password [facility]  08/25/2018, 11:18 PM

## 2018-08-25 NOTE — ED Notes (Signed)
Pt ambulated to restroom with steady gait.

## 2018-08-26 ENCOUNTER — Encounter (HOSPITAL_COMMUNITY): Payer: Self-pay

## 2018-08-26 LAB — BASIC METABOLIC PANEL
Anion gap: 10 (ref 5–15)
Anion gap: 9 (ref 5–15)
BUN: 70 mg/dL — ABNORMAL HIGH (ref 6–20)
BUN: 57 mg/dL — ABNORMAL HIGH (ref 6–20)
CO2: 20 mmol/L — ABNORMAL LOW (ref 22–32)
CO2: 24 mmol/L (ref 22–32)
Calcium: 8.6 mg/dL — ABNORMAL LOW (ref 8.9–10.3)
Calcium: 8.9 mg/dL (ref 8.9–10.3)
Chloride: 106 mmol/L (ref 98–111)
Chloride: 107 mmol/L (ref 98–111)
Creatinine, Ser: 2.07 mg/dL — ABNORMAL HIGH (ref 0.44–1.00)
Creatinine, Ser: 1.8 mg/dL — ABNORMAL HIGH (ref 0.44–1.00)
GFR calc Af Amer: 33 mL/min — ABNORMAL LOW (ref >60)
GFR calc Af Amer: 40 mL/min — ABNORMAL LOW (ref >60)
GFR calc non Af Amer: 29 mL/min — ABNORMAL LOW (ref >60)
GFR calc non Af Amer: 34 mL/min — ABNORMAL LOW (ref >60)
Glucose, Bld: 173 mg/dL — ABNORMAL HIGH (ref 70–99)
Glucose, Bld: 149 mg/dL — ABNORMAL HIGH (ref 70–99)
Potassium: 3.1 mmol/L — ABNORMAL LOW (ref 3.5–5.1)
Potassium: 3.6 mmol/L (ref 3.5–5.1)
Sodium: 136 mmol/L (ref 135–145)
Sodium: 140 mmol/L (ref 135–145)

## 2018-08-26 LAB — GLUCOSE, RANDOM: Glucose, Bld: 212 mg/dL — ABNORMAL HIGH (ref 70–99)

## 2018-08-26 LAB — GLUCOSE, CAPILLARY
Glucose-Capillary: 77 mg/dL (ref 70–99)
Glucose-Capillary: 100 mg/dL — ABNORMAL HIGH (ref 70–99)
Glucose-Capillary: 47 mg/dL — ABNORMAL LOW (ref 70–99)
Glucose-Capillary: 132 mg/dL — ABNORMAL HIGH (ref 70–99)
Glucose-Capillary: 34 mg/dL — CL (ref 70–99)
Glucose-Capillary: 74 mg/dL (ref 70–99)
Glucose-Capillary: 113 mg/dL — ABNORMAL HIGH (ref 70–99)
Glucose-Capillary: 72 mg/dL (ref 70–99)

## 2018-08-26 LAB — CBC WITH DIFFERENTIAL/PLATELET
Abs Immature Granulocytes: 0.01 10*3/uL (ref 0.00–0.07)
Basophils Absolute: 0 10*3/uL (ref 0.0–0.1)
Basophils Relative: 1 %
Eosinophils Absolute: 0.2 10*3/uL (ref 0.0–0.5)
Eosinophils Relative: 4 %
HCT: 31.5 % — ABNORMAL LOW (ref 36.0–46.0)
Hemoglobin: 10 g/dL — ABNORMAL LOW (ref 12.0–15.0)
Immature Granulocytes: 0 %
Lymphocytes Relative: 25 %
Lymphs Abs: 1.1 10*3/uL (ref 0.7–4.0)
MCH: 24.4 pg — ABNORMAL LOW (ref 26.0–34.0)
MCHC: 31.7 g/dL (ref 30.0–36.0)
MCV: 76.8 fL — ABNORMAL LOW (ref 80.0–100.0)
Monocytes Absolute: 1.1 10*3/uL — ABNORMAL HIGH (ref 0.1–1.0)
Monocytes Relative: 26 %
Neutro Abs: 1.9 10*3/uL (ref 1.7–7.7)
Neutrophils Relative %: 44 %
Platelets: 293 10*3/uL (ref 150–400)
RBC: 4.1 MIL/uL (ref 3.87–5.11)
RDW: 13.5 % (ref 11.5–15.5)
WBC: 4.2 10*3/uL (ref 4.0–10.5)
nRBC: 0 % (ref 0.0–0.2)

## 2018-08-26 LAB — HIV ANTIBODY (ROUTINE TESTING W REFLEX): HIV Screen 4th Generation wRfx: NONREACTIVE

## 2018-08-26 LAB — SODIUM, URINE, RANDOM: Sodium, Ur: 67 mmol/L

## 2018-08-26 LAB — CREATININE, URINE, RANDOM: Creatinine, Urine: 58 mg/dL

## 2018-08-26 MED ORDER — COLCHICINE 0.6 MG PO TABS
0.3000 mg | ORAL_TABLET | Freq: Two times a day (BID) | ORAL | Status: DC
  Administered 2018-08-26 (×2): 0.3 mg via ORAL
  Filled 2018-08-26 (×4): qty 0.5

## 2018-08-26 MED ORDER — BENZONATATE 100 MG PO CAPS
200.0000 mg | ORAL_CAPSULE | Freq: Three times a day (TID) | ORAL | Status: DC | PRN
  Administered 2018-08-26: 200 mg via ORAL
  Filled 2018-08-26: qty 2

## 2018-08-26 MED ORDER — AZATHIOPRINE 50 MG PO TABS
25.0000 mg | ORAL_TABLET | Freq: Three times a day (TID) | ORAL | Status: DC
  Administered 2018-08-26 (×3): 25 mg via ORAL
  Filled 2018-08-26 (×6): qty 1

## 2018-08-26 MED ORDER — DEXTROSE 50 % IV SOLN
INTRAVENOUS | Status: AC
  Filled 2018-08-26: qty 50

## 2018-08-26 NOTE — Progress Notes (Signed)
PROGRESS NOTE    [name]  WUJ:[id number] DOB: [birth year] DOA: 08/25/2018 PCP: Gildardo Pounds, NP   Brief Narrative:  HPI on 08/25/2018 by Dr. Mitzi Hansen [name] is a 43 y.o. female with medical history significant for lupus, chronic diastolic CHF, atrial flutter status-post ablation, insulin-dependent DM, and hypertension now presenting for evaluation of abnormal outpatient labs. Patient reports that she was started on Imuran by her rheumatologist 1 month ago and has been having weekly blood work since then.  States that she was called today and notified that creatinine was elevated on blood work performed 08/22/2018, and she was advised to seek evaluation in the ED.  Patient reports some nausea with a few episodes of nonbloody vomiting over the past couple days, but denies any significant abdominal pain or diarrhea.  She denies any fevers or chills.  Reports that she had a kidney biopsy in 2003 and was told that there was evidence of lupus at that time.  Interim history Patient was sent to the hospital for abnormal lab work including an elevated creatinine.  She was also noted to have nausea with vomiting in the last several days.  Patient admitted for acute kidney injury which is improving with IV fluids however patient continues to have episodes of vomiting. Assessment & Plan   Acute kidney injury -Creatinine on admission was 2.62, baseline appears to be 0.8-0.9 -Patient reported some nausea and vomiting last several days.  She was also taking Lasix as well as lisinopril -Suspect acute kidney injury due to dehydration -Patient had renal biopsy in 2003 and reported lupus in the kidneys at that time -CT renal stone study: Infiltration or atelectasis in both lung bases.  Mild cardiac enlargement with small pericardial effusion.  No renal or ureteral stone or obstruction.  Multiple calcified uterine fibroids. -Creatinine now 1.8 -Continue IV fluids -Continue to monitor  BMP  Lupus -Patient follows with [facility] rheumatology and is managed with Imuran and Plaquenil  Atrial flutter -Currently in sinus rhythm -Status post ablation back in 2019 -Eliquis was recently discontinued by her cardiologist -Continue aspirin and metoprolol  Chronic diastolic heart failure -Currently appears to be compensated -Echocardiogram 03/13/2018 shows an EF 55 to 95%, grade 1 diastolic dysfunction -Given that patient will be receiving IV fluids, will continue to monitor very closely -Monitor intake and output, daily weights  Diabetes mellitus, type II -Managed at home with Jardiance, glimepiride, Lantus -Continue insulin sliding scale and CBG monitoring -Last A1c was 9 in November 2019  Essential hypertension -Continue amlodipine, metoprolol  Nausea/vomiting -Possibly secondary to gastritis or gastroenteritis -Currently afebrile, no leukocytosis -CT as above -Continue antiemetics as needed  DVT Prophylaxis  heparin  Code Status: Full  Family Communication: None at bedside  Disposition Plan: Admitted.  Given patient has a history of lupus as well as heart failure, will continue IV fluids as well as monitoring of her I's and O's.  Patient continues to have some vomiting, concern for ability to adequately intake fluids at home.  Consultants none  Procedures  none  Antibiotics   Anti-infectives (From admission, onward)   Start     Dose/Rate Route Frequency Ordered Stop   08/25/18 2330  hydroxychloroquine (PLAQUENIL) tablet 200 mg     200 mg Oral 2 times daily 08/25/18 2317        Subjective:   [name] seen and examined today.  Patient denies current abdominal pain, diarrhea or constipation.  Denies current chest pain, shortness of breath, dizziness or  headache.  Does have cough occasionally.  Has had some vomiting today.  Objective:   Vitals:   08/26/18 0222 08/26/18 0617 08/26/18 0950 08/26/18 1400  BP:  (!) 96/58 111/64 108/62  Pulse:  92  91 90  Resp:  20    Temp:  97.8 F (36.6 C) 98.2 F (36.8 C) 98.4 F (36.9 C)  TempSrc:  Oral Oral Oral  SpO2:  99% 98% 100%  Weight: 107.8 kg     Height: 5\' 5"  (1.651 m)       Intake/Output Summary (Last 24 hours) at 08/26/2018 1512 Last data filed at 08/26/2018 1100 Gross per 24 hour  Intake 1958.86 ml  Output 1800 ml  Net 158.86 ml   Filed Weights   08/26/18 0222  Weight: 107.8 kg    Exam  General: Well developed, well nourished, NAD, appears stated age  9: NCAT, mucous membranes moist.   Neck: Supple  Cardiovascular: S1 S2 auscultated, RRR, no murmur  Respiratory: Clear to auscultation bilaterally with equal chest rise  Abdomen: Soft, obese, nontender, nondistended, + bowel sounds  Extremities: warm dry without cyanosis clubbing or edema  Neuro: AAOx3, nonfocal  Psych: Normal affect and demeanor, pleasant     Data Reviewed: I have personally reviewed following labs and imaging studies  CBC: Recent Labs  Lab 08/25/18 1611 08/26/18 0328  WBC 5.7 4.2  NEUTROABS  --  1.9  HGB 10.7* 10.0*  HCT 33.6* 31.5*  MCV 77.6* 76.8*  PLT 332 462   Basic Metabolic Panel: Recent Labs  Lab 08/25/18 1611 08/26/18 0328 08/26/18 1327  NA 136 136 140  K 3.5 3.1* 3.6  CL 101 106 107  CO2 24 20* 24  GLUCOSE 73 173* 149*  BUN 86* 70* 57*  CREATININE 2.62* 2.07* 1.80*  CALCIUM 9.1 8.6* 8.9   GFR: Estimated Creatinine Clearance: 49.7 mL/min (A) (by C-G formula based on SCr of 1.8 mg/dL (H)). Liver Function Tests: Recent Labs  Lab 08/25/18 1611  AST 19  ALT 10  ALKPHOS 45  BILITOT 0.6  PROT 8.5*  ALBUMIN 3.9   Recent Labs  Lab 08/25/18 1611  LIPASE 92*   No results for input(s): AMMONIA in the last 168 hours. Coagulation Profile: No results for input(s): INR, PROTIME in the last 168 hours. Cardiac Enzymes: No results for input(s): CKTOTAL, CKMB, CKMBINDEX, TROPONINI in the last 168 hours. BNP (last 3 results) No results for input(s): PROBNP  in the last 8760 hours. HbA1C: No results for input(s): HGBA1C in the last 72 hours. CBG: Recent Labs  Lab 08/25/18 2352 08/26/18 0804 08/26/18 1209  GLUCAP 78 77 100*   Lipid Profile: No results for input(s): CHOL, HDL, LDLCALC, TRIG, CHOLHDL, LDLDIRECT in the last 72 hours. Thyroid Function Tests: No results for input(s): TSH, T4TOTAL, FREET4, T3FREE, THYROIDAB in the last 72 hours. Anemia Panel: No results for input(s): VITAMINB12, FOLATE, FERRITIN, TIBC, IRON, RETICCTPCT in the last 72 hours. Urine analysis:    Component Value Date/Time   COLORURINE YELLOW 08/25/2018 1602   APPEARANCEUR HAZY (A) 08/25/2018 1602   LABSPEC 1.011 08/25/2018 1602   PHURINE 6.0 08/25/2018 1602   GLUCOSEU NEGATIVE 08/25/2018 1602   HGBUR SMALL (A) 08/25/2018 1602   BILIRUBINUR NEGATIVE 08/25/2018 1602   KETONESUR NEGATIVE 08/25/2018 1602   PROTEINUR 30 (A) 08/25/2018 1602   UROBILINOGEN 1.0 07/19/2017 1317   NITRITE NEGATIVE 08/25/2018 1602   LEUKOCYTESUR MODERATE (A) 08/25/2018 1602   Sepsis Labs: @LABRCNTIP (procalcitonin:4,lacticidven:4)  )No results found for this or  any previous visit (from the past 240 hour(s)).    Radiology Studies: Ct Renal Stone Study  Result Date: 08/25/2018 CLINICAL DATA:  Left flank pain for 2 days. EXAM: CT ABDOMEN AND PELVIS WITHOUT CONTRAST TECHNIQUE: Multidetector CT imaging of the abdomen and pelvis was performed following the standard protocol without IV contrast. COMPARISON:  None. FINDINGS: Lower chest: Infiltration or atelectasis in both lung bases, greater on the right. Mild cardiac enlargement. Small pericardial effusion. Hepatobiliary: No focal liver abnormality is seen. No gallstones, gallbladder wall thickening, or biliary dilatation. Pancreas: Unremarkable. No pancreatic ductal dilatation or surrounding inflammatory changes. Spleen: Normal in size without focal abnormality. Adrenals/Urinary Tract: Adrenal glands are unremarkable. Kidneys are normal,  without renal calculi, focal lesion, or hydronephrosis. Bladder is unremarkable. Stomach/Bowel: Stomach is within normal limits. Appendix appears normal. No evidence of bowel wall thickening, distention, or inflammatory changes. Vascular/Lymphatic: Aortic atherosclerosis. No enlarged abdominal or pelvic lymph nodes. Reproductive: Nodular enlargement of the uterus with multiple calcifications consistent with calcified fibroids. No abnormal adnexal masses. Other: No abdominal wall hernia or abnormality. No abdominopelvic ascites. Musculoskeletal: No acute or significant osseous findings. IMPRESSION: 1. Infiltration or atelectasis seen in both lung bases. 2. Mild cardiac enlargement with small pericardial effusion. 3. No renal or ureteral stone or obstruction. 4. Multiple calcified uterine fibroids. 5. Aortic atherosclerosis. Electronically Signed   By: Lucienne Capers M.D.   On: 08/25/2018 21:24     Scheduled Meds: . amLODipine  5 mg Oral Daily  . aspirin EC  81 mg Oral Daily  . azaTHIOprine  25 mg Oral TID  . colchicine  0.3 mg Oral BID  . heparin  5,000 Units Subcutaneous Q8H  . hydroxychloroquine  200 mg Oral BID  . insulin aspart  0-5 Units Subcutaneous QHS  . insulin aspart  0-9 Units Subcutaneous TID WC  . metoprolol tartrate  25 mg Oral BID  . rosuvastatin  20 mg Oral q1800   Continuous Infusions:   LOS: 0 days   Time Spent in minutes   30 minutes  Zahava Quant D.O. on 08/26/2018 at 3:12 PM  Between 7am to 7pm - Please see pager noted on [email or web address]  After 7pm go to [email or web address]  And look for the night coverage person covering for me after hours  [facility] Office  [phone]

## 2018-08-26 NOTE — Progress Notes (Signed)
Hypoglycemic Event  CBG: 34  Treatment:juices  Symptoms: none Follow-up CBG: Time:see results times CBG Result:132  Possible Reasons for Event: Discussed with Diabetic Coordinator and Dr. Ree Kida Comments/MD notified:Dr. Kathlen Mody, Evalyn Casco

## 2018-08-26 NOTE — Discharge Summary (Addendum)
Physician Discharge Summary  CHANDELL ATTRIDGE [facility]:[id number] DOB: 1975-10-04 DOA: 08/25/2018  PCP: Gildardo Pounds, NP  Admit date: 08/25/2018 Discharge date: 08/27/2018  Time spent: 45 minutes  Recommendations for Outpatient Follow-up:  Patient will be discharged to home.  Patient will need to follow up with primary care provider within one week of discharge, repeat BMP.  Patient should continue medications as prescribed.  Patient should follow a heart healthy/carb modified diet.   Discharge Diagnoses:  Principle problem: Acute kidney injury Lupus Atrial flutter Chronic diastolic heart failure Diabetes mellitus, type II Essential hypertension Nausea/vomiting  Discharge Condition: Stable  Diet recommendation: heart healthy/carb modified   Filed Weights   08/26/18 0222 08/27/18 0500  Weight: 107.8 kg 107.9 kg    History of present illness:  on 08/25/2018 by Dr. Erlene Quan L Crawfordis a 43 y.o.femalewith medical history significant forlupus, chronic diastolic CHF, atrial flutter status-post ablation, insulin-dependent DM, and hypertension now presenting for evaluation of abnormal outpatient labs. Patient reports that she was started on Imuran by her rheumatologist 1 month ago and has been having weekly blood work since then.States that she was called today and notified that creatinine was elevated on blood work performed 08/22/2018, and she was advised to seek evaluation in the ED. Patient reports some nausea with a few episodes of nonbloody vomiting over the past couple days, but denies any significant abdominal pain or diarrhea. She denies any fevers or chills. Reports that she had a kidney biopsy in 2003 and was told that there was evidence of lupus at that time.  Hospital Course:  Acute kidney injury -Creatinine on admission was 2.62, baseline appears to be 0.8-0.9 -Patient reported some nausea and vomiting last several days.  She was also taking Lasix as well  as lisinopril -Suspect acute kidney injury due to dehydration -Patient had renal biopsy in 2003 and reported lupus in the kidneys at that time -CT renal stone study: Infiltration or atelectasis in both lung bases.  Mild cardiac enlargement with small pericardial effusion.  No renal or ureteral stone or obstruction.  Multiple calcified uterine fibroids. -Creatinine now 1.43 -placed on IV fluids -Repeat BMP in one week  Lupus -Patient follows with [facility] rheumatology and is managed with Imuran and Plaquenil  Atrial flutter -Currently in sinus rhythm -Status post ablation back in 2019 -Eliquis was recently discontinued by her cardiologist -Continue aspirin and metoprolol  Chronic diastolic heart failure -Currently appears to be compensated -Echocardiogram 03/13/2018 shows an EF 55 to 67%, grade 1 diastolic dysfunction -Monitor intake and output, daily weights  Diabetes mellitus, type II -Managed at home with Jardiance, glimepiride, Lantus, metformin -Last A1c was 9 in November 2019 -patient with episodes of hypoglycemia- discussed holding amaryl, Jardiance, decreasing Lantus dose and continuing metformin with close follow-up with PCP  Essential hypertension -Continue amlodipine, metoprolol  Nausea/vomiting -Possibly secondary to gastritis or gastroenteritis -Currently afebrile, no leukocytosis -CT as above -Continue antiemetics as needed  Procedures: None  Consultations: None  Discharge Exam: Vitals:   08/26/18 2113 08/27/18 0600  BP: 117/71 110/67  Pulse: 94 (!) 104  Resp: 18 20  Temp: (!) 97.4 F (36.3 C) 98.5 F (36.9 C)  SpO2: 100% 98%     General: Well developed, well nourished, NAD, appears stated age  HEENT: NCAT, mucous membranes moist.  Neck: Supple  Cardiovascular: S1 S2 auscultated, no rubs, murmurs or gallops. Regular rate and rhythm.  Respiratory: Clear to auscultation bilaterally with equal chest rise  Abdomen: Soft, obese,  nontender, nondistended, +  bowel sounds  Extremities: warm dry without cyanosis clubbing or edema  Neuro: AAOx3, nonfocal  Psych: Pleasant, appropriate mood and affect  Discharge Instructions Discharge Instructions    Discharge instructions   Complete by:  As directed    Patient will be discharged to home.  Patient will need to follow up with primary care provider within one week of discharge, repeat BMP.  Patient should continue medications as prescribed.  Patient should follow a heart healthy/carb modified diet.  Hold lasix and lisinopril until repeat labs by your primary care physician. Hold Jardiance and glimepiride and until seen by your PCP and discuss diabetes management. Continue metformin and take only 8units of Lantus.     Allergies as of 08/27/2018      Reactions   Carrot Oil Shortness Of Breath, Itching, Rash, Other (See Comments)   Bumps on tongue, also   Celery Oil Shortness Of Breath, Itching, Rash, Other (See Comments)   Bumps on tongue, also   Peanut-containing Drug Products Anaphylaxis, Shortness Of Breath, Itching, Rash, Other (See Comments)   Bumps on tongue, also   Septra [bactrim] Hives      Medication List    STOP taking these medications   apixaban 5 MG Tabs tablet Commonly known as:  ELIQUIS   sotalol 80 MG tablet Commonly known as:  BETAPACE     TAKE these medications   acetaminophen 325 MG tablet Commonly known as:  TYLENOL Take 2 tablets (650 mg total) by mouth every 4 (four) hours as needed for headache or mild pain.   amLODipine 5 MG tablet Commonly known as:  NORVASC Take 5 mg by mouth daily.   aspirin EC 81 MG tablet Take 81 mg by mouth daily.   azaTHIOprine 50 MG tablet Commonly known as:  IMURAN Take 50 mg by mouth 2 (two) times daily.   benzonatate 200 MG capsule Commonly known as:  TESSALON Take 1 capsule (200 mg total) by mouth 3 (three) times daily as needed for cough.   colchicine 0.6 MG tablet Take 0.6 mg by mouth 2  (two) times daily.   furosemide 40 MG tablet Commonly known as:  LASIX Take 1 tablet (40 mg total) by mouth 2 (two) times daily.   glimepiride 2 MG tablet Commonly known as:  AMARYL Take 1 tablet (2 mg total) by mouth every morning.   glucose blood test strip Commonly known as:  TRUE METRIX BLOOD GLUCOSE TEST Use as instructed   hydroxychloroquine 200 MG tablet Commonly known as:  PLAQUENIL Take 1 tablet (200 mg total) by mouth 2 (two) times daily.   insulin glargine 100 UNIT/ML injection Commonly known as:  LANTUS Inject 0.16 mLs (16 Units total) into the skin daily. What changed:  when to take this   JARDIANCE 10 MG Tabs tablet Generic drug:  empagliflozin Take 10 mg by mouth daily.   lisinopril 20 MG tablet Commonly known as:  PRINIVIL,ZESTRIL TAKE 1 TABLET BY MOUTH DAILY.   metFORMIN 1000 MG tablet Commonly known as:  GLUCOPHAGE Take 1 tablet (1,000 mg total) by mouth 2 (two) times daily with a meal.   metoprolol tartrate 25 MG tablet Commonly known as:  LOPRESSOR Take 25 mg by mouth 2 (two) times daily.   omeprazole 20 MG capsule Commonly known as:  PRILOSEC Take 1 capsule (20 mg total) by mouth daily. What changed:    when to take this  reasons to take this   potassium chloride 10 MEQ tablet Commonly known as:  K-DUR  Take 1 tablet (10 mEq total) by mouth 2 (two) times daily.   rosuvastatin 20 MG tablet Commonly known as:  CRESTOR TAKE 1 TABLET BY MOUTH DAILY AT 6 PM. What changed:  See the new instructions.   TRUE METRIX METER w/Device Kit Check blood sugars fasting and at bedtime   TRUEPLUS LANCETS 28G Misc Check blood sugars as directed      Allergies  Allergen Reactions  . Carrot Oil Shortness Of Breath, Itching, Rash and Other (See Comments)    Bumps on tongue, also  . Celery Oil Shortness Of Breath, Itching, Rash and Other (See Comments)    Bumps on tongue, also  . Peanut-Containing Drug Products Anaphylaxis, Shortness Of Breath,  Itching, Rash and Other (See Comments)    Bumps on tongue, also  . Septra [Bactrim] Hives   Follow-up Information    Gildardo Pounds, NP. Schedule an appointment as soon as possible for a visit in 1 week(s).   Specialty:  Nurse Practitioner Why:  Hospital follow up Contact information: [address] [place] [address] [phone]            The results of significant diagnostics from this hospitalization (including imaging, microbiology, ancillary and laboratory) are listed below for reference.    Significant Diagnostic Studies: Ct Renal Stone Study  Result Date: 08/25/2018 CLINICAL DATA:  Left flank pain for 2 days. EXAM: CT ABDOMEN AND PELVIS WITHOUT CONTRAST TECHNIQUE: Multidetector CT imaging of the abdomen and pelvis was performed following the standard protocol without IV contrast. COMPARISON:  None. FINDINGS: Lower chest: Infiltration or atelectasis in both lung bases, greater on the right. Mild cardiac enlargement. Small pericardial effusion. Hepatobiliary: No focal liver abnormality is seen. No gallstones, gallbladder wall thickening, or biliary dilatation. Pancreas: Unremarkable. No pancreatic ductal dilatation or surrounding inflammatory changes. Spleen: Normal in size without focal abnormality. Adrenals/Urinary Tract: Adrenal glands are unremarkable. Kidneys are normal, without renal calculi, focal lesion, or hydronephrosis. Bladder is unremarkable. Stomach/Bowel: Stomach is within normal limits. Appendix appears normal. No evidence of bowel wall thickening, distention, or inflammatory changes. Vascular/Lymphatic: Aortic atherosclerosis. No enlarged abdominal or pelvic lymph nodes. Reproductive: Nodular enlargement of the uterus with multiple calcifications consistent with calcified fibroids. No abnormal adnexal masses. Other: No abdominal wall hernia or abnormality. No abdominopelvic ascites. Musculoskeletal: No acute or significant osseous findings. IMPRESSION: 1.  Infiltration or atelectasis seen in both lung bases. 2. Mild cardiac enlargement with small pericardial effusion. 3. No renal or ureteral stone or obstruction. 4. Multiple calcified uterine fibroids. 5. Aortic atherosclerosis. Electronically Signed   By: Lucienne Capers M.D.   On: 08/25/2018 21:24    Microbiology: No results found for this or any previous visit (from the past 240 hour(s)).   Labs: Basic Metabolic Panel: Recent Labs  Lab 08/25/18 1611 08/26/18 0328 08/26/18 1327 08/26/18 1824 08/27/18 0223  NA 136 136 140  --  139  K 3.5 3.1* 3.6  --  3.6  CL 101 106 107  --  104  CO2 24 20* 24  --  23  GLUCOSE 73 173* 149* 212* 98  BUN 86* 70* 57*  --  46*  CREATININE 2.62* 2.07* 1.80*  --  1.43*  CALCIUM 9.1 8.6* 8.9  --  9.1   Liver Function Tests: Recent Labs  Lab 08/25/18 1611  AST 19  ALT 10  ALKPHOS 45  BILITOT 0.6  PROT 8.5*  ALBUMIN 3.9   Recent Labs  Lab 08/25/18 1611  LIPASE 92*  No results for input(s): AMMONIA in the last 168 hours. CBC: Recent Labs  Lab 08/25/18 1611 08/26/18 0328  WBC 5.7 4.2  NEUTROABS  --  1.9  HGB 10.7* 10.0*  HCT 33.6* 31.5*  MCV 77.6* 76.8*  PLT 332 293   Cardiac Enzymes: No results for input(s): CKTOTAL, CKMB, CKMBINDEX, TROPONINI in the last 168 hours. BNP: BNP (last 3 results) Recent Labs    03/12/18 2155 03/13/18 0218 05/12/18 1002  BNP 180.7* 229.2* 302.7*    ProBNP (last 3 results) No results for input(s): PROBNP in the last 8760 hours.  CBG: Recent Labs  Lab 08/26/18 2115 08/26/18 2138 08/26/18 2158 08/27/18 0013 08/27/18 0826  GLUCAP 74 72 113* 94 83       Signed:  [facility] 08/27/2018, 9:45 AM

## 2018-08-26 NOTE — Discharge Instructions (Signed)
Acute Kidney Injury, Adult ° °Acute kidney injury is a sudden worsening of kidney function. The kidneys are organs that have several jobs. They filter the blood to remove waste products and extra fluid. They also maintain a healthy balance of minerals and hormones in the body, which helps control blood pressure and keep bones strong. With this condition, your kidneys do not do their jobs as well as they should. °This condition ranges from mild to severe. Over time it may develop into long-lasting (chronic) kidney disease. Early detection and treatment may prevent acute kidney injury from developing into a chronic condition. °What are the causes? °Common causes of this condition include: °· A problem with blood flow to the kidneys. This may be caused by: °? Low blood pressure (hypotension) or shock. °? Blood loss. °? Heart and blood vessel (cardiovascular) disease. °? Severe burns. °? Liver disease. °· Direct damage to the kidneys. This may be caused by: °? Certain medicines. °? A kidney infection. °? Poisoning. °? Being around or in contact with toxic substances. °? A surgical wound. °? A hard, direct hit to the kidney area. °· A sudden blockage of urine flow. This may be caused by: °? Cancer. °? Kidney stones. °? An enlarged prostate in males. °What are the signs or symptoms? °Symptoms of this condition may not be obvious until the condition becomes severe. Symptoms of this condition can include: °· Tiredness (lethargy), or difficulty staying awake. °· Nausea or vomiting. °· Swelling (edema) of the face, legs, ankles, or feet. °· Problems with urination, such as: °? Abdominal pain, or pain along the side of your stomach (flank). °? Decreased urine production. °? Decrease in the force of urine flow. °· Muscle twitches and cramps, especially in the legs. °· Confusion or trouble concentrating. °· Loss of appetite. °· Fever. °How is this diagnosed? °This condition may be diagnosed with tests, including: °· Blood  tests. °· Urine tests. °· Imaging tests. °· A test in which a sample of tissue is removed from the kidneys to be examined under a microscope (kidney biopsy). °How is this treated? °Treatment for this condition depends on the cause and how severe the condition is. In mild cases, treatment may not be needed. The kidneys may heal on their own. In more severe cases, treatment will involve: °· Treating the cause of the kidney injury. This may involve changing any medicines you are taking or adjusting your dosage. °· Fluids. You may need specialized IV fluids to balance your body's needs. °· Having a catheter placed to drain urine and prevent blockages. °· Preventing problems from occurring. This may mean avoiding certain medicines or procedures that can cause further injury to the kidneys. °In some cases treatment may also require: °· A procedure to remove toxic wastes from the body (dialysis or continuous renal replacement therapy - CRRT). °· Surgery. This may be done to repair a torn kidney, or to remove the blockage from the urinary system. °Follow these instructions at home: °Medicines °· Take over-the-counter and prescription medicines only as told by your health care provider. °· Do not take any new medicines without your health care provider's approval. Many medicines can worsen your kidney damage. °· Do not take any vitamin and mineral supplements without your health care provider's approval. Many nutritional supplements can worsen your kidney damage. °Lifestyle °· If your health care provider prescribed changes to your diet, follow them. You may need to decrease the amount of protein you eat. °· Achieve and maintain a healthy   weight. If you need help with this, ask your health care provider. °· Start or continue an exercise plan. Try to exercise at least 30 minutes a day, 5 days a week. °· Do not use any tobacco products, such as cigarettes, chewing tobacco, and e-cigarettes. If you need help quitting, ask your  health care provider. °General instructions °· Keep track of your blood pressure. Report changes in your blood pressure as told by your health care provider. °· Stay up to date with immunizations. Ask your health care provider which immunizations you need. °· Keep all follow-up visits as told by your health care provider. This is important. °Where to find more information °· American Association of Kidney Patients: www.aakp.org °· National Kidney Foundation: www.kidney.org °· American Kidney Fund: www.akfinc.org °· Life Options Rehabilitation Program: °? www.lifeoptions.org °? www.kidneyschool.org °Contact a health care provider if: °· Your symptoms get worse. °· You develop new symptoms. °Get help right away if: °· You develop symptoms of worsening kidney disease, which include: °? Headaches. °? Abnormally dark or light skin. °? Easy bruising. °? Frequent hiccups. °? Chest pain. °? Shortness of breath. °? End of menstruation in women. °? Seizures. °? Confusion or altered mental status. °? Abdominal or back pain. °? Itchiness. °· You have a fever. °· Your body is producing less urine. °· You have pain or bleeding when you urinate. °Summary °· Acute kidney injury is a sudden worsening of kidney function. °· Acute kidney injury can be caused by problems with blood flow to the kidneys, direct damage to the kidneys, and sudden blockage of urine flow. °· Symptoms of this condition may not be obvious until it becomes severe. Symptoms may include edema, lethargy, confusion, nausea or vomiting, and problems passing urine. °· This condition can usually be diagnosed with blood tests, urine tests, and imaging tests. Sometimes a kidney biopsy is done to diagnose this condition. °· Treatment for this condition often involves treating the underlying cause. It is treated with fluids, medicines, dialysis, diet changes, or surgery. °This information is not intended to replace advice given to you by your health care provider. Make  sure you discuss any questions you have with your health care provider. °Document Released: 02/12/2011 Document Revised: 11/29/2016 Document Reviewed: 07/20/2016 °Elsevier Interactive Patient Education © 2019 [employer]. ° °

## 2018-08-26 NOTE — Progress Notes (Signed)
CBGs have been showing up low, pt alert and talking.  Ordered hypoglycemic order set.  Pt given juice, crackers.  Notified Dr. Ree Kida and diabetic coordinator.  Will obtain stat BG from lab and will continue to monitor.

## 2018-08-27 LAB — BASIC METABOLIC PANEL
Anion gap: 12 (ref 5–15)
BUN: 46 mg/dL — ABNORMAL HIGH (ref 6–20)
CO2: 23 mmol/L (ref 22–32)
Calcium: 9.1 mg/dL (ref 8.9–10.3)
Chloride: 104 mmol/L (ref 98–111)
Creatinine, Ser: 1.43 mg/dL — ABNORMAL HIGH (ref 0.44–1.00)
GFR calc Af Amer: 52 mL/min — ABNORMAL LOW (ref >60)
GFR calc non Af Amer: 45 mL/min — ABNORMAL LOW (ref >60)
Glucose, Bld: 98 mg/dL (ref 70–99)
Potassium: 3.6 mmol/L (ref 3.5–5.1)
Sodium: 139 mmol/L (ref 135–145)

## 2018-08-27 LAB — UREA NITROGEN, URINE: Urea Nitrogen, Ur: 702 mg/dL

## 2018-08-27 LAB — ANTI-DNA ANTIBODY, DOUBLE-STRANDED: ds DNA Ab: 2 IU/mL (ref 0–9)

## 2018-08-27 LAB — GLUCOSE, CAPILLARY
Glucose-Capillary: 14 mg/dL — CL (ref 70–99)
Glucose-Capillary: 83 mg/dL (ref 70–99)
Glucose-Capillary: 94 mg/dL (ref 70–99)
Glucose-Capillary: 99 mg/dL (ref 70–99)

## 2018-08-27 LAB — C4 COMPLEMENT: Complement C4, Body Fluid: 24 mg/dL (ref 14–44)

## 2018-08-27 LAB — C3 COMPLEMENT: C3 Complement: 103 mg/dL (ref 82–167)

## 2018-08-27 MED ORDER — BENZONATATE 200 MG PO CAPS: 200.0000 mg | ORAL_CAPSULE | Freq: Three times a day (TID) | ORAL | 0 refills | Status: DC | PRN

## 2018-08-27 NOTE — Progress Notes (Signed)
Pt for DC today,  Spoke with diabetes coordinator and the Freestyle apparatus will not be available for her most probably due to lack of insurance.  I spoke with the pt and explained this and told her to follow up with the [facility] and wellness and Lupus MD to see if there was any way she could get it for accuracy of CBGs.  She has a follow up appointment tomorrow.

## 2018-08-27 NOTE — Progress Notes (Signed)
Inpatient Diabetes Program Recommendations  AACE/ADA: New Consensus Statement on Inpatient Glycemic Control (2015)  Target Ranges:  Prepandial:   less than 140 mg/dL      Peak postprandial:   less than 180 mg/dL (1-2 hours)      Critically ill patients:  140 - 180 mg/dL   Lab Results  Component Value Date   GLUCAP 99 08/27/2018   HGBA1C 9.0 (A) 07/02/2018    Review of Glycemic Control Results for TALYAH, SEDER (MRN [id number]) as of 08/27/2018 12:59  Ref. Range 08/26/2018 21:58 08/27/2018 00:13 08/27/2018 08:26 08/27/2018 11:52  Glucose-Capillary Latest Ref Range: 70 - 99 mg/dL 113 (H) 94 83 99   Diabetes history: DM 2 Outpatient Diabetes medications:  Amaryl 2 mg q AM, Lantus 16 units daily with breakfast, Jardiance 10 mg daily, Metformin 1000 mg bid Current orders for Inpatient glycemic control:  Novolog sensitive tid with meals and HS  Inpatient Diabetes Program Recommendations:    Referral received.  Patient see's MD at [facility].  Does not have coverage for CGM, but they may have program at [facility] that could help?? Patient was told to ask about CGM at [facility] at f/u appointment by bedside RN.  Per d/c summary, patient will need to hold both Amaryl and Jardiance until seen by PCP. She is to continue metformin and reduce Lantus dose to 8 units daily.  Discussed with MD and RN.   Thanks  Adah Perl, RN, BC-ADM Inpatient Diabetes Coordinator Pager [phone] (8a-5p)

## 2018-08-27 NOTE — ED Provider Notes (Signed)
[facility]  SURGICAL Provider Note   CSN: [id number] Arrival date & time: 08/25/18  1511     History   Chief Complaint Chief Complaint  Patient presents with  . Abdominal Pain    HPI BRIDGETT HATTABAUGH is a 43 y.o. female.  HPI Patient presents to the emergency department with vomiting over the weekend.  The patient states that she is seen by her doctor today and they felt that she needed to come to the hospital due to her past medical history and possible issues with her kidneys.  The patient states she does not have any other symptoms at this time.  The patient denies chest pain, shortness of breath, headache,blurred vision, neck pain, fever, cough, weakness, numbness, dizziness, anorexia, edema, abdominal pain,diarrhea, rash, back pain, dysuria, hematemesis, bloody stool, near syncope, or syncope. Past Medical History:  Diagnosis Date  . Abnormal Pap smear of cervix    colpo, HPV  . Allergy   . Anemia   . CHF (congestive heart failure) ([place])   . Depression    after losses  . Enlarged heart    managed by cardiology  . Fibroid   . Gestational diabetes   . Heart murmur   . Hypertension   . Infection    UTI  . Lupus ([place]) 1999    Patient Active Problem List   Diagnosis Date Noted  . AKI (acute kidney injury) ([place]) 08/25/2018  . Encounter for annual routine gynecological examination 07/09/2018  . Abnormal vaginal Pap smear 07/09/2018  . Vaginal irritation 07/09/2018  . Depo-Provera contraceptive status 07/09/2018  . Atrial fibrillation with RVR ([place]) 05/24/2018  . Cellulitis and abscess of upper extremity 05/24/2018  . A-fib ([place]) 05/12/2018  . Atrial flutter ([place]) 05/12/2018  . Acute on chronic diastolic CHF (congestive heart failure) ([place]) 03/12/2018  . Pericardial effusion 01/16/2018  . Pleuritic chest pain 01/16/2018  . Chronic diastolic CHF (congestive heart failure) ([place]) 01/16/2018  . 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  Leucopenia   . Exertional dyspnea 12/25/2017  . Lower extremity edema 12/25/2017  . Hypertension 12/25/2017  . Microcytic anemia 12/25/2017  . Insulin-requiring or dependent type II diabetes mellitus ([place]) 12/25/2017  . Chronic pain of both knees 04/23/2017  . Fibroids 01/20/2015  . Menorrhagia 01/20/2015  . Low grade squamous intraepithelial lesion (LGSIL) on cervical Pap smear on 01/20/15 01/20/2015  . Atypical glandular cells on cervical Pap smear on 01/20/15 01/20/2015  . Lupus ([place]) 07/15/2013    Past Surgical History:  Procedure Laterality Date  . A-FLUTTER ABLATION N/A 06/10/2018   Procedure: A-FLUTTER ABLATION;  Surgeon: Evans Lance, MD;  Location: [facility];  Service: Cardiovascular;  Laterality: N/A;  . RIGHT/LEFT HEART CATH AND CORONARY ANGIOGRAPHY N/A 03/14/2018   Procedure: RIGHT/LEFT HEART CATH AND CORONARY ANGIOGRAPHY;  Surgeon: [name], MD;  Location: [facility];  Service: Cardiovascular;  Laterality: N/A;  . THERAPEUTIC ABORTION       OB History    Gravida  5   Para  2   Term  0   Preterm  2   AB  3   Living  2     SAB  2   TAB  1   Ectopic  0   Multiple  0   Live Births  2            Home Medications    Prior to Admission medications   Medication Sig Start Date  End Date Taking? Authorizing Provider  acetaminophen (TYLENOL) 325 MG tablet Take 2 tablets (650 mg total) by mouth every 4 (four) hours as needed for headache or mild pain. 12/28/17  Yes Georgette Shell, MD  amLODipine (NORVASC) 5 MG tablet Take 5 mg by mouth daily.   Yes [provider]  aspirin EC 81 MG tablet Take 81 mg by mouth daily.   Yes [provider]  azaTHIOprine (IMURAN) 50 MG tablet Take 50 mg by mouth 2 (two) times daily.   Yes [provider]  colchicine 0.6 MG tablet Take 0.6 mg by mouth 2 (two) times daily.   Yes [provider]  furosemide (LASIX) 40 MG tablet Take 1 tablet (40 mg total) by  mouth 2 (two) times daily. 07/02/18  Yes Gildardo Pounds, NP  glimepiride (AMARYL) 2 MG tablet Take 1 tablet (2 mg total) by mouth every morning. 02/25/18 02/25/19 Yes Gildardo Pounds, NP  hydroxychloroquine (PLAQUENIL) 200 MG tablet Take 1 tablet (200 mg total) by mouth 2 (two) times daily. 01/18/18  Yes Rai, Ripudeep K, MD  insulin glargine (LANTUS) 100 UNIT/ML injection Inject 0.16 mLs (16 Units total) into the skin daily. Patient taking differently: Inject 16 Units into the skin daily before breakfast.  07/02/18  Yes Gildardo Pounds, NP  JARDIANCE 10 MG TABS tablet Take 10 mg by mouth daily. 07/04/18  Yes [provider]  lisinopril (PRINIVIL,ZESTRIL) 20 MG tablet TAKE 1 TABLET BY MOUTH DAILY. Patient taking differently: Take 20 mg by mouth daily.  08/19/18  Yes Gildardo Pounds, NP  metFORMIN (GLUCOPHAGE) 1000 MG tablet Take 1 tablet (1,000 mg total) by mouth 2 (two) times daily with a meal. 01/18/18  Yes Rai, Ripudeep K, MD  metoprolol tartrate (LOPRESSOR) 25 MG tablet Take 25 mg by mouth 2 (two) times daily.   Yes [provider]  omeprazole (PRILOSEC) 20 MG capsule Take 1 capsule (20 mg total) by mouth daily. Patient taking differently: Take 20 mg by mouth daily as needed (for heartburn or GERD-like symptoms).  02/25/18 08/25/18 Yes Gildardo Pounds, NP  potassium chloride (K-DUR) 10 MEQ tablet Take 1 tablet (10 mEq total) by mouth 2 (two) times daily. 05/14/18  Yes Adrian Prows, MD  rosuvastatin (CRESTOR) 20 MG tablet TAKE 1 TABLET BY MOUTH DAILY AT 6 PM. Patient taking differently: Take 20 mg by mouth daily at 6 PM.  08/22/18  Yes Gildardo Pounds, NP  apixaban (ELIQUIS) 5 MG TABS tablet Take 1 tablet (5 mg total) by mouth 2 (two) times daily. Patient not taking: Reported on 08/25/2018 05/14/18   Adrian Prows, MD  Blood Glucose Monitoring Suppl (TRUE METRIX METER) w/Device KIT Check blood sugars fasting and at bedtime 01/30/18   Freeman Caldron M, PA-C  glucose blood (TRUE METRIX BLOOD  GLUCOSE TEST) test strip Use as instructed 07/02/18   Gildardo Pounds, NP  sotalol (BETAPACE) 80 MG tablet Take 1 tablet (80 mg total) by mouth 2 (two) times daily. Patient not taking: Reported on 08/25/2018 05/14/18   Adrian Prows, MD  TRUEPLUS LANCETS 28G MISC Check blood sugars as directed 01/30/18   Argentina Donovan, PA-C    Family History Family History  Problem Relation Age of Onset  . Cancer Maternal Grandfather        lung  . Diabetes Mother   . Hypertension Maternal Grandmother   . Diabetes Father   . Hearing loss Neg Hx     Social History Social History  Tobacco Use  . Smoking status: Never Smoker  . Smokeless tobacco: Never Used  Substance Use Topics  . Alcohol use: Yes    Comment: ocasionally   . Drug use: No     Allergies   Carrot oil; Celery oil; Peanut-containing drug products; and Septra [bactrim]   Review of Systems Review of Systems All other systems negative except as documented in the HPI. All pertinent positives and negatives as reviewed in the HPI. Physical Exam Updated Vital Signs BP 117/71 (BP Location: Left Arm)   Pulse 94   Temp (!) 97.4 F (36.3 C) (Oral)   Resp 18   Ht _0  (1.651 m)   Wt 107.8 kg   SpO2 100%   BMI 39.55 kg/m   Physical Exam Vitals signs and nursing note reviewed.  Constitutional:      General: She is not in acute distress.    Appearance: She is well-developed.  HENT:     Head: Normocephalic and atraumatic.  Eyes:     Pupils: Pupils are equal, round, and reactive to light.  Neck:     Musculoskeletal: Normal range of motion and neck supple.  Cardiovascular:     Rate and Rhythm: Normal rate and regular rhythm.     Heart sounds: Normal heart sounds. No murmur. No friction rub. No gallop.   Pulmonary:     Effort: Pulmonary effort is normal. No respiratory distress.     Breath sounds: Normal breath sounds. No wheezing.  Abdominal:     General: Bowel sounds are normal. There is no distension.     Palpations:  Abdomen is soft.     Tenderness: There is no abdominal tenderness.  Skin:    General: Skin is warm and dry.     Capillary Refill: Capillary refill takes less than 2 seconds.     Findings: No erythema or rash.  Neurological:     Mental Status: She is alert and oriented to person, place, and time.     Motor: No abnormal muscle tone.     Coordination: Coordination normal.  Psychiatric:        Behavior: Behavior normal.      ED Treatments / Results  Labs (all labs ordered are listed, but only abnormal results are displayed) Labs Reviewed  LIPASE, BLOOD - Abnormal; Notable for the following components:      Result Value   Lipase 92 (*)    All other components within normal limits  COMPREHENSIVE METABOLIC PANEL - Abnormal; Notable for the following components:   BUN 86 (*)    Creatinine, Ser 2.62 (*)    Total Protein 8.5 (*)    GFR calc non Af Amer 22 (*)    GFR calc Af Amer 25 (*)    All other components within normal limits  CBC - Abnormal; Notable for the following components:   Hemoglobin 10.7 (*)    HCT 33.6 (*)    MCV 77.6 (*)    MCH 24.7 (*)    All other components within normal limits  URINALYSIS, ROUTINE W REFLEX MICROSCOPIC - Abnormal; Notable for the following components:   APPearance HAZY (*)    Hgb urine dipstick SMALL (*)    Protein, ur 30 (*)    Leukocytes, UA MODERATE (*)    Bacteria, UA RARE (*)    All other components within normal limits  CBC WITH DIFFERENTIAL/PLATELET - Abnormal; Notable for the following components:   Hemoglobin 10.0 (*)    HCT 31.5 (*)  MCV 76.8 (*)    MCH 24.4 (*)    Monocytes Absolute 1.1 (*)    All other components within normal limits  BASIC METABOLIC PANEL - Abnormal; Notable for the following components:   Potassium 3.1 (*)    CO2 20 (*)    Glucose, Bld 173 (*)    BUN 70 (*)    Creatinine, Ser 2.07 (*)    Calcium 8.6 (*)    GFR calc non Af Amer 29 (*)    GFR calc Af Amer 33 (*)    All other components within normal  limits  GLUCOSE, CAPILLARY - Abnormal; Notable for the following components:   Glucose-Capillary 100 (*)    All other components within normal limits  BASIC METABOLIC PANEL - Abnormal; Notable for the following components:   Glucose, Bld 149 (*)    BUN 57 (*)    Creatinine, Ser 1.80 (*)    GFR calc non Af Amer 34 (*)    GFR calc Af Amer 40 (*)    All other components within normal limits  GLUCOSE, CAPILLARY - Abnormal; Notable for the following components:   Glucose-Capillary 47 (*)    All other components within normal limits  GLUCOSE, CAPILLARY - Abnormal; Notable for the following components:   Glucose-Capillary 14 (*)    All other components within normal limits  GLUCOSE, CAPILLARY - Abnormal; Notable for the following components:   Glucose-Capillary 34 (*)    All other components within normal limits  GLUCOSE, RANDOM - Abnormal; Notable for the following components:   Glucose, Bld 212 (*)    All other components within normal limits  GLUCOSE, CAPILLARY - Abnormal; Notable for the following components:   Glucose-Capillary 132 (*)    All other components within normal limits  GLUCOSE, CAPILLARY - Abnormal; Notable for the following components:   Glucose-Capillary 113 (*)    All other components within normal limits  HIV ANTIBODY (ROUTINE TESTING W REFLEX)  SODIUM, URINE, RANDOM  CREATININE, URINE, RANDOM  GLUCOSE, CAPILLARY  GLUCOSE, CAPILLARY  GLUCOSE, CAPILLARY  UREA NITROGEN, URINE  ANTI-DNA ANTIBODY, DOUBLE-STRANDED  C3 COMPLEMENT  C4 COMPLEMENT  BASIC METABOLIC PANEL  I-STAT BETA HCG BLOOD, ED ([facility], [facility], [facility] ONLY)  CBG MONITORING, ED    EKG None  Radiology Ct Renal Stone Study  Result Date: 08/25/2018 CLINICAL DATA:  Left flank pain for 2 days. EXAM: CT ABDOMEN AND PELVIS WITHOUT CONTRAST TECHNIQUE: Multidetector CT imaging of the abdomen and pelvis was performed following the standard protocol without IV contrast. COMPARISON:  None. FINDINGS: Lower chest:  Infiltration or atelectasis in both lung bases, greater on the right. Mild cardiac enlargement. Small pericardial effusion. Hepatobiliary: No focal liver abnormality is seen. No gallstones, gallbladder wall thickening, or biliary dilatation. Pancreas: Unremarkable. No pancreatic ductal dilatation or surrounding inflammatory changes. Spleen: Normal in size without focal abnormality. Adrenals/Urinary Tract: Adrenal glands are unremarkable. Kidneys are normal, without renal calculi, focal lesion, or hydronephrosis. Bladder is unremarkable. Stomach/Bowel: Stomach is within normal limits. Appendix appears normal. No evidence of bowel wall thickening, distention, or inflammatory changes. Vascular/Lymphatic: Aortic atherosclerosis. No enlarged abdominal or pelvic lymph nodes. Reproductive: Nodular enlargement of the uterus with multiple calcifications consistent with calcified fibroids. No abnormal adnexal masses. Other: No abdominal wall hernia or abnormality. No abdominopelvic ascites. Musculoskeletal: No acute or significant osseous findings. IMPRESSION: 1. Infiltration or atelectasis seen in both lung bases. 2. Mild cardiac enlargement with small pericardial effusion. 3. No renal or ureteral stone or obstruction. 4. Multiple  calcified uterine fibroids. 5. Aortic atherosclerosis. Electronically Signed   By: Lucienne Capers M.D.   On: 08/25/2018 21:24    Procedures Procedures (including critical care time)  Medications Ordered in ED Medications  aspirin EC tablet 81 mg (81 mg Oral Given 08/26/18 1019)  hydroxychloroquine (PLAQUENIL) tablet 200 mg (200 mg Oral Given 08/26/18 2132)  amLODipine (NORVASC) tablet 5 mg (5 mg Oral Given 08/26/18 1020)  metoprolol tartrate (LOPRESSOR) tablet 25 mg (25 mg Oral Given 08/26/18 2131)  rosuvastatin (CRESTOR) tablet 20 mg (20 mg Oral Given 08/26/18 1842)  heparin injection 5,000 Units (5,000 Units Subcutaneous Given 08/26/18 2132)  0.9 %  sodium chloride infusion (  Intravenous Rate/Dose Verify 08/26/18 0600)  acetaminophen (TYLENOL) tablet 650 mg (650 mg Oral Given 08/26/18 1031)    Or  acetaminophen (TYLENOL) suppository 650 mg ( Rectal See Alternative 08/26/18 1031)  ondansetron (ZOFRAN) tablet 4 mg (has no administration in time range)    Or  ondansetron (ZOFRAN) injection 4 mg (has no administration in time range)  polyethylene glycol (MIRALAX / GLYCOLAX) packet 17 g (has no administration in time range)  insulin aspart (novoLOG) injection 0-9 Units (0 Units Subcutaneous Not Given 08/26/18 1702)  insulin aspart (novoLOG) injection 0-5 Units (0 Units Subcutaneous Not Given 08/26/18 2140)  colchicine tablet 0.3 mg (0.3 mg Oral Given 08/26/18 2133)  azaTHIOprine (IMURAN) tablet 25 mg (25 mg Oral Given 08/26/18 2132)  dextrose 50 % solution (  Not Given 08/26/18 1747)  benzonatate (TESSALON) capsule 200 mg (200 mg Oral Given 08/26/18 2135)  sodium chloride 0.9 % bolus 1,000 mL (0 mLs Intravenous Stopped 08/25/18 1949)     Initial Impression / Assessment and Plan / ED Course  I have reviewed the triage vital signs and the nursing notes.  Pertinent labs & imaging results that were available during my care of the patient were reviewed by me and considered in my medical decision making (see chart for details).     Patient will be admitted to the hospital for assurance that her kidney function is improving.  She is not able to get significant amounts of fluids here in the emergency department due to her CHF history.  Patient is advised the plan and all questions were answered.  Patient voiced understanding.  Final Clinical Impressions(s) / ED Diagnoses   Final diagnoses:  None    ED Discharge Orders         Ordered    Discharge instructions    Comments:  Patient will be discharged to home.  Patient will need to follow up with primary care provider within one week of discharge, repeat BMP.  Patient should continue medications as prescribed.  Patient should  follow a heart healthy/carb modified diet.   Pending           Dalia Heading, Hershal Coria 08/27/18 0007    Duffy Bruce, MD 08/27/18 1000

## 2018-08-27 NOTE — Progress Notes (Signed)
Pt ready for DC, all instructions given and copy given to pt.  Pt understands which meds to discontinue until she sees her PCP.  Pt states she will take her home daily meds once at home today.

## 2018-09-04 MED FILL — azaTHIOprine 50 MG TABS: 50 | 30 days supply | Qty: 60 | Fill #1

## 2018-09-04 MED FILL — BENZONATATE 100 MG CAP: 100 | 10 days supply | Qty: 60 | Fill #0

## 2018-09-16 ENCOUNTER — Other Ambulatory Visit: Payer: Self-pay

## 2018-09-16 ENCOUNTER — Ambulatory Visit: Payer: Self-pay | Attending: Critical Care Medicine | Admitting: Critical Care Medicine

## 2018-09-16 ENCOUNTER — Encounter: Payer: Self-pay | Admitting: Critical Care Medicine

## 2018-09-16 VITALS — BP 119/75 | HR 104 | Temp 98.3°F | Resp 18 | Ht 60.0 in | Wt 233.0 lb

## 2018-09-16 DIAGNOSIS — I3139 Other pericardial effusion (noninflammatory): Secondary | ICD-10-CM

## 2018-09-16 DIAGNOSIS — B351 Tinea unguium: Secondary | ICD-10-CM

## 2018-09-16 DIAGNOSIS — K219 Gastro-esophageal reflux disease without esophagitis: Secondary | ICD-10-CM

## 2018-09-16 DIAGNOSIS — I483 Typical atrial flutter: Secondary | ICD-10-CM

## 2018-09-16 DIAGNOSIS — E876 Hypokalemia: Secondary | ICD-10-CM

## 2018-09-16 DIAGNOSIS — IMO0002 Reserved for concepts with insufficient information to code with codable children: Secondary | ICD-10-CM

## 2018-09-16 DIAGNOSIS — Z8249 Family history of ischemic heart disease and other diseases of the circulatory system: Secondary | ICD-10-CM | POA: Insufficient documentation

## 2018-09-16 DIAGNOSIS — M329 Systemic lupus erythematosus, unspecified: Secondary | ICD-10-CM | POA: Insufficient documentation

## 2018-09-16 DIAGNOSIS — I4891 Unspecified atrial fibrillation: Secondary | ICD-10-CM | POA: Insufficient documentation

## 2018-09-16 DIAGNOSIS — I110 Hypertensive heart disease with heart failure: Secondary | ICD-10-CM | POA: Insufficient documentation

## 2018-09-16 DIAGNOSIS — Z833 Family history of diabetes mellitus: Secondary | ICD-10-CM | POA: Insufficient documentation

## 2018-09-16 DIAGNOSIS — N179 Acute kidney failure, unspecified: Secondary | ICD-10-CM

## 2018-09-16 DIAGNOSIS — I10 Essential (primary) hypertension: Secondary | ICD-10-CM

## 2018-09-16 DIAGNOSIS — R0609 Other forms of dyspnea: Secondary | ICD-10-CM

## 2018-09-16 DIAGNOSIS — Z882 Allergy status to sulfonamides status: Secondary | ICD-10-CM | POA: Insufficient documentation

## 2018-09-16 DIAGNOSIS — Z881 Allergy status to other antibiotic agents status: Secondary | ICD-10-CM | POA: Insufficient documentation

## 2018-09-16 DIAGNOSIS — R0600 Dyspnea, unspecified: Secondary | ICD-10-CM

## 2018-09-16 DIAGNOSIS — R05 Cough: Secondary | ICD-10-CM

## 2018-09-16 DIAGNOSIS — Z7982 Long term (current) use of aspirin: Secondary | ICD-10-CM | POA: Insufficient documentation

## 2018-09-16 DIAGNOSIS — Z794 Long term (current) use of insulin: Secondary | ICD-10-CM | POA: Insufficient documentation

## 2018-09-16 DIAGNOSIS — I313 Pericardial effusion (noninflammatory): Secondary | ICD-10-CM

## 2018-09-16 DIAGNOSIS — E1165 Type 2 diabetes mellitus with hyperglycemia: Secondary | ICD-10-CM

## 2018-09-16 DIAGNOSIS — Z79899 Other long term (current) drug therapy: Secondary | ICD-10-CM | POA: Insufficient documentation

## 2018-09-16 DIAGNOSIS — R059 Cough, unspecified: Secondary | ICD-10-CM

## 2018-09-16 DIAGNOSIS — I5032 Chronic diastolic (congestive) heart failure: Secondary | ICD-10-CM

## 2018-09-16 LAB — POCT GLYCOSYLATED HEMOGLOBIN (HGB A1C): Hemoglobin A1C: 6.2 % — AB (ref 4.0–5.6)

## 2018-09-16 MED ORDER — METOPROLOL TARTRATE 25 MG PO TABS: 25.0000 mg | ORAL_TABLET | Freq: Two times a day (BID) | ORAL | 4 refills | Status: DC

## 2018-09-16 MED ORDER — ROSUVASTATIN CALCIUM 20 MG PO TABS: 20.0000 mg | ORAL_TABLET | Freq: Every day | ORAL | 4 refills | Status: DC

## 2018-09-16 MED ORDER — INSULIN GLARGINE 100 UNIT/ML ~~LOC~~ SOLN: 8.0000 [IU] | Freq: Every day | SUBCUTANEOUS | 3 refills | Status: DC

## 2018-09-16 MED ORDER — METFORMIN HCL 1000 MG PO TABS: 1000.0000 mg | ORAL_TABLET | Freq: Two times a day (BID) | ORAL | 3 refills | Status: DC

## 2018-09-16 MED ORDER — OMEPRAZOLE 20 MG PO CPDR: 20.0000 mg | DELAYED_RELEASE_CAPSULE | Freq: Every day | ORAL | 3 refills | Status: DC | PRN

## 2018-09-16 MED ORDER — AMLODIPINE BESYLATE 5 MG PO TABS: 5.0000 mg | ORAL_TABLET | Freq: Every day | ORAL | 4 refills | Status: DC

## 2018-09-16 NOTE — Assessment & Plan Note (Signed)
History of atrial flutter status post ablation now in normal sinus rhythm with PACs  Per cardiology

## 2018-09-16 NOTE — Progress Notes (Addendum)
Subjective:    Patient ID: [name], female    DOB: 05-Mar-1976, 43 y.o.   MRN: [id number]  This is a 43 year old female with a longstanding history of lupus who was recently between the 13th and 15 January at [facility] with acute kidney injury in the setting of history of atrial flutter lupus chronic diastolic heart failure diabetes mellitus and hypertension.  The patient was found to have nausea and vomiting.  Patient was thought to be volume depleted in the setting of ongoing use of lisinopril Amaryl and Jardiance.  Creatinine was up to 2.6 but down to 1.4 on discharge.  Patient was given IV fluid hydration.  Note the patient has a history of renal involvement of her lupus with kidney biopsy in 2003.  She has frequent monitoring by her rheumatologist here.  The patient also has a history of atrial fibrillation and flutter that was undergone ablation and now is off Eliquis but maintains medication to maintain rhythm  Patient also has diabetes and had actually hypoglycemia.  Her insulin dose has been reduced and she is now only on metformin and is off Amaryl and Jardiance  Patient is in need of a retinal eye exam and foot exam  She is pending Medicaid   Past Medical History:  Diagnosis Date  . Abnormal Pap smear of cervix    colpo, HPV  . Allergy   . Anemia   . CHF (congestive heart failure) ([place])   . Depression    after losses  . Enlarged heart    managed by cardiology  . Fibroid   . Gestational diabetes   . Heart murmur   . Hypertension   . Infection    UTI  . Lupus ([place]) 1999     Family History  Problem Relation Age of Onset  . Cancer Maternal Grandfather        lung  . Diabetes Mother   . Hypertension Maternal Grandmother   . Diabetes Father   . Hearing loss Neg Hx      Social History   Socioeconomic History  . Marital status: Married    Spouse name: Not on file  . Number of children: Not on file  . Years of education: Not on file  .  Highest education level: Not on file  Occupational History  . Not on file  Social Needs  . Financial resource strain: Not on file  . Food insecurity:    Worry: Patient refused    Inability: Patient refused  . Transportation needs:    Medical: Patient refused    Non-medical: Patient refused  Tobacco Use  . Smoking status: Never Smoker  . Smokeless tobacco: Never Used  Substance and Sexual Activity  . Alcohol use: Yes    Comment: ocasionally   . Drug use: No  . Sexual activity: Yes    Birth control/protection: Injection  Lifestyle  . Physical activity:    Days per week: Patient refused    Minutes per session: Patient refused  . Stress: Not on file  Relationships  . Social connections:    Talks on phone: Patient refused    Gets together: Patient refused    Attends religious service: Patient refused    Active member of club or organization: Patient refused    Attends meetings of clubs or organizations: Patient refused    Relationship status: Patient refused  . Intimate partner violence:    Fear of current or ex partner: Patient refused  Emotionally abused: Patient refused    Physically abused: Patient refused    Forced sexual activity: Patient refused  Other Topics Concern  . Not on file  Social History Narrative  . Not on file     Allergies  Allergen Reactions  . Carrot Oil Shortness Of Breath, Itching, Rash and Other (See Comments)    Bumps on tongue, also  . Celery Oil Shortness Of Breath, Itching, Rash and Other (See Comments)    Bumps on tongue, also  . Peanut-Containing Drug Products Anaphylaxis, Shortness Of Breath, Itching, Rash and Other (See Comments)    Bumps on tongue, also  . Septra [Bactrim] Hives     Outpatient Medications Prior to Visit  Medication Sig Dispense Refill  . acetaminophen (TYLENOL) 325 MG tablet Take 2 tablets (650 mg total) by mouth every 4 (four) hours as needed for headache or mild pain.    Marland Kitchen aspirin EC 81 MG tablet Take 81 mg by  mouth daily.    Marland Kitchen azaTHIOprine (IMURAN) 50 MG tablet Take 50 mg by mouth 2 (two) times daily.    . benzonatate (TESSALON) 200 MG capsule Take 1 capsule (200 mg total) by mouth 3 (three) times daily as needed for cough. 30 capsule 0  . Blood Glucose Monitoring Suppl (TRUE METRIX METER) w/Device KIT Check blood sugars fasting and at bedtime 1 kit 0  . furosemide (LASIX) 40 MG tablet Take 1 tablet (40 mg total) by mouth 2 (two) times daily. 60 tablet 3  . glucose blood (TRUE METRIX BLOOD GLUCOSE TEST) test strip Use as instructed 100 each 12  . hydroxychloroquine (PLAQUENIL) 200 MG tablet Take 1 tablet (200 mg total) by mouth 2 (two) times daily. 60 tablet 4  . TRUEPLUS LANCETS 28G MISC Check blood sugars as directed 100 each 1  . amLODipine (NORVASC) 5 MG tablet Take 5 mg by mouth daily.    . insulin glargine (LANTUS) 100 UNIT/ML injection Inject 0.16 mLs (16 Units total) into the skin daily. (Patient taking differently: Inject 8 Units into the skin daily before breakfast. ) 10 mL 2  . metFORMIN (GLUCOPHAGE) 1000 MG tablet Take 1 tablet (1,000 mg total) by mouth 2 (two) times daily with a meal. 60 tablet 3  . metoprolol tartrate (LOPRESSOR) 25 MG tablet Take 25 mg by mouth 2 (two) times daily.    . rosuvastatin (CRESTOR) 20 MG tablet TAKE 1 TABLET BY MOUTH DAILY AT 6 PM. (Patient taking differently: Take 20 mg by mouth daily at 6 PM. ) 90 tablet 0  . colchicine 0.6 MG tablet Take 0.6 mg by mouth 2 (two) times daily.    Marland Kitchen glimepiride (AMARYL) 2 MG tablet Take 1 tablet (2 mg total) by mouth every morning. (Patient not taking: Reported on 09/16/2018) 30 tablet 11  . JARDIANCE 10 MG TABS tablet Take 10 mg by mouth daily.  2  . lisinopril (PRINIVIL,ZESTRIL) 20 MG tablet TAKE 1 TABLET BY MOUTH DAILY. (Patient not taking: No sig reported) 90 tablet 0  . omeprazole (PRILOSEC) 20 MG capsule Take 1 capsule (20 mg total) by mouth daily. (Patient taking differently: Take 20 mg by mouth daily as needed (for  heartburn or GERD-like symptoms). ) 90 capsule 3  . potassium chloride (K-DUR) 10 MEQ tablet Take 1 tablet (10 mEq total) by mouth 2 (two) times daily. (Patient not taking: Reported on 09/16/2018) 60 tablet 1   No facility-administered medications prior to visit.     Review of Systems  Constitutional: Positive for  fatigue. Negative for appetite change, diaphoresis and fever.  HENT: Positive for congestion, postnasal drip, rhinorrhea, sinus pressure, sinus pain, sneezing, sore throat, trouble swallowing and voice change. Negative for ear discharge, ear pain, hearing loss, mouth sores, nosebleeds and tinnitus.        Mucus is clear out of nose   Eyes: Positive for itching. Negative for photophobia, pain, discharge, redness and visual disturbance.  Respiratory: Positive for cough, choking, shortness of breath and wheezing. Negative for chest tightness.   Cardiovascular: Negative for chest pain, palpitations and leg swelling.  Gastrointestinal: Negative for abdominal pain, diarrhea, nausea and vomiting.       No GERD  Genitourinary: Negative for dysuria and frequency.  Musculoskeletal: Positive for arthralgias, back pain and neck pain.  Skin: Positive for rash.  Neurological: Negative for dizziness, seizures, syncope, light-headedness and numbness.  Hematological: Negative for adenopathy.  Psychiatric/Behavioral: Negative for dysphoric mood, self-injury and suicidal ideas. The patient is not nervous/anxious.        Objective:   Physical Exam Vitals:   09/16/18 1528  BP: 119/75  Pulse: (!) 104  Resp: 18  Temp: 98.3 F (36.8 C)  TempSrc: Oral  SpO2: 98%  Weight: 233 lb (105.7 kg)  Height: 5' (1.524 m)    Gen: Pleasant, obese,  in no distress,  normal affect  ENT: No lesions,  mouth clear,  oropharynx clear, no postnasal drip  Neck: No JVD, no TMG, no carotid bruits  Lungs: No use of accessory muscles, no dullness to percussion, bibasilar dry rales   cardiovascular: RRR, heart  sounds normal, no murmur or gallops, no peripheral edema  Abdomen: soft and NT, no HSM,  BS normal  Musculoskeletal: No deformities, no cyanosis or clubbing Foot exam is normal except for excess toenails   Neuro: alert, non focal  Skin: Warm, no lesions or rashes  No results found.   HgbA1C 6.2 09/16/18  ECG: NSR. PACs no acute changes  CBC Latest Ref Rng & Units 08/26/2018 08/25/2018 05/25/2018  WBC 4.0 - 10.5 K/uL 4.2 5.7 8.7  Hemoglobin 12.0 - 15.0 g/dL 10.0(L) 10.7(L) 12.2  Hematocrit 36.0 - 46.0 % 31.5(L) 33.6(L) 39.9  Platelets 150 - 400 K/uL 293 332 317   BMP Latest Ref Rng & Units 08/27/2018 08/26/2018 08/26/2018  Glucose 70 - 99 mg/dL 98 212(H) 149(H)  BUN 6 - 20 mg/dL 46(H) - 57(H)  Creatinine 0.44 - 1.00 mg/dL 1.43(H) - 1.80(H)  Sodium 135 - 145 mmol/L 139 - 140  Potassium 3.5 - 5.1 mmol/L 3.6 - 3.6  Chloride 98 - 111 mmol/L 104 - 107  CO2 22 - 32 mmol/L 23 - 24  Calcium 8.9 - 10.3 mg/dL 9.1 - 8.9      Assessment & Plan:  I personally reviewed all images and lab data in the [facility] system as well as any outside material available during this office visit and agree with the  radiology impressions.   Hypertension History of hypertension with good control at this time  No change in blood pressure medications  Refills sent to pharmacy on amlodipine and metoprolol this visit  Pericardial effusion History of pericardial effusion and diastolic heart failure stable at this time  Chronic diastolic CHF (congestive heart failure) (HCC) History of chronic diastolic heart failure stable at this time  Atrial flutter (HCC) History of atrial flutter status post ablation now in normal sinus rhythm with PACs  Per cardiology  Type 2 diabetes mellitus with hyperglycemia, without long-term current use of insulin ([place])  Type 2 diabetes with hyperglycemia with improved hemoglobin A1c down to 6.2  Have sent refills to the pharmacy with metformin, and Lantus will be continued at 8  units daily  Note Jardiance and Amaryl have been discontinued because of renal insufficiency  AKI (acute kidney injury) ([place]) History of acute kidney injury we will recheck basic metabolic panel today  Continue to hold and discontinue lisinopril Amaryl and Jardiance   Colinda was seen today for hospitalization follow-up.  Diagnoses and all orders for this visit:  Lupus ([place]) -     CT Chest High Resolution; Future  Type 2 diabetes mellitus with hyperglycemia, with long-term current use of insulin (HCC) -     HgB A1c -     insulin glargine (LANTUS) 100 UNIT/ML injection; Inject 0.08 mLs (8 Units total) into the skin daily before breakfast. -     Basic metabolic panel; Future -     Basic metabolic panel  Gastroesophageal reflux disease, esophagitis presence not specified -     Discontinue: omeprazole (PRILOSEC) 20 MG capsule; Take 1 capsule (20 mg total) by mouth daily as needed (for heartburn or GERD-like symptoms). -     omeprazole (PRILOSEC) 20 MG capsule; Take 1 capsule (20 mg total) by mouth daily as needed (for heartburn or GERD-like symptoms).  Cough -     CT Chest High Resolution; Future  Exertional dyspnea  AKI (acute kidney injury) ([place]) -     Basic metabolic panel; Future -     Basic metabolic panel  Typical atrial flutter (HCC) -     EKG 12-Lead  Essential hypertension  Pericardial effusion  Chronic diastolic CHF (congestive heart failure) (HCC)  Other orders -     rosuvastatin (CRESTOR) 20 MG tablet; Take 1 tablet (20 mg total) by mouth daily at 6 PM. -     metoprolol tartrate (LOPRESSOR) 25 MG tablet; Take 1 tablet (25 mg total) by mouth 2 (two) times daily. -     metFORMIN (GLUCOPHAGE) 1000 MG tablet; Take 1 tablet (1,000 mg total) by mouth 2 (two) times daily with a meal. -     amLODipine (NORVASC) 5 MG tablet; Take 1 tablet (5 mg total) by mouth daily.  Note I would like to have toenails evaluated by podiatry with her DM status.    09/17/2018 Note K  level low  Will replace with KCL to pharmacy  Cr is better : BMP Latest Ref Rng & Units 09/16/2018 08/27/2018 08/26/2018  Glucose 65 - 99 mg/dL 79 98 212(H)  BUN 6 - 24 mg/dL 21 46(H) -  Creatinine 0.57 - 1.00 mg/dL 1.07(H) 1.43(H) -  BUN/Creat Ratio 9 - 23 20 - -  Sodium 134 - 144 mmol/L 138 139 -  Potassium 3.5 - 5.2 mmol/L 3.4(L) 3.6 -  Chloride 96 - 106 mmol/L 97 104 -  CO2 20 - 29 mmol/L 25 23 -  Calcium 8.7 - 10.2 mg/dL 9.2 9.1 -

## 2018-09-16 NOTE — Assessment & Plan Note (Signed)
History of pericardial effusion and diastolic heart failure stable at this time

## 2018-09-16 NOTE — Assessment & Plan Note (Signed)
History of chronic diastolic heart failure stable at this time

## 2018-09-16 NOTE — Patient Instructions (Addendum)
A CT scan of the chest will be ordered  Please schedule an ophthalmology exam for examination of your right 9 please send [place] the report  All of your medications were refilled and sent to our pharmacy  Please ensure you have stopped the Amaryl and Jardiance and lisinopril and potassium  Labs today will include a basic metabolic panel to assess your kidney function  See below a recommended diet  Return in 1 month's time for appointment with your primary care  Please obtain financial assistance paperwork so we can assist you with your tests and studies DASH Eating Plan DASH stands for "Dietary Approaches to Stop Hypertension." The DASH eating plan is a healthy eating plan that has been shown to reduce high blood pressure (hypertension). It may also reduce your risk for type 2 diabetes, heart disease, and stroke. The DASH eating plan may also help with weight loss. What are tips for following this plan?  General guidelines  Avoid eating more than 2,300 mg (milligrams) of salt (sodium) a day. If you have hypertension, you may need to reduce your sodium intake to 1,500 mg a day.  Limit alcohol intake to no more than 1 drink a day for nonpregnant women and 2 drinks a day for men. One drink equals 12 oz of beer, 5 oz of wine, or 1 oz of hard liquor.  Work with your health care provider to maintain a healthy body weight or to lose weight. Ask what an ideal weight is for you.  Get at least 30 minutes of exercise that causes your heart to beat faster (aerobic exercise) most days of the week. Activities may include walking, swimming, or biking.  Work with your health care provider or diet and nutrition specialist (dietitian) to adjust your eating plan to your individual calorie needs. Reading food labels   Check food labels for the amount of sodium per serving. Choose foods with less than 5 percent of the Daily Value of sodium. Generally, foods with less than 300 mg of sodium per serving fit  into this eating plan.  To find whole grains, look for the word "whole" as the first word in the ingredient list. Shopping  Buy products labeled as "low-sodium" or "no salt added."  Buy fresh foods. Avoid canned foods and premade or frozen meals. Cooking  Avoid adding salt when cooking. Use salt-free seasonings or herbs instead of table salt or sea salt. Check with your health care provider or pharmacist before using salt substitutes.  Do not fry foods. Cook foods using healthy methods such as baking, boiling, grilling, and broiling instead.  Cook with heart-healthy oils, such as olive, canola, soybean, or sunflower oil. Meal planning  Eat a balanced diet that includes: ? 5 or more servings of fruits and vegetables each day. At each meal, try to fill half of your plate with fruits and vegetables. ? Up to 6-8 servings of whole grains each day. ? Less than 6 oz of lean meat, poultry, or fish each day. A 3-oz serving of meat is about the same size as a deck of cards. One egg equals 1 oz. ? 2 servings of low-fat dairy each day. ? A serving of nuts, seeds, or beans 5 times each week. ? Heart-healthy fats. Healthy fats called Omega-3 fatty acids are found in foods such as flaxseeds and coldwater fish, like sardines, salmon, and mackerel.  Limit how much you eat of the following: ? Canned or prepackaged foods. ? Food that is high in trans  fat, such as fried foods. ? Food that is high in saturated fat, such as fatty meat. ? Sweets, desserts, sugary drinks, and other foods with added sugar. ? Full-fat dairy products.  Do not salt foods before eating.  Try to eat at least 2 vegetarian meals each week.  Eat more home-cooked food and less restaurant, buffet, and fast food.  When eating at a restaurant, ask that your food be prepared with less salt or no salt, if possible. What foods are recommended? The items listed may not be a complete list. Talk with your dietitian about what dietary  choices are best for you. Grains Whole-grain or whole-wheat bread. Whole-grain or whole-wheat pasta. Brown rice. [place]. Bulgur. Whole-grain and low-sodium cereals. Pita bread. Low-fat, low-sodium crackers. Whole-wheat flour tortillas. Vegetables Fresh or frozen vegetables (raw, steamed, roasted, or grilled). Low-sodium or reduced-sodium tomato and vegetable juice. Low-sodium or reduced-sodium tomato sauce and tomato paste. Low-sodium or reduced-sodium canned vegetables. Fruits All fresh, dried, or frozen fruit. Canned fruit in natural juice (without added sugar). Meat and other protein foods Skinless chicken or [place]. Ground chicken or [place]. Pork with fat trimmed off. Fish and seafood. Egg whites. Dried beans, peas, or lentils. Unsalted nuts, nut butters, and seeds. Unsalted canned beans. Lean cuts of beef with fat trimmed off. Low-sodium, lean deli meat. Dairy Low-fat (1%) or fat-free (skim) milk. Fat-free, low-fat, or reduced-fat cheeses. Nonfat, low-sodium ricotta or cottage cheese. Low-fat or nonfat yogurt. Low-fat, low-sodium cheese. Fats and oils Soft margarine without trans fats. Vegetable oil. Low-fat, reduced-fat, or light mayonnaise and salad dressings (reduced-sodium). Canola, safflower, olive, soybean, and sunflower oils. Avocado. Seasoning and other foods Herbs. Spices. Seasoning mixes without salt. Unsalted popcorn and pretzels. Fat-free sweets. What foods are not recommended? The items listed may not be a complete list. Talk with your dietitian about what dietary choices are best for you. Grains Baked goods made with fat, such as croissants, muffins, or some breads. Dry pasta or rice meal packs. Vegetables Creamed or fried vegetables. Vegetables in a cheese sauce. Regular canned vegetables (not low-sodium or reduced-sodium). Regular canned tomato sauce and paste (not low-sodium or reduced-sodium). Regular tomato and vegetable juice (not low-sodium or reduced-sodium).  Angie Fava. Olives. Fruits Canned fruit in a light or heavy syrup. Fried fruit. Fruit in cream or butter sauce. Meat and other protein foods Fatty cuts of meat. Ribs. Fried meat. Berniece Salines. Sausage. Bologna and other processed lunch meats. Salami. Fatback. Hotdogs. Bratwurst. Salted nuts and seeds. Canned beans with added salt. Canned or smoked fish. Whole eggs or egg yolks. Chicken or [place] with skin. Dairy Whole or 2% milk, cream, and half-and-half. Whole or full-fat cream cheese. Whole-fat or sweetened yogurt. Full-fat cheese. Nondairy creamers. Whipped toppings. Processed cheese and cheese spreads. Fats and oils Butter. Stick margarine. Lard. Shortening. Ghee. Bacon fat. Tropical oils, such as coconut, palm kernel, or palm oil. Seasoning and other foods Salted popcorn and pretzels. Onion salt, garlic salt, seasoned salt, table salt, and sea salt. Worcestershire sauce. Tartar sauce. Barbecue sauce. Teriyaki sauce. Soy sauce, including reduced-sodium. Steak sauce. Canned and packaged gravies. Fish sauce. Oyster sauce. Cocktail sauce. Horseradish that you find on the shelf. Ketchup. Mustard. Meat flavorings and tenderizers. Bouillon cubes. Hot sauce and Tabasco sauce. Premade or packaged marinades. Premade or packaged taco seasonings. Relishes. Regular salad dressings. Where to find more information:  National Heart, Lung, and [place]: https://wilson-eaton.com/  American Heart Association: www.heart.org Summary  The DASH eating plan is a healthy eating plan that has  been shown to reduce high blood pressure (hypertension). It may also reduce your risk for type 2 diabetes, heart disease, and stroke.  With the DASH eating plan, you should limit salt (sodium) intake to 2,300 mg a day. If you have hypertension, you may need to reduce your sodium intake to 1,500 mg a day.  When on the DASH eating plan, aim to eat more fresh fruits and vegetables, whole grains, lean proteins, low-fat dairy, and  heart-healthy fats.  Work with your health care provider or diet and nutrition specialist (dietitian) to adjust your eating plan to your individual calorie needs. This information is not intended to replace advice given to you by your health care provider. Make sure you discuss any questions you have with your health care provider. Document Released: 07/19/2011 Document Revised: 07/23/2016 Document Reviewed: 07/23/2016 Elsevier Interactive Patient Education  2019 [employer].

## 2018-09-16 NOTE — Assessment & Plan Note (Signed)
History of acute kidney injury we will recheck basic metabolic panel today  Continue to hold and discontinue lisinopril Amaryl and Jardiance

## 2018-09-16 NOTE — Assessment & Plan Note (Signed)
History of hypertension with good control at this time  No change in blood pressure medications  Refills sent to pharmacy on amlodipine and metoprolol this visit

## 2018-09-16 NOTE — Assessment & Plan Note (Signed)
Type 2 diabetes with hyperglycemia with improved hemoglobin A1c down to 6.2  Have sent refills to the pharmacy with metformin, and Lantus will be continued at 8 units daily  Note Jardiance and Amaryl have been discontinued because of renal insufficiency

## 2018-09-17 DIAGNOSIS — E876 Hypokalemia: Secondary | ICD-10-CM | POA: Insufficient documentation

## 2018-09-17 LAB — BASIC METABOLIC PANEL
BUN/Creatinine Ratio: 20 (ref 9–23)
BUN: 21 mg/dL (ref 6–24)
CO2: 25 mmol/L (ref 20–29)
Calcium: 9.2 mg/dL (ref 8.7–10.2)
Chloride: 97 mmol/L (ref 96–106)
Creatinine, Ser: 1.07 mg/dL — ABNORMAL HIGH (ref 0.57–1.00)
GFR calc Af Amer: 73 mL/min/{1.73_m2} (ref >59)
GFR calc non Af Amer: 64 mL/min/{1.73_m2} (ref >59)
Glucose: 79 mg/dL (ref 65–99)
Potassium: 3.4 mmol/L — ABNORMAL LOW (ref 3.5–5.2)
Sodium: 138 mmol/L (ref 134–144)

## 2018-09-17 MED ORDER — POTASSIUM CHLORIDE ER 10 MEQ PO TBCR: 10.0000 meq | EXTENDED_RELEASE_TABLET | Freq: Every day | ORAL | 1 refills | Status: DC

## 2018-09-17 MED FILL — $LANTUS 100 UNITS/ML VIAL: 100 | 84 days supply | Qty: 30 | Fill #0

## 2018-09-17 MED FILL — POTASSIUM CHLORIDE ER 10 ME: 10 | 30 days supply | Qty: 30 | Fill #0

## 2018-09-17 MED FILL — metFORMIN HCL 1000 MG TABS: 1000 | 30 days supply | Qty: 60 | Fill #0

## 2018-09-17 MED FILL — ROSUVASTATIN CALCIUM 20 MG: 20 | 30 days supply | Qty: 30 | Fill #0

## 2018-09-17 MED FILL — METOPROLOL TARTRATE 25 MG T: 25 | 30 days supply | Qty: 60 | Fill #1

## 2018-09-17 MED FILL — AMLODIPINE BESYLATE 5 MG TA: 5 | 30 days supply | Qty: 30 | Fill #1

## 2018-09-17 MED FILL — OMEPRAZOLE 20 MG CAP: 20 | 30 days supply | Qty: 30 | Fill #1

## 2018-09-17 NOTE — Addendum Note (Signed)
Addended by: Elsie Stain on: 09/17/2018 08:49 AM   Modules accepted: Orders

## 2018-09-24 ENCOUNTER — Ambulatory Visit (HOSPITAL_COMMUNITY)
Admission: RE | Admit: 2018-09-24 | Discharge: 2018-09-24 | Disposition: A | Discharge status: Home / Self Care | Payer: Medicaid Other | Source: Ambulatory Visit | Attending: Critical Care Medicine | Admitting: Critical Care Medicine

## 2018-09-24 DIAGNOSIS — M329 Systemic lupus erythematosus, unspecified: Secondary | ICD-10-CM | POA: Insufficient documentation

## 2018-09-24 DIAGNOSIS — R059 Cough, unspecified: Secondary | ICD-10-CM

## 2018-09-24 DIAGNOSIS — R05 Cough: Secondary | ICD-10-CM | POA: Insufficient documentation

## 2018-09-24 MED FILL — FUROSEMIDE 40 MG TAB: 40 | 30 days supply | Qty: 60 | Fill #2

## 2018-09-25 ENCOUNTER — Telehealth: Payer: Self-pay | Admitting: Critical Care Medicine

## 2018-09-25 DIAGNOSIS — J8410 Pulmonary fibrosis, unspecified: Secondary | ICD-10-CM

## 2018-09-25 NOTE — Telephone Encounter (Signed)
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  I am referring her to lung fibrosis specialist at [facility].

## 2018-09-29 NOTE — Telephone Encounter (Signed)
Noted  

## 2018-10-03 ENCOUNTER — Ambulatory Visit: Payer: Medicaid Other | Admitting: Nurse Practitioner

## 2018-10-07 ENCOUNTER — Ambulatory Visit (INDEPENDENT_AMBULATORY_CARE_PROVIDER_SITE_OTHER): Payer: Medicaid Other

## 2018-10-07 ENCOUNTER — Telehealth: Payer: Self-pay | Admitting: Obstetrics and Gynecology

## 2018-10-07 VITALS — BP 153/68 | HR 98 | Wt 230.4 lb

## 2018-10-07 DIAGNOSIS — Z3042 Encounter for surveillance of injectable contraceptive: Secondary | ICD-10-CM

## 2018-10-07 MED ORDER — MEDROXYPROGESTERONE ACETATE 150 MG/ML IM SUSP
150.0000 mg | Freq: Once | INTRAMUSCULAR | Status: AC
  Administered 2018-10-07: 150 mg via INTRAMUSCULAR

## 2018-10-07 NOTE — Telephone Encounter (Signed)
The patient called in to schedule depo appointment.

## 2018-10-07 NOTE — Progress Notes (Signed)
Cheral Almas here for Depo-Provera  Injection.  Injection administered without complication. Patient will return in 3 months for next injection.  Verdell Carmine, RN 10/07/2018  4:40 PM

## 2018-10-15 ENCOUNTER — Ambulatory Visit: Payer: Medicaid Other | Admitting: Nurse Practitioner

## 2018-10-20 ENCOUNTER — Other Ambulatory Visit: Payer: Self-pay | Admitting: Cardiology

## 2018-10-20 DIAGNOSIS — I3139 Other pericardial effusion (noninflammatory): Secondary | ICD-10-CM

## 2018-10-20 DIAGNOSIS — I350 Nonrheumatic aortic (valve) stenosis: Secondary | ICD-10-CM

## 2018-10-20 DIAGNOSIS — I313 Pericardial effusion (noninflammatory): Secondary | ICD-10-CM

## 2018-10-21 MED FILL — ROSUVASTATIN CALCIUM 20 MG: 20 | 30 days supply | Qty: 30 | Fill #1

## 2018-10-21 MED FILL — FUROSEMIDE 40 MG TAB: 40 | 30 days supply | Qty: 60 | Fill #3

## 2018-10-21 MED FILL — METOPROLOL TARTRATE 25 MG T: 25 | 30 days supply | Qty: 60 | Fill #2

## 2018-10-21 MED FILL — POTASSIUM CHLORIDE ER 10 ME: 10 | 30 days supply | Qty: 30 | Fill #1

## 2018-10-21 MED FILL — AMLODIPINE BESYLATE 5 MG TA: 5 | 30 days supply | Qty: 30 | Fill #2

## 2018-10-22 MED FILL — TRUEplus LANCETS 28G MISC: 30 days supply | Qty: 100 | Fill #1

## 2018-10-22 MED FILL — TRUE METRIX TEST STRIP: 30 days supply | Qty: 100 | Fill #2

## 2018-10-23 MED FILL — ?METFORMIN HCL 1000 MG TAB: 1000 | 30 days supply | Qty: 60 | Fill #1

## 2018-10-30 MED FILL — azaTHIOprine 50 MG TABS: 50 | 30 days supply | Qty: 60 | Fill #0

## 2018-10-30 MED FILL — HYDROXYCHLOROQUINE SULFATE: 200 | 30 days supply | Qty: 60 | Fill #0

## 2018-11-13 ENCOUNTER — Institutional Professional Consult (permissible substitution): Payer: Medicaid Other | Admitting: Internal Medicine

## 2018-11-17 ENCOUNTER — Ambulatory Visit: Payer: Self-pay | Attending: Nurse Practitioner | Admitting: Nurse Practitioner

## 2018-11-17 ENCOUNTER — Encounter: Payer: Self-pay | Admitting: Nurse Practitioner

## 2018-11-17 ENCOUNTER — Other Ambulatory Visit: Payer: Self-pay

## 2018-11-17 DIAGNOSIS — R053 Chronic cough: Secondary | ICD-10-CM

## 2018-11-17 DIAGNOSIS — R0602 Shortness of breath: Secondary | ICD-10-CM

## 2018-11-17 DIAGNOSIS — R05 Cough: Secondary | ICD-10-CM

## 2018-11-17 DIAGNOSIS — M329 Systemic lupus erythematosus, unspecified: Secondary | ICD-10-CM

## 2018-11-17 MED ORDER — ALBUTEROL SULFATE HFA 108 (90 BASE) MCG/ACT IN AERS: 2.0000 | INHALATION_SPRAY | Freq: Four times a day (QID) | RESPIRATORY_TRACT | 2 refills | Status: DC | PRN

## 2018-11-17 MED ORDER — PREDNISONE 20 MG PO TABS: 20.0000 mg | ORAL_TABLET | Freq: Every day | ORAL | 0 refills | Status: AC

## 2018-11-17 MED ORDER — CETIRIZINE HCL 10 MG PO TABS: 10.0000 mg | ORAL_TABLET | Freq: Every day | ORAL | 1 refills | Status: DC

## 2018-11-17 NOTE — Progress Notes (Signed)
Assessment & Plan:  [name] was seen today for cough.  Diagnoses and all orders for this visit:  Shortness of breath -     cetirizine (ZYRTEC) 10 MG tablet; Take 1 tablet (10 mg total) by mouth daily. -     albuterol (PROVENTIL HFA;VENTOLIN HFA) 108 (90 Base) MCG/ACT inhaler; Inhale 2 puffs into the lungs every 6 (six) hours as needed for wheezing or shortness of breath. -     predniSONE (DELTASONE) 20 MG tablet; Take 1 tablet (20 mg total) by mouth daily with breakfast for 7 days.  Cough, persistent -     cetirizine (ZYRTEC) 10 MG tablet; Take 1 tablet (10 mg total) by mouth daily. -     albuterol (PROVENTIL HFA;VENTOLIN HFA) 108 (90 Base) MCG/ACT inhaler; Inhale 2 puffs into the lungs every 6 (six) hours as needed for wheezing or shortness of breath.  Lupus (HCC) -     predniSONE (DELTASONE) 20 MG tablet; Take 1 tablet (20 mg total) by mouth daily with breakfast for 7 days.    Patient has been counseled on age-appropriate routine health concerns for screening and prevention. These are reviewed and up-to-date. Referrals have been placed accordingly. Immunizations are up-to-date or declined.    Subjective:   Chief Complaint  Patient presents with  . Cough    Pt. stated she have a cough with shortness of breath a week. No Phglem.    HPI [name] 43 y.o. female presents for telehealth visit. She has complaints today of cold chills,  Worsening non productive cough and worsening shortness of breath x 1 week. She denies BLE edema and fever. She also feels she is having an exacerbation of her lupus and she also reports a worsening skin rash on her face similar to a butterfly rash. I have instructed her to call her rheumatologist to see if she can be scheduled for an appointment. She has not been using her Proventil inhaler and states it was never refilled. She endorses medication compliance taking Imuran and Plaquenil.     Review of Systems  Constitutional: Negative for fever,  malaise/fatigue and weight loss.  HENT: Negative.  Negative for nosebleeds.   Eyes: Negative.  Negative for blurred vision, double vision and photophobia.  Respiratory: Positive for cough and shortness of breath. Negative for hemoptysis and sputum production.   Cardiovascular: Negative.  Negative for chest pain, palpitations and leg swelling.  Gastrointestinal: Negative.  Negative for heartburn, nausea and vomiting.  Musculoskeletal: Negative.  Negative for myalgias.  Skin: Positive for rash. Negative for itching.  Neurological: Negative.  Negative for dizziness, focal weakness, seizures and headaches.  Psychiatric/Behavioral: Negative.  Negative for suicidal ideas.    Past Medical History:  Diagnosis Date  . Abnormal Pap smear of cervix    colpo, HPV  . Allergy   . Anemia   . CHF (congestive heart failure) ([place])   . Depression    after losses  . Enlarged heart    managed by cardiology  . Fibroid   . Gestational diabetes   . Heart murmur   . Hypertension   . Infection    UTI  . Lupus ([place]) 1999    Past Surgical History:  Procedure Laterality Date  . A-FLUTTER ABLATION N/A 06/10/2018   Procedure: A-FLUTTER ABLATION;  Surgeon: Evans Lance, MD;  Location: [facility];  Service: Cardiovascular;  Laterality: N/A;  . RIGHT/LEFT HEART CATH AND CORONARY ANGIOGRAPHY N/A 03/14/2018   Procedure: RIGHT/LEFT HEART CATH AND  CORONARY ANGIOGRAPHY;  Surgeon: [name], MD;  Location: [facility];  Service: Cardiovascular;  Laterality: N/A;  . THERAPEUTIC ABORTION      Family History  Problem Relation Age of Onset  . Cancer Maternal Grandfather        lung  . Diabetes Mother   . Hypertension Maternal Grandmother   . Diabetes Father   . Hearing loss Neg Hx     Social History Reviewed with no changes to be made today.   Outpatient Medications Prior to Visit  Medication Sig Dispense Refill  . acetaminophen (TYLENOL) 325 MG tablet Take 2 tablets (650 mg  total) by mouth every 4 (four) hours as needed for headache or mild pain.    Marland Kitchen amLODipine (NORVASC) 5 MG tablet Take 1 tablet (5 mg total) by mouth daily. 30 tablet 4  . aspirin EC 81 MG tablet Take 81 mg by mouth daily.    Marland Kitchen azaTHIOprine (IMURAN) 50 MG tablet Take 50 mg by mouth 2 (two) times daily.    . benzonatate (TESSALON) 200 MG capsule Take 1 capsule (200 mg total) by mouth 3 (three) times daily as needed for cough. 30 capsule 0  . Blood Glucose Monitoring Suppl (TRUE METRIX METER) w/Device KIT Check blood sugars fasting and at bedtime 1 kit 0  . furosemide (LASIX) 40 MG tablet Take 1 tablet (40 mg total) by mouth 2 (two) times daily. 60 tablet 3  . glucose blood (TRUE METRIX BLOOD GLUCOSE TEST) test strip Use as instructed 100 each 12  . hydroxychloroquine (PLAQUENIL) 200 MG tablet Take 1 tablet (200 mg total) by mouth 2 (two) times daily. 60 tablet 4  . insulin glargine (LANTUS) 100 UNIT/ML injection Inject 0.08 mLs (8 Units total) into the skin daily before breakfast. 10 mL 3  . metFORMIN (GLUCOPHAGE) 1000 MG tablet Take 1 tablet (1,000 mg total) by mouth 2 (two) times daily with a meal. 60 tablet 3  . metoprolol tartrate (LOPRESSOR) 25 MG tablet Take 1 tablet (25 mg total) by mouth 2 (two) times daily. 60 tablet 4  . omeprazole (PRILOSEC) 20 MG capsule Take 1 capsule (20 mg total) by mouth daily as needed (for heartburn or GERD-like symptoms). 30 capsule 3  . potassium chloride (K-DUR) 10 MEQ tablet Take 1 tablet (10 mEq total) by mouth daily. 30 tablet 1  . rosuvastatin (CRESTOR) 20 MG tablet Take 1 tablet (20 mg total) by mouth daily at 6 PM. 30 tablet 4  . TRUEPLUS LANCETS 28G MISC Check blood sugars as directed 100 each 1   No facility-administered medications prior to visit.     Allergies  Allergen Reactions  . Carrot Oil Shortness Of Breath, Itching, Rash and Other (See Comments)    Bumps on tongue, also  . Celery Oil Shortness Of Breath, Itching, Rash and Other (See  Comments)    Bumps on tongue, also  . Peanut-Containing Drug Products Anaphylaxis, Shortness Of Breath, Itching, Rash and Other (See Comments)    Bumps on tongue, also  . Septra [Bactrim] Hives       Objective:    There were no vitals taken for this visit. Wt Readings from Last 3 Encounters:  10/07/18 230 lb 6.4 oz (104.5 kg)  09/16/18 233 lb (105.7 kg)  08/27/18 237 lb 14 oz (107.9 kg)        Patient has been counseled extensively about nutrition and exercise as well as the importance of adherence with medications and regular follow-up. The patient  was given clear instructions to go to ER or return to medical center if symptoms don't improve, worsen or new problems develop. The patient verbalized understanding.   Follow-up: Return in about 2 months (around 01/17/2019).   Gildardo Pounds, FNP-BC [facility] [place], [place]   11/17/2018, 1:40 PM

## 2018-11-19 ENCOUNTER — Other Ambulatory Visit: Payer: Self-pay

## 2018-11-19 ENCOUNTER — Ambulatory Visit (INDEPENDENT_AMBULATORY_CARE_PROVIDER_SITE_OTHER): Payer: 59 | Admitting: Cardiology

## 2018-11-19 ENCOUNTER — Encounter: Payer: Self-pay | Admitting: Cardiology

## 2018-11-19 VITALS — Ht 60.0 in | Wt 230.0 lb

## 2018-11-19 DIAGNOSIS — I350 Nonrheumatic aortic (valve) stenosis: Secondary | ICD-10-CM

## 2018-11-19 DIAGNOSIS — I3139 Other pericardial effusion (noninflammatory): Secondary | ICD-10-CM

## 2018-11-19 DIAGNOSIS — R0602 Shortness of breath: Secondary | ICD-10-CM

## 2018-11-19 DIAGNOSIS — I313 Pericardial effusion (noninflammatory): Secondary | ICD-10-CM | POA: Diagnosis not present

## 2018-11-19 DIAGNOSIS — M329 Systemic lupus erythematosus, unspecified: Secondary | ICD-10-CM | POA: Diagnosis not present

## 2018-11-19 NOTE — Progress Notes (Signed)
Virtual Visit via Video Note   Subjective:   [name], female    DOB: 01-May-1976, 43 y.o.   MRN: [id number]   I connected with the patient on 11/19/18 by a video enabled telemedicine application and verified that I am speaking with the correct person using two identifiers.     I discussed the limitations of evaluation and management by telemedicine and the availability of in person appointments. The patient expressed understanding and agreed to proceed.   This visit type was conducted due to national recommendations for restrictions regarding the COVID-19 Pandemic (e.g. social distancing).  This format is felt to be most appropriate for this patient at this time.  All issues noted in this document were discussed and addressed.  No physical exam was performed (except for noted visual exam findings with Tele health visits).  The patient has consented to conduct a Tele health visit and understands insurance will be billed.     Chief complaint:  Shortness of breath  HPI  43 year old female with long-standing SLE, hypertension, type 2 diabetes mellitus, nonobstructive CAD, moderate aortic stenosis, recurrent pericardial effusion without tampoande physiology (cath 03/2018), recurrent atrial flutter, status post ablation 05/2018, here for follow-up.  At last visit in 08/2018, I had continued her lasix 40 mg bid, and only take metolazone as needed. She had been off sotalol and maitaining sinus rhythm. She had been off Eliquis after no recurrence was noted after ablation. I increased metoprolol succinate to 25 mg daily. 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 I recommended echocardiogram in 01/2019 to monitor he rmoderate aortic stenosis.   She is here for 3 month follow up through a virtual visit. Patient has had worsening shortness of breath and cough symptoms for the past few weeks. She has also had increased  swelling of her hands, and rash on her face. She was started on prednisone 20 mg daily for 7 days for her lupus flare. She denies any chest pain, leg edema. She denies any fever. She has been at home, and has not had any contact with a known COVID patient.   Of note, her colchicine was stopped in March by her PCP. She underwent CT scan due to cough and rales on exam, back in 09/2018. This showed findings concerning for interstitial lung disease. She was referred to pulmonology for further evaluation, however her appt was cancelled in light of COVID outbreak.    Past Medical History:  Diagnosis Date  . Abnormal Pap smear of cervix    colpo, HPV  . Allergy   . Anemia   . CHF (congestive heart failure) ([place])   . Depression    after losses  . Enlarged heart    managed by cardiology  . Fibroid   . Gestational diabetes   . Heart murmur   . Hypertension   . Infection    UTI  . Lupus ([place]) 1999     Past Surgical History:  Procedure Laterality Date  . A-FLUTTER ABLATION N/A 06/10/2018   Procedure: A-FLUTTER ABLATION;  Surgeon: Evans Lance, MD;  Location: [facility];  Service: Cardiovascular;  Laterality: N/A;  . RIGHT/LEFT HEART CATH AND CORONARY ANGIOGRAPHY N/A 03/14/2018   Procedure: RIGHT/LEFT HEART CATH AND CORONARY ANGIOGRAPHY;  Surgeon: [name], MD;  Location: [facility];  Service: Cardiovascular;  Laterality: N/A;  . THERAPEUTIC ABORTION       Social History  Socioeconomic History  . Marital status: Married    Spouse name: Not on file  . Number of children: Not on file  . Years of education: Not on file  . Highest education level: Not on file  Occupational History  . Not on file  Social Needs  . Financial resource strain: Not on file  . Food insecurity:    Worry: Patient refused    Inability: Patient refused  . Transportation needs:    Medical: Patient refused    Non-medical: Patient refused  Tobacco Use  . Smoking status: Never  Smoker  . Smokeless tobacco: Never Used  Substance and Sexual Activity  . Alcohol use: Yes    Comment: ocasionally   . Drug use: No  . Sexual activity: Yes    Birth control/protection: Injection  Lifestyle  . Physical activity:    Days per week: Patient refused    Minutes per session: Patient refused  . Stress: Not on file  Relationships  . Social connections:    Talks on phone: Patient refused    Gets together: Patient refused    Attends religious service: Patient refused    Active member of club or organization: Patient refused    Attends meetings of clubs or organizations: Patient refused    Relationship status: Patient refused  . Intimate partner violence:    Fear of current or ex partner: Patient refused    Emotionally abused: Patient refused    Physically abused: Patient refused    Forced sexual activity: Patient refused  Other Topics Concern  . Not on file  Social History Narrative  . Not on file     Current Outpatient Medications on File Prior to Visit  Medication Sig Dispense Refill  . acetaminophen (TYLENOL) 325 MG tablet Take 2 tablets (650 mg total) by mouth every 4 (four) hours as needed for headache or mild pain.    Marland Kitchen albuterol (PROVENTIL HFA;VENTOLIN HFA) 108 (90 Base) MCG/ACT inhaler Inhale 2 puffs into the lungs every 6 (six) hours as needed for wheezing or shortness of breath. 1 Inhaler 2  . amLODipine (NORVASC) 5 MG tablet Take 1 tablet (5 mg total) by mouth daily. 30 tablet 4  . aspirin EC 81 MG tablet Take 81 mg by mouth daily.    Marland Kitchen azaTHIOprine (IMURAN) 50 MG tablet Take 50 mg by mouth 2 (two) times daily.    . benzonatate (TESSALON) 200 MG capsule Take 1 capsule (200 mg total) by mouth 3 (three) times daily as needed for cough. 30 capsule 0  . Blood Glucose Monitoring Suppl (TRUE METRIX METER) w/Device KIT Check blood sugars fasting and at bedtime 1 kit 0  . cetirizine (ZYRTEC) 10 MG tablet Take 1 tablet (10 mg total) by mouth daily. 90 tablet 1  .  furosemide (LASIX) 40 MG tablet Take 1 tablet (40 mg total) by mouth 2 (two) times daily. 60 tablet 3  . glucose blood (TRUE METRIX BLOOD GLUCOSE TEST) test strip Use as instructed 100 each 12  . hydroxychloroquine (PLAQUENIL) 200 MG tablet Take 1 tablet (200 mg total) by mouth 2 (two) times daily. 60 tablet 4  . insulin glargine (LANTUS) 100 UNIT/ML injection Inject 0.08 mLs (8 Units total) into the skin daily before breakfast. 10 mL 3  . metFORMIN (GLUCOPHAGE) 1000 MG tablet Take 1 tablet (1,000 mg total) by mouth 2 (two) times daily with a meal. 60 tablet 3  . metoprolol tartrate (LOPRESSOR) 25 MG tablet Take 1 tablet (25 mg total) by  mouth 2 (two) times daily. 60 tablet 4  . omeprazole (PRILOSEC) 20 MG capsule Take 1 capsule (20 mg total) by mouth daily as needed (for heartburn or GERD-like symptoms). 30 capsule 3  . potassium chloride (K-DUR) 10 MEQ tablet Take 1 tablet (10 mEq total) by mouth daily. 30 tablet 1  . predniSONE (DELTASONE) 20 MG tablet Take 1 tablet (20 mg total) by mouth daily with breakfast for 7 days. 7 tablet 0  . rosuvastatin (CRESTOR) 20 MG tablet Take 1 tablet (20 mg total) by mouth daily at 6 PM. 30 tablet 4  . TRUEPLUS LANCETS 28G MISC Check blood sugars as directed 100 each 1   No current facility-administered medications on file prior to visit.     Cardiovascular studies:  CT Chest 09/24/2018: 1. 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 No frank honeycombing. Differential includes usual interstitial pneumonia (UIP) or fibrotic phase nonspecific interstitial pneumonia (NSIP). Findings are categorized as probable UIP per consensus guidelines: Diagnosis of Idiopathic Pulmonary Fibrosis: An Official ATS/ERS/JRS/ALAT Clinical Practice Guideline. [place], Iss 5, [phone], Apr 13 2017. 2. Moderate pericardial effusion. 3. Three-vessel  coronary atherosclerosis. 4. Chronic stable bilateral axillary and mediastinal lymphadenopathy, most compatible with benign etiology.  Echocardiogram 08/12/2018: 1. Left ventricle cavity is normal in size. Moderate concentric hypertrophy of the left ventricle. Normal global wall motion. Doppler evidence of grade II (pseudonormal) diastolic dysfunction. Calculated EF 56%. 2. Left atrial cavity is severely dilated. 3. Mild (Grade I) aortic regurgitation. Mild aortic valve leaflet calcification. Moderately restricted aortic valve leaflets. Mild to Moderate aortic valve stenosis with peak gradient 45.6, mean gradient 23.7 mm of Hg . Calculated valve area is 1.26 cm2. 4. Trace mitral regurgitation. Mild calcification of the mitral valve annulus. 5. Trace tricuspid regurgitation. 6. Moderate pericardial effusion (more marked in post. and lateral part) with clear fluids. No signs of tamponade. 7. c.f. echo. of 01/02/2018, aortic stenosis appears moderate (it was mild before), mild AR is new, no other diagnostic change.  R + LHC 03/14/2018: Prox LAD 50% stenosis Bordelrine pulmonary hypertension No tampoande phsyiology  Cardiac MRI 03/13/2018: 1. Normal LV size and function EF 65% moderate LVH 2. Moderate to Large circumferential pericardial effusion biggest in the posterior and lateral dimensions with no diastolic RV collapse 3.  Normal appearing pericardium 4. Mild subepicardial gadolinium uptake in the basal and mid inferior wall and basal antero septum consistent with mild myocarditis 5. Tri leaflet AV with some restricted motion suggest echo correlation for degree of AS 6.  Severe LAE  Recent labs: Results for ALLYN, BARTELSON (MRN [id number]) as of 11/19/2018 11:21  Ref. Range 09/16/2018 32:20  BASIC METABOLIC PANEL Unknown Rpt (A)  Sodium Latest Ref Range: 134 - 144 mmol/L 138  Potassium Latest Ref Range: 3.5 - 5.2 mmol/L 3.4 (L)  Chloride Latest Ref Range: 96 - 106 mmol/L  97  CO2 Latest Ref Range: 20 - 29 mmol/L 25  Glucose Latest Ref Range: 65 - 99 mg/dL 79  BUN Latest Ref Range: 6 - 24 mg/dL 21  Creatinine Latest Ref Range: 0.57 - 1.00 mg/dL 1.07 (H)  Calcium Latest Ref Range: 8.7 - 10.2 mg/dL 9.2  BUN/Creatinine Ratio Latest Ref Range: 9 - 23  20  GFR, Est Non African American Latest Ref Range: >59 mL/min/1.73 64  GFR, Est African American Latest Ref Range: >59 mL/min/1.73 73   Results for Mastrogiovanni,  LEOCADIA IDLEMAN (MRN [id number]) as of 11/19/2018 11:21  Ref. Range 08/26/2018 03:28  WBC Latest Ref Range: 4.0 - 10.5 K/uL 4.2  RBC Latest Ref Range: 3.87 - 5.11 MIL/uL 4.10  Hemoglobin Latest Ref Range: 12.0 - 15.0 g/dL 10.0 (L)  HCT Latest Ref Range: 36.0 - 46.0 % 31.5 (L)  MCV Latest Ref Range: 80.0 - 100.0 fL 76.8 (L)  MCH Latest Ref Range: 26.0 - 34.0 pg 24.4 (L)  MCHC Latest Ref Range: 30.0 - 36.0 g/dL 31.7  RDW Latest Ref Range: 11.5 - 15.5 % 13.5  Platelets Latest Ref Range: 150 - 400 K/uL 293  nRBC Latest Ref Range: 0.0 - 0.2 % 0.0    Review of Systems  Constitution: Negative for decreased appetite, malaise/fatigue, weight gain and weight loss.  HENT: Negative for congestion.   Eyes: Negative for visual disturbance.  Cardiovascular: Positive for dyspnea on exertion. Negative for chest pain, leg swelling, palpitations and syncope.  Respiratory: Positive for cough and shortness of breath.   Endocrine: Negative for cold intolerance.  Hematologic/Lymphatic: Does not bruise/bleed easily.  Skin: Negative for itching and rash.  Musculoskeletal: Negative for myalgias.  Gastrointestinal: Negative for abdominal pain, nausea and vomiting.  Genitourinary: Negative for dysuria.  Neurological: Negative for dizziness and weakness.  Psychiatric/Behavioral: The patient is not nervous/anxious.   All other systems reviewed and are negative.        There were no vitals filed for this visit. (Measured by the patient using a home BP  monitor)   Observation/findings during video visit   Objective:   Physical Exam  Constitutional: She is oriented to person, place, and time. She appears well-developed and well-nourished. No distress.  HENT:  Malar rash  Neck: No JVD present.  Pulmonary/Chest:  Short of breath while talking  Neurological: She is alert and oriented to person, place, and time.  Psychiatric: She has a normal mood and affect.  Nursing note and vitals reviewed.         Assessment & Recommendations:   43 year old female with long-standing SLE, hypertension, type 2 diabetes mellitus, nonobstructive CAD, moderate aortic stenosis, recurrent pericardial effusion without tampoande physiology (cath 03/2018), recurrent atrial flutter, now status post ablation 05/2018  Shortness of breath: Differential includes interstitial lung disease, worsening aortic stenosis, or pericardial effusion in the setting of lupus flare. Given her symptoms. I would like to obtain stat echocardiogram to evaluate the latter two possibilities. We will take maximum precautions to limit her social contact during this visit. Sonographer will take extra precautions to avoid any cross contamination.   Given her h/o recurrent pericardial effusion, she may need to go back on colchicine. Also, longer duration and slow taper of prednisone may need to be considered.   Interstitial lung disease likely the most possible reason for her symptoms. I have asked her to check with her pulmonologist if she could have a telemedicine visit.    Pericardial effusion: As above  CAD: Prox LAD 50% lesion. Continue risk factor modification with blood pressure control, aspirin and crestor 20 mg.  Moderate aortic stenosis: Aortic stenosis of trileaflet valve at the age, also likely related to her systemic lupus. Continue serial monitoring with echocardiogram. Repeat echocardiogram tomorrow.  Hypertension: Controlled.  Type 2 DM: Continue current  management per PCP, along with Jardiance.  Lupus: Follow up with rheumatology.  I will see her in person after the echocardiogram tomorrow.   [name], MD [facility] Pager: [phone] Office: [phone] If no answer Cell [phone]

## 2018-11-20 ENCOUNTER — Encounter: Payer: Self-pay | Admitting: Cardiology

## 2018-11-20 ENCOUNTER — Ambulatory Visit: Payer: 59

## 2018-11-20 ENCOUNTER — Other Ambulatory Visit: Payer: Self-pay | Admitting: Nurse Practitioner

## 2018-11-20 ENCOUNTER — Other Ambulatory Visit: Payer: Self-pay

## 2018-11-20 ENCOUNTER — Other Ambulatory Visit: Payer: Self-pay | Admitting: Critical Care Medicine

## 2018-11-20 ENCOUNTER — Ambulatory Visit (INDEPENDENT_AMBULATORY_CARE_PROVIDER_SITE_OTHER): Payer: 59 | Admitting: Cardiology

## 2018-11-20 VITALS — BP 135/92 | HR 90 | Ht 60.0 in | Wt 228.0 lb

## 2018-11-20 DIAGNOSIS — I313 Pericardial effusion (noninflammatory): Secondary | ICD-10-CM

## 2018-11-20 DIAGNOSIS — I10 Essential (primary) hypertension: Secondary | ICD-10-CM

## 2018-11-20 DIAGNOSIS — R0602 Shortness of breath: Secondary | ICD-10-CM | POA: Diagnosis not present

## 2018-11-20 DIAGNOSIS — I3139 Other pericardial effusion (noninflammatory): Secondary | ICD-10-CM

## 2018-11-20 DIAGNOSIS — I350 Nonrheumatic aortic (valve) stenosis: Secondary | ICD-10-CM

## 2018-11-20 MED ORDER — COLCHICINE 0.6 MG PO TABS: 0.6000 mg | ORAL_TABLET | Freq: Every day | ORAL | 2 refills | Status: DC

## 2018-11-20 MED FILL — ?FUROSEMIDE 40 MG TABLET: 40 | 30 days supply | Qty: 60 | Fill #0

## 2018-11-20 MED FILL — ?AMLODIPINE BESYLATE 5MG TA: 5 | 30 days supply | Qty: 30 | Fill #3

## 2018-11-20 MED FILL — ?OMEPRAZOLE 20 MG CAPSULE D: 20 | 30 days supply | Qty: 30 | Fill #2

## 2018-11-20 MED FILL — ?METOPROLOL 25 MG TABLET: 25 | 30 days supply | Qty: 60 | Fill #3

## 2018-11-20 MED FILL — ?COLCHICINE 0.6 MG CAPS: 0.6 | 30 days supply | Qty: 30 | Fill #0

## 2018-11-20 MED FILL — ?ROSUVASTATIN CALCIUM 20MG: 20 | 30 days supply | Qty: 30 | Fill #2

## 2018-11-20 NOTE — Progress Notes (Signed)
Subjective:   [name], female    DOB: [birth year], 43 y.o.   MRN: [id number]   Chief complaint:  Shortness of breath  HPI  43 year old female with long-standing SLE, hypertension, type 2 diabetes mellitus, nonobstructive CAD, moderate aortic stenosis, recurrent pericardial effusion without tampoande physiology (cath 03/2018), recurrent atrial flutter, status post ablation 05/2018.  I had a virtual visit with the patient on 11/20/2018. She had had increasing shortness of breath over the pat week or two. I ordered an echocardiogram to exclude cardiac tamponade or worsening aortic stenosis.  I saw her today to discuss the results of her echocardiogram. Echocardiogram does not show any significant change in her pericardial effusion-without tamponade physiology, and unchanged moderate aortic stenosis.   She is currently taking prednisone 20 mg daily for lupus flare. Her rash has improved, and so has her shortness of breath.    Past Medical History:  Diagnosis Date  . Abnormal Pap smear of cervix    colpo, HPV  . Allergy   . Anemia   . CHF (congestive heart failure) ([place])   . Depression    after losses  . Enlarged heart    managed by cardiology  . Fibroid   . Gestational diabetes   . Heart murmur   . Hypertension   . Infection    UTI  . Lupus ([place]) 1999     Past Surgical History:  Procedure Laterality Date  . A-FLUTTER ABLATION N/A 06/10/2018   Procedure: A-FLUTTER ABLATION;  Surgeon: Evans Lance, MD;  Location: [facility];  Service: Cardiovascular;  Laterality: N/A;  . RIGHT/LEFT HEART CATH AND CORONARY ANGIOGRAPHY N/A 03/14/2018   Procedure: RIGHT/LEFT HEART CATH AND CORONARY ANGIOGRAPHY;  Surgeon: [name], MD;  Location: [facility];  Service: Cardiovascular;  Laterality: N/A;  . THERAPEUTIC ABORTION       Social History   Socioeconomic History  . Marital status: Married    Spouse name: Not on file  . Number of children: 2   . Years of education: Not on file  . Highest education level: Not on file  Occupational History  . Not on file  Social Needs  . Financial resource strain: Not on file  . Food insecurity:    Worry: Patient refused    Inability: Patient refused  . Transportation needs:    Medical: Patient refused    Non-medical: Patient refused  Tobacco Use  . Smoking status: Never Smoker  . Smokeless tobacco: Never Used  Substance and Sexual Activity  . Alcohol use: Yes    Comment: ocasionally   . Drug use: No  . Sexual activity: Yes    Birth control/protection: Injection  Lifestyle  . Physical activity:    Days per week: Patient refused    Minutes per session: Patient refused  . Stress: Not on file  Relationships  . Social connections:    Talks on phone: Patient refused    Gets together: Patient refused    Attends religious service: Patient refused    Active member of club or organization: Patient refused    Attends meetings of clubs or organizations: Patient refused    Relationship status: Patient refused  . Intimate partner violence:    Fear of current or ex partner: Patient refused    Emotionally abused: Patient refused    Physically abused: Patient refused    Forced sexual activity: Patient refused  Other Topics Concern  . Not on file  Social History Narrative  .  Not on file     Current Outpatient Medications on File Prior to Visit  Medication Sig Dispense Refill  . acetaminophen (TYLENOL) 325 MG tablet Take 2 tablets (650 mg total) by mouth every 4 (four) hours as needed for headache or mild pain.    Marland Kitchen albuterol (PROVENTIL HFA;VENTOLIN HFA) 108 (90 Base) MCG/ACT inhaler Inhale 2 puffs into the lungs every 6 (six) hours as needed for wheezing or shortness of breath. 1 Inhaler 2  . amLODipine (NORVASC) 5 MG tablet Take 1 tablet (5 mg total) by mouth daily. 30 tablet 4  . aspirin EC 81 MG tablet Take 81 mg by mouth daily.    Marland Kitchen azaTHIOprine (IMURAN) 50 MG tablet Take 50 mg by  mouth 2 (two) times daily.    . benzonatate (TESSALON) 200 MG capsule Take 1 capsule (200 mg total) by mouth 3 (three) times daily as needed for cough. 30 capsule 0  . Blood Glucose Monitoring Suppl (TRUE METRIX METER) w/Device KIT Check blood sugars fasting and at bedtime 1 kit 0  . cetirizine (ZYRTEC) 10 MG tablet Take 1 tablet (10 mg total) by mouth daily. 90 tablet 1  . furosemide (LASIX) 40 MG tablet Take 1 tablet (40 mg total) by mouth 2 (two) times daily. 60 tablet 3  . glucose blood (TRUE METRIX BLOOD GLUCOSE TEST) test strip Use as instructed 100 each 12  . hydroxychloroquine (PLAQUENIL) 200 MG tablet Take 1 tablet (200 mg total) by mouth 2 (two) times daily. 60 tablet 4  . insulin glargine (LANTUS) 100 UNIT/ML injection Inject 0.08 mLs (8 Units total) into the skin daily before breakfast. (Patient taking differently: Inject 16 Units into the skin daily before breakfast. ) 10 mL 3  . metFORMIN (GLUCOPHAGE) 1000 MG tablet Take 1 tablet (1,000 mg total) by mouth 2 (two) times daily with a meal. 60 tablet 3  . metoprolol tartrate (LOPRESSOR) 25 MG tablet Take 1 tablet (25 mg total) by mouth 2 (two) times daily. 60 tablet 4  . omeprazole (PRILOSEC) 20 MG capsule Take 1 capsule (20 mg total) by mouth daily as needed (for heartburn or GERD-like symptoms). (Patient taking differently: Take 20 mg by mouth daily. ) 30 capsule 3  . potassium chloride (K-DUR) 10 MEQ tablet Take 1 tablet (10 mEq total) by mouth daily. 30 tablet 1  . predniSONE (DELTASONE) 20 MG tablet Take 1 tablet (20 mg total) by mouth daily with breakfast for 7 days. 7 tablet 0  . rosuvastatin (CRESTOR) 20 MG tablet Take 1 tablet (20 mg total) by mouth daily at 6 PM. 30 tablet 4  . TRUEPLUS LANCETS 28G MISC Check blood sugars as directed (Patient not taking: Reported on 11/20/2018) 100 each 1   No current facility-administered medications on file prior to visit.     Cardiovascular studies:  EKG 11/20/2018: Sinus tachycardia 104  bpm. Frequent PAC's Nonspecific ST-T abnormality.   Echocardiogram 11/20/2018: Left ventricle cavity is normal in size. Moderate concentric hypertrophy of the left ventricle. Normal global wall motion. Doppler evidence of grade II (pseudonormal) diastolic dysfunction, elevated LAP. Calculated EF 63%. Left atrial cavity is severely dilated. Mild aortic valve leaflet thickening. Moderate aortic valve stenosis. Mild (Grade I) aortic regurgitation. Aortic valve mean gradient of 17 mmHg, Vmax of 2.8 m/s. Calculated aortic valve area by continuity equation is 1.2 cm. Moderate (Grade II) mitral regurgitation. Mild tricuspid regurgitation. Estimated pulmonary artery systolic pressure 25 mmHg. Moderate pericardial effusion with only small posterior loculation that measures 3.2 cm-large. No  hemodynamic compromise. No significant change compared to previous study on 08/12/2018.  CT Chest 09/24/2018: 1. 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 No frank honeycombing. Differential includes usual interstitial pneumonia (UIP) or fibrotic phase nonspecific interstitial pneumonia (NSIP). Findings are categorized as probable UIP per consensus guidelines: Diagnosis of Idiopathic Pulmonary Fibrosis: An Official ATS/ERS/JRS/ALAT Clinical Practice Guideline. [place], Iss 5, [phone], Apr 13 2017. 2. Moderate pericardial effusion. 3. Three-vessel coronary atherosclerosis. 4. Chronic stable bilateral axillary and mediastinal lymphadenopathy, most compatible with benign etiology.  Echocardiogram 08/12/2018: 1. Left ventricle cavity is normal in size. Moderate concentric hypertrophy of the left ventricle. Normal global wall motion. Doppler evidence of grade II (pseudonormal) diastolic dysfunction. Calculated EF 56%. 2. Left atrial cavity is severely dilated. 3. Mild (Grade I) aortic  regurgitation. Mild aortic valve leaflet calcification. Moderately restricted aortic valve leaflets. Mild to Moderate aortic valve stenosis with peak gradient 45.6, mean gradient 23.7 mm of Hg . Calculated valve area is 1.26 cm2. 4. Trace mitral regurgitation. Mild calcification of the mitral valve annulus. 5. Trace tricuspid regurgitation. 6. Moderate pericardial effusion (more marked in post. and lateral part) with clear fluids. No signs of tamponade. 7. c.f. echo. of 01/02/2018, aortic stenosis appears moderate (it was mild before), mild AR is new, no other diagnostic change.  R + LHC 03/14/2018: Prox LAD 50% stenosis Bordelrine pulmonary hypertension No tampoande phsyiology  Cardiac MRI 03/13/2018: 1. Normal LV size and function EF 65% moderate LVH 2. Moderate to Large circumferential pericardial effusion biggest in the posterior and lateral dimensions with no diastolic RV collapse 3.  Normal appearing pericardium 4. Mild subepicardial gadolinium uptake in the basal and mid inferior wall and basal antero septum consistent with mild myocarditis 5. Tri leaflet AV with some restricted motion suggest echo correlation for degree of AS 6.  Severe LAE  Recent labs: Results for AARIYANA, MANZ (MRN [id number]) as of 11/19/2018 11:21  Ref. Range 09/16/2018 25:42  BASIC METABOLIC PANEL Unknown Rpt (A)  Sodium Latest Ref Range: 134 - 144 mmol/L 138  Potassium Latest Ref Range: 3.5 - 5.2 mmol/L 3.4 (L)  Chloride Latest Ref Range: 96 - 106 mmol/L 97  CO2 Latest Ref Range: 20 - 29 mmol/L 25  Glucose Latest Ref Range: 65 - 99 mg/dL 79  BUN Latest Ref Range: 6 - 24 mg/dL 21  Creatinine Latest Ref Range: 0.57 - 1.00 mg/dL 1.07 (H)  Calcium Latest Ref Range: 8.7 - 10.2 mg/dL 9.2  BUN/Creatinine Ratio Latest Ref Range: 9 - 23  20  GFR, Est Non African American Latest Ref Range: >59 mL/min/1.73 64  GFR, Est African American Latest Ref Range: >59 mL/min/1.73 73   Results for DEBORA, STOCKDALE (MRN [id number]) as of 11/19/2018 11:21  Ref. Range 08/26/2018 03:28  WBC Latest Ref Range: 4.0 - 10.5 K/uL 4.2  RBC Latest Ref Range: 3.87 - 5.11 MIL/uL 4.10  Hemoglobin Latest Ref Range: 12.0 - 15.0 g/dL 10.0 (L)  HCT Latest Ref Range: 36.0 - 46.0 % 31.5 (L)  MCV Latest Ref Range: 80.0 - 100.0 fL 76.8 (L)  MCH Latest Ref Range: 26.0 - 34.0 pg 24.4 (L)  MCHC Latest Ref Range: 30.0 - 36.0 g/dL 31.7  RDW Latest Ref Range: 11.5 - 15.5 % 13.5  Platelets Latest Ref Range: 150 - 400 K/uL 293  nRBC Latest Ref Range: 0.0 - 0.2 % 0.0  Review of Systems  Constitution: Negative for decreased appetite, malaise/fatigue, weight gain and weight loss.  HENT: Negative for congestion.   Eyes: Negative for visual disturbance.  Cardiovascular: Positive for dyspnea on exertion. Negative for chest pain, leg swelling, palpitations and syncope.  Respiratory: Positive for cough and shortness of breath.   Endocrine: Negative for cold intolerance.  Hematologic/Lymphatic: Does not bruise/bleed easily.  Skin: Negative for itching and rash.  Musculoskeletal: Negative for myalgias.  Gastrointestinal: Negative for abdominal pain, nausea and vomiting.  Genitourinary: Negative for dysuria.  Neurological: Negative for dizziness and weakness.  Psychiatric/Behavioral: The patient is not nervous/anxious.   All other systems reviewed and are negative.        Vitals:   11/20/18 0933  BP: (!) 135/92  Pulse: 90  SpO2: 98%   Physical Exam  Constitutional: She is oriented to person, place, and time. She appears well-developed and well-nourished. No distress.  HENT:  Head: Normocephalic and atraumatic.  Mild malar rash  Eyes: Pupils are equal, round, and reactive to light. Conjunctivae are normal.  Neck: No JVD present.  Cardiovascular: Normal rate, regular rhythm and intact distal pulses.  Pulmonary/Chest: Effort normal. She has no wheezes. She has rales (Minimal rales b/l).  Abdominal: Soft. Bowel  sounds are normal. There is no rebound.  Musculoskeletal:        General: No edema.  Lymphadenopathy:    She has no cervical adenopathy.  Neurological: She is alert and oriented to person, place, and time. No cranial nerve deficit.  Skin: Skin is warm and dry.  Psychiatric: She has a normal mood and affect.  Nursing note and vitals reviewed.         Assessment & Recommendations:   43 year old female with long-standing SLE, hypertension, type 2 diabetes mellitus, nonobstructive CAD, moderate aortic stenosis, recurrent pericardial effusion without tampoande physiology (cath 03/2018), recurrent atrial flutter, now status post ablation 05/2018  Shortness of breath: While she continues to have pericardial effusion, it is unchanged compared to previous study, and does not show tamponade physiology. Aortic stenosis is moderate and unchanged. She does have grade 2 DD, but appears fairly euvolumic on exam. I suspect her symptoms could be related to the new finding of possible interstitial lung disease on CT chest. She will benefit from pulmonary evaluation. It appears that she was referred for the same by her PCP, but the appointment was cancelled due to [place]. I will initiate referral again from our office, and see if she can be seen virtually by them,  Differential includes interstitial lung disease, worsening aortic stenosis, or pericardial effusion in the setting of lupus flare. Given her symptoms. I would like to obtain stat echocardiogram to evaluate the latter two possibilities. We will take maximum precautions to limit her social contact during this visit. Sonographer will take extra precautions to avoid any cross contamination.   Pericardial effusion: As above. Given her h/o recurrent pericardial effusion, she may need to go back on colchicine. Also, longer duration and slow taper of prednisone may need to be considered. I started colchicine 0.6 mg bid today.  CAD: Prox LAD 50%  lesion. Continue risk factor modification with blood pressure control, aspirin and crestor 20 mg.  Moderate aortic stenosis: Aortic stenosis of trileaflet valve at the age, also likely related to her systemic lupus. Continue serial monitoring with echocardiogram. Repeat echocardiogram in 05/2019.  Hypertension: Controlled.  Type 2 DM: Continue current management per PCP, along with Jardiance.  Lupus: Follow up with rheumatology.  Virtual  visit follow up in 8 weeks.  [name], MD [facility] Pager: [phone] Office: [phone] If no answer Cell [phone]

## 2018-11-26 ENCOUNTER — Ambulatory Visit: Payer: Self-pay | Admitting: Nurse Practitioner

## 2018-11-26 ENCOUNTER — Other Ambulatory Visit: Payer: Self-pay | Admitting: Nurse Practitioner

## 2018-11-26 ENCOUNTER — Encounter: Payer: Self-pay | Admitting: Nurse Practitioner

## 2018-11-26 ENCOUNTER — Other Ambulatory Visit: Payer: Self-pay

## 2018-11-26 DIAGNOSIS — M329 Systemic lupus erythematosus, unspecified: Secondary | ICD-10-CM

## 2018-12-08 ENCOUNTER — Encounter (HOSPITAL_COMMUNITY): Payer: Self-pay | Admitting: *Deleted

## 2018-12-08 ENCOUNTER — Other Ambulatory Visit: Payer: Self-pay

## 2018-12-08 ENCOUNTER — Emergency Department (HOSPITAL_COMMUNITY): Payer: 59

## 2018-12-08 ENCOUNTER — Inpatient Hospital Stay (HOSPITAL_COMMUNITY)
Admission: EM | Admit: 2018-12-08 | Discharge: 2018-12-12 | DRG: 246 | Disposition: A | Discharge status: Home / Self Care | Payer: 59 | Attending: Cardiology | Admitting: Cardiology

## 2018-12-08 DIAGNOSIS — I480 Paroxysmal atrial fibrillation: Principal | ICD-10-CM | POA: Diagnosis present

## 2018-12-08 DIAGNOSIS — Z7982 Long term (current) use of aspirin: Secondary | ICD-10-CM | POA: Diagnosis not present

## 2018-12-08 DIAGNOSIS — I3139 Other pericardial effusion (noninflammatory): Secondary | ICD-10-CM

## 2018-12-08 DIAGNOSIS — Z794 Long term (current) use of insulin: Secondary | ICD-10-CM

## 2018-12-08 DIAGNOSIS — I313 Pericardial effusion (noninflammatory): Secondary | ICD-10-CM | POA: Diagnosis present

## 2018-12-08 DIAGNOSIS — I25118 Atherosclerotic heart disease of native coronary artery with other forms of angina pectoris: Secondary | ICD-10-CM | POA: Diagnosis present

## 2018-12-08 DIAGNOSIS — I429 Cardiomyopathy, unspecified: Secondary | ICD-10-CM | POA: Diagnosis present

## 2018-12-08 DIAGNOSIS — Z91010 Allergy to peanuts: Secondary | ICD-10-CM | POA: Diagnosis not present

## 2018-12-08 DIAGNOSIS — E119 Type 2 diabetes mellitus without complications: Secondary | ICD-10-CM | POA: Diagnosis present

## 2018-12-08 DIAGNOSIS — I080 Rheumatic disorders of both mitral and aortic valves: Secondary | ICD-10-CM | POA: Diagnosis present

## 2018-12-08 DIAGNOSIS — I5033 Acute on chronic diastolic (congestive) heart failure: Secondary | ICD-10-CM | POA: Diagnosis present

## 2018-12-08 DIAGNOSIS — I110 Hypertensive heart disease with heart failure: Secondary | ICD-10-CM | POA: Diagnosis present

## 2018-12-08 DIAGNOSIS — Z91018 Allergy to other foods: Secondary | ICD-10-CM | POA: Diagnosis not present

## 2018-12-08 DIAGNOSIS — J8410 Pulmonary fibrosis, unspecified: Secondary | ICD-10-CM | POA: Diagnosis not present

## 2018-12-08 DIAGNOSIS — Z881 Allergy status to other antibiotic agents status: Secondary | ICD-10-CM

## 2018-12-08 DIAGNOSIS — R0602 Shortness of breath: Secondary | ICD-10-CM | POA: Diagnosis present

## 2018-12-08 DIAGNOSIS — I2510 Atherosclerotic heart disease of native coronary artery without angina pectoris: Secondary | ICD-10-CM | POA: Diagnosis present

## 2018-12-08 DIAGNOSIS — Z833 Family history of diabetes mellitus: Secondary | ICD-10-CM | POA: Diagnosis not present

## 2018-12-08 DIAGNOSIS — M329 Systemic lupus erythematosus, unspecified: Secondary | ICD-10-CM | POA: Diagnosis present

## 2018-12-08 DIAGNOSIS — Z8249 Family history of ischemic heart disease and other diseases of the circulatory system: Secondary | ICD-10-CM | POA: Diagnosis not present

## 2018-12-08 DIAGNOSIS — I4891 Unspecified atrial fibrillation: Secondary | ICD-10-CM

## 2018-12-08 DIAGNOSIS — I4892 Unspecified atrial flutter: Secondary | ICD-10-CM | POA: Diagnosis present

## 2018-12-08 DIAGNOSIS — Z801 Family history of malignant neoplasm of trachea, bronchus and lung: Secondary | ICD-10-CM | POA: Diagnosis not present

## 2018-12-08 HISTORY — DX: Unspecified atrial fibrillation: I48.91

## 2018-12-08 LAB — COMPREHENSIVE METABOLIC PANEL
ALT: 11 U/L (ref 0–44)
AST: 20 U/L (ref 15–41)
Albumin: 3.5 g/dL (ref 3.5–5.0)
Alkaline Phosphatase: 73 U/L (ref 38–126)
Anion gap: 12 (ref 5–15)
BUN: 7 mg/dL (ref 6–20)
CO2: 27 mmol/L (ref 22–32)
Calcium: 9.1 mg/dL (ref 8.9–10.3)
Chloride: 101 mmol/L (ref 98–111)
Creatinine, Ser: 0.85 mg/dL (ref 0.44–1.00)
GFR calc Af Amer: >60 mL/min (ref >60)
GFR calc non Af Amer: >60 mL/min (ref >60)
Glucose, Bld: 156 mg/dL — ABNORMAL HIGH (ref 70–99)
Potassium: 3.1 mmol/L — ABNORMAL LOW (ref 3.5–5.1)
Sodium: 140 mmol/L (ref 135–145)
Total Bilirubin: 0.8 mg/dL (ref 0.3–1.2)
Total Protein: 8.1 g/dL (ref 6.5–8.1)

## 2018-12-08 LAB — CBC WITH DIFFERENTIAL/PLATELET
Abs Immature Granulocytes: 0.01 10*3/uL (ref 0.00–0.07)
Basophils Absolute: 0 10*3/uL (ref 0.0–0.1)
Basophils Relative: 1 %
Eosinophils Absolute: 0.2 10*3/uL (ref 0.0–0.5)
Eosinophils Relative: 2 %
HCT: 37.3 % (ref 36.0–46.0)
Hemoglobin: 12.1 g/dL (ref 12.0–15.0)
Immature Granulocytes: 0 %
Lymphocytes Relative: 27 %
Lymphs Abs: 1.7 10*3/uL (ref 0.7–4.0)
MCH: 24.5 pg — ABNORMAL LOW (ref 26.0–34.0)
MCHC: 32.4 g/dL (ref 30.0–36.0)
MCV: 75.7 fL — ABNORMAL LOW (ref 80.0–100.0)
Monocytes Absolute: 0.8 10*3/uL (ref 0.1–1.0)
Monocytes Relative: 12 %
Neutro Abs: 3.8 10*3/uL (ref 1.7–7.7)
Neutrophils Relative %: 58 %
Platelets: 361 10*3/uL (ref 150–400)
RBC: 4.93 MIL/uL (ref 3.87–5.11)
RDW: 14.7 % (ref 11.5–15.5)
WBC: 6.5 10*3/uL (ref 4.0–10.5)
nRBC: 0 % (ref 0.0–0.2)

## 2018-12-08 LAB — MAGNESIUM: Magnesium: 1.5 mg/dL — ABNORMAL LOW (ref 1.7–2.4)

## 2018-12-08 LAB — GLUCOSE, CAPILLARY: Glucose-Capillary: 150 mg/dL — ABNORMAL HIGH (ref 70–99)

## 2018-12-08 LAB — CK: Total CK: 143 U/L (ref 38–234)

## 2018-12-08 LAB — BRAIN NATRIURETIC PEPTIDE: B Natriuretic Peptide: 351.5 pg/mL — ABNORMAL HIGH (ref 0.0–100.0)

## 2018-12-08 MED ORDER — ROSUVASTATIN CALCIUM 20 MG PO TABS
20.0000 mg | ORAL_TABLET | Freq: Every day | ORAL | Status: DC
  Administered 2018-12-08 – 2018-12-12 (×5): 20 mg via ORAL
  Filled 2018-12-08 (×5): qty 1

## 2018-12-08 MED ORDER — INSULIN ASPART 100 UNIT/ML ~~LOC~~ SOLN
0.0000 [IU] | Freq: Three times a day (TID) | SUBCUTANEOUS | Status: DC
  Administered 2018-12-09: 2 [IU] via SUBCUTANEOUS
  Administered 2018-12-09 – 2018-12-12 (×4): 3 [IU] via SUBCUTANEOUS
  Administered 2018-12-12: 12:00:00 2 [IU] via SUBCUTANEOUS

## 2018-12-08 MED ORDER — ACETAMINOPHEN 500 MG PO TABS: 1000.0000 mg | ORAL_TABLET | Freq: Four times a day (QID) | ORAL | Status: DC | PRN

## 2018-12-08 MED ORDER — MAGNESIUM SULFATE 2 GM/50ML IV SOLN
2.0000 g | Freq: Once | INTRAVENOUS | Status: AC
  Administered 2018-12-08: 2 g via INTRAVENOUS
  Filled 2018-12-08: qty 50

## 2018-12-08 MED ORDER — INSULIN GLARGINE 100 UNIT/ML ~~LOC~~ SOLN
16.0000 [IU] | Freq: Every day | SUBCUTANEOUS | Status: DC
  Administered 2018-12-09 – 2018-12-12 (×5): 16 [IU] via SUBCUTANEOUS
  Filled 2018-12-08 (×5): qty 0.16

## 2018-12-08 MED ORDER — ONDANSETRON HCL 4 MG/2ML IJ SOLN: 4.0000 mg | Freq: Four times a day (QID) | INTRAMUSCULAR | Status: DC | PRN

## 2018-12-08 MED ORDER — ALBUTEROL SULFATE (2.5 MG/3ML) 0.083% IN NEBU: 2.5000 mg | INHALATION_SOLUTION | Freq: Four times a day (QID) | RESPIRATORY_TRACT | Status: DC | PRN

## 2018-12-08 MED ORDER — FUROSEMIDE 10 MG/ML IJ SOLN
60.0000 mg | Freq: Once | INTRAMUSCULAR | Status: AC
  Administered 2018-12-08: 60 mg via INTRAVENOUS
  Filled 2018-12-08: qty 6

## 2018-12-08 MED ORDER — COLCHICINE 0.6 MG PO TABS
0.6000 mg | ORAL_TABLET | Freq: Two times a day (BID) | ORAL | Status: DC
  Administered 2018-12-08 – 2018-12-12 (×8): 0.6 mg via ORAL
  Filled 2018-12-08 (×8): qty 1

## 2018-12-08 MED ORDER — APIXABAN 5 MG PO TABS
5.0000 mg | ORAL_TABLET | Freq: Two times a day (BID) | ORAL | Status: DC
  Administered 2018-12-08 – 2018-12-11 (×6): 5 mg via ORAL
  Filled 2018-12-08 (×7): qty 1

## 2018-12-08 MED ORDER — POTASSIUM CHLORIDE CRYS ER 20 MEQ PO TBCR
40.0000 meq | EXTENDED_RELEASE_TABLET | Freq: Two times a day (BID) | ORAL | Status: AC
  Administered 2018-12-08 – 2018-12-10 (×4): 40 meq via ORAL
  Filled 2018-12-08 (×4): qty 2

## 2018-12-08 MED ORDER — AZATHIOPRINE 50 MG PO TABS
50.0000 mg | ORAL_TABLET | Freq: Two times a day (BID) | ORAL | Status: DC
  Administered 2018-12-08 – 2018-12-12 (×8): 50 mg via ORAL
  Filled 2018-12-08 (×9): qty 1

## 2018-12-08 MED ORDER — AMLODIPINE BESYLATE 10 MG PO TABS: 10.0000 mg | ORAL_TABLET | Freq: Every day | ORAL | Status: DC

## 2018-12-08 MED ORDER — FUROSEMIDE 40 MG PO TABS
40.0000 mg | ORAL_TABLET | Freq: Two times a day (BID) | ORAL | Status: DC
  Administered 2018-12-09 – 2018-12-12 (×8): 40 mg via ORAL
  Filled 2018-12-08 (×8): qty 1

## 2018-12-08 MED ORDER — BENZONATATE 100 MG PO CAPS
200.0000 mg | ORAL_CAPSULE | Freq: Three times a day (TID) | ORAL | Status: DC | PRN
  Administered 2018-12-08 – 2018-12-09 (×2): 200 mg via ORAL
  Filled 2018-12-08 (×2): qty 2

## 2018-12-08 MED ORDER — PANTOPRAZOLE SODIUM 40 MG PO TBEC
40.0000 mg | DELAYED_RELEASE_TABLET | Freq: Every day | ORAL | Status: DC
  Administered 2018-12-08 – 2018-12-12 (×5): 40 mg via ORAL
  Filled 2018-12-08 (×5): qty 1

## 2018-12-08 MED ORDER — DILTIAZEM HCL-DEXTROSE 100-5 MG/100ML-% IV SOLN (PREMIX)
5.0000 mg/h | INTRAVENOUS | Status: AC
  Administered 2018-12-08: 5 mg/h via INTRAVENOUS
  Administered 2018-12-08 – 2018-12-09 (×2): 15 mg/h via INTRAVENOUS
  Filled 2018-12-08 (×3): qty 100

## 2018-12-08 MED ORDER — METOPROLOL TARTRATE 25 MG PO TABS
25.0000 mg | ORAL_TABLET | Freq: Two times a day (BID) | ORAL | Status: DC
  Administered 2018-12-08 – 2018-12-10 (×4): 25 mg via ORAL
  Filled 2018-12-08 (×4): qty 1

## 2018-12-08 MED ORDER — HYDROXYCHLOROQUINE SULFATE 200 MG PO TABS
200.0000 mg | ORAL_TABLET | Freq: Two times a day (BID) | ORAL | Status: DC
  Administered 2018-12-08 – 2018-12-12 (×8): 200 mg via ORAL
  Filled 2018-12-08 (×8): qty 1

## 2018-12-08 MED ORDER — ACETAMINOPHEN 325 MG PO TABS
650.0000 mg | ORAL_TABLET | ORAL | Status: DC | PRN
  Administered 2018-12-08: 21:00:00 650 mg via ORAL
  Filled 2018-12-08: qty 2

## 2018-12-08 MED ORDER — INSULIN ASPART 100 UNIT/ML ~~LOC~~ SOLN
6.0000 [IU] | Freq: Three times a day (TID) | SUBCUTANEOUS | Status: DC
  Administered 2018-12-09 – 2018-12-12 (×10): 6 [IU] via SUBCUTANEOUS

## 2018-12-08 MED ORDER — DILTIAZEM LOAD VIA INFUSION
10.0000 mg | Freq: Once | INTRAVENOUS | Status: AC
  Administered 2018-12-08: 10 mg via INTRAVENOUS
  Filled 2018-12-08: qty 10

## 2018-12-08 NOTE — ED Notes (Signed)
Cardiology completed eval and will admit to their service.  Pt remains pain free with intermittent cough and production of clear fluid.   No other changes noted.  IV patent and intact.

## 2018-12-08 NOTE — ED Notes (Signed)
Presents to room with continued cough and SOB.  Pt has difficulty speaking a full sentence d/t dyspnea.   Skin warm and dry.  Alert and  Calm.

## 2018-12-08 NOTE — ED Provider Notes (Signed)
[facility] EMERGENCY DEPARTMENT Provider Note   CSN: [id number] Arrival date & time: 12/08/18  1303    History   Chief Complaint Chief Complaint  Patient presents with  . Shortness of Breath    HPI [name] is a 43 y.o. female.     HPI Patient with multiple medical issues including lupus, cardiomyopathy now presents with dyspnea, generalized weakness. Patient notes that she has had symptoms for about 1 month, was diagnosed with pericardial effusion about 2 weeks ago. She improved until a week ago, coincident with last dose of prednisone. She notes that over the past week, she has now had worsening dyspnea, fatigue, particularly with exertion. No new fever, cough. Swelling is diffuse. There is more generalized discomfort than focal pain. Today, she saw her rheumatologist, and with these concerns she was sent here for evaluation. She does have a history of atrial fibrillation, notes that she has had episodes in the past, though not recently. She notes that she is currently also suffering from a palpitations, though again not pain.  Past Medical History:  Diagnosis Date  . Abnormal Pap smear of cervix    colpo, HPV  . Allergy   . Anemia   . CHF (congestive heart failure) ([place])   . Depression    after losses  . Enlarged heart    managed by cardiology  . Fibroid   . Gestational diabetes   . Heart murmur   . Hypertension   . Infection    UTI  . Lupus ([place]) 1999    Patient Active Problem List   Diagnosis Date Noted  . Moderate aortic stenosis 11/19/2018  . Postinflammatory pulmonary fibrosis ([place]) 09/25/2018  . Hypokalemia 09/17/2018  . AKI (acute kidney injury) ([place]) 08/25/2018  . Encounter for annual routine gynecological examination 07/09/2018  . Abnormal vaginal Pap smear 07/09/2018  . Depo-Provera contraceptive status 07/09/2018  . A-fib ([place]) 05/12/2018  . Atrial flutter ([place]) 05/12/2018  . Pericardial effusion 01/16/2018  .  Chronic diastolic CHF (congestive heart failure) ([place]) 01/16/2018  . SOB (shortness of breath) 12/25/2017  . Hypertension 12/25/2017  . Microcytic anemia 12/25/2017  . Type 2 diabetes mellitus with hyperglycemia, without long-term current use of insulin ([place]) 12/25/2017  . Chronic pain of both knees 04/23/2017  . Fibroids 01/20/2015  . Menorrhagia 01/20/2015  . Low grade squamous intraepithelial lesion (LGSIL) on cervical Pap smear on 01/20/15 01/20/2015  . Atypical glandular cells on cervical Pap smear on 01/20/15 01/20/2015  . Lupus ([place]) 07/15/2013    Past Surgical History:  Procedure Laterality Date  . A-FLUTTER ABLATION N/A 06/10/2018   Procedure: A-FLUTTER ABLATION;  Surgeon: Evans Lance, MD;  Location: [facility];  Service: Cardiovascular;  Laterality: N/A;  . RIGHT/LEFT HEART CATH AND CORONARY ANGIOGRAPHY N/A 03/14/2018   Procedure: RIGHT/LEFT HEART CATH AND CORONARY ANGIOGRAPHY;  Surgeon: [name], MD;  Location: [facility];  Service: Cardiovascular;  Laterality: N/A;  . THERAPEUTIC ABORTION       OB History    Gravida  5   Para  2   Term  0   Preterm  2   AB  3   Living  2     SAB  2   TAB  1   Ectopic  0   Multiple  0   Live Births  2            Home Medications    Prior to Admission medications  Medication Sig Start Date End Date Taking? Authorizing Provider  acetaminophen (TYLENOL) 500 MG tablet Take 1,000 mg by mouth every 6 (six) hours as needed for headache (pain).   Yes [provider]  albuterol (PROVENTIL HFA;VENTOLIN HFA) 108 (90 Base) MCG/ACT inhaler Inhale 2 puffs into the lungs every 6 (six) hours as needed for wheezing or shortness of breath. 11/17/18  Yes Gildardo Pounds, NP  amLODipine (NORVASC) 10 MG tablet Take 10 mg by mouth daily.   Yes [provider]  aspirin EC 81 MG tablet Take 81 mg by mouth daily.   Yes [provider]  azaTHIOprine (IMURAN) 50 MG tablet Take 50 mg by  mouth 2 (two) times daily.   Yes [provider]  benzonatate (TESSALON) 200 MG capsule Take 1 capsule (200 mg total) by mouth 3 (three) times daily as needed for cough. 08/27/18  Yes Mikhail, Velta Addison, DO  cetirizine (ZYRTEC) 10 MG tablet Take 1 tablet (10 mg total) by mouth daily. 11/17/18 02/15/19 Yes Gildardo Pounds, NP  colchicine 0.6 MG tablet Take 1 tablet (0.6 mg total) by mouth daily. Patient taking differently: Take 0.6 mg by mouth every evening.  11/20/18  Yes Patwardhan, Manish J, MD  furosemide (LASIX) 40 MG tablet TAKE 1 TABLET (40 MG TOTAL) BY MOUTH 2 (TWO) TIMES DAILY. 11/20/18  Yes Gildardo Pounds, NP  hydroxychloroquine (PLAQUENIL) 200 MG tablet Take 1 tablet (200 mg total) by mouth 2 (two) times daily. 01/18/18  Yes Rai, Ripudeep K, MD  insulin glargine (LANTUS) 100 UNIT/ML injection Inject 0.08 mLs (8 Units total) into the skin daily before breakfast. Patient taking differently: Inject 16 Units into the skin daily before breakfast.  09/16/18  Yes Elsie Stain, MD  medroxyPROGESTERone (DEPO-PROVERA) 150 MG/ML injection Inject 150 mg into the muscle every 3 (three) months.   Yes [provider]  metFORMIN (GLUCOPHAGE) 1000 MG tablet Take 1 tablet (1,000 mg total) by mouth 2 (two) times daily with a meal. 09/16/18  Yes Elsie Stain, MD  metoprolol tartrate (LOPRESSOR) 25 MG tablet Take 1 tablet (25 mg total) by mouth 2 (two) times daily. 09/16/18  Yes Elsie Stain, MD  omeprazole (PRILOSEC) 20 MG capsule Take 1 capsule (20 mg total) by mouth daily as needed (for heartburn or GERD-like symptoms). Patient taking differently: Take 20 mg by mouth daily.  09/16/18 01/14/19 Yes Elsie Stain, MD  Phenyleph-CPM-DM-Aspirin (ALKA-SELTZER PLUS COLD & COUGH PO) Take 2 tablets by mouth every 4 (four) hours as needed (cough/congestion).   Yes [provider]  potassium chloride (K-DUR) 10 MEQ tablet Take 1 tablet (10 mEq total) by mouth daily. 09/17/18  Yes Elsie Stain, MD  rosuvastatin (CRESTOR) 20 MG tablet Take 1 tablet (20 mg total) by mouth daily at 6 PM. 09/16/18  Yes Elsie Stain, MD  acetaminophen (TYLENOL) 325 MG tablet Take 2 tablets (650 mg total) by mouth every 4 (four) hours as needed for headache or mild pain. Patient not taking: Reported on 12/08/2018 12/28/17   Georgette Shell, MD  amLODipine (NORVASC) 5 MG tablet Take 1 tablet (5 mg total) by mouth daily. Patient not taking: Reported on 12/08/2018 09/16/18   Elsie Stain, MD  Blood Glucose Monitoring Suppl (TRUE METRIX METER) w/Device KIT Check blood sugars fasting and at bedtime 01/30/18   Argentina Donovan, PA-C  glucose blood (TRUE METRIX BLOOD GLUCOSE TEST) test strip Use as instructed 07/02/18   Gildardo Pounds, NP  TRUEPLUS LANCETS 28G MISC Check blood sugars as directed 01/30/18   Argentina Donovan, PA-C    Family History Family History  Problem Relation Age of Onset  . Cancer Maternal Grandfather        lung  . Diabetes Mother   . Hypertension Maternal Grandmother   . Diabetes Father   . Hearing loss Neg Hx     Social History Social History   Tobacco Use  . Smoking status: Never Smoker  . Smokeless tobacco: Never Used  Substance Use Topics  . Alcohol use: Yes    Comment: ocasionally   . Drug use: No     Allergies   Carrot oil; Celery oil; Peanut-containing drug products; and Septra [bactrim]   Review of Systems Review of Systems  Constitutional:       Per HPI, otherwise negative  HENT:       Per HPI, otherwise negative  Respiratory:       Per HPI, otherwise negative  Cardiovascular:       Per HPI, otherwise negative  Gastrointestinal: Negative for vomiting.  Endocrine:       Negative aside from HPI  Genitourinary:       Neg aside from HPI   Musculoskeletal:       Per HPI, otherwise negative  Skin: Negative.   Allergic/Immunologic: Positive for immunocompromised state.  Neurological: Positive for weakness. Negative for syncope.      Physical Exam Updated Vital Signs BP (!) 127/93   Pulse 65   Temp 99.9 F (37.7 C) (Oral)   Resp (!) 22   Ht 5' (1.524 m)   Wt 104.3 kg   SpO2 96%   BMI 44.92 kg/m   Physical Exam Vitals signs and nursing note reviewed.  Constitutional:      General: She is not in acute distress.    Appearance: She is well-developed.     Comments: Large female awake, alert, but speaking with dyspnea.  HENT:     Head: Normocephalic and atraumatic.  Eyes:     Conjunctiva/sclera: Conjunctivae normal.  Cardiovascular:     Rate and Rhythm: Tachycardia present. Rhythm irregular.  Pulmonary:     Breath sounds: Decreased breath sounds present.  Abdominal:     General: There is no distension.  Musculoskeletal:     Right lower leg: Edema present.     Left lower leg: Edema present.  Skin:    General: Skin is warm and dry.  Neurological:     Mental Status: She is alert and oriented to person, place, and time.     Cranial Nerves: No cranial nerve deficit.      ED Treatments / Results  Labs (all labs ordered are listed, but only abnormal results are displayed) Labs Reviewed  COMPREHENSIVE METABOLIC PANEL - Abnormal; Notable for the following components:      Result Value   Potassium 3.1 (*)    Glucose, Bld 156 (*)    All other components within normal limits  CBC WITH DIFFERENTIAL/PLATELET - Abnormal; Notable for the following components:   MCV 75.7 (*)    MCH 24.5 (*)    All other components within normal limits  BRAIN NATRIURETIC PEPTIDE - Abnormal; Notable for the following components:   B Natriuretic Peptide 351.5 (*)    All other components within normal limits  MAGNESIUM - Abnormal; Notable for the following components:   Magnesium 1.5 (*)    All other components within normal limits  CK  BASIC METABOLIC PANEL  MAGNESIUM    EKG EKG Interpretation  Date/Time:  Monday December 08 2018 13:37:43 EDT Ventricular Rate:  160 PR Interval:    QRS Duration: 92 QT Interval:  252  QTC Calculation: 411 R Axis:   139 Text Interpretation:  Atrial fibrillation with rapid ventricular response Right axis deviation Nonspecific T wave abnormality Abnormal ECG Abnormal ekg Confirmed by Carmin Muskrat [phone]) on 12/08/2018 3:56:27 PM   Radiology Dg Chest Port 1 View  Result Date: 12/08/2018 CLINICAL DATA:  44 year old female with shortness of breath EXAM: PORTABLE CHEST 1 VIEW COMPARISON:  CT 09/24/2018, plain film 05/23/2018 FINDINGS: Cardiomediastinal silhouette unchanged with cardiomegaly. Reticular pattern of opacity of the bilateral lungs with peribronchial thickening. Low lung volumes. No pleural effusion or pneumothorax. IMPRESSION: Nonspecific reticular pattern of opacity, may represent interstitial lung disease which was previously seen on CT versus superimposed multifocal infection. Electronically Signed   By: Corrie Mckusick D.O.   On: 12/08/2018 16:52    Procedures Procedures (including critical care time)  Medications Ordered in ED Medications  diltiazem (CARDIZEM) 1 mg/mL load via infusion 10 mg (10 mg Intravenous Bolus from Bag 12/08/18 1647)    And  diltiazem (CARDIZEM) 100 mg in dextrose 5% 173m (1 mg/mL) infusion (10 mg/hr Intravenous Rate/Dose Change 12/08/18 1759)  furosemide (LASIX) injection 60 mg (has no administration in time range)  acetaminophen (TYLENOL) tablet 650 mg (has no administration in time range)  ondansetron (ZOFRAN) injection 4 mg (has no administration in time range)  apixaban (ELIQUIS) tablet 5 mg (has no administration in time range)  magnesium sulfate IVPB 2 g 50 mL (has no administration in time range)  potassium chloride SA (K-DUR) CR tablet 40 mEq (has no administration in time range)  insulin aspart (novoLOG) injection 0-15 Units (has no administration in time range)  insulin aspart (novoLOG) injection 6 Units (has no administration in time range)  albuterol (PROVENTIL) (2.5 MG/3ML) 0.083% nebulizer solution 2.5 mg (has no  administration in time range)  amLODipine (NORVASC) tablet 10 mg (has no administration in time range)  azaTHIOprine (IMURAN) tablet 50 mg (has no administration in time range)  benzonatate (TESSALON) capsule 200 mg (has no administration in time range)  colchicine tablet 0.6 mg (has no administration in time range)  furosemide (LASIX) tablet 40 mg (has no administration in time range)  hydroxychloroquine (PLAQUENIL) tablet 200 mg (has no administration in time range)  insulin glargine (LANTUS) injection 16 Units (has no administration in time range)  metoprolol tartrate (LOPRESSOR) tablet 25 mg (has no administration in time range)  pantoprazole (PROTONIX) EC tablet 40 mg (has no administration in time range)  rosuvastatin (CRESTOR) tablet 20 mg (has no administration in time range)     Initial Impression / Assessment and Plan / ED Course  I have reviewed the triage vital signs and the nursing notes.  Pertinent labs & imaging results that were available during my care of the patient were reviewed by me and considered in my medical decision making (see chart for details).    Immediately after the initial evaluation with concern for the patient's palpitations, dyspnea, cardiomyopathy she was placed on continuous cardiac monitoring. Patient found to have atrial fibrillation with rapid ventricular response, consistent with report from outpatient facility.   With this, the patient had Cardizem bolus, drip started, and I soon thereafter as discussed her case with her cardiology team.   6:20 PM Heart rate now diminished, where is she arrived with a heart rate in the 150, 160 range, and  it is now in the 120 range.  This patient with multiple medical issues including lupus, cardiomyopathy, recently diagnosed pericardial effusion presents with dyspnea, fatigue. Patient found to have atrial fibrillation with rapid ventricular response, fluid overloaded status. In the emergency department  patient received intervention with Cardizem bolus, drip, and IV Lasix. But the patient does have noted cardiomegaly, there is low suspicion for acute tamponade, with no hypotension.  On however, with her other notable findings, after discussion with our cardiology colleagues, she required admission for ongoing monitoring, management, evaluation.    Final Clinical Impressions(s) / ED Diagnoses   Final diagnoses:  Atrial fibrillation with rapid ventricular response ([place])   CRITICAL CARE Performed by: Carmin Muskrat Total critical care time: 35 minutes Critical care time was exclusive of separately billable procedures and treating other patients. Critical care was necessary to treat or prevent imminent or life-threatening deterioration. Critical care was time spent personally by me on the following activities: development of treatment plan with patient and/or surrogate as well as nursing, discussions with consultants, evaluation of patient's response to treatment, examination of patient, obtaining history from patient or surrogate, ordering and performing treatments and interventions, ordering and review of laboratory studies, ordering and review of radiographic studies, pulse oximetry and re-evaluation of patient's condition.    Carmin Muskrat, MD 12/08/18 [phone]

## 2018-12-08 NOTE — Discharge Instructions (Signed)

## 2018-12-08 NOTE — H&P (Addendum)
[name] is an 43 y.o. female.   Chief Complaint: Shortness of breath HPI:   43 year old female with long-standing SLE, hypertension, type 2 diabetes mellitus, nonobstructive CAD, moderate aortic stenosis, recurrent pericardial effusion without tampoande physiology (cath 03/2018), recurrent atrial flutter, status post ablation 05/2018.  I had seen the patient in the office 3 weeks ago, along with echocardiogram to evaluate her pericardial effusion and aortic stenosis.  Although patient has had worsening shortness of breath, her echocardiogram shows unchanged moderate pericardial effusion without tamponade physiology.  Aortic stenosis is also stable mild to moderate severity.  Her recent CT chest had showed new findings of interstitial lung disease.  I recommended that patient establish care with pulmonology.  Unfortunately, her appointment was canceled in light of current pandemic.  Today, patient was seen her rheumatologist Dr. Marella Chimes.  She was found to be tachycardic and hypoxic in low 90s on oxygen saturation.  She was thus sent to the emergency department.  In the ED, she is found to be in A. fib RVR with ventricular rate greater than 140 bpm. She has been short of breath with dry cough. She denies fever, chills, nausea, vomiting.     Past Medical History:  Diagnosis Date  . Abnormal Pap smear of cervix    colpo, HPV  . Allergy   . Anemia   . CHF (congestive heart failure) ([facility])   . Depression    after losses  . Enlarged heart    managed by cardiology  . Fibroid   . Gestational diabetes   . Heart murmur   . Hypertension   . Infection    UTI  . Lupus ([place]) 1999    Past Surgical History:  Procedure Laterality Date  . A-FLUTTER ABLATION N/A 06/10/2018   Procedure: A-FLUTTER ABLATION;  Surgeon: Evans Lance, MD;  Location: [facility];  Service: Cardiovascular;  Laterality: N/A;  . RIGHT/LEFT HEART CATH AND CORONARY ANGIOGRAPHY N/A 03/14/2018   Procedure:  RIGHT/LEFT HEART CATH AND CORONARY ANGIOGRAPHY;  Surgeon: [name], MD;  Location: [facility];  Service: Cardiovascular;  Laterality: N/A;  . THERAPEUTIC ABORTION      Family History  Problem Relation Age of Onset  . Cancer Maternal Grandfather        lung  . Diabetes Mother   . Hypertension Maternal Grandmother   . Diabetes Father   . Hearing loss Neg Hx    Social History:  reports that she has never smoked. She has never used smokeless tobacco. She reports current alcohol use. She reports that she does not use drugs.  Allergies:  Allergies  Allergen Reactions  . Carrot Oil Shortness Of Breath, Itching, Rash and Other (See Comments)    Bumps on tongue, also  . Celery Oil Shortness Of Breath, Itching, Rash and Other (See Comments)    Bumps on tongue, also  . Peanut-Containing Drug Products Anaphylaxis, Shortness Of Breath, Itching, Rash and Other (See Comments)    Bumps on tongue, also  . Septra [Bactrim] Hives    Review of Systems  Constitution: Negative for decreased appetite, malaise/fatigue, weight gain and weight loss.  HENT: Negative for congestion.   Eyes: Negative for visual disturbance.  Cardiovascular: Negative for chest pain, dyspnea on exertion, leg swelling, palpitations and syncope.  Respiratory: Positive for cough and shortness of breath.   Endocrine: Negative for cold intolerance.  Hematologic/Lymphatic: Does not bruise/bleed easily.  Skin: Negative for itching and rash.  Musculoskeletal: Negative for myalgias.  Gastrointestinal: Negative for abdominal pain, nausea and vomiting.  Genitourinary: Negative for dysuria.  Neurological: Negative for dizziness and weakness.  Psychiatric/Behavioral: The patient is not nervous/anxious.   All other systems reviewed and are negative.    Blood pressure (!) 175/127, pulse 65, temperature 99.9 F (37.7 C), temperature source Oral, resp. rate (!) 29, height 5' (1.524 m), weight 104.3 kg, SpO2 96 %.  Body mass index is 44.92 kg/m.  Physical Exam  Constitutional: She is oriented to person, place, and time. She appears well-developed and well-nourished. No distress.  HENT:  Head: Normocephalic and atraumatic.  Eyes: Pupils are equal, round, and reactive to light. Conjunctivae are normal.  Neck: No JVD present.  Cardiovascular: Intact distal pulses. An irregularly irregular rhythm present.  Murmur heard.  Harsh midsystolic murmur is present with a grade of 3/6 at the upper right sternal border radiating to the neck. Pulmonary/Chest: Effort normal. She has no wheezes. She has rales (Bibasilar).  Abdominal: Soft. Bowel sounds are normal. There is no rebound.  Musculoskeletal:        General: Edema (Trace b/l) present.  Lymphadenopathy:    She has no cervical adenopathy.  Neurological: She is alert and oriented to person, place, and time. No cranial nerve deficit.  Skin: Skin is warm and dry.  Psychiatric: She has a normal mood and affect.  Nursing note and vitals reviewed.   Results for orders placed or performed during the hospital encounter of 12/08/18 (from the past 48 hour(s))  Comprehensive metabolic panel     Status: Abnormal   Collection Time: 12/08/18  3:26 PM  Result Value Ref Range   Sodium 140 135 - 145 mmol/L   Potassium 3.1 (L) 3.5 - 5.1 mmol/L   Chloride 101 98 - 111 mmol/L   CO2 27 22 - 32 mmol/L   Glucose, Bld 156 (H) 70 - 99 mg/dL   BUN 7 6 - 20 mg/dL   Creatinine, Ser 0.85 0.44 - 1.00 mg/dL   Calcium 9.1 8.9 - 10.3 mg/dL   Total Protein 8.1 6.5 - 8.1 g/dL   Albumin 3.5 3.5 - 5.0 g/dL   AST 20 15 - 41 U/L   ALT 11 0 - 44 U/L   Alkaline Phosphatase 73 38 - 126 U/L   Total Bilirubin 0.8 0.3 - 1.2 mg/dL   GFR calc non Af Amer >60 >60 mL/min   GFR calc Af Amer >60 >60 mL/min   Anion gap 12 5 - 15    Comment: Performed at [facility], [address] [place], [place] [address]  CBC WITH DIFFERENTIAL     Status: Abnormal   Collection Time: 12/08/18   3:26 PM  Result Value Ref Range   WBC 6.5 4.0 - 10.5 K/uL   RBC 4.93 3.87 - 5.11 MIL/uL   Hemoglobin 12.1 12.0 - 15.0 g/dL   HCT 37.3 36.0 - 46.0 %   MCV 75.7 (L) 80.0 - 100.0 fL   MCH 24.5 (L) 26.0 - 34.0 pg   MCHC 32.4 30.0 - 36.0 g/dL   RDW 14.7 11.5 - 15.5 %   Platelets 361 150 - 400 K/uL   nRBC 0.0 0.0 - 0.2 %   Neutrophils Relative % 58 %   Neutro Abs 3.8 1.7 - 7.7 K/uL   Lymphocytes Relative 27 %   Lymphs Abs 1.7 0.7 - 4.0 K/uL   Monocytes Relative 12 %   Monocytes Absolute 0.8 0.1 - 1.0 K/uL   Eosinophils Relative 2 %  Eosinophils Absolute 0.2 0.0 - 0.5 K/uL   Basophils Relative 1 %   Basophils Absolute 0.0 0.0 - 0.1 K/uL   Immature Granulocytes 0 %   Abs Immature Granulocytes 0.01 0.00 - 0.07 K/uL    Comment: Performed at [facility] [address], [place], [place] [address]  Brain natriuretic peptide     Status: Abnormal   Collection Time: 12/08/18  3:26 PM  Result Value Ref Range   B Natriuretic Peptide 351.5 (H) 0.0 - 100.0 pg/mL    Comment: Performed at [facility] [address], [place], [place] [address]  CK     Status: None   Collection Time: 12/08/18  3:47 PM  Result Value Ref Range   Total CK 143 38 - 234 U/L    Comment: Performed at [facility], [place] [address], [place], [place] [address]  Magnesium     Status: Abnormal   Collection Time: 12/08/18  3:47 PM  Result Value Ref Range   Magnesium 1.5 (L) 1.7 - 2.4 mg/dL    Comment: Performed at [facility] [address], [place],  [address]    Labs:   Lab Results  Component Value Date   WBC 6.5 12/08/2018   HGB 12.1 12/08/2018   HCT 37.3 12/08/2018   MCV 75.7 (L) 12/08/2018   PLT 361 12/08/2018    Recent Labs  Lab 12/08/18 1526  NA 140  K 3.1*  CL 101  CO2 27  BUN 7  CREATININE 0.85  CALCIUM 9.1  PROT 8.1  BILITOT 0.8  ALKPHOS 73  ALT 11  AST 20  GLUCOSE 156*    Lipid Panel     Component Value Date/Time   CHOL  08/18/2008 1746    179         ATP III CLASSIFICATION:  <200     mg/dL   Desirable  200-239  mg/dL   Borderline High  >=240    mg/dL   High          TRIG 100 08/18/2008 1746   HDL 37 (L) 08/18/2008 1746   CHOLHDL 4.8 08/18/2008 1746   VLDL 20 08/18/2008 1746   LDLCALC (H) 08/18/2008 1746    122        Total Cholesterol/HDL:CHD Risk Coronary Heart Disease Risk Table                     Men   Women  1/2 Average Risk   3.4   3.3  Average Risk       5.0   4.4  2 X Average Risk   9.6   7.1  3 X Average Risk  23.4   11.0        Use the calculated Patient Ratio above and the CHD Risk Table to determine the patient's CHD Risk.        ATP III CLASSIFICATION (LDL):  <100     mg/dL   Optimal  100-129  mg/dL   Near or Above                    Optimal  130-159  mg/dL   Borderline  160-189  mg/dL   High  >190     mg/dL   Very High    BNP (last 3 results) Recent Labs    03/13/18 0218 05/12/18 1002 12/08/18 1526  BNP 229.2* 302.7* 351.5*    HEMOGLOBIN A1C Lab Results  Component Value Date   HGBA1C 6.2 (A) 09/16/2018   MPG 214.47 03/13/2018    Cardiac Panel (last 3 results) Recent Labs    03/13/18 0952 03/13/18 1543 03/13/18 2045 12/08/18 1547  CKTOTAL  --   --   --  143  TROPONINI 0.03* 0.03* 0.03*  --     Lab Results  Component Value Date   CKTOTAL 143 12/08/2018   CKMB 0.9 09/09/2008   TROPONINI 0.03 (HH) 03/13/2018     TSH Recent Labs    12/25/17 0206 03/13/18 0218 05/12/18 2022  TSH 5.026* 1.041 0.817     (Not in a hospital admission)     Current Facility-Administered Medications:  .  acetaminophen (TYLENOL) tablet 1,000 mg, 1,000 mg, Oral, Q6H PRN, Fionn Stracke J, MD .  acetaminophen (TYLENOL) tablet 650 mg, 650 mg, Oral, Q4H PRN, Jahking Lesser J, MD .  albuterol (VENTOLIN HFA) 108 (90 Base) MCG/ACT inhaler 2 puff, 2 puff, Inhalation, Q6H PRN, Vaidehi Braddy J, MD .  amLODipine (NORVASC) tablet 10 mg, 10 mg, Oral, Daily, Shakir Petrosino J, MD .   apixaban (ELIQUIS) tablet 5 mg, 5 mg, Oral, BID, Chasey Dull J, MD .  azaTHIOprine (IMURAN) tablet 50 mg, 50 mg, Oral, BID, Damaris Abeln J, MD .  benzonatate (TESSALON) capsule 200 mg, 200 mg, Oral, TID PRN, Isabellamarie Randa J, MD .  colchicine tablet 0.6 mg, 0.6 mg, Oral, BID, Keyshaun Exley J, MD .  [COMPLETED] diltiazem (CARDIZEM) 1 mg/mL load via infusion 10 mg, 10 mg, Intravenous, Once, 10 mg at 12/08/18 1647 **AND** diltiazem (CARDIZEM) 100 mg in dextrose 5% 185m (1 mg/mL) infusion, 5-15 mg/hr, Intravenous, Continuous, LCarmin Muskrat MD, Last Rate: 5 mL/hr at 12/08/18 1648, 5 mg/hr at 12/08/18 1648 .  furosemide (LASIX) injection 60 mg, 60 mg, Intravenous, Once, LCarmin Muskrat MD .  furosemide (LASIX) tablet 40 mg, 40 mg, Oral, BID, Vivek Grealish J, MD .  hydroxychloroquine (PLAQUENIL) tablet 200 mg, 200 mg, Oral, BID, Kaiser Belluomini J, MD .  [Derrill MemoON 12/09/2018] insulin aspart (novoLOG) injection 0-15 Units, 0-15 Units, Subcutaneous, TID WC, Derold Dorsch J, MD .  [Derrill MemoON 12/09/2018] insulin aspart (novoLOG) injection 6 Units, 6 Units, Subcutaneous, TID WC, Kyeshia Zinn J, MD .  [Derrill MemoON 12/09/2018] insulin glargine (LANTUS) injection 16 Units, 16 Units, Subcutaneous, QAC breakfast, Orhan Mayorga J, MD .  magnesium sulfate IVPB 2 g 50 mL, 2 g, Intravenous, Once, Kinya Meine J, MD .  metoprolol tartrate (LOPRESSOR) tablet 25 mg, 25 mg, Oral, BID, Windy Dudek J, MD .  ondansetron (ZOFRAN) injection 4 mg, 4 mg, Intravenous, Q6H PRN, Averi Cacioppo J, MD .  pantoprazole (PROTONIX) EC tablet 40 mg, 40 mg, Oral, Daily, Renton Berkley J, MD .  potassium chloride SA (K-DUR) CR tablet 40 mEq, 40 mEq, Oral, BID, Elmer Boutelle J, MD .  rosuvastatin (CRESTOR) tablet 20 mg, 20 mg, Oral, q1800, Kinya Meine J, MD  Current Outpatient Medications:  .  acetaminophen (TYLENOL) 500 MG tablet, Take 1,000 mg by mouth every 6  (six) hours as needed for headache (pain)., Disp: , Rfl:  .  albuterol (PROVENTIL HFA;VENTOLIN HFA) 108 (90 Base) MCG/ACT inhaler, Inhale 2 puffs into the lungs every 6 (six) hours as needed for wheezing or shortness of breath., Disp: 1 Inhaler, Rfl: 2 .  amLODipine (NORVASC) 10 MG tablet, Take 10 mg by mouth daily., Disp: , Rfl:  .  aspirin EC 81 MG tablet, Take 81 mg by mouth daily., Disp: , Rfl:  .  azaTHIOprine (IMURAN) 50 MG tablet, Take 50 mg by mouth 2 (two) times daily., Disp: , Rfl:  .  benzonatate (TESSALON) 200 MG capsule, Take 1 capsule (200 mg total) by mouth 3 (three) times daily as needed for cough., Disp: 30 capsule, Rfl: 0 .  cetirizine (ZYRTEC) 10 MG tablet, Take 1 tablet (10 mg total) by mouth daily., Disp: 90 tablet, Rfl: 1 .  colchicine 0.6 MG tablet, Take 1 tablet (0.6 mg total) by mouth daily. (Patient taking differently: Take 0.6 mg by mouth every evening. ), Disp: 60 tablet, Rfl: 2 .  furosemide (LASIX) 40 MG tablet, TAKE 1 TABLET (40 MG TOTAL) BY MOUTH 2 (TWO) TIMES DAILY., Disp: 60 tablet, Rfl: 2 .  hydroxychloroquine (PLAQUENIL) 200 MG tablet, Take 1 tablet (200 mg total) by mouth 2 (two) times daily., Disp: 60 tablet, Rfl: 4 .  insulin glargine (LANTUS) 100 UNIT/ML injection, Inject 0.08 mLs (8 Units total) into the skin daily before breakfast. (Patient taking differently: Inject 16 Units into the skin daily before breakfast. ), Disp: 10 mL, Rfl: 3 .  medroxyPROGESTERone (DEPO-PROVERA) 150 MG/ML injection, Inject 150 mg into the muscle every 3 (three) months., Disp: , Rfl:  .  metFORMIN (GLUCOPHAGE) 1000 MG tablet, Take 1 tablet (1,000 mg total) by mouth 2 (two) times daily with a meal., Disp: 60 tablet, Rfl: 3 .  metoprolol tartrate (LOPRESSOR) 25 MG tablet, Take 1 tablet (25 mg total) by mouth 2 (two) times daily., Disp: 60 tablet, Rfl: 4 .  omeprazole (PRILOSEC) 20 MG capsule, Take 1 capsule (20 mg total) by mouth daily as needed (for heartburn or GERD-like symptoms).  (Patient taking differently: Take 20 mg by mouth daily. ), Disp: 30 capsule, Rfl: 3 .  Phenyleph-CPM-DM-Aspirin (ALKA-SELTZER PLUS COLD & COUGH PO), Take 2 tablets by mouth every 4 (four) hours as needed (cough/congestion)., Disp: , Rfl:  .  potassium chloride (K-DUR) 10 MEQ tablet, Take 1 tablet (10 mEq total) by mouth daily., Disp: 30 tablet, Rfl: 1 .  rosuvastatin (CRESTOR) 20 MG tablet, Take 1 tablet (20 mg total) by mouth daily at 6 PM., Disp: 30 tablet, Rfl: 4 .  acetaminophen (TYLENOL) 325 MG tablet, Take 2 tablets (650 mg total) by mouth every 4 (four) hours as needed for headache or mild pain. (Patient not taking: Reported on 12/08/2018), Disp: , Rfl:  .  amLODipine (NORVASC) 5 MG tablet, Take 1 tablet (5 mg total) by mouth daily. (Patient not taking: Reported on 12/08/2018), Disp: 30 tablet, Rfl: 4 .  Blood Glucose Monitoring Suppl (TRUE METRIX METER) w/Device KIT, Check blood sugars fasting and at bedtime, Disp: 1 kit, Rfl: 0 .  glucose blood (TRUE METRIX BLOOD GLUCOSE TEST) test strip, Use as instructed, Disp: 100 each, Rfl: 12 .  TRUEPLUS LANCETS 28G MISC, Check blood sugars as directed, Disp: 100 each, Rfl: 1   Today's Vitals   12/08/18 1541 12/08/18 1600 12/08/18 1630 12/08/18 1700  BP: (!) 138/101 (!) 141/112 (!) 130/110 (!) 175/127  Pulse: (!) 142 65    Resp: (!) 30 (!) 30 20 (!) 29  Temp: 99.9 F (37.7 C)     TempSrc: Oral     SpO2: 94% 96%    Weight: 104.3 kg     Height: 5' (1.524 m)     PainSc: 0-No pain      Body mass index is 44.92 kg/m.    CARDIAC STUDIES:  EKG 12/08/2018: Afib w/RVR  Echocardiogram 11/20/2018: Left ventricle cavity is normal in size. Moderate  concentric hypertrophy of the left ventricle. Normal global wall motion. Doppler evidence of grade II (pseudonormal) diastolic dysfunction, elevated LAP. Calculated EF 63%. Left atrial cavity is severely dilated. Mild aortic valve leaflet thickening. Moderate aortic valve stenosis. Mild (Grade I) aortic  regurgitation. Aortic valve mean gradient of 17 mmHg, Vmax of 2.8 m/s. Calculated aortic valve area by continuity equation is 1.2 cm. Moderate (Grade II) mitral regurgitation. Mild tricuspid regurgitation. Estimated pulmonary artery systolic pressure 25 mmHg. Moderate pericardial effusion with only small posterior loculation that measures 3.2 cm-large. No hemodynamic compromise. No significant change compared to previous study on 08/12/2018.   CT Chest 09/24/2018: 1. 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 No frank honeycombing. Differential includes usual interstitial pneumonia (UIP) or fibrotic phase nonspecific interstitial pneumonia (NSIP). Findings are categorized as probable UIP per consensus guidelines: Diagnosis of Idiopathic Pulmonary Fibrosis: An Official ATS/ERS/JRS/ALAT Clinical Practice Guideline. [place], Iss 5, [phone], Apr 13 2017. 2. Moderate pericardial effusion. 3. Three-vessel coronary atherosclerosis. 4. Chronic stable bilateral axillary and mediastinal lymphadenopathy, most compatible with benign etiology.   Echocardiogram 08/12/2018: 1. Left ventricle cavity is normal in size. Moderate concentric hypertrophy of the left ventricle. Normal global wall motion. Doppler evidence of grade II (pseudonormal) diastolic dysfunction. Calculated EF 56%. 2. Left atrial cavity is severely dilated. 3. Mild (Grade I) aortic regurgitation. Mild aortic valve leaflet calcification. Moderately restricted aortic valve leaflets. Mild to Moderate aortic valve stenosis with peak gradient 45.6, mean gradient 23.7 mm of Hg . Calculated valve area is 1.26 cm2. 4. Trace mitral regurgitation. Mild calcification of the mitral valve annulus. 5. Trace tricuspid regurgitation. 6. Moderate pericardial effusion (more marked in post. and lateral part) with clear  fluids. No signs of tamponade. 7. c.f. echo. of 01/02/2018, aortic stenosis appears moderate (it was mild before), mild AR is new, no other diagnostic change.   R + LHC 03/14/2018: Prox LAD 50% stenosis Bordelrine pulmonary hypertension No tampoande phsyiology   Cardiac MRI 03/13/2018: 1. Normal LV size and function EF 65% moderate LVH 2. Moderate to Large circumferential pericardial effusion biggest in the posterior and lateral dimensions with no diastolic RV collapse 3.  Normal appearing pericardium 4. Mild subepicardial gadolinium uptake in the basal and mid inferior wall and basal antero septum consistent with mild myocarditis 5. Tri leaflet AV with some restricted motion suggest echo correlation for degree of AS 6.  Severe LAE  Assessment & Recommendations:  43 year old female with long-standing SLE, hypertension, type 2 diabetes mellitus, nonobstructive CAD, moderate aortic stenosis, recurrent pericardial effusion without tampoande physiology (cath 03/2018), recurrent atrial flutter, now status post ablation 05/2018, now admitted with tachycardia and worsening shortness of breath   Tachycardia: Afib w/RVR. This is recurrence since ablation of flutter in 05/2018 Start IV diltiazem. Continue metoprolol tartrate 25 mg bid.  CHA2DS2VASc score 3. Annual stroke risk 3% Resume eliquis 5 mg bid. Given Afib, she will now need to indefinitely on anticoagulation She has been on sotalol in the past. She had inadvertently come off it and did well. I would avoid antiarrhythmic therapy/cardivoersion given inadequate anticoagulation, at this time time.   Shortness of breath Acute on chronic diastolic heart failure Lasix 60 mg IV now. PO 40 mg bid after that K, Mg being replaced She also needs outpatient workup for possible interstitial lung disease  Pericardial effusion: Related to lupus. Clinically not in tamponade Resume colchicine 0.6 mg bid  CAD: Continue Aspirin,  statin  Moderate AS: Stable  SLE: Continue baseline plaquenil., Imuran.  DM: Continue baseline insulin. SSI    [name], MD 12/08/2018, 5:27 PM [facility] Pager: [phone] Office: [phone] If no answer: [phone]

## 2018-12-08 NOTE — ED Notes (Signed)
Pharmacy called to ensure delivery of med.

## 2018-12-08 NOTE — ED Notes (Signed)
ED TO INPATIENT HANDOFF REPORT  ED Nurse Name and Phone #: Celene Squibb RN  S Name/Age/Gender [name] 43 y.o. female Room/Bed: 020C/020C  Code Status   Code Status: Full Code  Home/SNF/Other Home Patient oriented to: self, place, time and situation Is this baseline? Yes   Triage Complete: Triage complete  Chief Complaint SOB; Lupus Patient  Triage Note Pt sent by her Rheumatology MD Marella Chimes for admission. Due H/O ;SLE, Pericarditis ,stable  pericardial effusion . With flutter.    Allergies Allergies  Allergen Reactions  . Carrot Oil Shortness Of Breath, Itching, Rash and Other (See Comments)    Bumps on tongue, also  . Celery Oil Shortness Of Breath, Itching, Rash and Other (See Comments)    Bumps on tongue, also  . Peanut-Containing Drug Products Anaphylaxis, Shortness Of Breath, Itching, Rash and Other (See Comments)    Bumps on tongue, also  . Septra [Bactrim] Hives    Level of Care/Admitting Diagnosis ED Disposition    ED Disposition Condition [facility]: [place] [[id number]]  Level of Care: Progressive [102]  Covid Evaluation: N/A  Diagnosis: A-fib [facility]) [[id number]]  Admitting Physician: [name] [[id number]]  Attending Physician: [name] [[id number]]  Estimated length of stay: past midnight tomorrow  Certification:: I certify this patient will need inpatient services for at least 2 midnights  PT Class (Do Not Modify): Inpatient [101]  PT Acc Code (Do Not Modify): Private [1]       B Medical/Surgery History Past Medical History:  Diagnosis Date  . Abnormal Pap smear of cervix    colpo, HPV  . Allergy   . Anemia   . CHF (congestive heart failure) ([place])   . Depression    after losses  . Enlarged heart    managed by cardiology  . Fibroid   . Gestational diabetes   . Heart murmur   . Hypertension   . Infection    UTI  . Lupus ([place]) 1999   Past Surgical History:  Procedure  Laterality Date  . A-FLUTTER ABLATION N/A 06/10/2018   Procedure: A-FLUTTER ABLATION;  Surgeon: Evans Lance, MD;  Location: [place] [facility];  Service: Cardiovascular;  Laterality: N/A;  . RIGHT/LEFT HEART CATH AND CORONARY ANGIOGRAPHY N/A 03/14/2018   Procedure: RIGHT/LEFT HEART CATH AND CORONARY ANGIOGRAPHY;  Surgeon: [name], MD;  Location: [place] [facility];  Service: Cardiovascular;  Laterality: N/A;  . THERAPEUTIC ABORTION       A IV Location/Drains/Wounds Patient Lines/Drains/Airways Status   Active Line/Drains/Airways    Name:   Placement date:   Placement time:   Site:   Days:   Peripheral IV 12/08/18 Right Antecubital   12/08/18    1600    Antecubital   less than 1          Intake/Output Last 24 hours No intake or output data in the 24 hours ending 12/08/18 1803  Labs/Imaging Results for orders placed or performed during the hospital encounter of 12/08/18 (from the past 48 hour(s))  Comprehensive metabolic panel     Status: Abnormal   Collection Time: 12/08/18  3:26 PM  Result Value Ref Range   Sodium 140 135 - 145 mmol/L   Potassium 3.1 (L) 3.5 - 5.1 mmol/L   Chloride 101 98 - 111 mmol/L   CO2 27 22 - 32 mmol/L   Glucose, Bld 156 (H) 70 - 99 mg/dL   BUN 7 6 -  20 mg/dL   Creatinine, Ser 0.85 0.44 - 1.00 mg/dL   Calcium 9.1 8.9 - 10.3 mg/dL   Total Protein 8.1 6.5 - 8.1 g/dL   Albumin 3.5 3.5 - 5.0 g/dL   AST 20 15 - 41 U/L   ALT 11 0 - 44 U/L   Alkaline Phosphatase 73 38 - 126 U/L   Total Bilirubin 0.8 0.3 - 1.2 mg/dL   GFR calc non Af Amer >60 >60 mL/min   GFR calc Af Amer >60 >60 mL/min   Anion gap 12 5 - 15    Comment: Performed at [facility], [place] [address], [place], [place] [address]  CBC WITH DIFFERENTIAL     Status: Abnormal   Collection Time: 12/08/18  3:26 PM  Result Value Ref Range   WBC 6.5 4.0 - 10.5 K/uL   RBC 4.93 3.87 - 5.11 MIL/uL   Hemoglobin 12.1 12.0 - 15.0 g/dL   HCT 37.3 36.0 - 46.0 %   MCV 75.7 (L) 80.0 -  100.0 fL   MCH 24.5 (L) 26.0 - 34.0 pg   MCHC 32.4 30.0 - 36.0 g/dL   RDW 14.7 11.5 - 15.5 %   Platelets 361 150 - 400 K/uL   nRBC 0.0 0.0 - 0.2 %   Neutrophils Relative % 58 %   Neutro Abs 3.8 1.7 - 7.7 K/uL   Lymphocytes Relative 27 %   Lymphs Abs 1.7 0.7 - 4.0 K/uL   Monocytes Relative 12 %   Monocytes Absolute 0.8 0.1 - 1.0 K/uL   Eosinophils Relative 2 %   Eosinophils Absolute 0.2 0.0 - 0.5 K/uL   Basophils Relative 1 %   Basophils Absolute 0.0 0.0 - 0.1 K/uL   Immature Granulocytes 0 %   Abs Immature Granulocytes 0.01 0.00 - 0.07 K/uL    Comment: Performed at [facility], [place] [address] [place], [place] [address]  Brain natriuretic peptide     Status: Abnormal   Collection Time: 12/08/18  3:26 PM  Result Value Ref Range   B Natriuretic Peptide 351.5 (H) 0.0 - 100.0 pg/mL    Comment: Performed at [facility] [address] [place], [place] [address]  CK     Status: None   Collection Time: 12/08/18  3:47 PM  Result Value Ref Range   Total CK 143 38 - 234 U/L    Comment: Performed at [facility], [place] [address] [place], [place] [address]  Magnesium     Status: Abnormal   Collection Time: 12/08/18  3:47 PM  Result Value Ref Range   Magnesium 1.5 (L) 1.7 - 2.4 mg/dL    Comment: Performed at [facility] [address], [place],  [address]   Dg Chest Port 1 View  Result Date: 12/08/2018 CLINICAL DATA:  43 year old female with shortness of breath EXAM: PORTABLE CHEST 1 VIEW COMPARISON:  CT 09/24/2018, plain film 05/23/2018 FINDINGS: Cardiomediastinal silhouette unchanged with cardiomegaly. Reticular pattern of opacity of the bilateral lungs with peribronchial thickening. Low lung volumes. No pleural effusion or pneumothorax. IMPRESSION: Nonspecific reticular pattern of opacity, may represent interstitial lung disease which was previously seen on CT versus superimposed multifocal infection. Electronically Signed   By: Corrie Mckusick D.O.   On:  12/08/2018 16:52    Pending Labs Unresulted Labs (From admission, onward)    Start     Ordered   12/09/18 7628  Basic metabolic panel  Daily,   R  12/08/18 1715   12/09/18 0500  Magnesium  Tomorrow morning,   R     12/08/18 1715          Vitals/Pain Today's Vitals   12/08/18 1730 12/08/18 1759 12/08/18 1759 12/08/18 1800  BP: 131/71   (!) 127/93  Pulse:      Resp:  (!) 22    Temp:      TempSrc:      SpO2:      Weight:      Height:      PainSc:   4      Isolation Precautions No active isolations  Medications Medications  diltiazem (CARDIZEM) 1 mg/mL load via infusion 10 mg (10 mg Intravenous Bolus from Bag 12/08/18 1647)    And  diltiazem (CARDIZEM) 100 mg in dextrose 5% 160mL (1 mg/mL) infusion (10 mg/hr Intravenous Rate/Dose Change 12/08/18 1759)  furosemide (LASIX) injection 60 mg (has no administration in time range)  acetaminophen (TYLENOL) tablet 650 mg (has no administration in time range)  ondansetron (ZOFRAN) injection 4 mg (has no administration in time range)  apixaban (ELIQUIS) tablet 5 mg (has no administration in time range)  magnesium sulfate IVPB 2 g 50 mL (has no administration in time range)  potassium chloride SA (K-DUR) CR tablet 40 mEq (has no administration in time range)  insulin aspart (novoLOG) injection 0-15 Units (has no administration in time range)  insulin aspart (novoLOG) injection 6 Units (has no administration in time range)  albuterol (PROVENTIL) (2.5 MG/3ML) 0.083% nebulizer solution 2.5 mg (has no administration in time range)  amLODipine (NORVASC) tablet 10 mg (has no administration in time range)  azaTHIOprine (IMURAN) tablet 50 mg (has no administration in time range)  benzonatate (TESSALON) capsule 200 mg (has no administration in time range)  colchicine tablet 0.6 mg (has no administration in time range)  furosemide (LASIX) tablet 40 mg (has no administration in time range)  hydroxychloroquine (PLAQUENIL) tablet 200 mg (has  no administration in time range)  insulin glargine (LANTUS) injection 16 Units (has no administration in time range)  metoprolol tartrate (LOPRESSOR) tablet 25 mg (has no administration in time range)  pantoprazole (PROTONIX) EC tablet 40 mg (has no administration in time range)  rosuvastatin (CRESTOR) tablet 20 mg (has no administration in time range)    Mobility walks Low fall risk   Focused Assessments Cardiac Assessment Handoff:  Cardiac Rhythm: Atrial flutter, Atrial fibrillation Lab Results  Component Value Date   CKTOTAL 143 12/08/2018   CKMB 0.9 09/09/2008   TROPONINI 0.03 (HH) 03/13/2018   Lab Results  Component Value Date   DDIMER <0.27 03/13/2018   Does the Patient currently have chest pain? No     R Recommendations: See Admitting Provider Note  Report given to:   Additional Notes:

## 2018-12-08 NOTE — ED Triage Notes (Signed)
Pt sent by her Rheumatology MD Marella Chimes for admission. Due H/O ;SLE, Pericarditis ,stable  pericardial effusion . With flutter.

## 2018-12-08 NOTE — ED Notes (Signed)
Attempted report 

## 2018-12-09 ENCOUNTER — Inpatient Hospital Stay (HOSPITAL_COMMUNITY): Payer: 59

## 2018-12-09 ENCOUNTER — Encounter (HOSPITAL_COMMUNITY): Payer: Self-pay | Admitting: General Practice

## 2018-12-09 DIAGNOSIS — I480 Paroxysmal atrial fibrillation: Principal | ICD-10-CM

## 2018-12-09 DIAGNOSIS — M329 Systemic lupus erythematosus, unspecified: Secondary | ICD-10-CM

## 2018-12-09 DIAGNOSIS — J8410 Pulmonary fibrosis, unspecified: Secondary | ICD-10-CM

## 2018-12-09 LAB — GLUCOSE, CAPILLARY
Glucose-Capillary: 133 mg/dL — ABNORMAL HIGH (ref 70–99)
Glucose-Capillary: 135 mg/dL — ABNORMAL HIGH (ref 70–99)
Glucose-Capillary: 98 mg/dL (ref 70–99)
Glucose-Capillary: 138 mg/dL — ABNORMAL HIGH (ref 70–99)

## 2018-12-09 LAB — BASIC METABOLIC PANEL
Anion gap: 12 (ref 5–15)
BUN: 6 mg/dL (ref 6–20)
CO2: 24 mmol/L (ref 22–32)
Calcium: 8.7 mg/dL — ABNORMAL LOW (ref 8.9–10.3)
Chloride: 102 mmol/L (ref 98–111)
Creatinine, Ser: 0.74 mg/dL (ref 0.44–1.00)
GFR calc Af Amer: >60 mL/min (ref >60)
GFR calc non Af Amer: >60 mL/min (ref >60)
Glucose, Bld: 124 mg/dL — ABNORMAL HIGH (ref 70–99)
Potassium: 3 mmol/L — ABNORMAL LOW (ref 3.5–5.1)
Sodium: 138 mmol/L (ref 135–145)

## 2018-12-09 LAB — MAGNESIUM: Magnesium: 1.8 mg/dL (ref 1.7–2.4)

## 2018-12-09 MED ORDER — MENTHOL 3 MG MT LOZG
1.0000 | LOZENGE | OROMUCOSAL | Status: DC | PRN
  Filled 2018-12-09: qty 9

## 2018-12-09 MED ORDER — GUAIFENESIN-DM 100-10 MG/5ML PO SYRP
15.0000 mL | ORAL_SOLUTION | ORAL | Status: DC | PRN
  Administered 2018-12-09: 21:00:00 15 mL via ORAL
  Filled 2018-12-09: qty 15

## 2018-12-09 MED ORDER — DILTIAZEM HCL ER COATED BEADS 240 MG PO CP24: 240.0000 mg | ORAL_CAPSULE | Freq: Every day | ORAL | 2 refills | Status: DC

## 2018-12-09 MED ORDER — POTASSIUM CHLORIDE CRYS ER 20 MEQ PO TBCR: 40.0000 meq | EXTENDED_RELEASE_TABLET | Freq: Two times a day (BID) | ORAL | 2 refills | Status: DC

## 2018-12-09 MED ORDER — APIXABAN 5 MG PO TABS: 5.0000 mg | ORAL_TABLET | Freq: Two times a day (BID) | ORAL | 2 refills | Status: DC

## 2018-12-09 MED ORDER — COLCHICINE 0.6 MG PO TABS: 0.6000 mg | ORAL_TABLET | Freq: Two times a day (BID) | ORAL | 2 refills | Status: DC

## 2018-12-09 MED ORDER — DILTIAZEM HCL ER COATED BEADS 240 MG PO CP24
240.0000 mg | ORAL_CAPSULE | Freq: Every day | ORAL | Status: DC
  Administered 2018-12-09 – 2018-12-12 (×4): 240 mg via ORAL
  Filled 2018-12-09 (×4): qty 1

## 2018-12-09 NOTE — Plan of Care (Signed)

## 2018-12-09 NOTE — Progress Notes (Signed)
Subjective:  Feeling much better  Objective:  Vital Signs in the last 24 hours: Temp:  [98.4 F (36.9 C)-99.9 F (37.7 C)] 98.7 F (37.1 C) (04/28 0549) Pulse Rate:  [65-142] 80 (04/28 0904) Resp:  [17-30] 17 (04/28 0549) BP: (110-175)/(71-127) 135/81 (04/28 0904) SpO2:  [92 %-96 %] 94 % (04/28 0904) Weight:  [103.3 kg-104.3 kg] 103.3 kg (04/28 0549)  Intake/Output from previous day: 04/27 0701 - 04/28 0700 In: 480 [P.O.:480] Out: 650 [Urine:650]  Physical Exam  Constitutional: She is oriented to person, place, and time. She appears well-developed and well-nourished. No distress.  HENT:  Head: Normocephalic and atraumatic.  Eyes: Pupils are equal, round, and reactive to light. Conjunctivae are normal.  Neck: No JVD present.  Cardiovascular: Intact distal pulses. An irregularly irregular rhythm present. Tachycardia present.  Pulmonary/Chest: Effort normal and breath sounds normal. She has no wheezes. She has no rales.  Abdominal: Soft. Bowel sounds are normal. There is no rebound.  Musculoskeletal:        General: No edema.  Lymphadenopathy:    She has no cervical adenopathy.  Neurological: She is alert and oriented to person, place, and time. No cranial nerve deficit.  Skin: Skin is warm and dry.  Psychiatric: She has a normal mood and affect.  Nursing note and vitals reviewed.    Lab Results: BMP Recent Labs    09/16/18 1658 12/08/18 1526 12/09/18 0347  NA 138 140 138  K 3.4* 3.1* 3.0*  CL 97 101 102  CO2 25 27 24   GLUCOSE 79 156* 124*  BUN 21 7 6   CREATININE 1.07* 0.85 0.74  CALCIUM 9.2 9.1 8.7*  GFRNONAA 64 >60 >60  GFRAA 73 >60 >60    CBC Recent Labs  Lab 12/08/18 1526  WBC 6.5  RBC 4.93  HGB 12.1  HCT 37.3  PLT 361  MCV 75.7*  MCH 24.5*  MCHC 32.4  RDW 14.7  LYMPHSABS 1.7  MONOABS 0.8  EOSABS 0.2  BASOSABS 0.0    HEMOGLOBIN A1C Lab Results  Component Value Date   HGBA1C 6.2 (A) 09/16/2018   MPG 214.47 03/13/2018    Cardiac  Panel (last 3 results) Recent Labs    03/13/18 0952 03/13/18 1543 03/13/18 2045 12/08/18 1547  CKTOTAL  --   --   --  143  TROPONINI 0.03* 0.03* 0.03*  --     BNP (last 3 results) Recent Labs    03/13/18 0218 05/12/18 1002 12/08/18 1526  BNP 229.2* 302.7* 351.5*    TSH Recent Labs    12/25/17 0206 03/13/18 0218 05/12/18 2022  TSH 5.026* 1.041 0.817    Lipid Panel     Component Value Date/Time   CHOL  08/18/2008 1746    179        ATP III CLASSIFICATION:  <200     mg/dL   Desirable  200-239  mg/dL   Borderline High  >=240    mg/dL   High          TRIG 100 08/18/2008 1746   HDL 37 (L) 08/18/2008 1746   CHOLHDL 4.8 08/18/2008 1746   VLDL 20 08/18/2008 1746   LDLCALC (H) 08/18/2008 1746    122        Total Cholesterol/HDL:CHD Risk Coronary Heart Disease Risk Table                     Men   Women  1/2 Average Risk   3.4   3.3  Average Risk       5.0   4.4  2 X Average Risk   9.6   7.1  3 X Average Risk  23.4   11.0        Use the calculated Patient Ratio above and the CHD Risk Table to determine the patient's CHD Risk.        ATP III CLASSIFICATION (LDL):  <100     mg/dL   Optimal  100-129  mg/dL   Near or Above                    Optimal  130-159  mg/dL   Borderline  160-189  mg/dL   High  >190     mg/dL   Very High     Hepatic Function Panel Recent Labs    03/13/18 0312 08/25/18 1611 12/08/18 1526  PROT 8.2* 8.5* 8.1  ALBUMIN 3.5 3.9 3.5  AST 18 19 20   ALT 13 10 11   ALKPHOS 59 45 73  BILITOT 0.6 0.6 0.8   CARDIAC STUDIES:  EKG 12/08/2018: Afib w/RVR  Echocardiogram 11/20/2018: Left ventricle cavity is normal in size. Moderate concentric hypertrophy of the left ventricle. Normal global wall motion. Doppler evidence of grade II (pseudonormal) diastolic dysfunction, elevated LAP. Calculated EF 63%. Left atrial cavity is severely dilated. Mild aortic valve leaflet thickening. Moderate aortic valve stenosis. Mild (Grade I) aortic  regurgitation. Aortic valve mean gradient of 17 mmHg, Vmax of 2.8 m/s. Calculated aortic valve area by continuity equation is 1.2 cm. Moderate (Grade II) mitral regurgitation. Mild tricuspid regurgitation. Estimated pulmonary artery systolic pressure 25 mmHg. Moderate pericardial effusion with only small posterior loculation that measures 3.2 cm-large. No hemodynamic compromise. No significant change compared to previous study on 08/12/2018.  CT Chest 09/24/2018: 1. 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 No frank honeycombing. Differential includes usual interstitial pneumonia (UIP) or fibrotic phase nonspecific interstitial pneumonia (NSIP). Findings are categorized as probable UIP per consensus guidelines: Diagnosis of Idiopathic Pulmonary Fibrosis: An Official ATS/ERS/JRS/ALAT Clinical Practice Guideline. [place], Iss 5, [phone], Apr 13 2017. 2. Moderate pericardial effusion. 3. Three-vessel coronary atherosclerosis. 4. Chronic stable bilateral axillary and mediastinal lymphadenopathy, most compatible with benign etiology.  Echocardiogram 08/12/2018: 1. Left ventricle cavity is normal in size. Moderate concentric hypertrophy of the left ventricle. Normal global wall motion. Doppler evidence of grade II (pseudonormal) diastolic dysfunction. Calculated EF 56%. 2. Left atrial cavity is severely dilated. 3. Mild (Grade I) aortic regurgitation. Mild aortic valve leaflet calcification. Moderately restricted aortic valve leaflets. Mild to Moderate aortic valve stenosis with peak gradient 45.6, mean gradient 23.7 mm of Hg . Calculated valve area is 1.26 cm2. 4. Trace mitral regurgitation. Mild calcification of the mitral valve annulus. 5. Trace tricuspid regurgitation. 6. Moderate pericardial effusion (more marked in post. and lateral part) with clear  fluids. No signs of tamponade. 7. c.f. echo. of 01/02/2018, aortic stenosis appears moderate (it was mild before), mild AR is new, no other diagnostic change.  R + LHC 03/14/2018: Prox LAD 50% stenosis Bordelrine pulmonary hypertension No tampoande phsyiology  Cardiac MRI 03/13/2018: 1. Normal LV size and function EF 65% moderate LVH 2. Moderate to Large circumferential pericardial effusion biggest in the posterior and lateral dimensions with no diastolic RV collapse 3. Normal appearing pericardium 4. Mild subepicardial gadolinium uptake in the basal and mid inferior wall and basal antero  septum consistent with mild myocarditis 5. Tri leaflet AV with some restricted motion suggest echo correlation for degree of AS 6. Severe LAE  Assessment & Recommendations:  43 year old female with long-standing SLE, hypertension, type 2 diabetes mellitus, nonobstructive CAD, moderate aortic stenosis, recurrent pericardial effusion without tampoande physiology (cath 03/2018), recurrent atrial flutter, now status post ablation 05/2018, now admitted with tachycardia and worsening shortness of breath  Tachycardia: Afib w/RVR. Prior flutter ablation in 05/2018. Rate better controled today, still in120s with minimal activity.  Added PO diltiazem 240 mg. IV diltiazem weaned off Continue metoprolol tartrate 25 mg bid.  CHA2DS2VASc score 3. Annual stroke risk 3% Resumed eliquis 5 mg bid. Given Afib, she will now need to indefinitely on anticoagulation She has been on sotalol in the past. She had inadvertently come off it and did well. I would avoid antiarrhythmic therapy/cardivoersion given inadequate anticoagulation, at this time time.   Shortness of breath Improved after diuresis. Still has interstitial lung disease findings. Appreciate pulmonology consult. Needs outpatient follow up Continue lasix 40 mg PO bid  Pericardial effusion: Related to lupus. Clinically not in tamponade Resume  colchicine 0.6 mg bid  CAD: Continue Aspirin, statin  Moderate AS: Stable  SLE: Continue baseline plaquenil., Imuran.  DM: Continue baseline insulin. SSI  Anticipated discharge 04/29  [name], M.D. 12/09/2018, 3:18 PM [facility], [place] Pager: [phone] Office: [phone] If no answer: [phone]

## 2018-12-09 NOTE — TOC Initial Note (Signed)
Transition of Care [facility]) - Initial/Assessment Note    Patient Details  Name: [name] MRN: [id number] Date of Birth: [birth year]  Transition of [facility]) CM/SW Contact:    Bethena Roys, RN Phone Number: 12/09/2018, 12:38 PM  Clinical Narrative:   Pt presented for Atrial Fib- initially on Cardizem gtt now transitioned to po. Plan will be to see if heart rate is stable after ambulation and patient may be stable to transition home. Pt is from home with family support. Patient has PCP Archie Patten at the [facility] will call to schedule hospital follow up appointment. Pt states she has transportation to get to appointments and she utilizes the [facility] for medications. No further needs from CM at this time.                Expected Discharge Plan: Home/Self Care Barriers to Discharge: No Barriers Identified   Patient Goals and CMS Choice   CMS Medicare.gov Compare Post Acute Care list provided to:: (N/A)    Expected Discharge Plan and Services Expected Discharge Plan: Home/Self Care In-house Referral: NA Discharge Planning Services: CM Consult, Follow-up appt scheduled Post Acute Care Choice: NA Living arrangements for the past 2 months: Apartment                 DME Arranged: N/A DME Agency: NA       HH Arranged: NA [place] Agency: NA        Prior Living Arrangements/Services Living arrangements for the past 2 months: Apartment Lives with:: Relatives Patient language and need for interpreter reviewed:: Yes Do you feel safe going back to the place where you live?: Yes      Need for Family Participation in Patient Care: Yes (Comment) Care giver support system in place?: Yes (comment) Current home services: (N/A) Criminal Activity/Legal Involvement Pertinent to Current Situation/Hospitalization: No - Comment as needed  Activities of Daily Living Home Assistive Devices/Equipment: None ADL Screening (condition at time of admission) Patient's  cognitive ability adequate to safely complete daily activities?: Yes Is the patient deaf or have difficulty hearing?: No Does the patient have difficulty seeing, even when wearing glasses/contacts?: No Does the patient have difficulty concentrating, remembering, or making decisions?: No Patient able to express need for assistance with ADLs?: Yes Does the patient have difficulty dressing or bathing?: No Independently performs ADLs?: Yes (appropriate for developmental age) Does the patient have difficulty walking or climbing stairs?: No Weakness of Legs: None Weakness of Arms/Hands: None  Permission Sought/Granted Permission sought to share information with : Family Supports                Emotional Assessment Appearance:: Appears stated age Attitude/Demeanor/Rapport: Engaged Affect (typically observed): Accepting Orientation: : Oriented to Self, Oriented to Situation, Oriented to Place, Oriented to  Time Alcohol / Substance Use: Not Applicable Psych Involvement: No (comment)  Admission diagnosis:  Atrial fibrillation with rapid ventricular response ([place]) [I48.91] Patient Active Problem List   Diagnosis Date Noted  . Moderate aortic stenosis 11/19/2018  . 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 Hypokalemia 09/17/2018  . AKI (acute kidney injury) ([place]) 08/25/2018  . Encounter for annual routine gynecological examination 07/09/2018  . Abnormal vaginal Pap smear 07/09/2018  . Depo-Provera contraceptive status 07/09/2018  . A-fib ([place]) 05/12/2018  . Atrial flutter ([place]) 05/12/2018  . Pericardial effusion 01/16/2018  . Chronic diastolic CHF (congestive heart failure) ([place]) 01/16/2018  . SOB (shortness of breath) 12/25/2017  . Hypertension 12/25/2017  . Microcytic  anemia 12/25/2017  . Type 2 diabetes mellitus with hyperglycemia, without long-term current use of insulin ([place]) 12/25/2017  . Chronic pain of both knees 04/23/2017  . Fibroids 01/20/2015  .  Menorrhagia 01/20/2015  . Low grade squamous intraepithelial lesion (LGSIL) on cervical Pap smear on 01/20/15 01/20/2015  . Atypical glandular cells on cervical Pap smear on 01/20/15 01/20/2015  . Lupus ([place]) 07/15/2013   PCP:  Gildardo Pounds, NP Pharmacy:   [facility], [address] [place] [address] Phone: [phone] Fax: [facility] [address] [place] - [address] [place] [place] [address] Phone: [phone] Fax: [phone]     Social Determinants of Health (SDOH) Interventions    Readmission Risk Interventions No flowsheet data found.

## 2018-12-09 NOTE — Consult Note (Signed)
NAME:  [name], MRN:  [id number], DOB:  [birth year], LOS: 1 ADMISSION DATE:  12/08/2018, CONSULTATION DATE:  12/09/2018 REFERRING MD: Dr. Virgina Jock, CHIEF COMPLAINT: Shortness of breath  Brief History   Patient with a history of lupus Came in with a week history of worsening shortness of breath She has a chronic cough for about a year, usually dry, recently with clear phlegm No sick contacts No fevers or chills Lupus diagnosed in late 90s, initially treated with Plaquenil, currently on Plaquenil and Imuran History of cardiomyopathy Recently found to have a pleural effusion  History of present illness   Worsening shortness of breath Recently completed a course of steroids Was recently on steroids  Past Medical History   Past Medical History:  Diagnosis Date  . Abnormal Pap smear of cervix    colpo, HPV  . Allergy   . Anemia   . Atrial fibrillation ([place])   . CHF (congestive heart failure) ([place])   . Depression    after losses  . Enlarged heart    managed by cardiology  . Fibroid   . Gestational diabetes   . Heart murmur   . Hypertension   . Infection    UTI  . Lupus ([place]) [facility]     Consults:  PCCM 12/09/2018  Procedures:  None  Significant Diagnostic Tests:  High resolution CT scan showing fibrosis 09/24/2018 IMPRESSION: 1. 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 No frank honeycombing. Differential includes usual interstitial pneumonia (UIP) or fibrotic phase nonspecific interstitial pneumonia (NSIP). Findings are categorized as probable UIP per consensus guidelines: Diagnosis of Idiopathic Pulmonary Fibrosis: An Official ATS/ERS/JRS/ALAT Clinical Practice Guideline. [place], Iss 5, [id number], Apr 13 2017. 2. Moderate pericardial effusion. 3. Three-vessel coronary atherosclerosis. 4. Chronic  stable bilateral axillary and mediastinal lymphadenopathy, most compatible with benign etiology.  Echocardiogram 11/20/2018: Left ventricle cavity is normal in size. Moderate concentric hypertrophy of the left ventricle. Normal global wall motion. Doppler evidence of grade II (pseudonormal) diastolic dysfunction, elevated LAP. Calculated EF 63%. Left atrial cavity is severely dilated. Mild aortic valve leaflet thickening. Moderate aortic valve stenosis. Mild (Grade I) aortic regurgitation. Aortic valve mean gradient of 17 mmHg, Vmax of 2.8 m/s. Calculated aortic valve area by continuity equation is 1.2 cm. Moderate (Grade II) mitral regurgitation. Mild tricuspid regurgitation. Estimated pulmonary artery systolic pressure 25 mmHg. Moderate pericardial effusion with only small posterior loculation that measures 3.2 cm-large. No hemodynamic compromise. No significant change compared to previous study on 08/12/2018.  Micro Data:    Antimicrobials:  None  Interim history/subjective:  She feels a lot better since being in hospital Being diuresed  Objective   Blood pressure 135/81, pulse 80, temperature 98.7 F (37.1 C), temperature source Oral, resp. rate 17, height 5' (1.524 m), weight 103.3 kg, SpO2 94 %.        Intake/Output Summary (Last 24 hours) at 12/09/2018 1318 Last data filed at 12/09/2018 1000 Gross per 24 hour  Intake 728.85 ml  Output 650 ml  Net 78.85 ml   Filed Weights   12/08/18 1541 12/08/18 2036 12/09/18 0549  Weight: 104.3 kg 104 kg 103.3 kg    Examination: General: Middle-age lady, does not appear to be in distress, obese HENT: Moist oral mucosa Lungs: Few rales at the bases worse on the right Cardiovascular: S1-S2 appreciated Abdomen: Bowel sounds appreciated, soft nontender Extremities:  Mild edema, no clubbing Neuro: Alert and oriented x3 GU:   Resolved Hospital Problem list     Assessment & Plan:   Atrial fibrillation with rapid ventricular  response -Rate is better controlled at present -Symptom improvement -Continue to optimize treatment  Decompensated diastolic heart failure -Elevated BNP-appears to be trending higher -Marked cardiomegaly compared with previous -Significant improvement in symptoms with diuresis -No pulmonary hypertension on recent echo  Shortness of breath -Likely related to atrial fibrillation and associated possible decompensated heart failure -Improvement with better rate control and diuresis  Interstitial lung disease -Likely secondary to connective tissue disease -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 -Fibrosing interstitial lung disease  She does not appear to have an acute infectious process as a cause of exacerbation at present    At present we will recommend continue lines of care, optimize cardiac function Recent echocardiogram did not reveal any systolic dysfunction  Patient has not had previous pulmonary function tests-baseline PFT and diffusing capacity needed Markers of inflammation may be obtained when more stable-following optimization of treatment for heart failure decompensation/atrial fibrillation Goals of care will be optimizing treatment for her interstitial lung disease  Anti-fibrotic may be of benefit in fibrotic NSIP  Optimize DVT prophylaxis  Best practice:    DVT prophylaxis: On full dose Eliquis GI prophylaxis:  Glucose control: SSI Mobility: As tolerated Code Status: Full code Family Communication:  Disposition:   Labs   CBC: Recent Labs  Lab 12/08/18 1526  WBC 6.5  NEUTROABS 3.8  HGB 12.1  HCT 37.3  MCV 75.7*  PLT 893    Basic Metabolic Panel: Recent Labs  Lab 12/08/18 1526 12/08/18 1547 12/09/18 0347  NA 140  --  138  K 3.1*  --  3.0*  CL 101  --  102  CO2 27  --  24  GLUCOSE 156*  --  124*  BUN 7  --  6  CREATININE 0.85  --  0.74  CALCIUM 9.1  --  8.7*  MG  --  1.5* 1.8   GFR: Estimated Creatinine Clearance: 98.2  mL/min (by C-G formula based on SCr of 0.74 mg/dL). Recent Labs  Lab 12/08/18 1526  WBC 6.5    Liver Function Tests: Recent Labs  Lab 12/08/18 1526  AST 20  ALT 11  ALKPHOS 73  BILITOT 0.8  PROT 8.1  ALBUMIN 3.5   No results for input(s): LIPASE, AMYLASE in the last 168 hours. No results for input(s): AMMONIA in the last 168 hours.  ABG    Component Value Date/Time   HCO3 26.4 03/14/2018 1625   TCO2 28 03/14/2018 1625   O2SAT 78.0 03/14/2018 1625     Coagulation Profile: No results for input(s): INR, PROTIME in the last 168 hours.  Cardiac Enzymes: Recent Labs  Lab 12/08/18 1547  CKTOTAL 143    HbA1C: Hemoglobin A1C  Date/Time Value Ref Range Status  09/16/2018 03:40 PM 6.2 (A) 4.0 - 5.6 % Final  07/02/2018 11:16 AM 9.0 (A) 4.0 - 5.6 % Final   Hgb A1c MFr Bld  Date/Time Value Ref Range Status  03/13/2018 02:18 AM 9.1 (H) 4.8 - 5.6 % Final    Comment:    (NOTE) Pre diabetes:          5.7%-6.4% Diabetes:              >6.4% Glycemic control for   <7.0% adults with diabetes   12/25/2017 02:06 AM 6.9 (H) 4.8 - 5.6 % Final  Comment:    (NOTE) Pre diabetes:          5.7%-6.4% Diabetes:              >6.4% Glycemic control for   <7.0% adults with diabetes     CBG: Recent Labs  Lab 12/08/18 2127 12/09/18 0745 12/09/18 1256  GLUCAP 150* 133* 135*    Review of Systems:   Review of Systems  Constitutional: Negative.   HENT: Negative.   Eyes: Negative.   Respiratory: Positive for shortness of breath.   Cardiovascular: Positive for palpitations.  Gastrointestinal: Negative.   Genitourinary: Negative.   Musculoskeletal: Negative.   Skin: Negative.      Past Medical History  She,  has a past medical history of Abnormal Pap smear of cervix, Allergy, Anemia, Atrial fibrillation ([place]), CHF (congestive heart failure) ([place]), Depression, Enlarged heart, Fibroid, Gestational diabetes, Heart murmur, Hypertension, Infection, and Lupus ([place]) (1999).    Surgical History    Past Surgical History:  Procedure Laterality Date  . A-FLUTTER ABLATION N/A 06/10/2018   Procedure: A-FLUTTER ABLATION;  Surgeon: Evans Lance, MD;  Location: [facility];  Service: Cardiovascular;  Laterality: N/A;  . RIGHT/LEFT HEART CATH AND CORONARY ANGIOGRAPHY N/A 03/14/2018   Procedure: RIGHT/LEFT HEART CATH AND CORONARY ANGIOGRAPHY;  Surgeon: [name], MD;  Location: [facility];  Service: Cardiovascular;  Laterality: N/A;  . THERAPEUTIC ABORTION       Social History   reports that she has never smoked. She has never used smokeless tobacco. She reports current alcohol use. She reports that she does not use drugs.   Family History   Her family history includes Cancer in her maternal grandfather; Diabetes in her father and mother; Hypertension in her maternal grandmother. There is no history of Hearing loss.   Allergies Allergies  Allergen Reactions  . Carrot Oil Shortness Of Breath, Itching, Rash and Other (See Comments)    Bumps on tongue, also  . Celery Oil Shortness Of Breath, Itching, Rash and Other (See Comments)    Bumps on tongue, also  . Peanut-Containing Drug Products Anaphylaxis, Shortness Of Breath, Itching, Rash and Other (See Comments)    Bumps on tongue, also  . Septra [Bactrim] Hives

## 2018-12-10 ENCOUNTER — Inpatient Hospital Stay (HOSPITAL_COMMUNITY): Payer: 59

## 2018-12-10 LAB — GLUCOSE, CAPILLARY
Glucose-Capillary: 101 mg/dL — ABNORMAL HIGH (ref 70–99)
Glucose-Capillary: 111 mg/dL — ABNORMAL HIGH (ref 70–99)
Glucose-Capillary: 116 mg/dL — ABNORMAL HIGH (ref 70–99)

## 2018-12-10 LAB — BASIC METABOLIC PANEL
Anion gap: 13 (ref 5–15)
BUN: 10 mg/dL (ref 6–20)
CO2: 19 mmol/L — ABNORMAL LOW (ref 22–32)
Calcium: 9.2 mg/dL (ref 8.9–10.3)
Chloride: 105 mmol/L (ref 98–111)
Creatinine, Ser: 0.82 mg/dL (ref 0.44–1.00)
GFR calc Af Amer: >60 mL/min (ref >60)
GFR calc non Af Amer: >60 mL/min (ref >60)
Glucose, Bld: 109 mg/dL — ABNORMAL HIGH (ref 70–99)
Potassium: 4.3 mmol/L (ref 3.5–5.1)
Sodium: 137 mmol/L (ref 135–145)

## 2018-12-10 LAB — CBC
HCT: 33.7 % — ABNORMAL LOW (ref 36.0–46.0)
Hemoglobin: 11.2 g/dL — ABNORMAL LOW (ref 12.0–15.0)
MCH: 24.9 pg — ABNORMAL LOW (ref 26.0–34.0)
MCHC: 33.2 g/dL (ref 30.0–36.0)
MCV: 75.1 fL — ABNORMAL LOW (ref 80.0–100.0)
Platelets: 331 10*3/uL (ref 150–400)
RBC: 4.49 MIL/uL (ref 3.87–5.11)
RDW: 14.6 % (ref 11.5–15.5)
WBC: 5.9 10*3/uL (ref 4.0–10.5)
nRBC: 0 % (ref 0.0–0.2)

## 2018-12-10 LAB — ECHOCARDIOGRAM LIMITED
Height: 60 in
Weight: 3651.2 oz

## 2018-12-10 MED ORDER — FUROSEMIDE 10 MG/ML IJ SOLN
40.0000 mg | Freq: Once | INTRAMUSCULAR | Status: AC
  Administered 2018-12-10: 40 mg via INTRAVENOUS
  Filled 2018-12-10: qty 4

## 2018-12-10 MED ORDER — PREDNISONE 5 MG PO TABS: 5.0000 mg | ORAL_TABLET | Freq: Every day | ORAL | Status: DC

## 2018-12-10 MED ORDER — FUROSEMIDE 10 MG/ML IJ SOLN
40.0000 mg | Freq: Once | INTRAMUSCULAR | Status: AC
  Administered 2018-12-10: 05:00:00 40 mg via INTRAVENOUS
  Filled 2018-12-10: qty 4

## 2018-12-10 MED ORDER — METOPROLOL TARTRATE 25 MG PO TABS
37.5000 mg | ORAL_TABLET | Freq: Two times a day (BID) | ORAL | Status: DC
  Administered 2018-12-10: 37.5 mg via ORAL
  Filled 2018-12-10 (×2): qty 1

## 2018-12-10 MED FILL — CARTIA XT 120 MG CP24: 120 | 30 days supply | Qty: 30 | Fill #0

## 2018-12-10 MED FILL — POTASSIUM CL ER 20 MEQ TABL: 20 | 30 days supply | Qty: 120 | Fill #0

## 2018-12-10 NOTE — Consult Note (Addendum)
Cardiology Consultation:   Patient ID: [name] MRN: [id number]; DOB: [birth year]  Admit date: 12/08/2018 Date of Consult: 12/10/2018  Primary Care Provider: Gildardo Pounds, NP Primary Cardiologist: Dr. Virgina Jock Primary Electrophysiologist:  Dr. Lovena Le   Patient Profile:   [name] is a 43 y.o. female with a hx of HTN, non-obstructive CAD, VHD w/mod AS,  DM II, morbid obesity, SLE (on Plaquenil Imuran), recurrent mod pericardial effusion, AFlutter (ablated Oct 20129, off a/c and AAD) and very recently noted to have developed new findings or interstitial lung disease by CT, who is being seen today for the evaluation of new AFib w/RVR >> ST at the request of Dr. Virgina Jock.  Arrhythmia hx New onset AFlutter Sept 2019, CTI ablation 06/11/2019, Dr. Vernell Barrier New AFib: 11/2018 AAD: Sotalol, stopped post ablation, maintaining SR  History of Present Illness:   [name] was admitted to [facility] 12/08/2018 noted at her rheumatology appointment to be tachycardic, SOB, hypoxic, referred to the ER found in rapid AFib 140's and admitted for management of this felt to have component of diastolic HF, +/- component of interstital lung disease, no suspicion of tamponade with her known mod pericardial effusion.  Pulmonary consulting, EP asked to see the patient for rhythm management options, as well as thoughts on ST (chronically known to have PACs).  Current HR meds: Diltiazem 255m QD lopressor increased to 37.556mBID today  A/C: Eliquis, appropriately dosed  LABS K+ 4.3 Mag 1.8 BUN/Creat 10/0.82 WBBC 5.9 H/H 11/33 Plts 331  Currently, she feels well.  She has no chest pain or shortness of breath.  She can tell that her heart rate is more normal than it was prior to her admission.  She is converted back to sinus rhythm with quite a few PACs.   Past Medical History:  Diagnosis Date   Abnormal Pap smear of cervix    colpo, HPV   Allergy    Anemia    Atrial  fibrillation (HCC)    [facility]   Depression    after losses   Enlarged heart    managed by cardiology   Fibroid    Gestational diabetes    Heart murmur    Hypertension    Infection    UTI   Lupus [place]    Past Surgical History:  Procedure Laterality Date   A-FLUTTER ABLATION N/A 06/10/2018   Procedure: A-FLUTTER ABLATION;  Surgeon: TaEvans LanceMD;  [facility];  Service: Cardiovascular;  Laterality: N/A;   RIGHT/LEFT HEART CATH AND CORONARY ANGIOGRAPHY N/A 03/14/2018   Procedure: RIGHT/LEFT HEART CATH AND CORONARY ANGIOGRAPHY;  Surgeon: PaNigel MormonMD;  [facility];  Service: Cardiovascular;  Laterality: N/A;   THERAPEUTIC ABORTION       Home Medications:  Prior to Admission medications   Medication Sig Start Date End Date Taking? Authorizing Provider  acetaminophen (TYLENOL) 500 MG tablet Take 1,000 mg by mouth every 6 (six) hours as needed for headache (pain).   Yes [provider]  albuterol (PROVENTIL HFA;VENTOLIN HFA) 108 (90 Base) MCG/ACT inhaler Inhale 2 puffs into the lungs every 6 (six) hours as needed for wheezing or shortness of breath. 11/17/18  Yes FlGildardo PoundsNP  amLODipine (NORVASC) 10 MG tablet Take 10 mg by mouth daily.   Yes [provider]  aspirin EC 81 MG tablet Take 81 mg by mouth daily.   Yes [provider]  azaTHIOprine (IIlean Skill  50 MG tablet Take 50 mg by mouth 2 (two) times daily.   Yes [provider]  benzonatate (TESSALON) 200 MG capsule Take 1 capsule (200 mg total) by mouth 3 (three) times daily as needed for cough. 08/27/18  Yes Mikhail, Velta Addison, DO  cetirizine (ZYRTEC) 10 MG tablet Take 1 tablet (10 mg total) by mouth daily. 11/17/18 02/15/19 Yes Gildardo Pounds, NP  furosemide (LASIX) 40 MG tablet TAKE 1 TABLET (40 MG TOTAL) BY MOUTH 2 (TWO) TIMES DAILY. 11/20/18  Yes Gildardo Pounds, NP  hydroxychloroquine (PLAQUENIL) 200  MG tablet Take 1 tablet (200 mg total) by mouth 2 (two) times daily. 01/18/18  Yes Rai, Ripudeep K, MD  insulin glargine (LANTUS) 100 UNIT/ML injection Inject 0.08 mLs (8 Units total) into the skin daily before breakfast. Patient taking differently: Inject 16 Units into the skin daily before breakfast.  09/16/18  Yes Elsie Stain, MD  medroxyPROGESTERone (DEPO-PROVERA) 150 MG/ML injection Inject 150 mg into the muscle every 3 (three) months.   Yes [provider]  metFORMIN (GLUCOPHAGE) 1000 MG tablet Take 1 tablet (1,000 mg total) by mouth 2 (two) times daily with a meal. 09/16/18  Yes Elsie Stain, MD  metoprolol tartrate (LOPRESSOR) 25 MG tablet Take 1 tablet (25 mg total) by mouth 2 (two) times daily. 09/16/18  Yes Elsie Stain, MD  omeprazole (PRILOSEC) 20 MG capsule Take 1 capsule (20 mg total) by mouth daily as needed (for heartburn or GERD-like symptoms). Patient taking differently: Take 20 mg by mouth daily.  09/16/18 01/14/19 Yes Elsie Stain, MD  Phenyleph-CPM-DM-Aspirin (ALKA-SELTZER PLUS COLD & COUGH PO) Take 2 tablets by mouth every 4 (four) hours as needed (cough/congestion).   Yes [provider]  potassium chloride (K-DUR) 10 MEQ tablet Take 1 tablet (10 mEq total) by mouth daily. 09/17/18  Yes Elsie Stain, MD  rosuvastatin (CRESTOR) 20 MG tablet Take 1 tablet (20 mg total) by mouth daily at 6 PM. 09/16/18  Yes Elsie Stain, MD  acetaminophen (TYLENOL) 325 MG tablet Take 2 tablets (650 mg total) by mouth every 4 (four) hours as needed for headache or mild pain. Patient not taking: Reported on 12/08/2018 12/28/17   Georgette Shell, MD  amLODipine (NORVASC) 5 MG tablet Take 1 tablet (5 mg total) by mouth daily. Patient not taking: Reported on 12/08/2018 09/16/18   Elsie Stain, MD  apixaban (ELIQUIS) 5 MG TABS tablet Take 1 tablet (5 mg total) by mouth 2 (two) times daily. 12/09/18   Patwardhan, Reynold Bowen, MD  Blood Glucose Monitoring Suppl (TRUE  METRIX METER) w/Device KIT Check blood sugars fasting and at bedtime 01/30/18   Argentina Donovan, PA-C  colchicine 0.6 MG tablet Take 1 tablet (0.6 mg total) by mouth 2 (two) times daily. 12/09/18   Patwardhan, Reynold Bowen, MD  diltiazem (CARDIZEM CD) 240 MG 24 hr capsule Take 1 capsule (240 mg total) by mouth daily. 12/10/18   Patwardhan, Reynold Bowen, MD  glucose blood (TRUE METRIX BLOOD GLUCOSE TEST) test strip Use as instructed 07/02/18   Gildardo Pounds, NP  potassium chloride SA (K-DUR) 20 MEQ tablet Take 2 tablets (40 mEq total) by mouth 2 (two) times daily. 12/09/18   [name], MD  TRUEPLUS LANCETS 28G MISC Check blood sugars as directed 01/30/18   Argentina Donovan, PA-C    Inpatient Medications: Scheduled Meds:  apixaban  5 mg Oral BID   azaTHIOprine  50 mg Oral BID  colchicine  0.6 mg Oral BID   diltiazem  240 mg Oral Daily   furosemide  40 mg Intravenous Once   furosemide  40 mg Oral BID   hydroxychloroquine  200 mg Oral BID   insulin aspart  0-15 Units Subcutaneous TID WC   insulin aspart  6 Units Subcutaneous TID WC   insulin glargine  16 Units Subcutaneous QAC breakfast   metoprolol tartrate  37.5 mg Oral BID   pantoprazole  40 mg Oral Daily   [START ON 12/11/2018] predniSONE  5 mg Oral Q breakfast   rosuvastatin  20 mg Oral q1800   Continuous Infusions:  PRN Meds: acetaminophen, albuterol, benzonatate, guaiFENesin-dextromethorphan, menthol-cetylpyridinium, ondansetron (ZOFRAN) IV  Allergies:    Allergies  Allergen Reactions   Carrot Oil Shortness Of Breath, Itching, Rash and Other (See Comments)    Bumps on tongue, also   Celery Oil Shortness Of Breath, Itching, Rash and Other (See Comments)    Bumps on tongue, also   Peanut-Containing Drug Products Anaphylaxis, Shortness Of Breath, Itching, Rash and Other (See Comments)    Bumps on tongue, also   Septra [Bactrim] Hives    Social History:   Social History   Socioeconomic History    Marital status: Married    Spouse name: Not on file   Number of children: 2   Years of education: Not on file   Highest education level: Not on file  Occupational History   Not on file  Social Needs   Financial resource strain: Not on file   Food insecurity:    Worry: Patient refused    Inability: Patient refused   Transportation needs:    Medical: Patient refused    Non-medical: Patient refused  Tobacco Use   Smoking status: Never Smoker   Smokeless tobacco: Never Used  Substance and Sexual Activity   Alcohol use: Yes    Comment: ocasionally    Drug use: No   Sexual activity: Yes    Birth control/protection: Injection  Lifestyle   Physical activity:    Days per week: Patient refused    Minutes per session: Patient refused   Stress: Not on file  Relationships   Social connections:    Talks on phone: Patient refused    Gets together: Patient refused    Attends religious service: Patient refused    Active member of club or organization: Patient refused    Attends meetings of clubs or organizations: Patient refused    Relationship status: Patient refused   Intimate partner violence:    Fear of current or ex partner: Patient refused    Emotionally abused: Patient refused    Physically abused: Patient refused    Forced sexual activity: Patient refused  Other Topics Concern   Not on file  Social History Narrative   Not on file    Family History:   Family History  Problem Relation Age of Onset   Cancer Maternal Grandfather        lung   Diabetes Mother    Hypertension Maternal Grandmother    Diabetes Father    Hearing loss Neg Hx      ROS:  Please see the history of present illness.  All other ROS reviewed and negative.     Physical Exam/Data:   Vitals:   12/09/18 1939 12/10/18 0433 12/10/18 0715 12/10/18 0847  BP: (!) 159/95 (!) 140/95 99/63 (!) 123/95  Pulse: (!) 110 91  (!) 113  Resp:      Temp:  98.8 F (37.1 C) 98.4 F (36.9  C)    TempSrc: Oral Oral    SpO2: 91% 93% 96%   Weight:  103.5 kg    Height:        Intake/Output Summary (Last 24 hours) at 12/10/2018 1313 Last data filed at 12/09/2018 2300 Gross per 24 hour  Intake 240 ml  Output --  Net 240 ml   Last 3 Weights 12/10/2018 12/09/2018 12/08/2018  Weight (lbs) 228 lb 3.2 oz 227 lb 11.2 oz 229 lb 4.5 oz  Weight (kg) 103.511 kg 103.284 kg 104 kg     Body mass index is 44.57 kg/m.  General:  Well nourished, well developed, in no acute distress HEENT: normal Lymph: no adenopathy Neck: no JVD Endocrine:  No thryomegaly Vascular: No carotid bruits; FA pulses 2+ bilaterally without bruits  Cardiac: Irregular, tachycardic, no murmur  Lungs:  clear to auscultation bilaterally, no wheezing, rhonchi or rales  Abd: soft, nontender, no hepatomegaly  Ext: no edema Musculoskeletal:  No deformities, BUE and BLE strength normal and equal Skin: warm and dry  Neuro:  CNs 2-12 intact, no focal abnormalities noted Psych:  Normal affect   EKG:  The EKG was personally reviewed and demonstrates:   AF 160bpm Today is ST,121bpm, frequent PACs, agree with QTc of 482 Telemetry:  Telemetry was personally reviewed and demonstrates:   ST, 110's, very frequent often grouped PACs,   Relevant CV Studies:  Echocardiogram 11/20/2018: Left ventricle cavity is normal in size. Moderate concentric hypertrophy of the left ventricle. Normal global wall motion. Doppler evidence of grade II (pseudonormal) diastolic dysfunction, elevated LAP. Calculated EF 63%. Left atrial cavity is severely dilated. Mild aortic valve leaflet thickening. Moderate aortic valve stenosis. Mild (Grade I) aortic regurgitation. Aortic valve mean gradient of 17 mmHg, Vmax of 2.8 m/s. Calculated aortic valve area by continuity equation is 1.2 cm. Moderate (Grade II) mitral regurgitation. Mild tricuspid regurgitation. Estimated pulmonary artery systolic pressure 25 mmHg. Moderate pericardial effusion with  only small posterior loculation that measures 3.2 cm-large. No hemodynamic compromise. No significant change compared to previous study on 08/12/2018.  CT Chest 09/24/2018: 1. 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 No frank honeycombing. Differential includes usual interstitial pneumonia (UIP) or fibrotic phase nonspecific interstitial pneumonia (NSIP). Findings are categorized as probable UIP per consensus guidelines: Diagnosis of Idiopathic Pulmonary Fibrosis: An Official ATS/ERS/JRS/ALAT Clinical Practice Guideline. [place], Iss 5, [phone], Apr 13 2017. 2. Moderate pericardial effusion. 3. Three-vessel coronary atherosclerosis. 4. Chronic stable bilateral axillary and mediastinal lymphadenopathy, most compatible with benign etiology.  Echocardiogram 08/12/2018: 1. Left ventricle cavity is normal in size. Moderate concentric hypertrophy of the left ventricle. Normal global wall motion. Doppler evidence of grade II (pseudonormal) diastolic dysfunction. Calculated EF 56%. 2. Left atrial cavity is severely dilated. 3. Mild (Grade I) aortic regurgitation. Mild aortic valve leaflet calcification. Moderately restricted aortic valve leaflets. Mild to Moderate aortic valve stenosis with peak gradient 45.6, mean gradient 23.7 mm of Hg . Calculated valve area is 1.26 cm2. 4. Trace mitral regurgitation. Mild calcification of the mitral valve annulus. 5. Trace tricuspid regurgitation. 6. Moderate pericardial effusion (more marked in post. and lateral part) with clear fluids. No signs of tamponade. 7. c.f. echo. of 01/02/2018, aortic stenosis appears moderate (it was mild before), mild AR is new, no other diagnostic change.  R + LHC 03/14/2018: Prox LAD 50% stenosis Bordelrine  pulmonary hypertension No tampoande phsyiology  Cardiac MRI 03/13/2018: 1.  Normal LV size and function EF 65% moderate LVH 2. Moderate to Large circumferential pericardial effusion biggest in the posterior and lateral dimensions with no diastolic RV collapse 3. Normal appearing pericardium 4. Mild subepicardial gadolinium uptake in the basal and mid inferior wall and basal antero septum consistent with mild myocarditis 5. Tri leaflet AV with some restricted motion suggest echo correlation for degree of AS 6. Severe LAE   Laboratory Data:  Chemistry Recent Labs  Lab 12/08/18 1526 12/09/18 0347 12/10/18 0320  NA 140 138 137  K 3.1* 3.0* 4.3  CL 101 102 105  CO2 27 24 19*  GLUCOSE 156* 124* 109*  BUN _0 CREATININE 0.85 0.74 0.82  CALCIUM 9.1 8.7* 9.2  GFRNONAA >60 >60 >60  GFRAA >60 >60 >60  ANIONGAP _1 Recent Labs  Lab 12/08/18 1526  PROT 8.1  ALBUMIN 3.5  AST 20  ALT 11  ALKPHOS 73  BILITOT 0.8   Hematology Recent Labs  Lab 12/08/18 1526 12/10/18 0854  WBC 6.5 5.9  RBC 4.93 4.49  HGB 12.1 11.2*  HCT 37.3 33.7*  MCV 75.7* 75.1*  MCH 24.5* 24.9*  MCHC 32.4 33.2  RDW 14.7 14.6  PLT 361 331   Cardiac EnzymesNo results for input(s): TROPONINI in the last 168 hours. No results for input(s): TROPIPOC in the last 168 hours.  BNP Recent Labs  Lab 12/08/18 1526  BNP 351.5*    DDimer No results for input(s): DDIMER in the last 168 hours.  Radiology/Studies:   Dg Chest 2 View Result Date: 12/10/2018 CLINICAL DATA:  Shortness of breath and tachycardia EXAM: CHEST - 2 VIEW COMPARISON:  December 09, 2018 and December 08, 2018 FINDINGS: There remains multifocal airspace opacity throughout the right lung, essentially stable compared to 1 day prior but representing a definite change from 2 days prior. Left lung is clear. There is cardiomegaly with pulmonary vascularity normal. No adenopathy. No bone lesions. IMPRESSION: Multifocal airspace opacity throughout the right lung, felt to represent multifocal pneumonia. Left lung clear.  Stable cardiomegaly. No evident adenopathy. Electronically Signed   By: Lowella Grip III M.D.   On: 12/10/2018 12:07       Assessment and Plan:   1. New onset AFib     She was rate controlled with dilt and metoprolol with spontaneous conversion to SR/ST    CHA2DS2Vasc is 4 (female, HTN, DM, vascular disease), resumed on a/c  C.MRI Aug 2019, as well as echo 07/2028, and this month all noted severe LAE,  and Interstitial lung disease makes it likely that she Donda Friedli very likely to have more AF, and other atrial arrhythmias.  AAD options are few if any Amiodarone is not a choice given interstitial lung disease (and young age) Agree, QT likely a bot to long for resuming sotalol or use of Tikosyn [place] LAD disease on cath, not likely a Flecainide (1c) candidate  Her ECG certainly appear to show atrial fibrillation.  I reviewed her telemetry which shows atrial fibrillation as well as episodes where she may have P waves and could be a multifocal atrial tachycardia.  She is currently in sinus tachycardia with multiple PACs as well.  At this point, I would hold off on antiarrhythmic medications, as there is not one medication that would be a good option for her.  Would titrate her calcium channel blockers and beta-blockers to affect.  Agree with anticoagulation.  2. ST     May be 2/2 lungs, ? Pneumonia, diastolic HF     Follow as these are treated        For questions or updates, please contact [place] Please consult [email or web address] for contact info under     Signed, Adarsh Mundorf Meredith Leeds, MD  12/10/2018 1:13 PM

## 2018-12-10 NOTE — Progress Notes (Signed)
Subjective:  Patient was feeling better, with improvement in heart rate. However, she has had increasing cough since 04/28 evening, now has sinus tachycardia  Objective:  Vital Signs in the last 24 hours: Temp:  [98.4 F (36.9 C)-98.8 F (37.1 C)] 98.4 F (36.9 C) (04/29 0433) Pulse Rate:  [91-113] 113 (04/29 0847) BP: (99-159)/(63-95) 123/95 (04/29 0847) SpO2:  [91 %-96 %] 96 % (04/29 0715) Weight:  [103.5 kg] 103.5 kg (04/29 0433)  Intake/Output from previous day: 04/28 0701 - 04/29 0700 In: 488.9 [P.O.:240; I.V.:248.9] Out: -   Physical Exam  Constitutional: She is oriented to person, place, and time. She appears well-developed and well-nourished. No distress.  HENT:  Head: Normocephalic and atraumatic.  Eyes: Pupils are equal, round, and reactive to light. Conjunctivae are normal.  Neck: No JVD present.  Cardiovascular: Intact distal pulses. An irregularly irregular rhythm present. Tachycardia present.  Pulmonary/Chest: Effort normal and breath sounds normal. She has no wheezes. She has no rales.  Abdominal: Soft. Bowel sounds are normal. There is no rebound.  Musculoskeletal:        General: No edema.  Lymphadenopathy:    She has no cervical adenopathy.  Neurological: She is alert and oriented to person, place, and time. No cranial nerve deficit.  Skin: Skin is warm and dry.  Psychiatric: She has a normal mood and affect.  Nursing note and vitals reviewed.    Lab Results: BMP Recent Labs    12/08/18 1526 12/09/18 0347 12/10/18 0320  NA 140 138 137  K 3.1* 3.0* 4.3  CL 101 102 105  CO2 27 24 19*  GLUCOSE 156* 124* 109*  BUN 7 6 10   CREATININE 0.85 0.74 0.82  CALCIUM 9.1 8.7* 9.2  GFRNONAA >60 >60 >60  GFRAA >60 >60 >60    CBC Recent Labs  Lab 12/08/18 1526 12/10/18 0854  WBC 6.5 5.9  RBC 4.93 4.49  HGB 12.1 11.2*  HCT 37.3 33.7*  PLT 361 331  MCV 75.7* 75.1*  MCH 24.5* 24.9*  MCHC 32.4 33.2  RDW 14.7 14.6  LYMPHSABS 1.7  --   MONOABS 0.8   --   EOSABS 0.2  --   BASOSABS 0.0  --     HEMOGLOBIN A1C Lab Results  Component Value Date   HGBA1C 6.2 (A) 09/16/2018   MPG 214.47 03/13/2018    Cardiac Panel (last 3 results) Recent Labs    03/13/18 0952 03/13/18 1543 03/13/18 2045 12/08/18 1547  CKTOTAL  --   --   --  143  TROPONINI 0.03* 0.03* 0.03*  --     BNP (last 3 results) Recent Labs    03/13/18 0218 05/12/18 1002 12/08/18 1526  BNP 229.2* 302.7* 351.5*    TSH Recent Labs    12/25/17 0206 03/13/18 0218 05/12/18 2022  TSH 5.026* 1.041 0.817    Lipid Panel     Component Value Date/Time   CHOL  08/18/2008 1746    179        ATP III CLASSIFICATION:  <200     mg/dL   Desirable  200-239  mg/dL   Borderline High  >=240    mg/dL   High          TRIG 100 08/18/2008 1746   HDL 37 (L) 08/18/2008 1746   CHOLHDL 4.8 08/18/2008 1746   VLDL 20 08/18/2008 1746   LDLCALC (H) 08/18/2008 1746    122        Total Cholesterol/HDL:CHD Risk Coronary Heart Disease Risk  Table                     Men   Women  1/2 Average Risk   3.4   3.3  Average Risk       5.0   4.4  2 X Average Risk   9.6   7.1  3 X Average Risk  23.4   11.0        Use the calculated Patient Ratio above and the CHD Risk Table to determine the patient's CHD Risk.        ATP III CLASSIFICATION (LDL):  <100     mg/dL   Optimal  100-129  mg/dL   Near or Above                    Optimal  130-159  mg/dL   Borderline  160-189  mg/dL   High  >190     mg/dL   Very High     Hepatic Function Panel Recent Labs    03/13/18 0312 08/25/18 1611 12/08/18 1526  PROT 8.2* 8.5* 8.1  ALBUMIN 3.5 3.9 3.5  AST 18 19 20   ALT 13 10 11   ALKPHOS 59 45 73  BILITOT 0.6 0.6 0.8   CARDIAC STUDIES:  EKG 12/08/2018: Afib w/RVR  Echocardiogram 11/20/2018: Left ventricle cavity is normal in size. Moderate concentric hypertrophy of the left ventricle. Normal global wall motion. Doppler evidence of grade II (pseudonormal) diastolic dysfunction,  elevated LAP. Calculated EF 63%. Left atrial cavity is severely dilated. Mild aortic valve leaflet thickening. Moderate aortic valve stenosis. Mild (Grade I) aortic regurgitation. Aortic valve mean gradient of 17 mmHg, Vmax of 2.8 m/s. Calculated aortic valve area by continuity equation is 1.2 cm. Moderate (Grade II) mitral regurgitation. Mild tricuspid regurgitation. Estimated pulmonary artery systolic pressure 25 mmHg. Moderate pericardial effusion with only small posterior loculation that measures 3.2 cm-large. No hemodynamic compromise. No significant change compared to previous study on 08/12/2018.  CT Chest 09/24/2018: 1. 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 No frank honeycombing. Differential includes usual interstitial pneumonia (UIP) or fibrotic phase nonspecific interstitial pneumonia (NSIP). Findings are categorized as probable UIP per consensus guidelines: Diagnosis of Idiopathic Pulmonary Fibrosis: An Official ATS/ERS/JRS/ALAT Clinical Practice Guideline. [place], Iss 5, [phone], Apr 13 2017. 2. Moderate pericardial effusion. 3. Three-vessel coronary atherosclerosis. 4. Chronic stable bilateral axillary and mediastinal lymphadenopathy, most compatible with benign etiology.  Echocardiogram 08/12/2018: 1. Left ventricle cavity is normal in size. Moderate concentric hypertrophy of the left ventricle. Normal global wall motion. Doppler evidence of grade II (pseudonormal) diastolic dysfunction. Calculated EF 56%. 2. Left atrial cavity is severely dilated. 3. Mild (Grade I) aortic regurgitation. Mild aortic valve leaflet calcification. Moderately restricted aortic valve leaflets. Mild to Moderate aortic valve stenosis with peak gradient 45.6, mean gradient 23.7 mm of Hg . Calculated valve area is 1.26 cm2. 4. Trace mitral regurgitation. Mild  calcification of the mitral valve annulus. 5. Trace tricuspid regurgitation. 6. Moderate pericardial effusion (more marked in post. and lateral part) with clear fluids. No signs of tamponade. 7. c.f. echo. of 01/02/2018, aortic stenosis appears moderate (it was mild before), mild AR is new, no other diagnostic change.  R + LHC 03/14/2018: Prox LAD 50% stenosis Bordelrine pulmonary hypertension No tampoande phsyiology  Cardiac MRI 03/13/2018: 1. Normal LV size and function EF 65% moderate LVH 2. Moderate  to Large circumferential pericardial effusion biggest in the posterior and lateral dimensions with no diastolic RV collapse 3. Normal appearing pericardium 4. Mild subepicardial gadolinium uptake in the basal and mid inferior wall and basal antero septum consistent with mild myocarditis 5. Tri leaflet AV with some restricted motion suggest echo correlation for degree of AS 6. Severe LAE  Assessment & Recommendations:  43 year old female with long-standing SLE, hypertension, type 2 diabetes mellitus, nonobstructive CAD, moderate aortic stenosis, recurrent pericardial effusion without tampoande physiology (cath 03/2018), recurrent atrial flutter, now status post ablation 05/2018, now admitted with tachycardia and worsening shortness of breath  Tachycardia: Afib w/RVR on admission. Prior flutter ablation in 05/2018. Now in sinus tachycardia with frequent PAC's Currently on metoprolol 25 mg bid and diltiazem 240 mg daily. CHA2DS2VASc score 3. Annual stroke risk 3% Resumed eliquis 5 mg bid. Given Afib, she will now need to indefinitely on anticoagulation I do think her sinus tachycardia could be secondary to cough, tamponade is less likely. She has had stable moderate pericardial effusion with no change. I do believe the pericardial effusion is related to her lupus. I will obtain a limited echocardiogram. She has been on sotalol in the past. She had inadvertently come off it  and did well. Her QTc is prolonged while in sinus rhythm, precluding use of sotalol. I will touch base with EP regarding the same  Shortness of breath Improved after diuresis. Still has interstitial lung disease findings. Appreciate pulmonology consult. Needs outpatient follow up Continue lasix 40 mg PO bid with additional dose of IV lasix today.  Pericardial effusion: Related to lupus. Clinically not in tamponade Resume colchicine 0.6 mg bid  CAD: Continue Aspirin, statin  Moderate AS: Stable  SLE: Continue baseline plaquenil., Imuran. Discussed with her rheumatologist Dr. Pearline Cables. Will discharge her on prednisone 20 mg daily for 7 days.   DM: Continue baseline insulin. SSI  Anticipated discharge 04/29  [name], M.D. 12/10/2018, 12:02 PM [facility] Pager: [phone] Office: [phone] If no answer: [phone]

## 2018-12-10 NOTE — Progress Notes (Signed)
NAME:  [name], MRN:  [id number], DOB:  [birth year], LOS: 2 ADMISSION DATE:  12/08/2018, CONSULTATION DATE:  12/09/2018 REFERRING MD: Dr. Virgina Jock, CHIEF COMPLAINT: Shortness of breath  Brief History   Patient with a history of lupus Came in with 1  week history of worsening shortness of breath She has had  a chronic cough for about a year, usually dry, recently with clear phlegm Denies sick contacts Denies fevers or chills Lupus diagnosed in late 90s, initially treated with Plaquenil, currently on Plaquenil and Imuran History of cardiomyopathy Recently found to have a pleural effusion  History of present illness   Worsening shortness of breath Recently completed a course of steroids Was recently on steroids which she said did help her chronic cough  Past Medical History   Past Medical History:  Diagnosis Date  . Abnormal Pap smear of cervix    colpo, HPV  . Allergy   . Anemia   . Atrial fibrillation ([place])   . CHF (congestive heart failure) ([place])   . Depression    after losses  . Enlarged heart    managed by cardiology  . Fibroid   . Gestational diabetes   . Heart murmur   . Hypertension   . Infection    UTI  . Lupus ([place]) [place] [facility] Events     Consults:  PCCM 12/09/2018  Procedures:  None  Significant Diagnostic Tests:  High resolution CT scan showing fibrosis 09/24/2018 IMPRESSION: 1. 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 No frank honeycombing. Differential includes usual interstitial pneumonia (UIP) or fibrotic phase nonspecific interstitial pneumonia (NSIP). Findings are categorized as probable UIP per consensus guidelines: Diagnosis of Idiopathic Pulmonary Fibrosis: An Official ATS/ERS/JRS/ALAT Clinical Practice Guideline. [place], Iss 5, [phone], Apr 13 2017. 2. Moderate pericardial effusion. 3.  Three-vessel coronary atherosclerosis. 4. Chronic stable bilateral axillary and mediastinal lymphadenopathy, most compatible with benign etiology.  Echocardiogram 11/20/2018: Left ventricle cavity is normal in size. Moderate concentric hypertrophy of the left ventricle. Normal global wall motion. Doppler evidence of grade II (pseudonormal) diastolic dysfunction, elevated LAP. Calculated EF 63%. Left atrial cavity is severely dilated. Mild aortic valve leaflet thickening. Moderate aortic valve stenosis. Mild (Grade I) aortic regurgitation. Aortic valve mean gradient of 17 mmHg, Vmax of 2.8 m/s. Calculated aortic valve area by continuity equation is 1.2 cm. Moderate (Grade II) mitral regurgitation. Mild tricuspid regurgitation. Estimated pulmonary artery systolic pressure 25 mmHg. Moderate pericardial effusion with only small posterior loculation that measures 3.2 cm-large. No hemodynamic compromise. No significant change compared to previous study on 08/12/2018.  Micro Data:    Antimicrobials:  None  Interim history/subjective:  She feels a lot better since being in hospital Tolerating  Diuresis States  Cough is bothering her, worse when she is lying down  Objective   Blood pressure (!) 123/95, pulse (!) 113, temperature 98.4 F (36.9 C), temperature source Oral, resp. rate 17, height 5' (1.524 m), weight 103.5 kg, SpO2 96 %.        Intake/Output Summary (Last 24 hours) at 12/10/2018 1121 Last data filed at 12/09/2018 2300 Gross per 24 hour  Intake 240 ml  Output -  Net 240 ml   Filed Weights   12/08/18 2036 12/09/18 0549 12/10/18 0433  Weight: 104 kg 103.3 kg 103.5 kg    Examination: General: Middle-age lady, does not appear to be in distress,  obese, sitting on side of bed HENT: NCAT, thick neck, Moist oral mucosa, No LAD Lungs: Bilateral excursion, Crackles per right base Cardiovascular: S1-S2 appreciated, A fib per tele, rate 114 Abdomen: Bowel sounds appreciated, soft  nontender, ND, Obese Extremities: No obvious deformities.,Mild edema, no clubbing Neuro: Alert and oriented x3, MAE x 4, appropriate GU:Not assessed  Resolved Hospital Problem list     Assessment & Plan:   Atrial fibrillation with rapid ventricular response -Rate  Control is goal - Appreciate cards input - symptom management -Continue to optimize treatment  Decompensated diastolic heart failure -Elevated BNP-appears to be trending higher>> trend for improvement -Marked cardiomegaly compared with previous>> Diuresis per primary team -Significant improvement in symptoms with diuresis, is essentially euvolemic -No pulmonary hypertension on recent echo   Shortness of breath>> improved and off room air -Likely related to atrial fibrillation and associated possible decompensated heart failure -Improvement with better rate control and diuresis - Sat goals are 88-92%  Interstitial lung disease No baseline PFT's, DLCO -Likely secondary to connective tissue disease -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 -Fibrosing interstitial lung disease Plan: Outpatient follow up with PFT's and 6 minute walk Inflammatory markers at OP  follow up , once more stable  Consider anti-fibrotic medications based on OP labs, PFT results Goal is to optimize treatment of ILD  Dry cough Continue Tessalon and Robitussin prn  Continue Protonix Consider GERD diet Consider addition of prednisone 10 mg daily x 14 days   She does not appear to have an acute infectious process as a cause of exacerbation at present >> afebrile and normal WBC>> but CXR with ? Multifocal pneumonia>> consider  checking  pro calcitonin to further evaluate   Optimize DVT prophylaxis>> Eliquis  Best practice:    DVT prophylaxis: On full dose Eliquis GI prophylaxis: Protonix Glucose control: SSI, CBG Mobility: As tolerated Code Status: Full code Family Communication:  Disposition: Telemetry  Labs   CBC:  Recent Labs  Lab 12/08/18 1526 12/10/18 0854  WBC 6.5 5.9  NEUTROABS 3.8  --   HGB 12.1 11.2*  HCT 37.3 33.7*  MCV 75.7* 75.1*  PLT 361 810    Basic Metabolic Panel: Recent Labs  Lab 12/08/18 1526 12/08/18 1547 12/09/18 0347 12/10/18 0320  NA 140  --  138 137  K 3.1*  --  3.0* 4.3  CL 101  --  102 105  CO2 27  --  24 19*  GLUCOSE 156*  --  124* 109*  BUN 7  --  6 10  CREATININE 0.85  --  0.74 0.82  CALCIUM 9.1  --  8.7* 9.2  MG  --  1.5* 1.8  --    GFR: Estimated Creatinine Clearance: 95.9 mL/min (by C-G formula based on SCr of 0.82 mg/dL). Recent Labs  Lab 12/08/18 1526 12/10/18 0854  WBC 6.5 5.9    Liver Function Tests: Recent Labs  Lab 12/08/18 1526  AST 20  ALT 11  ALKPHOS 73  BILITOT 0.8  PROT 8.1  ALBUMIN 3.5   No results for input(s): LIPASE, AMYLASE in the last 168 hours. No results for input(s): AMMONIA in the last 168 hours.  ABG    Component Value Date/Time   HCO3 26.4 03/14/2018 1625   TCO2 28 03/14/2018 1625   O2SAT 78.0 03/14/2018 1625     Coagulation Profile: No results for input(s): INR, PROTIME in the last 168 hours.  Cardiac Enzymes: Recent Labs  Lab 12/08/18 1547  CKTOTAL 143  HbA1C: Hemoglobin A1C  Date/Time Value Ref Range Status  09/16/2018 03:40 PM 6.2 (A) 4.0 - 5.6 % Final  07/02/2018 11:16 AM 9.0 (A) 4.0 - 5.6 % Final   Hgb A1c MFr Bld  Date/Time Value Ref Range Status  03/13/2018 02:18 AM 9.1 (H) 4.8 - 5.6 % Final    Comment:    (NOTE) Pre diabetes:          5.7%-6.4% Diabetes:              >6.4% Glycemic control for   <7.0% adults with diabetes   12/25/2017 02:06 AM 6.9 (H) 4.8 - 5.6 % Final    Comment:    (NOTE) Pre diabetes:          5.7%-6.4% Diabetes:              >6.4% Glycemic control for   <7.0% adults with diabetes     CBG: Recent Labs  Lab 12/09/18 0745 12/09/18 1256 12/09/18 1705 12/09/18 2119 12/10/18 0751  GLUCAP 133* 135* 98 138* 101*      Past Medical History  She,   has a past medical history of Abnormal Pap smear of cervix, Allergy, Anemia, Atrial fibrillation ([place]), CHF (congestive heart failure) ([place]), Depression, Enlarged heart, Fibroid, Gestational diabetes, Heart murmur, Hypertension, Infection, and Lupus ([place]) (1999).   Surgical History    Past Surgical History:  Procedure Laterality Date  . A-FLUTTER ABLATION N/A 06/10/2018   Procedure: A-FLUTTER ABLATION;  Surgeon: Evans Lance, MD;  Location: [facility];  Service: Cardiovascular;  Laterality: N/A;  . RIGHT/LEFT HEART CATH AND CORONARY ANGIOGRAPHY N/A 03/14/2018   Procedure: RIGHT/LEFT HEART CATH AND CORONARY ANGIOGRAPHY;  Surgeon: [name], MD;  Location: [facility];  Service: Cardiovascular;  Laterality: N/A;  . THERAPEUTIC ABORTION       Social History   reports that she has never smoked. She has never used smokeless tobacco. She reports current alcohol use. She reports that she does not use drugs.   Family History   Her family history includes Cancer in her maternal grandfather; Diabetes in her father and mother; Hypertension in her maternal grandmother. There is no history of Hearing loss.   Allergies Allergies  Allergen Reactions  . Carrot Oil Shortness Of Breath, Itching, Rash and Other (See Comments)    Bumps on tongue, also  . Celery Oil Shortness Of Breath, Itching, Rash and Other (See Comments)    Bumps on tongue, also  . Peanut-Containing Drug Products Anaphylaxis, Shortness Of Breath, Itching, Rash and Other (See Comments)    Bumps on tongue, also  . Septra [Bactrim] Hives

## 2018-12-10 NOTE — Progress Notes (Signed)
Echocardiogram 2D Echocardiogram has been performed.  Matilde Bash 12/10/2018, 3:07 PM

## 2018-12-10 NOTE — TOC Transition Note (Signed)
Transition of Care [facility]) - CM/SW Discharge Note   Patient Details  Name: NONA GRACEY MRN: [id number] Date of Birth: 1975/10/05  [facility] CM/SW Contact:  Bethena Roys, RN Phone Number: 12/10/2018, 11:05 AM   Clinical Narrative: CM did speak with patient and we went through medications. Patient has Colchicine  & Eliquis at home. Medications filled will be potassium and Cardizem. No further needs from CM at this time.       Final next level of care: Home/Self Care Barriers to Discharge: No Barriers Identified   Patient Goals and CMS Choice   CMS Medicare.gov Compare Post Acute Care list provided to:: (N/A)     Discharge Plan and Services In-house Referral: NA Discharge Planning Services: CM Consult, Follow-up appt scheduled Post Acute Care Choice: NA          DME Arranged: N/A DME Agency: NA       HH Arranged: NA HH Agency: NA     Readmission Risk Interventions No flowsheet data found.

## 2018-12-11 ENCOUNTER — Inpatient Hospital Stay (HOSPITAL_COMMUNITY): Payer: 59

## 2018-12-11 DIAGNOSIS — I4891 Unspecified atrial fibrillation: Secondary | ICD-10-CM

## 2018-12-11 DIAGNOSIS — I313 Pericardial effusion (noninflammatory): Secondary | ICD-10-CM

## 2018-12-11 LAB — BASIC METABOLIC PANEL
Anion gap: 11 (ref 5–15)
BUN: 12 mg/dL (ref 6–20)
CO2: 21 mmol/L — ABNORMAL LOW (ref 22–32)
Calcium: 9.4 mg/dL (ref 8.9–10.3)
Chloride: 104 mmol/L (ref 98–111)
Creatinine, Ser: 0.88 mg/dL (ref 0.44–1.00)
GFR calc Af Amer: >60 mL/min (ref >60)
GFR calc non Af Amer: >60 mL/min (ref >60)
Glucose, Bld: 119 mg/dL — ABNORMAL HIGH (ref 70–99)
Potassium: 3.7 mmol/L (ref 3.5–5.1)
Sodium: 136 mmol/L (ref 135–145)

## 2018-12-11 LAB — PROCALCITONIN: Procalcitonin: <0.1 ng/mL

## 2018-12-11 LAB — CBC
HCT: 34.1 % — ABNORMAL LOW (ref 36.0–46.0)
Hemoglobin: 11.1 g/dL — ABNORMAL LOW (ref 12.0–15.0)
MCH: 24.2 pg — ABNORMAL LOW (ref 26.0–34.0)
MCHC: 32.6 g/dL (ref 30.0–36.0)
MCV: 74.5 fL — ABNORMAL LOW (ref 80.0–100.0)
Platelets: 396 10*3/uL (ref 150–400)
RBC: 4.58 MIL/uL (ref 3.87–5.11)
RDW: 14.3 % (ref 11.5–15.5)
WBC: 5.5 10*3/uL (ref 4.0–10.5)
nRBC: 0 % (ref 0.0–0.2)

## 2018-12-11 LAB — TSH: TSH: 2.343 u[IU]/mL (ref 0.350–4.500)

## 2018-12-11 LAB — T4, FREE: Free T4: 0.91 ng/dL (ref 0.82–1.77)

## 2018-12-11 LAB — GLUCOSE, CAPILLARY
Glucose-Capillary: 119 mg/dL — ABNORMAL HIGH (ref 70–99)
Glucose-Capillary: 157 mg/dL — ABNORMAL HIGH (ref 70–99)
Glucose-Capillary: 191 mg/dL — ABNORMAL HIGH (ref 70–99)
Glucose-Capillary: 150 mg/dL — ABNORMAL HIGH (ref 70–99)

## 2018-12-11 LAB — D-DIMER, QUANTITATIVE: D-Dimer, Quant: 0.6 ug/mL-FEU — ABNORMAL HIGH (ref 0.00–0.50)

## 2018-12-11 MED ORDER — GUAIFENESIN-DM 100-10 MG/5ML PO SYRP: 15.0000 mL | ORAL_SOLUTION | ORAL | 0 refills | Status: DC | PRN

## 2018-12-11 MED ORDER — SODIUM CHLORIDE 0.9 % WEIGHT BASED INFUSION
3.0000 mL/kg/h | INTRAVENOUS | Status: AC
  Administered 2018-12-12: 05:00:00 3 mL/kg/h via INTRAVENOUS

## 2018-12-11 MED ORDER — MAGNESIUM SULFATE 2 GM/50ML IV SOLN
2.0000 g | Freq: Once | INTRAVENOUS | Status: AC
  Administered 2018-12-11: 2 g via INTRAVENOUS
  Filled 2018-12-11: qty 50

## 2018-12-11 MED ORDER — IOHEXOL 350 MG/ML SOLN
75.0000 mL | Freq: Once | INTRAVENOUS | Status: AC | PRN
  Administered 2018-12-11: 75 mL via INTRAVENOUS

## 2018-12-11 MED ORDER — PREDNISONE 20 MG PO TABS: 20.0000 mg | ORAL_TABLET | Freq: Every day | ORAL | 0 refills | Status: DC

## 2018-12-11 MED ORDER — SODIUM CHLORIDE 0.9 % WEIGHT BASED INFUSION: 1.0000 mL/kg/h | INTRAVENOUS | Status: DC

## 2018-12-11 MED ORDER — SODIUM CHLORIDE 0.9% FLUSH: 3.0000 mL | Freq: Two times a day (BID) | INTRAVENOUS | Status: DC

## 2018-12-11 MED ORDER — SODIUM CHLORIDE 0.9% FLUSH: 3.0000 mL | INTRAVENOUS | Status: DC | PRN

## 2018-12-11 MED ORDER — SODIUM CHLORIDE 0.9 % IV SOLN
INTRAVENOUS | Status: DC | PRN
  Administered 2018-12-11: 14:00:00 20 mL via INTRAVENOUS

## 2018-12-11 MED ORDER — METOPROLOL TARTRATE 50 MG PO TABS: 50.0000 mg | ORAL_TABLET | Freq: Two times a day (BID) | ORAL | 3 refills | Status: DC

## 2018-12-11 MED ORDER — METOPROLOL TARTRATE 50 MG PO TABS
50.0000 mg | ORAL_TABLET | Freq: Two times a day (BID) | ORAL | Status: DC
  Administered 2018-12-11 – 2018-12-12 (×3): 50 mg via ORAL
  Filled 2018-12-11 (×3): qty 1

## 2018-12-11 MED ORDER — APIXABAN 5 MG PO TABS: 5.0000 mg | ORAL_TABLET | Freq: Two times a day (BID) | ORAL | Status: DC

## 2018-12-11 MED ORDER — PREDNISONE 20 MG PO TABS
20.0000 mg | ORAL_TABLET | Freq: Every day | ORAL | Status: DC
  Administered 2018-12-11 – 2018-12-12 (×2): 20 mg via ORAL
  Filled 2018-12-11 (×2): qty 1

## 2018-12-11 MED ORDER — SODIUM CHLORIDE 0.9 % IV SOLN: 250.0000 mL | INTRAVENOUS | Status: DC | PRN

## 2018-12-11 MED ORDER — ASPIRIN 81 MG PO CHEW
81.0000 mg | CHEWABLE_TABLET | ORAL | Status: AC
  Administered 2018-12-12: 05:00:00 81 mg via ORAL
  Filled 2018-12-11: qty 1

## 2018-12-11 MED FILL — ?METOPROLOL 50 MG TABLET: 50 | 30 days supply | Qty: 60 | Fill #0

## 2018-12-11 MED FILL — ?PREDNISONE 10 MG TABLET: 10 | 7 days supply | Qty: 14 | Fill #0

## 2018-12-11 NOTE — Progress Notes (Addendum)
NAME:  [name], MRN:  [id number], DOB:  Oct 14, 1975, LOS: 3 ADMISSION DATE:  12/08/2018, CONSULTATION DATE:  12/09/2018 REFERRING MD: Dr. Virgina Jock, CHIEF COMPLAINT: Shortness of breath  Brief History   Patient with a history of lupus Came in with 1  week history of worsening shortness of breath She has had  a chronic cough for about a year, usually dry, recently with clear phlegm Denies sick contacts Denies fevers or chills Lupus diagnosed in late 90s, initially treated with Plaquenil, currently on Plaquenil and Imuran History of cardiomyopathy Recently found to have a pleural effusion  History of present illness   Worsening shortness of breath Recently completed a course of steroids Was recently on steroids which she said did help her chronic cough  Past Medical History   Past Medical History:  Diagnosis Date  . Abnormal Pap smear of cervix    colpo, HPV  . Allergy   . Anemia   . Atrial fibrillation ([place])   . CHF (congestive heart failure) ([place])   . Depression    after losses  . Enlarged heart    managed by cardiology  . Fibroid   . Gestational diabetes   . Heart murmur   . Hypertension   . Infection    UTI  . Lupus ([place]) [facility] Events     Consults:  PCCM 12/09/2018  Procedures:  None  Significant Diagnostic Tests:  High resolution CT scan showing fibrosis 09/24/2018 IMPRESSION: 1. 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 No frank honeycombing. Differential includes usual interstitial pneumonia (UIP) or fibrotic phase nonspecific interstitial pneumonia (NSIP). Findings are categorized as probable UIP per consensus guidelines: Diagnosis of Idiopathic Pulmonary Fibrosis: An Official ATS/ERS/JRS/ALAT Clinical Practice Guideline. [place], Iss 5, [id number], Apr 13 2017. 2. Moderate pericardial effusion. 3.  Three-vessel coronary atherosclerosis. 4. Chronic stable bilateral axillary and mediastinal lymphadenopathy, most compatible with benign etiology.  Echocardiogram 11/20/2018: Left ventricle cavity is normal in size. Moderate concentric hypertrophy of the left ventricle. Normal global wall motion. Doppler evidence of grade II (pseudonormal) diastolic dysfunction, elevated LAP. Calculated EF 63%. Left atrial cavity is severely dilated. Mild aortic valve leaflet thickening. Moderate aortic valve stenosis. Mild (Grade I) aortic regurgitation. Aortic valve mean gradient of 17 mmHg, Vmax of 2.8 m/s. Calculated aortic valve area by continuity equation is 1.2 cm. Moderate (Grade II) mitral regurgitation. Mild tricuspid regurgitation. Estimated pulmonary artery systolic pressure 25 mmHg. Moderate pericardial effusion with only small posterior loculation that measures 3.2 cm-large. No hemodynamic compromise. No significant change compared to previous study on 08/12/2018.  Micro Data:    Antimicrobials:  None  Interim history/subjective:  Continues to do well. She is on RA with sats of 90-92% Endorses a dry cough, worse when she is lying down Fib rate control continues to be an issue  Objective   Blood pressure 128/89, pulse (!) 107, temperature 99 F (37.2 C), temperature source Oral, resp. rate 20, height 5' (1.524 m), weight 102.9 kg, SpO2 90 %.        Intake/Output Summary (Last 24 hours) at 12/11/2018 0858 Last data filed at 12/10/2018 2230 Gross per 24 hour  Intake 1940 ml  Output 725 ml  Net 1215 ml   Filed Weights   12/09/18 0549 12/10/18 0433 12/11/18 0700  Weight: 103.3 kg 103.5 kg 102.9 kg    Examination: General: Middle-age lady,  in no distress, obese, walking about in room HENT: NCAT, thick neck, Moist oral mucosa, No LAD Lungs: Bilateral chest excursion, Crackles per right base Cardiovascular: S1-S2 appreciated, A fib per tele, rate 121 Abdomen: Bowel sounds +,  soft nontender, ND, Obese Extremities: No obvious deformities.,Mild edema, no clubbing, warm to touch with brisk refill noted Neuro: Alert and oriented x3, MAE x 4, appropriate, ambulating independently in room GU:Not assessed  Resolved Hospital Problem list     Assessment & Plan:   Atrial fibrillation with rapid ventricular response -Rate  Control is goal - Appreciate cards input - symptom management - Continue to optimize treatment - Consider Checking  Mag and replete as needed  Decompensated diastolic heart failure + 2423 cc's - Trend BNP - Trend CXR prn - Marked cardiomegaly compared with previous>> Diuresis/ management  per primary team - No pulmonary hypertension on recent echo   Shortness of breath>> improved , now on  room air -Likely related to atrial fibrillation and associated possible decompensated heart failure -Improvement with better rate control and diuresis - Sat goals are 88-92%  Interstitial lung disease No baseline PFT's, DLCO -Likely secondary to connective tissue disease -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 -Fibrosing interstitial lung disease Plan: Outpatient follow up with PFT's and 6 minute walk Inflammatory markers at OP  follow up , once more stable  Consider anti-fibrotic medications based on OP labs, PFT results Goal is to optimize treatment of ILD  Dry cough Continue Tessalon and Robitussin prn  Continue Protonix Consider GERD diet Will add  prednisone 10 mg daily x 14 days   She does not appear to have an acute infectious process as a cause of exacerbation at present >> afebrile and normal WBC>> but CXR with ? Multifocal pneumonia>> PCT < 0.10. PCCM will sign off  Please do not hesitate to re-consult if we can be of any additional benefit Pt. Is scheduled for OP  Hospital Follow Up at [facility]  01/12/2019 at 10 am with Eric Form, NP She will be followed by Dr. Ander Slade as primary in the office   Best  practice:    DVT prophylaxis: On full dose Eliquis GI prophylaxis: Protonix Glucose control: SSI, CBG Mobility: As tolerated Code Status: Full code Family Communication:  Disposition: Telemetry  Labs   CBC: Recent Labs  Lab 12/08/18 1526 12/10/18 0854  WBC 6.5 5.9  NEUTROABS 3.8  --   HGB 12.1 11.2*  HCT 37.3 33.7*  MCV 75.7* 75.1*  PLT 361 536    Basic Metabolic Panel: Recent Labs  Lab 12/08/18 1526 12/08/18 1547 12/09/18 0347 12/10/18 0320 12/11/18 0335  NA 140  --  138 137 136  K 3.1*  --  3.0* 4.3 3.7  CL 101  --  102 105 104  CO2 27  --  24 19* 21*  GLUCOSE 156*  --  124* 109* 119*  BUN 7  --  6 10 12   CREATININE 0.85  --  0.74 0.82 0.88  CALCIUM 9.1  --  8.7* 9.2 9.4  MG  --  1.5* 1.8  --   --    GFR: Estimated Creatinine Clearance: 89.1 mL/min (by C-G formula based on SCr of 0.88 mg/dL). Recent Labs  Lab 12/08/18 1526 12/10/18 0854 12/11/18 0335  PROCALCITON  --   --  <0.10  WBC 6.5 5.9  --     Liver Function Tests: Recent Labs  Lab 12/08/18 1526  AST 20  ALT 11  ALKPHOS  73  BILITOT 0.8  PROT 8.1  ALBUMIN 3.5   No results for input(s): LIPASE, AMYLASE in the last 168 hours. No results for input(s): AMMONIA in the last 168 hours.  ABG    Component Value Date/Time   HCO3 26.4 03/14/2018 1625   TCO2 28 03/14/2018 1625   O2SAT 78.0 03/14/2018 1625     Coagulation Profile: No results for input(s): INR, PROTIME in the last 168 hours.  Cardiac Enzymes: Recent Labs  Lab 12/08/18 1547  CKTOTAL 143    HbA1C: Hemoglobin A1C  Date/Time Value Ref Range Status  09/16/2018 03:40 PM 6.2 (A) 4.0 - 5.6 % Final  07/02/2018 11:16 AM 9.0 (A) 4.0 - 5.6 % Final   Hgb A1c MFr Bld  Date/Time Value Ref Range Status  03/13/2018 02:18 AM 9.1 (H) 4.8 - 5.6 % Final    Comment:    (NOTE) Pre diabetes:          5.7%-6.4% Diabetes:              >6.4% Glycemic control for   <7.0% adults with diabetes   12/25/2017 02:06 AM 6.9 (H) 4.8 - 5.6 %  Final    Comment:    (NOTE) Pre diabetes:          5.7%-6.4% Diabetes:              >6.4% Glycemic control for   <7.0% adults with diabetes     CBG: Recent Labs  Lab 12/09/18 2119 12/10/18 0751 12/10/18 1235 12/10/18 2220 12/11/18 0747  GLUCAP 138* 101* 111* 116* 119*      Past Medical History  She,  has a past medical history of Abnormal Pap smear of cervix, Allergy, Anemia, Atrial fibrillation ([place]), CHF (congestive heart failure) ([place]), Depression, Enlarged heart, Fibroid, Gestational diabetes, Heart murmur, Hypertension, Infection, and Lupus ([place]) (1999).   Surgical History    Past Surgical History:  Procedure Laterality Date  . A-FLUTTER ABLATION N/A 06/10/2018   Procedure: A-FLUTTER ABLATION;  Surgeon: Evans Lance, MD;  Location: [facility];  Service: Cardiovascular;  Laterality: N/A;  . RIGHT/LEFT HEART CATH AND CORONARY ANGIOGRAPHY N/A 03/14/2018   Procedure: RIGHT/LEFT HEART CATH AND CORONARY ANGIOGRAPHY;  Surgeon: [name], MD;  Location: [facility];  Service: Cardiovascular;  Laterality: N/A;  . THERAPEUTIC ABORTION       Social History   reports that she has never smoked. She has never used smokeless tobacco. She reports current alcohol use. She reports that she does not use drugs.   Family History   Her family history includes Cancer in her maternal grandfather; Diabetes in her father and mother; Hypertension in her maternal grandmother. There is no history of Hearing loss.   Allergies Allergies  Allergen Reactions  . Carrot Oil Shortness Of Breath, Itching, Rash and Other (See Comments)    Bumps on tongue, also  . Celery Oil Shortness Of Breath, Itching, Rash and Other (See Comments)    Bumps on tongue, also  . Peanut-Containing Drug Products Anaphylaxis, Shortness Of Breath, Itching, Rash and Other (See Comments)    Bumps on tongue, also  . Septra [Bactrim] [place], AGACNP-BC [place] Pager # [phone] If no answer call [phone] 12/11/2018 8:59 AM

## 2018-12-11 NOTE — Progress Notes (Signed)
Subjective:  Heart rate remains elevated. Cough better  Objective:  Vital Signs in the last 24 hours: Temp:  [98.7 F (37.1 C)-99 F (37.2 C)] 99 F (37.2 C) (04/30 0700) Pulse Rate:  [98-118] 118 (04/30 1121) BP: (123-134)/(78-89) 134/78 (04/30 1120) SpO2:  [90 %-95 %] 90 % (04/30 0700) Weight:  [102.9 kg] 102.9 kg (04/30 0700)  Intake/Output from previous day: 04/29 0701 - 04/30 0700 In: 1940 [P.O.:1940] Out: 725 [Urine:725]  Physical Exam  Constitutional: She is oriented to person, place, and time. She appears well-developed and well-nourished. No distress.  HENT:  Head: Normocephalic and atraumatic.  Eyes: Pupils are equal, round, and reactive to light. Conjunctivae are normal.  Neck: No JVD present.  Cardiovascular: Intact distal pulses. An irregularly irregular rhythm present. Tachycardia present.  Pulmonary/Chest: Effort normal and breath sounds normal. She has no wheezes. She has no rales.  Abdominal: Soft. Bowel sounds are normal. There is no rebound.  Musculoskeletal:        General: Edema (1+ b/l) present.  Lymphadenopathy:    She has no cervical adenopathy.  Neurological: She is alert and oriented to person, place, and time. No cranial nerve deficit.  Skin: Skin is warm and dry.  Psychiatric: She has a normal mood and affect.  Nursing note and vitals reviewed.    Lab Results: BMP Recent Labs    12/09/18 0347 12/10/18 0320 12/11/18 0335  NA 138 137 136  K 3.0* 4.3 3.7  CL 102 105 104  CO2 24 19* 21*  GLUCOSE 124* 109* 119*  BUN 6 10 12   CREATININE 0.74 0.82 0.88  CALCIUM 8.7* 9.2 9.4  GFRNONAA >60 >60 >60  GFRAA >60 >60 >60    CBC Recent Labs  Lab 12/08/18 1526  12/11/18 0941  WBC 6.5   < > 5.5  RBC 4.93   < > 4.58  HGB 12.1   < > 11.1*  HCT 37.3   < > 34.1*  PLT 361   < > 396  MCV 75.7*   < > 74.5*  MCH 24.5*   < > 24.2*  MCHC 32.4   < > 32.6  RDW 14.7   < > 14.3  LYMPHSABS 1.7  --   --   MONOABS 0.8  --   --   EOSABS 0.2  --   --    BASOSABS 0.0  --   --    < > = values in this interval not displayed.    HEMOGLOBIN A1C Lab Results  Component Value Date   HGBA1C 6.2 (A) 09/16/2018   MPG 214.47 03/13/2018    Cardiac Panel (last 3 results) Recent Labs    03/13/18 0952 03/13/18 1543 03/13/18 2045 12/08/18 1547  CKTOTAL  --   --   --  143  TROPONINI 0.03* 0.03* 0.03*  --     BNP (last 3 results) Recent Labs    03/13/18 0218 05/12/18 1002 12/08/18 1526  BNP 229.2* 302.7* 351.5*    TSH Recent Labs    03/13/18 0218 05/12/18 2022 12/11/18 0941  TSH 1.041 0.817 2.343    Lipid Panel     Component Value Date/Time   CHOL  08/18/2008 1746    179        ATP III CLASSIFICATION:  <200     mg/dL   Desirable  200-239  mg/dL   Borderline High  >=240    mg/dL   High          TRIG 100  08/18/2008 1746   HDL 37 (L) 08/18/2008 1746   CHOLHDL 4.8 08/18/2008 1746   VLDL 20 08/18/2008 1746   LDLCALC (H) 08/18/2008 1746    122        Total Cholesterol/HDL:CHD Risk Coronary Heart Disease Risk Table                     Men   Women  1/2 Average Risk   3.4   3.3  Average Risk       5.0   4.4  2 X Average Risk   9.6   7.1  3 X Average Risk  23.4   11.0        Use the calculated Patient Ratio above and the CHD Risk Table to determine the patient's CHD Risk.        ATP III CLASSIFICATION (LDL):  <100     mg/dL   Optimal  100-129  mg/dL   Near or Above                    Optimal  130-159  mg/dL   Borderline  160-189  mg/dL   High  >190     mg/dL   Very High     Hepatic Function Panel Recent Labs    03/13/18 0312 08/25/18 1611 12/08/18 1526  PROT 8.2* 8.5* 8.1  ALBUMIN 3.5 3.9 3.5  AST 18 19 20   ALT 13 10 11   ALKPHOS 59 45 73  BILITOT 0.6 0.6 0.8   CARDIAC STUDIES:  EKG 12/08/2018: Afib w/RVR  Echocardiogram 11/20/2018: Left ventricle cavity is normal in size. Moderate concentric hypertrophy of the left ventricle. Normal global wall motion. Doppler evidence of grade II  (pseudonormal) diastolic dysfunction, elevated LAP. Calculated EF 63%. Left atrial cavity is severely dilated. Mild aortic valve leaflet thickening. Moderate aortic valve stenosis. Mild (Grade I) aortic regurgitation. Aortic valve mean gradient of 17 mmHg, Vmax of 2.8 m/s. Calculated aortic valve area by continuity equation is 1.2 cm. Moderate (Grade II) mitral regurgitation. Mild tricuspid regurgitation. Estimated pulmonary artery systolic pressure 25 mmHg. Moderate pericardial effusion with only small posterior loculation that measures 3.2 cm-large. No hemodynamic compromise. No significant change compared to previous study on 08/12/2018.  CT Chest 09/24/2018: 1. 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 No frank honeycombing. Differential includes usual interstitial pneumonia (UIP) or fibrotic phase nonspecific interstitial pneumonia (NSIP). Findings are categorized as probable UIP per consensus guidelines: Diagnosis of Idiopathic Pulmonary Fibrosis: An Official ATS/ERS/JRS/ALAT Clinical Practice Guideline. [place], Iss 5, [id number], Apr 13 2017. 2. Moderate pericardial effusion. 3. Three-vessel coronary atherosclerosis. 4. Chronic stable bilateral axillary and mediastinal lymphadenopathy, most compatible with benign etiology.  Echocardiogram 08/12/2018: 1. Left ventricle cavity is normal in size. Moderate concentric hypertrophy of the left ventricle. Normal global wall motion. Doppler evidence of grade II (pseudonormal) diastolic dysfunction. Calculated EF 56%. 2. Left atrial cavity is severely dilated. 3. Mild (Grade I) aortic regurgitation. Mild aortic valve leaflet calcification. Moderately restricted aortic valve leaflets. Mild to Moderate aortic valve stenosis with peak gradient 45.6, mean gradient 23.7 mm of Hg . Calculated valve area is 1.26  cm2. 4. Trace mitral regurgitation. Mild calcification of the mitral valve annulus. 5. Trace tricuspid regurgitation. 6. Moderate pericardial effusion (more marked in post. and lateral part) with clear fluids. No signs of tamponade. 7. c.f. echo. of 01/02/2018, aortic stenosis  appears moderate (it was mild before), mild AR is new, no other diagnostic change.  R + LHC 03/14/2018: Prox LAD 50% stenosis Bordelrine pulmonary hypertension No tampoande phsyiology  Cardiac MRI 03/13/2018: 1. Normal LV size and function EF 65% moderate LVH 2. Moderate to Large circumferential pericardial effusion biggest in the posterior and lateral dimensions with no diastolic RV collapse 3. Normal appearing pericardium 4. Mild subepicardial gadolinium uptake in the basal and mid inferior wall and basal antero septum consistent with mild myocarditis 5. Tri leaflet AV with some restricted motion suggest echo correlation for degree of AS 6. Severe LAE  Assessment & Recommendations:  43 year old female with long-standing SLE, hypertension, type 2 diabetes mellitus, nonobstructive CAD, moderate aortic stenosis, recurrent pericardial effusion without tampoande physiology (cath 03/2018), recurrent atrial flutter, now status post ablation 05/2018, now admitted with tachycardia and worsening shortness of breath  Tachycardia: Afib w/RVR on admission. Prior flutter ablation in 05/2018. Now in sinus tachycardia with frequent PAC's Currently on metoprolol 50 mg bid and diltiazem 240 mg daily. CHA2DS2VASc score 3. Annual stroke risk 3% Resumed eliquis 5 mg bid. Given Afib, she will now need to indefinitely on anticoagulation. This will be held today and tomorrow morning for right and left heart cath. TSH, T4, procalcitonin all normal. I do not think she has infectious pneumonia. D-dimer elevated but CTA does not show PE.  Shortness of breath Improved after diuresis. Still has interstitial lung disease  findings. Appreciate pulmonology consult. Needs outpatient follow up Continue lasix 40 mg PO bid with additional dose of IV lasix today.  Pericardial effusion: Related to lupus. Clinically not in tamponade Resume colchicine 0.6 mg bid Although there is no increase pericardial thickness on CT today and MRI in 03/2019, given her interventricular septal bounce, exaggerated respiratory variation in mitral and tricuspid inflow, and reflux of contrast seen in IVC and hepatic veins on CT scan, I am concerned that she may have pericardial constriction from longstanding pericardial effusion. Of course, she has a lot of confounding factors that may be contributing to her symptoms. She certainly needs ongoing management for her lupus and interstitial lung disease. However, pericardial constriction diagnosis and management could be critical to her improve her symptoms. I have discussed with Dr. Prescott Gum. Appreciate his help. I will obtain right and left heart cath tomorrow.   CAD: Continue Aspirin, statin May consider FFR to LAD during right and left heart cath on 12/12/2018  Moderate AS: Stable  SLE: Continue baseline plaquenil., Imuran. Discussed with her rheumatologist Dr. Pearline Cables. Will discharge her on prednisone 20 mg daily for 7 days.   DM: Continue baseline insulin. SSI  Anticipated discharge 04/29  [name], M.D. 12/11/2018, 4:22 PM [facility] Pager: [phone] Office: [phone] If no answer: [phone]

## 2018-12-11 NOTE — Consult Note (Signed)
[address]       [place] [address]             [phone]        Flora L [place] Medical Record #[id number] Date of Birth: February 24, 1976  Referring: No ref. provider found Primary Care: Gildardo Pounds, NP Primary Cardiologist:No primary care provider on file.  Chief Complaint:    Chief Complaint  Patient presents with   Shortness of Breath  Asked to evaluate patient for pericardial disease, chronic pericardial effusion and history of lupus. Patient examined, most recent echocardiogram images going back to mid 2019 and chest CT scan and cardiac MRI images going back to mid 2019 personally reviewed.  Patient discussed with Dr. Virgina Jock for coordination of care.  History of Present Illness:     43 year old obese diabetic patient admitted late April after 2 weeks of increasing shortness of breath and fatigue.  She is found to be in rapid atrial fibrillation.  She has been recently diagnosed with interstitial lung disease, probable UIP.  She has history of known moderate loculated posterior pericardial effusion since August 2019.  She has known 50% mid LAD stenosis and mild aortic stenosis with normal LV function.  She underwent right heart catheterization August 2019 because of the loculated pericardial effusion which showed normal right atrial pressure, no evidence of tamponade.  On this admission the echocardiogram was repeated showing no change in the posterior loculated pericardial effusion with good ventricular function.  The left atrial chamber is chronically severely enlarged.  A rapid atrial fibrillation was treated medically and she has been started on Eliquis. Repeat CT scan of the chest showed probable progression of her interstitial lung disease she was recently seen in the hospital by pulmonary-critical care and started on prednisone Which has improved her symptoms of cough in the past.  Previous cardiac MRI August 2019 showed no evidence  of pericardial thickening-constrictive pericarditis.  In September 2019 the patient underwent right atrial ablation for atrial flutter and was placed on anticoagulation short-term.  This could have contributed to her pericardial effusion issues.  Currently in a controlled rhythm after diuresis and on low-dose prednisone she states she feels great and her breathing is unimpaired.  Current Activity/ Functional Status: Patient is able to do self-care and lives independently   Zubrod Score: At the time of surgery this patients most appropriate activity status/level should be described as: []     0    Normal activity, no symptoms []     1    Restricted in physical strenuous activity but ambulatory, able to do out light work [x]     2    Ambulatory and capable of self care, unable to do work activities, up and about                 more than 50%  Of the time                            []     3    Only limited self care, in bed greater than 50% of waking hours []     4    Completely disabled, no self care, confined to bed or chair []     5    Moribund  Past Medical History:  Diagnosis Date   Abnormal Pap smear of cervix    colpo, HPV   Allergy    Anemia  Atrial fibrillation (HCC)    CHF (congestive heart failure) ([place])    Depression    after losses   Enlarged heart    managed by cardiology   Fibroid    Gestational diabetes    Heart murmur    Hypertension    Infection    UTI   Lupus ([place]) 1999    Past Surgical History:  Procedure Laterality Date   A-FLUTTER ABLATION N/A 06/10/2018   Procedure: A-FLUTTER ABLATION;  Surgeon: Evans Lance, MD;  Location: [facility];  Service: Cardiovascular;  Laterality: N/A;   RIGHT/LEFT HEART CATH AND CORONARY ANGIOGRAPHY N/A 03/14/2018   Procedure: RIGHT/LEFT HEART CATH AND CORONARY ANGIOGRAPHY;  Surgeon: [name], MD;  Location: [facility];  Service: Cardiovascular;  Laterality: N/A;   THERAPEUTIC  ABORTION      Social History   Tobacco Use  Smoking Status Never Smoker  Smokeless Tobacco Never Used    Social History   Substance and Sexual Activity  Alcohol Use Yes   Comment: ocasionally      Allergies  Allergen Reactions   Carrot Oil Shortness Of Breath, Itching, Rash and Other (See Comments)    Bumps on tongue, also   Celery Oil Shortness Of Breath, Itching, Rash and Other (See Comments)    Bumps on tongue, also   Peanut-Containing Drug Products Anaphylaxis, Shortness Of Breath, Itching, Rash and Other (See Comments)    Bumps on tongue, also   Septra [Bactrim] Hives    Current Facility-Administered Medications  Medication Dose Route Frequency Provider Last Rate Last Dose   0.9 %  sodium chloride infusion   Intravenous PRN Patwardhan, Manish J, MD 10 mL/hr at 12/11/18 1359 20 mL at 12/11/18 1359   acetaminophen (TYLENOL) tablet 650 mg  650 mg Oral Q4H PRN [name], MD   650 mg at 12/08/18 2036   albuterol (PROVENTIL) (2.5 MG/3ML) 0.083% nebulizer solution 2.5 mg  2.5 mg Inhalation Q6H PRN Patwardhan, Reynold Bowen, MD       [START ON 12/12/2018] apixaban (ELIQUIS) tablet 5 mg  5 mg Oral BID Patwardhan, Manish J, MD       azaTHIOprine (IMURAN) tablet 50 mg  50 mg Oral BID Patwardhan, Manish J, MD   50 mg at 12/11/18 1120   benzonatate (TESSALON) capsule 200 mg  200 mg Oral TID PRN Patwardhan, Manish J, MD   200 mg at 12/09/18 1502   colchicine tablet 0.6 mg  0.6 mg Oral BID Patwardhan, Manish J, MD   0.6 mg at 12/11/18 1119   diltiazem (CARDIZEM CD) 24 hr capsule 240 mg  240 mg Oral Daily Patwardhan, Manish J, MD   240 mg at 12/11/18 1120   furosemide (LASIX) tablet 40 mg  40 mg Oral BID Patwardhan, Manish J, MD   40 mg at 12/11/18 0931   guaiFENesin-dextromethorphan (ROBITUSSIN DM) 100-10 MG/5ML syrup 15 mL  15 mL Oral Q4H PRN Theora Gianotti, RN   15 mL at 12/09/18 2043   hydroxychloroquine (PLAQUENIL) tablet 200 mg  200 mg Oral BID Patwardhan,  Manish J, MD   200 mg at 12/11/18 1120   insulin aspart (novoLOG) injection 0-15 Units  0-15 Units Subcutaneous TID WC Patwardhan, Manish J, MD   3 Units at 12/11/18 1248   insulin aspart (novoLOG) injection 6 Units  6 Units Subcutaneous TID WC Patwardhan, Manish J, MD   6 Units at 12/11/18 1248   insulin glargine (LANTUS) injection 16 Units  16 Units  Subcutaneous QAC breakfast Patwardhan, Reynold Bowen, MD   16 Units at 12/11/18 7001   menthol-cetylpyridinium (CEPACOL) lozenge 3 mg  1 lozenge Oral PRN Meade Maw, MD       metoprolol tartrate (LOPRESSOR) tablet 50 mg  50 mg Oral BID Patwardhan, Manish J, MD   50 mg at 12/11/18 1121   ondansetron (ZOFRAN) injection 4 mg  4 mg Intravenous Q6H PRN Patwardhan, Manish J, MD       pantoprazole (PROTONIX) EC tablet 40 mg  40 mg Oral Daily Patwardhan, Manish J, MD   40 mg at 12/11/18 1121   predniSONE (DELTASONE) tablet 20 mg  20 mg Oral Q breakfast Patwardhan, Manish J, MD   20 mg at 12/11/18 0931   rosuvastatin (CRESTOR) tablet 20 mg  20 mg Oral q1800 Patwardhan, Manish J, MD   20 mg at 12/10/18 1806    Medications Prior to Admission  Medication Sig Dispense Refill Last Dose   acetaminophen (TYLENOL) 500 MG tablet Take 1,000 mg by mouth every 6 (six) hours as needed for headache (pain).   couple days ago   albuterol (PROVENTIL HFA;VENTOLIN HFA) 108 (90 Base) MCG/ACT inhaler Inhale 2 puffs into the lungs every 6 (six) hours as needed for wheezing or shortness of breath. 1 Inhaler 2 12/08/2018 at am   amLODipine (NORVASC) 10 MG tablet Take 10 mg by mouth daily.   12/08/2018 at am   aspirin EC 81 MG tablet Take 81 mg by mouth daily.   12/07/2018 at am   azaTHIOprine (IMURAN) 50 MG tablet Take 50 mg by mouth 2 (two) times daily.   12/07/2018 at pm   benzonatate (TESSALON) 200 MG capsule Take 1 capsule (200 mg total) by mouth 3 (three) times daily as needed for cough. 30 capsule 0 week ago   cetirizine (ZYRTEC) 10 MG tablet Take 1 tablet (10  mg total) by mouth daily. 90 tablet 1 12/07/2018 at am   furosemide (LASIX) 40 MG tablet TAKE 1 TABLET (40 MG TOTAL) BY MOUTH 2 (TWO) TIMES DAILY. 60 tablet 2 12/08/2018 at am   hydroxychloroquine (PLAQUENIL) 200 MG tablet Take 1 tablet (200 mg total) by mouth 2 (two) times daily. 60 tablet 4 12/08/2018 at am   insulin glargine (LANTUS) 100 UNIT/ML injection Inject 0.08 mLs (8 Units total) into the skin daily before breakfast. (Patient taking differently: Inject 16 Units into the skin daily before breakfast. ) 10 mL 3 12/08/2018 at am   medroxyPROGESTERone (DEPO-PROVERA) 150 MG/ML injection Inject 150 mg into the muscle every 3 (three) months.   february   metFORMIN (GLUCOPHAGE) 1000 MG tablet Take 1 tablet (1,000 mg total) by mouth 2 (two) times daily with a meal. 60 tablet 3 12/07/2018 at pm   metoprolol tartrate (LOPRESSOR) 25 MG tablet Take 1 tablet (25 mg total) by mouth 2 (two) times daily. 60 tablet 4 12/08/2018 at 900   omeprazole (PRILOSEC) 20 MG capsule Take 1 capsule (20 mg total) by mouth daily as needed (for heartburn or GERD-like symptoms). (Patient taking differently: Take 20 mg by mouth daily. ) 30 capsule 3 couple days ago   Phenyleph-CPM-DM-Aspirin (ALKA-SELTZER PLUS COLD & COUGH PO) Take 2 tablets by mouth every 4 (four) hours as needed (cough/congestion).   12/07/2018 at pm   potassium chloride (K-DUR) 10 MEQ tablet Take 1 tablet (10 mEq total) by mouth daily. 30 tablet 1 week ago   rosuvastatin (CRESTOR) 20 MG tablet Take 1 tablet (20 mg total) by mouth daily at  6 PM. 30 tablet 4 12/07/2018 at pm   [DISCONTINUED] colchicine 0.6 MG tablet Take 1 tablet (0.6 mg total) by mouth daily. (Patient taking differently: Take 0.6 mg by mouth every evening. ) 60 tablet 2 12/07/2018 at pm   acetaminophen (TYLENOL) 325 MG tablet Take 2 tablets (650 mg total) by mouth every 4 (four) hours as needed for headache or mild pain. (Patient not taking: Reported on 12/08/2018)   Not Taking at Unknown  time   amLODipine (NORVASC) 5 MG tablet Take 1 tablet (5 mg total) by mouth daily. (Patient not taking: Reported on 12/08/2018) 30 tablet 4 Not Taking at Unknown time   Blood Glucose Monitoring Suppl (TRUE METRIX METER) w/Device KIT Check blood sugars fasting and at bedtime 1 kit 0 Taking   glucose blood (TRUE METRIX BLOOD GLUCOSE TEST) test strip Use as instructed 100 each 12 Taking   TRUEPLUS LANCETS 28G MISC Check blood sugars as directed 100 each 1 Not Taking    Family History  Problem Relation Age of Onset   Cancer Maternal Grandfather        lung   Diabetes Mother    Hypertension Maternal Grandmother    Diabetes Father    Hearing loss Neg Hx      Review of Systems:   ROS      Cardiac Review of Systems: Y or  [    ]= no  Chest Pain [    ]  Resting SOB [   ] Exertional SOB  Blue.[email or web address]  ]  Orthopnea [  ]   Pedal Edema [   ]    Palpitations Blue.Reese  ] Syncope  [  ]   Presyncope [   ]  General Review of Systems: [Y] = yes [  ]=no Constitional: recent weight change Blue.Reese  ]; anorexia [  ]; fatigue [  ]; nausea [  ]; night sweats [  ]; fever [  ]; or chills [  ]                                                               Dental: Last Dentist visit: 1 year  Eye : blurred vision [  ]; diplopia [   ]; vision changes [  ];  Amaurosis fugax[  ]; Resp: cough [  ];  wheezing[  ];  hemoptysis[  ]; shortness of breathy[  ]; paroxysmal nocturnal dyspnea[  ]; dyspnea on exertion[  ]; or orthopnea[  ];  GI:  gallstones[  ], vomiting[  ];  dysphagia[  ]; melena[  ];  hematochezia [  ]; heartburn[  ];   Hx of  Colonoscopy[  ]; GU: kidney stones [  ]; hematuria[  ];   dysuria [  ];  nocturia[  ];  history of     obstruction [  ]; urinary frequency Blue.Reese  ]             Skin: rash, swelling[  ];, hair loss[  ];  peripheral edema[  ];  or itching[  ]; Musculosketetal: myalgias[y  ];  joint swelling[  ];  joint erythema[  ];  joint pain[  ];  back pain[  ];  Heme/Lymph: bruising[  ];  bleeding[  ];  anemia[   ];  Neuro:  TIA[  ];  headaches[  ];  stroke[  ];  vertigo[  ];  seizures[  ];   paresthesias[  ];  difficulty walking[  ];  Psych:depression[  ]; anxiety[  ];  Endocrine: diabetes[y  ];  thyroid dysfunction[  ];             No history of thoracic trauma No history of spontaneous pneumothorax No smoking history She has been recommended to be tested for sleep apnea by her primary care physicians.    Physical Exam: BP 134/78    Pulse (!) 118    Temp 99 F (37.2 C) (Oral)    Resp 20    Ht 5' (1.524 m)    Wt 102.9 kg    SpO2 90%    BMI 44.31 kg/m         Exam    General- alert and comfortable.  Short obese AA female no acute distress on room air breathing comfortably    Neck- no JVD, no cervical adenopathy palpable, no carotid bruit   Lungs- clear without rales, wheezes   Cor-irregular rate and rhythm, no murmur , gallop   Abdomen- soft, non-tender   Extremities - warm, non-tender, minimal edema   Neuro- oriented, appropriate, no focal weakness   Diagnostic Studies & Laboratory data:     Recent Radiology Findings:   Dg Chest 2 View  Result Date: 12/10/2018 CLINICAL DATA:  Shortness of breath and tachycardia EXAM: CHEST - 2 VIEW COMPARISON:  December 09, 2018 and December 08, 2018 FINDINGS: There remains multifocal airspace opacity throughout the right lung, essentially stable compared to 1 day prior but representing a definite change from 2 days prior. Left lung is clear. There is cardiomegaly with pulmonary vascularity normal. No adenopathy. No bone lesions. IMPRESSION: Multifocal airspace opacity throughout the right lung, felt to represent multifocal pneumonia. Left lung clear. Stable cardiomegaly. No evident adenopathy. Electronically Signed   By: Lowella Grip III M.D.   On: 12/10/2018 12:07   Ct Angio Chest Pe W Or Wo Contrast  Result Date: 12/11/2018 CLINICAL DATA:  Shortness of breath and tachycardia EXAM: CT ANGIOGRAPHY CHEST WITH CONTRAST TECHNIQUE: Multidetector CT imaging  of the chest was performed using the standard protocol during bolus administration of intravenous contrast. Multiplanar CT image reconstructions and MIPs were obtained to evaluate the vascular anatomy. CONTRAST:  51m OMNIPAQUE IOHEXOL 350 MG/ML SOLN COMPARISON:  Chest CT September 24, 2018 and chest radiograph December 10, 2018 FINDINGS: Cardiovascular: There is no demonstrable pulmonary embolus. There is no appreciable thoracic aortic aneurysm or dissection. There are foci of aortic atherosclerosis. Visualized great vessels appear normal. There is a sizable pericardial effusion. The pericardium does not appear thickened. There is prominence of the main pulmonary outflow tract measuring 3.3 cm. Mediastinum/Nodes: Visualized thyroid appears normal. There are several scattered subcentimeter mediastinal lymph nodes. There is no adenopathy by size criteria. No esophageal lesions are evident. Lungs/Pleura: There is patchy airspace opacity in both upper lobes, more on the right than on the left. Similar opacity is noted in the superior segment right lower lobe. There is bibasilar atelectasis. There are no appreciable pleural effusions. There is slight interstitial pulmonary edema. Upper Abdomen: There is reflux of contrast into the inferior vena cava and slight reflux into a patent veins. Visualized upper abdominal structures otherwise appear normal. Musculoskeletal: No blastic or lytic bone lesions. No chest wall lesions are evident. Review of the MIP images confirms the above findings. IMPRESSION: 1. No demonstrable pulmonary embolus. There  is aortic atherosclerosis. No thoracic aortic aneurysm or dissection. 2.  Sizable pericardial effusion. 3. Prominence of the main pulmonary outflow tract, a finding likely indicative of pulmonary arterial hypertension. , 4. Slight interstitial edema. Multifocal airspace opacity, more notable on the right than on the left. The appearance is more suggestive of multifocal pneumonia and  alveolar edema. A degree of pneumonia and alveolar edema superimposed may well be present. 5. Reflux of contrast into the inferior vena cava and hepatic veins may be indicative of a degree of increase in right heart pressure. Aortic Atherosclerosis (ICD10-I70.0). Electronically Signed   By: Lowella Grip III M.D.   On: 12/11/2018 13:24     I have independently reviewed the above radiologic studies and discussed with the patient   Recent Lab Findings: Lab Results  Component Value Date   WBC 5.5 12/11/2018   HGB 11.1 (L) 12/11/2018   HCT 34.1 (L) 12/11/2018   PLT 396 12/11/2018   GLUCOSE 119 (H) 12/11/2018   CHOL  08/18/2008    179        ATP III CLASSIFICATION:  <200     mg/dL   Desirable  200-239  mg/dL   Borderline High  >=240    mg/dL   High          TRIG 100 08/18/2008   HDL 37 (L) 08/18/2008   LDLCALC (H) 08/18/2008    122        Total Cholesterol/HDL:CHD Risk Coronary Heart Disease Risk Table                     Men   Women  1/2 Average Risk   3.4   3.3  Average Risk       5.0   4.4  2 X Average Risk   9.6   7.1  3 X Average Risk  23.4   11.0        Use the calculated Patient Ratio above and the CHD Risk Table to determine the patient's CHD Risk.        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  Will follow.  Agree with plans to repeat heart cath to provide further evidence patient does not have tamponade physiology.     @ME1 @ 12/11/2018 4:40 PM

## 2018-12-11 NOTE — H&P (View-Only) (Signed)
Subjective:  Heart rate remains elevated. Cough better  Objective:  Vital Signs in the last 24 hours: Temp:  [98.7 F (37.1 C)-99 F (37.2 C)] 99 F (37.2 C) (04/30 0700) Pulse Rate:  [98-118] 118 (04/30 1121) BP: (123-134)/(78-89) 134/78 (04/30 1120) SpO2:  [90 %-95 %] 90 % (04/30 0700) Weight:  [102.9 kg] 102.9 kg (04/30 0700)  Intake/Output from previous day: 04/29 0701 - 04/30 0700 In: 1940 [P.O.:1940] Out: 725 [Urine:725]  Physical Exam  Constitutional: She is oriented to person, place, and time. She appears well-developed and well-nourished. No distress.  HENT:  Head: Normocephalic and atraumatic.  Eyes: Pupils are equal, round, and reactive to light. Conjunctivae are normal.  Neck: No JVD present.  Cardiovascular: Intact distal pulses. An irregularly irregular rhythm present. Tachycardia present.  Pulmonary/Chest: Effort normal and breath sounds normal. She has no wheezes. She has no rales.  Abdominal: Soft. Bowel sounds are normal. There is no rebound.  Musculoskeletal:        General: Edema (1+ b/l) present.  Lymphadenopathy:    She has no cervical adenopathy.  Neurological: She is alert and oriented to person, place, and time. No cranial nerve deficit.  Skin: Skin is warm and dry.  Psychiatric: She has a normal mood and affect.  Nursing note and vitals reviewed.    Lab Results: BMP Recent Labs    12/09/18 0347 12/10/18 0320 12/11/18 0335  NA 138 137 136  K 3.0* 4.3 3.7  CL 102 105 104  CO2 24 19* 21*  GLUCOSE 124* 109* 119*  BUN 6 10 12   CREATININE 0.74 0.82 0.88  CALCIUM 8.7* 9.2 9.4  GFRNONAA >60 >60 >60  GFRAA >60 >60 >60    CBC Recent Labs  Lab 12/08/18 1526  12/11/18 0941  WBC 6.5   < > 5.5  RBC 4.93   < > 4.58  HGB 12.1   < > 11.1*  HCT 37.3   < > 34.1*  PLT 361   < > 396  MCV 75.7*   < > 74.5*  MCH 24.5*   < > 24.2*  MCHC 32.4   < > 32.6  RDW 14.7   < > 14.3  LYMPHSABS 1.7  --   --   MONOABS 0.8  --   --   EOSABS 0.2  --   --    BASOSABS 0.0  --   --    < > = values in this interval not displayed.    HEMOGLOBIN A1C Lab Results  Component Value Date   HGBA1C 6.2 (A) 09/16/2018   MPG 214.47 03/13/2018    Cardiac Panel (last 3 results) Recent Labs    03/13/18 0952 03/13/18 1543 03/13/18 2045 12/08/18 1547  CKTOTAL  --   --   --  143  TROPONINI 0.03* 0.03* 0.03*  --     BNP (last 3 results) Recent Labs    03/13/18 0218 05/12/18 1002 12/08/18 1526  BNP 229.2* 302.7* 351.5*    TSH Recent Labs    03/13/18 0218 05/12/18 2022 12/11/18 0941  TSH 1.041 0.817 2.343    Lipid Panel     Component Value Date/Time   CHOL  08/18/2008 1746    179        ATP III CLASSIFICATION:  <200     mg/dL   Desirable  200-239  mg/dL   Borderline High  >=240    mg/dL   High          TRIG 100  08/18/2008 1746   HDL 37 (L) 08/18/2008 1746   CHOLHDL 4.8 08/18/2008 1746   VLDL 20 08/18/2008 1746   LDLCALC (H) 08/18/2008 1746    122        Total Cholesterol/HDL:CHD Risk Coronary Heart Disease Risk Table                     Men   Women  1/2 Average Risk   3.4   3.3  Average Risk       5.0   4.4  2 X Average Risk   9.6   7.1  3 X Average Risk  23.4   11.0        Use the calculated Patient Ratio above and the CHD Risk Table to determine the patient's CHD Risk.        ATP III CLASSIFICATION (LDL):  <100     mg/dL   Optimal  100-129  mg/dL   Near or Above                    Optimal  130-159  mg/dL   Borderline  160-189  mg/dL   High  >190     mg/dL   Very High     Hepatic Function Panel Recent Labs    03/13/18 0312 08/25/18 1611 12/08/18 1526  PROT 8.2* 8.5* 8.1  ALBUMIN 3.5 3.9 3.5  AST 18 19 20   ALT 13 10 11   ALKPHOS 59 45 73  BILITOT 0.6 0.6 0.8   CARDIAC STUDIES:  EKG 12/08/2018: Afib w/RVR  Echocardiogram 11/20/2018: Left ventricle cavity is normal in size. Moderate concentric hypertrophy of the left ventricle. Normal global wall motion. Doppler evidence of grade II  (pseudonormal) diastolic dysfunction, elevated LAP. Calculated EF 63%. Left atrial cavity is severely dilated. Mild aortic valve leaflet thickening. Moderate aortic valve stenosis. Mild (Grade I) aortic regurgitation. Aortic valve mean gradient of 17 mmHg, Vmax of 2.8 m/s. Calculated aortic valve area by continuity equation is 1.2 cm. Moderate (Grade II) mitral regurgitation. Mild tricuspid regurgitation. Estimated pulmonary artery systolic pressure 25 mmHg. Moderate pericardial effusion with only small posterior loculation that measures 3.2 cm-large. No hemodynamic compromise. No significant change compared to previous study on 08/12/2018.  CT Chest 09/24/2018: 1. 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 No frank honeycombing. Differential includes usual interstitial pneumonia (UIP) or fibrotic phase nonspecific interstitial pneumonia (NSIP). Findings are categorized as probable UIP per consensus guidelines: Diagnosis of Idiopathic Pulmonary Fibrosis: An Official ATS/ERS/JRS/ALAT Clinical Practice Guideline. [place], Iss 5, [phone], Apr 13 2017. 2. Moderate pericardial effusion. 3. Three-vessel coronary atherosclerosis. 4. Chronic stable bilateral axillary and mediastinal lymphadenopathy, most compatible with benign etiology.  Echocardiogram 08/12/2018: 1. Left ventricle cavity is normal in size. Moderate concentric hypertrophy of the left ventricle. Normal global wall motion. Doppler evidence of grade II (pseudonormal) diastolic dysfunction. Calculated EF 56%. 2. Left atrial cavity is severely dilated. 3. Mild (Grade I) aortic regurgitation. Mild aortic valve leaflet calcification. Moderately restricted aortic valve leaflets. Mild to Moderate aortic valve stenosis with peak gradient 45.6, mean gradient 23.7 mm of Hg . Calculated valve area is 1.26  cm2. 4. Trace mitral regurgitation. Mild calcification of the mitral valve annulus. 5. Trace tricuspid regurgitation. 6. Moderate pericardial effusion (more marked in post. and lateral part) with clear fluids. No signs of tamponade. 7. c.f. echo. of 01/02/2018, aortic stenosis  appears moderate (it was mild before), mild AR is new, no other diagnostic change.  R + LHC 03/14/2018: Prox LAD 50% stenosis Bordelrine pulmonary hypertension No tampoande phsyiology  Cardiac MRI 03/13/2018: 1. Normal LV size and function EF 65% moderate LVH 2. Moderate to Large circumferential pericardial effusion biggest in the posterior and lateral dimensions with no diastolic RV collapse 3. Normal appearing pericardium 4. Mild subepicardial gadolinium uptake in the basal and mid inferior wall and basal antero septum consistent with mild myocarditis 5. Tri leaflet AV with some restricted motion suggest echo correlation for degree of AS 6. Severe LAE  Assessment & Recommendations:  43 year old female with long-standing SLE, hypertension, type 2 diabetes mellitus, nonobstructive CAD, moderate aortic stenosis, recurrent pericardial effusion without tampoande physiology (cath 03/2018), recurrent atrial flutter, now status post ablation 05/2018, now admitted with tachycardia and worsening shortness of breath  Tachycardia: Afib w/RVR on admission. Prior flutter ablation in 05/2018. Now in sinus tachycardia with frequent PAC's Currently on metoprolol 50 mg bid and diltiazem 240 mg daily. CHA2DS2VASc score 3. Annual stroke risk 3% Resumed eliquis 5 mg bid. Given Afib, she will now need to indefinitely on anticoagulation. This will be held today and tomorrow morning for right and left heart cath. TSH, T4, procalcitonin all normal. I do not think she has infectious pneumonia. D-dimer elevated but CTA does not show PE.  Shortness of breath Improved after diuresis. Still has interstitial lung disease  findings. Appreciate pulmonology consult. Needs outpatient follow up Continue lasix 40 mg PO bid with additional dose of IV lasix today.  Pericardial effusion: Related to lupus. Clinically not in tamponade Resume colchicine 0.6 mg bid Although there is no increase pericardial thickness on CT today and MRI in 03/2019, given her interventricular septal bounce, exaggerated respiratory variation in mitral and tricuspid inflow, and reflux of contrast seen in IVC and hepatic veins on CT scan, I am concerned that she may have pericardial constriction from longstanding pericardial effusion. Of course, she has a lot of confounding factors that may be contributing to her symptoms. She certainly needs ongoing management for her lupus and interstitial lung disease. However, pericardial constriction diagnosis and management could be critical to her improve her symptoms. I have discussed with Dr. Prescott Gum. Appreciate his help. I will obtain right and left heart cath tomorrow.   CAD: Continue Aspirin, statin May consider FFR to LAD during right and left heart cath on 12/12/2018  Moderate AS: Stable  SLE: Continue baseline plaquenil., Imuran. Discussed with her rheumatologist Dr. Pearline Cables. Will discharge her on prednisone 20 mg daily for 7 days.   DM: Continue baseline insulin. SSI  Anticipated discharge 04/29  [name], M.D. 12/11/2018, 4:22 PM [facility] Pager: [phone] Office: [phone] If no answer: [phone]

## 2018-12-12 ENCOUNTER — Other Ambulatory Visit: Payer: Self-pay

## 2018-12-12 ENCOUNTER — Encounter (HOSPITAL_COMMUNITY)
Admission: EM | Disposition: A | Discharge status: Home / Self Care | Payer: Self-pay | Source: Home / Self Care | Attending: Cardiology

## 2018-12-12 ENCOUNTER — Encounter (HOSPITAL_COMMUNITY): Payer: Self-pay | Admitting: Cardiology

## 2018-12-12 DIAGNOSIS — I2510 Atherosclerotic heart disease of native coronary artery without angina pectoris: Secondary | ICD-10-CM | POA: Diagnosis present

## 2018-12-12 DIAGNOSIS — R0602 Shortness of breath: Secondary | ICD-10-CM

## 2018-12-12 DIAGNOSIS — I313 Pericardial effusion (noninflammatory): Secondary | ICD-10-CM

## 2018-12-12 DIAGNOSIS — I25118 Atherosclerotic heart disease of native coronary artery with other forms of angina pectoris: Secondary | ICD-10-CM

## 2018-12-12 HISTORY — PX: RIGHT/LEFT HEART CATH AND CORONARY ANGIOGRAPHY: CATH118266

## 2018-12-12 HISTORY — PX: CORONARY STENT INTERVENTION: CATH118234

## 2018-12-12 LAB — POCT I-STAT 7, (LYTES, BLD GAS, ICA,H+H)
Acid-base deficit: 3 mmol/L — ABNORMAL HIGH (ref 0.0–2.0)
Bicarbonate: 21.8 mmol/L (ref 20.0–28.0)
Calcium, Ion: 1.21 mmol/L (ref 1.15–1.40)
HCT: 32 % — ABNORMAL LOW (ref 36.0–46.0)
Hemoglobin: 10.9 g/dL — ABNORMAL LOW (ref 12.0–15.0)
O2 Saturation: 97 %
Potassium: 3.4 mmol/L — ABNORMAL LOW (ref 3.5–5.1)
Sodium: 142 mmol/L (ref 135–145)
TCO2: 23 mmol/L (ref 22–32)
pCO2 arterial: 35.6 mmHg (ref 32.0–48.0)
pH, Arterial: 7.396 (ref 7.350–7.450)
pO2, Arterial: 95 mmHg (ref 83.0–108.0)

## 2018-12-12 LAB — BLOOD GAS, ARTERIAL
Acid-base deficit: 0.7 mmol/L (ref 0.0–2.0)
Bicarbonate: 23.3 mmol/L (ref 20.0–28.0)
Drawn by: 511911
O2 Saturation: 88.1 %
Patient temperature: 98.6
pCO2 arterial: 37.1 mmHg (ref 32.0–48.0)
pH, Arterial: 7.414 (ref 7.350–7.450)
pO2, Arterial: 59.6 mmHg — ABNORMAL LOW (ref 83.0–108.0)

## 2018-12-12 LAB — BASIC METABOLIC PANEL
Anion gap: 12 (ref 5–15)
BUN: 13 mg/dL (ref 6–20)
CO2: 20 mmol/L — ABNORMAL LOW (ref 22–32)
Calcium: 9.5 mg/dL (ref 8.9–10.3)
Chloride: 106 mmol/L (ref 98–111)
Creatinine, Ser: 0.95 mg/dL (ref 0.44–1.00)
GFR calc Af Amer: >60 mL/min (ref >60)
GFR calc non Af Amer: >60 mL/min (ref >60)
Glucose, Bld: 119 mg/dL — ABNORMAL HIGH (ref 70–99)
Potassium: 4.5 mmol/L (ref 3.5–5.1)
Sodium: 138 mmol/L (ref 135–145)

## 2018-12-12 LAB — GLUCOSE, CAPILLARY
Glucose-Capillary: 148 mg/dL — ABNORMAL HIGH (ref 70–99)
Glucose-Capillary: 124 mg/dL — ABNORMAL HIGH (ref 70–99)
Glucose-Capillary: 161 mg/dL — ABNORMAL HIGH (ref 70–99)

## 2018-12-12 LAB — POCT I-STAT EG7
Acid-base deficit: 2 mmol/L (ref 0.0–2.0)
Bicarbonate: 22.9 mmol/L (ref 20.0–28.0)
Calcium, Ion: 1.22 mmol/L (ref 1.15–1.40)
HCT: 32 % — ABNORMAL LOW (ref 36.0–46.0)
Hemoglobin: 10.9 g/dL — ABNORMAL LOW (ref 12.0–15.0)
O2 Saturation: 75 %
Potassium: 3.4 mmol/L — ABNORMAL LOW (ref 3.5–5.1)
Sodium: 142 mmol/L (ref 135–145)
TCO2: 24 mmol/L (ref 22–32)
pCO2, Ven: 39.1 mmHg — ABNORMAL LOW (ref 44.0–60.0)
pH, Ven: 7.374 (ref 7.250–7.430)
pO2, Ven: 41 mmHg (ref 32.0–45.0)

## 2018-12-12 LAB — PREGNANCY, URINE: Preg Test, Ur: NEGATIVE

## 2018-12-12 LAB — POCT ACTIVATED CLOTTING TIME
Activated Clotting Time: 428 seconds
Activated Clotting Time: 296 seconds

## 2018-12-12 SURGERY — RIGHT/LEFT HEART CATH AND CORONARY ANGIOGRAPHY
Anesthesia: LOCAL

## 2018-12-12 MED ORDER — SODIUM CHLORIDE 0.9 % IV SOLN: 250.0000 mL | INTRAVENOUS | Status: DC | PRN

## 2018-12-12 MED ORDER — METOPROLOL TARTRATE 5 MG/5ML IV SOLN
INTRAVENOUS | Status: AC
  Filled 2018-12-12: qty 5

## 2018-12-12 MED ORDER — IOHEXOL 350 MG/ML SOLN
INTRAVENOUS | Status: DC | PRN
  Administered 2018-12-12: 10:00:00 210 mL via INTRACARDIAC

## 2018-12-12 MED ORDER — VERAPAMIL HCL 2.5 MG/ML IV SOLN
INTRAVENOUS | Status: DC | PRN
  Administered 2018-12-12: 09:00:00 10 mL via INTRA_ARTERIAL

## 2018-12-12 MED ORDER — VERAPAMIL HCL 2.5 MG/ML IV SOLN
INTRAVENOUS | Status: AC
  Filled 2018-12-12: qty 2

## 2018-12-12 MED ORDER — SODIUM CHLORIDE 0.9% FLUSH: 3.0000 mL | Freq: Two times a day (BID) | INTRAVENOUS | Status: DC

## 2018-12-12 MED ORDER — SODIUM CHLORIDE 0.9 % IV SOLN
INTRAVENOUS | Status: AC
  Administered 2018-12-12: 12:00:00 via INTRAVENOUS

## 2018-12-12 MED ORDER — ONDANSETRON HCL 4 MG/2ML IJ SOLN: 4.0000 mg | Freq: Four times a day (QID) | INTRAMUSCULAR | Status: DC | PRN

## 2018-12-12 MED ORDER — HEPARIN (PORCINE) IN NACL 1000-0.9 UT/500ML-% IV SOLN
INTRAVENOUS | Status: DC | PRN
  Administered 2018-12-12 (×3): 500 mL

## 2018-12-12 MED ORDER — CLOPIDOGREL BISULFATE 300 MG PO TABS
ORAL_TABLET | ORAL | Status: AC
  Filled 2018-12-12: qty 2

## 2018-12-12 MED ORDER — LABETALOL HCL 5 MG/ML IV SOLN: 10.0000 mg | INTRAVENOUS | Status: AC | PRN

## 2018-12-12 MED ORDER — CLOPIDOGREL BISULFATE 75 MG PO TABS: 75.0000 mg | ORAL_TABLET | Freq: Every day | ORAL | 2 refills | Status: DC

## 2018-12-12 MED ORDER — MIDAZOLAM HCL 2 MG/2ML IJ SOLN
INTRAMUSCULAR | Status: DC | PRN
  Administered 2018-12-12 (×2): 1 mg via INTRAVENOUS

## 2018-12-12 MED ORDER — HEPARIN SODIUM (PORCINE) 1000 UNIT/ML IJ SOLN
INTRAMUSCULAR | Status: AC
  Filled 2018-12-12: qty 1

## 2018-12-12 MED ORDER — HEPARIN (PORCINE) IN NACL 1000-0.9 UT/500ML-% IV SOLN
INTRAVENOUS | Status: AC
  Filled 2018-12-12: qty 1000

## 2018-12-12 MED ORDER — APIXABAN 5 MG PO TABS: 5.0000 mg | ORAL_TABLET | Freq: Two times a day (BID) | ORAL | 2 refills | Status: DC

## 2018-12-12 MED ORDER — MIDAZOLAM HCL 2 MG/2ML IJ SOLN
INTRAMUSCULAR | Status: AC
  Filled 2018-12-12: qty 2

## 2018-12-12 MED ORDER — LABETALOL HCL 5 MG/ML IV SOLN
INTRAVENOUS | Status: AC
  Filled 2018-12-12: qty 4

## 2018-12-12 MED ORDER — NITROGLYCERIN 1 MG/10 ML FOR IR/CATH LAB
INTRA_ARTERIAL | Status: DC | PRN
  Administered 2018-12-12 (×2): 200 ug via INTRACORONARY

## 2018-12-12 MED ORDER — HYDRALAZINE HCL 20 MG/ML IJ SOLN: 10.0000 mg | INTRAMUSCULAR | Status: AC | PRN

## 2018-12-12 MED ORDER — ASPIRIN 81 MG PO CHEW: 81.0000 mg | CHEWABLE_TABLET | Freq: Every day | ORAL | Status: DC

## 2018-12-12 MED ORDER — CLOPIDOGREL BISULFATE 300 MG PO TABS
ORAL_TABLET | ORAL | Status: DC | PRN
  Administered 2018-12-12: 600 mg via ORAL

## 2018-12-12 MED ORDER — FENTANYL CITRATE (PF) 100 MCG/2ML IJ SOLN
INTRAMUSCULAR | Status: AC
  Filled 2018-12-12: qty 2

## 2018-12-12 MED ORDER — HEPARIN (PORCINE) IN NACL 1000-0.9 UT/500ML-% IV SOLN
INTRAVENOUS | Status: AC
  Filled 2018-12-12: qty 500

## 2018-12-12 MED ORDER — LABETALOL HCL 5 MG/ML IV SOLN
INTRAVENOUS | Status: DC | PRN
  Administered 2018-12-12: 20 mg via INTRAVENOUS

## 2018-12-12 MED ORDER — COLCHICINE 0.6 MG PO TABS: 0.6000 mg | ORAL_TABLET | Freq: Two times a day (BID) | ORAL | 2 refills | Status: DC

## 2018-12-12 MED ORDER — NITROGLYCERIN 1 MG/10 ML FOR IR/CATH LAB
INTRA_ARTERIAL | Status: AC
  Filled 2018-12-12: qty 10

## 2018-12-12 MED ORDER — LIDOCAINE HCL (PF) 1 % IJ SOLN
INTRAMUSCULAR | Status: AC
  Filled 2018-12-12: qty 30

## 2018-12-12 MED ORDER — METOPROLOL TARTRATE 5 MG/5ML IV SOLN
INTRAVENOUS | Status: DC | PRN
  Administered 2018-12-12: 2.5 mg via INTRAVENOUS

## 2018-12-12 MED ORDER — CLOPIDOGREL BISULFATE 75 MG PO TABS: 75.0000 mg | ORAL_TABLET | Freq: Every day | ORAL | Status: DC

## 2018-12-12 MED ORDER — LIDOCAINE HCL (PF) 1 % IJ SOLN
INTRAMUSCULAR | Status: DC | PRN
  Administered 2018-12-12 (×2): 2 mL via INTRADERMAL

## 2018-12-12 MED ORDER — ACETAMINOPHEN 325 MG PO TABS: 650.0000 mg | ORAL_TABLET | ORAL | Status: DC | PRN

## 2018-12-12 MED ORDER — HEPARIN SODIUM (PORCINE) 1000 UNIT/ML IJ SOLN
INTRAMUSCULAR | Status: DC | PRN
  Administered 2018-12-12 (×2): 5000 [IU] via INTRAVENOUS

## 2018-12-12 MED ORDER — FENTANYL CITRATE (PF) 100 MCG/2ML IJ SOLN
INTRAMUSCULAR | Status: DC | PRN
  Administered 2018-12-12 (×2): 25 ug via INTRAVENOUS

## 2018-12-12 MED ORDER — SODIUM CHLORIDE 0.9% FLUSH: 3.0000 mL | INTRAVENOUS | Status: DC | PRN

## 2018-12-12 MED FILL — ELIQUIS 5 MG TABLET: 5 | 30 days supply | Qty: 60 | Fill #0

## 2018-12-12 MED FILL — COLCHICINE 0.6 MG TABS: 0.6 | 30 days supply | Qty: 60 | Fill #0

## 2018-12-12 MED FILL — CLOPIDOGREL 75 MG TABLET: 75 | 30 days supply | Qty: 30 | Fill #0 | Status: TO

## 2018-12-12 SURGICAL SUPPLY — 25 items
BALLN EMERGE MR 3.5X12 (BALLOONS) ×2
BALLN SAPPHIRE 3.0X12 (BALLOONS) ×2
BALLN SAPPHIRE ~~LOC~~ 4.0X8 (BALLOONS) ×2 IMPLANT
BALLN WOLVERINE 3.75X6 (BALLOONS) ×2
BALLOON EMERGE MR 3.5X12 (BALLOONS) ×1 IMPLANT
BALLOON SAPPHIRE 3.0X12 (BALLOONS) ×1 IMPLANT
BALLOON WOLVERINE 3.75X6 (BALLOONS) ×1 IMPLANT
CATH 5FR JL3.5 JR4 ANG PIG MP (CATHETERS) ×2 IMPLANT
CATH BALLN WEDGE 5F 110CM (CATHETERS) ×2 IMPLANT
CATH LAUNCHER 5F EBU3.0 (CATHETERS) ×1 IMPLANT
CATH LAUNCHER 6FR EBU 3 (CATHETERS) ×2 IMPLANT
CATHETER LAUNCHER 5F EBU3.0 (CATHETERS) ×2
DEVICE RAD COMP TR BAND LRG (VASCULAR PRODUCTS) ×2 IMPLANT
GLIDESHEATH SLEND A-KIT 6F 22G (SHEATH) ×2 IMPLANT
GUIDEWIRE .025 260CM (WIRE) ×2 IMPLANT
KIT ENCORE 26 ADVANTAGE (KITS) ×2 IMPLANT
KIT HEART LEFT (KITS) ×2 IMPLANT
KIT HEMO VALVE WATCHDOG (MISCELLANEOUS) ×2 IMPLANT
PACK CARDIAC CATHETERIZATION (CUSTOM PROCEDURE TRAY) ×2 IMPLANT
SHEATH GLIDE SLENDER 4/5FR (SHEATH) ×2 IMPLANT
STENT SYNERGY DES 3.5X16 (Permanent Stent) ×2 IMPLANT
TRANSDUCER W/STOPCOCK (MISCELLANEOUS) ×4 IMPLANT
TUBING CIL FLEX 10 FLL-RA (TUBING) ×2 IMPLANT
WIRE ASAHI PROWATER 180CM (WIRE) ×2 IMPLANT
WIRE COUGAR XT STRL 190CM (WIRE) ×2 IMPLANT

## 2018-12-12 NOTE — Discharge Summary (Signed)
Physician Discharge Summary  Patient ID: [name] MRN: [id number] DOB/AGE: 01/20/1976 43 y.o.  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  Patient was initially found to be in atrial fibrillation with RVR.  At this point, she had been off Eliquis for several weeks after her successful flutter ablation.  Thus, I decided not to proceed with rhythm control therapy and instead started on diltiazem IV drip along with oral metoprolol.  Heart rate was initially well controlled, with subsequent self conversion to sinus rhythm.  However, patient continued to be in sinus tachycardia with frequent PACs.  At this point, differentials for her symptoms were wide, include CAD with angina equivalent, interstitial lung disease, and possibility of constrictive pericarditis.  Infectious etiology, including pneumonia, was clinically excluded after discussion with pulmonology.  She did not have any fever, leukocytosis, procalcitonin was negative.  Regarding her tachycardia, d-dimer was mildly elevated, but CTA did not show PE.  Thyroid function was normal.    I proposed to the patient that we perform right and elft heart cath to evaluate  for constriction physiology. Also, she has an at least 50% prox LAD lesion seen on 03/2018. As we delineate her symptomatology, it would be prudent to study this lesion with dFR, if it is intermediate, or revascularize if it severe, given that she is already on optimal medical therapy.   Left and right heart catheterization on 12/12/2018 prove that there was no constrictive physiology.  However the proximal LAD lesion previously seen to be moderate on coronary angiography in August 2019, appears to have progressed to a high-grade 90% lesion.  Thus, I proceeded with intervention to this lesion.  I placed Synergy 3.5 x 16 mm drug-eluting stent with postdilatation with excellent results and no residual stenosis.  Patient did note improvement in shortness of breath symptoms after successful coronary revascularization.  Her resting tachycardia has also improved.  Recommend Aspirin/plavix/Eliquis for 1 month. Then stop Aspirin, continue eliquis and plavix for 6 months.   Patients medical complicities remain complex. She still needs aggressive management for her lupus and interstitial lung disease. Should she need lung biopsy in the near future, plavix could potentially be stopped after 3 months use ie in 03/2019.  Patient was seen by pulmonology during this hospitalization.  She is thought to have interstitial lung disease for which she needs outpatient work-up including PFT.  Prednisone was suggested by both rheumatology and pulmonology.  I started the patient on 20 mg daily.  She received 2 doses.  However, it was brought to my attention that [facility] would [facility] be [facility].  Since patient has follow-up appointment with rheumatology on Monday, 12/15/2018, it is reasonable to proceed with prednisone therapy after this office visit.  I will see the patient for transition of care follow-up on 12/16/2018.  Discharge Exam: Blood pressure  117/89, pulse 89, temperature 98.1 F (36.7 C),  temperature source Oral, resp. rate 20, height 5' (1.524 m), weight 102.4 kg, SpO2 100 %.    Constitutional: She is oriented to person, place, and time. She appears well-developed and well-nourished. No distress.  HENT:  Head: Normocephalic and atraumatic.  Eyes: Pupils are equal, round, and reactive to light. Conjunctivae are normal.  Neck: No JVD present.  Cardiovascular: Intact distal pulses. Regular rhythm. Mild aortic stenosis murmur Pulmonary/Chest: Effort normal and breath sounds normal. She has no wheezes. She has no rales.  Abdominal: Soft. Bowel sounds are normal. There is no rebound.  Musculoskeletal:        General: No edema present.  Lymphadenopathy:    She has no cervical adenopathy.  Neurological: She is alert and oriented to person, place, and time. No cranial nerve deficit.  Skin: Skin is warm and dry.  Psychiatric: She has a normal mood and affect.  Nursing note and vitals reviewed.   Recommendations on discharge:   Avoid lifting heavy objects with right arm.   Significant Diagnostic Studies:  CARDIAC STUDIES:  EKG 12/12/2018: Sinus rhythm, aberrant conducted PAC's  R/LHC, coronary angiography and intervention 12/12/2018: RA: 10 mmHg RV: 46/7 mmHg. RVEDP 8 mmHg PA: 48/22 mmHg. Mean PA 33 mmHg PW: 17 mmHg LV 155/7 mmHg. LVEDP 10 mmHg <10 mmHg LV-Ao pullback gradient CO 7.8 L/min. CI 4 L/min/m2  Simultaneous RV-LV pressure tracings do not show interdependence to suggest constriction physiology.  LM: Wall calcium with no significant stenosis LAD: Ostial-proximal 90% stenosis. Normal diagonal branches. Successful PTCA and stent placement pLAD Synergy DES 3.5X16 mm DES Post dilatation with 4.0 X 8 mm [place] balloon. 0% residual stenosis Ramus: Normal LCx: Normal RCA: Normal  Echocardiogram 12/10/2018:  1. The left ventricle has low normal systolic function, with an ejection fraction of 50-55%. No  evidence of left ventricular regional wall motion abnormalities.  2. Moderate posteriorly located pericardial effusion with no signs of tamponade.  3. This is a limited study with no significant change compared to previous study on 11/20/2018.  Labs:   Lab Results  Component Value Date   WBC 5.5 12/11/2018   HGB 10.9 (L) 12/12/2018   HCT 32.0 (L) 12/12/2018   MCV 74.5 (L) 12/11/2018   PLT 396 12/11/2018    Recent Labs  Lab 12/08/18 1526  12/12/18 0327  12/12/18 0851  NA 140   < > 138   < > 142  K 3.1*   < > 4.5   < > 3.4*  CL 101   < > 106  --   --   CO2 27   < > 20*  --   --   BUN 7   < > 13  --   --   CREATININE 0.85   < > 0.95  --   --   CALCIUM 9.1   < > 9.5  --   --   PROT 8.1  --   --   --   --   BILITOT 0.8  --   --   --   --   ALKPHOS 73  --   --   --   --   ALT 11  --   --   --   --   AST 20  --   --   --   --   GLUCOSE 156*   < > 119*  --   --    < > = values in this interval not displayed.    Lipid Panel  Component Value Date/Time   CHOL  08/18/2008 1746    179        ATP III CLASSIFICATION:  <200     mg/dL   Desirable  200-239  mg/dL   Borderline High  >=240    mg/dL   High          TRIG 100 08/18/2008 1746   HDL 37 (L) 08/18/2008 1746   CHOLHDL 4.8 08/18/2008 1746   VLDL 20 08/18/2008 1746   LDLCALC (H) 08/18/2008 1746    122        Total Cholesterol/HDL:CHD Risk Coronary Heart Disease Risk Table                     Men   Women  1/2 Average Risk   3.4   3.3  Average Risk       5.0   4.4  2 X Average Risk   9.6   7.1  3 X Average Risk  23.4   11.0        Use the calculated Patient Ratio above and the CHD Risk Table to determine the patient's CHD Risk.        ATP III CLASSIFICATION (LDL):  <100     mg/dL   Optimal  100-129  mg/dL   Near or Above                    Optimal  130-159  mg/dL   Borderline  160-189  mg/dL   High  >190     mg/dL   Very High    BNP (last 3 results) Recent Labs    03/13/18 0218 05/12/18 1002  12/08/18 1526  BNP 229.2* 302.7* 351.5*    HEMOGLOBIN A1C Lab Results  Component Value Date   HGBA1C 6.2 (A) 09/16/2018   MPG 214.47 03/13/2018    Cardiac Panel (last 3 results) Recent Labs    03/13/18 0952 03/13/18 1543 03/13/18 2045 12/08/18 1547  CKTOTAL  --   --   --  143  TROPONINI 0.03* 0.03* 0.03*  --     Lab Results  Component Value Date   CKTOTAL 143 12/08/2018   CKMB 0.9 09/09/2008   TROPONINI 0.03 (HH) 03/13/2018     TSH Recent Labs    03/13/18 0218 05/12/18 2022 12/11/18 0941  TSH 1.041 0.817 2.343    Radiology: Dg Chest 2 View  Result Date: 12/10/2018 CLINICAL DATA:  Shortness of breath and tachycardia EXAM: CHEST - 2 VIEW COMPARISON:  December 09, 2018 and December 08, 2018 FINDINGS: There remains multifocal airspace opacity throughout the right lung, essentially stable compared to 1 day prior but representing a definite change from 2 days prior. Left lung is clear. There is cardiomegaly with pulmonary vascularity normal. No adenopathy. No bone lesions. IMPRESSION: Multifocal airspace opacity throughout the right lung, felt to represent multifocal pneumonia. Left lung clear. Stable cardiomegaly. No evident adenopathy. Electronically Signed   By: Lowella Grip III M.D.   On: 12/10/2018 12:07   Dg Chest 2 View  Result Date: 12/09/2018 CLINICAL DATA:  Fluid overload and shortness of breath. EXAM: CHEST - 2 VIEW COMPARISON:  December 08, 2018 FINDINGS: Stable cardiomegaly. The hila and mediastinum are unchanged. No pneumothorax. Opacity in the right lower lobe is stable. Opacity in the right perihilar region superiorly is more pronounced. No other interval changes. IMPRESSION: 1. Increasing infiltrate in the right suprahilar region and stable infiltrate in the right base concerning for multifocal  pneumonia. No other acute abnormalities. Electronically Signed   By: Dorise Bullion III M.D   On: 12/09/2018 12:13   Ct Angio Chest Pe W Or Wo Contrast  Result Date:  12/11/2018 CLINICAL DATA:  Shortness of breath and tachycardia EXAM: CT ANGIOGRAPHY CHEST WITH CONTRAST TECHNIQUE: Multidetector CT imaging of the chest was performed using the standard protocol during bolus administration of intravenous contrast. Multiplanar CT image reconstructions and MIPs were obtained to evaluate the vascular anatomy. CONTRAST:  35m OMNIPAQUE IOHEXOL 350 MG/ML SOLN COMPARISON:  Chest CT September 24, 2018 and chest radiograph December 10, 2018 FINDINGS: Cardiovascular: There is no demonstrable pulmonary embolus. There is no appreciable thoracic aortic aneurysm or dissection. There are foci of aortic atherosclerosis. Visualized great vessels appear normal. There is a sizable pericardial effusion. The pericardium does not appear thickened. There is prominence of the main pulmonary outflow tract measuring 3.3 cm. Mediastinum/Nodes: Visualized thyroid appears normal. There are several scattered subcentimeter mediastinal lymph nodes. There is no adenopathy by size criteria. No esophageal lesions are evident. Lungs/Pleura: There is patchy airspace opacity in both upper lobes, more on the right than on the left. Similar opacity is noted in the superior segment right lower lobe. There is bibasilar atelectasis. There are no appreciable pleural effusions. There is slight interstitial pulmonary edema. Upper Abdomen: There is reflux of contrast into the inferior vena cava and slight reflux into a patent veins. Visualized upper abdominal structures otherwise appear normal. Musculoskeletal: No blastic or lytic bone lesions. No chest wall lesions are evident. Review of the MIP images confirms the above findings. IMPRESSION: 1. No demonstrable pulmonary embolus. There is aortic atherosclerosis. No thoracic aortic aneurysm or dissection. 2.  Sizable pericardial effusion. 3. Prominence of the main pulmonary outflow tract, a finding likely indicative of pulmonary arterial hypertension. , 4. Slight interstitial  edema. Multifocal airspace opacity, more notable on the right than on the left. The appearance is more suggestive of multifocal pneumonia and alveolar edema. A degree of pneumonia and alveolar edema superimposed may well be present. 5. Reflux of contrast into the inferior vena cava and hepatic veins may be indicative of a degree of increase in right heart pressure. Aortic Atherosclerosis (ICD10-I70.0). Electronically Signed   By: WLowella GripIII M.D.   On: 12/11/2018 13:24   Dg Chest Port 1 View  Result Date: 12/08/2018 CLINICAL DATA:  43year old female with shortness of breath EXAM: PORTABLE CHEST 1 VIEW COMPARISON:  CT 09/24/2018, plain film 05/23/2018 FINDINGS: Cardiomediastinal silhouette unchanged with cardiomegaly. Reticular pattern of opacity of the bilateral lungs with peribronchial thickening. Low lung volumes. No pleural effusion or pneumothorax. IMPRESSION: Nonspecific reticular pattern of opacity, may represent interstitial lung disease which was previously seen on CT versus superimposed multifocal infection. Electronically Signed   By: JCorrie MckusickD.O.   On: 12/08/2018 16:52      FOLLOW UP PLANS AND APPOINTMENTS  TOC w/me 12/16/2018 Rheumatology f/u 12/15/2018 Pulmonology f/u 01/12/2019  Discharge Instructions    AMB Referral to Cardiac Rehabilitation - Phase II   Complete by:  As directed    Diagnosis:  Coronary Stents   Diet - low sodium heart healthy   Complete by:  As directed    Increase activity slowly   Complete by:  As directed      Allergies as of 12/12/2018      Reactions   Celery Oil Shortness Of Breath, Itching, Rash, Other (See Comments)   Bumps on tongue, also   Peanut-containing Drug Products Anaphylaxis, Shortness  Of Breath, Itching, Rash, Other (See Comments)   Bumps on tongue, also   Septra [bactrim] Hives      Medication List    STOP taking these medications   amLODipine 10 MG tablet Commonly known as:  NORVASC   amLODipine 5 MG  tablet Commonly known as:  NORVASC   potassium chloride 10 MEQ tablet Commonly known as:  K-DUR     TAKE these medications   acetaminophen 500 MG tablet Commonly known as:  TYLENOL Take 1,000 mg by mouth every 6 (six) hours as needed for headache (pain).   acetaminophen 325 MG tablet Commonly known as:  TYLENOL Take 2 tablets (650 mg total) by mouth every 4 (four) hours as needed for headache or mild pain.   albuterol 108 (90 Base) MCG/ACT inhaler Commonly known as:  VENTOLIN HFA Inhale 2 puffs into the lungs every 6 (six) hours as needed for wheezing or shortness of breath.   ALKA-SELTZER PLUS COLD & COUGH PO Take 2 tablets by mouth every 4 (four) hours as needed (cough/congestion).   apixaban 5 MG Tabs tablet Commonly known as:  ELIQUIS Take 1 tablet (5 mg total) by mouth 2 (two) times daily.   aspirin EC 81 MG tablet Take 81 mg by mouth daily.   azaTHIOprine 50 MG tablet Commonly known as:  IMURAN Take 50 mg by mouth 2 (two) times daily.   benzonatate 200 MG capsule Commonly known as:  TESSALON Take 1 capsule (200 mg total) by mouth 3 (three) times daily as needed for cough.   cetirizine 10 MG tablet Commonly known as:  ZYRTEC Take 1 tablet (10 mg total) by mouth daily.   clopidogrel 75 MG tablet Commonly known as:  PLAVIX Take 1 tablet (75 mg total) by mouth daily with breakfast. Start taking on:  Dec 13, 2018   colchicine 0.6 MG tablet Take 1 tablet (0.6 mg total) by mouth 2 (two) times daily. What changed:  when to take this   diltiazem 240 MG 24 hr capsule Commonly known as:  CARDIZEM CD Take 1 capsule (240 mg total) by mouth daily.   furosemide 40 MG tablet Commonly known as:  LASIX TAKE 1 TABLET (40 MG TOTAL) BY MOUTH 2 (TWO) TIMES DAILY.   glucose blood test strip Commonly known as:  True Metrix Blood Glucose Test Use as instructed   guaiFENesin-dextromethorphan 100-10 MG/5ML syrup Commonly known as:  ROBITUSSIN DM Take 15 mLs by mouth every 4  (four) hours as needed for cough.   hydroxychloroquine 200 MG tablet Commonly known as:  PLAQUENIL Take 1 tablet (200 mg total) by mouth 2 (two) times daily.   insulin glargine 100 UNIT/ML injection Commonly known as:  LANTUS Inject 0.08 mLs (8 Units total) into the skin daily before breakfast. What changed:  how much to take   medroxyPROGESTERone 150 MG/ML injection Commonly known as:  DEPO-PROVERA Inject 150 mg into the muscle every 3 (three) months.   metFORMIN 1000 MG tablet Commonly known as:  GLUCOPHAGE Take 1 tablet (1,000 mg total) by mouth 2 (two) times daily with a meal.   metoprolol tartrate 50 MG tablet Commonly known as:  LOPRESSOR Take 1 tablet (50 mg total) by mouth 2 (two) times daily. What changed:    medication strength  how much to take   omeprazole 20 MG capsule Commonly known as:  PriLOSEC Take 1 capsule (20 mg total) by mouth daily as needed (for heartburn or GERD-like symptoms). What changed:  when to take this  potassium chloride SA 20 MEQ tablet Commonly known as:  K-DUR Take 2 tablets (40 mEq total) by mouth 2 (two) times daily.   predniSONE 20 MG tablet Commonly known as:  DELTASONE Take 1 tablet (20 mg total) by mouth daily with breakfast.   rosuvastatin 20 MG tablet Commonly known as:  CRESTOR Take 1 tablet (20 mg total) by mouth daily at 6 PM.   True Metrix Meter w/Device Kit Check blood sugars fasting and at bedtime   TRUEplus Lancets 28G Misc Check blood sugars as directed      Follow-up Information    [facility] Follow up on 12/22/2018.   Why:  @ 2:30 for hospital follow upw tih MD Wynetta Emery. If you cannot make this scheduled appointment please call the office to reschedule.  Contact information: [place] [id number] [phone]       Magdalen Spatz, NP Follow up on 01/12/2019.   Specialty:  Pulmonary Disease Why:  Appointment is scheduled for 10 am. Please  arrive 15 minutes early as you are new to the practice and this will allow for paperwork to be completed Contact information: [place] [address] [phone]        [name], MD Follow up on 12/16/2018.   Specialties:  Cardiology, Radiology Why:  11:30 AM Contact information: [place] [address] [phone]            Vernell Leep MD, [facility] Pager: [phone] Office: [phone] If no answer: [phone]

## 2018-12-12 NOTE — Interval H&P Note (Signed)
History and Physical Interval Note:  12/12/2018 8:30 AM  [name]  has presented today for surgery, with the diagnosis of constrictive Pericarditis.  The various methods of treatment have been discussed with the patient and family. After consideration of risks, benefits and other options for treatment, the patient has consented to  Procedure(s): RIGHT/LEFT HEART CATH AND CORONARY ANGIOGRAPHY (N/A) as a surgical intervention.  The patient's history has been reviewed, patient examined, no change in status, stable for surgery.  I have reviewed the patient's chart and labs.  Questions were answered to the patient's satisfaction.    2012 Appropriate Use Criteria for Diagnostic Catheterization Home / Select Test of Interest Indication for RHC Pericardial Disease  Pericardial Diseases (Right and Left Heart Catheterization OR Right Heart Catheterization Alone With Pericardial Diseases  (Right and Left Heart Catheterization OR Right Heart Catheterization Alone With/Without Left Ventriculography and Coronary Angiography) Link Here: [email or web address] Indication:  1. Suspected or clinical uncertainty between constrictive vs. restrictive physiology A (8) Indication: 92; Score 8     Sanders Manninen J Oanh Devivo

## 2018-12-13 NOTE — Progress Notes (Signed)
Pt d/c'd last night. Called pt today to review education. Discussed stent, Plavix/ASA, restrictions, diet (esp carb modified), walking as tolerated, and CRPII. Pt receptive and appreciative of information. Will refer to [place] (order put in by Dr. Virgina Jock) and send education materials in mail.  [place], ACSM 12:56 PM 12/13/2018

## 2018-12-15 ENCOUNTER — Telehealth: Payer: Self-pay

## 2018-12-15 MED FILL — azaTHIOprine 50 MG TABS: 50 | 30 days supply | Qty: 60 | Fill #0

## 2018-12-15 NOTE — Telephone Encounter (Signed)
Location of hospitalization: [facility] Reason for hospitalization: CAD s/p PCI Date of discharge: 5/1//20 Date of first communication with patient: 12/15/18 Person contacting patient: Debbi Strandberg b, Current symptoms: NONE Do you understand why you were in the Hospital: Yes Questions regarding discharge instructions: None Where were you discharged to: Home Medications reviewed: Yes Allergies reviewed: Yes Dietary changes reviewed: Yes. Referals reviewed: NA Activities of Daily Living: Able to with mild limitations Any transportation issues/concerns: None Any patient concerns: None Confirmed importance & date/time of Follow up appt: Yes 12/16/18  Confirmed with patient if condition begins to worsen call. Pt was given the office number and encouraged to call back with questions or concerns: Yes

## 2018-12-15 NOTE — Telephone Encounter (Signed)
Transition Care Management Follow-up Telephone Call Date of discharge and from where: 12/12/2018, [facility]  Call placed to patient # [phone], message left requesting call back to this CM # [phone].

## 2018-12-15 NOTE — Telephone Encounter (Signed)
-----   Message from [name], MD sent at 12/12/2018 10:14 PM EDT ----- Regarding: TOC Discharge follow up: TOC: Needed Follow up appt: 12/16/2018 Discharge diagnosis: CAD s/p PCI Discharge date: 12/12/2018  Thanks MJP

## 2018-12-16 ENCOUNTER — Telehealth: Payer: Self-pay

## 2018-12-16 ENCOUNTER — Other Ambulatory Visit: Payer: Self-pay

## 2018-12-16 ENCOUNTER — Ambulatory Visit (INDEPENDENT_AMBULATORY_CARE_PROVIDER_SITE_OTHER): Payer: 59 | Admitting: Cardiology

## 2018-12-16 ENCOUNTER — Encounter: Payer: Self-pay | Admitting: Cardiology

## 2018-12-16 VITALS — BP 170/110 | HR 108 | Temp 97.0°F | Ht 60.0 in | Wt 223.0 lb

## 2018-12-16 DIAGNOSIS — I10 Essential (primary) hypertension: Secondary | ICD-10-CM

## 2018-12-16 DIAGNOSIS — I3139 Other pericardial effusion (noninflammatory): Secondary | ICD-10-CM

## 2018-12-16 DIAGNOSIS — R0602 Shortness of breath: Secondary | ICD-10-CM

## 2018-12-16 DIAGNOSIS — I313 Pericardial effusion (noninflammatory): Secondary | ICD-10-CM

## 2018-12-16 MED ORDER — POTASSIUM CHLORIDE CRYS ER 20 MEQ PO TBCR: 40.0000 meq | EXTENDED_RELEASE_TABLET | Freq: Every day | ORAL | 2 refills | Status: DC

## 2018-12-16 MED ORDER — LOSARTAN POTASSIUM 50 MG PO TABS: 50.0000 mg | ORAL_TABLET | Freq: Every day | ORAL | 3 refills | Status: DC

## 2018-12-16 MED ORDER — COLCHICINE 0.6 MG PO TABS: 0.6000 mg | ORAL_TABLET | Freq: Every day | ORAL | 1 refills | Status: AC

## 2018-12-16 MED ORDER — DILTIAZEM HCL ER COATED BEADS 240 MG PO CP24: 240.0000 mg | ORAL_CAPSULE | Freq: Every day | ORAL | 2 refills | Status: DC

## 2018-12-16 NOTE — Progress Notes (Signed)
Subjective:   [name], female    DOB: Oct 14, 1975, 43 y.o.   MRN: [id number]   Chief complaint:  Shortness of breath  HPI  43 year old female with long-standing SLE, hypertension, type 2 diabetes mellitus, CAD s/p pLAD PCI for critical stenosis (12/12/2018), paroxysmal Afib w/RVR, moderate aortic stenosis, stable pericardial effusion without tamponade or constriction (Cath 12/2018), interstitial lung disease, here for transition of care follow up.  Patient has noticed improvement in her shortness of breath symptoms since the PCI. She has been taking 120 mg diltiazem, not 240 mg daily. BP is elevated today. She saw her rheumatologist Marella Chimes, PA yesterday and has follow up visit with Maryanna Shape pulmonology on 01/12/2019.   Past Medical History:  Diagnosis Date  . Abnormal Pap smear of cervix    colpo, HPV  . Allergy   . Anemia   . Atrial fibrillation ([place])   . CHF (congestive heart failure) ([place])   . Depression    after losses  . Enlarged heart    managed by cardiology  . Fibroid   . Gestational diabetes   . Heart murmur   . Hypertension   . Infection    UTI  . Lupus ([place]) 1999     Past Surgical History:  Procedure Laterality Date  . A-FLUTTER ABLATION N/A 06/10/2018   Procedure: A-FLUTTER ABLATION;  Surgeon: Evans Lance, MD;  Location: [facility];  Service: Cardiovascular;  Laterality: N/A;  . CARDIAC CATHETERIZATION    . CORONARY STENT INTERVENTION N/A 12/12/2018   Procedure: CORONARY STENT INTERVENTION;  Surgeon: [name], MD;  Location: [facility];  Service: Cardiovascular;  Laterality: N/A;  lad   . RIGHT/LEFT HEART CATH AND CORONARY ANGIOGRAPHY N/A 03/14/2018   Procedure: RIGHT/LEFT HEART CATH AND CORONARY ANGIOGRAPHY;  Surgeon: [name], MD;  Location: [facility];  Service: Cardiovascular;  Laterality: N/A;  . RIGHT/LEFT HEART CATH AND CORONARY ANGIOGRAPHY N/A 12/12/2018   Procedure: RIGHT/LEFT HEART CATH  AND CORONARY ANGIOGRAPHY;  Surgeon: [name], MD;  Location: [facility];  Service: Cardiovascular;  Laterality: N/A;  . THERAPEUTIC ABORTION       Social History   Socioeconomic History  . Marital status: Married    Spouse name: Not on file  . Number of children: 2  . Years of education: Not on file  . Highest education level: Not on file  Occupational History  . Not on file  Social Needs  . Financial resource strain: Not on file  . Food insecurity:    Worry: Patient refused    Inability: Patient refused  . Transportation needs:    Medical: Patient refused    Non-medical: Patient refused  Tobacco Use  . Smoking status: Never Smoker  . Smokeless tobacco: Never Used  Substance and Sexual Activity  . Alcohol use: Yes    Comment: ocasionally   . Drug use: No  . Sexual activity: Yes    Birth control/protection: Injection  Lifestyle  . Physical activity:    Days per week: Patient refused    Minutes per session: Patient refused  . Stress: Not on file  Relationships  . Social connections:    Talks on phone: Patient refused    Gets together: Patient refused    Attends religious service: Patient refused    Active member of club or organization: Patient refused    Attends meetings of clubs or organizations: Patient refused    Relationship status:  Patient refused  . Intimate partner violence:    Fear of current or ex partner: Patient refused    Emotionally abused: Patient refused    Physically abused: Patient refused    Forced sexual activity: Patient refused  Other Topics Concern  . Not on file  Social History Narrative  . Not on file     Current Outpatient Medications on File Prior to Visit  Medication Sig Dispense Refill  . acetaminophen (TYLENOL) 325 MG tablet Take 2 tablets (650 mg total) by mouth every 4 (four) hours as needed for headache or mild pain.    Marland Kitchen acetaminophen (TYLENOL) 500 MG tablet Take 1,000 mg by mouth every 6 (six) hours as  needed for headache (pain).    Marland Kitchen albuterol (PROVENTIL HFA;VENTOLIN HFA) 108 (90 Base) MCG/ACT inhaler Inhale 2 puffs into the lungs every 6 (six) hours as needed for wheezing or shortness of breath. 1 Inhaler 2  . apixaban (ELIQUIS) 5 MG TABS tablet Take 1 tablet (5 mg total) by mouth 2 (two) times daily. 60 tablet 2  . aspirin EC 81 MG tablet Take 81 mg by mouth daily.    Marland Kitchen azaTHIOprine (IMURAN) 50 MG tablet Take 50 mg by mouth 2 (two) times daily.    . benzonatate (TESSALON) 200 MG capsule Take 1 capsule (200 mg total) by mouth 3 (three) times daily as needed for cough. 30 capsule 0  . cetirizine (ZYRTEC) 10 MG tablet Take 1 tablet (10 mg total) by mouth daily. 90 tablet 1  . clopidogrel (PLAVIX) 75 MG tablet Take 1 tablet (75 mg total) by mouth daily with breakfast. 60 tablet 2  . colchicine 0.6 MG tablet Take 1 tablet (0.6 mg total) by mouth 2 (two) times daily. 60 tablet 2  . diltiazem (CARDIZEM CD) 240 MG 24 hr capsule Take 1 capsule (240 mg total) by mouth daily. (Patient taking differently: Take 140 mg by mouth daily. ) 30 capsule 2  . furosemide (LASIX) 40 MG tablet TAKE 1 TABLET (40 MG TOTAL) BY MOUTH 2 (TWO) TIMES DAILY. 60 tablet 2  . hydroxychloroquine (PLAQUENIL) 200 MG tablet Take 1 tablet (200 mg total) by mouth 2 (two) times daily. 60 tablet 4  . insulin glargine (LANTUS) 100 UNIT/ML injection Inject 0.08 mLs (8 Units total) into the skin daily before breakfast. (Patient taking differently: Inject 16 Units into the skin daily before breakfast. ) 10 mL 3  . medroxyPROGESTERone (DEPO-PROVERA) 150 MG/ML injection Inject 150 mg into the muscle every 3 (three) months.    . metFORMIN (GLUCOPHAGE) 1000 MG tablet Take 1 tablet (1,000 mg total) by mouth 2 (two) times daily with a meal. 60 tablet 3  . metoprolol tartrate (LOPRESSOR) 50 MG tablet Take 1 tablet (50 mg total) by mouth 2 (two) times daily. 90 tablet 3  . omeprazole (PRILOSEC) 20 MG capsule Take 1 capsule (20 mg total) by mouth  daily as needed (for heartburn or GERD-like symptoms). (Patient taking differently: Take 20 mg by mouth daily. ) 30 capsule 3  . Phenyleph-CPM-DM-Aspirin (ALKA-SELTZER PLUS COLD & COUGH PO) Take 2 tablets by mouth every 4 (four) hours as needed (cough/congestion).    . potassium chloride SA (K-DUR) 20 MEQ tablet Take 2 tablets (40 mEq total) by mouth 2 (two) times daily. 60 tablet 2  . predniSONE (DELTASONE) 20 MG tablet Take 1 tablet (20 mg total) by mouth daily with breakfast. 7 tablet 0  . rosuvastatin (CRESTOR) 20 MG tablet Take 1 tablet (20 mg total) by  mouth daily at 6 PM. 30 tablet 4  . Blood Glucose Monitoring Suppl (TRUE METRIX METER) w/Device KIT Check blood sugars fasting and at bedtime 1 kit 0  . glucose blood (TRUE METRIX BLOOD GLUCOSE TEST) test strip Use as instructed 100 each 12  . TRUEPLUS LANCETS 28G MISC Check blood sugars as directed 100 each 1   No current facility-administered medications on file prior to visit.     Cardiovascular studies:  EKG 12/16/2018: Sinus tachycardia 106 bpm Right axis deviation  Significant Diagnostic Studies:  CARDIAC STUDIES:  EKG 12/12/2018: Sinus rhythm, aberrant conducted PAC's  R/LHC, coronary angiography and intervention 12/12/2018: RA: 10 mmHg RV: 46/7 mmHg. RVEDP 8 mmHg PA: 48/22 mmHg. Mean PA 33 mmHg PW: 17 mmHg LV 155/7 mmHg. LVEDP 10 mmHg <10 mmHg LV-Ao pullback gradient CO 7.8 L/min. CI 4 L/min/m2  Simultaneous RV-LV pressure tracings do not show interdependence to suggest constriction physiology.  LM: Wall calcium with no significant stenosis LAD: Ostial-proximal 90% stenosis. Normal diagonal branches. Successful PTCA and stent placement pLAD Synergy DES 3.5X16 mm DES Post dilatation with 4.0 X 8 mm [place] balloon. 0% residual stenosis Ramus: Normal LCx: Normal RCA: Normal  Echocardiogram 12/10/2018: 1. The left ventricle has low normal systolic function, with an ejection fraction of 50-55%. No evidence of  left ventricular regional wall motion abnormalities. 2. Moderate posteriorly located pericardial effusion with no signs of tamponade. 3. This is a limited study with no significant change compared to previous study on 11/20/2018.  Recent labs: Results for Katherine, Pittman (MRN [id number]) as of 12/16/2018 15:15  Ref. Range 12/11/2018 03:35 12/12/2018 03:27 12/12/2018 08:50 12/12/2018 45:99  BASIC METABOLIC PANEL Unknown Rpt (A) Rpt (A)    Sodium Latest Ref Range: 135 - 145 mmol/L 136 138 142 142  Potassium Latest Ref Range: 3.5 - 5.1 mmol/L 3.7 4.5 3.4 (L) 3.4 (L)  Chloride Latest Ref Range: 98 - 111 mmol/L 104 106    CO2 Latest Ref Range: 22 - 32 mmol/L 21 (L) 20 (L)    Glucose Latest Ref Range: 70 - 99 mg/dL 119 (H) 119 (H)    BUN Latest Ref Range: 6 - 20 mg/dL 12 13    Creatinine Latest Ref Range: 0.44 - 1.00 mg/dL 0.88 0.95    Calcium Latest Ref Range: 8.9 - 10.3 mg/dL 9.4 9.5    Anion gap Latest Ref Range: 5 - _0 Calcium Ionized Latest Ref Range: 1.15 - 1.40 mmol/L   1.22 1.21  GFR, Est Non African American Latest Ref Range: >60 mL/min >60 >60    GFR, Est African American Latest Ref Range: >60 mL/min >60 >60    Procalcitonin Latest Units: ng/mL <0.10      Results for Katherine, Pittman (MRN [id number]) as of 12/16/2018 15:15  Ref. Range 12/10/2018 08:54 12/11/2018 09:41 12/12/2018 08:50 12/12/2018 08:51  WBC Latest Ref Range: 4.0 - 10.5 K/uL 5.9 5.5    RBC Latest Ref Range: 3.87 - 5.11 MIL/uL 4.49 4.58    Hemoglobin Latest Ref Range: 12.0 - 15.0 g/dL 11.2 (L) 11.1 (L) 10.9 (L) 10.9 (L)  HCT Latest Ref Range: 36.0 - 46.0 % 33.7 (L) 34.1 (L) 32.0 (L) 32.0 (L)  MCV Latest Ref Range: 80.0 - 100.0 fL 75.1 (L) 74.5 (L)    MCH Latest Ref Range: 26.0 - 34.0 pg 24.9 (L) 24.2 (L)    MCHC Latest Ref Range: 30.0 - 36.0 g/dL 33.2 32.6    RDW Latest Ref  Range: 11.5 - 15.5 % 14.6 14.3    Platelets Latest Ref Range: 150 - 400 K/uL 331 396    nRBC Latest Ref Range: 0.0 - 0.2 % 0.0 0.0      Review  of Systems  Constitution: Negative for decreased appetite, malaise/fatigue, weight gain and weight loss.  HENT: Negative for congestion.   Eyes: Negative for visual disturbance.  Cardiovascular: Positive for dyspnea on exertion. Negative for chest pain, leg swelling, palpitations and syncope.  Respiratory: Positive for cough and shortness of breath.   Endocrine: Negative for cold intolerance.  Hematologic/Lymphatic: Does not bruise/bleed easily.  Skin: Negative for itching and rash.  Musculoskeletal: Negative for myalgias.  Gastrointestinal: Negative for abdominal pain, nausea and vomiting.  Genitourinary: Negative for dysuria.  Neurological: Negative for dizziness and weakness.  Psychiatric/Behavioral: The patient is not nervous/anxious.   All other systems reviewed and are negative.        Vitals:   12/16/18 1121  BP: (!) 170/110  Pulse: (!) 108  Temp: (!) 97 F (36.1 C)    Physical Exam  Constitutional: She is oriented to person, place, and time. She appears well-developed and well-nourished. No distress.  HENT:  Head: Normocephalic and atraumatic.  Mild malar rash  Eyes: Pupils are equal, round, and reactive to light. Conjunctivae are normal.  Neck: No JVD present.  Cardiovascular: Normal rate, regular rhythm and intact distal pulses.  Pulmonary/Chest: Effort normal. She has no wheezes. She has no rales.  Abdominal: Soft. Bowel sounds are normal. There is no rebound.  Musculoskeletal:        General: No edema.  Lymphadenopathy:    She has no cervical adenopathy.  Neurological: She is alert and oriented to person, place, and time. No cranial nerve deficit.  Skin: Skin is warm and dry.  Psychiatric: She has a normal mood and affect.  Nursing note and vitals reviewed.         Assessment & Recommendations:   43 year old female with long-standing SLE, hypertension, type 2 diabetes mellitus, CAD s/p pLAD PCI for critical stenosis (12/12/2018), paroxysmal Afib  w/RVR, moderate aortic stenosis, stable pericardial effusion without tamponade or constriction (Cath 12/2018), interstitial lung disease, here for transition of care follow up.  CAD: S/p pLAD PCI. Recommend Aspirin/plavix/and eliquis for now. Stop Aspirin and continue eliquis and plavix starting 01/2019. Continue crestor 20 mg.   Paroxysmal Afib: Episodes of RVR last week. Currently in sinus tachycardia. Continue metoprolol tartarate 50 mg bid. Increase diltiazem to 240 mg daily.  CHA2DS2VASc score 3. On eliquis  Mg bid  Hypertension: Uncontrolled. Added losartan 50 mg daily. Check BMP in 2 weeks.   Pericardial effusion: Stable without tamponade or constriction (Cath 12/2018). Likely related to lupus. Continue colchicine, dose resuded to 0.6 mg daily.  Moderate aortic stenosis: Aortic stenosis of trileaflet valve.Continue serial monitoring with echocardiogram. Repeat echocardiogram in 05/2019.  Hypertension: Controlled.   Type 2 DM: Continue current management per PCP, along with Jardiance.  Lupus: Follow up with rheumatology.  Virtual visit follow up in 4 weeks.  [name], MD [facility] Pager: [phone] Office: [phone] If no answer Cell [phone]

## 2018-12-16 NOTE — Telephone Encounter (Signed)
Transition Care Management Follow-up Telephone Call #2  Date of discharge and from where: 12/12/2018, [facility]..  Call placed to patient # [phone], message left requesting call back to this CM # [phone].

## 2018-12-17 ENCOUNTER — Telehealth (HOSPITAL_COMMUNITY): Payer: Self-pay | Admitting: *Deleted

## 2018-12-17 NOTE — Telephone Encounter (Signed)
Called and left message for pt in regards to general well being and  Cardiac Rehab continued closure due to adherence of national recommendation for Covid-19 in group setting. Requested a call back. Contact information provided. Cherre Huger, BSN Cardiac and Training and development officer

## 2018-12-22 ENCOUNTER — Inpatient Hospital Stay: Payer: Medicaid Other | Admitting: Internal Medicine

## 2018-12-24 ENCOUNTER — Ambulatory Visit (INDEPENDENT_AMBULATORY_CARE_PROVIDER_SITE_OTHER): Payer: 59 | Admitting: *Deleted

## 2018-12-24 VITALS — BP 136/72 | HR 62 | Ht 60.0 in | Wt 231.1 lb

## 2018-12-24 DIAGNOSIS — Z3042 Encounter for surveillance of injectable contraceptive: Secondary | ICD-10-CM | POA: Diagnosis not present

## 2018-12-24 MED ORDER — MEDROXYPROGESTERONE ACETATE 150 MG/ML IM SUSP
150.0000 mg | Freq: Once | INTRAMUSCULAR | Status: AC
  Administered 2018-12-24: 150 mg via INTRAMUSCULAR

## 2018-12-24 MED FILL — HYDROXYCHLOROQUINE SULFATE: 200 | 30 days supply | Qty: 60 | Fill #0

## 2018-12-24 MED FILL — azaTHIOprine 50 MG TABS: 50 | 30 days supply | Qty: 60 | Fill #0

## 2018-12-24 NOTE — Progress Notes (Signed)
[name] here for Depo-Provera  Injection.  Injection administered without complication. Patient will return in 3 months for next injection. Last pap 07/09/2018 was abnormal and colposcopy was recommended. Discussed with pt and she states she will schedule an appointment for colposcopy when she checks out.   Pt had elevated blood pressure initially of 147/106.  Pt states she was recently taken off of her blood pressure medications and placed on different medications following a hospital stay and she has not yet received these medications.  Advised pt to contact both her pharmacy and her PCP regarding these medications.  Repeat BP 136/72  Dolores Hoose, RN 12/24/2018  11:09 AM

## 2018-12-30 MED FILL — LOSARTAN POTASSIUM 50 MG TA: 50 | 30 days supply | Qty: 30 | Fill #0

## 2018-12-30 MED FILL — DILTIAZEM 24HR CD 240 MG CA: 240 | 30 days supply | Qty: 30 | Fill #0

## 2019-01-06 MED FILL — ELIQUIS 5 MG TABLET: 5 | 30 days supply | Qty: 60 | Fill #0

## 2019-01-06 MED FILL — METOPROLOL TARTRATE 50 MG T: 50 | 30 days supply | Qty: 60 | Fill #1

## 2019-01-06 MED FILL — CLOPIDOGREL 75 MG TABLET: 75 | 30 days supply | Qty: 30 | Fill #0

## 2019-01-06 MED FILL — ROSUVASTATIN CALCIUM 20 MG: 20 | 30 days supply | Qty: 30 | Fill #1

## 2019-01-06 MED FILL — FUROSEMIDE 40 MG TAB: 40 | 30 days supply | Qty: 60 | Fill #1

## 2019-01-08 ENCOUNTER — Ambulatory Visit: Payer: 59 | Admitting: Cardiology

## 2019-01-08 NOTE — Progress Notes (Deleted)
Subjective:   [name], female    DOB: 01/17/1976, 44 y.o.   MRN: [id number]   I connected with the patient on 01/08/2019 by a video enabled telemedicine application and verified that I am speaking with the correct person using two identifiers.     I discussed the limitations of evaluation and management by telemedicine and the availability of in person appointments. The patient expressed understanding and agreed to proceed.   This visit type was conducted due to national recommendations for restrictions regarding the COVID-19 Pandemic (e.g. social distancing).  This format is felt to be most appropriate for this patient at this time.  All issues noted in this document were discussed and addressed.  No physical exam was performed (except for noted visual exam findings with Tele health visits).  The patient has consented to conduct a Tele health visit and understands insurance will be billed.   Chief complaint:  Shortness of breath  HPI  43 year old female with long-standing SLE, hypertension, type 2 diabetes mellitus, CAD s/p pLAD PCI for critical stenosis (12/12/2018), paroxysmal Afib w/RVR, moderate aortic stenosis, stable pericardial effusion without tamponade or constriction (Cath 12/2018), interstitial lung disease  ***  Past Medical History:  Diagnosis Date  . Abnormal Pap smear of cervix    colpo, HPV  . Allergy   . Anemia   . Atrial fibrillation ([place])   . CHF (congestive heart failure) ([place])   . Depression    after losses  . Enlarged heart    managed by cardiology  . Fibroid   . Gestational diabetes   . Heart murmur   . Hypertension   . Infection    UTI  . Lupus ([place]) 1999     Past Surgical History:  Procedure Laterality Date  . A-FLUTTER ABLATION N/A 06/10/2018   Procedure: A-FLUTTER ABLATION;  Surgeon: Evans Lance, MD;  Location: [facility];  Service: Cardiovascular;  Laterality: N/A;  . CARDIAC CATHETERIZATION    . CORONARY STENT  INTERVENTION N/A 12/12/2018   Procedure: CORONARY STENT INTERVENTION;  Surgeon: [name], MD;  Location: [place] [facility];  Service: Cardiovascular;  Laterality: N/A;  lad   . RIGHT/LEFT HEART CATH AND CORONARY ANGIOGRAPHY N/A 03/14/2018   Procedure: RIGHT/LEFT HEART CATH AND CORONARY ANGIOGRAPHY;  Surgeon: [name], MD;  Location: [facility] [facility];  Service: Cardiovascular;  Laterality: N/A;  . RIGHT/LEFT HEART CATH AND CORONARY ANGIOGRAPHY N/A 12/12/2018   Procedure: RIGHT/LEFT HEART CATH AND CORONARY ANGIOGRAPHY;  Surgeon: [name], MD;  Location: [place] [facility];  Service: Cardiovascular;  Laterality: N/A;  . THERAPEUTIC ABORTION       Social History   Socioeconomic History  . Marital status: Married    Spouse name: Not on file  . Number of children: 2  . Years of education: Not on file  . Highest education level: Not on file  Occupational History  . Not on file  Social Needs  . Financial resource strain: Not on file  . Food insecurity:    Worry: Patient refused    Inability: Patient refused  . Transportation needs:    Medical: Patient refused    Non-medical: Patient refused  Tobacco Use  . Smoking status: Never Smoker  . Smokeless tobacco: Never Used  Substance and Sexual Activity  . Alcohol use: Yes    Comment: ocasionally   . Drug use: No  . Sexual activity: Yes    Birth control/protection: Injection  Lifestyle  .  Physical activity:    Days per week: Patient refused    Minutes per session: Patient refused  . Stress: Not on file  Relationships  . Social connections:    Talks on phone: Patient refused    Gets together: Patient refused    Attends religious service: Patient refused    Active member of club or organization: Patient refused    Attends meetings of clubs or organizations: Patient refused    Relationship status: Patient refused  . Intimate partner violence:    Fear of current or ex partner: Patient refused     Emotionally abused: Patient refused    Physically abused: Patient refused    Forced sexual activity: Patient refused  Other Topics Concern  . Not on file  Social History Narrative  . Not on file     Current Outpatient Medications on File Prior to Visit  Medication Sig Dispense Refill  . acetaminophen (TYLENOL) 325 MG tablet Take 2 tablets (650 mg total) by mouth every 4 (four) hours as needed for headache or mild pain.    Marland Kitchen acetaminophen (TYLENOL) 500 MG tablet Take 1,000 mg by mouth every 6 (six) hours as needed for headache (pain).    Marland Kitchen albuterol (PROVENTIL HFA;VENTOLIN HFA) 108 (90 Base) MCG/ACT inhaler Inhale 2 puffs into the lungs every 6 (six) hours as needed for wheezing or shortness of breath. 1 Inhaler 2  . apixaban (ELIQUIS) 5 MG TABS tablet Take 1 tablet (5 mg total) by mouth 2 (two) times daily. 60 tablet 2  . aspirin EC 81 MG tablet Take 81 mg by mouth daily.    Marland Kitchen azaTHIOprine (IMURAN) 50 MG tablet Take 50 mg by mouth 2 (two) times daily.    . benzonatate (TESSALON) 200 MG capsule Take 1 capsule (200 mg total) by mouth 3 (three) times daily as needed for cough. 30 capsule 0  . Blood Glucose Monitoring Suppl (TRUE METRIX METER) w/Device KIT Check blood sugars fasting and at bedtime 1 kit 0  . cetirizine (ZYRTEC) 10 MG tablet Take 1 tablet (10 mg total) by mouth daily. 90 tablet 1  . clopidogrel (PLAVIX) 75 MG tablet Take 1 tablet (75 mg total) by mouth daily with breakfast. 60 tablet 2  . colchicine 0.6 MG tablet Take 1 tablet (0.6 mg total) by mouth daily. 60 tablet 1  . diltiazem (CARDIZEM CD) 240 MG 24 hr capsule Take 1 capsule (240 mg total) by mouth daily. 90 capsule 2  . furosemide (LASIX) 40 MG tablet TAKE 1 TABLET (40 MG TOTAL) BY MOUTH 2 (TWO) TIMES DAILY. 60 tablet 2  . glucose blood (TRUE METRIX BLOOD GLUCOSE TEST) test strip Use as instructed 100 each 12  . hydroxychloroquine (PLAQUENIL) 200 MG tablet Take 1 tablet (200 mg total) by mouth 2 (two) times daily. 60  tablet 4  . insulin glargine (LANTUS) 100 UNIT/ML injection Inject 0.08 mLs (8 Units total) into the skin daily before breakfast. (Patient taking differently: Inject 16 Units into the skin daily before breakfast. ) 10 mL 3  . losartan (COZAAR) 50 MG tablet Take 1 tablet (50 mg total) by mouth daily. 30 tablet 3  . medroxyPROGESTERone (DEPO-PROVERA) 150 MG/ML injection Inject 150 mg into the muscle every 3 (three) months.    . metFORMIN (GLUCOPHAGE) 1000 MG tablet Take 1 tablet (1,000 mg total) by mouth 2 (two) times daily with a meal. 60 tablet 3  . metoprolol tartrate (LOPRESSOR) 50 MG tablet Take 1 tablet (50 mg total) by mouth 2 (  two) times daily. 90 tablet 3  . omeprazole (PRILOSEC) 20 MG capsule Take 1 capsule (20 mg total) by mouth daily as needed (for heartburn or GERD-like symptoms). (Patient taking differently: Take 20 mg by mouth daily. ) 30 capsule 3  . Phenyleph-CPM-DM-Aspirin (ALKA-SELTZER PLUS COLD & COUGH PO) Take 2 tablets by mouth every 4 (four) hours as needed (cough/congestion).    . potassium chloride SA (K-DUR) 20 MEQ tablet Take 2 tablets (40 mEq total) by mouth daily. 90 tablet 2  . predniSONE (DELTASONE) 20 MG tablet Take 1 tablet (20 mg total) by mouth daily with breakfast. 7 tablet 0  . rosuvastatin (CRESTOR) 20 MG tablet Take 1 tablet (20 mg total) by mouth daily at 6 PM. 30 tablet 4  . TRUEPLUS LANCETS 28G MISC Check blood sugars as directed 100 each 1   No current facility-administered medications on file prior to visit.     Cardiovascular studies:  EKG 12/16/2018: Sinus tachycardia 106 bpm Right axis deviation  Significant Diagnostic Studies:  CARDIAC STUDIES:  EKG 12/12/2018: Sinus rhythm, aberrant conducted PAC's  R/LHC, coronary angiography and intervention 12/12/2018: RA: 10 mmHg RV: 46/7 mmHg. RVEDP 8 mmHg PA: 48/22 mmHg. Mean PA 33 mmHg PW: 17 mmHg LV 155/7 mmHg. LVEDP 10 mmHg <10 mmHg LV-Ao pullback gradient CO 7.8 L/min. CI 4 L/min/m2   Simultaneous RV-LV pressure tracings do not show interdependence to suggest constriction physiology.  LM: Wall calcium with no significant stenosis LAD: Ostial-proximal 90% stenosis. Normal diagonal branches. Successful PTCA and stent placement pLAD Synergy DES 3.5X16 mm DES Post dilatation with 4.0 X 8 mm Montgomery balloon. 0% residual stenosis Ramus: Normal LCx: Normal RCA: Normal  Echocardiogram 12/10/2018: 1. The left ventricle has low normal systolic function, with an ejection fraction of 50-55%. No evidence of left ventricular regional wall motion abnormalities. 2. Moderate posteriorly located pericardial effusion with no signs of tamponade. 3. This is a limited study with no significant change compared to previous study on 11/20/2018.  Recent labs: Results for LAELIA, ANGELO (MRN [id number]) as of 12/16/2018 15:15  Ref. Range 12/11/2018 03:35 12/12/2018 03:27 12/12/2018 08:50 12/12/2018 27:78  BASIC METABOLIC PANEL Unknown Rpt (A) Rpt (A)    Sodium Latest Ref Range: 135 - 145 mmol/L 136 138 142 142  Potassium Latest Ref Range: 3.5 - 5.1 mmol/L 3.7 4.5 3.4 (L) 3.4 (L)  Chloride Latest Ref Range: 98 - 111 mmol/L 104 106    CO2 Latest Ref Range: 22 - 32 mmol/L 21 (L) 20 (L)    Glucose Latest Ref Range: 70 - 99 mg/dL 119 (H) 119 (H)    BUN Latest Ref Range: 6 - 20 mg/dL 12 13    Creatinine Latest Ref Range: 0.44 - 1.00 mg/dL 0.88 0.95    Calcium Latest Ref Range: 8.9 - 10.3 mg/dL 9.4 9.5    Anion gap Latest Ref Range: 5 - 15  11 12     Calcium Ionized Latest Ref Range: 1.15 - 1.40 mmol/L   1.22 1.21  GFR, Est Non African American Latest Ref Range: >60 mL/min >60 >60    GFR, Est African American Latest Ref Range: >60 mL/min >60 >60    Procalcitonin Latest Units: ng/mL <0.10      Results for TOMEKO, SCOVILLE (MRN [id number]) as of 12/16/2018 15:15  Ref. Range 12/10/2018 08:54 12/11/2018 09:41 12/12/2018 08:50 12/12/2018 08:51  WBC Latest Ref Range: 4.0 - 10.5 K/uL 5.9 5.5    RBC Latest Ref  Range: 3.87 - 5.11 MIL/uL 4.49  4.58    Hemoglobin Latest Ref Range: 12.0 - 15.0 g/dL 11.2 (L) 11.1 (L) 10.9 (L) 10.9 (L)  HCT Latest Ref Range: 36.0 - 46.0 % 33.7 (L) 34.1 (L) 32.0 (L) 32.0 (L)  MCV Latest Ref Range: 80.0 - 100.0 fL 75.1 (L) 74.5 (L)    MCH Latest Ref Range: 26.0 - 34.0 pg 24.9 (L) 24.2 (L)    MCHC Latest Ref Range: 30.0 - 36.0 g/dL 33.2 32.6    RDW Latest Ref Range: 11.5 - 15.5 % 14.6 14.3    Platelets Latest Ref Range: 150 - 400 K/uL 331 396    nRBC Latest Ref Range: 0.0 - 0.2 % 0.0 0.0      Review of Systems  Constitution: Negative for decreased appetite, malaise/fatigue, weight gain and weight loss.  HENT: Negative for congestion.   Eyes: Negative for visual disturbance.  Cardiovascular: Positive for dyspnea on exertion. Negative for chest pain, leg swelling, palpitations and syncope.  Respiratory: Positive for cough and shortness of breath.   Endocrine: Negative for cold intolerance.  Hematologic/Lymphatic: Does not bruise/bleed easily.  Skin: Negative for itching and rash.  Musculoskeletal: Negative for myalgias.  Gastrointestinal: Negative for abdominal pain, nausea and vomiting.  Genitourinary: Negative for dysuria.  Neurological: Negative for dizziness and weakness.  Psychiatric/Behavioral: The patient is not nervous/anxious.   All other systems reviewed and are negative.        There were no vitals filed for this visit.  Physical Exam  Constitutional: She is oriented to person, place, and time. She appears well-developed and well-nourished. No distress.  HENT:  Head: Normocephalic and atraumatic.  Mild malar rash  Eyes: Pupils are equal, round, and reactive to light. Conjunctivae are normal.  Neck: No JVD present.  Cardiovascular: Normal rate, regular rhythm and intact distal pulses.  Pulmonary/Chest: Effort normal. She has no wheezes. She has no rales.  Abdominal: Soft. Bowel sounds are normal. There is no rebound.  Musculoskeletal:         General: No edema.  Lymphadenopathy:    She has no cervical adenopathy.  Neurological: She is alert and oriented to person, place, and time. No cranial nerve deficit.  Skin: Skin is warm and dry.  Psychiatric: She has a normal mood and affect.  Nursing note and vitals reviewed.         Assessment & Recommendations:   43 year old female with long-standing SLE, hypertension, type 2 diabetes mellitus, CAD s/p pLAD PCI for critical stenosis (12/12/2018), paroxysmal Afib w/RVR, moderate aortic stenosis, stable pericardial effusion without tamponade or constriction (Cath 12/2018), interstitial lung disease, here for transition of care follow up.  *** CAD: S/p pLAD PCI. Recommend Aspirin/plavix/and eliquis for now. Stop Aspirin and continue eliquis and plavix starting 01/2019. Continue crestor 20 mg.  *** Paroxysmal Afib: Episodes of RVR last week. Currently in sinus tachycardia. Continue metoprolol tartarate 50 mg bid. Increase diltiazem to 240 mg daily.  CHA2DS2VASc score 3. On eliquis  Mg bid  *** Hypertension: Uncontrolled. Added losartan 50 mg daily. Check BMP in 2 weeks.   *** Pericardial effusion: Stable without tamponade or constriction (Cath 12/2018). Likely related to lupus. Continue colchicine, dose resuded to 0.6 mg daily.  *** Moderate aortic stenosis: Aortic stenosis of trileaflet valve.Continue serial monitoring with echocardiogram. Repeat echocardiogram in 05/2019.  *** Hypertension: Controlled.   *** Type 2 DM: Continue current management per PCP, along with Jardiance.  *** Lupus: Follow up with rheumatology.  Virtual visit follow up in 4 weeks.  Cheryl Stabenow J  Virgina Jock, MD [facility] [place] Pager: [phone] Office: [phone] If no answer Cell [phone]

## 2019-01-12 ENCOUNTER — Ambulatory Visit (INDEPENDENT_AMBULATORY_CARE_PROVIDER_SITE_OTHER): Payer: 59 | Admitting: Acute Care

## 2019-01-12 ENCOUNTER — Encounter: Payer: Self-pay | Admitting: Acute Care

## 2019-01-12 ENCOUNTER — Other Ambulatory Visit: Payer: Self-pay

## 2019-01-12 VITALS — BP 132/90 | HR 95 | Temp 98.1°F | Ht 60.0 in | Wt 233.2 lb

## 2019-01-12 DIAGNOSIS — I483 Typical atrial flutter: Secondary | ICD-10-CM | POA: Diagnosis not present

## 2019-01-12 DIAGNOSIS — J849 Interstitial pulmonary disease, unspecified: Secondary | ICD-10-CM | POA: Diagnosis not present

## 2019-01-12 DIAGNOSIS — I2510 Atherosclerotic heart disease of native coronary artery without angina pectoris: Secondary | ICD-10-CM | POA: Diagnosis not present

## 2019-01-12 DIAGNOSIS — E1165 Type 2 diabetes mellitus with hyperglycemia: Secondary | ICD-10-CM

## 2019-01-12 DIAGNOSIS — M329 Systemic lupus erythematosus, unspecified: Secondary | ICD-10-CM | POA: Diagnosis not present

## 2019-01-12 NOTE — Patient Instructions (Addendum)
It is good to see you today. We need to treat your ILD and Lupus agressively We will call [facility] Rheumatology and see which autoimmune labs they have drawn, as I do not want to repeat what has already been done.. We will schedule you for a 6 minute walk and Pulmonary Function tests. Our goal is to get you on the lowest possible dose  of prednisone to control your symptoms. Remain on 10 mg prednisone through this Thursday.  Then decrease to 7.5 mg x 7 days. ( 1 and a half 5 mg tablets) Then decrease to 5.0 mg daily until seen by our pulmonologist. Watch your blood sugars carefully while on prednisone. If you have any issues with this therapy please call the office. Follow up with Dr. Chase Caller at first available appointment  We will mail you  A pulmonary fibrosis questionnaire. Please complete prior to  your first visit. If your symptoms return, go up to the dosage that last was controlling your symptoms and call the office and let us know. Continue taking Lasix daily as prescribed. Continue Plaquenil as prescribed Continue Zyrtec and Prilosec. Please contact office for sooner follow up if symptoms do not improve or worsen or seek emergency care

## 2019-01-12 NOTE — Assessment & Plan Note (Signed)
Proximal LAD lesion previously seen to be moderate on coronary angiography in August 2019, appears to have progressed to a high-grade 90% lesion.   Intervention to this lesion with placement of  Synergy 3.5 x 16 mm drug-eluting stent with postdilatation with excellent results and no residual stenosis.   Patient did note improvement in shortness of breath symptoms after successful coronary revascularization.   Her resting tachycardia has also improved  Plan: She will be on Aspirin/plavix/Eliquis for 1 month. Then stop Aspirin, continue eliquis and plavix for 6 months. Per cards, if she needs  lung biopsy in the near future, plavix could potentially be stopped after 3 months use ie in 03/2019.

## 2019-01-12 NOTE — Assessment & Plan Note (Addendum)
Progressive ILD per HRCT 09/2018 [facility] aggressive management  Additional diagnosis of SLE Family history of sarcoid and lupus Sleeps with a feather pillow May need bronch/ biopsy/ anti fibrotic therapy Plan: We need to treat your ILD and Lupus agressively We will call [facility] Rheumatology and see which autoimmune labs they have drawn, as I do not want to repeat what has already been done.. We will schedule you for a 6 minute walk and Pulmonary Function tests. Continue prednisone , but we will start a taper. Our goal is to get you on the lowest possible dose  of prednisone to control your symptoms. Remain on 10 mg prednisone through this Thursday.  Then decrease to 7.5 mg x 7 days. ( 1 and a half 5 mg tablets) Then decrease to 5.0 mg daily until seen by our pulmonologist. Watch your blood sugars carefully while on prednisone. If you have any issues with this therapy please call the office. Follow up with Dr. Chase Caller at first available appointment  We will mail you  A pulmonary fibrosis questionnaire. Please complete prior to  your first visit. If your symptoms return, go up to the dosage that last was controlling your symptoms and call the office and let us know. Continue taking Lasix daily as prescribed. Continue Plaquenil as prescribed Continue Zyrtec and Prilosec. Please contact office for sooner follow up if symptoms do not improve or worsen or seek emergency care

## 2019-01-12 NOTE — Assessment & Plan Note (Signed)
Please take Eliquis as prescribed

## 2019-01-12 NOTE — Assessment & Plan Note (Signed)
Monitor blood sugars as you have been doing Please be aware that prednisone will increase your blood sugars Please let us know if you have any issues.

## 2019-01-12 NOTE — Assessment & Plan Note (Addendum)
Cardiac involvement ? Pulmonary involvement vs ILD flare as cause of SOB and dry cough Plan We need to treat your ILD and Lupus agressively We will call [facility] Rheumatology and see which autoimmune labs they have drawn, as I do not want to repeat what has already been done.. We will schedule you for a 6 minute walk and Pulmonary Function tests. Our goal is to get you on the lowest possible dose  of prednisone to control your symptoms. Remain on 10 mg prednisone through this Thursday.  Then decrease to 7.5 mg x 7 days. ( 1 and a half 5 mg tablets) Then decrease to 5.0 mg daily until seen by our pulmonologist. Watch your blood sugars carefully while on prednisone. If you have any issues with this therapy please call the office. Follow up with Dr. Chase Caller at first available appointment  If your symptoms return, go up to the dosage that last was controlling your symptoms and call the office and let us know. Continue taking Lasix daily as prescribed. Continue Plaquenil as prescribed Continue Zyrtec and Prilosec. Please contact office for sooner follow up if symptoms do not improve or worsen or seek emergency care

## 2019-01-12 NOTE — Progress Notes (Signed)
History of Present Illness [name] is a 43 y.o. female with  long-standing SLE, hypertension, type 2 diabetes mellitus, nonobstructive CAD, moderate aortic stenosis, chronic pericardial effusion without tampoande physiology (cath 03/2018), recurrent atrial flutter, now status post ablation 05/2018. She was seen as an inpatient by the Pulmonary Critical Care service.   Hospital Admission 12/08/2018-12/12/2018  01/12/2019 Hospital Follow up 12/08/2018-12/12/2018 Admission Pt. Presents for hospital follow up. She was hospitalized for  the above dates for A fib, Pericardial effusion, SLE, ILD , Dyspnea and cough. She was seen by PCCM as an inpatient. There was initial question of  Infectious etiology, including pneumonia, which was clinically excluded after discussion with pulmonology.  She did not have any fever, leukocytosis, procalcitonin was negative.  Regarding her tachycardia, d-dimer was mildly elevated, but CTA did not show PE.  Thyroid function was normal.   She did have a left and right heart cath on 12/12/2018 which  Showed proximal LAD lesion previously seen to be moderate on coronary angiography in August 2019, appears to have progressed to a high-grade 90% lesion.  She had  intervention to this lesion with placement of  Synergy 3.5 x 16 mm drug-eluting stent with postdilatation with excellent results and no residual stenosis.  Patient did note improvement in shortness of breath symptoms after successful coronary revascularization.  Her resting tachycardia has also improved She will be on Aspirin/plavix/Eliquis for 1 month. Then stop Aspirin, continue eliquis and plavix for 6 months.  Plan was for out patient  follow up of new ILD findings and possible additional testing. She has been on Plaquenil for her Lupus  per rheumatology for several years. She presents today stating her shortness of breath and cough are better.  She was placed on prednisone 20 mg daily x 1 week at discharge. When she came  off this she was short of breath again and her cough resumed. She was  then placed on  15 mg daily x 7 days, and now she is on 10 mg x 7 days,  by [facility]. She was seen by them after discharge. She has had additional labs and testing done and she will follow up there tomorrow.  Her cough and SOB again have improved once resuming prednisone therapy. She is compliant with her  Lasix daily ( 40 mg BID), She states he has very few secretions, and when she does cough them up, they are very clear. She denies any fever, swelling or chest pain, no hemoptysis.   HRCT done 09/2018 was suggestive of ILD. She has had worsening shortness of breath over the last year per her history. She is a very good historian, and she is knowledgeable about her lupus. She does sleep on a feather pillow.( I have asked her to stop doing this). She has no birds. There is carpeting in her home which she classifies as old.We reviewed jobs and medications that are associated with ILD, she had no obvious risks other than her underlying Lupus.She does have family history of connective tissue disorders on her mother's side of the family.  Her maternal grandmother had sarcoidosis. Her maternal cousin passed away from lupus at 65 years old. She is open to being followed by Dr. Chase Caller through the ILD clinic. She is very motivated to manage her disease.We discussed that we need to aggressively manage her SLE and her ILD. Per cards, if she needs  lung biopsy in the near future, plavix could potentially be stopped after 3 months use  ie in 03/2019.   Test Results: HRCT 09/2018 Spectrum of findings compatible with fibrotic interstitial lung disease with mild basilar predominance, with progression since 2011 chest CT. No frank honeycombing. Differential includes usual interstitial pneumonia (UIP) or fibrotic phase nonspecific interstitial pneumonia (NSIP). Findings are categorized as probable UIP per consensus guidelines:  Diagnosis of Idiopathic Pulmonary Fibrosis: An Official ATS/ERS/JRS/ALAT Clinical Practice Guideline. [place], Iss 5, [phone], Apr 13 2017. Moderate pericardial effusion. Three-vessel coronary atherosclerosis. Chronic stable bilateral axillary and mediastinal lymphadenopathy, most compatible with benign etiology.  Labs 08/26/2018 >> ds DNA Ab: > 2 IU/mL 08/26/2018 >>  C3 Complement 103 mg/ dL 08/26/2018 >> C4 Complement 24 mg/dL   EKG 12/12/2018: Sinus rhythm, aberrant conducted PAC's  R/LHC, coronary angiography and intervention 12/12/2018: RA: 10 mmHg RV: 46/7 mmHg. RVEDP 8 mmHg PA: 48/22 mmHg. Mean PA 33 mmHg PW: 17 mmHg LV 155/7 mmHg. LVEDP 10 mmHg <10 mmHg LV-Ao pullback gradient CO 7.8 L/min. CI 4 L/min/m2  Simultaneous RV-LV pressure tracings do not show interdependence to suggest constriction physiology.  LM: Wall calcium with no significant stenosis LAD: Ostial-proximal 90% stenosis. Normal diagonal branches. Successful PTCA and stent placement pLAD Synergy DES 3.5X16 mm DES Post dilatation with 4.0 X 8 mm [place] balloon. 0% residual stenosis Ramus: Normal LCx: Normal RCA: Normal  Echocardiogram 12/10/2018: 1. The left ventricle has low normal systolic function, with an ejection fraction of 50-55%. No evidence of left ventricular regional wall motion abnormalities. 2. Moderate posteriorly located pericardial effusion with no signs of tamponade. 3. This is a limited study with no significant change compared to previous study on 11/20/2018.  CBC Latest Ref Rng & Units 12/12/2018 12/12/2018 12/11/2018  WBC 4.0 - 10.5 K/uL - - 5.5  Hemoglobin 12.0 - 15.0 g/dL 10.9(L) 10.9(L) 11.1(L)  Hematocrit 36.0 - 46.0 % 32.0(L) 32.0(L) 34.1(L)  Platelets 150 - 400 K/uL - - 396    BMP Latest Ref Rng & Units 12/12/2018 12/12/2018 12/12/2018  Glucose 70 - 99 mg/dL - - 119(H)  BUN 6 - 20 mg/dL - - 13  Creatinine 0.44 - 1.00 mg/dL - - 0.95  BUN/Creat Ratio 9 -  23 - - -  Sodium 135 - 145 mmol/L 142 142 138  Potassium 3.5 - 5.1 mmol/L 3.4(L) 3.4(L) 4.5  Chloride 98 - 111 mmol/L - - 106  CO2 22 - 32 mmol/L - - 20(L)  Calcium 8.9 - 10.3 mg/dL - - 9.5    BNP    Component Value Date/Time   BNP 351.5 (H) 12/08/2018 1526    ProBNP    Component Value Date/Time   PROBNP 69.0 08/18/2008 1430    PFT No results found for: FEV1PRE, FEV1POST, FVCPRE, FVCPOST, TLC, DLCOUNC, PREFEV1FVCRT, PSTFEV1FVCRT  No results found.   Past medical hx Past Medical History:  Diagnosis Date  . Abnormal Pap smear of cervix    colpo, HPV  . Allergy   . Anemia   . Atrial fibrillation ([place])   . CHF (congestive heart failure) ([place])   . Depression    after losses  . Enlarged heart    managed by cardiology  . Fibroid   . Gestational diabetes   . Heart murmur   . Hypertension   . Infection    UTI  . Lupus ([place]) 1999     Social History   Tobacco Use  . Smoking status: Never Smoker  . Smokeless tobacco: Never Used  Substance Use Topics  . Alcohol use:  Yes    Comment: ocasionally   . Drug use: No    Ms.Whitmyer reports that she has never smoked. She has never used smokeless tobacco. She reports current alcohol use. She reports that she does not use drugs.  Tobacco Cessation: Never smoker  Past surgical hx, Family hx, Social hx all reviewed.  Current Outpatient Medications on File Prior to Visit  Medication Sig  . acetaminophen (TYLENOL) 325 MG tablet Take 2 tablets (650 mg total) by mouth every 4 (four) hours as needed for headache or mild pain.  Marland Kitchen acetaminophen (TYLENOL) 500 MG tablet Take 1,000 mg by mouth every 6 (six) hours as needed for headache (pain).  Marland Kitchen albuterol (PROVENTIL HFA;VENTOLIN HFA) 108 (90 Base) MCG/ACT inhaler Inhale 2 puffs into the lungs every 6 (six) hours as needed for wheezing or shortness of breath.  Marland Kitchen apixaban (ELIQUIS) 5 MG TABS tablet Take 1 tablet (5 mg total) by mouth 2 (two) times daily.  Marland Kitchen aspirin EC 81 MG tablet  Take 81 mg by mouth daily.  Marland Kitchen azaTHIOprine (IMURAN) 50 MG tablet Take 50 mg by mouth 2 (two) times daily.  . benzonatate (TESSALON) 200 MG capsule Take 1 capsule (200 mg total) by mouth 3 (three) times daily as needed for cough.  . Blood Glucose Monitoring Suppl (TRUE METRIX METER) w/Device KIT Check blood sugars fasting and at bedtime  . cetirizine (ZYRTEC) 10 MG tablet Take 1 tablet (10 mg total) by mouth daily.  . clopidogrel (PLAVIX) 75 MG tablet Take 1 tablet (75 mg total) by mouth daily with breakfast.  . colchicine 0.6 MG tablet Take 1 tablet (0.6 mg total) by mouth daily.  Marland Kitchen diltiazem (CARDIZEM CD) 240 MG 24 hr capsule Take 1 capsule (240 mg total) by mouth daily.  . furosemide (LASIX) 40 MG tablet TAKE 1 TABLET (40 MG TOTAL) BY MOUTH 2 (TWO) TIMES DAILY.  Marland Kitchen glucose blood (TRUE METRIX BLOOD GLUCOSE TEST) test strip Use as instructed  . hydroxychloroquine (PLAQUENIL) 200 MG tablet Take 1 tablet (200 mg total) by mouth 2 (two) times daily.  . insulin glargine (LANTUS) 100 UNIT/ML injection Inject 0.08 mLs (8 Units total) into the skin daily before breakfast. (Patient taking differently: Inject 16 Units into the skin daily before breakfast. )  . losartan (COZAAR) 50 MG tablet Take 1 tablet (50 mg total) by mouth daily.  . medroxyPROGESTERone (DEPO-PROVERA) 150 MG/ML injection Inject 150 mg into the muscle every 3 (three) months.  . metFORMIN (GLUCOPHAGE) 1000 MG tablet Take 1 tablet (1,000 mg total) by mouth 2 (two) times daily with a meal.  . metoprolol tartrate (LOPRESSOR) 50 MG tablet Take 1 tablet (50 mg total) by mouth 2 (two) times daily.  Marland Kitchen omeprazole (PRILOSEC) 20 MG capsule Take 1 capsule (20 mg total) by mouth daily as needed (for heartburn or GERD-like symptoms). (Patient taking differently: Take 20 mg by mouth daily. )  . Phenyleph-CPM-DM-Aspirin (ALKA-SELTZER PLUS COLD & COUGH PO) Take 2 tablets by mouth every 4 (four) hours as needed (cough/congestion).  . potassium chloride SA  (K-DUR) 20 MEQ tablet Take 2 tablets (40 mEq total) by mouth daily.  . predniSONE (DELTASONE) 20 MG tablet Take 1 tablet (20 mg total) by mouth daily with breakfast. (Patient taking differently: Take 10 mg by mouth daily with breakfast. )  . rosuvastatin (CRESTOR) 20 MG tablet Take 1 tablet (20 mg total) by mouth daily at 6 PM.  . TRUEPLUS LANCETS 28G MISC Check blood sugars as directed   No current  facility-administered medications on file prior to visit.      Allergies  Allergen Reactions  . Celery Oil Shortness Of Breath, Itching, Rash and Other (See Comments)    Bumps on tongue, also  . Peanut-Containing Drug Products Anaphylaxis, Shortness Of Breath, Itching, Rash and Other (See Comments)    Bumps on tongue, also  . Septra [Bactrim] Hives    Review Of Systems:  Constitutional:   No  weight loss, night sweats,  Fevers, chills, fatigue, or  lassitude.  HEENT:   No headaches,  Difficulty swallowing,  Tooth/dental problems, or  Sore throat,                No sneezing, itching, ear ache, nasal congestion, post nasal drip,   CV:  No chest pain,  Orthopnea, PND, swelling in lower extremities, anasarca, dizziness, palpitations, syncope.   GI  No heartburn, indigestion, abdominal pain, nausea, vomiting, diarrhea, change in bowel habits, loss of appetite, bloody stools.   Resp: + on occasion shortness of breath with exertion none  at rest.  No excess mucus, no productive cough,  No non-productive cough,  No coughing up of blood.  No change in color of mucus.  No wheezing.  No chest wall deformity  Skin: no rash or lesions.  GU: no dysuria, change in color of urine, no urgency or frequency.  No flank pain, no hematuria   MS:  No joint pain or swelling.  No decreased range of motion.  No back pain.  Psych:  No change in mood or affect. No depression or anxiety.  No memory loss.   Vital Signs BP 132/90 (BP Location: Left Arm, Cuff Size: Normal)   Pulse 95   Temp 98.1 F (36.7 C)  (Oral)   Ht 5' (1.524 m)   Wt 233 lb 3.2 oz (105.8 kg)   SpO2 100%   BMI 45.54 kg/m    Physical Exam:  General- No distress,  A&Ox3, pleasant and very engaged ENT: No sinus tenderness, TM clear, pale nasal mucosa, no oral exudate,no post nasal drip, no LAN Cardiac: S1, S2, regular rate and rhythm, no murmur Chest: No wheeze/+  Rales/ crackles per bases/ no dullness; no accessory muscle use, no nasal flaring, no sternal retractions Abd.: Soft Non-tender, ND, BS +, Body mass index is 45.54 kg/m. Ext: No clubbing cyanosis, edema Neuro:  normal strength, MAE x 4, A&O x 3, appropriate Skin: No rashes, or lesions, warm and dry Psych: normal mood and behavior   Assessment/Plan  ILD (interstitial lung disease) (HCC) Progressive ILD per HRCT 09/2018 [facility] aggressive management  Additional diagnosis of SLE Family history of sarcoid and lupus Sleeps with a feather pillow May need bronch/ biopsy/ anti fibrotic therapy Plan: We need to treat your ILD and Lupus agressively We will call [facility] and see which autoimmune labs they have drawn, as I do not want to repeat what has already been done.. We will schedule you for a 6 minute walk and Pulmonary Function tests. Continue prednisone , but we will start a taper. Our goal is to get you on the lowest possible dose  of prednisone to control your symptoms. Remain on 10 mg prednisone through this Thursday.  Then decrease to 7.5 mg x 7 days. ( 1 and a half 5 mg tablets) Then decrease to 5.0 mg daily until seen by our pulmonologist. Watch your blood sugars carefully while on prednisone. If you have any issues with this therapy please call the office. Follow up with  Dr. Chase Caller at first available appointment  We will mail you  A pulmonary fibrosis questionnaire. Please complete prior to  your first visit. If your symptoms return, go up to the dosage that last was controlling your symptoms and call the office and let us  know. Continue taking Lasix daily as prescribed. Continue Plaquenil as prescribed Continue Zyrtec and Prilosec. Please contact office for sooner follow up if symptoms do not improve or worsen or seek emergency care    Lupus [facility]) Cardiac involvement ? Pulmonary involvement vs ILD flare as cause of SOB and dry cough Plan We need to treat your ILD and Lupus agressively We will call [facility] Rheumatology and see which autoimmune labs they have drawn, as I do not want to repeat what has already been done.. We will schedule you for a 6 minute walk and Pulmonary Function tests. Our goal is to get you on the lowest possible dose  of prednisone to control your symptoms. Remain on 10 mg prednisone through this Thursday.  Then decrease to 7.5 mg x 7 days. ( 1 and a half 5 mg tablets) Then decrease to 5.0 mg daily until seen by our pulmonologist. Watch your blood sugars carefully while on prednisone. If you have any issues with this therapy please call the office. Follow up with Dr. Chase Caller at first available appointment  If your symptoms return, go up to the dosage that last was controlling your symptoms and call the office and let us know. Continue taking Lasix daily as prescribed. Continue Plaquenil as prescribed Continue Zyrtec and Prilosec. Please contact office for sooner follow up if symptoms do not improve or worsen or seek emergency care     Coronary artery disease Proximal LAD lesion previously seen to be moderate on coronary angiography in August 2019, appears to have progressed to a high-grade 90% lesion.   Intervention to this lesion with placement of  Synergy 3.5 x 16 mm drug-eluting stent with postdilatation with excellent results and no residual stenosis.   Patient did note improvement in shortness of breath symptoms after successful coronary revascularization.   Her resting tachycardia has also improved  Plan: She will be on Aspirin/plavix/Eliquis for 1 month. Then stop  Aspirin, continue eliquis and plavix for 6 months. Per cards, if she needs  lung biopsy in the near future, plavix could potentially be stopped after 3 months use ie in 03/2019.   Atrial flutter ([place]) Please take Eliquis as prescribed  Type 2 diabetes mellitus with hyperglycemia, without long-term current use of insulin (HCC) Monitor blood sugars as you have been doing Please be aware that prednisone will increase your blood sugars Please let us know if you have any issues.     Magdalen Spatz, NP 01/12/2019  6:52 PM

## 2019-01-14 ENCOUNTER — Other Ambulatory Visit: Payer: Self-pay

## 2019-01-14 ENCOUNTER — Encounter: Payer: Self-pay | Admitting: Cardiology

## 2019-01-15 ENCOUNTER — Encounter: Payer: Self-pay | Admitting: Cardiology

## 2019-01-15 ENCOUNTER — Other Ambulatory Visit: Payer: Self-pay

## 2019-01-15 ENCOUNTER — Ambulatory Visit (INDEPENDENT_AMBULATORY_CARE_PROVIDER_SITE_OTHER): Payer: 59 | Admitting: Cardiology

## 2019-01-15 VITALS — BP 132/70 | Ht 60.0 in | Wt 225.0 lb

## 2019-01-15 DIAGNOSIS — I2510 Atherosclerotic heart disease of native coronary artery without angina pectoris: Secondary | ICD-10-CM | POA: Diagnosis not present

## 2019-01-15 DIAGNOSIS — R0602 Shortness of breath: Secondary | ICD-10-CM | POA: Diagnosis not present

## 2019-01-15 DIAGNOSIS — I480 Paroxysmal atrial fibrillation: Secondary | ICD-10-CM

## 2019-01-15 DIAGNOSIS — I350 Nonrheumatic aortic (valve) stenosis: Secondary | ICD-10-CM

## 2019-01-15 NOTE — Progress Notes (Signed)
Subjective:   [name], female    DOB: [birth year], 43 y.o.   MRN: [id number]   I connected with the patient on 01/08/2019 by a video enabled telemedicine application and verified that I am speaking with the correct person using two identifiers.     I discussed the limitations of evaluation and management by telemedicine and the availability of in person appointments. The patient expressed understanding and agreed to proceed.   This visit type was conducted due to national recommendations for restrictions regarding the COVID-19 Pandemic (e.g. social distancing).  This format is felt to be most appropriate for this patient at this time.  All issues noted in this document were discussed and addressed.  No physical exam was performed (except for noted visual exam findings with Tele health visits).  The patient has consented to conduct a Tele health visit and understands insurance will be billed.   Chief complaint:  Shortness of breath  HPI  43 year old female with long-standing SLE, hypertension, type 2 diabetes mellitus, CAD s/p pLAD PCI for critical stenosis (12/12/2018), paroxysmal Afib w/RVR, moderate aortic stenosis, stable pericardial effusion without tamponade or constriction (Cath 12/2018), interstitial lung disease.  Since her last visit with me, she did have recurrent episodes of shortness of breath.  She was evaluated by pulmonology and given a tapering regimen of prednisone, with which her shortness of breath has improved.  Her dry cough has also improved while on prednisone.  She denies any chest pain, palpitations or leg edema. Patient will be undergoing 6-minute walk test followed by evaluation by Dr. Chase Caller for consideration for bronchoscopy, biopsy, and anti-fibrotic treatment for interstitial lung disease.   Past Medical History:  Diagnosis Date  . Abnormal Pap smear of cervix    colpo, HPV  . Allergy   . Anemia   . Atrial fibrillation ([place])   . CHF  (congestive heart failure) ([place])   . Depression    after losses  . Enlarged heart    managed by cardiology  . Fibroid   . Gestational diabetes   . Heart murmur   . Hypertension   . Infection    UTI  . Lupus ([place]) 1999     Past Surgical History:  Procedure Laterality Date  . A-FLUTTER ABLATION N/A 06/10/2018   Procedure: A-FLUTTER ABLATION;  Surgeon: Evans Lance, MD;  Location: [facility];  Service: Cardiovascular;  Laterality: N/A;  . CARDIAC CATHETERIZATION    . CORONARY STENT INTERVENTION N/A 12/12/2018   Procedure: CORONARY STENT INTERVENTION;  Surgeon: [name], MD;  Location: [facility];  Service: Cardiovascular;  Laterality: N/A;  lad   . RIGHT/LEFT HEART CATH AND CORONARY ANGIOGRAPHY N/A 03/14/2018   Procedure: RIGHT/LEFT HEART CATH AND CORONARY ANGIOGRAPHY;  Surgeon: [name], MD;  Location: [facility];  Service: Cardiovascular;  Laterality: N/A;  . RIGHT/LEFT HEART CATH AND CORONARY ANGIOGRAPHY N/A 12/12/2018   Procedure: RIGHT/LEFT HEART CATH AND CORONARY ANGIOGRAPHY;  Surgeon: [name], MD;  Location: [facility];  Service: Cardiovascular;  Laterality: N/A;  . THERAPEUTIC ABORTION       Social History   Socioeconomic History  . Marital status: Married    Spouse name: Not on file  . Number of children: 2  . Years of education: Not on file  . Highest education level: Not on file  Occupational History  . Not on file  Social Needs  . Financial resource strain: Not on file  .  Food insecurity:    Worry: Patient refused    Inability: Patient refused  . Transportation needs:    Medical: Patient refused    Non-medical: Patient refused  Tobacco Use  . Smoking status: Never Smoker  . Smokeless tobacco: Never Used  Substance and Sexual Activity  . Alcohol use: Yes    Comment: ocasionally   . Drug use: No  . Sexual activity: Yes    Birth control/protection: Injection  Lifestyle  . Physical  activity:    Days per week: Patient refused    Minutes per session: Patient refused  . Stress: Not on file  Relationships  . Social connections:    Talks on phone: Patient refused    Gets together: Patient refused    Attends religious service: Patient refused    Active member of club or organization: Patient refused    Attends meetings of clubs or organizations: Patient refused    Relationship status: Patient refused  . Intimate partner violence:    Fear of current or ex partner: Patient refused    Emotionally abused: Patient refused    Physically abused: Patient refused    Forced sexual activity: Patient refused  Other Topics Concern  . Not on file  Social History Narrative  . Not on file     Current Outpatient Medications on File Prior to Visit  Medication Sig Dispense Refill  . acetaminophen (TYLENOL) 500 MG tablet Take 1,000 mg by mouth every 6 (six) hours as needed for headache (pain).    Marland Kitchen albuterol (PROVENTIL HFA;VENTOLIN HFA) 108 (90 Base) MCG/ACT inhaler Inhale 2 puffs into the lungs every 6 (six) hours as needed for wheezing or shortness of breath. 1 Inhaler 2  . apixaban (ELIQUIS) 5 MG TABS tablet Take 1 tablet (5 mg total) by mouth 2 (two) times daily. 60 tablet 2  . aspirin EC 81 MG tablet Take 81 mg by mouth daily.    Marland Kitchen azaTHIOprine (IMURAN) 50 MG tablet Take 50 mg by mouth 2 (two) times daily.    . benzonatate (TESSALON) 200 MG capsule Take 1 capsule (200 mg total) by mouth 3 (three) times daily as needed for cough. 30 capsule 0  . Blood Glucose Monitoring Suppl (TRUE METRIX METER) w/Device KIT Check blood sugars fasting and at bedtime 1 kit 0  . cetirizine (ZYRTEC) 10 MG tablet Take 1 tablet (10 mg total) by mouth daily. 90 tablet 1  . clopidogrel (PLAVIX) 75 MG tablet Take 1 tablet (75 mg total) by mouth daily with breakfast. 60 tablet 2  . colchicine 0.6 MG tablet Take 1 tablet (0.6 mg total) by mouth daily. 60 tablet 1  . diltiazem (CARDIZEM CD) 240 MG 24 hr  capsule Take 1 capsule (240 mg total) by mouth daily. 90 capsule 2  . furosemide (LASIX) 40 MG tablet TAKE 1 TABLET (40 MG TOTAL) BY MOUTH 2 (TWO) TIMES DAILY. 60 tablet 2  . glucose blood (TRUE METRIX BLOOD GLUCOSE TEST) test strip Use as instructed 100 each 12  . hydroxychloroquine (PLAQUENIL) 200 MG tablet Take 1 tablet (200 mg total) by mouth 2 (two) times daily. 60 tablet 4  . insulin glargine (LANTUS) 100 UNIT/ML injection Inject 0.08 mLs (8 Units total) into the skin daily before breakfast. (Patient taking differently: Inject 16 Units into the skin daily before breakfast. ) 10 mL 3  . losartan (COZAAR) 50 MG tablet Take 1 tablet (50 mg total) by mouth daily. 30 tablet 3  . medroxyPROGESTERone (DEPO-PROVERA) 150 MG/ML injection Inject  150 mg into the muscle every 3 (three) months.    . metFORMIN (GLUCOPHAGE) 1000 MG tablet Take 1 tablet (1,000 mg total) by mouth 2 (two) times daily with a meal. 60 tablet 3  . metoprolol tartrate (LOPRESSOR) 50 MG tablet Take 1 tablet (50 mg total) by mouth 2 (two) times daily. 90 tablet 3  . omeprazole (PRILOSEC) 20 MG capsule Take 1 capsule (20 mg total) by mouth daily as needed (for heartburn or GERD-like symptoms). (Patient taking differently: Take 20 mg by mouth daily. ) 30 capsule 3  . Phenyleph-CPM-DM-Aspirin (ALKA-SELTZER PLUS COLD & COUGH PO) Take 2 tablets by mouth every 4 (four) hours as needed (cough/congestion).    . potassium chloride SA (K-DUR) 20 MEQ tablet Take 2 tablets (40 mEq total) by mouth daily. 90 tablet 2  . predniSONE (DELTASONE) 20 MG tablet Take 1 tablet (20 mg total) by mouth daily with breakfast. (Patient taking differently: Take 10 mg by mouth daily with breakfast. ) 7 tablet 0  . rosuvastatin (CRESTOR) 20 MG tablet Take 1 tablet (20 mg total) by mouth daily at 6 PM. 30 tablet 4  . TRUEPLUS LANCETS 28G MISC Check blood sugars as directed 100 each 1   No current facility-administered medications on file prior to visit.      Cardiovascular studies:  EKG 12/16/2018: Sinus tachycardia 106 bpm Right axis deviation  Significant Diagnostic Studies:  CARDIAC STUDIES:  EKG 12/12/2018: Sinus rhythm, aberrant conducted PAC's  R/LHC, coronary angiography and intervention 12/12/2018: RA: 10 mmHg RV: 46/7 mmHg. RVEDP 8 mmHg PA: 48/22 mmHg. Mean PA 33 mmHg PW: 17 mmHg LV 155/7 mmHg. LVEDP 10 mmHg <10 mmHg LV-Ao pullback gradient CO 7.8 L/min. CI 4 L/min/m2  Simultaneous RV-LV pressure tracings do not show interdependence to suggest constriction physiology.  LM: Wall calcium with no significant stenosis LAD: Ostial-proximal 90% stenosis. Normal diagonal branches. Successful PTCA and stent placement pLAD Synergy DES 3.5X16 mm DES Post dilatation with 4.0 X 8 mm Trinity balloon. 0% residual stenosis Ramus: Normal LCx: Normal RCA: Normal  Echocardiogram 12/10/2018: 1. The left ventricle has low normal systolic function, with an ejection fraction of 50-55%. No evidence of left ventricular regional wall motion abnormalities. 2. Moderate posteriorly located pericardial effusion with no signs of tamponade. 3. This is a limited study with no significant change compared to previous study on 11/20/2018.  Recent labs: Results for ELLAYNA, HILLIGOSS (MRN [id number]) as of 12/16/2018 15:15  Ref. Range 12/11/2018 03:35 12/12/2018 03:27 12/12/2018 08:50 12/12/2018 04:88  BASIC METABOLIC PANEL Unknown Rpt (A) Rpt (A)    Sodium Latest Ref Range: 135 - 145 mmol/L 136 138 142 142  Potassium Latest Ref Range: 3.5 - 5.1 mmol/L 3.7 4.5 3.4 (L) 3.4 (L)  Chloride Latest Ref Range: 98 - 111 mmol/L 104 106    CO2 Latest Ref Range: 22 - 32 mmol/L 21 (L) 20 (L)    Glucose Latest Ref Range: 70 - 99 mg/dL 119 (H) 119 (H)    BUN Latest Ref Range: 6 - 20 mg/dL 12 13    Creatinine Latest Ref Range: 0.44 - 1.00 mg/dL 0.88 0.95    Calcium Latest Ref Range: 8.9 - 10.3 mg/dL 9.4 9.5    Anion gap Latest Ref Range: 5 - _0 Calcium  Ionized Latest Ref Range: 1.15 - 1.40 mmol/L   1.22 1.21  GFR, Est Non African American Latest Ref Range: >60 mL/min >60 >60    GFR, Est African American  Latest Ref Range: >60 mL/min >60 >60    Procalcitonin Latest Units: ng/mL <0.10      Results for STERLING, UCCI (MRN [id number]) as of 12/16/2018 15:15  Ref. Range 12/10/2018 08:54 12/11/2018 09:41 12/12/2018 08:50 12/12/2018 08:51  WBC Latest Ref Range: 4.0 - 10.5 K/uL 5.9 5.5    RBC Latest Ref Range: 3.87 - 5.11 MIL/uL 4.49 4.58    Hemoglobin Latest Ref Range: 12.0 - 15.0 g/dL 11.2 (L) 11.1 (L) 10.9 (L) 10.9 (L)  HCT Latest Ref Range: 36.0 - 46.0 % 33.7 (L) 34.1 (L) 32.0 (L) 32.0 (L)  MCV Latest Ref Range: 80.0 - 100.0 fL 75.1 (L) 74.5 (L)    MCH Latest Ref Range: 26.0 - 34.0 pg 24.9 (L) 24.2 (L)    MCHC Latest Ref Range: 30.0 - 36.0 g/dL 33.2 32.6    RDW Latest Ref Range: 11.5 - 15.5 % 14.6 14.3    Platelets Latest Ref Range: 150 - 400 K/uL 331 396    nRBC Latest Ref Range: 0.0 - 0.2 % 0.0 0.0      Review of Systems  Constitution: Negative for decreased appetite, malaise/fatigue, weight gain and weight loss.  HENT: Negative for congestion.   Eyes: Negative for visual disturbance.  Cardiovascular: Positive for dyspnea on exertion. Negative for chest pain, leg swelling, palpitations and syncope.  Respiratory: Positive for cough and shortness of breath.   Endocrine: Negative for cold intolerance.  Hematologic/Lymphatic: Does not bruise/bleed easily.  Skin: Negative for itching and rash.  Musculoskeletal: Negative for myalgias.  Gastrointestinal: Negative for abdominal pain, nausea and vomiting.  Genitourinary: Negative for dysuria.  Neurological: Negative for dizziness and weakness.  Psychiatric/Behavioral: The patient is not nervous/anxious.   All other systems reviewed and are negative.        Vitals:   01/13/19 1014  BP: 132/70    Physical Exam  Constitutional: She is oriented to person, place, and time. She appears  well-developed and well-nourished. No distress.  Pulmonary/Chest: Effort normal.  Neurological: She is alert and oriented to person, place, and time.  Psychiatric: She has a normal mood and affect.  Nursing note and vitals reviewed.         Assessment & Recommendations:   43 year old female with long-standing SLE, hypertension, type 2 diabetes mellitus, CAD s/p pLAD PCI for critical stenosis (12/12/2018), paroxysmal Afib w/RVR, moderate aortic stenosis, stable pericardial effusion without tamponade or constriction (Cath 12/2018), interstitial lung disease, here for transition of care follow up.  Shortness of breath: Most likely due to interstitial lung disease.  Continue follow-up with pulmonology with plans for upcoming 6-minute walk test, and possible biopsy.  With regards to her antiplatelet/anticoagulation regimen, please see below.  CAD: S/p pLAD PCI. Recommend Aspirin/plavix/and eliquis for now. Stopped Aspirin. Continue eliquis and plavix. Biopsy may be needed soon for diagnosis and management of interstitial lung disease.  Given the time sensitive nature of tests, we may need to hold her Plavix sooner than usual.  I recommend the following. Stop Plavix 5 days before bronchoscopy, stop Eliquis 2 days before bronchoscopy. Restart aspirin 5 days before bronchoscopy and continue periprocedural.,  If possible. Resume Eliquis and Plavix, when deemed safe after the bronchoscopy, and stop aspirin. Although she has small risk of stent thrombosis with interruption of antiplatelet therapy, she has large size stent with good expansion and apposition.  Benefits of biopsy likely outweigh risk of stent thrombosis. Continue crestor 20 mg.  Paroxysmal Afib: Continue metoprolol tartarate 50 mg bid, diltiazem to 240  mg daily.  CHA2DS2VASc score 3. On eliquis 5 mg bid  Hypertension: Well controlled on current therapy.  Pericardial effusion: Stable without tamponade or constriction (Cath  12/2018). Likely related to lupus. Continue colchicine, dose resuded to 0.6 mg daily.She is also on prednisone and azathioprine.  Moderate aortic stenosis: Aortic stenosis of trileaflet valve.Continue serial monitoring with echocardiogram. Repeat echocardiogram in 05/2019.  Type 2 DM: Continue current management per PCP, along with Jardiance.  Lupus: Follow up with rheumatology.  Virtual visit follow up in 4 weeks.  [name], MD [facility] [place] Pager: [phone] Office: [phone] If no answer Cell [phone]

## 2019-01-19 MED FILL — traMADol HCL 50 MG TABS: 50 | 7 days supply | Qty: 14 | Fill #0

## 2019-01-20 ENCOUNTER — Telehealth: Payer: Self-pay | Admitting: Obstetrics & Gynecology

## 2019-01-20 NOTE — Telephone Encounter (Signed)
Attempted to call patient. Left a VM message about COVID.

## 2019-01-22 ENCOUNTER — Ambulatory Visit: Payer: Medicaid Other | Admitting: Obstetrics & Gynecology

## 2019-01-27 ENCOUNTER — Other Ambulatory Visit: Payer: Self-pay | Admitting: Pharmacist

## 2019-01-27 MED ORDER — BASAGLAR KWIKPEN 100 UNIT/ML ~~LOC~~ SOPN: 8.0000 [IU] | PEN_INJECTOR | Freq: Every day | SUBCUTANEOUS | 0 refills | Status: DC

## 2019-01-27 MED FILL — DILTIAZEM 24HR CD 240 MG CA: 240 | 30 days supply | Qty: 30 | Fill #1

## 2019-01-27 MED FILL — azaTHIOprine 50 MG TABS: 50 | 30 days supply | Qty: 60 | Fill #1

## 2019-01-27 MED FILL — LOSARTAN POTASSIUM 50 MG TA: 50 | 30 days supply | Qty: 30 | Fill #1

## 2019-01-27 MED FILL — HYDROXYCHLOROQUINE SULFATE: 200 | 30 days supply | Qty: 60 | Fill #1

## 2019-01-27 MED FILL — BASAGLAR 100 UNIT/ML KWIKPE: 100 | 34 days supply | Qty: 3 | Fill #0

## 2019-01-27 NOTE — Progress Notes (Signed)
Change to [employer] per [employer] preference.

## 2019-01-29 MED FILL — traMADol HCL 50 MG TABS: 50 | 7 days supply | Qty: 14 | Fill #1

## 2019-02-05 ENCOUNTER — Telehealth: Payer: Self-pay | Admitting: Family Medicine

## 2019-02-05 NOTE — Telephone Encounter (Signed)
Attempted to call patient to ask her about any symptoms, and to wear her mask the whole visit covering her nose and mouth, and no visitors.  Also, to sanitize her hands upon arriving. A message was left on her voicemail.  °

## 2019-02-06 ENCOUNTER — Encounter: Payer: Self-pay | Admitting: Family Medicine

## 2019-02-06 ENCOUNTER — Ambulatory Visit: Payer: Medicaid Other | Admitting: Family Medicine

## 2019-02-06 NOTE — Progress Notes (Signed)
Patient did not keep appointment today. She will be called to reschedule.  

## 2019-02-09 ENCOUNTER — Other Ambulatory Visit: Payer: Self-pay

## 2019-02-09 DIAGNOSIS — E1165 Type 2 diabetes mellitus with hyperglycemia: Secondary | ICD-10-CM

## 2019-02-09 MED ORDER — ONETOUCH DELICA LANCETS 33G MISC: 12 refills | Status: DC

## 2019-02-09 MED ORDER — ONETOUCH VERIO VI STRP: ORAL_STRIP | 12 refills | Status: DC

## 2019-02-09 MED ORDER — ONETOUCH VERIO W/DEVICE KIT: PACK | 0 refills | Status: DC

## 2019-02-09 MED ORDER — TRUEPLUS PEN NEEDLES 32G X 4 MM MISC: 3 refills | Status: DC

## 2019-02-09 MED FILL — traMADol HCL 50 MG TABS: 50 | 7 days supply | Qty: 14 | Fill #2

## 2019-02-09 MED FILL — ROSUVASTATIN CALCIUM 20 MG: 20 | 30 days supply | Qty: 30 | Fill #2

## 2019-02-09 MED FILL — FUROSEMIDE 40 MG TAB: 40 | 30 days supply | Qty: 60 | Fill #2

## 2019-02-09 MED FILL — ELIQUIS 5 MG TABLET: 5 | 30 days supply | Qty: 60 | Fill #1

## 2019-02-09 MED FILL — CLOPIDOGREL 75 MG TABLET: 75 | 30 days supply | Qty: 30 | Fill #1

## 2019-02-10 ENCOUNTER — Other Ambulatory Visit: Payer: Self-pay

## 2019-02-10 DIAGNOSIS — E1165 Type 2 diabetes mellitus with hyperglycemia: Secondary | ICD-10-CM

## 2019-02-10 MED ORDER — ACCU-CHEK GUIDE W/DEVICE KIT: 1.0000 | PACK | Freq: Three times a day (TID) | 0 refills | Status: DC

## 2019-02-10 MED ORDER — ACCU-CHEK GUIDE VI STRP: ORAL_STRIP | 12 refills | Status: DC

## 2019-02-10 MED ORDER — ACCU-CHEK FASTCLIX LANCETS MISC: 12 refills | Status: DC

## 2019-02-10 MED FILL — ACCU-CHEK FASTCLIX LANCETS: 30 days supply | Qty: 102 | Fill #0

## 2019-02-10 MED FILL — ACCU-CHEK GUIDE W/DEVICE KI: W/DEVICE | 1 days supply | Qty: 1 | Fill #0

## 2019-02-10 MED FILL — ACCU-CHEK GUIDE TEST STRIP: 30 days supply | Qty: 100 | Fill #0

## 2019-02-10 MED FILL — BD ULTRA-FINE PEN NDL 5MMX3: 31G X 5 MM | 30 days supply | Qty: 100 | Fill #0

## 2019-02-11 ENCOUNTER — Telehealth: Payer: Self-pay

## 2019-02-11 NOTE — Telephone Encounter (Signed)
Pt called stating that she is having some itching and discomfort.  LM pt that we are returning her call and if she continues to have questions or concerns to please give the office a call back or send [place] a MyChart message.

## 2019-02-16 MED FILL — METOPROLOL TARTRATE 50 MG T: 50 | 30 days supply | Qty: 60 | Fill #2

## 2019-02-16 MED FILL — traMADol HCL 50 MG TABS: 50 | 7 days supply | Qty: 14 | Fill #3

## 2019-02-17 ENCOUNTER — Telehealth: Payer: Self-pay | Admitting: General Practice

## 2019-02-17 DIAGNOSIS — N898 Other specified noninflammatory disorders of vagina: Secondary | ICD-10-CM

## 2019-02-17 NOTE — Telephone Encounter (Signed)
Patient called and left message on nurse voicemail line stating she is having some discomfort and is requesting a call back from a nurse.

## 2019-02-18 MED ORDER — FLUCONAZOLE 150 MG PO TABS: 150.0000 mg | ORAL_TABLET | Freq: Once | ORAL | 0 refills | Status: AC

## 2019-02-18 NOTE — Telephone Encounter (Signed)
Called pt back in regards to her questions. Pt believes she has a yeast infection. Pt reports itching and discomfort to her vagina and reports she tried OTC treatment and it did not work. She is not seeing discharge at this time.   Pt reports she has been on Prednisone for Lupus Flareup and missed her Colposcopy recently.   Pt reports she has had a yeast infection in the past and this feels the same. Discussed with pt that if Diflucan does not clear it up she needs to call back and make an appt for a swab next week. Pt voiced understanding.   Pt asked for med to be sent to [facility] on [address].

## 2019-02-19 ENCOUNTER — Ambulatory Visit: Payer: Self-pay | Admitting: Cardiology

## 2019-02-20 MED FILL — metFORMIN HCL 1000 MG TABS: 1000 | 30 days supply | Qty: 60 | Fill #2

## 2019-02-26 MED FILL — DILTIAZEM 24HR CD 240 MG CA: 240 | 30 days supply | Qty: 30 | Fill #2

## 2019-02-26 MED FILL — traMADol HCL 50 MG TABS: 50 | 7 days supply | Qty: 14 | Fill #4

## 2019-02-26 MED FILL — LOSARTAN POTASSIUM 50 MG TA: 50 | 30 days supply | Qty: 30 | Fill #2

## 2019-02-26 MED FILL — HYDROXYCHLOROQUINE SULFATE: 200 | 30 days supply | Qty: 60 | Fill #2

## 2019-03-03 ENCOUNTER — Telehealth: Payer: Self-pay | Admitting: Acute Care

## 2019-03-03 NOTE — Telephone Encounter (Signed)
lmtcb for pt.  

## 2019-03-03 NOTE — Telephone Encounter (Signed)
Left message for patient to call back  

## 2019-03-03 NOTE — Telephone Encounter (Signed)
Pt is calling back [phone]

## 2019-03-04 MED ORDER — PREDNISONE 5 MG PO TABS: 5.0000 mg | ORAL_TABLET | Freq: Every day | ORAL | 2 refills | Status: DC

## 2019-03-04 MED ORDER — PREDNISONE 5 MG PO TABS: 5.0000 mg | ORAL_TABLET | Freq: Every day | ORAL | 0 refills | Status: DC

## 2019-03-04 NOTE — Telephone Encounter (Signed)
Instructions from OV with SG 01/12/2019    Return in about 4 weeks (around 02/09/2019), or if symptoms worsen or fail to improve. It is good to see you today. We need to treat your ILD and Lupus agressively We will call [facility] and see which autoimmune labs they have drawn, as I do not want to repeat what has already been done.. We will schedule you for a 6 minute walk and Pulmonary Function tests. Our goal is to get you on the lowest possible dose  of prednisone to control your symptoms. Remain on 10 mg prednisone through this Thursday.  Then decrease to 7.5 mg x 7 days. ( 1 and a half 5 mg tablets) Then decrease to 5.0 mg daily until seen by our pulmonologist. Watch your blood sugars carefully while on prednisone. If you have any issues with this therapy please call the office. Follow up with Dr. Chase Caller at first available appointment  We will mail you  A pulmonary fibrosis questionnaire. Please complete prior to  your first visit. If your symptoms return, go up to the dosage that last was controlling your symptoms and call the office and let us know. Continue taking Lasix daily as prescribed. Continue Plaquenil as prescribed Continue Zyrtec and Prilosec. Please contact office for sooner follow up if symptoms do not improve or worsen or seek emergency care      Called and spoke with pt. Pt stated she was needing another Rx of prednisone to be sent to pharmacy. Reviewed OV pt had with SG 6/1 and SG did state for pt to remain on 5mg  prednisone until seen back at office. Pt is scheduled for PFT 9/29 but neither SG or MR has a schedule avail for that time yet. Stated to pt that I will send Rx for 5mg  prednisone to pharmacy for her and place some refills on there too so she could have enough to be able to get her through until next appt. Pt verbalized understanding. Nothing further needed.

## 2019-03-04 NOTE — Telephone Encounter (Signed)
Patient is returning phone call.  Patient phone number is [phone].

## 2019-03-04 NOTE — Telephone Encounter (Signed)
Per SG, pt needs to be reassessed sooner than September so we can know exactly how much prednisone she will need to be on if 5mg  will be okay or if she may be okay to titrate down to a lower dose. SG said to cancel the Rx that was sent to pharmacy and send in just enough to get her through until appt and to see if she can come in Monday, 7/27 for an appt.  Called and spoke with pt stating all this to her and an appt was scheduled for pt to see SG Monday, 7/27 at 9:30. Also made pt aware that we were going to cancel the original Rx that was sent in for the prednisone and that we were going to send enough just to get her through until her appt with SG Monday and pt verbalized understanding.   Called pharmacy and cancelled the Rx that was sent in and have sent in another Rx for #6tabs to get pt through until her appt. Nothing further needed.

## 2019-03-04 NOTE — Addendum Note (Signed)
Addended by: Lorretta Harp on: 03/04/2019 12:35 PM   Modules accepted: Orders

## 2019-03-05 ENCOUNTER — Ambulatory Visit: Payer: Self-pay | Attending: Nurse Practitioner | Admitting: Physician Assistant

## 2019-03-05 ENCOUNTER — Ambulatory Visit (HOSPITAL_COMMUNITY)
Admission: RE | Admit: 2019-03-05 | Discharge: 2019-03-05 | Disposition: A | Discharge status: Home / Self Care | Payer: Medicaid Other | Source: Ambulatory Visit | Attending: Physician Assistant | Admitting: Physician Assistant

## 2019-03-05 ENCOUNTER — Other Ambulatory Visit: Payer: Self-pay

## 2019-03-05 VITALS — BP 165/124 | HR 109 | Temp 93.0°F | Resp 16 | Wt 235.2 lb

## 2019-03-05 DIAGNOSIS — E1165 Type 2 diabetes mellitus with hyperglycemia: Secondary | ICD-10-CM

## 2019-03-05 DIAGNOSIS — R109 Unspecified abdominal pain: Secondary | ICD-10-CM | POA: Insufficient documentation

## 2019-03-05 DIAGNOSIS — I10 Essential (primary) hypertension: Secondary | ICD-10-CM

## 2019-03-05 DIAGNOSIS — R1031 Right lower quadrant pain: Secondary | ICD-10-CM

## 2019-03-05 LAB — POCT URINALYSIS DIP (CLINITEK)
Bilirubin, UA: NEGATIVE
Glucose, UA: NEGATIVE mg/dL
Ketones, POC UA: NEGATIVE mg/dL
Leukocytes, UA: NEGATIVE
Nitrite, UA: NEGATIVE
POC PROTEIN,UA: NEGATIVE
Spec Grav, UA: 1.01 (ref 1.010–1.025)
Urobilinogen, UA: 0.2 E.U./dL
pH, UA: 7 (ref 5.0–8.0)

## 2019-03-05 LAB — GLUCOSE, POCT (MANUAL RESULT ENTRY): POC Glucose: 211 mg/dl — AB (ref 70–99)

## 2019-03-05 LAB — POCT URINE PREGNANCY: Preg Test, Ur: NEGATIVE

## 2019-03-05 MED ORDER — METHOCARBAMOL 500 MG PO TABS: 1000.0000 mg | ORAL_TABLET | Freq: Three times a day (TID) | ORAL | 0 refills | Status: DC | PRN

## 2019-03-05 MED FILL — METHOCARBAMOL 500 MG TABS: 500 | 15 days supply | Qty: 90 | Fill #0

## 2019-03-05 NOTE — Patient Instructions (Signed)
Drink 80-100 ounces water daily 

## 2019-03-05 NOTE — Progress Notes (Signed)
Patient ID: [name], female   DOB: 02-24-1976, 43 y.o.   MRN: [id number]    [name], is a 43 y.o. female  JIR:[id number]  [id number]:[id number]  DOB - Jul 25, 1976  Subjective:  Chief Complaint and HPI: [name] is a 43 y.o. female here today for R back/flank pain X 2 weeks.  Pain is constant-waxes and wanes in intensity.  Aches.  No fever.  No urinary s/sx.  No vaginal discharge or pelvic pain.  Tylenol and tramadol help some.  Movement seems to exacerbate the pain.    Pulmonology just restarted her on prednisone a day or 2 ago for ILD flare.  Blood sugars run 200-300 when on prednisone(which she has been on a lot lately).  When not on prednisone, blood sugars about 150.    LMP ~1 week ago.  On depo provera.    ROS:   Constitutional:  No f/c, No night sweats, No unexplained weight loss. EENT:  No vision changes, No blurry vision, No hearing changes. No mouth, throat, or ear problems.  Respiratory: No cough, No SOB Cardiac: No CP, no palpitations GI:  No abd pain, No N/V/D. GU: No Urinary s/sx Musculoskeletal: No joint pain Neuro: No headache, no dizziness, no motor weakness.  Skin: No rash Endocrine:  No polydipsia. No polyuria.  Psych: Denies SI/HI  No problems updated.  ALLERGIES: Allergies  Allergen Reactions  . Celery Oil Shortness Of Breath, Itching, Rash and Other (See Comments)    Bumps on tongue, also  . Peanut-Containing Drug Products Anaphylaxis, Shortness Of Breath, Itching, Rash and Other (See Comments)    Bumps on tongue, also  . Septra [Bactrim] Hives    PAST MEDICAL HISTORY: Past Medical History:  Diagnosis Date  . Abnormal Pap smear of cervix    colpo, HPV  . Allergy   . Anemia   . Atrial fibrillation ([place])   . CHF (congestive heart failure) ([place])   . Depression    after losses  . Enlarged heart    managed by cardiology  . Fibroid   . Gestational diabetes   . Heart murmur   . Hypertension   . Infection    UTI  . Lupus ([place])  1999    MEDICATIONS AT HOME: Prior to Admission medications   Medication Sig Start Date End Date Taking? Authorizing Provider  Accu-Chek FastClix Lancets MISC Use as directed three times daily 02/10/19   Gildardo Pounds, NP  acetaminophen (TYLENOL) 500 MG tablet Take 1,000 mg by mouth every 6 (six) hours as needed for headache (pain).    [provider]  albuterol (PROVENTIL HFA;VENTOLIN HFA) 108 (90 Base) MCG/ACT inhaler Inhale 2 puffs into the lungs every 6 (six) hours as needed for wheezing or shortness of breath. 11/17/18   Gildardo Pounds, NP  apixaban (ELIQUIS) 5 MG TABS tablet Take 1 tablet (5 mg total) by mouth 2 (two) times daily. 12/12/18   Patwardhan, Reynold Bowen, MD  aspirin EC 81 MG tablet Take 81 mg by mouth daily.    [provider]  azaTHIOprine (IMURAN) 50 MG tablet Take 50 mg by mouth 2 (two) times daily.    [provider]  benzonatate (TESSALON) 200 MG capsule Take 1 capsule (200 mg total) by mouth 3 (three) times daily as needed for cough. 08/27/18   Mikhail, Velta Addison, DO  Blood Glucose Monitoring Suppl (ACCU-CHEK GUIDE) w/Device KIT 1 each by Does not apply route 3 (three) times daily. 02/10/19   Gildardo Pounds, NP  cetirizine (ZYRTEC) 10 MG tablet Take 1 tablet (10 mg total) by mouth daily. 11/17/18 02/15/19  Gildardo Pounds, NP  clopidogrel (PLAVIX) 75 MG tablet Take 1 tablet (75 mg total) by mouth daily with breakfast. 12/13/18   Patwardhan, Reynold Bowen, MD  colchicine 0.6 MG tablet Take 1 tablet (0.6 mg total) by mouth daily. 12/16/18   Patwardhan, Reynold Bowen, MD  diltiazem (CARDIZEM CD) 240 MG 24 hr capsule Take 1 capsule (240 mg total) by mouth daily. 12/16/18   Patwardhan, Reynold Bowen, MD  furosemide (LASIX) 40 MG tablet TAKE 1 TABLET (40 MG TOTAL) BY MOUTH 2 (TWO) TIMES DAILY. 11/20/18   Gildardo Pounds, NP  glucose blood (ACCU-CHEK GUIDE) test strip Use as directed three times daily 02/10/19   Gildardo Pounds, NP  hydroxychloroquine (PLAQUENIL) 200 MG tablet Take  1 tablet (200 mg total) by mouth 2 (two) times daily. 01/18/18   Rai, Vernelle Emerald, MD  Insulin Glargine (BASAGLAR KWIKPEN) 100 UNIT/ML SOPN Inject 0.08 mLs (8 Units total) into the skin daily. 01/27/19   Charlott Rakes, MD  Insulin Pen Needle (TRUEPLUS PEN NEEDLES) 32G X 4 MM MISC Use as directed to inject insulin once daily 02/09/19   Gildardo Pounds, NP  losartan (COZAAR) 50 MG tablet Take 1 tablet (50 mg total) by mouth daily. 12/16/18 03/16/19  Patwardhan, Reynold Bowen, MD  medroxyPROGESTERone (DEPO-PROVERA) 150 MG/ML injection Inject 150 mg into the muscle every 3 (three) months.    [provider]  metFORMIN (GLUCOPHAGE) 1000 MG tablet Take 1 tablet (1,000 mg total) by mouth 2 (two) times daily with a meal. 09/16/18   Elsie Stain, MD  methocarbamol (ROBAXIN) 500 MG tablet Take 2 tablets (1,000 mg total) by mouth every 8 (eight) hours as needed for muscle spasms. 03/05/19   Argentina Donovan, PA-C  metoprolol tartrate (LOPRESSOR) 50 MG tablet Take 1 tablet (50 mg total) by mouth 2 (two) times daily. 12/11/18   Patwardhan, Reynold Bowen, MD  omeprazole (PRILOSEC) 20 MG capsule Take 1 capsule (20 mg total) by mouth daily as needed (for heartburn or GERD-like symptoms). Patient taking differently: Take 20 mg by mouth daily.  09/16/18 01/14/19  Elsie Stain, MD  Phenyleph-CPM-DM-Aspirin (ALKA-SELTZER PLUS COLD & COUGH PO) Take 2 tablets by mouth every 4 (four) hours as needed (cough/congestion).    [provider]  potassium chloride SA (K-DUR) 20 MEQ tablet Take 2 tablets (40 mEq total) by mouth daily. 12/16/18   Patwardhan, Reynold Bowen, MD  predniSONE (DELTASONE) 5 MG tablet Take 1 tablet (5 mg total) by mouth daily with breakfast. 03/04/19   Magdalen Spatz, NP  rosuvastatin (CRESTOR) 20 MG tablet Take 1 tablet (20 mg total) by mouth daily at 6 PM. 09/16/18   Elsie Stain, MD     Objective:  EXAM:   Vitals:   03/05/19 1400  BP: (!) 165/124  Pulse: (!) 109  Resp: 16  Temp: (!) 93 F  (33.9 C)  TempSrc: Oral  SpO2: 93%  Weight: 235 lb 3.2 oz (106.7 kg)    General appearance : A&OX3. NAD. Non-toxic-appearing HEENT: Atraumatic and Normocephalic.  PERRLA. EOM intact.   Neck: supple, no JVD. No cervical lymphadenopathy. No thyromegaly Chest/Lungs:  Breathing-non-labored, Good air entry bilaterally, breath sounds normal without rales, rhonchi, or wheezing  CVS: S1 S2 regular, no murmurs, gallops, rubs  Abdomen: Bowel sounds present, Non tender and not distended with no gaurding, rigidity or rebound.  No CVA TTP.  No  shingles like rash/dermatomal dysthesias.  Extremities: Bilateral Lower Ext shows no edema, both legs are warm to touch with = pulse throughout Neurology:  CN II-XII grossly intact, Non focal.   Psych:  TP linear. J/I WNL. Normal speech. Appropriate eye contact and affect.  Skin:  No Rash  Data Review Lab Results  Component Value Date   HGBA1C 6.2 (A) 09/16/2018   HGBA1C 9.0 (A) 07/02/2018   HGBA1C 9.1 (H) 03/13/2018     Assessment & Plan   1. RLQ abdominal pain Non-acute abdomen Nephrolithiasis vs Musculoskeletal/other - POCT URINALYSIS DIP (CLINITEK) - POCT urine pregnancy - Urine Culture  2. Type 2 diabetes mellitus with hyperglycemia, without long-term current use of insulin (HCC) Uncontrolled but on prednisone.  Will continue current regimen and check labs - Glucose (CBG) - Hemoglobin A1c - Comprehensive metabolic panel  3. Right flank pain - Comprehensive metabolic panel - CBC with Differential/Platelet - Urine Culture - DG Abd 1 View; Future - methocarbamol (ROBAXIN) 500 MG tablet; Take 2 tablets (1,000 mg total) by mouth every 8 (eight) hours as needed for muscle spasms.  Dispense: 90 tablet; Refill: 0  4. Essential hypertension Didn't take meds today.  Take meds as directed! - Comprehensive metabolic panel   Patient have been counseled extensively about nutrition and exercise  Return in about 3 months (around 06/05/2019) for  with PCP.  The patient was given clear instructions to go to ER or return to medical center if symptoms don't improve, worsen or new problems develop. The patient verbalized understanding. The patient was told to call to get lab results if they haven't heard anything in the next week.     Freeman Caldron, PA-C [facility] [place], [place]   03/05/2019, 2:13 PM

## 2019-03-06 LAB — COMPREHENSIVE METABOLIC PANEL
ALT: 9 IU/L (ref 0–32)
AST: 15 IU/L (ref 0–40)
Albumin/Globulin Ratio: 1 — ABNORMAL LOW (ref 1.2–2.2)
Albumin: 3.7 g/dL — ABNORMAL LOW (ref 3.8–4.8)
Alkaline Phosphatase: 71 IU/L (ref 39–117)
BUN/Creatinine Ratio: 11 (ref 9–23)
BUN: 12 mg/dL (ref 6–24)
Bilirubin Total: 0.2 mg/dL (ref 0.0–1.2)
CO2: 22 mmol/L (ref 20–29)
Calcium: 8.9 mg/dL (ref 8.7–10.2)
Chloride: 103 mmol/L (ref 96–106)
Creatinine, Ser: 1.07 mg/dL — ABNORMAL HIGH (ref 0.57–1.00)
GFR calc Af Amer: 73 mL/min/{1.73_m2} (ref >59)
GFR calc non Af Amer: 64 mL/min/{1.73_m2} (ref >59)
Globulin, Total: 3.6 g/dL (ref 1.5–4.5)
Glucose: 183 mg/dL — ABNORMAL HIGH (ref 65–99)
Potassium: 4.1 mmol/L (ref 3.5–5.2)
Sodium: 142 mmol/L (ref 134–144)
Total Protein: 7.3 g/dL (ref 6.0–8.5)

## 2019-03-06 LAB — HEMOGLOBIN A1C
Est. average glucose Bld gHb Est-mCnc: 240 mg/dL
Hgb A1c MFr Bld: 10 % — ABNORMAL HIGH (ref 4.8–5.6)

## 2019-03-06 LAB — CBC WITH DIFFERENTIAL/PLATELET
Basophils Absolute: 0 10*3/uL (ref 0.0–0.2)
Basos: 1 %
EOS (ABSOLUTE): 0.1 10*3/uL (ref 0.0–0.4)
Eos: 2 %
Hematocrit: 35.7 % (ref 34.0–46.6)
Hemoglobin: 11 g/dL — ABNORMAL LOW (ref 11.1–15.9)
Immature Grans (Abs): 0 10*3/uL (ref 0.0–0.1)
Immature Granulocytes: 1 %
Lymphocytes Absolute: 0.6 10*3/uL — ABNORMAL LOW (ref 0.7–3.1)
Lymphs: 11 %
MCH: 25 pg — ABNORMAL LOW (ref 26.6–33.0)
MCHC: 30.8 g/dL — ABNORMAL LOW (ref 31.5–35.7)
MCV: 81 fL (ref 79–97)
Monocytes Absolute: 0.4 10*3/uL (ref 0.1–0.9)
Monocytes: 6 %
Neutrophils Absolute: 4.8 10*3/uL (ref 1.4–7.0)
Neutrophils: 79 %
Platelets: 351 10*3/uL (ref 150–450)
RBC: 4.4 x10E6/uL (ref 3.77–5.28)
RDW: 16.4 % — ABNORMAL HIGH (ref 11.7–15.4)
WBC: 6 10*3/uL (ref 3.4–10.8)

## 2019-03-07 LAB — URINE CULTURE

## 2019-03-09 ENCOUNTER — Ambulatory Visit (INDEPENDENT_AMBULATORY_CARE_PROVIDER_SITE_OTHER): Payer: Self-pay | Admitting: Acute Care

## 2019-03-09 ENCOUNTER — Other Ambulatory Visit: Payer: Self-pay

## 2019-03-09 ENCOUNTER — Telehealth: Payer: Self-pay

## 2019-03-09 ENCOUNTER — Encounter: Payer: Self-pay | Admitting: Acute Care

## 2019-03-09 VITALS — BP 128/74 | HR 85 | Temp 98.2°F | Ht 60.0 in | Wt 234.2 lb

## 2019-03-09 DIAGNOSIS — I25119 Atherosclerotic heart disease of native coronary artery with unspecified angina pectoris: Secondary | ICD-10-CM

## 2019-03-09 DIAGNOSIS — E1169 Type 2 diabetes mellitus with other specified complication: Secondary | ICD-10-CM

## 2019-03-09 DIAGNOSIS — I4892 Unspecified atrial flutter: Secondary | ICD-10-CM

## 2019-03-09 DIAGNOSIS — J849 Interstitial pulmonary disease, unspecified: Secondary | ICD-10-CM

## 2019-03-09 DIAGNOSIS — D869 Sarcoidosis, unspecified: Secondary | ICD-10-CM

## 2019-03-09 DIAGNOSIS — M329 Systemic lupus erythematosus, unspecified: Secondary | ICD-10-CM

## 2019-03-09 MED ORDER — PREDNISONE 5 MG PO TABS: 7.5000 mg | ORAL_TABLET | Freq: Every day | ORAL | 0 refills | Status: DC

## 2019-03-09 NOTE — Telephone Encounter (Signed)
SG wants to make sure pt is scheduled for PFTs please. Thank you

## 2019-03-09 NOTE — Patient Instructions (Addendum)
It is good to see you today.  Your  PFT's are scheduled for 05/12/2019 We will order a 6 minute walk. I will talk with Dr. Chase Caller about possible bronch/ biopsy, fibrotic therapy. I will also talk with him about repeating your CT chest to evaluate for progression in the last 6 months.  Continue prednisone 7.5 mg daily for now.  We will send in a prescription. Remember to monitor your blood sugars carefully. Follow up with your PCP as needed to adjust your insulin needs.  Follow up in 1 month. Please contact office for sooner follow up if symptoms do not improve or worsen or seek emergency care  Let us know if you need to be seen sooner.

## 2019-03-09 NOTE — Telephone Encounter (Signed)
[name] I see it was scheduled already, disregard.

## 2019-03-09 NOTE — Progress Notes (Signed)
History of Present Illness [name] is a 43 y.o. female with  long-standing SLE, hypertension, type 2 diabetes mellitus, nonobstructive CAD, moderate aortic stenosis,chronicpericardial effusion without tampoande physiology (cath 03/2018), recurrent atrial flutter, now status post ablation 05/2018. She was seen as an inpatient by the Pulmonary Critical Care service.   Hospital Admission 12/08/2018-12/12/2018 for ILD flare.   03/09/2019 Follow up Pt called last week for a refill on her prednisone. When I saw her last on 01/12/2019 our plan was to decrease prednisone from 10 mg daily to 7.5 mg daily. She got to 7.5 mg, and did this for 2 weeks. She states she did try the 5 mg a day for 2 days, but she felt worse on the lower does and increased back to 7.5 mg.She ran out of medication after 2 weeks on 7.5 mg, and she did not call for a refill. She was off prednisone entirely for a week, and she became short of breath again. She states her cough is better, but her shortness of breath is worse with exertion.She has not done her 6 minute walk or her PFT's. She has been on prednisone since 11/2018. She denies any fever, chest pain, orthopnea or hemoptysis. Her cough has minimal secretions and they are clear. She is compliant with her Plaquenil and Imuran daily.  Test Results: HRCT 09/2018 Spectrum of findings compatible with fibrotic interstitial lung disease with mild basilar predominance, with progression since 2011 chest CT. No frank honeycombing. Differential includes usual interstitial pneumonia (UIP) or fibrotic phase nonspecific interstitial pneumonia (NSIP). Findings are categorized as probable UIP per consensus guidelines: Diagnosis of Idiopathic Pulmonary Fibrosis: An Official ATS/ERS/JRS/ALAT Clinical Practice Guideline. [place], Iss 5, [phone], Apr 13 2017. Moderate pericardial effusion. Three-vessel coronary atherosclerosis. Chronic stable bilateral  axillary and mediastinal lymphadenopathy, most compatible with benign etiology.  Labs 08/26/2018 >> ds DNA Ab: > 2 IU/mL 08/26/2018 >>  C3 Complement 103 mg/ dL 08/26/2018 >> C4 Complement 24 mg/dL   EKG05/08/2018: Sinus rhythm, aberrant conducted PAC's  R/LHC, coronary angiography and intervention 12/12/2018: RA: 10 mmHg RV: 46/7 mmHg. RVEDP 8 mmHg PA: 48/22 mmHg. Mean PA 33 mmHg PW: 17 mmHg LV 155/7 mmHg. LVEDP 10 mmHg <10 mmHg LV-Ao pullback gradient CO 7.8 L/min. CI 4 L/min/m2  Simultaneous RV-LV pressure tracings do not show interdependence to suggest constriction physiology.  LM: Wall calcium with no significant stenosis LAD: Ostial-proximal 90% stenosis. Normal diagonal branches. Successful PTCA and stent placement pLAD Synergy DES 3.5X16 mm DES Post dilatation with 4.0 X 8 mm [place] balloon. 0% residual stenosis Ramus: Normal LCx: Normal RCA: Normal  Echocardiogram 12/10/2018: 1. The left ventricle has low normal systolic function, with an ejection fraction of 50-55%. No evidence of left ventricular regional wall motion abnormalities. 2. Moderate posteriorly located pericardial effusion with no signs of tamponade. 3. This is a limited study with no significant change compared to previous study on 11/20/2018.  CBC Latest Ref Rng & Units 03/05/2019 12/12/2018 12/12/2018  WBC 3.4 - 10.8 x10E3/uL 6.0 - -  Hemoglobin 11.1 - 15.9 g/dL 11.0(L) 10.9(L) 10.9(L)  Hematocrit 34.0 - 46.6 % 35.7 32.0(L) 32.0(L)  Platelets 150 - 450 x10E3/uL 351 - -    BMP Latest Ref Rng & Units 03/05/2019 12/12/2018 12/12/2018  Glucose 65 - 99 mg/dL 183(H) - -  BUN 6 - 24 mg/dL 12 - -  Creatinine 0.57 - 1.00 mg/dL 1.07(H) - -  BUN/Creat Ratio 9 - 23 11 - -  Sodium 134 - 144 mmol/L 142 142 142  Potassium 3.5 - 5.2 mmol/L 4.1 3.4(L) 3.4(L)  Chloride 96 - 106 mmol/L 103 - -  CO2 20 - 29 mmol/L 22 - -  Calcium 8.7 - 10.2 mg/dL 8.9 - -    BNP    Component Value Date/Time   BNP 351.5 (H)  12/08/2018 1526    ProBNP    Component Value Date/Time   PROBNP 69.0 08/18/2008 1430    PFT No results found for: FEV1PRE, FEV1POST, FVCPRE, FVCPOST, TLC, DLCOUNC, PREFEV1FVCRT, PSTFEV1FVCRT  Dg Abd 1 View  Result Date: 03/05/2019 CLINICAL DATA:  Right flank pain. Hematuria. EXAM: ABDOMEN - 1 VIEW COMPARISON:  CT 08/25/2018 FINDINGS: No stones project over the renal beds, course of the ureters, or the bladder. There multiple pelvic phleboliths that were seen on prior exam. Pelvic calcifications course onto fibroids on CT. Normal bowel gas pattern. Small volume of colonic stool. Lung bases are clear. Prominent heart size as seen on prior. No acute osseous abnormalities. IMPRESSION: No urolithiasis demonstrated radiographically. Normal bowel gas pattern.  Calcified uterine fibroids. Electronically Signed   By: Keith Rake M.D.   On: 03/05/2019 21:42     Past medical hx Past Medical History:  Diagnosis Date  . Abnormal Pap smear of cervix    colpo, HPV  . Allergy   . Anemia   . Atrial fibrillation ([place])   . CHF (congestive heart failure) ([place])   . Depression    after losses  . Enlarged heart    managed by cardiology  . Fibroid   . Gestational diabetes   . Heart murmur   . Hypertension   . Infection    UTI  . Lupus ([place]) 1999     Social History   Tobacco Use  . Smoking status: Never Smoker  . Smokeless tobacco: Never Used  Substance Use Topics  . Alcohol use: Yes    Comment: ocasionally   . Drug use: No    [name] reports that she has never smoked. She has never used smokeless tobacco. She reports current alcohol use. She reports that she does not use drugs.  Tobacco Cessation: Never smoker  Past surgical hx, Family hx, Social hx all reviewed.  Current Outpatient Medications on File Prior to Visit  Medication Sig  . Accu-Chek FastClix Lancets MISC Use as directed three times daily  . acetaminophen (TYLENOL) 500 MG tablet Take 1,000 mg by mouth every 6  (six) hours as needed for headache (pain).  Marland Kitchen albuterol (PROVENTIL HFA;VENTOLIN HFA) 108 (90 Base) MCG/ACT inhaler Inhale 2 puffs into the lungs every 6 (six) hours as needed for wheezing or shortness of breath.  Marland Kitchen apixaban (ELIQUIS) 5 MG TABS tablet Take 1 tablet (5 mg total) by mouth 2 (two) times daily.  Marland Kitchen aspirin EC 81 MG tablet Take 81 mg by mouth daily.  Marland Kitchen azaTHIOprine (IMURAN) 50 MG tablet Take 50 mg by mouth 2 (two) times daily.  . benzonatate (TESSALON) 200 MG capsule Take 1 capsule (200 mg total) by mouth 3 (three) times daily as needed for cough.  . Blood Glucose Monitoring Suppl (ACCU-CHEK GUIDE) w/Device KIT 1 each by Does not apply route 3 (three) times daily.  . clopidogrel (PLAVIX) 75 MG tablet Take 1 tablet (75 mg total) by mouth daily with breakfast.  . colchicine 0.6 MG tablet Take 1 tablet (0.6 mg total) by mouth daily.  Marland Kitchen diltiazem (CARDIZEM CD) 240 MG 24 hr capsule Take 1 capsule (240 mg total)  by mouth daily.  . furosemide (LASIX) 40 MG tablet TAKE 1 TABLET (40 MG TOTAL) BY MOUTH 2 (TWO) TIMES DAILY.  Marland Kitchen glucose blood (ACCU-CHEK GUIDE) test strip Use as directed three times daily  . hydroxychloroquine (PLAQUENIL) 200 MG tablet Take 1 tablet (200 mg total) by mouth 2 (two) times daily.  . Insulin Glargine (BASAGLAR KWIKPEN) 100 UNIT/ML SOPN Inject 0.08 mLs (8 Units total) into the skin daily.  . Insulin Pen Needle (TRUEPLUS PEN NEEDLES) 32G X 4 MM MISC Use as directed to inject insulin once daily  . losartan (COZAAR) 50 MG tablet Take 1 tablet (50 mg total) by mouth daily.  . medroxyPROGESTERone (DEPO-PROVERA) 150 MG/ML injection Inject 150 mg into the muscle every 3 (three) months.  . metFORMIN (GLUCOPHAGE) 1000 MG tablet Take 1 tablet (1,000 mg total) by mouth 2 (two) times daily with a meal.  . methocarbamol (ROBAXIN) 500 MG tablet Take 2 tablets (1,000 mg total) by mouth every 8 (eight) hours as needed for muscle spasms.  . metoprolol tartrate (LOPRESSOR) 50 MG tablet Take  1 tablet (50 mg total) by mouth 2 (two) times daily.  Marland Kitchen Phenyleph-CPM-DM-Aspirin (ALKA-SELTZER PLUS COLD & COUGH PO) Take 2 tablets by mouth every 4 (four) hours as needed (cough/congestion).  . potassium chloride SA (K-DUR) 20 MEQ tablet Take 2 tablets (40 mEq total) by mouth daily.  . rosuvastatin (CRESTOR) 20 MG tablet Take 1 tablet (20 mg total) by mouth daily at 6 PM.  . cetirizine (ZYRTEC) 10 MG tablet Take 1 tablet (10 mg total) by mouth daily.  Marland Kitchen omeprazole (PRILOSEC) 20 MG capsule Take 1 capsule (20 mg total) by mouth daily as needed (for heartburn or GERD-like symptoms). (Patient taking differently: Take 20 mg by mouth daily. )   No current facility-administered medications on file prior to visit.      Allergies  Allergen Reactions  . Celery Oil Shortness Of Breath, Itching, Rash and Other (See Comments)    Bumps on tongue, also  . Peanut-Containing Drug Products Anaphylaxis, Shortness Of Breath, Itching, Rash and Other (See Comments)    Bumps on tongue, also  . Septra [Bactrim] Hives    Review Of Systems:  Constitutional:   No  weight loss, night sweats,  Fevers, chills, fatigue, or  lassitude.  HEENT:   No headaches,  Difficulty swallowing,  Tooth/dental problems, or  Sore throat,                No sneezing, itching, ear ache, nasal congestion, post nasal drip,   CV:  No chest pain,  Orthopnea, PND, swelling in lower extremities, anasarca, dizziness, palpitations, syncope.   GI  No heartburn, indigestion, abdominal pain, nausea, vomiting, diarrhea, change in bowel habits, loss of appetite, bloody stools.   Resp: + shortness of breath with exertion less at rest.  No excess mucus, no productive cough,  + non-productive cough,  No coughing up of blood.  No change in color of mucus.  No wheezing.  No chest wall deformity  Skin: no rash or lesions.  GU: no dysuria, change in color of urine, no urgency or frequency.  No flank pain, no hematuria   MS:  No joint pain or  swelling.  No decreased range of motion.  No back pain.  Psych:  No change in mood or affect. No depression or anxiety.  No memory loss.   Vital Signs BP 128/74 (BP Location: Left Arm, Cuff Size: Large)   Pulse 85   Temp 98.2 F (  36.8 C) (Oral)   Ht 5' (1.524 m)   Wt 234 lb 3.2 oz (106.2 kg)   LMP 02/26/2019 (Approximate)   SpO2 100%   BMI 45.74 kg/m    Physical Exam:  General- No distress,  A&Ox3, pleasant ENT: No sinus tenderness, TM clear, pale nasal mucosa, no oral exudate,no post nasal drip, no LAN Cardiac: S1, S2, regular rate and rhythm, no murmur Chest: No wheeze/ rales/ dullness; no accessory muscle use, no nasal flaring, no sternal retractions., + crackles 3/4 of the way up[ bilaterally. Abd.: Soft Non-tender, ND, BS +, Body mass index is 45.74 kg/m. Ext: No clubbing cyanosis, edema Neuro:  normal strength, MAE x 4, A&O x 3 Skin: No rashes, No lesions, warm and dry Psych: normal mood and behavior   Assessment/Plan  ILD (interstitial lung disease) (HCC) Progressive ILD per HRCT 09/2018 [facility] aggressive management  Additional diagnosis of SLE Family history of sarcoid and lupus Sleeps with a feather pillow Difficulty weaning off prednisone May need bronch/ biopsy/ anti fibrotic therapy Plan: We need to treat your ILD and Lupus agressively We will schedule you for a 6 minute walk   Pulmonary Function tests are scheduled for 05/12/2019. Continue prednisone 7.5 mg daily for now Our goal is to get you on the lowest possible dose  of prednisone to control your symptoms. Watch your blood sugars carefully while on prednisone. I will discuss with Dr. Chase Caller if we need to consider bronch, biopsy and possible fibrotic therapy. We may repeat your HRCT to evaluate for progression. If you have any issues with this therapy please call the office. Follow up in 1 month Please bring your  pulmonary fibrosis questionnaire to your next visit. If your symptoms return, go up  to the dosage that last was controlling your symptoms and call the office and let us know. Continue taking Lasix daily as prescribed. Continue Plaquenil as prescribed Continue Imuran as prescribed Continue Zyrtec and Prilosec. Please contact office for sooner follow up if symptoms do not improve or worsen or seek emergency care    Lupus [facility]) Cardiac involvement ? Pulmonary involvement vs ILD flare as cause of SOB and dry cough Plan We need to treat your ILD and Lupus aggressively Continue prednisone 7.5 mg daily for now. Watch your blood sugars carefully while on prednisone. IContinue taking Lasix daily as prescribed. Continue Plaquenil and Imuran as prescribed Follow up with Dr. Pearline Cables Continue Zyrtec and Prilosec. Please contact office for sooner follow up if symptoms do not improve or worsen or seek emergency care     Coronary artery disease Proximal LAD lesion previously seen to be moderate on coronary angiography in August 2019, appears to have progressed to a high-grade 90% lesion.  Intervention to this lesion with placement of  Synergy 3.5 x 16 mm drug-eluting stent with postdilatation with excellent results and no residual stenosis.  Patient did note improvement in shortness of breath symptoms after successful coronary revascularization.  Her resting tachycardia has also improved  Plan: Continue  Aspirin/plavix/Eliquis per cards Then stop Aspirin, continue eliquis and plavix for 6 months. Per cards, if she needs  lung biopsy in the near future, plavix could potentially be stopped after 3 months use ie in 03/2019.   Atrial flutter ([place]) Please continue  Eliquis as prescribed  Type 2 diabetes mellitus with hyperglycemia, with  long-term current use of insulin (HCC) Monitor blood sugars as you have been doing Please be aware that prednisone will increase your blood sugars Follow up with  PCP for blood sugar management We will continue to try to wean you off the  medication Please let us know if you have any issues.      Magdalen Spatz, NP 03/09/2019  10:20 AM

## 2019-03-10 ENCOUNTER — Telehealth: Payer: Self-pay | Admitting: *Deleted

## 2019-03-10 NOTE — Telephone Encounter (Signed)
Patient was contacted about lab results. Name and DOB verfied. ' McClung, Dionne Bucy, PA-C  Carilyn Goodpasture, RN        Your xray does not show kidney stones. Your diabetes is not controlled-your A1C =10.0. Take the higher dose of lantus as we discussed and eliminate sugars from your diet. Check your sugars as discussed. Follow-up as planned. Thanks, PA-C       Per patient she is taking/ has been taking Glargine insulin 16 units since last OV- 03/05/2019. She is almost out of this.  Also started back on Prednisone yesterday since last OV and was refilled on yesterday.   Back pain has improved but not yet relieved. Please advise.

## 2019-03-10 NOTE — Telephone Encounter (Signed)
-----   Message from Argentina Donovan, [place] sent at 03/06/2019  3:08 PM EDT ----- Your xray does not show kidney stones.  Your diabetes is not controlled-your A1C =10.0.  Take the higher dose of lantus as we discussed and eliminate sugars from your diet.  Check your sugars as discussed.  Follow-up as planned.  Thanks, PA-C

## 2019-03-11 ENCOUNTER — Other Ambulatory Visit: Payer: Self-pay

## 2019-03-11 ENCOUNTER — Other Ambulatory Visit: Payer: Self-pay | Admitting: Physician Assistant

## 2019-03-11 ENCOUNTER — Ambulatory Visit (INDEPENDENT_AMBULATORY_CARE_PROVIDER_SITE_OTHER): Payer: Medicaid Other

## 2019-03-11 VITALS — BP 156/89 | HR 82 | Wt 235.5 lb

## 2019-03-11 DIAGNOSIS — Z3042 Encounter for surveillance of injectable contraceptive: Secondary | ICD-10-CM | POA: Diagnosis not present

## 2019-03-11 DIAGNOSIS — E1165 Type 2 diabetes mellitus with hyperglycemia: Secondary | ICD-10-CM

## 2019-03-11 MED ORDER — TRUEPLUS PEN NEEDLES 32G X 4 MM MISC: 3 refills | Status: DC

## 2019-03-11 MED ORDER — MEDROXYPROGESTERONE ACETATE 150 MG/ML IM SUSP
150.0000 mg | Freq: Once | INTRAMUSCULAR | Status: AC
  Administered 2019-03-11: 150 mg via INTRAMUSCULAR

## 2019-03-11 MED ORDER — BASAGLAR KWIKPEN 100 UNIT/ML ~~LOC~~ SOPN: 20.0000 [IU] | PEN_INJECTOR | Freq: Every day | SUBCUTANEOUS | 0 refills | Status: DC

## 2019-03-11 MED FILL — ?BASAGLAR 100 UNITS/ML KWPE: 100 | 75 days supply | Qty: 15 | Fill #0

## 2019-03-11 MED FILL — TRUEPLUS PEN NDL 32GX5/32": 32G X 4 MM | 30 days supply | Qty: 100 | Fill #0

## 2019-03-11 MED FILL — TRUEPLUS PEN NDL 32GX5/32: 32G X 4 MM | 30 days supply | Qty: 100 | Fill #0

## 2019-03-11 NOTE — Telephone Encounter (Signed)
Please call pt again when possible

## 2019-03-11 NOTE — Progress Notes (Signed)
Chart reviewed for nurse visit. Agree with plan of care.   [place], CNM 03/11/2019 1:36 PM

## 2019-03-11 NOTE — Telephone Encounter (Signed)
Since she has been taking 16 units of the basaglar insulin, I will increase the dose to 20 units.  I sent a new prescription.  Thanks, Freeman Caldron, PA-C

## 2019-03-11 NOTE — Progress Notes (Signed)
Cheral Almas here for Depo-Provera  Injection.  Injection administered without complication. Patient will return in 3 months for next injection.  Verdell Carmine, RN 03/11/2019  10:07 AM

## 2019-03-11 NOTE — Telephone Encounter (Signed)
Left messsage on voicemail to return call.

## 2019-03-12 ENCOUNTER — Other Ambulatory Visit: Payer: Self-pay | Admitting: Nurse Practitioner

## 2019-03-12 DIAGNOSIS — I10 Essential (primary) hypertension: Secondary | ICD-10-CM

## 2019-03-12 MED FILL — $ELIQUIS 5 MG TABLET: 5 | 30 days supply | Qty: 60 | Fill #2

## 2019-03-12 MED FILL — FUROSEMIDE 40 MG TAB: 40 | 30 days supply | Qty: 60 | Fill #0

## 2019-03-12 MED FILL — CLOPIDOGREL 75 MG TABLET: 75 | 30 days supply | Qty: 30 | Fill #2

## 2019-03-12 MED FILL — ROSUVASTATIN CALCIUM 20 MG: 20 | 30 days supply | Qty: 30 | Fill #3

## 2019-03-12 NOTE — Telephone Encounter (Signed)
Patient informed of the dose change per provider [place], [place]. She is was encouraged to monitor blood sugar and educated on s/s of hypoglycemia. Pt verbalized understanding.

## 2019-03-17 MED FILL — azaTHIOprine 50 MG TABS: 50 | 30 days supply | Qty: 60 | Fill #0

## 2019-03-18 NOTE — Progress Notes (Signed)
Chart reviewed for nurse visit. Agree with plan of care.   Starr Lake, CNM 03/18/2019 1:57 PM

## 2019-03-19 ENCOUNTER — Other Ambulatory Visit: Payer: Self-pay

## 2019-03-19 ENCOUNTER — Ambulatory Visit: Payer: Medicaid Other | Admitting: Cardiology

## 2019-03-19 ENCOUNTER — Encounter: Payer: Self-pay | Admitting: Cardiology

## 2019-03-19 VITALS — BP 140/91 | HR 74 | Ht 60.0 in | Wt 235.0 lb

## 2019-03-19 DIAGNOSIS — I3139 Other pericardial effusion (noninflammatory): Secondary | ICD-10-CM

## 2019-03-19 DIAGNOSIS — I480 Paroxysmal atrial fibrillation: Secondary | ICD-10-CM

## 2019-03-19 DIAGNOSIS — I10 Essential (primary) hypertension: Secondary | ICD-10-CM

## 2019-03-19 DIAGNOSIS — I350 Nonrheumatic aortic (valve) stenosis: Secondary | ICD-10-CM

## 2019-03-19 DIAGNOSIS — I2510 Atherosclerotic heart disease of native coronary artery without angina pectoris: Secondary | ICD-10-CM

## 2019-03-19 DIAGNOSIS — I313 Pericardial effusion (noninflammatory): Secondary | ICD-10-CM

## 2019-03-19 NOTE — Progress Notes (Signed)
Subjective:   [name], female    DOB: [birth year], 43 y.o.   MRN: [id number]   I connected with the patient on 01/08/2019 by a video enabled telemedicine application and verified that I am speaking with the correct person using two identifiers.     I discussed the limitations of evaluation and management by telemedicine and the availability of in person appointments. The patient expressed understanding and agreed to proceed.   This visit type was conducted due to national recommendations for restrictions regarding the COVID-19 Pandemic (e.g. social distancing).  This format is felt to be most appropriate for this patient at this time.  All issues noted in this document were discussed and addressed.  No physical exam was performed (except for noted visual exam findings with Tele health visits).  The patient has consented to conduct a Tele health visit and understands insurance will be billed.   Chief complaint:  Shortness of breath  HPI  43 year old female with long-standing SLE, hypertension, type 2 diabetes mellitus, CAD s/p pLAD PCI for critical stenosis (12/12/2018), paroxysmal Afib w/RVR, moderate aortic stenosis, stable pericardial effusion without tamponade or constriction, interstitial lung disease.  Patient is now seeing pulmonology for her ILD. She is scheduled to [facility] 6 min walk test, PFT in the enar future. She is currently on prednisone 7.5 mg daily. THere may be further discussion regarding bronchoscopy.   Her cough has improved. She has stable exertional dyspnea. She denies any chest pain.  Past Medical History:  Diagnosis Date  . Abnormal Pap smear of cervix    colpo, HPV  . Allergy   . Anemia   . Atrial fibrillation ([place])   . CHF (congestive heart failure) ([place])   . Depression    after losses  . Enlarged heart    managed by cardiology  . Fibroid   . Gestational diabetes   . Heart murmur   . Hypertension   . Infection    UTI  . Lupus ([place]) 1999      Past Surgical History:  Procedure Laterality Date  . A-FLUTTER ABLATION N/A 06/10/2018   Procedure: A-FLUTTER ABLATION;  Surgeon: Evans Lance, MD;  Location: [place] [facility];  Service: Cardiovascular;  Laterality: N/A;  . CARDIAC CATHETERIZATION    . CORONARY STENT INTERVENTION N/A 12/12/2018   Procedure: CORONARY STENT INTERVENTION;  Surgeon: [name], MD;  Location: [place] [facility];  Service: Cardiovascular;  Laterality: N/A;  lad   . RIGHT/LEFT HEART CATH AND CORONARY ANGIOGRAPHY N/A 03/14/2018   Procedure: RIGHT/LEFT HEART CATH AND CORONARY ANGIOGRAPHY;  Surgeon: [name], MD;  Location: [place] [facility];  Service: Cardiovascular;  Laterality: N/A;  . RIGHT/LEFT HEART CATH AND CORONARY ANGIOGRAPHY N/A 12/12/2018   Procedure: RIGHT/LEFT HEART CATH AND CORONARY ANGIOGRAPHY;  Surgeon: [name], MD;  Location: [place] [facility];  Service: Cardiovascular;  Laterality: N/A;  . THERAPEUTIC ABORTION       Social History   Socioeconomic History  . Marital status: Married    Spouse name: Not on file  . Number of children: 2  . Years of education: Not on file  . Highest education level: Not on file  Occupational History  . Not on file  Social Needs  . Financial resource strain: Not on file  . Food insecurity    Worry: Patient refused    Inability: Patient refused  . Transportation needs    Medical: Patient refused    Non-medical: Patient  refused  Tobacco Use  . Smoking status: Never Smoker  . Smokeless tobacco: Never Used  Substance and Sexual Activity  . Alcohol use: Yes    Comment: ocasionally   . Drug use: No  . Sexual activity: Yes    Birth control/protection: Injection  Lifestyle  . Physical activity    Days per week: Patient refused    Minutes per session: Patient refused  . Stress: Not on file  Relationships  . Social Herbalist on phone: Patient refused    Gets together: Patient refused    Attends  religious service: Patient refused    Active member of club or organization: Patient refused    Attends meetings of clubs or organizations: Patient refused    Relationship status: Patient refused  . Intimate partner violence    Fear of current or ex partner: Patient refused    Emotionally abused: Patient refused    Physically abused: Patient refused    Forced sexual activity: Patient refused  Other Topics Concern  . Not on file  Social History Narrative  . Not on file     Current Outpatient Medications on File Prior to Visit  Medication Sig Dispense Refill  . Accu-Chek FastClix Lancets MISC Use as directed three times daily 102 each 12  . acetaminophen (TYLENOL) 500 MG tablet Take 1,000 mg by mouth every 6 (six) hours as needed for headache (pain).    Marland Kitchen albuterol (PROVENTIL HFA;VENTOLIN HFA) 108 (90 Base) MCG/ACT inhaler Inhale 2 puffs into the lungs every 6 (six) hours as needed for wheezing or shortness of breath. 1 Inhaler 2  . apixaban (ELIQUIS) 5 MG TABS tablet Take 1 tablet (5 mg total) by mouth 2 (two) times daily. 60 tablet 2  . azaTHIOprine (IMURAN) 50 MG tablet Take 50 mg by mouth 2 (two) times daily.    . benzonatate (TESSALON) 200 MG capsule Take 1 capsule (200 mg total) by mouth 3 (three) times daily as needed for cough. 30 capsule 0  . Blood Glucose Monitoring Suppl (ACCU-CHEK GUIDE) w/Device KIT 1 each by Does not apply route 3 (three) times daily. 1 kit 0  . cetirizine (ZYRTEC) 10 MG tablet Take 1 tablet (10 mg total) by mouth daily. 90 tablet 1  . clopidogrel (PLAVIX) 75 MG tablet Take 1 tablet (75 mg total) by mouth daily with breakfast. 60 tablet 2  . colchicine 0.6 MG tablet Take 1 tablet (0.6 mg total) by mouth daily. 60 tablet 1  . diltiazem (CARDIZEM CD) 240 MG 24 hr capsule Take 1 capsule (240 mg total) by mouth daily. 90 capsule 2  . furosemide (LASIX) 40 MG tablet TAKE 1 TABLET (40 MG TOTAL) BY MOUTH 2 (TWO) TIMES DAILY. 60 tablet 2  . glucose blood (ACCU-CHEK  GUIDE) test strip Use as directed three times daily 100 each 12  . hydroxychloroquine (PLAQUENIL) 200 MG tablet Take 1 tablet (200 mg total) by mouth 2 (two) times daily. 60 tablet 4  . Insulin Glargine (BASAGLAR KWIKPEN) 100 UNIT/ML SOPN Inject 0.2 mLs (20 Units total) into the skin daily. Patient has been taking 16 units daily 15 mL 0  . Insulin Pen Needle (TRUEPLUS PEN NEEDLES) 32G X 4 MM MISC Use as directed to inject insulin once daily 100 each 3  . losartan (COZAAR) 50 MG tablet Take 1 tablet (50 mg total) by mouth daily. 30 tablet 3  . medroxyPROGESTERone (DEPO-PROVERA) 150 MG/ML injection Inject 150 mg into the muscle every 3 (three) months.    Marland Kitchen  metFORMIN (GLUCOPHAGE) 1000 MG tablet Take 1 tablet (1,000 mg total) by mouth 2 (two) times daily with a meal. 60 tablet 3  . methocarbamol (ROBAXIN) 500 MG tablet Take 2 tablets (1,000 mg total) by mouth every 8 (eight) hours as needed for muscle spasms. 90 tablet 0  . metoprolol tartrate (LOPRESSOR) 50 MG tablet Take 1 tablet (50 mg total) by mouth 2 (two) times daily. 90 tablet 3  . omeprazole (PRILOSEC) 20 MG capsule Take 1 capsule (20 mg total) by mouth daily as needed (for heartburn or GERD-like symptoms). (Patient taking differently: Take 20 mg by mouth daily. ) 30 capsule 3  . Phenyleph-CPM-DM-Aspirin (ALKA-SELTZER PLUS COLD & COUGH PO) Take 2 tablets by mouth every 4 (four) hours as needed (cough/congestion).    . potassium chloride SA (K-DUR) 20 MEQ tablet Take 2 tablets (40 mEq total) by mouth daily. 90 tablet 2  . predniSONE (DELTASONE) 5 MG tablet Take 1.5 tablets (7.5 mg total) by mouth daily with breakfast. 90 tablet 0  . rosuvastatin (CRESTOR) 20 MG tablet Take 1 tablet (20 mg total) by mouth daily at 6 PM. 30 tablet 4   No current facility-administered medications on file prior to visit.     Cardiovascular studies:  EKG 12/16/2018: Sinus tachycardia 106 bpm Right axis deviation  Significant Diagnostic Studies:  CARDIAC  STUDIES:  EKG 12/19/18: Sinus rhythm, aberrant conducted PAC's  R/LHC, coronary angiography and intervention 2018/12/19: RA: 10 mmHg RV: 46/7 mmHg. RVEDP 8 mmHg PA: 48/22 mmHg. Mean PA 33 mmHg PW: 17 mmHg LV 155/7 mmHg. LVEDP 10 mmHg <10 mmHg LV-Ao pullback gradient CO 7.8 L/min. CI 4 L/min/m2  Simultaneous RV-LV pressure tracings do not show interdependence to suggest constriction physiology.  LM: Wall calcium with no significant stenosis LAD: Ostial-proximal 90% stenosis. Normal diagonal branches. Successful PTCA and stent placement pLAD Synergy DES 3.5X16 mm DES Post dilatation with 4.0 X 8 mm Loop balloon. 0% residual stenosis Ramus: Normal LCx: Normal RCA: Normal  Echocardiogram 12/10/2018: 1. The left ventricle has low normal systolic function, with an ejection fraction of 50-55%. No evidence of left ventricular regional wall motion abnormalities. 2. Moderate posteriorly located pericardial effusion with no signs of tamponade. 3. This is a limited study with no significant change compared to previous study on 11/20/2018.  Recent labs: Results for MILAYA, HORA (MRN [id number]) as of 12/16/2018 15:15  Ref. Range 12/11/2018 03:35 12/19/2018 03:27 12-19-18 08:50 2018/12/19 81:82  BASIC METABOLIC PANEL Unknown Rpt (A) Rpt (A)    Sodium Latest Ref Range: 135 - 145 mmol/L 136 138 142 142  Potassium Latest Ref Range: 3.5 - 5.1 mmol/L 3.7 4.5 3.4 (L) 3.4 (L)  Chloride Latest Ref Range: 98 - 111 mmol/L 104 106    CO2 Latest Ref Range: 22 - 32 mmol/L 21 (L) 20 (L)    Glucose Latest Ref Range: 70 - 99 mg/dL 119 (H) 119 (H)    BUN Latest Ref Range: 6 - 20 mg/dL 12 13    Creatinine Latest Ref Range: 0.44 - 1.00 mg/dL 0.88 0.95    Calcium Latest Ref Range: 8.9 - 10.3 mg/dL 9.4 9.5    Anion gap Latest Ref Range: 5 - _0 Calcium Ionized Latest Ref Range: 1.15 - 1.40 mmol/L   1.22 1.21  GFR, Est Non African American Latest Ref Range: >60 mL/min >60 >60    GFR, Est  African American Latest Ref Range: >60 mL/min >60 >60    Procalcitonin Latest  Units: ng/mL <0.10      Results for DEANDRE, STANSEL (MRN [id number]) as of 12/16/2018 15:15  Ref. Range 12/10/2018 08:54 12/11/2018 09:41 12/12/2018 08:50 12/12/2018 08:51  WBC Latest Ref Range: 4.0 - 10.5 K/uL 5.9 5.5    RBC Latest Ref Range: 3.87 - 5.11 MIL/uL 4.49 4.58    Hemoglobin Latest Ref Range: 12.0 - 15.0 g/dL 11.2 (L) 11.1 (L) 10.9 (L) 10.9 (L)  HCT Latest Ref Range: 36.0 - 46.0 % 33.7 (L) 34.1 (L) 32.0 (L) 32.0 (L)  MCV Latest Ref Range: 80.0 - 100.0 fL 75.1 (L) 74.5 (L)    MCH Latest Ref Range: 26.0 - 34.0 pg 24.9 (L) 24.2 (L)    MCHC Latest Ref Range: 30.0 - 36.0 g/dL 33.2 32.6    RDW Latest Ref Range: 11.5 - 15.5 % 14.6 14.3    Platelets Latest Ref Range: 150 - 400 K/uL 331 396    nRBC Latest Ref Range: 0.0 - 0.2 % 0.0 0.0      Review of Systems  Constitution: Negative for decreased appetite, malaise/fatigue, weight gain and weight loss.  HENT: Negative for congestion.   Eyes: Negative for visual disturbance.  Cardiovascular: Positive for dyspnea on exertion. Negative for chest pain, leg swelling, palpitations and syncope.  Respiratory: Positive for cough and shortness of breath.   Endocrine: Negative for cold intolerance.  Hematologic/Lymphatic: Does not bruise/bleed easily.  Skin: Negative for itching and rash.  Musculoskeletal: Negative for myalgias.  Gastrointestinal: Negative for abdominal pain, nausea and vomiting.  Genitourinary: Negative for dysuria.  Neurological: Negative for dizziness and weakness.  Psychiatric/Behavioral: The patient is not nervous/anxious.   All other systems reviewed and are negative.       Vitals:   03/19/19 1047 03/19/19 1111  BP: (!) 167/109 (!) 140/91  Pulse: 92 74  SpO2: 96%     Physical Exam  Constitutional: She is oriented to person, place, and time. She appears well-developed and well-nourished. No distress.  Pulmonary/Chest: Effort normal.   Neurological: She is alert and oriented to person, place, and time.  Psychiatric: She has a normal mood and affect.  Nursing note and vitals reviewed.         Assessment & Recommendations:   43 year old female with long-standing SLE, hypertension, type 2 diabetes mellitus, CAD s/p pLAD PCI for critical stenosis (12/12/2018), paroxysmal Afib w/RVR, moderate aortic stenosis, stable pericardial effusion without tamponade or constriction (Cath 12/2018), interstitial lung disease, here for transition of care follow up.  Shortness of breath: Most likely due to interstitial lung disease.  Continue follow-up with pulmonology with plans for upcoming 6-minute walk test, and possible biopsy.  With regards to her antiplatelet/anticoagulation regimen, please see below.  CAD: S/p pLAD PCI.  Continue eliquis and plavix without Aspirin. Biopsy may be needed soon for diagnosis and management of interstitial lung disease.  In case of bronchoscopy, recommend stopping Plavix 5 days before bronchoscopy, stop Eliquis 2 days before bronchoscopy. Restart aspirin 5 days before bronchoscopy and continue periprocedural.,  If possible. Resume Eliquis and Plavix, when deemed safe after the bronchoscopy, and stop aspirin. Although she has small risk of stent thrombosis with interruption of antiplatelet therapy, she has large size stent with good expansion and apposition.  Benefits of biopsy likely outweigh risk of stent thrombosis. Continue crestor 20 mg.  Paroxysmal Afib: Continue metoprolol tartarate 50 mg bid, diltiazem to 240 mg daily.  CHA2DS2VASc score 3. On eliquis 5 mg bid  Hypertension: Fairly well controlled on current therapy.  Pericardial  effusion: Stable without tamponade or constriction. Likely related to lupus. Continue lupus management as per rheumatology.   Moderate aortic stenosis: Aortic stenosis of trileaflet valve.Continue serial monitoring with echocardiogram. Repeat echocardiogram in  09/2019.  Type 2 DM: Continue current management per PCP, along with Jardiance.  Lupus: Follow up with rheumatology.  F/u in 6 months.  [name], MD [facility]. [place] Pager: [phone] Office: [phone] If no answer Cell [phone]

## 2019-03-26 MED FILL — ?METOPROLOL 50 MG TABLET: 50 | 30 days supply | Qty: 60 | Fill #3

## 2019-03-26 MED FILL — DILTIAZEM 24HR CD 240 MG CA: 240 | 30 days supply | Qty: 30 | Fill #3

## 2019-03-26 MED FILL — LOSARTAN POTASSIUM 50 MG TA: 50 | 30 days supply | Qty: 30 | Fill #3

## 2019-03-26 MED FILL — HYDROXYCHLOROQUINE SULFATE: 200 | 30 days supply | Qty: 60 | Fill #3

## 2019-04-03 MED FILL — traMADol HCL 50 MG TABS: 50 | 7 days supply | Qty: 14 | Fill #5

## 2019-04-03 MED FILL — azaTHIOprine 50 MG TABS: 50 | 30 days supply | Qty: 60 | Fill #0

## 2019-04-03 MED FILL — HYDROXYCHLOROQUINE SULFATE: 200 | 30 days supply | Qty: 60 | Fill #3

## 2019-04-03 MED FILL — LOSARTAN POTASSIUM 50 MG TA: 50 | 30 days supply | Qty: 30 | Fill #3

## 2019-04-03 MED FILL — DILTIAZEM 24HR CD 240 MG CA: 240 | 30 days supply | Qty: 30 | Fill #3

## 2019-04-10 MED FILL — MITIGARE 0.6 MG CAPSULE: 0.6 | 30 days supply | Qty: 30 | Fill #1

## 2019-04-10 MED FILL — CLOPIDOGREL 75 MG TABLET: 75 | 30 days supply | Qty: 30 | Fill #3

## 2019-04-13 ENCOUNTER — Emergency Department (HOSPITAL_COMMUNITY): Payer: 59

## 2019-04-13 ENCOUNTER — Ambulatory Visit: Payer: Medicaid Other | Admitting: Internal Medicine

## 2019-04-13 ENCOUNTER — Encounter (HOSPITAL_COMMUNITY): Payer: Self-pay | Admitting: Emergency Medicine

## 2019-04-13 ENCOUNTER — Other Ambulatory Visit: Payer: Self-pay

## 2019-04-13 ENCOUNTER — Emergency Department (HOSPITAL_BASED_OUTPATIENT_CLINIC_OR_DEPARTMENT_OTHER): Payer: 59

## 2019-04-13 ENCOUNTER — Ambulatory Visit: Payer: Medicaid Other

## 2019-04-13 ENCOUNTER — Emergency Department (HOSPITAL_COMMUNITY)
Admission: EM | Admit: 2019-04-13 | Discharge: 2019-04-13 | Disposition: A | Discharge status: Home / Self Care | Payer: 59 | Attending: Emergency Medicine | Admitting: Emergency Medicine

## 2019-04-13 ENCOUNTER — Emergency Department (HOSPITAL_COMMUNITY): Admit: 2019-04-13 | Payer: 59

## 2019-04-13 DIAGNOSIS — M79604 Pain in right leg: Secondary | ICD-10-CM | POA: Diagnosis not present

## 2019-04-13 DIAGNOSIS — Z7901 Long term (current) use of anticoagulants: Secondary | ICD-10-CM | POA: Diagnosis not present

## 2019-04-13 DIAGNOSIS — Z91010 Allergy to peanuts: Secondary | ICD-10-CM | POA: Diagnosis not present

## 2019-04-13 DIAGNOSIS — R071 Chest pain on breathing: Secondary | ICD-10-CM | POA: Insufficient documentation

## 2019-04-13 DIAGNOSIS — R05 Cough: Secondary | ICD-10-CM

## 2019-04-13 DIAGNOSIS — Z79899 Other long term (current) drug therapy: Secondary | ICD-10-CM | POA: Insufficient documentation

## 2019-04-13 DIAGNOSIS — E119 Type 2 diabetes mellitus without complications: Secondary | ICD-10-CM | POA: Diagnosis not present

## 2019-04-13 DIAGNOSIS — R52 Pain, unspecified: Secondary | ICD-10-CM

## 2019-04-13 DIAGNOSIS — I5032 Chronic diastolic (congestive) heart failure: Secondary | ICD-10-CM | POA: Diagnosis not present

## 2019-04-13 DIAGNOSIS — I110 Hypertensive heart disease with heart failure: Secondary | ICD-10-CM | POA: Diagnosis not present

## 2019-04-13 DIAGNOSIS — R0602 Shortness of breath: Secondary | ICD-10-CM

## 2019-04-13 DIAGNOSIS — R0789 Other chest pain: Secondary | ICD-10-CM | POA: Diagnosis not present

## 2019-04-13 DIAGNOSIS — R059 Cough, unspecified: Secondary | ICD-10-CM

## 2019-04-13 LAB — CBC WITH DIFFERENTIAL/PLATELET
Abs Immature Granulocytes: 0.06 10*3/uL (ref 0.00–0.07)
Basophils Absolute: 0 10*3/uL (ref 0.0–0.1)
Basophils Relative: 0 %
Eosinophils Absolute: 0 10*3/uL (ref 0.0–0.5)
Eosinophils Relative: 0 %
HCT: 37.2 % (ref 36.0–46.0)
Hemoglobin: 11.7 g/dL — ABNORMAL LOW (ref 12.0–15.0)
Immature Granulocytes: 1 %
Lymphocytes Relative: 10 %
Lymphs Abs: 1 10*3/uL (ref 0.7–4.0)
MCH: 24.7 pg — ABNORMAL LOW (ref 26.0–34.0)
MCHC: 31.5 g/dL (ref 30.0–36.0)
MCV: 78.5 fL — ABNORMAL LOW (ref 80.0–100.0)
Monocytes Absolute: 0.6 10*3/uL (ref 0.1–1.0)
Monocytes Relative: 6 %
Neutro Abs: 8.8 10*3/uL — ABNORMAL HIGH (ref 1.7–7.7)
Neutrophils Relative %: 83 %
Platelets: 288 10*3/uL (ref 150–400)
RBC: 4.74 MIL/uL (ref 3.87–5.11)
RDW: 15.1 % (ref 11.5–15.5)
WBC: 10.5 10*3/uL (ref 4.0–10.5)
nRBC: 0 % (ref 0.0–0.2)

## 2019-04-13 LAB — BASIC METABOLIC PANEL
Anion gap: 10 (ref 5–15)
BUN: 9 mg/dL (ref 6–20)
CO2: 23 mmol/L (ref 22–32)
Calcium: 8.9 mg/dL (ref 8.9–10.3)
Chloride: 104 mmol/L (ref 98–111)
Creatinine, Ser: 0.94 mg/dL (ref 0.44–1.00)
GFR calc Af Amer: >60 mL/min (ref >60)
GFR calc non Af Amer: >60 mL/min (ref >60)
Glucose, Bld: 334 mg/dL — ABNORMAL HIGH (ref 70–99)
Potassium: 3.7 mmol/L (ref 3.5–5.1)
Sodium: 137 mmol/L (ref 135–145)

## 2019-04-13 LAB — TROPONIN I (HIGH SENSITIVITY)
Troponin I (High Sensitivity): 42 ng/L — ABNORMAL HIGH (ref <18)
Troponin I (High Sensitivity): 44 ng/L — ABNORMAL HIGH (ref <18)

## 2019-04-13 LAB — I-STAT BETA HCG BLOOD, ED (MC, WL, AP ONLY): I-stat hCG, quantitative: <5 m[IU]/mL (ref <5)

## 2019-04-13 LAB — D-DIMER, QUANTITATIVE: D-Dimer, Quant: 0.33 ug/mL-FEU (ref 0.00–0.50)

## 2019-04-13 MED ORDER — CYCLOBENZAPRINE HCL 10 MG PO TABS: 10.0000 mg | ORAL_TABLET | Freq: Two times a day (BID) | ORAL | 0 refills | Status: DC | PRN

## 2019-04-13 MED ORDER — HYDROMORPHONE HCL 1 MG/ML IJ SOLN
1.0000 mg | Freq: Once | INTRAMUSCULAR | Status: AC
  Administered 2019-04-13: 17:00:00 1 mg via INTRAVENOUS
  Filled 2019-04-13 (×2): qty 1

## 2019-04-13 MED ORDER — APIXABAN 5 MG PO TABS
5.0000 mg | ORAL_TABLET | Freq: Two times a day (BID) | ORAL | Status: AC
  Administered 2019-04-13: 5 mg via ORAL

## 2019-04-13 MED ORDER — APIXABAN 5 MG PO TABS
5.0000 mg | ORAL_TABLET | Freq: Two times a day (BID) | ORAL | Status: DC
  Filled 2019-04-13: qty 1

## 2019-04-13 MED ORDER — ONDANSETRON HCL 4 MG/2ML IJ SOLN
4.0000 mg | Freq: Once | INTRAMUSCULAR | Status: AC
  Administered 2019-04-13: 17:00:00 4 mg via INTRAVENOUS
  Filled 2019-04-13: qty 2

## 2019-04-13 MED ORDER — LIDOCAINE 5 % EX PTCH: 1.0000 | MEDICATED_PATCH | CUTANEOUS | 0 refills | Status: DC

## 2019-04-13 NOTE — ED Notes (Signed)
Walking Sa02 was 94% at lowest.

## 2019-04-13 NOTE — ED Provider Notes (Signed)
[facility] EMERGENCY DEPARTMENT Provider Note   CSN: [id number] Arrival date & time: 04/13/19  1101     History   Chief Complaint Chief Complaint  Patient presents with   Cough   Flank Pain    HPI [name] is a 43 y.o. female with history of paroxysmal A. fib, SLE, hypertension, interstitial lung disease, coronary artery disease, type 2 diabetes mellitus presents for evaluation of acute onset, progressively worsening right-sided chest pain and shortness of breath for 1 week worsening over the last 2 days.  She reports sharp pains to the right side of the chest and back which worsens with deep inspiration.  She reports significant shortness of breath with any kind of exertion including going up stairs and walking.  She denies abdominal pain, nausea, vomiting, lightheadedness, or diaphoresis.  Yesterday she noticed that her right lower extremity felt sore but not significantly swollen.  She is on Eliquis for her A. fib, reports that she has been compliant with her medication but only had 1 dose yesterday and none today as she is due for refill.  She has been worked up for similar pains by her PCP in the past.  Denies recent travel or surgeries, no hemoptysis, no prior history of DVT or PE.  She is on Depo-Provera every 3 months for birth control.  She has taken tramadol and a muscle relaxer without relief of her symptoms.     The history is provided by the patient.    Past Medical History:  Diagnosis Date   Abnormal Pap smear of cervix    colpo, HPV   Allergy    Anemia    Atrial fibrillation (HCC)    CHF (congestive heart failure) (HCC)    Depression    after losses   Enlarged heart    managed by cardiology   Fibroid    Gestational diabetes    Heart murmur    Hypertension    Infection    UTI   Lupus ([place]) 1999    Patient Active Problem List   Diagnosis Date Noted   ILD (interstitial lung disease) ([place]) 01/12/2019   Coronary  artery disease 12/12/2018   Nonrheumatic aortic valve stenosis 11/19/2018   Postinflammatory pulmonary fibrosis ([place]) 09/25/2018   Hypokalemia 09/17/2018   AKI (acute kidney injury) ([place]) 08/25/2018   Encounter for annual routine gynecological examination 07/09/2018   Abnormal vaginal Pap smear 07/09/2018   Depo-Provera contraceptive status 07/09/2018   Paroxysmal atrial fibrillation ([place]) 05/12/2018   Atrial flutter ([place]) 05/12/2018   Pericardial effusion 01/16/2018   Chronic diastolic CHF (congestive heart failure) ([place]) 01/16/2018   SOB (shortness of breath) 12/25/2017   Hypertension 12/25/2017   Microcytic anemia 12/25/2017   Type 2 diabetes mellitus with hyperglycemia, without long-term current use of insulin ([place]) 12/25/2017   Chronic pain of both knees 04/23/2017   Fibroids 01/20/2015   Menorrhagia 01/20/2015   Low grade squamous intraepithelial lesion (LGSIL) on cervical Pap smear on 01/20/15 01/20/2015   Atypical glandular cells on cervical Pap smear on 01/20/15 01/20/2015   Lupus ([place]) 07/15/2013    Past Surgical History:  Procedure Laterality Date   A-FLUTTER ABLATION N/A 06/10/2018   Procedure: A-FLUTTER ABLATION;  Surgeon: Evans Lance, MD;  Location: [facility];  Service: Cardiovascular;  Laterality: N/A;   CARDIAC CATHETERIZATION     CORONARY STENT INTERVENTION N/A 12/12/2018   Procedure: CORONARY STENT INTERVENTION;  Surgeon: [name], MD;  Location: [facility];  Service: Cardiovascular;  Laterality: N/A;  lad    RIGHT/LEFT HEART CATH AND CORONARY ANGIOGRAPHY N/A 03/14/2018   Procedure: RIGHT/LEFT HEART CATH AND CORONARY ANGIOGRAPHY;  Surgeon: [name], MD;  Location: [facility];  Service: Cardiovascular;  Laterality: N/A;   RIGHT/LEFT HEART CATH AND CORONARY ANGIOGRAPHY N/A 12/12/2018   Procedure: RIGHT/LEFT HEART CATH AND CORONARY ANGIOGRAPHY;  Surgeon: [name], MD;  Location: [facility];  Service: Cardiovascular;  Laterality: N/A;   THERAPEUTIC ABORTION       OB History    Gravida  5   Para  2   Term  0   Preterm  2   AB  3   Living  2     SAB  2   TAB  1   Ectopic  0   Multiple  0   Live Births  2            Home Medications    Prior to Admission medications   Medication Sig Start Date End Date Taking? Authorizing Provider  Accu-Chek FastClix Lancets MISC Use as directed three times daily 02/10/19   Gildardo Pounds, NP  acetaminophen (TYLENOL) 500 MG tablet Take 1,000 mg by mouth every 6 (six) hours as needed for headache (pain).    [provider]  albuterol (PROVENTIL HFA;VENTOLIN HFA) 108 (90 Base) MCG/ACT inhaler Inhale 2 puffs into the lungs every 6 (six) hours as needed for wheezing or shortness of breath. 11/17/18   Gildardo Pounds, NP  apixaban (ELIQUIS) 5 MG TABS tablet Take 1 tablet (5 mg total) by mouth 2 (two) times daily. 12/12/18   Patwardhan, Reynold Bowen, MD  azaTHIOprine (IMURAN) 50 MG tablet Take 50 mg by mouth 2 (two) times daily.    [provider]  benzonatate (TESSALON) 200 MG capsule Take 1 capsule (200 mg total) by mouth 3 (three) times daily as needed for cough. 08/27/18   Mikhail, Velta Addison, DO  Blood Glucose Monitoring Suppl (ACCU-CHEK GUIDE) w/Device KIT 1 each by Does not apply route 3 (three) times daily. 02/10/19   Gildardo Pounds, NP  cetirizine (ZYRTEC) 10 MG tablet Take 1 tablet (10 mg total) by mouth daily. 11/17/18 02/15/19  Gildardo Pounds, NP  clopidogrel (PLAVIX) 75 MG tablet Take 1 tablet (75 mg total) by mouth daily with breakfast. 12/13/18   Patwardhan, Reynold Bowen, MD  colchicine 0.6 MG tablet Take 1 tablet (0.6 mg total) by mouth daily. 12/16/18   Patwardhan, Reynold Bowen, MD  cyclobenzaprine (FLEXERIL) 10 MG tablet Take 1 tablet (10 mg total) by mouth 2 (two) times daily as needed. 04/13/19   Nazarene Bunning A, PA-C  diltiazem (CARDIZEM CD) 240 MG 24 hr capsule Take 1 capsule (240 mg total) by mouth  daily. 12/16/18   Patwardhan, Manish J, MD  furosemide (LASIX) 40 MG tablet TAKE 1 TABLET (40 MG TOTAL) BY MOUTH 2 (TWO) TIMES DAILY. 03/12/19   Gildardo Pounds, NP  glucose blood (ACCU-CHEK GUIDE) test strip Use as directed three times daily 02/10/19   Gildardo Pounds, NP  hydroxychloroquine (PLAQUENIL) 200 MG tablet Take 1 tablet (200 mg total) by mouth 2 (two) times daily. 01/18/18   Rai, Vernelle Emerald, MD  Insulin Glargine (BASAGLAR KWIKPEN) 100 UNIT/ML SOPN Inject 0.2 mLs (20 Units total) into the skin daily. Patient has been taking 16 units daily 03/11/19   Freeman Caldron M, PA-C  Insulin Pen Needle (TRUEPLUS PEN NEEDLES) 32G X 4 MM MISC  Use as directed to inject insulin once daily 03/11/19   Freeman Caldron M, PA-C  lidocaine (LIDODERM) 5 % Place 1 patch onto the skin daily. Remove & Discard patch within 12 hours or as directed by MD 04/13/19   Rodell Perna A, PA-C  losartan (COZAAR) 50 MG tablet Take 1 tablet (50 mg total) by mouth daily. 12/16/18 03/16/19  Patwardhan, Reynold Bowen, MD  medroxyPROGESTERone (DEPO-PROVERA) 150 MG/ML injection Inject 150 mg into the muscle every 3 (three) months.    [provider]  metFORMIN (GLUCOPHAGE) 1000 MG tablet Take 1 tablet (1,000 mg total) by mouth 2 (two) times daily with a meal. 09/16/18   Elsie Stain, MD  methocarbamol (ROBAXIN) 500 MG tablet Take 2 tablets (1,000 mg total) by mouth every 8 (eight) hours as needed for muscle spasms. 03/05/19   Argentina Donovan, PA-C  omeprazole (PRILOSEC) 20 MG capsule Take 1 capsule (20 mg total) by mouth daily as needed (for heartburn or GERD-like symptoms). Patient taking differently: Take 20 mg by mouth daily.  09/16/18 01/14/19  Elsie Stain, MD  Phenyleph-CPM-DM-Aspirin (ALKA-SELTZER PLUS COLD & COUGH PO) Take 2 tablets by mouth every 4 (four) hours as needed (cough/congestion).    [provider]  potassium chloride SA (K-DUR) 20 MEQ tablet Take 2 tablets (40 mEq total) by mouth daily. 12/16/18    Patwardhan, Reynold Bowen, MD  predniSONE (DELTASONE) 5 MG tablet Take 1.5 tablets (7.5 mg total) by mouth daily with breakfast. 03/09/19   Magdalen Spatz, NP  rosuvastatin (CRESTOR) 20 MG tablet Take 1 tablet (20 mg total) by mouth daily at 6 PM. 09/16/18   Elsie Stain, MD  traMADol (ULTRAM) 50 MG tablet Take 1 tablet by mouth as needed. 02/16/19   [provider]    Family History Family History  Problem Relation Age of Onset   Cancer Maternal Grandfather        lung   Diabetes Mother    Hypertension Mother    Hypertension Maternal Grandmother    Diabetes Father    Hearing loss Neg Hx     Social History Social History   Tobacco Use   Smoking status: Never Smoker   Smokeless tobacco: Never Used  Substance Use Topics   Alcohol use: Yes    Comment: ocasionally    Drug use: No     Allergies   Celery oil, Peanut-containing drug products, and Septra [bactrim]   Review of Systems Review of Systems  Constitutional: Negative for chills and fever.  Respiratory: Positive for cough (Chronic unchanged) and shortness of breath.   Cardiovascular: Positive for chest pain.  Gastrointestinal: Negative for abdominal pain, nausea and vomiting.  All other systems reviewed and are negative.    Physical Exam Updated Vital Signs BP 117/89    Pulse 84    Temp 99.7 F (37.6 C) (Oral)    Resp 10    Ht 5' (1.524 m)    Wt 104.3 kg    LMP  (Exact Date)    SpO2 95%    BMI 44.92 kg/m   Physical Exam Vitals signs and nursing note reviewed.  Constitutional:      General: She is not in acute distress.    Appearance: She is well-developed. She is obese.  HENT:     Head: Normocephalic and atraumatic.  Eyes:     General:        Right eye: No discharge.        Left eye: No  discharge.     Conjunctiva/sclera: Conjunctivae normal.  Neck:     Musculoskeletal: Normal range of motion and neck supple.     Vascular: No JVD.     Trachea: No tracheal deviation.  Cardiovascular:      Rate and Rhythm: Regular rhythm. Tachycardia present.     Pulses: Normal pulses.     Comments: 2+ radial and DP/PT pulses bilaterally, Homans sign present on the right, no lower extremity edema, no palpable cords, compartments are soft  Pulmonary:     Comments: Bibasilar Rales, right worse than left.  Tenderness to palpation of the right lateral and posterior chest wall with no deformity, crepitus, ecchymosis, or flail segment. Abdominal:     General: Bowel sounds are normal. There is no distension.     Palpations: Abdomen is soft.     Tenderness: There is no abdominal tenderness. There is no right CVA tenderness, left CVA tenderness, guarding or rebound.  Skin:    General: Skin is warm and dry.     Findings: No erythema.  Neurological:     Mental Status: She is alert.  Psychiatric:        Behavior: Behavior normal.      ED Treatments / Results  Labs (all labs ordered are listed, but only abnormal results are displayed) Labs Reviewed  BASIC METABOLIC PANEL - Abnormal; Notable for the following components:      Result Value   Glucose, Bld 334 (*)    All other components within normal limits  CBC WITH DIFFERENTIAL/PLATELET - Abnormal; Notable for the following components:   Hemoglobin 11.7 (*)    MCV 78.5 (*)    MCH 24.7 (*)    Neutro Abs 8.8 (*)    All other components within normal limits  TROPONIN I (HIGH SENSITIVITY) - Abnormal; Notable for the following components:   Troponin I (High Sensitivity) 42 (*)    All other components within normal limits  TROPONIN I (HIGH SENSITIVITY) - Abnormal; Notable for the following components:   Troponin I (High Sensitivity) 44 (*)    All other components within normal limits  D-DIMER, QUANTITATIVE (NOT AT [facility])  I-STAT BETA HCG BLOOD, ED ([facility], [facility], [facility] ONLY)    EKG EKG Interpretation  Date/Time:  Monday April 13 2019 14:14:08 EDT Ventricular Rate:  96 PR Interval:    QRS Duration: 102 QT Interval:  380 QTC Calculation: 478 R  Axis:   150 Text Interpretation:  Sinus arrhythmia Probable right ventricular hypertrophy Borderline T abnormalities, lateral leads flipped t wave in v6 and v5 seen on prior Otherwise no significant change Confirmed by Deno Etienne [phone]) on 04/13/2019 2:31:08 PM   Radiology Dg Chest 2 View  Result Date: 04/13/2019 CLINICAL DATA:  sob, cp. Pt stated that she's been experiencing a dull middle chest pain for "a few weeks". Pt also stated that she's been experiencing an intermittent cough for the same period. EXAM: CHEST - 2 VIEW COMPARISON:  12/11/2018 chest x-ray and chest CT FINDINGS: The cardiopericardial silhouette is markedly enlarged but stable, consistent with history of pericardial effusion. Mild interstitial markings consistent with interstitial pulmonary edema. No focal consolidations. IMPRESSION: 1. Stable enlargement of the cardiopericardial silhouette consistent with history of pericardial effusion. 2. Interstitial pulmonary edema. Electronically Signed   By: Nolon Nations M.D.   On: 04/13/2019 15:45   Vas [place] Lower Extremity Venous (dvt) (only [facility] 7a-7p)  Result Date: 04/13/2019  Lower Venous Study Indications: Pain.  Limitations: Body habitus. Comparison Study:  no prior Performing Technologist: June Leap RDMS, RVT  Examination Guidelines: A complete evaluation includes B-mode imaging, spectral Doppler, color Doppler, and power Doppler as needed of all accessible portions of each vessel. Bilateral testing is considered an integral part of a complete examination. Limited examinations for reoccurring indications may be performed as noted.  +---------+---------------+---------+-----------+----------+--------------+  RIGHT     Compressibility Phasicity Spontaneity Properties Thrombus Aging  +---------+---------------+---------+-----------+----------+--------------+  CFV       Full            Yes       Yes                                     +---------+---------------+---------+-----------+----------+--------------+  SFJ       Full                                                             +---------+---------------+---------+-----------+----------+--------------+  FV Prox   Full                                                             +---------+---------------+---------+-----------+----------+--------------+  FV Mid    Full                                                             +---------+---------------+---------+-----------+----------+--------------+  FV Distal Full                                                             +---------+---------------+---------+-----------+----------+--------------+  PFV       Full                                                             +---------+---------------+---------+-----------+----------+--------------+  POP       Full            Yes       Yes                                    +---------+---------------+---------+-----------+----------+--------------+  PTV       Full                                                             +---------+---------------+---------+-----------+----------+--------------+  PERO      Full                                                             +---------+---------------+---------+-----------+----------+--------------+     Summary: Right: There is no evidence of deep vein thrombosis in the lower extremity. No cystic structure found in the popliteal fossa.  *See table(s) above for measurements and observations. Electronically signed by Monica Martinez MD on 04/13/2019 at 6:11:30 PM.    Final     Procedures Procedures (including critical care time)  Medications Ordered in ED Medications  HYDROmorphone (DILAUDID) injection 1 mg (1 mg Intravenous Given 04/13/19 1641)  ondansetron (ZOFRAN) injection 4 mg (4 mg Intravenous Given 04/13/19 1641)  apixaban (ELIQUIS) tablet 5 mg (5 mg Oral Given 04/13/19 1826)     Initial Impression / Assessment and Plan / ED  Course  I have reviewed the triage vital signs and the nursing notes.  Pertinent labs & imaging results that were available during my care of the patient were reviewed by me and considered in my medical decision making (see chart for details).        Patient presents for evaluation of right-sided chest pains with exacerbation of chronic cough.  She is afebrile, initially tachycardic and hypertensive with improvement on subsequent reevaluations.  Does not appear to be in any respiratory distress.  Lab work reviewed by me shows no leukocytosis, mild stable anemia, no metabolic derangements, no renal insufficiency.  Her troponin is mildly elevated consistent with her baseline likely secondary to her chronic medical problems and serial troponins are unchanged so I doubt she is having an active MI.  Her EKG shows T wave inversions that have been present on previous EKGs and her symptoms are not suggestive of cardiac etiology.  D-dimer is negative and DVT study is negative so I have a low suspicion of PE.  Chest x-ray shows stable enlargement of the cardiopericardial silhouette consistent with history of pericardial effusion.  Chart review shows that this has been present for at least a year and has been evaluated by cardiology, and there does not appear to be any acute changes.  Low suspicion of cardiac tamponade, esophageal rupture, pneumothorax, or pneumonia.  Abdomen is soft and nontender, low suspicion of acute surgical abdominal pathology.  Her chest pain is reproducible on palpation so question some degree of musculoskeletal etiology.  On reevaluation patient resting comfortably in no apparent distress.  She is requesting a dose of her Eliquis here as she ran out this morning and cannot go to her pharmacy for refill until tomorrow.  We discussed conservative therapy and management with lidocaine patches, muscle relaxers, Tylenol.  Advised of appropriate use of Flexeril.  Recommend follow-up with PCP for  reevaluation of symptoms.  Discussed strict ED return precautions. Patient verbalized understanding of and agreement with plan and is safe for discharge home at this time.  Final Clinical Impressions(s) / ED Diagnoses   Final diagnoses:  Atypical chest pain  SOB (shortness of breath)  Cough    ED Discharge Orders         Ordered    lidocaine (LIDODERM) 5 %  Every 24 hours     04/13/19 1825    cyclobenzaprine (FLEXERIL) 10 MG tablet  2 times daily PRN  04/13/19 1825           Renita Papa, PA-C 04/15/19 [place], DO 04/15/19 1505

## 2019-04-13 NOTE — ED Triage Notes (Signed)
Pt reports a cough and right sided flank pain. Pt reports being evaluated by her PCP and an emergency department for same, getting better, but now everything is coming back.

## 2019-04-13 NOTE — Discharge Instructions (Signed)
Continue to take your medications as prescribed.  You can take Flexeril up to 3 times a day as needed for muscle relaxation but do not drive, drink alcohol, operate heavy machinery while taking this medication as it can make you drowsy.  You can apply lidocaine patches to areas of pain.  You can apply ice or heat, whichever feels best, 20 minutes at a time 3 or 4 times daily. Do some gentle stretching to avoid muscle stiffness.  Call your primary care provider to follow-up.  Return to the emergency department any concerning signs or symptoms develop such as worsening shortness of breath, severe chest pains, high fevers, or loss of consciousness.

## 2019-04-13 NOTE — Progress Notes (Signed)
RLE venous duplex       has been completed. Preliminary results can be found under CV proc through chart review. Calvin Jablonowski, BS, RDMS, RVT   

## 2019-04-13 NOTE — Progress Notes (Deleted)
History of Present Illness [name] is a 43 year old female patient with long-standing SLE, hypertension, type 2 diabetes mellitus, nonobstructive CAD, moderate aortic stenosis,chronicpericardial effusion without tampoande physiology (cath 03/2018), recurrent atrial flutter, now status post ablation 05/2018. She was seen as an inpatient by the Pulmonary Critical Care service.  Hospital Admission 12/08/2018-12/12/2018 for ILD flare.   04/13/2019  Test Results: HRCT 09/2018 Spectrum of findings compatible with fibrotic interstitial lung disease with mild basilar predominance, with progression since 2011 chest CT. No frank honeycombing. Differential includes usual interstitial pneumonia (UIP) or fibrotic phase nonspecific interstitial pneumonia (NSIP). Findings are categorized as probable UIP per consensus guidelines: Diagnosis of Idiopathic Pulmonary Fibrosis: An Official ATS/ERS/JRS/ALAT Clinical Practice Guideline. [name], Iss 5, [phone], Apr 13 2017. Moderate pericardial effusion. Three-vessel coronary atherosclerosis. Chronic stable bilateral axillary and mediastinal lymphadenopathy, most compatible with benign etiology.  Labs 08/26/2018 >> ds DNA Ab: > 2 IU/mL 08/26/2018 >>C3 Complement 103 mg/ dL 08/26/2018 >> C4 Complement 24 mg/dL   EKG05/08/2018: Sinus rhythm, aberrant conducted PAC's  R/LHC, coronary angiography and intervention 12/12/2018: RA: 10 mmHg RV: 46/7 mmHg. RVEDP 8 mmHg PA: 48/22 mmHg. Mean PA 33 mmHg PW: 17 mmHg LV 155/7 mmHg. LVEDP 10 mmHg <10 mmHg LV-Ao pullback gradient CO 7.8 L/min. CI 4 L/min/m2  Simultaneous RV-LV pressure tracings do not show interdependence to suggest constriction physiology.  LM: Wall calcium with no significant stenosis LAD: Ostial-proximal 90% stenosis. Normal diagonal branches. Successful PTCA and stent placement pLAD Synergy DES 3.5X16 mm DES Post dilatation with 4.0 X 8 mm [name]  balloon. 0% residual stenosis Ramus: Normal LCx: Normal RCA: Normal  Echocardiogram 12/10/2018: 1. The left ventricle has low normal systolic function, with an ejection fraction of 50-55%. No evidence of left ventricular regional wall motion abnormalities. 2. Moderate posteriorly located pericardial effusion with no signs of tamponade. 3. This is a limited study with no significant change compared to previous study on 11/20/2018.  CBC Latest Ref Rng & Units 03/05/2019 12/12/2018 12/12/2018  WBC 3.4 - 10.8 x10E3/uL 6.0 - -  Hemoglobin 11.1 - 15.9 g/dL 11.0(L) 10.9(L) 10.9(L)  Hematocrit 34.0 - 46.6 % 35.7 32.0(L) 32.0(L)  Platelets 150 - 450 x10E3/uL 351 - -    BMP Latest Ref Rng & Units 03/05/2019 12/12/2018 12/12/2018  Glucose 65 - 99 mg/dL 183(H) - -  BUN 6 - 24 mg/dL 12 - -  Creatinine 0.57 - 1.00 mg/dL 1.07(H) - -  BUN/Creat Ratio 9 - 23 11 - -  Sodium 134 - 144 mmol/L 142 142 142  Potassium 3.5 - 5.2 mmol/L 4.1 3.4(L) 3.4(L)  Chloride 96 - 106 mmol/L 103 - -  CO2 20 - 29 mmol/L 22 - -  Calcium 8.7 - 10.2 mg/dL 8.9 - -    BNP    Component Value Date/Time   BNP 351.5 (H) 12/08/2018 1526    ProBNP    Component Value Date/Time   PROBNP 69.0 08/18/2008 1430    PFT No results found for: FEV1PRE, FEV1POST, FVCPRE, FVCPOST, TLC, DLCOUNC, PREFEV1FVCRT, PSTFEV1FVCRT  No results found.   Past medical hx Past Medical History:  Diagnosis Date  . Abnormal Pap smear of cervix    colpo, HPV  . Allergy   . Anemia   . Atrial fibrillation ([place])   . CHF (congestive heart failure) ([place])   . Depression    after losses  . Enlarged heart    managed by cardiology  . Fibroid   .  Gestational diabetes   . Heart murmur   . Hypertension   . Infection    UTI  . Lupus ([place]) 1999     Social History   Tobacco Use  . Smoking status: Never Smoker  . Smokeless tobacco: Never Used  Substance Use Topics  . Alcohol use: Yes    Comment: ocasionally   . Drug use: No     [name] reports that she has never smoked. She has never used smokeless tobacco. She reports current alcohol use. She reports that she does not use drugs.  Tobacco Cessation: Counseling given: Not Answered   Past surgical hx, Family hx, Social hx all reviewed.  Current Outpatient Medications on File Prior to Visit  Medication Sig  . Accu-Chek FastClix Lancets MISC Use as directed three times daily  . acetaminophen (TYLENOL) 500 MG tablet Take 1,000 mg by mouth every 6 (six) hours as needed for headache (pain).  Marland Kitchen albuterol (PROVENTIL HFA;VENTOLIN HFA) 108 (90 Base) MCG/ACT inhaler Inhale 2 puffs into the lungs every 6 (six) hours as needed for wheezing or shortness of breath.  Marland Kitchen apixaban (ELIQUIS) 5 MG TABS tablet Take 1 tablet (5 mg total) by mouth 2 (two) times daily.  Marland Kitchen azaTHIOprine (IMURAN) 50 MG tablet Take 50 mg by mouth 2 (two) times daily.  . benzonatate (TESSALON) 200 MG capsule Take 1 capsule (200 mg total) by mouth 3 (three) times daily as needed for cough.  . Blood Glucose Monitoring Suppl (ACCU-CHEK GUIDE) w/Device KIT 1 each by Does not apply route 3 (three) times daily.  . cetirizine (ZYRTEC) 10 MG tablet Take 1 tablet (10 mg total) by mouth daily.  . clopidogrel (PLAVIX) 75 MG tablet Take 1 tablet (75 mg total) by mouth daily with breakfast.  . colchicine 0.6 MG tablet Take 1 tablet (0.6 mg total) by mouth daily.  Marland Kitchen diltiazem (CARDIZEM CD) 240 MG 24 hr capsule Take 1 capsule (240 mg total) by mouth daily.  . furosemide (LASIX) 40 MG tablet TAKE 1 TABLET (40 MG TOTAL) BY MOUTH 2 (TWO) TIMES DAILY.  Marland Kitchen glucose blood (ACCU-CHEK GUIDE) test strip Use as directed three times daily  . hydroxychloroquine (PLAQUENIL) 200 MG tablet Take 1 tablet (200 mg total) by mouth 2 (two) times daily.  . Insulin Glargine (BASAGLAR KWIKPEN) 100 UNIT/ML SOPN Inject 0.2 mLs (20 Units total) into the skin daily. Patient has been taking 16 units daily  . Insulin Pen Needle (TRUEPLUS PEN NEEDLES)  32G X 4 MM MISC Use as directed to inject insulin once daily  . losartan (COZAAR) 50 MG tablet Take 1 tablet (50 mg total) by mouth daily.  . medroxyPROGESTERone (DEPO-PROVERA) 150 MG/ML injection Inject 150 mg into the muscle every 3 (three) months.  . metFORMIN (GLUCOPHAGE) 1000 MG tablet Take 1 tablet (1,000 mg total) by mouth 2 (two) times daily with a meal.  . methocarbamol (ROBAXIN) 500 MG tablet Take 2 tablets (1,000 mg total) by mouth every 8 (eight) hours as needed for muscle spasms.  Marland Kitchen omeprazole (PRILOSEC) 20 MG capsule Take 1 capsule (20 mg total) by mouth daily as needed (for heartburn or GERD-like symptoms). (Patient taking differently: Take 20 mg by mouth daily. )  . Phenyleph-CPM-DM-Aspirin (ALKA-SELTZER PLUS COLD & COUGH PO) Take 2 tablets by mouth every 4 (four) hours as needed (cough/congestion).  . potassium chloride SA (K-DUR) 20 MEQ tablet Take 2 tablets (40 mEq total) by mouth daily.  . predniSONE (DELTASONE) 5 MG tablet Take 1.5 tablets (7.5 mg total)  by mouth daily with breakfast.  . rosuvastatin (CRESTOR) 20 MG tablet Take 1 tablet (20 mg total) by mouth daily at 6 PM.  . traMADol (ULTRAM) 50 MG tablet Take 1 tablet by mouth as needed.   No current facility-administered medications on file prior to visit.      Allergies  Allergen Reactions  . Celery Oil Shortness Of Breath, Itching, Rash and Other (See Comments)    Bumps on tongue, also  . Peanut-Containing Drug Products Anaphylaxis, Shortness Of Breath, Itching, Rash and Other (See Comments)    Bumps on tongue, also  . Septra [Bactrim] Hives    Review Of Systems:  Constitutional:   No  weight loss, night sweats,  Fevers, chills, fatigue, or  lassitude.  HEENT:   No headaches,  Difficulty swallowing,  Tooth/dental problems, or  Sore throat,                No sneezing, itching, ear ache, nasal congestion, post nasal drip,   CV:  No chest pain,  Orthopnea, PND, swelling in lower extremities, anasarca, dizziness,  palpitations, syncope.   GI  No heartburn, indigestion, abdominal pain, nausea, vomiting, diarrhea, change in bowel habits, loss of appetite, bloody stools.   Resp: No shortness of breath with exertion or at rest.  No excess mucus, no productive cough,  No non-productive cough,  No coughing up of blood.  No change in color of mucus.  No wheezing.  No chest wall deformity  Skin: no rash or lesions.  GU: no dysuria, change in color of urine, no urgency or frequency.  No flank pain, no hematuria   MS:  No joint pain or swelling.  No decreased range of motion.  No back pain.  Psych:  No change in mood or affect. No depression or anxiety.  No memory loss.   Vital Signs There were no vitals taken for this visit.   Physical Exam:  General- No distress,  A&Ox3 ENT: No sinus tenderness, TM clear, pale nasal mucosa, no oral exudate,no post nasal drip, no LAN Cardiac: S1, S2, regular rate and rhythm, no murmur Chest: No wheeze/ rales/ dullness; no accessory muscle use, no nasal flaring, no sternal retractions Abd.: Soft Non-tender Ext: No clubbing cyanosis, edema Neuro:  normal strength Skin: No rashes, warm and dry Psych: normal mood and behavior   Assessment/Plan  No problem-specific Assessment & Plan notes found for this encounter.    Magdalen Spatz, NP 04/13/2019  9:15 AM

## 2019-04-14 MED FILL — CYCLOBENZAPRINE 10 MG TAB: 10 | 5 days supply | Qty: 10 | Fill #0

## 2019-04-14 MED FILL — FUROSEMIDE 40 MG TAB: 40 | 30 days supply | Qty: 60 | Fill #1

## 2019-04-14 MED FILL — ELIQUIS 5 MG TABLET: 5 | 30 days supply | Qty: 60 | Fill #2

## 2019-04-14 MED FILL — LIDOCAINE 5 % PTCH: 5 | 30 days supply | Qty: 30 | Fill #0

## 2019-04-15 MED FILL — traMADol HCL 50 MG TABS: 50 | 7 days supply | Qty: 14 | Fill #6

## 2019-04-23 MED FILL — ?ROSUVASTATIN CALCIUM 20MG: 20 | 30 days supply | Qty: 30 | Fill #4

## 2019-04-23 MED FILL — traMADol HCL 50 MG TABS: 50 | 7 days supply | Qty: 14 | Fill #7

## 2019-04-30 ENCOUNTER — Telehealth (INDEPENDENT_AMBULATORY_CARE_PROVIDER_SITE_OTHER): Payer: 59 | Admitting: General Practice

## 2019-04-30 DIAGNOSIS — N898 Other specified noninflammatory disorders of vagina: Secondary | ICD-10-CM

## 2019-04-30 NOTE — Telephone Encounter (Signed)
Patient called and left message on nurse voicemail line stating she is having discomfort and requests a call back.

## 2019-05-01 MED FILL — traMADol HCL 50 MG TABS: 50 | 4 days supply | Qty: 8 | Fill #8

## 2019-05-01 MED FILL — ?METOPROLOL 50 MG TABLET: 50 | 30 days supply | Qty: 60 | Fill #4

## 2019-05-04 ENCOUNTER — Other Ambulatory Visit: Payer: Self-pay

## 2019-05-04 MED FILL — CYCLOBENZAPRINE 5 MG TABLET: 5 | 30 days supply | Qty: 30 | Fill #0

## 2019-05-05 MED ORDER — METRONIDAZOLE 500 MG PO TABS: 500.0000 mg | ORAL_TABLET | Freq: Two times a day (BID) | ORAL | 0 refills | Status: DC

## 2019-05-05 NOTE — Telephone Encounter (Signed)
Called pt and she reports she is having some itching and clear vaginal discharge. She is noting an odor to the discharge. She reports she took a bath with a new bath bomb. Pt has been treated for BV in the past. She reports she had to stop using douches due to recurrent infections. Pt is not pregnant and has not been having sex. Flagyl sent in for pt with the understanding that if she does not improve that she needs to come in for a swab. Pt voiced understanding. Sent to [facility] on [address] at patients request.

## 2019-05-07 ENCOUNTER — Other Ambulatory Visit: Payer: Self-pay

## 2019-05-07 DIAGNOSIS — I10 Essential (primary) hypertension: Secondary | ICD-10-CM

## 2019-05-07 MED ORDER — LOSARTAN POTASSIUM 50 MG PO TABS: 50.0000 mg | ORAL_TABLET | Freq: Every day | ORAL | 3 refills | Status: DC

## 2019-05-07 MED ORDER — DILTIAZEM HCL ER COATED BEADS 240 MG PO CP24: 240.0000 mg | ORAL_CAPSULE | Freq: Every day | ORAL | 2 refills | Status: DC

## 2019-05-09 ENCOUNTER — Other Ambulatory Visit (HOSPITAL_COMMUNITY)
Admission: RE | Admit: 2019-05-09 | Discharge: 2019-05-09 | Disposition: A | Discharge status: Home / Self Care | Payer: 59 | Source: Ambulatory Visit | Attending: Acute Care | Admitting: Acute Care

## 2019-05-09 DIAGNOSIS — Z20828 Contact with and (suspected) exposure to other viral communicable diseases: Secondary | ICD-10-CM | POA: Insufficient documentation

## 2019-05-09 DIAGNOSIS — Z01812 Encounter for preprocedural laboratory examination: Secondary | ICD-10-CM | POA: Insufficient documentation

## 2019-05-10 LAB — NOVEL CORONAVIRUS, NAA (HOSP ORDER, SEND-OUT TO REF LAB; TAT 18-24 HRS): SARS-CoV-2, NAA: NOT DETECTED

## 2019-05-12 ENCOUNTER — Ambulatory Visit (INDEPENDENT_AMBULATORY_CARE_PROVIDER_SITE_OTHER): Payer: 59 | Admitting: Internal Medicine

## 2019-05-12 ENCOUNTER — Ambulatory Visit: Payer: 59

## 2019-05-12 ENCOUNTER — Other Ambulatory Visit: Payer: Self-pay

## 2019-05-12 DIAGNOSIS — J849 Interstitial pulmonary disease, unspecified: Secondary | ICD-10-CM | POA: Diagnosis not present

## 2019-05-12 LAB — PULMONARY FUNCTION TEST
DL/VA % pred: 93 %
DL/VA: 4.24 ml/min/mmHg/L
DLCO unc % pred: 58 %
DLCO unc: 10.98 ml/min/mmHg
FEF 25-75 Post: 2.8 L/sec
FEF 25-75 Pre: 2.51 L/sec
FEF2575-%Change-Post: 11 %
FEF2575-%Pred-Post: 114 %
FEF2575-%Pred-Pre: 102 %
FEV1-%Change-Post: 3 %
FEV1-%Pred-Post: 72 %
FEV1-%Pred-Pre: 69 %
FEV1-Post: 1.54 L
FEV1-Pre: 1.48 L
FEV1FVC-%Change-Post: 1 %
FEV1FVC-%Pred-Pre: 108 %
FEV6-%Change-Post: 2 %
FEV6-%Pred-Post: 66 %
FEV6-%Pred-Pre: 65 %
FEV6-Post: 1.69 L
FEV6-Pre: 1.65 L
FEV6FVC-%Pred-Post: 102 %
FEV6FVC-%Pred-Pre: 102 %
FVC-%Change-Post: 2 %
FVC-%Pred-Post: 65 %
FVC-%Pred-Pre: 63 %
FVC-Post: 1.69 L
FVC-Pre: 1.65 L
Post FEV1/FVC ratio: 91 %
Post FEV6/FVC ratio: 100 %
Pre FEV1/FVC ratio: 90 %
Pre FEV6/FVC Ratio: 100 %
RV % pred: 72 %
RV: 1.04 L
TLC % pred: 61 %
TLC: 2.72 L

## 2019-05-12 NOTE — Progress Notes (Signed)
PFT done today. 

## 2019-05-13 ENCOUNTER — Ambulatory Visit: Payer: 59

## 2019-05-13 ENCOUNTER — Ambulatory Visit (INDEPENDENT_AMBULATORY_CARE_PROVIDER_SITE_OTHER): Payer: 59 | Admitting: Acute Care

## 2019-05-13 ENCOUNTER — Encounter: Payer: Self-pay | Admitting: Acute Care

## 2019-05-13 ENCOUNTER — Ambulatory Visit (INDEPENDENT_AMBULATORY_CARE_PROVIDER_SITE_OTHER): Payer: 59

## 2019-05-13 VITALS — BP 128/74 | HR 109 | Temp 97.1°F | Ht 60.0 in | Wt 239.6 lb

## 2019-05-13 DIAGNOSIS — Z23 Encounter for immunization: Secondary | ICD-10-CM | POA: Diagnosis not present

## 2019-05-13 DIAGNOSIS — I2510 Atherosclerotic heart disease of native coronary artery without angina pectoris: Secondary | ICD-10-CM

## 2019-05-13 DIAGNOSIS — E1165 Type 2 diabetes mellitus with hyperglycemia: Secondary | ICD-10-CM

## 2019-05-13 DIAGNOSIS — J849 Interstitial pulmonary disease, unspecified: Secondary | ICD-10-CM

## 2019-05-13 DIAGNOSIS — M329 Systemic lupus erythematosus, unspecified: Secondary | ICD-10-CM | POA: Diagnosis not present

## 2019-05-13 DIAGNOSIS — R0602 Shortness of breath: Secondary | ICD-10-CM

## 2019-05-13 DIAGNOSIS — I4892 Unspecified atrial flutter: Secondary | ICD-10-CM

## 2019-05-13 DIAGNOSIS — IMO0002 Reserved for concepts with insufficient information to code with codable children: Secondary | ICD-10-CM

## 2019-05-13 NOTE — Patient Instructions (Addendum)
Our goal is to wean you off as prednisone can be hard on  your bones and for your blood sugars.  We will not restart prednisone it as you have been off it for a week and are doing ok. Call us if this changes  Continue using your rescue inhaler as needed for shortness of breath or wheezing. We will schedule an HRCT to re-evaluate your ILD. ( Prone and supine , inspiratory and expiratory cuts) We will have you complete the ILD questionnaire Flu vaccine today Continue taking Lasix daily as prescribed. Continue Eliquis daiy Continue Plaquenil as prescribed Continue Imuran as prescribed Continue Zyrtec and Prilosec. Follow up with Dr. Chase Caller in 1 month Follow instruction given in the ED for back pain Please contact office for sooner follow up if symptoms do not improve or worsen or seek emergency care

## 2019-05-13 NOTE — Progress Notes (Addendum)
History of Present Illness [name] is a 43 y.o. female with with long-standing SLE, hypertension, type 2 diabetes mellitus, nonobstructive CAD, moderate aortic stenosis,chronicpericardial effusion without tampoande physiology (cath 03/2018), recurrent atrial flutter, now status post ablation 05/2018. She was seen as an inpatient by the Pulmonary Critical Care service during recent hospitalization 11/2018.Marland Kitchen  Hospital Admission 12/08/2018-12/12/2018 for ILD flare.  05/14/2019  Pt. Presents for follow up. She was last seen in the office 03/09/2019. At that time we were continuing prednisone at 7.5 mg daily. We were scheduling PFT, and a 6 minute walk.We repeated HRCT to evaluate for progression of disease. We had her fill out a pulmonary fibrosis questionnaire. She was seen at the ED 04/13/2019 for atypical chest pain. She was ruled out for MI. She was advised on conservative therapy and management with lidocaine patches, muscle relaxers, Tylenol.  Advised of appropriate use of Flexeril.    She presents today with continued shortness of breath with exertion but she states that her cough has improved. She states that she has been out of her prednisone for 10 days and she did not call for refill. She states she wanted to see how she did off the medication. She had been on 20 mg daily and we had weaned her to 7.5 mg daily. She states her cough is much better. She does have back pain. She was recently diagnosed with DDD. She also has diabetes and her blood sugars have been slightly elevated on the prednisone . She is was 211 today. She is trying to diet and eliminate sugars.  She has been off her prednisone for 10 days.As she is doing well, and cough has resolved we will not restart prednisone at this time. She knows to call us if she has a sudden worsening in her respiratory status.She denies fever, chest pain, orthopnea or hemoptysis.    Of note, she no longer sleeps with a feather pillow.  Test  Results: PFT's 05/12/2019 FVC (L) 1.65 2.60 63 1.69 65 +2 FEV1 (L) 1.48 2.13 69 1.54 72 +3 FEV1/FVC (%) 90 83 108 91 109 +1 FEV6 (L) 1.65 2.53 65 1.69 66 +2 FEV1/FEV6 (%) 90 85 106 90 106 +0 FEF 25-75% (L/sec) 2.51 2.44 102 2.80 114 +11 FEF Max (L/sec) 4.71 5.87 80 4.81 81 +2 FIVC (L) 1.63 1.75 +7 FIF Max (L/sec) 3.01 2.37 -21 ---- LUNG VOLUMES ---- SVC (L) 1.68 2.60 64 IC (L) 1.53 2.02 75 RV (Pleth) (L) 1.04 1.45 72 TLC (Pleth) (L) 2.72 4.45 61 RV/TLC ([place]th) (%) 38 32 118 ERV (L) 0.15 0.58 26 ---- DIFFUSION ---- DLCOunc (ml/min/mmHg) 10.98 18.81 58 DLCOcor (ml/min/mmHg) 18.81 DL/VA (ml/min/mmHg/L) 4.24 4.53 93 VA (L) 2.59 4.15 62 ---- AIRWAYS RESISTANCE -- Raw (cmH2O/L/s) 1.25 1.86 67 Gaw (L/s/cmH2O) 0.80 1.03 78 sRaw (cmH2O*s) 1.98 < 4.76 sGaw (1/cmH2O*s) 0.51 0.20 247  Restriction with moderate diffusion deficit  HRCT 09/2018 Spectrum of findings compatible with fibrotic interstitial lung disease with mild basilar predominance, with progression since 2011 chest CT. No frank honeycombing. Differential includes usual interstitial pneumonia (UIP) or fibrotic phase nonspecific interstitial pneumonia (NSIP). Findings are categorized as probable UIP per consensus guidelines: Diagnosis of Idiopathic Pulmonary Fibrosis: An Official ATS/ERS/JRS/ALAT Clinical Practice Guideline. [place], Iss 5, [id number], Apr 13 2017. Moderate pericardial effusion. Three-vessel coronary atherosclerosis. Chronic stable bilateral axillary and mediastinal lymphadenopathy, most compatible with benign etiology.  Labs 08/26/2018 >> ds DNA Ab: > 2 IU/mL 08/26/2018 >>C3 Complement 103 mg/  dL 08/26/2018 >> C4 Complement 24 mg/dL   EKG05/08/2018: Sinus rhythm, aberrant conducted PAC's  R/LHC, coronary angiography and intervention 12/12/2018: RA: 10 mmHg RV: 46/7 mmHg. RVEDP 8 mmHg PA: 48/22 mmHg. Mean PA 33 mmHg PW: 17 mmHg LV 155/7 mmHg. LVEDP 10 mmHg <10 mmHg  LV-Ao pullback gradient CO 7.8 L/min. CI 4 L/min/m2  Simultaneous RV-LV pressure tracings do not show interdependence to suggest constriction physiology.  LM: Wall calcium with no significant stenosis LAD: Ostial-proximal 90% stenosis. Normal diagonal branches. Successful PTCA and stent placement pLAD Synergy DES 3.5X16 mm DES Post dilatation with 4.0 X 8 mm [place] balloon. 0% residual stenosis Ramus: Normal LCx: Normal RCA: Normal  Echocardiogram 12/10/2018: 1. The left ventricle has low normal systolic function, with an ejection fraction of 50-55%. No evidence of left ventricular regional wall motion abnormalities. 2. Moderate posteriorly located pericardial effusion with no signs of tamponade. 3. This is a limited study with no significant change compared to previous study on 11/20/2018.  CBC Latest Ref Rng & Units 04/13/2019 03/05/2019 12/12/2018  WBC 4.0 - 10.5 K/uL 10.5 6.0 -  Hemoglobin 12.0 - 15.0 g/dL 11.7(L) 11.0(L) 10.9(L)  Hematocrit 36.0 - 46.0 % 37.2 35.7 32.0(L)  Platelets 150 - 400 K/uL 288 351 -    BMP Latest Ref Rng & Units 04/13/2019 03/05/2019 12/12/2018  Glucose 70 - 99 mg/dL 334(H) 183(H) -  BUN 6 - 20 mg/dL 9 12 -  Creatinine 0.44 - 1.00 mg/dL 0.94 1.07(H) -  BUN/Creat Ratio 9 - 23 - 11 -  Sodium 135 - 145 mmol/L 137 142 142  Potassium 3.5 - 5.1 mmol/L 3.7 4.1 3.4(L)  Chloride 98 - 111 mmol/L 104 103 -  CO2 22 - 32 mmol/L 23 22 -  Calcium 8.9 - 10.3 mg/dL 8.9 8.9 -    BNP    Component Value Date/Time   BNP 351.5 (H) 12/08/2018 1526    ProBNP    Component Value Date/Time   PROBNP 69.0 08/18/2008 1430    PFT    Component Value Date/Time   FEV1PRE 1.48 05/12/2019 0852   FEV1POST 1.54 05/12/2019 0852   FVCPRE 1.65 05/12/2019 0852   FVCPOST 1.69 05/12/2019 0852   TLC 2.72 05/12/2019 0852   DLCOUNC 10.98 05/12/2019 0852   PREFEV1FVCRT 90 05/12/2019 0852   PSTFEV1FVCRT 91 05/12/2019 0852    No results found.   Past medical hx Past Medical  History:  Diagnosis Date  . Abnormal Pap smear of cervix    colpo, HPV  . Allergy   . Anemia   . Atrial fibrillation ([place])   . CHF (congestive heart failure) ([place])   . Depression    after losses  . Enlarged heart    managed by cardiology  . Fibroid   . Gestational diabetes   . Heart murmur   . Hypertension   . Infection    UTI  . Lupus ([place]) 1999     Social History   Tobacco Use  . Smoking status: Never Smoker  . Smokeless tobacco: Never Used  Substance Use Topics  . Alcohol use: Yes    Comment: ocasionally   . Drug use: No    Ms.Dermody reports that she has never smoked. She has never used smokeless tobacco. She reports current alcohol use. She reports that she does not use drugs.  Tobacco Cessation: Never smoker  Past surgical hx, Family hx, Social hx all reviewed.  Current Outpatient Medications on File Prior to Visit  Medication Sig  .  Accu-Chek FastClix Lancets MISC Use as directed three times daily  . acetaminophen (TYLENOL) 500 MG tablet Take 1,000 mg by mouth every 6 (six) hours as needed for headache (pain).  Marland Kitchen albuterol (PROVENTIL HFA;VENTOLIN HFA) 108 (90 Base) MCG/ACT inhaler Inhale 2 puffs into the lungs every 6 (six) hours as needed for wheezing or shortness of breath.  Marland Kitchen apixaban (ELIQUIS) 5 MG TABS tablet Take 1 tablet (5 mg total) by mouth 2 (two) times daily.  Marland Kitchen azaTHIOprine (IMURAN) 50 MG tablet Take 50 mg by mouth 2 (two) times daily.  . benzonatate (TESSALON) 200 MG capsule Take 1 capsule (200 mg total) by mouth 3 (three) times daily as needed for cough.  . Blood Glucose Monitoring Suppl (ACCU-CHEK GUIDE) w/Device KIT 1 each by Does not apply route 3 (three) times daily.  . clopidogrel (PLAVIX) 75 MG tablet Take 1 tablet (75 mg total) by mouth daily with breakfast.  . colchicine 0.6 MG tablet Take 1 tablet (0.6 mg total) by mouth daily.  . cyclobenzaprine (FLEXERIL) 10 MG tablet Take 1 tablet (10 mg total) by mouth 2 (two) times daily as needed.   . diltiazem (CARDIZEM CD) 240 MG 24 hr capsule Take 1 capsule (240 mg total) by mouth daily.  . furosemide (LASIX) 40 MG tablet TAKE 1 TABLET (40 MG TOTAL) BY MOUTH 2 (TWO) TIMES DAILY.  Marland Kitchen glucose blood (ACCU-CHEK GUIDE) test strip Use as directed three times daily  . hydroxychloroquine (PLAQUENIL) 200 MG tablet Take 1 tablet (200 mg total) by mouth 2 (two) times daily.  . Insulin Glargine (BASAGLAR KWIKPEN) 100 UNIT/ML SOPN Inject 0.2 mLs (20 Units total) into the skin daily. Patient has been taking 16 units daily  . Insulin Pen Needle (TRUEPLUS PEN NEEDLES) 32G X 4 MM MISC Use as directed to inject insulin once daily  . lidocaine (LIDODERM) 5 % Place 1 patch onto the skin daily. Remove & Discard patch within 12 hours or as directed by MD  . losartan (COZAAR) 50 MG tablet Take 1 tablet (50 mg total) by mouth daily.  . medroxyPROGESTERone (DEPO-PROVERA) 150 MG/ML injection Inject 150 mg into the muscle every 3 (three) months.  . metFORMIN (GLUCOPHAGE) 1000 MG tablet Take 1 tablet (1,000 mg total) by mouth 2 (two) times daily with a meal.  . methocarbamol (ROBAXIN) 500 MG tablet Take 2 tablets (1,000 mg total) by mouth every 8 (eight) hours as needed for muscle spasms.  . metroNIDAZOLE (FLAGYL) 500 MG tablet Take 1 tablet (500 mg total) by mouth 2 (two) times daily.  Marland Kitchen Phenyleph-CPM-DM-Aspirin (ALKA-SELTZER PLUS COLD & COUGH PO) Take 2 tablets by mouth every 4 (four) hours as needed (cough/congestion).  . potassium chloride SA (K-DUR) 20 MEQ tablet Take 2 tablets (40 mEq total) by mouth daily.  . predniSONE (DELTASONE) 5 MG tablet Take 1.5 tablets (7.5 mg total) by mouth daily with breakfast.  . rosuvastatin (CRESTOR) 20 MG tablet Take 1 tablet (20 mg total) by mouth daily at 6 PM.  . traMADol (ULTRAM) 50 MG tablet Take 1 tablet by mouth as needed.  . cetirizine (ZYRTEC) 10 MG tablet Take 1 tablet (10 mg total) by mouth daily.  Marland Kitchen omeprazole (PRILOSEC) 20 MG capsule Take 1 capsule (20 mg total) by  mouth daily as needed (for heartburn or GERD-like symptoms). (Patient taking differently: Take 20 mg by mouth daily. )   No current facility-administered medications on file prior to visit.      Allergies  Allergen Reactions  . Celery Oil  Shortness Of Breath, Itching, Rash and Other (See Comments)    Bumps on tongue, also  . Peanut-Containing Drug Products Anaphylaxis, Shortness Of Breath, Itching, Rash and Other (See Comments)    Bumps on tongue, also  . Septra [Bactrim] Hives    Review Of Systems:  Constitutional:   No  weight loss, night sweats,  Fevers, chills, fatigue, or  lassitude.  HEENT:   No headaches,  Difficulty swallowing,  Tooth/dental problems, or  Sore throat,                No sneezing, itching, ear ache, nasal congestion, post nasal drip,   CV:  No chest pain,  Orthopnea, PND, swelling in lower extremities, anasarca, dizziness, palpitations, syncope.   GI  No heartburn, indigestion, abdominal pain, nausea, vomiting, diarrhea, change in bowel habits, loss of appetite, bloody stools.   Resp: + shortness of breath with exertion less at rest.  No excess mucus, no productive cough, rare  non-productive cough,  No coughing up of blood.  No change in color of mucus.  No wheezing.  No chest wall deformity  Skin: no rash or lesions.  GU: no dysuria, change in color of urine, no urgency or frequency.  No flank pain, no hematuria   MS:  No joint pain or swelling.  No decreased range of motion.  No back pain.  Psych:  No change in mood or affect. No depression or anxiety.  No memory loss.   Vital Signs BP 128/74 (BP Location: Left Arm, Cuff Size: Large)   Pulse (!) 109   Temp (!) 97.1 F (36.2 C) (Temporal)   Ht 5' (1.524 m)   Wt 239 lb 9.6 oz (108.7 kg)   SpO2 95%   BMI 46.79 kg/m    Physical Exam:  General- No distress,  A&Ox3, pleasant ENT: No sinus tenderness, TM clear, pale nasal mucosa, no oral exudate,no post nasal drip, no LAN Cardiac: S1, S2, regular  rate and rhythm, no murmur Chest: No wheeze/ rales/ dullness; no accessory muscle use, no nasal flaring, no sternal retractions, crackles noted 3/4 of the way up bilaterally Abd.: Soft Non-tender, ND, BS +, Body mass index is 46.79 kg/m. Ext: No clubbing cyanosis, edema Neuro:  normal strength, MAE x 4, A&O x 3, appropriate Skin: No rashes, No lesions, warm and dry Psych: normal mood and behavior   Assessment/Plan  ILD (interstitial lung disease) (HCC) Progressive ILD per HRCT 09/2018 [facility] aggressive management Additional diagnosis of SLE Family history of sarcoid and lupus Used to sleep with feather pillow Difficulty weaning off prednisone May need bronch/ biopsy/ anti fibrotic therapy Plan: We need to treat your ILD and Lupus agressively Baseline 6 minute walk completed 9/30 Baseline PFT's done 9/29 indicate restriction with a  We have weaned you off prednisone Follow up  with Dr. Chase Caller if we need to consider bronch, biopsy and possible fibrotic therapy. Repeat your HRCT to evaluate for progression, as it has been 6 months since CT Follow up in 1 month with Dr. Chase Caller Please bring your  pulmonary fibrosis questionnaire to your next visit. If your symptoms return, please call the office Continue taking Lasix daily as prescribed. Continue Plaquenil as prescribed Continue Imuran as prescribed Continue Zyrtec and Prilosec. Please contact office for sooner follow up if symptoms do not improve or worsen or seek emergency care   Lupus [facility]) Cardiac involvement ? Pulmonary involvement vs ILDflareas cause of SOB and dry cough Plan IContinue taking Lasix daily as prescribed. Continue  Plaquenil and Imuran as prescribed Follow up with Dr. Pearline Cables Continue Zyrtec and Prilosec. Please contact office for sooner follow up if symptoms do not improve or worsen or seek emergency care    Coronary artery disease Proximal LAD lesion previously seen to be moderate on  coronary angiography in August 2019, appears to have progressed to a high-grade 90% lesion. Intervention to this lesion with placement of Synergy 3.5 x 16 mm drug-eluting stent with postdilatation with excellent results and no residual stenosis.  Patient did note improvement in shortness of breath symptoms after successful coronary revascularization.  Her resting tachycardia has also improved Plan: Continue  Aspirin/plavix/Eliquis per cards Then stop Aspirin, continue eliquis and plavix for 6 months. Per cards, if she needs lung biopsy in the near future, plavix could potentially be stopped after 3 months use ie in 03/2019.   Atrial flutter ([place]) Please continue  Eliquis as prescribed  Type 2 diabetes mellitus with hyperglycemia, with  long-term current use of insulin (HCC) Monitor blood sugars as you have been doing Plan is to keep you off prednisone for now Follow up with PCP for blood sugar management Please let us know if you have any issues.  Chronic prednisone Use Plan Need to consider DEX scan and Calcium and Vitamin D therapy at next [place] Flu shot 05/13/2019    Magdalen Spatz, NP 05/14/2019  10:17 AM

## 2019-05-14 ENCOUNTER — Encounter: Payer: Self-pay | Admitting: Acute Care

## 2019-05-15 ENCOUNTER — Other Ambulatory Visit: Payer: Self-pay | Admitting: Physician Assistant

## 2019-05-15 MED FILL — BASAGLAR 100 UNIT/ML KWIKPE: 100 | 30 days supply | Qty: 6 | Fill #0

## 2019-05-15 MED FILL — HYDROXYCHLOROQUINE SULFATE: 200 | 30 days supply | Qty: 60 | Fill #4

## 2019-05-15 MED FILL — FUROSEMIDE 40 MG TAB: 40 | 30 days supply | Qty: 60 | Fill #2

## 2019-05-15 MED FILL — ELIQUIS 5 MG TABLET: 5 | 30 days supply | Qty: 60 | Fill #3

## 2019-05-18 ENCOUNTER — Telehealth: Payer: Self-pay | Admitting: Internal Medicine

## 2019-05-18 MED ORDER — PREDNISONE 10 MG PO TABS: ORAL_TABLET | ORAL | 0 refills | Status: DC

## 2019-05-18 NOTE — Telephone Encounter (Signed)
Send in Prednisone taper; 10 mg tablets: 4 tabs x 2 days, 3 tabs x 2 days, 2 tabs x 2 days 1 tab x 2 days then stop. She will need a follow up video visit in a week to see how she is doing. She needs to monitor her blood sugars while on prednisone.

## 2019-05-18 NOTE — Telephone Encounter (Signed)
Spoke with pt. States that she is not feeling any better since seeing Judson Roch on 05/13/2019. Reports increased shortness of breath, chest tightness, wheezing and coughing. Cough is non productive at this time. Denies fever, chills, recent travel or sick contacts that she is aware of. Symptoms started over 1 week ago. Pt is requesting to having prednisone sent in.  Judson Roch - please advise. Thanks.

## 2019-05-18 NOTE — Telephone Encounter (Signed)
Spoke with pt. She is aware of Sarah's recommendations. Rx has been sent in. Video visit has been scheduled for 05/25/2019 at 0900. Nothing further was needed.

## 2019-05-25 ENCOUNTER — Telehealth (INDEPENDENT_AMBULATORY_CARE_PROVIDER_SITE_OTHER): Payer: 59 | Admitting: Acute Care

## 2019-05-25 ENCOUNTER — Encounter: Payer: Self-pay | Admitting: Acute Care

## 2019-05-25 DIAGNOSIS — J849 Interstitial pulmonary disease, unspecified: Secondary | ICD-10-CM | POA: Diagnosis not present

## 2019-05-25 MED ORDER — PREDNISONE 5 MG PO TABS: 5.0000 mg | ORAL_TABLET | Freq: Every day | ORAL | 0 refills | Status: DC

## 2019-05-25 NOTE — Patient Instructions (Addendum)
ILD responsive to prednisone Plan I'm sorry we were unable to get you off your prednisone completely. Continue the 7.5 mg of prednisone daily. Watch your blood sugars closely Follow up with PCP  About blood sugars We need to treat your ILD and Lupus agressively I have ordered  HRCT to evaluate for progression, as it has been 6 months since CT Follow upwith Dr. Chase Caller 10/29. I will see if we can get this moved forward. Please bring yourpulmonary fibrosis questionnaire to your next visit. Continue taking Lasix daily as prescribed. Continue Plaquenil as prescribed Continue Imuran as prescribed Continue Zyrtec and Prilosec. Please contact office for sooner follow up if symptoms do not improve or worsen or seek emergency care   Lupus [facility]) Cardiac involvement ? Pulmonary involvement vs ILDflareas cause of SOB and dry cough Plan Continue taking Lasix daily as prescribed. Continue Plaqueniland Imuranas prescribed Follow up with Dr. Pearline Cables 07/2019 Continue Zyrtec and Prilosec. Please contact office for sooner follow up if symptoms do not improve or worsen or seek emergency care    Coronary artery disease Proximal LAD lesion previously seen to be moderate on coronary angiography in August 2019, appears to have progressed to a high-grade 90% lesion. Intervention to this lesion with placement of Synergy 3.5 x 16 mm drug-eluting stent with postdilatation with excellent results and no residual stenosis.  Patient did note improvement in shortness of breath symptoms after successful coronary revascularization.  Her resting tachycardia has also improved Plan: ContinueAspirin/plavix/Eliquis per cards Then stop Aspirin, continue eliquis and plavix for 6 months. Per cards, if she needs lung biopsy in the near future, plavix could potentially be stopped after 3 months use ie in 03/2019.   Atrial flutter (HCC) PleasecontinueEliquis as prescribed  Type 2 diabetes  mellitus with hyperglycemia,withlong-term current use of insulin (HCC) Monitor blood sugars as you have been doing Plan is to keep you off prednisone for now Follow up with PCP for blood sugar management Please let us know if you have any issues.  Chronic prednisone Use Plan Need to consider DEX scan and Calcium and Vitamin D therapy at next [place] Flu shot 05/13/2019

## 2019-05-25 NOTE — Progress Notes (Signed)
Virtual Visit via Telephone Note  I connected with [name] on 05/25/19 at  9:00 AM EDT by telephone and verified that I am speaking with the correct person using two identifiers.  Location: Patient: At home  Provider: At the office [id number]   I discussed the limitations, risks, security and privacy concerns of performing an evaluation and management service by telephone and the availability of in person appointments. I also discussed with the patient that there may be a patient responsible charge related to this service. The patient expressed understanding and agreed to proceed.   History of Present Illness: Pt. Call for follow up. She states her attempt to come off her prednisone completely was not successful. She had been off prednisone since 05/03/2019 ( She took herself off the medication  when she ran out of her prednisone because she wanted to see if she could do without the medication. ) She had a 6 minute walk on 9/30, and she states since that she had worsening shortness of breath  And cough on 05/14/2019 . She called the office 05/18/2019 asking to be placed back on her prednisone. She states she was unable to even shower at that point. We placed her on a prednisone taper and she has improved. She completed her taper 05/24/2019. She states she is much less short of breath and her cough has improved.    Observations/Objective:   Assessment and Plan: Lupus (HCC)/ ILD Cardiac involvement ? Pulmonary involvement vs ILDflareas cause of SOB and dry cough Plan Continue taking Lasix daily as prescribed. Continue Plaqueniland Imuranas prescribed Follow up with Dr. Pearline Cables 07/2019 Continue Zyrtec and Prilosec. Please contact office for sooner follow up if symptoms do not improve or worsen or seek emergency care    Coronary artery disease Proximal LAD lesion previously seen to be moderate on coronary angiography in August 2019, appears to have progressed to a high-grade 90%  lesion. Intervention to this lesion with placement of Synergy 3.5 x 16 mm drug-eluting stent with postdilatation with excellent results and no residual stenosis.  Patient did note improvement in shortness of breath symptoms after successful coronary revascularization.  Her resting tachycardia has also improved Plan: ContinueAspirin/plavix/Eliquis per cards Then stop Aspirin, continue eliquis and plavix for 6 months. Per cards, if she needs lung biopsy in the near future, plavix could potentially be stopped after 3 months use ie in 03/2019.   Atrial flutter (HCC) PleasecontinueEliquis as prescribed  Type 2 diabetes mellitus with hyperglycemia,withlong-term current use of insulin (HCC) Monitor blood sugars as you have been doing Plan is to keep you off prednisone for now Follow up with PCP for blood sugar management Please let us know if you have any issues.  Chronic prednisone Use Plan Need to consider DEX scan and Calcium and Vitamin D therapy at next [place] Flu shot given 05/13/2019     Follow Up Instructions: Follow up with Dr. Chase Caller 10/29 as scheduled>> will try to see if he can see you sooner.  HRCT has been ordered.   I discussed the assessment and treatment plan with the patient. The patient was provided an opportunity to ask questions and all were answered. The patient agreed with the plan and demonstrated an understanding of the instructions.   The patient was advised to call back or seek an in-person evaluation if the symptoms worsen or if the condition fails to improve as anticipated.  I provided 30 minutes of non-face-to-face time during this encounter.  Magdalen Spatz, NP 05/25/2019 9:40 AM

## 2019-05-27 ENCOUNTER — Ambulatory Visit: Payer: Medicaid Other

## 2019-05-28 ENCOUNTER — Ambulatory Visit (INDEPENDENT_AMBULATORY_CARE_PROVIDER_SITE_OTHER): Payer: 59 | Admitting: Emergency Medicine

## 2019-05-28 ENCOUNTER — Other Ambulatory Visit: Payer: Self-pay

## 2019-05-28 DIAGNOSIS — Z3042 Encounter for surveillance of injectable contraceptive: Secondary | ICD-10-CM | POA: Diagnosis not present

## 2019-05-28 MED ORDER — MEDROXYPROGESTERONE ACETATE 150 MG/ML IM SUSP
150.0000 mg | Freq: Once | INTRAMUSCULAR | Status: AC
  Administered 2019-05-28: 150 mg via INTRAMUSCULAR

## 2019-05-28 NOTE — Progress Notes (Signed)
[name] here for Depo-Provera  Injection.  Injection administered without complication. Patient will return in 3 months for next injection.  Loma Sousa, RN 05/28/19 1017

## 2019-06-05 ENCOUNTER — Encounter: Payer: Self-pay | Admitting: Nurse Practitioner

## 2019-06-05 ENCOUNTER — Ambulatory Visit (INDEPENDENT_AMBULATORY_CARE_PROVIDER_SITE_OTHER)
Admission: RE | Admit: 2019-06-05 | Discharge: 2019-06-05 | Disposition: A | Discharge status: Home / Self Care | Payer: 59 | Source: Ambulatory Visit | Attending: Acute Care | Admitting: Acute Care

## 2019-06-05 ENCOUNTER — Other Ambulatory Visit: Payer: Self-pay

## 2019-06-05 ENCOUNTER — Ambulatory Visit: Payer: 59 | Attending: Nurse Practitioner | Admitting: Nurse Practitioner

## 2019-06-05 ENCOUNTER — Other Ambulatory Visit: Payer: Self-pay | Admitting: Nurse Practitioner

## 2019-06-05 VITALS — BP 142/94 | HR 107 | Temp 99.4°F | Ht 60.0 in | Wt 233.0 lb

## 2019-06-05 DIAGNOSIS — I10 Essential (primary) hypertension: Secondary | ICD-10-CM | POA: Diagnosis not present

## 2019-06-05 DIAGNOSIS — K219 Gastro-esophageal reflux disease without esophagitis: Secondary | ICD-10-CM

## 2019-06-05 DIAGNOSIS — N921 Excessive and frequent menstruation with irregular cycle: Secondary | ICD-10-CM | POA: Diagnosis not present

## 2019-06-05 DIAGNOSIS — J849 Interstitial pulmonary disease, unspecified: Secondary | ICD-10-CM

## 2019-06-05 DIAGNOSIS — E1165 Type 2 diabetes mellitus with hyperglycemia: Secondary | ICD-10-CM | POA: Diagnosis not present

## 2019-06-05 DIAGNOSIS — Z1231 Encounter for screening mammogram for malignant neoplasm of breast: Secondary | ICD-10-CM

## 2019-06-05 LAB — POCT URINALYSIS DIP (CLINITEK)
Bilirubin, UA: NEGATIVE
Blood, UA: NEGATIVE
Glucose, UA: 500 mg/dL — AB
Ketones, POC UA: NEGATIVE mg/dL
Leukocytes, UA: NEGATIVE
Nitrite, UA: NEGATIVE
POC PROTEIN,UA: NEGATIVE
Spec Grav, UA: 1.01 (ref 1.010–1.025)
Urobilinogen, UA: 0.2 E.U./dL
pH, UA: 6 (ref 5.0–8.0)

## 2019-06-05 LAB — POCT GLYCOSYLATED HEMOGLOBIN (HGB A1C): Hemoglobin A1C: 13.2 % — AB (ref 4.0–5.6)

## 2019-06-05 LAB — GLUCOSE, POCT (MANUAL RESULT ENTRY)
POC Glucose: 546 mg/dl — AB (ref 70–99)
POC Glucose: 514 mg/dL — AB (ref 70–99)

## 2019-06-05 MED ORDER — TRUEPLUS PEN NEEDLES 32G X 4 MM MISC: 3 refills | Status: DC

## 2019-06-05 MED ORDER — VICTOZA 18 MG/3ML ~~LOC~~ SOPN: PEN_INJECTOR | SUBCUTANEOUS | 2 refills | Status: DC

## 2019-06-05 MED ORDER — ROSUVASTATIN CALCIUM 20 MG PO TABS: 20.0000 mg | ORAL_TABLET | Freq: Every day | ORAL | 4 refills | Status: DC

## 2019-06-05 MED ORDER — INSULIN ASPART 100 UNIT/ML ~~LOC~~ SOLN
20.0000 [IU] | Freq: Once | SUBCUTANEOUS | Status: AC
  Administered 2019-06-05: 20 [IU] via SUBCUTANEOUS

## 2019-06-05 MED ORDER — OMEPRAZOLE 20 MG PO CPDR: 20.0000 mg | DELAYED_RELEASE_CAPSULE | Freq: Every day | ORAL | 1 refills | Status: DC | PRN

## 2019-06-05 MED ORDER — BASAGLAR KWIKPEN 100 UNIT/ML ~~LOC~~ SOPN: 20.0000 [IU] | PEN_INJECTOR | Freq: Every day | SUBCUTANEOUS | 3 refills | Status: DC

## 2019-06-05 MED ORDER — NOVOLOG FLEXPEN 100 UNIT/ML ~~LOC~~ SOPN: 10.0000 [IU] | PEN_INJECTOR | Freq: Three times a day (TID) | SUBCUTANEOUS | 1 refills | Status: DC

## 2019-06-05 NOTE — Telephone Encounter (Signed)
Nexium is not covered by insurance. Will route to PCP for therapeutic switch if appropriate.

## 2019-06-05 NOTE — Patient Instructions (Signed)
Take  BASAGLAR At night  Victoza in the AM before breakfast  Novolog 10 units each big meal only.

## 2019-06-05 NOTE — Progress Notes (Signed)
Assessment & Plan:  [name] was seen today for follow-up.  Diagnoses and all orders for this visit:  Type 2 diabetes mellitus with hyperglycemia, without long-term current use of insulin (HCC) -     Glucose (CBG) -     HgB A1c -     POCT URINALYSIS DIP (CLINITEK) -     Insulin Glargine (BASAGLAR KWIKPEN) 100 UNIT/ML SOPN; Inject 0.2 mLs (20 Units total) into the skin at bedtime. -     insulin aspart (NOVOLOG FLEXPEN) 100 UNIT/ML FlexPen; Inject 10 Units into the skin 3 (three) times daily with meals. -     Insulin Pen Needle (TRUEPLUS PEN NEEDLES) 32G X 4 MM MISC; Use as directed to inject insulin 5x daily -     liraglutide (VICTOZA) 18 MG/3ML SOPN; SubQ:  Inject 0.6 mg once daily into the skin. Week 2: increase to 1.2 mg once daily; week 3: increase to 1.8 mg once daily -     Lipid panel -     Glucose (CBG) -     insulin aspart (novoLOG) injection 20 Units Continue blood sugar control as discussed in office today, low carbohydrate diet, and regular physical exercise as tolerated, 150 minutes per week (30 min each day, 5 days per week, or 50 min 3 days per week). Keep blood sugar logs with fasting goal of 90-130 mg/dl, post prandial (after you eat) less than 180.  For Hypoglycemia: BS <60 and Hyperglycemia BS >400; contact the clinic ASAP. Annual eye exams and foot exams are recommended.  Diabetes is poorly controlled. Advised patient to keep a fasting blood sugar log fast, 2 hours post lunch and bedtime which will be reviewed at the next office visit.   I attempted to call [name] at 6:30 pm to check on her blood glucose readings and was unable to leave a message and she did not pick up   Gastroesophageal reflux disease, unspecified whether esophagitis present -     omeprazole (PRILOSEC) 20 MG capsule; Take 1 capsule (20 mg total) by mouth daily as needed. Take only as needed.  -     CBC INSTRUCTIONS: Avoid GERD Triggers: acidic, spicy or fried foods, caffeine, coffee, sodas,   alcohol and chocolate.   Breast cancer screening by mammogram -     MM 3D SCREEN BREAST BILATERAL; Future  Menorrhagia with irregular cycle -     CBC  Essential Hypertension Continue all antihypertensives as prescribed.  Remember to bring in your blood pressure log with you for your follow up appointment.  DASH/Mediterranean Diets are healthier choices for HTN.    Patient has been counseled on age-appropriate routine health concerns for screening and prevention. These are reviewed and up-to-date. Referrals have been placed accordingly. Immunizations are up-to-date or declined.    Subjective:   Chief Complaint  Patient presents with  . Follow-up    Pt. is here for 3 months follow up.    HPI [name] 43 y.o. female presents to office today for follow up.  has a past medical history of Abnormal Pap smear of cervix, Allergy, Anemia, Atrial fibrillation ([place]), CHF (congestive heart failure) ([place]), Depression, Enlarged heart, Fibroid, Gestational diabetes, Heart murmur, Hypertension, Infection, and Lupus ([place]) (1999).    DM TYPE 2 She has been restarted on prednisone for her lupus/ILD by pulmonology. Blood glucose levels are poorly controlled. Required 20 units of novolog here in the office today. No ketonuria and she is asymptomatic. Will start novolog TID, victoza daily and  continue basaglar 81m nightly. Stopped Metformin due to previous renal issues. She states blood sugars at home consistently >300. Also dietary non adherent. I have instructed her to go to the Emergency room today due to elevated blood glucose reading here in office however she declines and states she will go home and take her medication and send me a mychart message regarding her blood glucose level this evening. I have instructed her if she has a reading >350 she will need to still go to the emergency room.  Lab Results  Component Value Date   HGBA1C 13.2 (A) 06/05/2019   Essential Hypertension Not well  controlled today. She is taking losartan 50 mg daily, cardizem 240 mg daily as prescribed. Denies chest pain, palpitations, lightheadedness, dizziness, headaches.  BP Readings from Last 3 Encounters:  06/05/19 (!) 142/94  05/13/19 128/74  04/13/19 117/89   Review of Systems  Constitutional: Negative for fever, malaise/fatigue and weight loss.  HENT: Negative.  Negative for nosebleeds.   Eyes: Negative.  Negative for blurred vision, double vision and photophobia.  Respiratory: Positive for cough (chronic) and shortness of breath.   Cardiovascular: Negative.  Negative for chest pain, palpitations and leg swelling.  Gastrointestinal: Negative.  Negative for heartburn, nausea and vomiting.  Musculoskeletal: Negative.  Negative for myalgias.  Neurological: Negative.  Negative for dizziness, focal weakness, seizures and headaches.  Psychiatric/Behavioral: Negative.  Negative for suicidal ideas.    Past Medical History:  Diagnosis Date  . Abnormal Pap smear of cervix    colpo, HPV  . Allergy   . Anemia   . [place]   . [place]   . Depression    after losses  . Enlarged heart    managed by cardiology  . Fibroid   . Gestational diabetes   . Heart murmur   . Hypertension   . Infection    UTI  . [place]    Past Surgical History:  Procedure Laterality Date  . A-FLUTTER ABLATION N/A 06/10/2018   Procedure: A-FLUTTER ABLATION;  Surgeon: TEvans Lance MD;  [facility];  Service: Cardiovascular;  Laterality: N/A;  . CARDIAC CATHETERIZATION    . CORONARY STENT INTERVENTION N/A 12/12/2018   Procedure: CORONARY STENT INTERVENTION;  Surgeon: PNigel Mormon MD;  [facility];  Service: Cardiovascular;  Laterality: N/A;  lad   . RIGHT/LEFT HEART CATH AND CORONARY ANGIOGRAPHY N/A 03/14/2018   Procedure: RIGHT/LEFT HEART CATH AND CORONARY ANGIOGRAPHY;  Surgeon: PNigel Mormon MD;  [facility];  Service: Cardiovascular;  Laterality: N/A;  . RIGHT/LEFT HEART CATH AND CORONARY ANGIOGRAPHY N/A 12/12/2018   Procedure: RIGHT/LEFT HEART CATH AND CORONARY ANGIOGRAPHY;  Surgeon: PNigel Mormon MD;  [facility];  Service: Cardiovascular;  Laterality: N/A;  . THERAPEUTIC ABORTION      Family History  Problem Relation Age of Onset  . Cancer Maternal Grandfather        lung  . Diabetes Mother   . Hypertension Mother   . Hypertension Maternal Grandmother   . Diabetes Father   . Hearing loss Neg Hx     Social History Reviewed with no changes to be made today.   Outpatient Medications Prior to Visit  Medication Sig Dispense Refill  . Accu-Chek FastClix Lancets MISC Use as directed three times daily 102 each 12  . acetaminophen (TYLENOL) 500 MG tablet Take 1,000 mg by mouth every 6 (six)  hours as needed for headache (pain).    Marland Kitchen albuterol (PROVENTIL HFA;VENTOLIN HFA) 108 (90 Base) MCG/ACT inhaler Inhale 2 puffs into the lungs every 6 (six) hours as needed for wheezing or shortness of breath. 1 Inhaler 2  . apixaban (ELIQUIS) 5 MG TABS tablet Take 1 tablet (5 mg total) by mouth 2 (two) times daily. 60 tablet 2  . azaTHIOprine (IMURAN) 50 MG tablet Take 50 mg by mouth 2 (two) times daily.    . Blood Glucose Monitoring Suppl (ACCU-CHEK GUIDE) w/Device KIT 1 each by Does not apply route 3 (three) times daily. 1 kit 0  . clopidogrel (PLAVIX) 75 MG tablet Take 1 tablet (75 mg total) by mouth daily with breakfast. 60 tablet 2  . colchicine 0.6 MG tablet Take 1 tablet (0.6 mg total) by mouth daily. 60 tablet 1  . cyclobenzaprine (FLEXERIL) 10 MG tablet Take 1 tablet (10 mg total) by mouth 2 (two) times daily as needed. 10 tablet 0  . diltiazem (CARDIZEM CD) 240 MG 24 hr capsule Take 1 capsule (240 mg total) by mouth daily. 90 capsule 2  . furosemide (LASIX) 40 MG tablet TAKE 1 TABLET (40 MG TOTAL) BY MOUTH 2 (TWO) TIMES DAILY. 60 tablet 2  . glucose blood  (ACCU-CHEK GUIDE) test strip Use as directed three times daily 100 each 12  . hydroxychloroquine (PLAQUENIL) 200 MG tablet Take 1 tablet (200 mg total) by mouth 2 (two) times daily. 60 tablet 4  . lidocaine (LIDODERM) 5 % Place 1 patch onto the skin daily. Remove & Discard patch within 12 hours or as directed by MD 30 patch 0  . losartan (COZAAR) 50 MG tablet Take 1 tablet (50 mg total) by mouth daily. 90 tablet 3  . medroxyPROGESTERone (DEPO-PROVERA) 150 MG/ML injection Inject 150 mg into the muscle every 3 (three) months.    . Phenyleph-CPM-DM-Aspirin (ALKA-SELTZER PLUS COLD & COUGH PO) Take 2 tablets by mouth every 4 (four) hours as needed (cough/congestion).    . potassium chloride SA (K-DUR) 20 MEQ tablet Take 2 tablets (40 mEq total) by mouth daily. 90 tablet 2  . predniSONE (DELTASONE) 5 MG tablet Take 1.5 tablets (7.5 mg total) by mouth daily with breakfast. 90 tablet 0  . predniSONE (DELTASONE) 5 MG tablet Take 1 tablet (5 mg total) by mouth daily with breakfast. Take a total of 7.5 mg daily with breakfast until 10/29. (Patient taking differently: Take 7.5 mg by mouth daily with breakfast. Take a total of 7.5 mg daily with breakfast until 10/29.) 30 tablet 0  . traMADol (ULTRAM) 50 MG tablet Take 1 tablet by mouth as needed.    . Insulin Glargine (BASAGLAR KWIKPEN) 100 UNIT/ML SOPN Inject 0.2 mLs (20 Units total) into the skin daily. Must have office visit for refills 6 mL 0  . Insulin Pen Needle (TRUEPLUS PEN NEEDLES) 32G X 4 MM MISC Use as directed to inject insulin once daily 100 each 3  . rosuvastatin (CRESTOR) 20 MG tablet Take 1 tablet (20 mg total) by mouth daily at 6 PM. 30 tablet 4  . benzonatate (TESSALON) 200 MG capsule Take 1 capsule (200 mg total) by mouth 3 (three) times daily as needed for cough. (Patient not taking: Reported on 06/05/2019) 30 capsule 0  . cetirizine (ZYRTEC) 10 MG tablet Take 1 tablet (10 mg total) by mouth daily. 90 tablet 1  . methocarbamol (ROBAXIN) 500 MG  tablet Take 2 tablets (1,000 mg total) by mouth every 8 (eight) hours as needed  for muscle spasms. (Patient not taking: Reported on 05/25/2019) 90 tablet 0  . predniSONE (DELTASONE) 10 MG tablet 4 tabs x 2 days, 3 tabs x 2 days, 2 tabs x 2 days 1 tab x 2 days then stop. (Patient not taking: Reported on 05/25/2019) 20 tablet 0  . metFORMIN (GLUCOPHAGE) 1000 MG tablet Take 1 tablet (1,000 mg total) by mouth 2 (two) times daily with a meal. (Patient not taking: Reported on 06/05/2019) 60 tablet 3  . metroNIDAZOLE (FLAGYL) 500 MG tablet Take 1 tablet (500 mg total) by mouth 2 (two) times daily. (Patient not taking: Reported on 05/25/2019) 14 tablet 0  . omeprazole (PRILOSEC) 20 MG capsule Take 1 capsule (20 mg total) by mouth daily as needed (for heartburn or GERD-like symptoms). (Patient taking differently: Take 20 mg by mouth daily. ) 30 capsule 3   No facility-administered medications prior to visit.     Allergies  Allergen Reactions  . Celery Oil Shortness Of Breath, Itching, Rash and Other (See Comments)    Bumps on tongue, also  . Peanut-Containing Drug Products Anaphylaxis, Shortness Of Breath, Itching, Rash and Other (See Comments)    Bumps on tongue, also  . Septra [Bactrim] Hives       Objective:    BP (!) 142/94 (BP Location: Left Arm, Patient Position: Sitting, Cuff Size: Large)   Pulse (!) 107   Temp 99.4 F (37.4 C) (Oral)   Ht 5' (1.524 m)   Wt 233 lb (105.7 kg)   SpO2 95%   BMI 45.50 kg/m  Wt Readings from Last 3 Encounters:  06/05/19 233 lb (105.7 kg)  05/13/19 239 lb 9.6 oz (108.7 kg)  04/13/19 230 lb (104.3 kg)    Physical Exam Vitals signs and nursing note reviewed.  Constitutional:      Appearance: She is well-developed.  HENT:     Head: Normocephalic and atraumatic.  Neck:     Musculoskeletal: Normal range of motion.  Cardiovascular:     Rate and Rhythm: Regular rhythm. Tachycardia present.     Heart sounds: Normal heart sounds. No murmur. No friction  rub. No gallop.   Pulmonary:     Effort: Pulmonary effort is normal. Tachypnea present. No bradypnea, accessory muscle usage, prolonged expiration or respiratory distress.     Breath sounds: Normal breath sounds. No decreased breath sounds, wheezing, rhonchi or rales.  Chest:     Chest wall: No tenderness.  Abdominal:     General: Bowel sounds are normal.     Palpations: Abdomen is soft.  Musculoskeletal: Normal range of motion.  Skin:    General: Skin is warm and dry.  Neurological:     Mental Status: She is alert and oriented to person, place, and time.     Coordination: Coordination normal.  Psychiatric:        Behavior: Behavior normal. Behavior is cooperative.        Thought Content: Thought content normal.        Judgment: Judgment normal.          Patient has been counseled extensively about nutrition and exercise as well as the importance of adherence with medications and regular follow-up. The patient was given clear instructions to go to ER or return to medical center if symptoms don't improve, worsen or new problems develop. The patient verbalized understanding.   Follow-up: Return in about 4 weeks (around 07/03/2019) for [facility] then me 3 months.   Gildardo Pounds, FNP-BC [facility]  [facility] and [place], [place]   06/05/2019, 6:24 PM

## 2019-06-06 ENCOUNTER — Other Ambulatory Visit: Payer: Self-pay | Admitting: Nurse Practitioner

## 2019-06-06 LAB — CBC
Hematocrit: 39.2 % (ref 34.0–46.6)
Hemoglobin: 12 g/dL (ref 11.1–15.9)
MCH: 25 pg — ABNORMAL LOW (ref 26.6–33.0)
MCHC: 30.6 g/dL — ABNORMAL LOW (ref 31.5–35.7)
MCV: 82 fL (ref 79–97)
Platelets: 297 10*3/uL (ref 150–450)
RBC: 4.8 x10E6/uL (ref 3.77–5.28)
RDW: 14.1 % (ref 11.7–15.4)
WBC: 5.9 10*3/uL (ref 3.4–10.8)

## 2019-06-06 LAB — LIPID PANEL
Chol/HDL Ratio: 3.3 ratio (ref 0.0–4.4)
Cholesterol, Total: 154 mg/dL (ref 100–199)
HDL: 46 mg/dL (ref >39)
LDL Chol Calc (NIH): 80 mg/dL (ref 0–99)
Triglycerides: 166 mg/dL — ABNORMAL HIGH (ref 0–149)
VLDL Cholesterol Cal: 28 mg/dL (ref 5–40)

## 2019-06-06 MED ORDER — ESOMEPRAZOLE MAGNESIUM 20 MG PO CPDR: 20.0000 mg | DELAYED_RELEASE_CAPSULE | Freq: Every day | ORAL | 1 refills | Status: DC | PRN

## 2019-06-07 ENCOUNTER — Other Ambulatory Visit: Payer: Self-pay | Admitting: Nurse Practitioner

## 2019-06-08 NOTE — Telephone Encounter (Signed)
Pharmacy called stated that patient's plan will not cover Nexium. They are requesting pantoprazole - will send to PCP for approval.

## 2019-06-11 ENCOUNTER — Other Ambulatory Visit: Payer: Self-pay

## 2019-06-11 ENCOUNTER — Ambulatory Visit (INDEPENDENT_AMBULATORY_CARE_PROVIDER_SITE_OTHER): Payer: 59 | Admitting: Internal Medicine

## 2019-06-11 ENCOUNTER — Telehealth: Payer: Self-pay | Admitting: Acute Care

## 2019-06-11 ENCOUNTER — Encounter: Payer: Self-pay | Admitting: Internal Medicine

## 2019-06-11 VITALS — BP 118/72 | HR 80 | Temp 98.0°F | Ht 60.0 in | Wt 233.2 lb

## 2019-06-11 DIAGNOSIS — M359 Systemic involvement of connective tissue, unspecified: Secondary | ICD-10-CM

## 2019-06-11 DIAGNOSIS — M329 Systemic lupus erythematosus, unspecified: Secondary | ICD-10-CM

## 2019-06-11 DIAGNOSIS — J8489 Other specified interstitial pulmonary diseases: Secondary | ICD-10-CM | POA: Diagnosis not present

## 2019-06-11 DIAGNOSIS — I3139 Other pericardial effusion (noninflammatory): Secondary | ICD-10-CM

## 2019-06-11 DIAGNOSIS — R0683 Snoring: Secondary | ICD-10-CM | POA: Diagnosis not present

## 2019-06-11 DIAGNOSIS — I313 Pericardial effusion (noninflammatory): Secondary | ICD-10-CM

## 2019-06-11 DIAGNOSIS — Z6841 Body Mass Index (BMI) 40.0 and over, adult: Secondary | ICD-10-CM

## 2019-06-11 DIAGNOSIS — I272 Pulmonary hypertension, unspecified: Secondary | ICD-10-CM

## 2019-06-11 NOTE — Progress Notes (Addendum)
OV 06/11/2019  Subjective:  Patient ID: [name], female , DOB: 12/12/1975 , age 43 y.o. , MRN: [id number] , ADDRESS: [address] [place] [address]   06/11/2019 -   Chief Complaint  Patient presents with  . Follow-up    Patient reports that she's doing much better and her cough is alot better than it was.      HPI [name] 43 y.o. -is being evaluated to the interstitial lung disease clinic.  She is lupus related interstitial lung disease.  Details are all listed below.  She is unaware of the presence of interstitial lung disease.  I personally visualized the scan.  Documented to have ILD in 2011 worse in February 2020 the traction bronchiectasis is worse now in October 2020 later scan.  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  Severity is listed below.  Is associated with arthralgia.  Also since May 2019 she has chronic cough that is very severe.  It gets better with prednisone.  Sometimes clear sputum but sometimes dry.  It is worse when she lies down.  It affects her voice she clears her throat and she does feel a tickle in the back of the throat.  Other symptoms include arthralgia muscle pain rash from lupus and depression. Dyspnea is progressive this year.   Past Medical History :  -   #Lupus -according to her lupus was diagnosed in 1999.  This was at the time of childbirth.  Since then through May 2019 symptoms were mainly malar rash and other skin lesions and arthralgia.  She was on chronic long-term Plaquenil.  She would only be on occasional prednisone.   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  This includes pericardial effusion,  coronary artery disease,  atrial fibrillation.  She says since then she is required prednisone which she took constantly for 3 months through August 2019.  After that she has had intermittent course of prednisone ranging from several days to few months.  In total 7 times.  She has had admissions to the hospital because of this and is listing for heart failure, coronary artery disease and also ILD flareup.  She had a right heart cath and left heart cath.  This was done in May 2020 by Dr. Virgina Jock.  She is status post coronary stent and is on antiplatelet therapy with Plavix and Eliquis.  Her wedge pressures were slightly high and so was a pulmonary artery pressures.  Her longstanding pericardial effusion is being monitored clinically.  There is no evidence of tamponade as of May 2020 right heart catheterization.  She states that she is been on and off prednisone.  In spring 2020 she was started on Imuran by Dr. Melissa Noon team.  Most recently in September 2020 [late September] she tried to wean herself off prednisone but then her shortness of breath cough wheezing and skin lesions and arthralgia all get worse and so she is back on prednisone.  This most recent prednisone was initiated by our office nurse practitioner.  She feels Imuran is not  controlling her lupus as well as the prednisone    #History positive for CHF not otherwise specified coronary artery disease and pericardial effusion and atrial fibrillation  #  she says she has sleep apnea but is not on CPAP.  Details not known.  She has diabetes.  She has DJD  ROS: Positive for fatigue arthralgia discoloration of her fingers in cold weather [Raynaud's] malar rash and other rash snoring heartburn but no oral ulcers.  No nausea vomiting or diarrhea.   FAMILY HISTORY of LUNG DISEASE: She has a cousin with asthma and grandmother with sarcoidosis.  Another  cousin with an autoimmune disease.   EXPOSURE HISTORY: She does not smoke cigarettes.  No electronic cigarette use.  No marijuana use no cocaine use no IV drug abuse.   HOME and HOBBY DETAILS : [place] the last 2 years.  Age of the home is unknown.  In the home there is no history of any organic antigen exposure.  For example she does not live in a damp environment.  There is no mildew in the bathrooms.  There is no humidifier.  She does not use a CPAP.  No nebulizer machine use.  No steam Jacuzzi.  No steam iron.  There is no fountain in the house.  No pet birds no hamsters of pet gerbils.  Does not use feather pillow or duvet.  There is no mold in the [facility] duct.  Does not play musical instruments.  Does not do any gardening.   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  She is also been on colchicine she says.  She is been on prednisone on and off since 2019.  Also Dilantin according to history.      SYMPTOM SCALE - ILD 06/11/2019   O2 use RA  Shortness of Breath 0 -> 5 scale with 5 being worst (score 6 If unable to do)  At rest 0  Simple tasks - showers, clothes change, eating, shaving 2  Household (dishes, doing bed, laundry) 2  Shopping 2  Walking level at own pace 2  Walking keeping up with others of same age 55  Walking up Stairs 2  Walking up [place] 2  Total (40 - 48) Dyspnea Score 14  How bad is your cough? 1  How bad is your fatigue 2   Simple office walk 185 feet x  3 laps goal with forehead probe 06/11/2019   O2 used RA  Number laps completed 3  Comments about pace Nl pace  Resting Pulse Ox/HR 98% and 104/min  Final Pulse Ox/HR 96% and 127/min  Desaturated </= 88% no  Desaturated <= 3% points no  Got Tachycardic >/= 90/min yes  Symptoms at end of test Very mild  dyspnea  Miscellaneous comments *feeling better      Results for [name], [name] (MRN [id number]) as of 06/11/2019 09:17  Ref. Range 05/12/2019 08:52  FVC-Pre Latest Units: Pittman 1.65  FVC-%Pred-Pre Latest Units: % 63  FEV1-Pre Latest Units: Pittman 1.48  FEV1-%Pred-Pre Latest Units: % 69  Pre FEV1/FVC ratio Latest Units: % 90   Results for [name], [name] (MRN [id number]) as of 06/11/2019 09:17  Ref. Range 05/12/2019 08:52  TLC Latest Units: Pittman 2.72  TLC % pred Latest Units: % 61   Results for Reckart, [name] (  MRN [id number]) as of 06/11/2019 09:17  Ref. Range 05/12/2019 08:52  DLCO unc Latest Units: ml/min/mmHg 10.98  DLCO unc % pred Latest Units: % 58    IMPRESSION: HRCT 1. Spectrum of findings compatible with basilar predominant fibrotic interstitial lung disease without frank honeycombing. Slight worsening of traction bronchiectasis since 09/24/2018 high-resolution chest CT. 2. Stable mild cardiomegaly and moderate pericardial effusion. Findings are categorized as probable UIP per consensus guidelines: Diagnosis of Idiopathic Pulmonary Fibrosis: An Official ATS/ERS/JRS/ALAT Clinical Practice Guideline. [place], Iss 5, [phone], Apr 13 2017. 3. Three-vessel coronary atherosclerosis. 4. Stable mild mediastinal lymphadenopathy, compatible with benign reactive etiology.  Aortic Atherosclerosis (ICD10-I70.0).   Electronically Signed   By: Ilona Sorrel M.D.   On: 06/05/2019 16:47  ROS - per HPI    HEART CATH MAy 6433  LV end diastolic pressure is normal.   RA: 10 mmHg RV: 46/7 mmHg. RVEDP 8 mmHg PA: 48/22 mmHg. Mean PA 33 mmHg PW: 17 mmHg LV 155/7 mmHg. LVEDP 10 mmHg <10 mmHg LV-Ao pullback gradient CO 7.8 Pittman/min. CI 4 Pittman/min/m2  Simultaneous RV-LV pressure tracings do not show interdependence to suggest constriction physiology.  LM: Wall calcium with no significant stenosis LAD: Ostial-proximal 90% stenosis. Normal diagonal  branches. Successful PTCA and stent placement pLAD Synergy DES 3.5X16 mm DES Post dilatation with 4.0 X 8 mm [place] balloon. 0% residual stenosis Ramus: Normal LCx: Normal RCA: Normal  Recommendation: Aspirin/plavix/Eliquis for 1 month. Then stop Aspirin, continue eliquis and plavix for 6 months.  Patients medical complicities remain complex. She still needs aggressive management for her lupus and interstitial lung disease. Should she need lung biopsy in the near future, plavix could potentially be stopped after 3 months use ie in 03/2019.  [name], MD [facility]. [place] Pager: [phone] Office: [phone] If no answer Cell [phone]   has a past medical history of Abnormal Pap smear of cervix, Allergy, Anemia, Atrial fibrillation ([place]), CHF (congestive heart failure) ([place]), Depression, Enlarged heart, Fibroid, Gestational diabetes, Heart murmur, Hypertension, Infection, and Lupus ([place]) (1999).   reports that she has never smoked. She has never used smokeless tobacco.  Past Surgical History:  Procedure Laterality Date  . A-FLUTTER ABLATION N/A 06/10/2018   Procedure: A-FLUTTER ABLATION;  Surgeon: Evans Lance, MD;  Location: [place] [facility];  Service: Cardiovascular;  Laterality: N/A;  . CARDIAC CATHETERIZATION    . CORONARY STENT INTERVENTION N/A 12/12/2018   Procedure: CORONARY STENT INTERVENTION;  Surgeon: [name], MD;  Location: [place] [facility];  Service: Cardiovascular;  Laterality: N/A;  lad   . RIGHT/LEFT HEART CATH AND CORONARY ANGIOGRAPHY N/A 03/14/2018   Procedure: RIGHT/LEFT HEART CATH AND CORONARY ANGIOGRAPHY;  Surgeon: [name], MD;  Location: [place] [facility];  Service: Cardiovascular;  Laterality: N/A;  . RIGHT/LEFT HEART CATH AND CORONARY ANGIOGRAPHY N/A 12/12/2018   Procedure: RIGHT/LEFT HEART CATH AND CORONARY ANGIOGRAPHY;  Surgeon: [name], MD;  Location: [place] [facility];  Service:  Cardiovascular;  Laterality: N/A;  . THERAPEUTIC ABORTION      Allergies  Allergen Reactions  . Celery Oil Shortness Of Breath, Itching, Rash and Other (See Comments)    Bumps on tongue, also  . Peanut-Containing Drug Products Anaphylaxis, Shortness Of Breath, Itching, Rash and Other (See Comments)    Bumps on tongue, also  . Septra [Bactrim] Hives    Immunization History  Administered Date(s) Administered  . Influenza,inj,Quad PF,6+ Mos 05/25/2013, 05/14/2018, 05/13/2019  .  Pneumococcal Polysaccharide-23 05/14/2018  . Tdap 07/09/2018    Family History  Problem Relation Age of Onset  . Cancer Maternal Grandfather        lung  . Diabetes Mother   . Hypertension Mother   . Hypertension Maternal Grandmother   . Diabetes Father   . Hearing loss Neg Hx      Current Outpatient Medications:  .  Accu-Chek FastClix Lancets MISC, Use as directed three times daily, Disp: 102 each, Rfl: 12 .  acetaminophen (TYLENOL) 500 MG tablet, Take 1,000 mg by mouth every 6 (six) hours as needed for headache (pain)., Disp: , Rfl:  .  albuterol (PROVENTIL HFA;VENTOLIN HFA) 108 (90 Base) MCG/ACT inhaler, Inhale 2 puffs into the lungs every 6 (six) hours as needed for wheezing or shortness of breath., Disp: 1 Inhaler, Rfl: 2 .  apixaban (ELIQUIS) 5 MG TABS tablet, Take 1 tablet (5 mg total) by mouth 2 (two) times daily., Disp: 60 tablet, Rfl: 2 .  azaTHIOprine (IMURAN) 50 MG tablet, Take 50 mg by mouth 2 (two) times daily., Disp: , Rfl:  .  benzonatate (TESSALON) 200 MG capsule, Take 1 capsule (200 mg total) by mouth 3 (three) times daily as needed for cough., Disp: 30 capsule, Rfl: 0 .  Blood Glucose Monitoring Suppl (ACCU-CHEK GUIDE) w/Device KIT, 1 each by Does not apply route 3 (three) times daily., Disp: 1 kit, Rfl: 0 .  clopidogrel (PLAVIX) 75 MG tablet, Take 1 tablet (75 mg total) by mouth daily with breakfast., Disp: 60 tablet, Rfl: 2 .  colchicine 0.6 MG tablet, Take 1 tablet (0.6 mg total) by  mouth daily., Disp: 60 tablet, Rfl: 1 .  cyclobenzaprine (FLEXERIL) 10 MG tablet, Take 1 tablet (10 mg total) by mouth 2 (two) times daily as needed., Disp: 10 tablet, Rfl: 0 .  diltiazem (CARDIZEM CD) 240 MG 24 hr capsule, Take 1 capsule (240 mg total) by mouth daily., Disp: 90 capsule, Rfl: 2 .  furosemide (LASIX) 40 MG tablet, TAKE 1 TABLET (40 MG TOTAL) BY MOUTH 2 (TWO) TIMES DAILY., Disp: 60 tablet, Rfl: 2 .  glucose blood (ACCU-CHEK GUIDE) test strip, Use as directed three times daily, Disp: 100 each, Rfl: 12 .  hydroxychloroquine (PLAQUENIL) 200 MG tablet, Take 1 tablet (200 mg total) by mouth 2 (two) times daily., Disp: 60 tablet, Rfl: 4 .  insulin aspart (NOVOLOG FLEXPEN) 100 UNIT/ML FlexPen, Inject 10 Units into the skin 3 (three) times daily with meals., Disp: 30 mL, Rfl: 1 .  Insulin Glargine (BASAGLAR KWIKPEN) 100 UNIT/ML SOPN, Inject 0.2 mLs (20 Units total) into the skin at bedtime., Disp: 40 mL, Rfl: 3 .  Insulin Pen Needle (TRUEPLUS PEN NEEDLES) 32G X 4 MM MISC, Use as directed to inject insulin 5x daily, Disp: 200 each, Rfl: 3 .  lidocaine (LIDODERM) 5 %, Place 1 patch onto the skin daily. Remove & Discard patch within 12 hours or as directed by MD, Disp: 30 patch, Rfl: 0 .  liraglutide (VICTOZA) 18 MG/3ML SOPN, SubQ:  Inject 0.6 mg once daily into the skin. Week 2: increase to 1.2 mg once daily; week 3: increase to 1.8 mg once daily, Disp: 9 mL, Rfl: 2 .  losartan (COZAAR) 50 MG tablet, Take 1 tablet (50 mg total) by mouth daily., Disp: 90 tablet, Rfl: 3 .  medroxyPROGESTERone (DEPO-PROVERA) 150 MG/ML injection, Inject 150 mg into the muscle every 3 (three) months., Disp: , Rfl:  .  methocarbamol (ROBAXIN) 500 MG tablet, Take 2 tablets (  1,000 mg total) by mouth every 8 (eight) hours as needed for muscle spasms., Disp: 90 tablet, Rfl: 0 .  pantoprazole (PROTONIX) 40 MG tablet, Take 1 tablet (40 mg total) by mouth daily., Disp: 90 tablet, Rfl: 1 .  Phenyleph-CPM-DM-Aspirin  (ALKA-SELTZER PLUS COLD & COUGH PO), Take 2 tablets by mouth every 4 (four) hours as needed (cough/congestion)., Disp: , Rfl:  .  potassium chloride SA (K-DUR) 20 MEQ tablet, Take 2 tablets (40 mEq total) by mouth daily., Disp: 90 tablet, Rfl: 2 .  predniSONE (DELTASONE) 5 MG tablet, Take 1.5 tablets (7.5 mg total) by mouth daily with breakfast., Disp: 90 tablet, Rfl: 0 .  rosuvastatin (CRESTOR) 20 MG tablet, Take 1 tablet (20 mg total) by mouth daily at 6 PM., Disp: 30 tablet, Rfl: 4 .  traMADol (ULTRAM) 50 MG tablet, Take 1 tablet by mouth as needed., Disp: , Rfl:  .  cetirizine (ZYRTEC) 10 MG tablet, Take 1 tablet (10 mg total) by mouth daily., Disp: 90 tablet, Rfl: 1      Objective:   Vitals:   06/11/19 0904  BP: 118/72  Pulse: 80  Temp: 98 F (36.7 C)  TempSrc: Temporal  SpO2: 99%  Weight: 233 lb 3.2 oz (105.8 kg)  Height: 5' (1.524 m)    Estimated body mass index is 45.54 kg/m as calculated from the following:   Height as of this encounter: 5' (1.524 m).   Weight as of this encounter: 233 lb 3.2 oz (105.8 kg).  @WEIGHTCHANGE @  [employer]   06/11/19 0904  Weight: 233 lb 3.2 oz (105.8 kg)     Physical Exam  General Appearance:    Alert, cooperative, no distress, appears stated age - older , Deconditioned looking - yes , OBESE  - yes, Sitting on Wheelchair -  no  Head:    Normocephalic, without obvious abnormality, atraumatic  Eyes:    PERRL, conjunctiva/corneas clear,  Ears:    Normal TM's and external ear canals, both ears  Nose:   Nares normal, septum midline, mucosa normal, no drainage    or sinus tenderness. OXYGEN ON  - no . Patient is @ ra   Throat:   Lips, mucosa, and tongue normal; teeth and gums normal. Cyanosis on lips - no  Neck:   Supple, symmetrical, trachea midline, no adenopathy;    thyroid:  no enlargement/tenderness/nodules; no carotid   bruit or JVD  Back:     Symmetric, no curvature, ROM normal, no CVA tenderness  Lungs:     Distress - no ,  Wheeze no, Barrell Chest - no, Purse lip breathing - no, Crackles - yes faint at base   Chest Wall:    No tenderness or deformity.    Heart:    Regular rate and rhythm, S1 and S2 normal, no rub   or gallop, Murmur - no  Breast Exam:    NOT DONE  Abdomen:     Soft, non-tender, bowel sounds active all four quadrants,    no masses, no organomegaly. Visceral obesity - yes  Genitalia:   NOT DONE  Rectal:   NOT DONE  Extremities:   Extremities - normal, Has Cane - no, Clubbing - YES, Edema - chronic yes  Pulses:   2+ and symmetric all extremities  Skin:   Stigmata of Connective Tissue Disease - STIGMATA of CONNECTIVE TISSUE DISEASE  - Distal digital fissuring (ie, "mechanic hands") - no - Distal digital tip ulceration - no -Inflammatory arthritis or polyarticular morning joint  stiffness ?60 minutes - YES - Palmar telangiectasia - no - Raynaud phenomenon - YES per Hx - Unexplained digital edema - no - Unexplained fixed rash on the digital extensor surfaces (Gottron's sign) - no ... - Deformities of RA - no - Scleroderma  - no - Malar Rash -  YES   Lymph nodes:   Cervical, supraclavicular, and axillary nodes normal  Psychiatric:  Neurologic:   Pleasant - yes, Anxious - no, Flat affect - no  CAm-ICU - neg, Alert and Oriented x 3 - yes, Moves all 4s - yes, Speech - normal, Cognition - intact           Assessment:       ICD-10-CM   1. Interstitial lung disease due to connective tissue disease ([place])  J84.89    M35.9   2. Snoring  R06.83   3. Morbid obesity with BMI of 45.0-49.9, adult ([place])  E66.01    Z68.42   4. Lupus ([place])  M32.9   5. Pericardial effusion  I31.3   6. 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 -This was present in 2011 and worse in the scan in February 2020 and subsequently again worsen the  scan in October 2020  Plan  -I think he would benefit from class of medications called antifibrotic  -We discussed the options of nintedanib which is a theoretical bleeding risk versus pirfenidone which has a 10% incidence of skin side effects.  Both have GI side effects and require liver function test monitoring  -Based on our discussion you chose to take pirfenidone excepting the skin side effects and agreeing to wear sunscreen.  You do not want to do nintedanib because of the fact you are on Plavix and Eliquis   -Fill out the paperwork for this  - refer to [facility] for ILD-PRO registry study; consent form given  Snoring Morbid obesity with BMI of 45.0-49.9, adult [facility])  -Refer Dr. Elsworth Soho or Dr. Leretha Pol in our office for undiagnosed sleep apnea -Ultimately will have to figure out a weight loss plan  Lupus ([place])  -I think her lupus is very active and would recommend Dr. Amil Amen to handle therapy with prednisone and Imuran and consider alternatives.  I will call his office  Pericardial effusion  -Is described as moderate on the CT scan in October 2020.  I will inform Dr. Virgina Jock  Pulmonary hypertension -seen on right heart catheterization May 2020 but associated with high wedge pressure  --This was seen in the heart catheterization in May 2020 and was at that time because of excess fluid in the body -At some point will recommend Dr. Virgina Jock to repeat this procedure to see if lupus is causing this problem directly as opposed to fluid   Follow-up  -4-6 weeks ILD clinic 30-minute slot -       > 50% of this > 40 min visit spent in face to face counseling or/and coordination of care - by this undersigned MD - Dr Brand Males. This includes one or more of the following documented above: discussion of test results, diagnostic or treatment recommendations, prognosis, risks and benefits of management options, instructions, education, compliance or risk-factor  reduction   SIGNATURE    Dr. Brand Males, M.D., F.C.C.P,  Pulmonary and Critical Care Medicine Staff Physician, [facility]  Director - Interstitial Lung Disease  Program  Pulmonary [place] at [place], [place], [address]  Pager: [phone], If no answer or between  15:00h - 7:00h: call [phone] Telephone: [phone]  9:58 AM 06/11/2019

## 2019-06-11 NOTE — Telephone Encounter (Signed)
This patient has a follow up with you 10/29. This is the most recent HRCT.Please review with her at her appointment Thanks  FINDINGS: Cardiovascular: Mild cardiomegaly. Moderate pericardial effusion, stable. Three-vessel coronary atherosclerosis. Atherosclerotic nonaneurysmal thoracic aorta. Stable top-normal caliber main pulmonary artery (3.3 cm diameter).  Mediastinum/Nodes: No discrete thyroid nodules. Unremarkable esophagus. No pathologically enlarged axillary nodes. Mildly enlarged 1.0 cm right paratracheal node (series 2/image 49), stable using similar measurement technique. No new pathologically enlarged mediastinal nodes. No discrete hilar adenopathy on this non contrast scan.  HRCT: 06/05/2019 Lungs/Pleura: No pneumothorax. No pleural effusion. No acute consolidative airspace disease, lung masses or significant pulmonary nodules. There is extensive patchy confluent peripheral reticulation and ground-glass opacity throughout both lungs with associated moderate traction bronchiectasis, volume loss and architectural distortion. There is a strong basilar predominance to these findings. No frank honeycombing. Findings persist on prone sequence. No significant air trapping or evidence of tracheobronchomalacia on the expiration sequence. The traction bronchiectasis appears slightly progressed. The findings otherwise appear stable since 09/24/2018.  Upper abdomen: No acute abnormality.  Musculoskeletal: No aggressive appearing focal osseous lesions. Mild thoracic spondylosis.  IMPRESSION: 1. Spectrum of findings compatible with basilar predominant fibrotic interstitial lung disease without frank honeycombing. Slight worsening of traction bronchiectasis since 09/24/2018 high-resolution chest CT. 2. Stable mild cardiomegaly and moderate pericardial effusion. Findings are categorized as probable UIP per consensus guidelines: Diagnosis of Idiopathic Pulmonary Fibrosis: An  Official ATS/ERS/JRS/ALAT Clinical Practice Guideline. [place], Iss 5, [phone], Apr 13 2017. 3. Three-vessel coronary atherosclerosis. 4. Stable mild mediastinal lymphadenopathy, compatible with benign reactive etiology.  Aortic Atherosclerosis (ICD10-I70.0).

## 2019-06-11 NOTE — Patient Instructions (Addendum)
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 -This was present in 2011 and worse in the scan in February 2020 and subsequently again worsen the scan in October 2020  Plan  -I think he would benefit from class of medications called antifibrotic  -We discussed the options of nintedanib which is a theoretical bleeding risk versus pirfenidone which has a 10% incidence of skin side effects.  Both have GI side effects and require liver function test monitoring  -Based on our discussion you chose to take pirfenidone excepting the skin side effects and agreeing to wear sunscreen.  You do not want to do nintedanib because of the fact you are on Plavix and Eliquis   -Fill out the paperwork for this  - refer to [facility] for ILD-PRO registry study - consent given 06/11/2019 -  Highly encourage you to participate so we can learn and discover more about challenging problems like yours.    Snoring Morbid obesity with BMI of 45.0-49.9, adult [facility])  -Refer Dr. Elsworth Soho or Dr. Leretha Pol in our office for undiagnosed sleep apnea -Ultimately will have to figure out a weight loss plan  Lupus ([place])  -I think her lupus is very active and would recommend Dr. Amil Amen to handle therapy with prednisone and Imuran and consider alternatives.  I will call his office  Pericardial effusion  -Is described as moderate on the CT scan in October 2020. Same as feb 2020. Per Dr. Virgina Jock this is stable and he is monitoring it.   Pulmonary hypertension -seen on right heart catheterization May 2020 but associated with high wedge pressure  --This was seen in the heart catheterization in May 2020 and was at that time because of excess fluid in the body -At some point will recommend Dr. Virgina Jock to repeat this procedure to see if lupus is causing this problem directly as opposed to fluid -   I have d/w him 06/11/2019   Follow-up  -4-6  weeks ILD clinic 30-minute slot -

## 2019-06-12 ENCOUNTER — Other Ambulatory Visit: Payer: 59

## 2019-06-12 ENCOUNTER — Telehealth: Payer: Self-pay | Admitting: Pharmacist

## 2019-06-12 DIAGNOSIS — J8489 Other specified interstitial pulmonary diseases: Secondary | ICD-10-CM

## 2019-06-12 DIAGNOSIS — M359 Systemic involvement of connective tissue, unspecified: Secondary | ICD-10-CM

## 2019-06-12 NOTE — Telephone Encounter (Signed)
Received paperwork for Esbriet new start.  Will start benefit investigation and update when we hear a response.  Mariella Saa, PharmD, [place], [place] Clinical Specialty Pharmacist [phone]  06/12/2019 4:46 PM

## 2019-06-15 ENCOUNTER — Other Ambulatory Visit: Payer: Self-pay | Admitting: Nurse Practitioner

## 2019-06-15 DIAGNOSIS — I10 Essential (primary) hypertension: Secondary | ICD-10-CM

## 2019-06-15 NOTE — Telephone Encounter (Signed)
Please approve if appropriate Last creatinine was elevated 03/12/2019

## 2019-06-16 NOTE — Telephone Encounter (Signed)
Received notification from [facility] regarding a prior authorization for Rand.  Approval letter with approval dates will be faxed to the office. Patient must fill through [facility].  Authorization # [id number]  Submitted application for patient to receive an Esbriet $5 Copay Card. Patient will receive info in the mail. Ref# [id number]  Esbriet Phone# [phone]  2:50 PM Beatriz Chancellor, CPhT

## 2019-06-17 ENCOUNTER — Ambulatory Visit: Payer: 59 | Admitting: Pulmonary Disease

## 2019-06-20 ENCOUNTER — Other Ambulatory Visit: Payer: Self-pay | Admitting: Cardiology

## 2019-06-22 MED ORDER — ESBRIET 267 MG PO TABS: ORAL_TABLET | ORAL | 0 refills | Status: DC

## 2019-06-22 NOTE — Telephone Encounter (Signed)
Patient notified of approval.  She must fill at [facility] per insurance.Couseled patient on specialty pharmacy process and that the medication would be mailed to her house.  Prescription send to [facility] pharmacy. Gave patient phone number to [facility] to schedule 1st shipment.  We were unable to determine co-pay due to pharmacy lock.  She has Pharmacist, community and is co-pay card eligible.  Directed patient to [email or web address] to enroll for co-pay card if needed.  Reviewed starter dosing of 1 tablet (267 mg) three times daily for 7 days, then 2 tablets three times daily for 7 days, then 3 tablets three times daily.  Stressed importance of taking with food to minimize GI side effects.  Advised that maintenance dose will be one 801 mg tablet three times daily.  Patient verbalized understanding.  All questions encouraged and answered.  Instructed patient to call with any other questions or concerns.  Mariella Saa, PharmD, [place], [place] Clinical Specialty Pharmacist [phone]  06/22/2019 2:53 PM

## 2019-06-25 ENCOUNTER — Encounter: Payer: Self-pay | Admitting: Nurse Practitioner

## 2019-06-25 MED ORDER — ONETOUCH VERIO W/DEVICE KIT: 1.0000 | PACK | Freq: Three times a day (TID) | 0 refills | Status: AC

## 2019-06-25 MED ORDER — ONETOUCH DELICA LANCETS 33G MISC: 1.0000 | Freq: Three times a day (TID) | 12 refills | Status: AC

## 2019-06-25 MED ORDER — ONETOUCH DELICA LANCING DEV MISC: 1.0000 | Freq: Three times a day (TID) | 1 refills | Status: AC

## 2019-06-25 MED ORDER — ONETOUCH VERIO VI STRP: ORAL_STRIP | 12 refills | Status: DC

## 2019-07-02 ENCOUNTER — Ambulatory Visit: Payer: 59 | Attending: Nurse Practitioner | Admitting: Pharmacist

## 2019-07-02 ENCOUNTER — Telehealth: Payer: Self-pay | Admitting: Obstetrics & Gynecology

## 2019-07-02 ENCOUNTER — Other Ambulatory Visit: Payer: Self-pay

## 2019-07-02 DIAGNOSIS — E1165 Type 2 diabetes mellitus with hyperglycemia: Secondary | ICD-10-CM | POA: Diagnosis not present

## 2019-07-02 NOTE — Progress Notes (Signed)
    S:     No chief complaint on file.  Patient arrives in good spirits.  Presents for diabetes evaluation, education, and management Patient was referred and last seen by Primary Care Provider on 06/05/19.   Patient reports Diabetes was diagnosed in 2015. Has been on insulin since 2019.  Family/Social History:  - FHX: DM, HTN - Never smoker - Occasionally uses alcohol  Insurance coverage/medication affordability: [employer]  Patient reports adherence with medications.  Current diabetes medications include: Basaglar 20 units daily, Novolog 10 units TID, Victoza 1.8 mg daily Current hypertension medications include: diltizaem 240 mg CD daily, furosemide 40 mg BID, losartam 50 mg daily Current hyperlipidemia medications include: rosuvastatin 20 mg daily  Patient denies hypoglycemic events.  Patient reported dietary habits:  - Denies adherence to a diabetic diet  Patient-reported exercise habits:  - Denies    Patient denies nocturia (nighttime urination).  Patient denies neuropathy (nerve pain). Patient denies visual changes. Patient reports self foot exams.    O:  POCT glucose: 148  Lab Results  Component Value Date   HGBA1C 13.2 (A) 06/05/2019   There were no vitals filed for this visit.  Lipid Panel     Component Value Date/Time   CHOL 154 06/05/2019 1436   TRIG 166 (H) 06/05/2019 1436   HDL 46 06/05/2019 1436   CHOLHDL 3.3 06/05/2019 1436   CHOLHDL 4.8 08/18/2008 1746   VLDL 20 08/18/2008 1746   LDLCALC 80 06/05/2019 1436   Home blood sugars averages  7 day avg: 126 14 day avg: 127   Clinical Atherosclerotic Cardiovascular Disease (ASCVD): CAD  A/P: Diabetes longstanding currently uncontrolled but home sugars reveal improvement since addition of Victoza and Novolog. Patient is able to verbalize appropriate hypoglycemia management plan. Patient is adherent with medication. Control is suboptimal due to dietary indiscretion and physical inactivity.  In addition, she takes daily prednisone for Lupus.  -Continued current regimen for now.  - Commended pt on BG control at home. -Extensively discussed pathophysiology of diabetes, recommended lifestyle interventions, dietary effects on blood sugar control -Counseled on s/sx of and management of hypoglycemia -Next A1C anticipated 08/2019.   ASCVD risk - secondary prevention in patient with diabetes + CAD. Last LDL is not <70. High intensity statin indicated.  -Continued rosuvastatin 20 mg.   HM: UTD on influenza, tetanus, and PNA vaccines.   Written patient instructions provided.  Total time in face to face counseling 15 minutes.   Follow up Pharmacist Clinic Visit in 1 month.  Benard Halsted, PharmD, [place] [phone]

## 2019-07-02 NOTE — Telephone Encounter (Signed)
Attempted to call patient about her appointment on 11/20 @ 9:15. No answer left voicemail instructing patient to wear a face mask for the entire appointment and no visitors are allowed during the visit. Patient instructed not to attend the appointment if she was any symptoms. Symptom list and office number left.

## 2019-07-03 ENCOUNTER — Encounter: Payer: Self-pay | Admitting: Family Medicine

## 2019-07-03 ENCOUNTER — Ambulatory Visit: Payer: 59 | Admitting: Obstetrics & Gynecology

## 2019-07-03 ENCOUNTER — Encounter: Payer: Self-pay | Admitting: Pharmacist

## 2019-07-03 LAB — GLUCOSE, POCT (MANUAL RESULT ENTRY): POC Glucose: 148 mg/dl — AB (ref 70–99)

## 2019-07-03 NOTE — Patient Instructions (Signed)
Thank you for coming to see me today. Please do the following:  1. Continue current medications.  2. Continue checking blood sugars at home. 3. Continue making the lifestyle changes we've discussed together during our visit. Diet and exercise play a significant role in improving your blood sugars.  4. Follow-up with me in 1 month.   Hypoglycemia or low blood sugar:   Low blood sugar can happen quickly and may become an emergency if not treated right away.   While this shouldn't happen often, it can be brought upon if you skip a meal or do not eat enough. Also, if your insulin or other diabetes medications are dosed too high, this can cause your blood sugar to go to low.   Warning signs of low blood sugar include: 1. Feeling shaky or dizzy 2. Feeling weak or tired  3. Excessive hunger 4. Feeling anxious or upset  5. Sweating even when you aren't exercising  What to do if I experience low blood sugar? 1. Check your blood sugar with your meter. If lower than 70, proceed to step 2.  2. Treat with 3-4 glucose tablets or 3 packets of regular sugar. If these aren't around, you can try hard candy. Yet another option would be to drink 4 ounces of fruit juice or 6 ounces of REGULAR soda.  3. Re-check your sugar in 15 minutes. If it is still below 70, do what you did in step 2 again. If has come back up, go ahead and eat a snack or small meal at this time.

## 2019-07-06 ENCOUNTER — Encounter (HOSPITAL_COMMUNITY): Payer: Self-pay | Admitting: Emergency Medicine

## 2019-07-06 ENCOUNTER — Emergency Department (HOSPITAL_COMMUNITY): Payer: 59

## 2019-07-06 ENCOUNTER — Emergency Department (HOSPITAL_COMMUNITY)
Admission: EM | Admit: 2019-07-06 | Discharge: 2019-07-07 | Disposition: A | Discharge status: Home / Self Care | Payer: 59 | Attending: Emergency Medicine | Admitting: Emergency Medicine

## 2019-07-06 DIAGNOSIS — E119 Type 2 diabetes mellitus without complications: Secondary | ICD-10-CM | POA: Diagnosis not present

## 2019-07-06 DIAGNOSIS — I4891 Unspecified atrial fibrillation: Secondary | ICD-10-CM | POA: Insufficient documentation

## 2019-07-06 DIAGNOSIS — I110 Hypertensive heart disease with heart failure: Secondary | ICD-10-CM | POA: Insufficient documentation

## 2019-07-06 DIAGNOSIS — Z7901 Long term (current) use of anticoagulants: Secondary | ICD-10-CM | POA: Insufficient documentation

## 2019-07-06 DIAGNOSIS — I5032 Chronic diastolic (congestive) heart failure: Secondary | ICD-10-CM | POA: Insufficient documentation

## 2019-07-06 DIAGNOSIS — Z91010 Allergy to peanuts: Secondary | ICD-10-CM | POA: Insufficient documentation

## 2019-07-06 DIAGNOSIS — Z79899 Other long term (current) drug therapy: Secondary | ICD-10-CM | POA: Insufficient documentation

## 2019-07-06 DIAGNOSIS — Z794 Long term (current) use of insulin: Secondary | ICD-10-CM | POA: Insufficient documentation

## 2019-07-06 DIAGNOSIS — I2510 Atherosclerotic heart disease of native coronary artery without angina pectoris: Secondary | ICD-10-CM | POA: Insufficient documentation

## 2019-07-06 DIAGNOSIS — R079 Chest pain, unspecified: Secondary | ICD-10-CM

## 2019-07-06 DIAGNOSIS — R0789 Other chest pain: Secondary | ICD-10-CM | POA: Diagnosis present

## 2019-07-06 LAB — BASIC METABOLIC PANEL
Anion gap: 10 (ref 5–15)
BUN: 9 mg/dL (ref 6–20)
CO2: 25 mmol/L (ref 22–32)
Calcium: 9.1 mg/dL (ref 8.9–10.3)
Chloride: 104 mmol/L (ref 98–111)
Creatinine, Ser: 0.79 mg/dL (ref 0.44–1.00)
GFR calc Af Amer: >60 mL/min (ref >60)
GFR calc non Af Amer: >60 mL/min (ref >60)
Glucose, Bld: 106 mg/dL — ABNORMAL HIGH (ref 70–99)
Potassium: 2.8 mmol/L — ABNORMAL LOW (ref 3.5–5.1)
Sodium: 139 mmol/L (ref 135–145)

## 2019-07-06 LAB — TROPONIN I (HIGH SENSITIVITY)
Troponin I (High Sensitivity): 36 ng/L — ABNORMAL HIGH (ref <18)
Troponin I (High Sensitivity): 40 ng/L — ABNORMAL HIGH (ref <18)

## 2019-07-06 LAB — CBC
HCT: 37.5 % (ref 36.0–46.0)
Hemoglobin: 11.9 g/dL — ABNORMAL LOW (ref 12.0–15.0)
MCH: 25 pg — ABNORMAL LOW (ref 26.0–34.0)
MCHC: 31.7 g/dL (ref 30.0–36.0)
MCV: 78.8 fL — ABNORMAL LOW (ref 80.0–100.0)
Platelets: 321 10*3/uL (ref 150–400)
RBC: 4.76 MIL/uL (ref 3.87–5.11)
RDW: 14.5 % (ref 11.5–15.5)
WBC: 4 10*3/uL (ref 4.0–10.5)
nRBC: 0 % (ref 0.0–0.2)

## 2019-07-06 LAB — I-STAT BETA HCG BLOOD, ED (MC, WL, AP ONLY): I-stat hCG, quantitative: <5 m[IU]/mL (ref <5)

## 2019-07-06 MED ORDER — POTASSIUM CHLORIDE CRYS ER 20 MEQ PO TBCR
40.0000 meq | EXTENDED_RELEASE_TABLET | Freq: Once | ORAL | Status: AC
  Administered 2019-07-07: 40 meq via ORAL
  Filled 2019-07-06: qty 2

## 2019-07-06 NOTE — ED Triage Notes (Signed)
Pt reports intermittent sharp left sided chest pain. Reports generalized body aches.

## 2019-07-07 ENCOUNTER — Other Ambulatory Visit: Payer: Self-pay

## 2019-07-07 ENCOUNTER — Telehealth: Payer: Self-pay

## 2019-07-07 MED ORDER — METOPROLOL TARTRATE 25 MG PO TABS
25.0000 mg | ORAL_TABLET | Freq: Once | ORAL | Status: AC
  Administered 2019-07-07: 25 mg via ORAL
  Filled 2019-07-07: qty 1

## 2019-07-07 MED ORDER — METOPROLOL TARTRATE 5 MG/5ML IV SOLN
5.0000 mg | Freq: Once | INTRAVENOUS | Status: AC
  Administered 2019-07-07: 5 mg via INTRAVENOUS
  Filled 2019-07-07: qty 5

## 2019-07-07 MED ORDER — METOPROLOL TARTRATE 50 MG PO TABS: 50.0000 mg | ORAL_TABLET | Freq: Two times a day (BID) | ORAL | 1 refills | Status: DC

## 2019-07-07 NOTE — ED Notes (Signed)
Discharge instructions discussed with Pt. Pt verbalized understanding. Pt stable and ambulatory.    

## 2019-07-07 NOTE — Telephone Encounter (Signed)
Not sure why its not on the list. She is very much on it. It was there on her office note from me in August. Please refill metoprolol tartarate 50 mg bid.  Thanks MJP

## 2019-07-07 NOTE — ED Provider Notes (Signed)
[facility] EMERGENCY DEPARTMENT Provider Note   CSN: [id number] Arrival date & time: 07/06/19  1624     History   Chief Complaint Chief Complaint  Patient presents with  . Chest Pain    HPI [name] is a 43 y.o. female.     Patient presents to the emergency department for evaluation of chest pain.  Patient reports a dull aching pain in the left chest with intermittent episodes of sharp pain.  She reports that she does frequently have the dull aching chest pain, but tonight the sharp pain was a new component for her.  She has not had any increased shortness of breath.  She does have a history of chronic lung disease secondary to lupus.     Past Medical History:  Diagnosis Date  . Abnormal Pap smear of cervix    colpo, HPV  . Allergy   . Anemia   . Atrial fibrillation ([place])   . CHF (congestive heart failure) ([place])   . Depression    after losses  . Enlarged heart    managed by cardiology  . Fibroid   . Gestational diabetes   . Heart murmur   . Hypertension   . Infection    UTI  . Lupus ([place]) 1999    Patient Active Problem List   Diagnosis Date Noted  . ILD (interstitial lung disease) ([place]) 01/12/2019  . Coronary artery disease 12/12/2018  . Nonrheumatic aortic valve stenosis 11/19/2018  . Postinflammatory pulmonary fibrosis ([place]) 09/25/2018  . Hypokalemia 09/17/2018  . AKI (acute kidney injury) ([place]) 08/25/2018  . Encounter for annual routine gynecological examination 07/09/2018  . Abnormal vaginal Pap smear 07/09/2018  . Depo-Provera contraceptive status 07/09/2018  . Paroxysmal atrial fibrillation ([place]) 05/12/2018  . Atrial flutter ([place]) 05/12/2018  . Pericardial effusion 01/16/2018  . Chronic diastolic CHF (congestive heart failure) ([place]) 01/16/2018  . SOB (shortness of breath) 12/25/2017  . Hypertension 12/25/2017  . Microcytic anemia 12/25/2017  . Type 2 diabetes mellitus with hyperglycemia, without long-term current use  of insulin ([place]) 12/25/2017  . Chronic pain of both knees 04/23/2017  . Fibroids 01/20/2015  . Menorrhagia 01/20/2015  . Low grade squamous intraepithelial lesion (LGSIL) on cervical Pap smear on 01/20/15 01/20/2015  . Atypical glandular cells on cervical Pap smear on 01/20/15 01/20/2015  . Lupus ([place]) 07/15/2013    Past Surgical History:  Procedure Laterality Date  . A-FLUTTER ABLATION N/A 06/10/2018   Procedure: A-FLUTTER ABLATION;  Surgeon: Evans Lance, MD;  Location: [place];  Service: Cardiovascular;  Laterality: N/A;  . CARDIAC CATHETERIZATION    . CORONARY STENT INTERVENTION N/A 12/12/2018   Procedure: CORONARY STENT INTERVENTION;  Surgeon: [name], MD;  Location: [place] [facility];  Service: Cardiovascular;  Laterality: N/A;  lad   . RIGHT/LEFT HEART CATH AND CORONARY ANGIOGRAPHY N/A 03/14/2018   Procedure: RIGHT/LEFT HEART CATH AND CORONARY ANGIOGRAPHY;  Surgeon: [name], MD;  Location: [facility];  Service: Cardiovascular;  Laterality: N/A;  . RIGHT/LEFT HEART CATH AND CORONARY ANGIOGRAPHY N/A 12/12/2018   Procedure: RIGHT/LEFT HEART CATH AND CORONARY ANGIOGRAPHY;  Surgeon: [name], MD;  Location: [facility];  Service: Cardiovascular;  Laterality: N/A;  . THERAPEUTIC ABORTION       OB History    Gravida  5   Para  2   Term  0   Preterm  2   AB  3   Living  2  SAB  2   TAB  1   Ectopic  0   Multiple  0   Live Births  2            Home Medications    Prior to Admission medications   Medication Sig Start Date End Date Taking? Authorizing Provider  acetaminophen (TYLENOL) 500 MG tablet Take 1,000 mg by mouth every 6 (six) hours as needed for headache (pain).    [provider]  albuterol (PROVENTIL HFA;VENTOLIN HFA) 108 (90 Base) MCG/ACT inhaler Inhale 2 puffs into the lungs every 6 (six) hours as needed for wheezing or shortness of breath. 11/17/18   Gildardo Pounds, NP  apixaban  (ELIQUIS) 5 MG TABS tablet Take 1 tablet (5 mg total) by mouth 2 (two) times daily. 12/12/18   Patwardhan, Reynold Bowen, MD  azaTHIOprine (IMURAN) 50 MG tablet Take 50 mg by mouth 2 (two) times daily.    [provider]  benzonatate (TESSALON) 200 MG capsule Take 1 capsule (200 mg total) by mouth 3 (three) times daily as needed for cough. 08/27/18   Mikhail, Velta Addison, DO  Blood Glucose Monitoring Suppl (ONETOUCH VERIO) w/Device KIT 1 each by Does not apply route 3 (three) times daily. 06/25/19   Gildardo Pounds, NP  cetirizine (ZYRTEC) 10 MG tablet Take 1 tablet (10 mg total) by mouth daily. 11/17/18 05/25/19  Gildardo Pounds, NP  clopidogrel (PLAVIX) 75 MG tablet Take 1 tablet (75 mg total) by mouth daily with breakfast. 12/13/18   Patwardhan, Reynold Bowen, MD  colchicine 0.6 MG tablet Take 1 tablet (0.6 mg total) by mouth daily. 12/16/18   Patwardhan, Reynold Bowen, MD  cyclobenzaprine (FLEXERIL) 10 MG tablet Take 1 tablet (10 mg total) by mouth 2 (two) times daily as needed. 04/13/19   Fawze, Mina A, PA-C  diltiazem (CARDIZEM CD) 240 MG 24 hr capsule Take 1 capsule (240 mg total) by mouth daily. 05/07/19   Patwardhan, Reynold Bowen, MD  furosemide (LASIX) 40 MG tablet TAKE 1 TABLET (40 MG TOTAL) BY MOUTH 2 (TWO) TIMES DAILY. 06/15/19   Gildardo Pounds, NP  glucose blood [facility] VERIO) test strip Use as instructed 06/25/19   Gildardo Pounds, NP  hydroxychloroquine (PLAQUENIL) 200 MG tablet Take 1 tablet (200 mg total) by mouth 2 (two) times daily. 01/18/18   Rai, Ripudeep K, MD  insulin aspart (NOVOLOG FLEXPEN) 100 UNIT/ML FlexPen Inject 10 Units into the skin 3 (three) times daily with meals. 06/05/19 09/03/19  Gildardo Pounds, NP  Insulin Glargine (BASAGLAR KWIKPEN) 100 UNIT/ML SOPN Inject 0.2 mLs (20 Units total) into the skin at bedtime. 06/05/19 09/03/19  Gildardo Pounds, NP  Insulin Pen Needle (TRUEPLUS PEN NEEDLES) 32G X 4 MM MISC Use as directed to inject insulin 5x daily 06/05/19   Gildardo Pounds, NP   Lancet Devices (ONE TOUCH DELICA LANCING DEV) MISC 1 each by Does not apply route 3 (three) times daily. 06/25/19   Gildardo Pounds, NP  lidocaine (LIDODERM) 5 % Place 1 patch onto the skin daily. Remove & Discard patch within 12 hours or as directed by MD 04/13/19   Rodell Perna A, PA-C  liraglutide (VICTOZA) 18 MG/3ML SOPN SubQ:  Inject 0.6 mg once daily into the skin. Week 2: increase to 1.2 mg once daily; week 3: increase to 1.8 mg once daily 06/05/19   Gildardo Pounds, NP  losartan (COZAAR) 50 MG tablet Take 1 tablet (50 mg total) by mouth daily. 05/07/19 08/05/19  Patwardhan, Manish J, MD  medroxyPROGESTERone (DEPO-PROVERA) 150 MG/ML injection Inject 150 mg into the muscle every 3 (three) months.    [provider]  methocarbamol (ROBAXIN) 500 MG tablet Take 2 tablets (1,000 mg total) by mouth every 8 (eight) hours as needed for muscle spasms. 03/05/19   Argentina Donovan, PA-C  OneTouch Delica Lancets 89V MISC 1 each by Does not apply route 3 (three) times daily. 06/25/19   Gildardo Pounds, NP  pantoprazole (PROTONIX) 40 MG tablet Take 1 tablet (40 mg total) by mouth daily. 06/08/19 09/06/19  Gildardo Pounds, NP  Phenyleph-CPM-DM-Aspirin (ALKA-SELTZER PLUS COLD & COUGH PO) Take 2 tablets by mouth every 4 (four) hours as needed (cough/congestion).    [provider]  Pirfenidone (ESBRIET) 267 MG TABS Take 1 tablet (267 mg total) by mouth 3 (three) times daily for 7 days, THEN 2 tablets (534 mg total) 3 (three) times daily for 7 days, THEN 3 tablets (801 mg total) 3 (three) times daily for 16 days. 06/22/19 07/22/19  Brand Males, MD  potassium chloride SA (K-DUR) 20 MEQ tablet Take 2 tablets (40 mEq total) by mouth daily. 12/16/18   Patwardhan, Reynold Bowen, MD  predniSONE (DELTASONE) 5 MG tablet Take 1.5 tablets (7.5 mg total) by mouth daily with breakfast. 03/09/19   Magdalen Spatz, NP  rosuvastatin (CRESTOR) 20 MG tablet Take 1 tablet (20 mg total) by mouth daily at 6 PM. 06/05/19    Gildardo Pounds, NP  traMADol (ULTRAM) 50 MG tablet Take 1 tablet by mouth as needed. 02/16/19   [provider]    Family History Family History  Problem Relation Age of Onset  . Cancer Maternal Grandfather        lung  . Diabetes Mother   . Hypertension Mother   . Hypertension Maternal Grandmother   . Diabetes Father   . Hearing loss Neg Hx     Social History Social History   Tobacco Use  . Smoking status: Never Smoker  . Smokeless tobacco: Never Used  Substance Use Topics  . Alcohol use: Yes    Comment: ocasionally   . Drug use: No     Allergies   Celery oil, Peanut-containing drug products, and Septra [bactrim]   Review of Systems Review of Systems  Cardiovascular: Positive for chest pain.  All other systems reviewed and are negative.    Physical Exam Updated Vital Signs BP 128/71   Pulse (!) 101   Temp 98.6 F (37 C) (Oral)   Resp 19   SpO2 100%   Physical Exam Vitals signs and nursing note reviewed.  Constitutional:      General: She is not in acute distress.    Appearance: Normal appearance. She is well-developed.  HENT:     Head: Normocephalic and atraumatic.     Right Ear: Hearing normal.     Left Ear: Hearing normal.     Nose: Nose normal.  Eyes:     Conjunctiva/sclera: Conjunctivae normal.     Pupils: Pupils are equal, round, and reactive to light.  Neck:     Musculoskeletal: Normal range of motion and neck supple.  Cardiovascular:     Rate and Rhythm: Tachycardia present. Rhythm irregular.     Heart sounds: S1 normal and S2 normal. No murmur. No friction rub. No gallop.   Pulmonary:     Effort: Pulmonary effort is normal. No respiratory distress.     Breath sounds: Normal breath sounds.  Chest:  Chest wall: No tenderness.  Abdominal:     General: Bowel sounds are normal.     Palpations: Abdomen is soft.     Tenderness: There is no abdominal tenderness. There is no guarding or rebound. Negative signs include Murphy's  sign and McBurney's sign.     Hernia: No hernia is present.  Musculoskeletal: Normal range of motion.  Skin:    General: Skin is warm and dry.     Findings: No rash.  Neurological:     Mental Status: She is alert and oriented to person, place, and time.     GCS: GCS eye subscore is 4. GCS verbal subscore is 5. GCS motor subscore is 6.     Cranial Nerves: No cranial nerve deficit.     Sensory: No sensory deficit.     Coordination: Coordination normal.  Psychiatric:        Speech: Speech normal.        Behavior: Behavior normal.        Thought Content: Thought content normal.      ED Treatments / Results  Labs (all labs ordered are listed, but only abnormal results are displayed) Labs Reviewed  BASIC METABOLIC PANEL - Abnormal; Notable for the following components:      Result Value   Potassium 2.8 (*)    Glucose, Bld 106 (*)    All other components within normal limits  CBC - Abnormal; Notable for the following components:   Hemoglobin 11.9 (*)    MCV 78.8 (*)    MCH 25.0 (*)    All other components within normal limits  TROPONIN I (HIGH SENSITIVITY) - Abnormal; Notable for the following components:   Troponin I (High Sensitivity) 36 (*)    All other components within normal limits  TROPONIN I (HIGH SENSITIVITY) - Abnormal; Notable for the following components:   Troponin I (High Sensitivity) 40 (*)    All other components within normal limits  I-STAT BETA HCG BLOOD, ED ([facility], [facility], [facility] ONLY)    EKG EKG Interpretation  Date/Time:  Monday July 06 2019 16:31:33 EST Ventricular Rate:  121 PR Interval:    QRS Duration: 96 QT Interval:  358 QTC Calculation: 508 R Axis:   138 Text Interpretation: Atrial fibrillation with rapid ventricular response Right axis deviation Incomplete right bundle branch block Possible Right ventricular hypertrophy Anterior infarct , age undetermined T wave abnormality, consider inferior ischemia Abnormal ECG No significant change since last  tracing Confirmed by Orpah Greek [phone]) on 07/06/2019 11:48:24 PM   Radiology Dg Chest 2 View  Result Date: 07/06/2019 CLINICAL DATA:  43 year old female with chest pain. EXAM: CHEST - 2 VIEW COMPARISON:  Chest radiograph dated 04/13/2019 and chest CT dated 12/11/2018. FINDINGS: There is enlargement of the cardiopericardial silhouette in keeping with pericardial effusion. There is mild vascular congestion similar to prior radiograph. No focal consolidation, pleural effusion, or pneumothorax. No acute osseous pathology. IMPRESSION: Enlarged cardiopericardial silhouette with mild vascular congestion. No significant interval change since the prior radiograph. Electronically Signed   By: Anner Crete M.D.   On: 07/06/2019 17:22    Procedures Procedures (including critical care time)  Medications Ordered in ED Medications  metoprolol tartrate (LOPRESSOR) tablet 25 mg (has no administration in time range)  potassium chloride SA (KLOR-CON) CR tablet 40 mEq (40 mEq Oral Given 07/07/19 0022)  metoprolol tartrate (LOPRESSOR) injection 5 mg (5 mg Intravenous Given 07/07/19 0040)     Initial Impression / Assessment and Plan / ED Course  I have reviewed the triage vital signs and the nursing notes.  Pertinent labs & imaging results that were available during my care of the patient were reviewed by me and considered in my medical decision making (see chart for details).        Patient presents to the emergency department for evaluation of chest pain.  Patient does have a history of coronary artery disease, had stenting of her LAD performed in May of this year.  Pain today is different than the pain she experienced with her previous cardiac pain.  Patient does have multiple other medical problems including lupus.  There is no shortness of breath or hypoxia.  She is on Eliquis because of a history of paroxysmal atrial fibrillation, has not missed any doses.  Doubt PE.  Patient was  noted to be in atrial fibrillation today.  She takes Lopressor for rate control.  Heart rate was elevated here, given 5 mg of Lopressor IV with improvement.  Will give additional oral dose of Lopressor.  Patient's troponin is elevated.  She appears to have some chronic elevation in the range of 40 looking at her previous records.  Initial troponin was 36, second troponin was 40.  This was discussed with Dr. Virgina Jock, her cardiologist.  He knows her quite well and feels that the troponins are secondary to her atrial fibrillation and not cause for further work-up at this time.  He did not feel the patient required hospitalization.  I discussed this with the patient and she would actually like to go home as well.  She will follow-up in the office with Dr. Virgina Jock, understands that she can return at any time if she has any worsening symptoms.  Final Clinical Impressions(s) / ED Diagnoses   Final diagnoses:  Chest pain, unspecified type  Atrial fibrillation with [facility])    ED Discharge Orders    None       Orpah Greek, MD 07/07/19 0155

## 2019-07-07 NOTE — Telephone Encounter (Signed)
Metoprolol tartate 50 mg BID was sent to pt's pharmacy and added back on her med list, Per MP

## 2019-07-07 NOTE — Telephone Encounter (Signed)
Telephone encounter:  Reason for call:The patients phramacy called and asked about refilling her Metoprolol, I dont see that this was on her list of meds. Per the patient she has been taking this medicine since her Cath, please advise.   Usual provider: mp  Last office visit: 03/19/19  Next office visit: 07/23/19   Last hospitalization: 07/06/19   Current Outpatient Medications on File Prior to Visit  Medication Sig Dispense Refill  . acetaminophen (TYLENOL) 500 MG tablet Take 1,000 mg by mouth every 6 (six) hours as needed for headache (pain).    Marland Kitchen albuterol (PROVENTIL HFA;VENTOLIN HFA) 108 (90 Base) MCG/ACT inhaler Inhale 2 puffs into the lungs every 6 (six) hours as needed for wheezing or shortness of breath. 1 Inhaler 2  . apixaban (ELIQUIS) 5 MG TABS tablet Take 1 tablet (5 mg total) by mouth 2 (two) times daily. 60 tablet 2  . azaTHIOprine (IMURAN) 50 MG tablet Take 50 mg by mouth 2 (two) times daily.    . benzonatate (TESSALON) 200 MG capsule Take 1 capsule (200 mg total) by mouth 3 (three) times daily as needed for cough. 30 capsule 0  . Blood Glucose Monitoring Suppl (ONETOUCH VERIO) w/Device KIT 1 each by Does not apply route 3 (three) times daily. 1 kit 0  . cetirizine (ZYRTEC) 10 MG tablet Take 1 tablet (10 mg total) by mouth daily. 90 tablet 1  . clopidogrel (PLAVIX) 75 MG tablet Take 1 tablet (75 mg total) by mouth daily with breakfast. 60 tablet 2  . colchicine 0.6 MG tablet Take 1 tablet (0.6 mg total) by mouth daily. 60 tablet 1  . cyclobenzaprine (FLEXERIL) 10 MG tablet Take 1 tablet (10 mg total) by mouth 2 (two) times daily as needed. 10 tablet 0  . diltiazem (CARDIZEM CD) 240 MG 24 hr capsule Take 1 capsule (240 mg total) by mouth daily. 90 capsule 2  . furosemide (LASIX) 40 MG tablet TAKE 1 TABLET (40 MG TOTAL) BY MOUTH 2 (TWO) TIMES DAILY. 60 tablet 2  . glucose blood (ONETOUCH VERIO) test strip Use as instructed 100 each 12  . hydroxychloroquine (PLAQUENIL) 200 MG  tablet Take 1 tablet (200 mg total) by mouth 2 (two) times daily. 60 tablet 4  . insulin aspart (NOVOLOG FLEXPEN) 100 UNIT/ML FlexPen Inject 10 Units into the skin 3 (three) times daily with meals. 30 mL 1  . Insulin Glargine (BASAGLAR KWIKPEN) 100 UNIT/ML SOPN Inject 0.2 mLs (20 Units total) into the skin at bedtime. 40 mL 3  . Insulin Pen Needle (TRUEPLUS PEN NEEDLES) 32G X 4 MM MISC Use as directed to inject insulin 5x daily 200 each 3  . Lancet Devices (ONE TOUCH DELICA LANCING DEV) MISC 1 each by Does not apply route 3 (three) times daily. 1 each 1  . lidocaine (LIDODERM) 5 % Place 1 patch onto the skin daily. Remove & Discard patch within 12 hours or as directed by MD 30 patch 0  . liraglutide (VICTOZA) 18 MG/3ML SOPN SubQ:  Inject 0.6 mg once daily into the skin. Week 2: increase to 1.2 mg once daily; week 3: increase to 1.8 mg once daily 9 mL 2  . losartan (COZAAR) 50 MG tablet Take 1 tablet (50 mg total) by mouth daily. 90 tablet 3  . medroxyPROGESTERone (DEPO-PROVERA) 150 MG/ML injection Inject 150 mg into the muscle every 3 (three) months.    . methocarbamol (ROBAXIN) 500 MG tablet Take 2 tablets (1,000 mg total) by mouth every 8 (eight) hours  as needed for muscle spasms. 90 tablet 0  . OneTouch Delica Lancets 53U MISC 1 each by Does not apply route 3 (three) times daily. 100 each 12  . pantoprazole (PROTONIX) 40 MG tablet Take 1 tablet (40 mg total) by mouth daily. 90 tablet 1  . Phenyleph-CPM-DM-Aspirin (ALKA-SELTZER PLUS COLD & COUGH PO) Take 2 tablets by mouth every 4 (four) hours as needed (cough/congestion).    . Pirfenidone (ESBRIET) 267 MG TABS Take 1 tablet (267 mg total) by mouth 3 (three) times daily for 7 days, THEN 2 tablets (534 mg total) 3 (three) times daily for 7 days, THEN 3 tablets (801 mg total) 3 (three) times daily for 16 days. 207 tablet 0  . potassium chloride SA (K-DUR) 20 MEQ tablet Take 2 tablets (40 mEq total) by mouth daily. 90 tablet 2  . predniSONE (DELTASONE)  5 MG tablet Take 1.5 tablets (7.5 mg total) by mouth daily with breakfast. 90 tablet 0  . rosuvastatin (CRESTOR) 20 MG tablet Take 1 tablet (20 mg total) by mouth daily at 6 PM. 30 tablet 4  . traMADol (ULTRAM) 50 MG tablet Take 1 tablet by mouth as needed.     No current facility-administered medications on file prior to visit.

## 2019-07-13 ENCOUNTER — Other Ambulatory Visit: Payer: Self-pay | Admitting: Internal Medicine

## 2019-07-13 DIAGNOSIS — J8489 Other specified interstitial pulmonary diseases: Secondary | ICD-10-CM

## 2019-07-23 ENCOUNTER — Ambulatory Visit (INDEPENDENT_AMBULATORY_CARE_PROVIDER_SITE_OTHER): Payer: 59 | Admitting: Cardiology

## 2019-07-23 ENCOUNTER — Encounter: Payer: Self-pay | Admitting: Cardiology

## 2019-07-23 ENCOUNTER — Other Ambulatory Visit: Payer: Self-pay

## 2019-07-23 VITALS — BP 145/93 | HR 78 | Ht 60.0 in | Wt 228.7 lb

## 2019-07-23 DIAGNOSIS — I10 Essential (primary) hypertension: Secondary | ICD-10-CM | POA: Diagnosis not present

## 2019-07-23 DIAGNOSIS — I480 Paroxysmal atrial fibrillation: Secondary | ICD-10-CM | POA: Diagnosis not present

## 2019-07-23 NOTE — Progress Notes (Signed)
Subjective:   [name], female    DOB: [birth year], 43 y.o.   MRN: [id number]   Chief complaint:  Shortness of breath  HPI  43 year old female with long-standing SLE, hypertension, type 2 diabetes mellitus, CAD s/p pLAD PCI for critical stenosis (12/12/2018), paroxysmal Afib w/RVR, moderate aortic stenosis, stable pericardial effusion without tamponade or constriction, interstitial lung disease.  Patient was in emergency department on 07/07/2019 with complaints of chest pain.  She was thought to be in atrial fibrillation, rate was controlled after IV metoprolol 5 mg. However, closer review showed that she had multifocal atrial tachycardia.  High-sensitivity troponin was mildly related at around 40. Patient is seeing Dr. Chase Caller for pulmonary fibrosis.  She has been started on pirfenidone.  She will also be tested for obstructive sleep apnea.  Patient is here for follow-up. She has occasional episodes of chest pain, lasting for a few seconds, unrelated to exertion. Since starting treatment for pulmonary fibrosis, her dyspnea has improved. However, she continues to have recurrent lupus flares with rash and hand joint swelling.    Past Medical History:  Diagnosis Date  . Abnormal Pap smear of cervix    colpo, HPV  . Allergy   . Anemia   . Atrial fibrillation ([place])   . CHF (congestive heart failure) ([place])   . Depression    after losses  . Enlarged heart    managed by cardiology  . Fibroid   . Gestational diabetes   . Heart murmur   . Hypertension   . Infection    UTI  . Lupus ([place]) 1999     Past Surgical History:  Procedure Laterality Date  . A-FLUTTER ABLATION N/A 06/10/2018   Procedure: A-FLUTTER ABLATION;  Surgeon: Evans Lance, MD;  Location: [place] [facility];  Service: Cardiovascular;  Laterality: N/A;  . CARDIAC CATHETERIZATION    . CORONARY STENT INTERVENTION N/A 12/12/2018   Procedure: CORONARY STENT INTERVENTION;  Surgeon: [name], MD;   Location: [place] [facility];  Service: Cardiovascular;  Laterality: N/A;  lad   . RIGHT/LEFT HEART CATH AND CORONARY ANGIOGRAPHY N/A 03/14/2018   Procedure: RIGHT/LEFT HEART CATH AND CORONARY ANGIOGRAPHY;  Surgeon: [name], MD;  Location: [place] [facility];  Service: Cardiovascular;  Laterality: N/A;  . RIGHT/LEFT HEART CATH AND CORONARY ANGIOGRAPHY N/A 12/12/2018   Procedure: RIGHT/LEFT HEART CATH AND CORONARY ANGIOGRAPHY;  Surgeon: [name], MD;  Location: [place] [facility];  Service: Cardiovascular;  Laterality: N/A;  . THERAPEUTIC ABORTION       Social History   Socioeconomic History  . Marital status: Married    Spouse name: Not on file  . Number of children: 2  . Years of education: Not on file  . Highest education level: Not on file  Occupational History  . Not on file  Tobacco Use  . Smoking status: Never Smoker  . Smokeless tobacco: Never Used  Substance and Sexual Activity  . Alcohol use: Yes    Comment: ocasionally   . Drug use: No  . Sexual activity: Yes    Birth control/protection: Injection  Other Topics Concern  . Not on file  Social History Narrative  . Not on file   Social Determinants of Health   Financial Resource Strain:   . Difficulty of Paying Living Expenses: Not on file  Food Insecurity: Unknown  . Worried About Charity fundraiser in the Last Year: Patient refused  . Ran Out of Food  in the Last Year: Patient refused  Transportation Needs: Unknown  . Lack of Transportation (Medical): Patient refused  . Lack of Transportation (Non-Medical): Patient refused  Physical Activity: Unknown  . Days of Exercise per Week: Patient refused  . Minutes of Exercise per Session: Patient refused  Stress:   . Feeling of Stress : Not on file  Social Connections: Unknown  . Frequency of Communication with Friends and Family: Patient refused  . Frequency of Social Gatherings with Friends and Family: Patient refused  . Attends Religious  Services: Patient refused  . Active Member of Clubs or Organizations: Patient refused  . Attends Archivist Meetings: Patient refused  . Marital Status: Patient refused  Intimate Partner Violence: Unknown  . Fear of Current or Ex-Partner: Patient refused  . Emotionally Abused: Patient refused  . Physically Abused: Patient refused  . Sexually Abused: Patient refused     Current Outpatient Medications on File Prior to Visit  Medication Sig Dispense Refill  . acetaminophen (TYLENOL) 500 MG tablet Take 1,000 mg by mouth every 6 (six) hours as needed for headache (pain).    Marland Kitchen albuterol (PROVENTIL HFA;VENTOLIN HFA) 108 (90 Base) MCG/ACT inhaler Inhale 2 puffs into the lungs every 6 (six) hours as needed for wheezing or shortness of breath. 1 Inhaler 2  . apixaban (ELIQUIS) 5 MG TABS tablet Take 1 tablet (5 mg total) by mouth 2 (two) times daily. 60 tablet 2  . azaTHIOprine (IMURAN) 50 MG tablet Take 50 mg by mouth 2 (two) times daily.    . Blood Glucose Monitoring Suppl (ONETOUCH VERIO) w/Device KIT 1 each by Does not apply route 3 (three) times daily. 1 kit 0  . clopidogrel (PLAVIX) 75 MG tablet Take 1 tablet (75 mg total) by mouth daily with breakfast. 60 tablet 2  . colchicine 0.6 MG tablet Take 1 tablet (0.6 mg total) by mouth daily. 60 tablet 1  . cyclobenzaprine (FLEXERIL) 10 MG tablet Take 1 tablet (10 mg total) by mouth 2 (two) times daily as needed. 10 tablet 0  . diltiazem (CARDIZEM CD) 240 MG 24 hr capsule Take 1 capsule (240 mg total) by mouth daily. 90 capsule 2  . furosemide (LASIX) 40 MG tablet TAKE 1 TABLET (40 MG TOTAL) BY MOUTH 2 (TWO) TIMES DAILY. 60 tablet 2  . glucose blood (ONETOUCH VERIO) test strip Use as instructed 100 each 12  . hydroxychloroquine (PLAQUENIL) 200 MG tablet Take 1 tablet (200 mg total) by mouth 2 (two) times daily. 60 tablet 4  . insulin aspart (NOVOLOG FLEXPEN) 100 UNIT/ML FlexPen Inject 10 Units into the skin 3 (three) times daily with meals.  30 mL 1  . Insulin Glargine (BASAGLAR KWIKPEN) 100 UNIT/ML SOPN Inject 0.2 mLs (20 Units total) into the skin at bedtime. 40 mL 3  . Insulin Pen Needle (TRUEPLUS PEN NEEDLES) 32G X 4 MM MISC Use as directed to inject insulin 5x daily 200 each 3  . Lancet Devices (ONE TOUCH DELICA LANCING DEV) MISC 1 each by Does not apply route 3 (three) times daily. 1 each 1  . lidocaine (LIDODERM) 5 % Place 1 patch onto the skin daily. Remove & Discard patch within 12 hours or as directed by MD 30 patch 0  . liraglutide (VICTOZA) 18 MG/3ML SOPN SubQ:  Inject 0.6 mg once daily into the skin. Week 2: increase to 1.2 mg once daily; week 3: increase to 1.8 mg once daily 9 mL 2  . losartan (COZAAR) 50 MG tablet Take  1 tablet (50 mg total) by mouth daily. 90 tablet 3  . medroxyPROGESTERone (DEPO-PROVERA) 150 MG/ML injection Inject 150 mg into the muscle every 3 (three) months.    . methocarbamol (ROBAXIN) 500 MG tablet Take 2 tablets (1,000 mg total) by mouth every 8 (eight) hours as needed for muscle spasms. 90 tablet 0  . metoprolol tartrate (LOPRESSOR) 50 MG tablet Take 1 tablet (50 mg total) by mouth 2 (two) times daily. 180 tablet 1  . pantoprazole (PROTONIX) 40 MG tablet Take 1 tablet (40 mg total) by mouth daily. 90 tablet 1  . Phenyleph-CPM-DM-Aspirin (ALKA-SELTZER PLUS COLD & COUGH PO) Take 2 tablets by mouth every 4 (four) hours as needed (cough/congestion).    . Pirfenidone (ESBRIET) 267 MG TABS Take 1 tablet (267 mg total) by mouth 3 (three) times daily. 270 tablet 11  . rosuvastatin (CRESTOR) 20 MG tablet Take 1 tablet (20 mg total) by mouth daily at 6 PM. 30 tablet 4  . traMADol (ULTRAM) 50 MG tablet Take 1 tablet by mouth as needed.    . benzonatate (TESSALON) 200 MG capsule Take 1 capsule (200 mg total) by mouth 3 (three) times daily as needed for cough. (Patient not taking: Reported on 07/23/2019) 30 capsule 0  . cetirizine (ZYRTEC) 10 MG tablet Take 1 tablet (10 mg total) by mouth daily. 90 tablet 1  .  OneTouch Delica Lancets 62B MISC 1 each by Does not apply route 3 (three) times daily. 100 each 12  . potassium chloride SA (K-DUR) 20 MEQ tablet Take 2 tablets (40 mEq total) by mouth daily. (Patient not taking: Reported on 07/23/2019) 90 tablet 2  . predniSONE (DELTASONE) 5 MG tablet Take 1.5 tablets (7.5 mg total) by mouth daily with breakfast. (Patient not taking: Reported on 07/23/2019) 90 tablet 0   No current facility-administered medications on file prior to visit.    Cardiovascular studies:  EKG 07/23/2019: Sinus tachycardia 114 bpm Frequent PAC's. RIght axis deviation. Left posterior fascicular block.   Significant Diagnostic Studies:  CARDIAC STUDIES:  EKG 12/12/2018: Sinus rhythm, aberrant conducted PAC's  R/LHC, coronary angiography and intervention 12/12/2018: RA: 10 mmHg RV: 46/7 mmHg. RVEDP 8 mmHg PA: 48/22 mmHg. Mean PA 33 mmHg PW: 17 mmHg LV 155/7 mmHg. LVEDP 10 mmHg <10 mmHg LV-Ao pullback gradient CO 7.8 L/min. CI 4 L/min/m2  Simultaneous RV-LV pressure tracings do not show interdependence to suggest constriction physiology.  LM: Wall calcium with no significant stenosis LAD: Ostial-proximal 90% stenosis. Normal diagonal branches. Successful PTCA and stent placement pLAD Synergy DES 3.5X16 mm DES Post dilatation with 4.0 X 8 mm [place] balloon. 0% residual stenosis Ramus: Normal LCx: Normal RCA: Normal  Echocardiogram 12/10/2018: 1. The left ventricle has low normal systolic function, with an ejection fraction of 50-55%. No evidence of left ventricular regional wall motion abnormalities. 2. Moderate posteriorly located pericardial effusion with no signs of tamponade. 3. This is a limited study with no significant change compared to previous study on 11/20/2018.  Recent labs: Results for ZELPHIA, GLOVER (MRN [id number]) as of 12/16/2018 15:15  Ref. Range 12/11/2018 03:35 12/12/2018 03:27 12/12/2018 08:50 12/12/2018 61:60  BASIC METABOLIC PANEL Unknown  Rpt (A) Rpt (A)    Sodium Latest Ref Range: 135 - 145 mmol/L 136 138 142 142  Potassium Latest Ref Range: 3.5 - 5.1 mmol/L 3.7 4.5 3.4 (L) 3.4 (L)  Chloride Latest Ref Range: 98 - 111 mmol/L 104 106    CO2 Latest Ref Range: 22 - 32 mmol/L 21 (L)  20 (L)    Glucose Latest Ref Range: 70 - 99 mg/dL 119 (H) 119 (H)    BUN Latest Ref Range: 6 - 20 mg/dL 12 13    Creatinine Latest Ref Range: 0.44 - 1.00 mg/dL 0.88 0.95    Calcium Latest Ref Range: 8.9 - 10.3 mg/dL 9.4 9.5    Anion gap Latest Ref Range: 5 - _0 Calcium Ionized Latest Ref Range: 1.15 - 1.40 mmol/L   1.22 1.21  GFR, Est Non African American Latest Ref Range: >60 mL/min >60 >60    GFR, Est African American Latest Ref Range: >60 mL/min >60 >60    Procalcitonin Latest Units: ng/mL <0.10      Results for MASIE, BERMINGHAM (MRN [id number]) as of 12/16/2018 15:15  Ref. Range 12/10/2018 08:54 12/11/2018 09:41 12/12/2018 08:50 12/12/2018 08:51  WBC Latest Ref Range: 4.0 - 10.5 K/uL 5.9 5.5    RBC Latest Ref Range: 3.87 - 5.11 MIL/uL 4.49 4.58    Hemoglobin Latest Ref Range: 12.0 - 15.0 g/dL 11.2 (L) 11.1 (L) 10.9 (L) 10.9 (L)  HCT Latest Ref Range: 36.0 - 46.0 % 33.7 (L) 34.1 (L) 32.0 (L) 32.0 (L)  MCV Latest Ref Range: 80.0 - 100.0 fL 75.1 (L) 74.5 (L)    MCH Latest Ref Range: 26.0 - 34.0 pg 24.9 (L) 24.2 (L)    MCHC Latest Ref Range: 30.0 - 36.0 g/dL 33.2 32.6    RDW Latest Ref Range: 11.5 - 15.5 % 14.6 14.3    Platelets Latest Ref Range: 150 - 400 K/uL 331 396    nRBC Latest Ref Range: 0.0 - 0.2 % 0.0 0.0      Review of Systems  Constitution: Negative for decreased appetite, malaise/fatigue, weight gain and weight loss.  HENT: Negative for congestion.   Eyes: Negative for visual disturbance.  Cardiovascular: Positive for dyspnea on exertion. Negative for chest pain, leg swelling, palpitations and syncope.  Respiratory: Positive for cough and shortness of breath.   Endocrine: Negative for cold intolerance.   Hematologic/Lymphatic: Does not bruise/bleed easily.  Skin: Negative for itching and rash.  Musculoskeletal: Negative for myalgias.  Gastrointestinal: Negative for abdominal pain, nausea and vomiting.  Genitourinary: Negative for dysuria.  Neurological: Negative for dizziness and weakness.  Psychiatric/Behavioral: The patient is not nervous/anxious.   All other systems reviewed and are negative.       Vitals:   07/23/19 1545  BP: (!) 145/93  Pulse: 78  SpO2: 98%    Physical Exam  Constitutional: She is oriented to person, place, and time. She appears well-developed and well-nourished. No distress.  HENT:  Head: Normocephalic and atraumatic.  Malar rash   Eyes: Pupils are equal, round, and reactive to light. Conjunctivae are normal.  Neck: No JVD present.  Cardiovascular: Intact distal pulses.  Occasional extrasystoles are present. Tachycardia present.  Pulmonary/Chest: Effort normal and breath sounds normal. She has no wheezes. She has no rales.  Abdominal: Soft. Bowel sounds are normal. There is no rebound.  Musculoskeletal:        General: No edema.  Lymphadenopathy:    She has no cervical adenopathy.  Neurological: She is alert and oriented to person, place, and time. No cranial nerve deficit.  Skin: Skin is warm and dry.  Psychiatric: She has a normal mood and affect.  Nursing note and vitals reviewed.         Assessment & Recommendations:   43 year old female with long-standing SLE, hypertension, type 2  diabetes mellitus, CAD s/p pLAD PCI for critical stenosis (12/12/2018), paroxysmal Afib w/RVR, moderate aortic stenosis, stable pericardial effusion without tamponade or constriction (Cath 12/2018), interstitial lung disease, here for transition of care follow up.  Shortness of breath: Improved with treatment of pulmonary fibrosis.   CAD: S/p pLAD PCI.  Continue eliquis and plavix without Aspirin. Her symptoms are not anginal. HS trop mildly elevated at  baseline, without any rise and fall. Likely nonspecific supply demand mismatch.   Paroxysmal Afib: Continue metoprolol tartarate 50 mg bid, diltiazem to 240 mg daily.  CHA2DS2VASc score 3. On eliquis 5 mg bid  Hypertension: Fairly well controlled on current therapy.  Pericardial effusion: Historically stable without tamponade or constriction. Likely related to lupus. Benefits of pericardiocentesis do not outweigh the risks. Continue lupus management as per rheumatology. Conitnue colchicine 0.6 mg daily.   Moderate aortic stenosis: Aortic stenosis of trileaflet valve.Continue serial monitoring with echocardiogram. Repeat echocardiogram in 09/2019.  Type 2 DM: Continue current management per PCP, along with Jardiance.  Lupus: She has not been able to see them recently. Lupus management remains crucial.  F/u in 3 months after repeat echocardiogram.   [name], MD [facility]. [place] Pager: [phone] Office: [phone] If no answer Cell [phone]

## 2019-07-24 ENCOUNTER — Encounter: Payer: Self-pay | Admitting: Cardiology

## 2019-08-03 ENCOUNTER — Ambulatory Visit: Payer: 59 | Admitting: Pharmacist

## 2019-08-03 NOTE — Progress Notes (Deleted)
    S:     No chief complaint on file.  Patient arrives in good spirits.  Presents for diabetes evaluation, education, and management Patient was referred and last seen by Primary Care Provider on 06/05/19.   Patient reports Diabetes was diagnosed in 2015. Has been on insulin since 2019.  Family/Social History:  - FHX: DM, HTN - Never smoker - Occasionally uses alcohol  Insurance coverage/medication affordability: [employer]  Patient reports adherence with medications.  Current diabetes medications include: Basaglar 20 units daily, Novolog 10 units TID, Victoza 1.8 mg daily Current hypertension medications include: diltizaem 240 mg CD daily, furosemide 40 mg BID, losartam 50 mg daily Current hyperlipidemia medications include: rosuvastatin 20 mg daily  Patient denies hypoglycemic events.  Patient reported dietary habits:  - Denies adherence to a diabetic diet  Patient-reported exercise habits:  - Denies    Patient denies nocturia (nighttime urination).  Patient denies neuropathy (nerve pain). Patient denies visual changes. Patient reports self foot exams.    O:  POCT glucose: 148  Lab Results  Component Value Date   HGBA1C 13.2 (A) 06/05/2019   There were no vitals filed for this visit.  Lipid Panel     Component Value Date/Time   CHOL 154 06/05/2019 1436   TRIG 166 (H) 06/05/2019 1436   HDL 46 06/05/2019 1436   CHOLHDL 3.3 06/05/2019 1436   CHOLHDL 4.8 08/18/2008 1746   VLDL 20 08/18/2008 1746   LDLCALC 80 06/05/2019 1436   Home blood sugars averages  7 day avg: 126 14 day avg: 127   Clinical Atherosclerotic Cardiovascular Disease (ASCVD): CAD  A/P: Diabetes longstanding currently uncontrolled but home sugars reveal improvement since addition of Victoza and Novolog. Patient is able to verbalize appropriate hypoglycemia management plan. Patient is adherent with medication. Control is suboptimal due to dietary indiscretion and physical inactivity.  In addition, she takes daily prednisone for Lupus.  -Continued current regimen for now.  - Commended pt on BG control at home. -Extensively discussed pathophysiology of diabetes, recommended lifestyle interventions, dietary effects on blood sugar control -Counseled on s/sx of and management of hypoglycemia -Next A1C anticipated 08/2019.   ASCVD risk - secondary prevention in patient with diabetes + CAD. Last LDL is not <70. High intensity statin indicated.  -Continued rosuvastatin 20 mg.   HM: UTD on influenza, tetanus, and PNA vaccines.   Written patient instructions provided.  Total time in face to face counseling 15 minutes.   Follow up Pharmacist Clinic Visit in 1 month.  Benard Halsted, PharmD, [place] [phone]

## 2019-08-13 ENCOUNTER — Telehealth: Payer: Self-pay | Admitting: Acute Care

## 2019-08-13 ENCOUNTER — Ambulatory Visit (INDEPENDENT_AMBULATORY_CARE_PROVIDER_SITE_OTHER): Payer: 59

## 2019-08-13 ENCOUNTER — Other Ambulatory Visit: Payer: Self-pay

## 2019-08-13 VITALS — BP 134/95 | HR 104 | Wt 225.7 lb

## 2019-08-13 DIAGNOSIS — Z3042 Encounter for surveillance of injectable contraceptive: Secondary | ICD-10-CM

## 2019-08-13 MED ORDER — MEDROXYPROGESTERONE ACETATE 150 MG/ML IM SUSP
150.0000 mg | Freq: Once | INTRAMUSCULAR | Status: AC
  Administered 2019-08-13: 150 mg via INTRAMUSCULAR

## 2019-08-13 NOTE — Telephone Encounter (Signed)
I called and spoke with [name]. With [facility] and verified that the maintenance dose for patient per chart will be Esbriet 801mg  1 tab three times daily. He went ahead and placed an order for  a 90 day supply with 3 refills.   I will route this to pharmacy team and Dr. Chase Caller for [facility].

## 2019-08-13 NOTE — Progress Notes (Signed)
[name] here for Depo-Provera  Injection.  Injection administered without complication. Patient will return in 3 months for next injection.  Annabell Howells, RN 08/13/2019  8:41 AM

## 2019-08-17 NOTE — Telephone Encounter (Signed)
Called and spoke with pt stating that she was due for an appt with MR and asked her if I could make her an appt. Pt stated that was fine and has been scheduled for an appt with MR Wed. 1/6 at 12pm. Nothing further needed.

## 2019-08-19 ENCOUNTER — Encounter: Payer: Self-pay | Admitting: Internal Medicine

## 2019-08-19 ENCOUNTER — Other Ambulatory Visit: Payer: Self-pay

## 2019-08-19 ENCOUNTER — Encounter: Payer: 59 | Admitting: Internal Medicine

## 2019-08-19 ENCOUNTER — Ambulatory Visit (INDEPENDENT_AMBULATORY_CARE_PROVIDER_SITE_OTHER): Payer: 59 | Admitting: Internal Medicine

## 2019-08-19 VITALS — BP 128/70 | HR 99 | Ht 60.0 in | Wt 223.8 lb

## 2019-08-19 DIAGNOSIS — Z5181 Encounter for therapeutic drug level monitoring: Secondary | ICD-10-CM

## 2019-08-19 DIAGNOSIS — J8489 Other specified interstitial pulmonary diseases: Secondary | ICD-10-CM | POA: Diagnosis not present

## 2019-08-19 DIAGNOSIS — M359 Systemic involvement of connective tissue, unspecified: Secondary | ICD-10-CM | POA: Diagnosis not present

## 2019-08-19 DIAGNOSIS — M329 Systemic lupus erythematosus, unspecified: Secondary | ICD-10-CM

## 2019-08-19 DIAGNOSIS — J849 Interstitial pulmonary disease, unspecified: Secondary | ICD-10-CM

## 2019-08-19 LAB — HEPATIC FUNCTION PANEL
ALT: 10 U/L (ref 0–35)
AST: 16 U/L (ref 0–37)
Albumin: 3.8 g/dL (ref 3.5–5.2)
Alkaline Phosphatase: 61 U/L (ref 39–117)
Bilirubin, Direct: 0.1 mg/dL (ref 0.0–0.3)
Total Bilirubin: 0.2 mg/dL (ref 0.2–1.2)
Total Protein: 7.7 g/dL (ref 6.0–8.3)

## 2019-08-19 MED ORDER — HYDROXYCHLOROQUINE SULFATE 200 MG PO TABS: 200.0000 mg | ORAL_TABLET | Freq: Two times a day (BID) | ORAL | 4 refills | Status: DC

## 2019-08-19 NOTE — Research (Signed)
Title: Chronic Fibrosing Interstitial Lung Disease with Progressive Phenotype Prospective Outcomes (ILD-PRO) Registry   Protocol #: IPF-PRO-SUB, Clinical Trials # [id number], Sponsor: [employer]  Protocol Version Amendment 4 dated 12Sep2019  and confirmed YES current on 08/19/2019 Consent Version for today's visit date of 08/19/2019  is Version Amendment 4 (17 Jul 2018)  Objectives:  Marland Kitchen Describe current approaches to diagnosis and treatment of chronic fibrosing ILDs with progressive phenotype  . Describe the natural history of chronic fibrosing ILDs with progressive phenotype  . Assess quality of life from self-administered participant reported questionnaires for each disease group  . Describe participant interactions with the healthcare system, describe treatment practices across multiple institutions for each disease group  . Collect biological samples linked to well characterized chronic fibrosing ILDs with progressive phenotype to identify disease biomarkers  . Collect data and biological samples that will support future research studies.                                            Key Inclusion Criteria: Willing and able to provide informed consent  Age ? 30 years  Diagnosis of a non-IPF ILD of any duration, including, but not limited to Idiopathic Non-Specific Interstitial Pneumonia (INSIP), Unclassifiable Idiopathic Interstitial Pneumonias (IIPs), Interstitial Pneumonia with Autoimmune Features (IPAF), Autoimmune ILDs such as Rheumatoid Arthritis (RA-ILD) and Systemic Sclerosis (SSC-ILD), Chronic Hypersensitivity Pneumonitis (HP), Sarcoidosis or Exposure-related ILDs such as asbestosis.  Chronic fibrosing ILD defined by reticular abnormality with traction bronchiectasis with or without honeycombing confirmed by chest HRCT scan and/or lung biopsy.  Progressive phenotype as defined by fulfilling at least one of the criteria below of fibrotic changes (progression set  point) within the last 24 months regardless of treatment considered appropriate in individual ILDs:  . decline in FVC % predicted (% pred) based on >10% relative decline  . decline in FVC % pred based on ? 5 - <10% relative decline in FVC combined with worsening of respiratory symptoms as assessed by the site investigator  . decline in FVC % pred based on ? 5 - <10% relative decline in FVC combined with increasing extent of fibrotic changes on chest imaging (HRCT scan) as assessed by the site investigator  . decline in DLCO % pred based on ? 10% relative decline  . worsening of respiratory symptoms as well as increasing extent of fibrotic changes on chest imaging (HRCT scan) as assessed by the site investigator independent of FVC change.    Key Exclusion Criteria: Malignancy, treated or untreated, other than skin or early stage prostate cancer, within the past 5 years  Currently listed for lung transplantation at the time of enrollment  Currently enrolled in a clinical trial at the time of enrollment in this registry     Clinical Research Coordinator / Research RN note : This visit for Subject [name] with DOB: August 10, 1976 on 08/19/2019 for the above protocol is Visit/Encounter # Enrollment/Baseline and is for purpose of research. The consent for this encounter is under Protocol Version Amendment 4 (18 Jul 2019)and is currently IRB approved. Subject thanked for participation in research and contribution to science.  During this visiton01/01/2020,the subject met with me in the office to discuss the protocol ICF and sign. The study coordinator went over the entire ICF with the subject and the subject was given ample time to read and review the ICF. After review of  the ICF,all questions were answered to the subject's satisfaction.Dr. Chase Caller reviewed the ICF with the patient, explained the purpose of research, and asked if there were any additional questions. She stated that  thestudycoordinatorand doctorhave explained the study to her thoroughly and stated she had no additional questions. The subject,study coordinator,and PI signed the ICF and the subject was given a signed copy of the ICF for her records. After consent all study related procedures were conducted as per theabovestated protocol. For additional information on today's visit,please refer to subject's paper source binder.  Signed by  [place] [facility]  [place], [place] 12:18 PM 08/19/2019

## 2019-08-19 NOTE — Patient Instructions (Addendum)
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 -This was present in 2011 and worse in the scan in February 2020 and subsequently again worsen the scan in October 2020 - we started you on esbriet for this end nov 2020; glad you are tolerating it well -You enrolled in ILD-pro registry study today  Plan  -Continue pirfenidone as per schedule -We will call with liver function test results from 08/19/2019  -Ensure you apply sunscreen at all times when going out  Snoring Morbid obesity with BMI of 45.0-49.9, adult [facility])  -Refer Dr. Elsworth Soho or Dr. Leretha Pol in our office for undiagnosed sleep apnea -Ultimately will have to figure out a weight loss plan  Lupus ([place])  -I think her lupus is active -Glad you are feeling better without prednisone because of the side effects -Too bad that you have run out of her Plaquenil -Too bad you are not able to reestablish with your rheumatologist Dr. Amil Amen because of insurance issues  Plan  -Refer Dr. Bo Merino for lupus - start plaquenil from oour office - will send in scropt     Pericardial effusion  -Is described as moderate on the CT scan in October 2020. Same as feb 2020. Per Dr. Virgina Jock this is stable and he is monitoring it.   Pulmonary hypertension -seen on right heart catheterization May 2020 but associated with high wedge pressure  --This was seen in the heart catheterization in May 2020 and was at that time because of excess fluid in the body -At some point will recommend Dr. Virgina Jock to repeat this procedure to see if lupus is causing this problem directly as opposed to fluid -   I have d/w him 06/11/2019   Follow-up  -4 weeks telephone visit to ensure esbriet tolerance is going well ; Dr Chase Caller

## 2019-08-19 NOTE — Progress Notes (Signed)
OV 06/11/2019  Subjective:  Patient ID: [name], female , DOB: 10-Sep-1975 , age 44 y.o. , MRN: [id number] , ADDRESS: [address] [place] [address]   06/11/2019 -   Chief Complaint  Patient presents with  . Follow-up    Patient reports that she's doing much better and her cough is alot better than it was.      HPI SLYVIA Pittman 44 y.o. -is being evaluated to the interstitial lung disease clinic.  She is lupus related interstitial lung disease.  Details are all listed below.  She is unaware of the presence of interstitial lung disease.  I personally visualized the scan.  Documented to have ILD in 2011 worse in February 2020 the traction bronchiectasis is worse now in October 2020 later scan.  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  Severity is listed below.  Is associated with arthralgia.  Also since May 2019 she has chronic cough that is very severe.  It gets better with prednisone.  Sometimes clear sputum but sometimes dry.  It is worse when she lies down.  It affects her voice she clears her throat and she does feel a tickle in the back of the throat.  Other symptoms include arthralgia muscle pain rash from lupus and depression. Dyspnea is progressive this year.   Past Medical History :  -   #Lupus -according to her lupus was diagnosed in 1999.  This was at the time of childbirth.  Since then through May 2019 symptoms were mainly malar rash and other skin lesions and arthralgia.  She was on chronic long-term Plaquenil.  She would only be on occasional prednisone.   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  This includes pericardial effusion,  coronary artery disease,  atrial fibrillation.  She says since then she is required prednisone which she took constantly for 3 months through August 2019.  After that she has had intermittent course of prednisone ranging from several days to few months.  In total 7 times.  She has had admissions to the hospital because of this and is listing for heart failure, coronary artery disease and also ILD flareup.  She had a right heart cath and left heart cath.  This was done in May 2020 by Dr. Virgina Jock.  She is status post coronary stent and is on antiplatelet therapy with Plavix and Eliquis.  Her wedge pressures were slightly high and so was a pulmonary artery pressures.  Her longstanding pericardial effusion is being monitored clinically.  There is no evidence of tamponade as of May 2020 right heart catheterization.  She states that she is been on and off prednisone.  In spring 2020 she was started on Imuran by Dr. Melissa Noon team.  Most recently in September 2020 [late September] she tried to wean herself off prednisone but then her shortness of breath cough wheezing and skin lesions and arthralgia all get worse and so she is back on prednisone.  This most recent prednisone was initiated by our office nurse practitioner.  She feels Imuran is not controlling  her lupus as well as the prednisone    #History positive for CHF not otherwise specified coronary artery disease and pericardial effusion and atrial fibrillation  #  she says she has sleep apnea but is not on CPAP.  Details not known.  She has diabetes.  She has DJD  ROS: Positive for fatigue arthralgia discoloration of her fingers in cold weather [Raynaud's] malar rash and other rash snoring heartburn but no oral ulcers.  No nausea vomiting or diarrhea.   FAMILY HISTORY of LUNG DISEASE: She has a cousin with asthma and grandmother with sarcoidosis.  Another  cousin with an autoimmune disease.   EXPOSURE HISTORY: She does not smoke cigarettes.  No electronic cigarette use.  No marijuana use no cocaine use no IV drug abuse.   HOME and HOBBY DETAILS : [place] the last 2 years.  Age of the home is unknown.  In the home there is no history of any organic antigen exposure.  For example she does not live in a damp environment.  There is no mildew in the bathrooms.  There is no humidifier.  She does not use a CPAP.  No nebulizer machine use.  No steam Jacuzzi.  No steam iron.  There is no fountain in the house.  No pet birds no hamsters of pet gerbils.  Does not use feather pillow or duvet.  There is no mold in the [facility] duct.  Does not play musical instruments.  Does not do any gardening.   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  She is also been on colchicine she says.  She is been on prednisone on and off since 2019.  Also Dilantin according to history.  IMPRESSION: HRCT 1. Spectrum of findings compatible with basilar predominant fibrotic interstitial lung disease without frank honeycombing. Slight worsening of traction bronchiectasis since 09/24/2018 high-resolution chest CT. 2. Stable mild cardiomegaly and moderate pericardial effusion. Findings are categorized as probable UIP per consensus guidelines: Diagnosis of Idiopathic Pulmonary Fibrosis: An Official ATS/ERS/JRS/ALAT Clinical Practice Guideline. [place], Iss 5, [phone], Apr 13 2017. 3. Three-vessel coronary atherosclerosis. 4. Stable mild mediastinal lymphadenopathy, compatible with benign reactive etiology.  Aortic Atherosclerosis (ICD10-I70.0).   Electronically Signed   By: Ilona Sorrel M.D.   On: 06/05/2019 16:47  ROS - per HPI    HEART CATH MAy  [id number]  LV end diastolic pressure is normal.   RA: 10 mmHg RV: 46/7 mmHg. RVEDP 8 mmHg PA: 48/22 mmHg. Mean PA 33 mmHg PW: 17 mmHg LV 155/7 mmHg. LVEDP 10 mmHg <10 mmHg LV-Ao pullback gradient CO 7.8 L/min. CI 4 L/min/m2  Simultaneous RV-LV pressure tracings do not show interdependence to suggest constriction physiology.  LM: Wall calcium with no significant stenosis LAD: Ostial-proximal 90% stenosis. Normal diagonal branches. Successful PTCA and stent placement pLAD Synergy DES 3.5X16 mm DES Post dilatation with 4.0 X 8 mm [place] balloon. 0% residual stenosis Ramus: Normal LCx: Normal RCA: Normal  Recommendation: Aspirin/plavix/Eliquis for 1 month. Then stop Aspirin, continue eliquis and plavix for 6 months.  Patients medical complicities remain complex. She still needs aggressive management for her lupus and interstitial lung disease. Should she need lung biopsy in the near future, plavix could potentially be stopped after 3 months use ie in 03/2019.  Manish J  Virgina Jock, MD [facility] Pager: [phone] Office: [phone] If no answer Cell [phone]   has a past medical history of Abnormal Pap smear of cervix, Allergy, Anemia, Atrial fibrillation ([place]), CHF (congestive heart failure) ([place]), Depression, Enlarged heart, Fibroid, Gestational diabetes, Heart murmur, Hypertension, Infection, and Lupus ([place]) (1999).   reports that she has never smoked. She has never used smokeless tobacco.  OV 08/19/2019  Subjective:  Patient ID: [name], female , DOB: [birth year] , age 17 y.o. , MRN: [id number] , ADDRESS: [address] [place] [address]   08/19/2019 -   Chief Complaint  Patient presents with  . Follow-up    Pt states she has been doing better since last visit. Pt does have an occ cough which has gotten better. Pt does become SOB with activities.   Follow-up progressive interstitial lung disease in the setting of connective tissue  disease; systemic lupus erythematosus -started pirfenidone July 09, 2019 Has associated  -Morbid obesity with history of snoring: High pretest probability for sleep apnea -Significant systemic lupus erythematosus: Follows with Dr. Amil Amen but having insurance issues -Pericardial effusion and pulmonary hypertension seen on heart catheterization in spring 2020: Follows with Dr. Virgina Jock on expectant follow-up.  HPI DIOR DOMINIK 44 y.o. -this follow-up visit is to ensure that she is tolerating her pirfenidone quite well.  She started this on July 09 2019 for progressive ILD.  She tells me that at this point in time she is completely off prednisone.  Without the prednisone she is actually feeling better.  Her sugars are better controlled.  She does not have that much fatigue.  She tells me the pirfenidone is overall helping her [this medicines are supposed to make her feel better but is more preventative antifibrotic].  She feels that even her fatigue and dyspnea are better because of the pirfenidone [I do not know about any anti-inflammatory effects against autoimmune disease with this drug but it can function as an anti-inflammatory].  Her dyspnea is better.  She did have some GI side effects early on with the drug but currently none.  She is having liver function test today.  Today she enrolled in the ILD-pro registry study for progressive non-IPF ILD.  She wants to see a different rheumatologist because of insurance issues she is having difficulty establishing again with Dr. Amil Amen.  She has not yet seen a sleep doctor for his suspected sleep apnea.  She has seen cardiology Dr. Virgina Jock for pericardial effusion and pulmonary hypertension.  She is on expectant follow-up.  I reviewed his notes.  It appears she also had a ER visit because of multifocal atrial tachycardia symptom score and walking desaturation test are documented below.        SYMPTOM SCALE - ILD 06/11/2019  08/19/2019    O2 use RA ra  Shortness of Breath 0 -> 5 scale with 5 being worst (score 6 If unable to do)   At rest 0 0  Simple tasks - showers, clothes change, eating, shaving 2 3  Household (dishes, doing bed, laundry) 2 3  Shopping 2 3  Walking level at own pace 2 2  Walking keeping up with others of same age 1 3  Walking up Stairs 2 4  Walking up Hill 2 4  Total (40 - 48) Dyspnea Score 14 32  How bad is your cough? 1 2  How bad is your fatigue 2 3   Simple office walk 185 feet x  3 laps goal  with forehead probe 06/11/2019  08/19/2019 esbeit  O2 used RA RA  Number laps completed 3 3  Comments about pace Nl pace Slow pace due to arthralgia  Resting Pulse Ox/HR 98% and 104/min 100% and 102/min  Final Pulse Ox/HR 96% and 127/min 98% and 118/min  Desaturated </= 88% no no  Desaturated <= 3% points no no  Got Tachycardic >/= 90/min yes yes  Symptoms at end of test Very mild dyspnea Mild dyspnea  Miscellaneous comments *feeling better       Results for SUPRINA, MANDEVILLE (MRN [id number]) as of 06/11/2019 09:17  Ref. Range 05/12/2019 08:52  FVC-Pre Latest Units: L 1.65  FVC-%Pred-Pre Latest Units: % 63  FEV1-Pre Latest Units: L 1.48  FEV1-%Pred-Pre Latest Units: % 69  Pre FEV1/FVC ratio Latest Units: % 90   Results for CONNIE, LASATER (MRN [id number]) as of 06/11/2019 09:17  Ref. Range 05/12/2019 08:52  TLC Latest Units: L 2.72  TLC % pred Latest Units: % 61   Results for VICCI, REDER (MRN [id number]) as of 06/11/2019 09:17  Ref. Range 05/12/2019 08:52  DLCO unc Latest Units: ml/min/mmHg 10.98  DLCO unc % pred Latest Units: % 58     ROS - per HPI     has a past medical history of Abnormal Pap smear of cervix, Allergy, Anemia, Atrial fibrillation ([place]), CHF (congestive heart failure) ([place]), Depression, Enlarged heart, Fibroid, Gestational diabetes, Heart murmur, Hypertension, Infection, and Lupus ([place]) (1999).   reports that she has never smoked. She has never used smokeless  tobacco.  Past Surgical History:  Procedure Laterality Date  . A-FLUTTER ABLATION N/A 06/10/2018   Procedure: A-FLUTTER ABLATION;  Surgeon: Evans Lance, MD;  Location: [facility];  Service: Cardiovascular;  Laterality: N/A;  . CARDIAC CATHETERIZATION    . CORONARY STENT INTERVENTION N/A 12/12/2018   Procedure: CORONARY STENT INTERVENTION;  Surgeon: [name], MD;  Location: [facility];  Service: Cardiovascular;  Laterality: N/A;  lad   . RIGHT/LEFT HEART CATH AND CORONARY ANGIOGRAPHY N/A 03/14/2018   Procedure: RIGHT/LEFT HEART CATH AND CORONARY ANGIOGRAPHY;  Surgeon: [name], MD;  Location: [place] [facility] [facility];  Service: Cardiovascular;  Laterality: N/A;  . RIGHT/LEFT HEART CATH AND CORONARY ANGIOGRAPHY N/A 12/12/2018   Procedure: RIGHT/LEFT HEART CATH AND CORONARY ANGIOGRAPHY;  Surgeon: [name], MD;  Location: [facility];  Service: Cardiovascular;  Laterality: N/A;  . THERAPEUTIC ABORTION      Allergies  Allergen Reactions  . Celery Oil Shortness Of Breath, Itching, Rash and Other (See Comments)    Bumps on tongue, also  . Peanut-Containing Drug Products Anaphylaxis, Shortness Of Breath, Itching, Rash and Other (See Comments)    Bumps on tongue, also  . Septra [Bactrim] Hives    Immunization History  Administered Date(s) Administered  . Influenza,inj,Quad PF,6+ Mos 05/25/2013, 05/14/2018, 05/13/2019  . Pneumococcal Polysaccharide-23 05/14/2018  . Tdap 07/09/2018    Family History  Problem Relation Age of Onset  . Cancer Maternal Grandfather        lung  . Diabetes Mother   . Hypertension Mother   . Hypertension Maternal Grandmother   . Diabetes Father   . Hearing loss Neg Hx      Current Outpatient Medications:  .  acetaminophen (TYLENOL) 500 MG tablet, Take 1,000 mg by mouth every 6 (six) hours as needed for headache (pain)., Disp: , Rfl:  .  albuterol (PROVENTIL HFA;VENTOLIN HFA) 108 (90 Base) MCG/ACT  inhaler, Inhale 2 puffs into the lungs every 6 (six) hours as needed for wheezing or shortness of breath., Disp: 1 Inhaler, Rfl: 2 .  apixaban (ELIQUIS) 5 MG TABS tablet, Take 1 tablet (5 mg total) by mouth 2 (two) times daily., Disp: 60 tablet, Rfl: 2 .  azaTHIOprine (IMURAN) 50 MG tablet, Take 50 mg by mouth 2 (two) times daily., Disp: , Rfl:  .  benzonatate (TESSALON) 200 MG capsule, Take 1 capsule (200 mg total) by mouth 3 (three) times daily as needed for cough., Disp: 30 capsule, Rfl: 0 .  Blood Glucose Monitoring Suppl (ONETOUCH VERIO) w/Device KIT, 1 each by Does not apply route 3 (three) times daily., Disp: 1 kit, Rfl: 0 .  clopidogrel (PLAVIX) 75 MG tablet, Take 1 tablet (75 mg total) by mouth daily with breakfast., Disp: 60 tablet, Rfl: 2 .  colchicine 0.6 MG tablet, Take 1 tablet (0.6 mg total) by mouth daily., Disp: 60 tablet, Rfl: 1 .  cyclobenzaprine (FLEXERIL) 10 MG tablet, Take 1 tablet (10 mg total) by mouth 2 (two) times daily as needed., Disp: 10 tablet, Rfl: 0 .  diltiazem (CARDIZEM CD) 240 MG 24 hr capsule, Take 1 capsule (240 mg total) by mouth daily., Disp: 90 capsule, Rfl: 2 .  ESBRIET 801 MG TABS, Take 1 tablet by mouth 3 (three) times daily. , Disp: , Rfl:  .  furosemide (LASIX) 40 MG tablet, TAKE 1 TABLET (40 MG TOTAL) BY MOUTH 2 (TWO) TIMES DAILY., Disp: 60 tablet, Rfl: 2 .  glucose blood (ONETOUCH VERIO) test strip, Use as instructed, Disp: 100 each, Rfl: 12 .  hydroxychloroquine (PLAQUENIL) 200 MG tablet, Take 1 tablet (200 mg total) by mouth 2 (two) times daily., Disp: 60 tablet, Rfl: 4 .  insulin aspart (NOVOLOG FLEXPEN) 100 UNIT/ML FlexPen, Inject 10 Units into the skin 3 (three) times daily with meals., Disp: 30 mL, Rfl: 1 .  Insulin Glargine (BASAGLAR KWIKPEN) 100 UNIT/ML SOPN, Inject 0.2 mLs (20 Units total) into the skin at bedtime., Disp: 40 mL, Rfl: 3 .  Insulin Pen Needle (TRUEPLUS PEN NEEDLES) 32G X 4 MM MISC, Use as directed to inject insulin 5x daily, Disp:  200 each, Rfl: 3 .  Lancet Devices (ONE TOUCH DELICA LANCING DEV) MISC, 1 each by Does not apply route 3 (three) times daily., Disp: 1 each, Rfl: 1 .  liraglutide (VICTOZA) 18 MG/3ML SOPN, SubQ:  Inject 0.6 mg once daily into the skin. Week 2: increase to 1.2 mg once daily; week 3: increase to 1.8 mg once daily, Disp: 9 mL, Rfl: 2 .  medroxyPROGESTERone (DEPO-PROVERA) 150 MG/ML injection, Inject 150 mg into the muscle every 3 (three) months., Disp: , Rfl:  .  methocarbamol (ROBAXIN) 500 MG tablet, Take 2 tablets (1,000 mg total) by mouth every 8 (eight) hours as needed for muscle spasms., Disp: 90 tablet, Rfl: 0 .  metoprolol tartrate (LOPRESSOR) 50 MG tablet, Take 1 tablet (50 mg total) by mouth 2 (two) times daily., Disp: 180 tablet, Rfl: 1 .  OneTouch Delica Lancets 16X MISC, 1 each by Does not apply route 3 (three) times daily., Disp: 100 each, Rfl: 12 .  pantoprazole (PROTONIX) 40 MG tablet, Take 1 tablet (40 mg total) by mouth daily., Disp: 90 tablet, Rfl: 1 .  Phenyleph-CPM-DM-Aspirin (ALKA-SELTZER PLUS COLD & COUGH PO), Take 2 tablets by mouth every 4 (four) hours as needed (cough/congestion)., Disp: , Rfl:  .  rosuvastatin (CRESTOR) 20 MG tablet, Take 1 tablet (20 mg total) by  mouth daily at 6 PM., Disp: 30 tablet, Rfl: 4 .  traMADol (ULTRAM) 50 MG tablet, Take 1 tablet by mouth as needed., Disp: , Rfl:  .  cetirizine (ZYRTEC) 10 MG tablet, Take 1 tablet (10 mg total) by mouth daily., Disp: 90 tablet, Rfl: 1 .  losartan (COZAAR) 50 MG tablet, Take 1 tablet (50 mg total) by mouth daily., Disp: 90 tablet, Rfl: 3      Objective:   Vitals:   08/19/19 1230  BP: 128/70  Pulse: 99  SpO2: 100%  Weight: 223 lb 12.8 oz (101.5 kg)  Height: 5' (1.524 m)    Estimated body mass index is 43.71 kg/m as calculated from the following:   Height as of this encounter: 5' (1.524 m).   Weight as of this encounter: 223 lb 12.8 oz (101.5 kg).  @WEIGHTCHANGE @  [employer]   08/19/19 1230   Weight: 223 lb 12.8 oz (101.5 kg)     Physical Exam  General Appearance:    Alert, cooperative, no distress, appears stated age - yes , Deconditioned looking - no , OBESE  - yes, Sitting on Wheelchair -  no  Head:    Normocephalic, without obvious abnormality, atraumatic  Eyes:    PERRL, conjunctiva/corneas clear,  Ears:    Normal TM's and external ear canals, both ears  Nose:   Nares normal, septum midline, mucosa normal, no drainage    or sinus tenderness. OXYGEN ON  - no . Patient is @ ra   Throat:   Lips, mucosa, and tongue normal; teeth and gums normal. Cyanosis on lips - no  Neck:   Supple, symmetrical, trachea midline, no adenopathy;    thyroid:  no enlargement/tenderness/nodules; no carotid   bruit or JVD  Back:     Symmetric, no curvature, ROM normal, no CVA tenderness  Lungs:     Distress - no , Wheeze no, Barrell Chest - no, Purse lip breathing - no, Crackles - no   Chest Wall:    No tenderness or deformity.    Heart:    Regular rate and rhythm, S1 and S2 normal, no rub   or gallop, Murmur - no  Breast Exam:    NOT DONE  Abdomen:     Soft, non-tender, bowel sounds active all four quadrants,    no masses, no organomegaly. Visceral obesity - yes  Genitalia:   NOT DONE  Rectal:   NOT DONE  Extremities:   Extremities - normal, Has Cane - no, Clubbing - no, Edema - no  Pulses:   2+ and symmetric all extremities  Skin:   Stigmata of Connective Tissue Disease - no  Lymph nodes:   Cervical, supraclavicular, and axillary nodes normal  Psychiatric:  Neurologic:   Pleasant - yes, Anxious - no, Flat affect - no  CAm-ICU - neg, Alert and Oriented x 3 - yes, Moves all 4s - yes, Speech - normal, Cognition - intact           Assessment:       ICD-10-CM   1. Interstitial lung disease due to connective tissue disease ([place])  J84.89 Hepatic function panel   M35.9   2. Encounter for therapeutic drug monitoring  Z51.81 Hepatic function panel  3. 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 -  This was present in 2011 and worse in the scan in February 2020 and subsequently again worsen the scan in October 2020 - we started you on esbriet for this end nov 2020; glad you are tolerating it well -You enrolled in ILD-pro registry study today  Plan  -Continue pirfenidone as per schedule -We will call with liver function test results from 08/19/2019  -Ensure you apply sunscreen at all times when going out  Snoring Morbid obesity with BMI of 45.0-49.9, adult [facility])  -Refer Dr. Elsworth Soho or Dr. Leretha Pol in our office for undiagnosed sleep apnea -Ultimately will have to figure out a weight loss plan  Lupus ([place])  -I think her lupus is active -Glad you are feeling better without prednisone because of the side effects -Too bad that you have run out of her Plaquenil -Too bad you are not able to reestablish with your rheumatologist Dr. Amil Amen because of insurance issues  Plan  -Refer Dr. Bo Merino for lupus - start plaquenil from oour office - will send in scropt     Pericardial effusion  -Is described as moderate on the CT scan in October 2020. Same as feb 2020. Per Dr. Virgina Jock this is stable and he is monitoring it.   Pulmonary hypertension -seen on right heart catheterization May 2020 but associated with high wedge pressure  --This was seen in the heart catheterization in May 2020 and was at that time because of excess fluid in the body -At some point will recommend Dr. Virgina Jock to repeat this procedure to see if lupus is causing this problem directly as opposed to fluid -   I have d/w him 06/11/2019   Follow-up  -4 weeks telephone visit to ensure esbriet tolerance is going well ; Dr Chase Caller   ( Level 03: 20-29 min visit spent in n-site visit  in total care time  and counseling or/and coordination of care by this undersigned MD - Dr Brand Males. This includes one or more of the following all delivered on this same day 08/19/2019: pre-charting, chart review, note writing, documentation discussion of test results, diagnostic or treatment recommendations, prognosis, risks and benefits of management options, instructions, education, compliance or risk-factor reduction. It excludes time spent by the [place] or office staff in the care of the patient)     SIGNATURE    Dr. Brand Males, M.D., F.C.C.P,  Pulmonary and Critical Care Medicine Staff Physician, [facility] Director - Interstitial Lung Disease  Program  Pulmonary [facility] at [place], [place], [address]  Pager: [phone], If no answer or between  15:00h - 7:00h: call [phone] Telephone: [phone]  1:22 PM 08/19/2019

## 2019-08-20 NOTE — Progress Notes (Signed)
LFT normal on esbriet

## 2019-08-21 ENCOUNTER — Inpatient Hospital Stay: Admission: RE | Admit: 2019-08-21 | Payer: 59 | Source: Ambulatory Visit

## 2019-08-21 NOTE — Progress Notes (Signed)
I have reviewed this chart and agree with the RN/CMA assessment and management.    Bellah Alia C Haely Leyland, MD, FACOG Attending Physician, Faculty Practice [facility]  

## 2019-08-26 ENCOUNTER — Telehealth: Payer: Self-pay | Admitting: Internal Medicine

## 2019-08-26 ENCOUNTER — Other Ambulatory Visit: Payer: Self-pay | Admitting: Nurse Practitioner

## 2019-08-26 DIAGNOSIS — E1165 Type 2 diabetes mellitus with hyperglycemia: Secondary | ICD-10-CM

## 2019-08-26 NOTE — Telephone Encounter (Signed)
Attempted to call pt to see if she has seen a rheumatologist prior due to MR referring her to Dr. Estanislado Pandy who is requesting previous rheumatology records. Unable to reach pt. Left message for pt to return call so we can obtain this information from her.

## 2019-08-27 NOTE — Telephone Encounter (Signed)
Called and spoke with pt to see when last saw Dr. Amil Amen and pt stated she last saw him in September 2020. Stated to pt that we would call Dr. Melissa Noon office to get those records as Dr. Arlean Hopping office is needing those records.  Called Dr. Melissa Noon office at [phone] and spoke with Otila Kluver stating to her that we were needing to have pt's records sent to [place]. Otila Kluver verbalized understanding and stated she would fax pt's records. Will be on a lookout for those records.

## 2019-08-27 NOTE — Telephone Encounter (Signed)
[facility], can you please send this info to Dr. Arlean Hopping office from the referrals tab and see if they want [place] to try to get ahold of the records from Dr. Melissa Noon office?

## 2019-08-27 NOTE — Telephone Encounter (Signed)
She sees Dr Amil Amen at [facility] rhematology  Pt called back [phone]

## 2019-08-27 NOTE — Telephone Encounter (Signed)
Yes, Dr. Arlean Hopping office is requesting the records.

## 2019-08-28 ENCOUNTER — Institutional Professional Consult (permissible substitution): Payer: 59 | Admitting: Pulmonary Disease

## 2019-09-07 ENCOUNTER — Encounter: Payer: Self-pay | Admitting: Nurse Practitioner

## 2019-09-07 ENCOUNTER — Ambulatory Visit: Payer: 59 | Attending: Nurse Practitioner | Admitting: Nurse Practitioner

## 2019-09-07 ENCOUNTER — Other Ambulatory Visit: Payer: Self-pay

## 2019-09-07 DIAGNOSIS — D509 Iron deficiency anemia, unspecified: Secondary | ICD-10-CM

## 2019-09-07 DIAGNOSIS — I10 Essential (primary) hypertension: Secondary | ICD-10-CM

## 2019-09-07 DIAGNOSIS — E1165 Type 2 diabetes mellitus with hyperglycemia: Secondary | ICD-10-CM | POA: Diagnosis not present

## 2019-09-07 DIAGNOSIS — L02411 Cutaneous abscess of right axilla: Secondary | ICD-10-CM | POA: Diagnosis not present

## 2019-09-07 DIAGNOSIS — R05 Cough: Secondary | ICD-10-CM

## 2019-09-07 DIAGNOSIS — R053 Chronic cough: Secondary | ICD-10-CM

## 2019-09-07 MED ORDER — CETIRIZINE HCL 10 MG PO TABS: 10.0000 mg | ORAL_TABLET | Freq: Every day | ORAL | 1 refills | Status: DC

## 2019-09-07 MED ORDER — DOXYCYCLINE HYCLATE 100 MG PO TABS: 100.0000 mg | ORAL_TABLET | Freq: Two times a day (BID) | ORAL | 0 refills | Status: DC

## 2019-09-07 NOTE — Addendum Note (Signed)
Addended bySteffanie Dunn on: 09/07/2019 11:34 AM   Modules accepted: Orders

## 2019-09-07 NOTE — Progress Notes (Signed)
Virtual Visit via Telephone Note Due to national recommendations of social distancing due to [place] 19, telehealth visit is felt to be most appropriate for this patient at this time.  I discussed the limitations, risks, security and privacy concerns of performing an evaluation and management service by telephone and the availability of in person appointments. I also discussed with the patient that there may be a patient responsible charge related to this service. The patient expressed understanding and agreed to proceed.    I connected with [name] on 09/07/19  at   8:30 AM EST  EDT by telephone and verified that I am speaking with the correct person using two identifiers.   Consent I discussed the limitations, risks, security and privacy concerns of performing an evaluation and management service by telephone and the availability of in person appointments. I also discussed with the patient that there may be a patient responsible charge related to this service. The patient expressed understanding and agreed to proceed.   Location of Patient: Private Residence   Location of Provider: [place] participating in Telemedicine visit: Geryl Rankins FNP-BC [place]    History of Present Illness: Telemedicine visit for: F/U She has referred to a LUPUS specialist. Waiting to schedule.   Abscess: Patient presents for evaluation of a cutaneous abscess. Lesion is located in the right axilla. Onset was 8 days ago. Symptoms have gradually worsened. Abscess has associated symptoms of spontaneous drainage, pain. Patient does have previous history of cutaneous abscesses. Patient does have diabetes.  DM TYPE 2 Not well controlled. Highs Fasting 140s. Stopped taking prednisone since October. Lost 11lbs. Currently taking novolog 10 units TID, basglar 20 units at bedtime, victoza 1.8 mg daily. Taking ARB and STATIN. Denies any hypo or  hyperglycemic symptoms. Blood pressure is well controlled with Losartan 50 mg daily.  Lab Results  Component Value Date   HGBA1C 13.2 (A) 06/05/2019   Wt Readings from Last 3 Encounters:  08/19/19 223 lb 12.8 oz (101.5 kg)  08/13/19 225 lb 11.2 oz (102.4 kg)  07/23/19 228 lb 11.2 oz (103.7 kg)    Essential Hypertension Well controlled.  She en, DM (dorses medication compliance taking Cardizem 240 mg daily, losartan 50 mg daily and furosemide 40 mg daily BID.  BP Readings from Last 3 Encounters:  08/19/19 128/70  08/13/19 (!) 134/95  07/23/19 (!) 145/93   LDL not at goal of <70. Taking crestor 20 mg daily as prescribed. Denies any statin intolerance.  Lab Results  Component Value Date   [place] 80 06/05/2019   Past Medical History:  Diagnosis Date  . Abnormal Pap smear of cervix    colpo, HPV  . Allergy   . Anemia   . Atrial fibrillation ([place])   . CHF (congestive heart failure) ([place])   . Depression    after losses  . Enlarged heart    managed by cardiology  . Fibroid   . Gestational diabetes   . Heart murmur   . Hypertension   . Infection    UTI  . Lupus ([place]) 1999    Past Surgical History:  Procedure Laterality Date  . A-FLUTTER ABLATION N/A 06/10/2018   Procedure: A-FLUTTER ABLATION;  Surgeon: Evans Lance, MD;  Location: [facility];  Service: Cardiovascular;  Laterality: N/A;  . CARDIAC CATHETERIZATION    . CORONARY STENT INTERVENTION N/A 12/12/2018   Procedure: CORONARY STENT INTERVENTION;  Surgeon: Virgina Jock,  Reynold Bowen, MD;  Location: [facility];  Service: Cardiovascular;  Laterality: N/A;  lad   . RIGHT/LEFT HEART CATH AND CORONARY ANGIOGRAPHY N/A 03/14/2018   Procedure: RIGHT/LEFT HEART CATH AND CORONARY ANGIOGRAPHY;  Surgeon: [name], MD;  Location: [facility];  Service: Cardiovascular;  Laterality: N/A;  . RIGHT/LEFT HEART CATH AND CORONARY ANGIOGRAPHY N/A 12/12/2018   Procedure: RIGHT/LEFT HEART CATH AND CORONARY  ANGIOGRAPHY;  Surgeon: [name], MD;  Location: [facility];  Service: Cardiovascular;  Laterality: N/A;  . THERAPEUTIC ABORTION      Family History  Problem Relation Age of Onset  . Cancer Maternal Grandfather        lung  . Diabetes Mother   . Hypertension Mother   . Hypertension Maternal Grandmother   . Diabetes Father   . Hearing loss Neg Hx     Social History   Socioeconomic History  . Marital status: Married    Spouse name: Not on file  . Number of children: 2  . Years of education: Not on file  . Highest education level: Not on file  Occupational History  . Not on file  Tobacco Use  . Smoking status: Never Smoker  . Smokeless tobacco: Never Used  Substance and Sexual Activity  . Alcohol use: Yes    Comment: ocasionally   . Drug use: No  . Sexual activity: Yes    Birth control/protection: Injection  Other Topics Concern  . Not on file  Social History Narrative  . Not on file   Social Determinants of Health   Financial Resource Strain:   . Difficulty of Paying Living Expenses: Not on file  Food Insecurity:   . Worried About Charity fundraiser in the Last Year: Not on file  . Ran Out of Food in the Last Year: Not on file  Transportation Needs:   . Lack of Transportation (Medical): Not on file  . Lack of Transportation (Non-Medical): Not on file  Physical Activity:   . Days of Exercise per Week: Not on file  . Minutes of Exercise per Session: Not on file  Stress:   . Feeling of Stress : Not on file  Social Connections:   . Frequency of Communication with Friends and Family: Not on file  . Frequency of Social Gatherings with Friends and Family: Not on file  . Attends Religious Services: Not on file  . Active Member of Clubs or Organizations: Not on file  . Attends Archivist Meetings: Not on file  . Marital Status: Not on file     Observations/Objective: Awake, alert and oriented x 3   Review of Systems  Constitutional:  Negative for fever, malaise/fatigue and weight loss.  HENT: Negative.  Negative for nosebleeds.   Eyes: Negative.  Negative for blurred vision, double vision and photophobia.  Respiratory: Negative.  Negative for cough (chronic, controlled), shortness of breath and wheezing.   Cardiovascular: Negative.  Negative for chest pain, palpitations and leg swelling.  Gastrointestinal: Negative.  Negative for heartburn, nausea and vomiting.  Musculoskeletal: Negative.  Negative for myalgias.  Neurological: Negative.  Negative for dizziness, focal weakness, seizures and headaches.  Psychiatric/Behavioral: Negative.  Negative for suicidal ideas.    Assessment and Plan: [name] was seen today for follow-up.  Diagnoses and all orders for this visit:  Cutaneous abscess of right axilla -     US BREAST COMPLETE UNI RIGHT INC AXILLA; Future -     doxycycline (  VIBRA-TABS) 100 MG tablet; Take 1 tablet (100 mg total) by mouth 2 (two) times daily. -     CBC; Future  Cough, persistent -     cetirizine (ZYRTEC) 10 MG tablet; Take 1 tablet (10 mg total) by mouth daily.  Type 2 diabetes mellitus with hyperglycemia, without long-term current use of insulin (HCC) -     Hemoglobin A1c; Future Continue blood sugar control as discussed in office today, low carbohydrate diet, and regular physical exercise as tolerated, 150 minutes per week (30 min each day, 5 days per week, or 50 min 3 days per week). Keep blood sugar logs with fasting goal of 90-130 mg/dl, post prandial (after you eat) less than 180.  For Hypoglycemia: BS <60 and Hyperglycemia BS >400; contact the clinic ASAP. Annual eye exams and foot exams are recommended.  Essential hypertension -     CMP14+EGFR; Future Continue all antihypertensives as prescribed.  Remember to bring in your blood pressure log with you for your follow up appointment.  DASH/Mediterranean Diets are healthier choices for HTN.    Microcytic anemia -     CBC; Future      Follow Up Instructions Return in about 3 months (around 12/06/2019).     I discussed the assessment and treatment plan with the patient. The patient was provided an opportunity to ask questions and all were answered. The patient agreed with the plan and demonstrated an understanding of the instructions.   The patient was advised to call back or seek an in-person evaluation if the symptoms worsen or if the condition fails to improve as anticipated.  I provided 18 minutes of non-face-to-face time during this encounter including median intraservice time, reviewing previous notes, labs, imaging, medications and explaining diagnosis and management.  Gildardo Pounds, FNP-BC

## 2019-09-08 LAB — CBC
Hematocrit: 34.3 % (ref 34.0–46.6)
Hemoglobin: 11.2 g/dL (ref 11.1–15.9)
MCH: 25.6 pg — ABNORMAL LOW (ref 26.6–33.0)
MCHC: 32.7 g/dL (ref 31.5–35.7)
MCV: 79 fL (ref 79–97)
Platelets: 337 10*3/uL (ref 150–450)
RBC: 4.37 x10E6/uL (ref 3.77–5.28)
RDW: 15 % (ref 11.7–15.4)
WBC: 5.8 10*3/uL (ref 3.4–10.8)

## 2019-09-08 LAB — CMP14+EGFR
ALT: 12 IU/L (ref 0–32)
AST: 15 IU/L (ref 0–40)
Albumin/Globulin Ratio: 1 — ABNORMAL LOW (ref 1.2–2.2)
Albumin: 3.6 g/dL — ABNORMAL LOW (ref 3.8–4.8)
Alkaline Phosphatase: 90 IU/L (ref 39–117)
BUN/Creatinine Ratio: 13 (ref 9–23)
BUN: 10 mg/dL (ref 6–24)
Bilirubin Total: <0.2 mg/dL (ref 0.0–1.2)
CO2: 25 mmol/L (ref 20–29)
Calcium: 9.5 mg/dL (ref 8.7–10.2)
Chloride: 104 mmol/L (ref 96–106)
Creatinine, Ser: 0.76 mg/dL (ref 0.57–1.00)
GFR calc Af Amer: 110 mL/min/{1.73_m2} (ref >59)
GFR calc non Af Amer: 96 mL/min/{1.73_m2} (ref >59)
Globulin, Total: 3.7 g/dL (ref 1.5–4.5)
Glucose: 99 mg/dL (ref 65–99)
Potassium: 3.3 mmol/L — ABNORMAL LOW (ref 3.5–5.2)
Sodium: 142 mmol/L (ref 134–144)
Total Protein: 7.3 g/dL (ref 6.0–8.5)

## 2019-09-08 LAB — HEMOGLOBIN A1C
Est. average glucose Bld gHb Est-mCnc: 148 mg/dL
Hgb A1c MFr Bld: 6.8 % — ABNORMAL HIGH (ref 4.8–5.6)

## 2019-09-10 ENCOUNTER — Other Ambulatory Visit: Payer: Self-pay | Admitting: Cardiology

## 2019-09-11 NOTE — Telephone Encounter (Signed)
Did receive some records from Dr. Melissa Noon office of the last [place] as well as most recent labwork. I have placed this on Holly's desk as she was the one who had been helping with the referral.  Pt will be coming to see MR for a follow up next week and if we have to, we can have pt fill out a release of records form so we can get all her records from Dr. Melissa Noon office sent to Dr. Estanislado Pandy so pt can be seen by her for an appt.  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

## 2019-09-11 NOTE — Telephone Encounter (Signed)
I still have not seen records come for pt from Dr. Melissa Noon office.  Called Dr. Melissa Noon office again and spoke with Jenny Reichmann who transferred me to medical records and spoke with Anderson Malta and requested records to be sent over to office. Anderson Malta said she would fax it to our office in my attn. Will be on the lookout for this.

## 2019-09-13 ENCOUNTER — Other Ambulatory Visit: Payer: Self-pay | Admitting: Nurse Practitioner

## 2019-09-13 DIAGNOSIS — I10 Essential (primary) hypertension: Secondary | ICD-10-CM

## 2019-09-14 ENCOUNTER — Other Ambulatory Visit: Payer: Self-pay | Admitting: Nurse Practitioner

## 2019-09-16 ENCOUNTER — Ambulatory Visit (INDEPENDENT_AMBULATORY_CARE_PROVIDER_SITE_OTHER): Payer: 59 | Admitting: Internal Medicine

## 2019-09-16 ENCOUNTER — Other Ambulatory Visit: Payer: Self-pay

## 2019-09-16 ENCOUNTER — Telehealth: Payer: Self-pay | Admitting: Internal Medicine

## 2019-09-16 DIAGNOSIS — R0683 Snoring: Secondary | ICD-10-CM

## 2019-09-16 DIAGNOSIS — Z6841 Body Mass Index (BMI) 40.0 and over, adult: Secondary | ICD-10-CM

## 2019-09-16 DIAGNOSIS — M329 Systemic lupus erythematosus, unspecified: Secondary | ICD-10-CM | POA: Diagnosis not present

## 2019-09-16 DIAGNOSIS — J8489 Other specified interstitial pulmonary diseases: Secondary | ICD-10-CM

## 2019-09-16 DIAGNOSIS — M359 Systemic involvement of connective tissue, unspecified: Secondary | ICD-10-CM

## 2019-09-16 DIAGNOSIS — I3139 Other pericardial effusion (noninflammatory): Secondary | ICD-10-CM

## 2019-09-16 DIAGNOSIS — I272 Pulmonary hypertension, unspecified: Secondary | ICD-10-CM

## 2019-09-16 DIAGNOSIS — Z5181 Encounter for therapeutic drug level monitoring: Secondary | ICD-10-CM

## 2019-09-16 DIAGNOSIS — I313 Pericardial effusion (noninflammatory): Secondary | ICD-10-CM

## 2019-09-16 NOTE — Telephone Encounter (Signed)
Per Dr. Arlean Hopping office:  We appreciate your referral. Each referral is reviewed by our providers to ensure that the patient will receive the best medical care possible. Upon review of this referral Dr. Estanislado Pandy has declined it. Recommends tertiary care.   Do you want me to see about sending this to a different rheumatologist?

## 2019-09-16 NOTE — Patient Instructions (Addendum)
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 -This was present in 2011 and worse in the scan in February 2020 and subsequently again worsen the scan in October 2020 - we started you on esbriet for this end nov 2020;    mild nausea 09/16/2019 visit due to inadequate spacing between morning and afternoon dosing  - LFT normal end Jan 2021  Plan  -Continue pirfenidone as per schedule - but space dosing by 5-6 hours -Repeat liver function test results early to mid march 2021 -Ensure you apply sunscreen at all times when going out -   Snoring Morbid obesity with BMI of 45.0-49.9, adult [facility])  -Refer Dr. Elsworth Soho or Dr. Leretha Pol in our office for undiagnosed sleep apnea -Ultimately will have to figure out a weight loss plan  Lupus ([place])  -Glad you are feeling better without prednisone because of the side effects - Glad plaquenil and immuran is helping -Too bad you are not able to reestablish with your rheumatologist Dr. Amil Amen because of insurance issues  Plan  -Refer Dr. Bo Merino for lupus - continue plaquenil from oour office til you get a rheumatologist - will send in scropt - continue immuran     Pericardial effusion  -Is described as moderate on the CT scan in October 2020. Same as feb 2020. Per Dr. Virgina Jock this is stable and he is monitoring it.   Pulmonary hypertension -seen on right heart catheterization May 2020 but associated with high wedge pressure  --This was seen in the heart catheterization in May 2020 and was at that time because of excess fluid in the body -At some point will recommend Dr. Virgina Jock to repeat this procedure to see if lupus is causing this problem directly as opposed to fluid -   I have d/w him 06/11/2019   Follow-up  -4 weeks telephone visit with aPP or Dr Chase Caller to ensure esbriet tolerance is going well

## 2019-09-16 NOTE — Telephone Encounter (Signed)
MR, please advise in regards to response from [facility] after we referred pt to Dr. Estanislado Pandy and she denied the referral.

## 2019-09-16 NOTE — Telephone Encounter (Signed)
Pt had a phone visit today 2/3 with MR for a follow up. Per MR, pt needs to be referred to either Dr. Elsworth Soho or Dr. Ander Slade for a sleep consult due to snoring. Pt also needs to be scheduled a 4 week follow up with either MR or an APP to ensure she is tolerating Esbriet well.  Attempted to call pt to see if I could schedule an appt with sleep doc and also a follow up televisit with either MR or APP in 4 weeks but unable to reach pt. Left message for pt to return call.    Also per MR, pt is still needing an appt scheduled with Dr. Estanislado Pandy rheumatologist to follow her for her lupus. Last message was Dr. Arlean Hopping office was needing records from pt's previous rheumatologist and we did receive the records from Dr. Melissa Noon office which were faxed over.  Holly, checking to see if you have an update for [place] in regards to the referral to Dr. Estanislado Pandy.

## 2019-09-16 NOTE — Progress Notes (Signed)
OV 06/11/2019  Subjective:  Patient ID: [name], female , DOB: 07-06-1976 , age 44 y.o. , MRN: [id number] , ADDRESS: [address] [place] [address]   06/11/2019 -   Chief Complaint  Patient presents with  . Follow-up    Patient reports that she's doing much better and her cough is alot better than it was.      HPI [name] 44 y.o. -is being evaluated to the interstitial lung disease clinic.  She is lupus related interstitial lung disease.  Details are all listed below.  She is unaware of the presence of interstitial lung disease.  I personally visualized the scan.  Documented to have ILD in 2011 worse in February 2020 the traction bronchiectasis is worse now in October 2020 later scan.  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  Severity is listed below.  Is associated with arthralgia.  Also since May 2019 she has chronic cough that is very severe.  It gets better with prednisone.  Sometimes clear sputum but sometimes dry.  It is worse when she lies down.  It affects her voice she clears her throat and she does feel a tickle in the back of the throat.  Other symptoms include arthralgia muscle pain rash from lupus and depression. Dyspnea is progressive this year.   Past Medical History :  -   #Lupus -according to her lupus was diagnosed in 1999.  This was at the time of childbirth.  Since then through May 2019 symptoms were mainly malar rash and other skin lesions and arthralgia.  She was on chronic long-term Plaquenil.  She would only be on occasional prednisone.   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  This includes pericardial effusion,  coronary artery disease,  atrial fibrillation.  She says since then she is required prednisone which she took constantly for 3 months through August 2019.  After that she has had intermittent course of prednisone ranging from several days to few months.  In total 7 times.  She has had admissions to the hospital because of this and is listing for heart failure, coronary artery disease and also ILD flareup.  She had a right heart cath and left heart cath.  This was done in May 2020 by Dr. Virgina Jock.  She is status post coronary stent and is on antiplatelet therapy with Plavix and Eliquis.  Her wedge pressures were slightly high and so was a pulmonary artery pressures.  Her longstanding pericardial effusion is being monitored clinically.  There is no evidence of tamponade as of May 2020 right heart catheterization.  She states that she is been on and off prednisone.  In spring 2020 she was started on Imuran by Dr. Melissa Noon team.  Most recently in September 2020 [late September] she tried to wean herself off prednisone but then her shortness of breath cough wheezing and skin lesions and arthralgia all get worse and so she is back on prednisone.  This most recent prednisone was initiated by our office nurse practitioner.  She feels Imuran is not controlling  her lupus as well as the prednisone    #History positive for CHF not otherwise specified coronary artery disease and pericardial effusion and atrial fibrillation  #  she says she has sleep apnea but is not on CPAP.  Details not known.  She has diabetes.  She has DJD  ROS: Positive for fatigue arthralgia discoloration of her fingers in cold weather [Raynaud's] malar rash and other rash snoring heartburn but no oral ulcers.  No nausea vomiting or diarrhea.   FAMILY HISTORY of LUNG DISEASE: She has a cousin with asthma and grandmother with sarcoidosis.  Another  cousin with an autoimmune disease.   EXPOSURE HISTORY: She does not smoke cigarettes.  No electronic cigarette use.  No marijuana use no cocaine use no IV drug abuse.   HOME and HOBBY DETAILS : [place] the last 2 years.  Age of the home is unknown.  In the home there is no history of any organic antigen exposure.  For example she does not live in a damp environment.  There is no mildew in the bathrooms.  There is no humidifier.  She does not use a CPAP.  No nebulizer machine use.  No steam Jacuzzi.  No steam iron.  There is no fountain in the house.  No pet birds no hamsters of pet gerbils.  Does not use feather pillow or duvet.  There is no mold in the [facility] duct.  Does not play musical instruments.  Does not do any gardening.   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  She is also been on colchicine she says.  She is been on prednisone on and off since 2019.  Also Dilantin according to history.  IMPRESSION: HRCT 1. Spectrum of findings compatible with basilar predominant fibrotic interstitial lung disease without frank honeycombing. Slight worsening of traction bronchiectasis since 09/24/2018 high-resolution chest CT. 2. Stable mild cardiomegaly and moderate pericardial effusion. Findings are categorized as probable UIP per consensus guidelines: Diagnosis of Idiopathic Pulmonary Fibrosis: An Official ATS/ERS/JRS/ALAT Clinical Practice Guideline. [place], Iss 5, [phone], Apr 13 2017. 3. Three-vessel coronary atherosclerosis. 4. Stable mild mediastinal lymphadenopathy, compatible with benign reactive etiology.  Aortic Atherosclerosis (ICD10-I70.0).   Electronically Signed   By: Ilona Sorrel M.D.   On: 06/05/2019 16:47  ROS - per HPI    HEART CATH MAy  [id number]  LV end diastolic pressure is normal.   RA: 10 mmHg RV: 46/7 mmHg. RVEDP 8 mmHg PA: 48/22 mmHg. Mean PA 33 mmHg PW: 17 mmHg LV 155/7 mmHg. LVEDP 10 mmHg <10 mmHg LV-Ao pullback gradient CO 7.8 L/min. CI 4 L/min/m2  Simultaneous RV-LV pressure tracings do not show interdependence to suggest constriction physiology.  LM: Wall calcium with no significant stenosis LAD: Ostial-proximal 90% stenosis. Normal diagonal branches. Successful PTCA and stent placement pLAD Synergy DES 3.5X16 mm DES Post dilatation with 4.0 X 8 mm Morrow balloon. 0% residual stenosis Ramus: Normal LCx: Normal RCA: Normal  Recommendation: Aspirin/plavix/Eliquis for 1 month. Then stop Aspirin, continue eliquis and plavix for 6 months.  Patients medical complicities remain complex. She still needs aggressive management for her lupus and interstitial lung disease. Should she need lung biopsy in the near future, plavix could potentially be stopped after 3 months use ie in 03/2019.  Manish J  Virgina Jock, MD [facility]. [place] Pager: [phone] Office: [phone] If no answer Cell [phone]   has a past medical history of Abnormal Pap smear of cervix, Allergy, Anemia, Atrial fibrillation ([place]), CHF (congestive heart failure) ([place]), Depression, Enlarged heart, Fibroid, Gestational diabetes, Heart murmur, Hypertension, Infection, and Lupus ([place]) (1999).   reports that she has never smoked. She has never used smokeless tobacco.  OV 08/19/2019  Subjective:  Patient ID: [name], female , DOB: [birth year] , age 39 y.o. , MRN: [id number] , ADDRESS: [address] [place] [address]   08/19/2019 -   Chief Complaint  Patient presents with  . Follow-up    Pt states she has been doing better since last visit. Pt does have an occ cough which has gotten better. Pt does become SOB with activities.   Follow-up progressive interstitial lung disease in the setting of connective tissue  disease; systemic lupus erythematosus -started pirfenidone July 09, 2019 Has associated  -Morbid obesity with history of snoring: High pretest probability for sleep apnea -Significant systemic lupus erythematosus: Follows with Dr. Amil Amen but having insurance issues -Pericardial effusion and pulmonary hypertension seen on heart catheterization in spring 2020: Follows with Dr. Virgina Jock on expectant follow-up.  HPI [name] 44 y.o. -this follow-up visit is to ensure that she is tolerating her pirfenidone quite well.  She started this on July 09 2019 for progressive ILD.  She tells me that at this point in time she is completely off prednisone.  Without the prednisone she is actually feeling better.  Her sugars are better controlled.  She does not have that much fatigue.  She tells me the pirfenidone is overall helping her [this medicines are supposed to make her feel better but is more preventative antifibrotic].  She feels that even her fatigue and dyspnea are better because of the pirfenidone [I do not know about any anti-inflammatory effects against autoimmune disease with this drug but it can function as an anti-inflammatory].  Her dyspnea is better.  She did have some GI side effects early on with the drug but currently none.  She is having liver function test today.  Today she enrolled in the ILD-pro registry study for progressive non-IPF ILD.  She wants to see a different rheumatologist because of insurance issues she is having difficulty establishing again with Dr. Amil Amen.  She has not yet seen a sleep doctor for his suspected sleep apnea.  She has seen cardiology Dr. Virgina Jock for pericardial effusion and pulmonary hypertension.  She is on expectant follow-up.  I reviewed his notes.  It appears she also had a ER visit because of multifocal atrial tachycardia symptom score and walking desaturation test are documented below.    OV 09/16/2019  Subjective:  Patient ID: [name], female , DOB: 1975-09-30 , age 40 y.o. , MRN: [id number] , ADDRESS: [address] [place] [address] [place] [address]  Follow-up progressive interstitial lung disease in the setting of connective tissue disease; SLE   -started pirfenidone July 09, 2019  - enrolled ILD-Pro registry study Jan 2021 Has associated  -Morbid obesity with history of snoring: High pretest probability for sleep apnea  -Significant systemic lupus erythematosus:   - Follows with Dr. Amil Amen but having insurance issues  - Referred to Dr Estanislado Pandy - Mary Sella 2021.   - On Pllaquenil since 2000  - On immuran since early 2020  - s.p prednisone 2018 - 2020 intermittnet only - significant side effects +  -Pericardial effusion and  pulmonary hypertension seen on heart catheterization in spring 2020:   - Follows with Dr. Virgina Jock on expectant follow-up.  09/16/2019 -telephone visit Chief Complaint  Patient presents with  . televisit    Called and spoke with pt who states she has been doing okay since last visit. Pt states she has had some issues with nausea.     HPI [name] 44 y.o. -on this telephone visit patient identified with 2 person identifier.  Risks, benefits and limitations of telephone visit explained.  She verbalized and agreed to proceed.  This visit main focus is to see how she is doing with the pirfenidone.  She tells me for the last few weeks she has had nausea in the afternoon.  I found out that she is taking her morning dose between 9 and 10 AM and the afternoon dose between noon and 1 PM.  This is very inadequate spacing.  She did not realize she has a space the dosing at least 5 or 6 hours apart.  She was reeducated on this.  Followed up with her on her sleep issues.  She does not have a follow-up sleep consult appointment.  I also referred her to  Dr D rheumatologist.  However she is here to establish with her.  She is having insurance issues with Dr. Amil Amen.  She has upcoming appointment with  Dr. Virgina Jock who cardiologist.  In terms of her lupus she is not on prednisone but continuing Plaquenil and Imuran.  She is really keen to reestablish with a rheumatologist.     SYMPTOM SCALE - ILD 06/11/2019  08/19/2019   O2 use RA ra  Shortness of Breath 0 -> 5 scale with 5 being worst (score 6 If unable to do)   At rest 0 0  Simple tasks - showers, clothes change, eating, shaving 2 3  Household (dishes, doing bed, laundry) 2 3  Shopping 2 3  Walking level at own pace 2 2  Walking keeping up with others of same age 35 3  Walking up Stairs 2 4  Walking up Hill 2 4  Total (40 - 48) Dyspnea Score 14 32  How bad is your cough? 1 2  How bad is your fatigue 2 3   Simple office walk 185 feet x  3 laps goal with forehead probe 06/11/2019  08/19/2019 esbeit  O2 used RA RA  Number laps completed 3 3  Comments about pace Nl pace Slow pace due to arthralgia  Resting Pulse Ox/HR 98% and 104/min 100% and 102/min  Final Pulse Ox/HR 96% and 127/min 98% and 118/min  Desaturated </= 88% no no  Desaturated <= 3% points no no  Got Tachycardic >/= 90/min yes yes  Symptoms at end of test Very mild dyspnea Mild dyspnea  Miscellaneous comments *feeling better       Results for [name], [name] (MRN [id number]) as of 06/11/2019 09:17  Ref. Range 05/12/2019 08:52  FVC-Pre Latest Units: L 1.65  FVC-%Pred-Pre Latest Units: % 63  FEV1-Pre Latest Units: L 1.48  FEV1-%Pred-Pre Latest Units: % 69  Pre FEV1/FVC ratio Latest Units: % 90   Results for [name], [name] (MRN [id number]) as of 06/11/2019 09:17  Ref. Range 05/12/2019 08:52  TLC Latest Units: L 2.72  TLC % pred Latest Units: % 61   Results for [name], [name] (MRN [id number]) as of 06/11/2019 09:17  Ref. Range 05/12/2019 08:52  DLCO unc Latest Units: ml/min/mmHg 10.98  DLCO unc % pred Latest Units: %  57    ROS - per HPI     has a past medical history of Abnormal Pap smear of cervix, Allergy, Anemia, Atrial fibrillation ([place]),  CHF (congestive heart failure) ([place]), Depression, Enlarged heart, Fibroid, Gestational diabetes, Heart murmur, Hypertension, Infection, and Lupus ([place]) (1999).   reports that she has never smoked. She has never used smokeless tobacco.  Past Surgical History:  Procedure Laterality Date  . A-FLUTTER ABLATION N/A 06/10/2018   Procedure: A-FLUTTER ABLATION;  Surgeon: Evans Lance, MD;  Location: [place] [facility];  Service: Cardiovascular;  Laterality: N/A;  . CARDIAC CATHETERIZATION    . CORONARY STENT INTERVENTION N/A 12/12/2018   Procedure: CORONARY STENT INTERVENTION;  Surgeon: [name], MD;  Location: [place] [facility];  Service: Cardiovascular;  Laterality: N/A;  lad   . RIGHT/LEFT HEART CATH AND CORONARY ANGIOGRAPHY N/A 03/14/2018   Procedure: RIGHT/LEFT HEART CATH AND CORONARY ANGIOGRAPHY;  Surgeon: [name], MD;  Location: [place] [facility];  Service: Cardiovascular;  Laterality: N/A;  . RIGHT/LEFT HEART CATH AND CORONARY ANGIOGRAPHY N/A 12/12/2018   Procedure: RIGHT/LEFT HEART CATH AND CORONARY ANGIOGRAPHY;  Surgeon: [name], MD;  Location: [place] [facility];  Service: Cardiovascular;  Laterality: N/A;  . THERAPEUTIC ABORTION      Allergies  Allergen Reactions  . Celery Oil Shortness Of Breath, Itching, Rash and Other (See Comments)    Bumps on tongue, also  . Peanut-Containing Drug Products Anaphylaxis, Shortness Of Breath, Itching, Rash and Other (See Comments)    Bumps on tongue, also  . Septra [Bactrim] Hives    Immunization History  Administered Date(s) Administered  . Influenza,inj,Quad PF,6+ Mos 05/25/2013, 05/14/2018, 05/13/2019  . Pneumococcal Polysaccharide-23 05/14/2018  . Tdap 07/09/2018    Family History  Problem Relation Age of Onset  . Cancer Maternal Grandfather        lung  . Diabetes Mother   . Hypertension Mother   . Hypertension Maternal Grandmother   . Diabetes Father   . Hearing loss Neg Hx      Current  Outpatient Medications:  .  acetaminophen (TYLENOL) 500 MG tablet, Take 1,000 mg by mouth every 6 (six) hours as needed for headache (pain)., Disp: , Rfl:  .  albuterol (PROVENTIL HFA;VENTOLIN HFA) 108 (90 Base) MCG/ACT inhaler, Inhale 2 puffs into the lungs every 6 (six) hours as needed for wheezing or shortness of breath., Disp: 1 Inhaler, Rfl: 2 .  azaTHIOprine (IMURAN) 50 MG tablet, Take 50 mg by mouth 2 (two) times daily., Disp: , Rfl:  .  Blood Glucose Monitoring Suppl (ONETOUCH VERIO) w/Device KIT, 1 each by Does not apply route 3 (three) times daily., Disp: 1 kit, Rfl: 0 .  cetirizine (ZYRTEC) 10 MG tablet, Take 1 tablet (10 mg total) by mouth daily., Disp: 90 tablet, Rfl: 1 .  clopidogrel (PLAVIX) 75 MG tablet, Take 1 tablet (75 mg total) by mouth daily with breakfast., Disp: 60 tablet, Rfl: 2 .  colchicine 0.6 MG tablet, Take 1 tablet (0.6 mg total) by mouth daily., Disp: 60 tablet, Rfl: 1 .  cyclobenzaprine (FLEXERIL) 10 MG tablet, Take 1 tablet (10 mg total) by mouth 2 (two) times daily as needed., Disp: 10 tablet, Rfl: 0 .  diltiazem (CARDIZEM CD) 240 MG 24 hr capsule, Take 1 capsule (240 mg total) by mouth daily., Disp: 90 capsule, Rfl: 2 .  doxycycline (VIBRA-TABS) 100 MG tablet, Take 1 tablet (100 mg total) by mouth 2 (two) times daily., Disp: 20  tablet, Rfl: 0 .  ELIQUIS 5 MG TABS tablet, TAKE 1 TABLET TWICE A DAY, Disp: 180 tablet, Rfl: 2 .  ESBRIET 801 MG TABS, Take 1 tablet by mouth 3 (three) times daily. , Disp: , Rfl:  .  furosemide (LASIX) 40 MG tablet, TAKE 1 TABLET BY MOUTH TWICE A DAY, Disp: 180 tablet, Rfl: 0 .  glucose blood (ONETOUCH VERIO) test strip, Use as instructed, Disp: 100 each, Rfl: 12 .  hydroxychloroquine (PLAQUENIL) 200 MG tablet, Take 1 tablet (200 mg total) by mouth 2 (two) times daily., Disp: 60 tablet, Rfl: 4 .  insulin aspart (NOVOLOG) 100 UNIT/ML injection, Inject into the skin 3 (three) times daily before meals. Inject 10 units in skin 3 times daily.,  Disp: , Rfl:  .  Insulin Glargine (BASAGLAR KWIKPEN) 100 UNIT/ML SOPN, Inject 100 Units into the skin at bedtime. Inject 0.15m (20units) into skin at bedtime., Disp: , Rfl:  .  Insulin Pen Needle (TRUEPLUS PEN NEEDLES) 32G X 4 MM MISC, Use as directed to inject insulin 5x daily, Disp: 200 each, Rfl: 3 .  Lancet Devices (ONE TOUCH DELICA LANCING DEV) MISC, 1 each by Does not apply route 3 (three) times daily., Disp: 1 each, Rfl: 1 .  liraglutide (VICTOZA) 18 MG/3ML SOPN, INJECT 0.6MG ONCE DAILY INTO THE SKIN, INCREASE TO 1.2MG ONCE DAILY ON WEEK 2, AND 1.8MG ON WEEK 3, Disp: 3 pen, Rfl: 2 .  medroxyPROGESTERone (DEPO-PROVERA) 150 MG/ML injection, Inject 150 mg into the muscle every 3 (three) months., Disp: , Rfl:  .  methocarbamol (ROBAXIN) 500 MG tablet, Take 2 tablets (1,000 mg total) by mouth every 8 (eight) hours as needed for muscle spasms., Disp: 90 tablet, Rfl: 0 .  metoprolol tartrate (LOPRESSOR) 50 MG tablet, Take 1 tablet (50 mg total) by mouth 2 (two) times daily., Disp: 180 tablet, Rfl: 1 .  OneTouch Delica Lancets 317PMISC, 1 each by Does not apply route 3 (three) times daily., Disp: 100 each, Rfl: 12 .  Phenyleph-CPM-DM-Aspirin (ALKA-SELTZER PLUS COLD & COUGH PO), Take 2 tablets by mouth every 4 (four) hours as needed (cough/congestion)., Disp: , Rfl:  .  rosuvastatin (CRESTOR) 20 MG tablet, Take 1 tablet (20 mg total) by mouth daily at 6 PM., Disp: 30 tablet, Rfl: 4 .  traMADol (ULTRAM) 50 MG tablet, Take 1 tablet by mouth as needed., Disp: , Rfl:  .  insulin aspart (NOVOLOG FLEXPEN) 100 UNIT/ML FlexPen, Inject 10 Units into the skin 3 (three) times daily with meals., Disp: 30 mL, Rfl: 1 .  Insulin Glargine (BASAGLAR KWIKPEN) 100 UNIT/ML SOPN, Inject 0.2 mLs (20 Units total) into the skin at bedtime., Disp: 40 mL, Rfl: 3 .  losartan (COZAAR) 50 MG tablet, Take 1 tablet (50 mg total) by mouth daily., Disp: 90 tablet, Rfl: 3 .  pantoprazole (PROTONIX) 40 MG tablet, Take 1 tablet (40 mg  total) by mouth daily., Disp: 90 tablet, Rfl: 1      Objective:   There were no vitals filed for this visit.  Estimated body mass index is 43.71 kg/m as calculated from the following:   Height as of 08/19/19: 5' (1.524 m).   Weight as of 08/19/19: 223 lb 12.8 oz (101.5 kg).  @WEIGHTCHANGE @  There were no vitals filed for this visit.   Physical Exam Sounded normal on the phone without any respiratory distress.  Was cheerful.      Assessment:       ICD-10-CM   1. Interstitial lung disease due  to connective tissue disease ([place])  J84.89    M35.9   2. Encounter for therapeutic drug monitoring  Z51.81   3. Lupus ([place])  M32.9   4. Snoring  R06.83   5. Morbid obesity with BMI of 45.0-49.9, adult ([place])  E66.01    Z68.42   6. Pericardial effusion  I31.3   7. 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 -This was present in 2011 and worse in the scan in February 2020 and subsequently again worsen the scan in October 2020 - we started you on esbriet for this end nov 2020;    mild nausea 09/16/2019 visit due to inadequate spacing between morning and afternoon dosing  - LFT normal end Jan 2021  Plan  -Continue pirfenidone as per schedule - but space dosing by 5-6 hours -Repeat liver function test results early to mid march 2021 -Ensure you apply sunscreen at all times when going out -   Snoring Morbid obesity with BMI of 45.0-49.9, adult [facility])  -Refer Dr. Elsworth Soho or Dr. Leretha Pol in our office for undiagnosed sleep apnea -Ultimately will have to figure out a weight loss plan  Lupus ([place])  -Glad you are feeling better without prednisone because of the side effects - Glad plaquenil and immuran is helping -Too bad you are not able to reestablish with your rheumatologist Dr. Amil Amen because of insurance  issues  Plan  -Refer Dr. Bo Merino for lupus - continue plaquenil from oour office til you get a rheumatologist - will send in scropt - continue immuran     Pericardial effusion  -Is described as moderate on the CT scan in October 2020. Same as feb 2020. Per Dr. Virgina Jock this is stable and he is monitoring it.   Pulmonary hypertension -seen on right heart catheterization May 2020 but associated with high wedge pressure  --This was seen in the heart catheterization in May 2020 and was at that time because of excess fluid in the body -At some point will recommend Dr. Virgina Jock to repeat this procedure to see if lupus is causing this problem directly as opposed to fluid -   I have d/w him 06/11/2019   Follow-up  -4 weeks telephone visit with aPP or Dr Chase Caller to ensure esbriet tolerance is going well   ( Level 03: Esbt 20-29 min visit spent in visit type: telephone visit in total care time and counseling or/and coordination of care by this undersigned MD - Dr Brand Males. This includes one or more of the following all delivered on this same day 09/16/2019: pre-charting, chart review, note writing, documentation discussion of test results, diagnostic or treatment recommendations, prognosis, risks and benefits of management options, instructions, education, compliance or risk-factor reduction. It excludes time spent by the [place] or office staff in the care of the patient. Actual time was 20 min)   SIGNATURE    Dr. Brand Males, M.D., F.C.C.P,  Pulmonary and Critical Care Medicine Staff Physician, [place] Director - Interstitial Lung Disease  Program  Pulmonary [place] at [place], [place], [address]  Pager: [phone], If no answer or between  15:00h - 7:00h: call [phone] Telephone: [phone]  10:13 AM 09/16/2019

## 2019-09-17 NOTE — Telephone Encounter (Signed)
Try [employer]. If not, then we can consider [facility]

## 2019-09-18 NOTE — Telephone Encounter (Signed)
PCCs please advise if this referral can be sent to [facility].  Thanks!

## 2019-09-21 ENCOUNTER — Other Ambulatory Visit: Payer: Medicaid Other

## 2019-09-21 NOTE — Telephone Encounter (Signed)
Referral sent to [facility].

## 2019-09-28 ENCOUNTER — Ambulatory Visit: Payer: Medicaid Other | Admitting: Cardiology

## 2019-10-13 ENCOUNTER — Other Ambulatory Visit: Payer: 59

## 2019-10-16 ENCOUNTER — Ambulatory Visit: Payer: 59

## 2019-10-16 ENCOUNTER — Other Ambulatory Visit: Payer: Self-pay

## 2019-10-16 DIAGNOSIS — I313 Pericardial effusion (noninflammatory): Secondary | ICD-10-CM

## 2019-10-16 DIAGNOSIS — R0602 Shortness of breath: Secondary | ICD-10-CM

## 2019-10-16 DIAGNOSIS — I350 Nonrheumatic aortic (valve) stenosis: Secondary | ICD-10-CM

## 2019-10-16 DIAGNOSIS — I3139 Other pericardial effusion (noninflammatory): Secondary | ICD-10-CM

## 2019-10-16 DIAGNOSIS — M329 Systemic lupus erythematosus, unspecified: Secondary | ICD-10-CM

## 2019-10-21 ENCOUNTER — Encounter: Payer: Self-pay | Admitting: *Deleted

## 2019-10-21 ENCOUNTER — Telehealth: Payer: Self-pay | Admitting: Internal Medicine

## 2019-10-21 ENCOUNTER — Other Ambulatory Visit: Payer: Self-pay

## 2019-10-21 ENCOUNTER — Ambulatory Visit (INDEPENDENT_AMBULATORY_CARE_PROVIDER_SITE_OTHER): Payer: 59 | Admitting: Internal Medicine

## 2019-10-21 ENCOUNTER — Encounter: Payer: Self-pay | Admitting: Internal Medicine

## 2019-10-21 DIAGNOSIS — Z6841 Body Mass Index (BMI) 40.0 and over, adult: Secondary | ICD-10-CM

## 2019-10-21 DIAGNOSIS — M329 Systemic lupus erythematosus, unspecified: Secondary | ICD-10-CM

## 2019-10-21 DIAGNOSIS — I313 Pericardial effusion (noninflammatory): Secondary | ICD-10-CM

## 2019-10-21 DIAGNOSIS — J8489 Other specified interstitial pulmonary diseases: Secondary | ICD-10-CM

## 2019-10-21 DIAGNOSIS — I3139 Other pericardial effusion (noninflammatory): Secondary | ICD-10-CM

## 2019-10-21 DIAGNOSIS — M359 Systemic involvement of connective tissue, unspecified: Secondary | ICD-10-CM

## 2019-10-21 DIAGNOSIS — R0683 Snoring: Secondary | ICD-10-CM

## 2019-10-21 DIAGNOSIS — Z5181 Encounter for therapeutic drug level monitoring: Secondary | ICD-10-CM | POA: Diagnosis not present

## 2019-10-21 NOTE — Progress Notes (Signed)
Subjective:    Patient ID: [name], female    DOB: 05-Jul-1976, 44 y.o.   MRN: [id number]  HPI   OV 06/11/2019  Subjective:  Patient ID: [name], female , DOB: 13-Aug-1976 , age 44 y.o. , MRN: [id number] , ADDRESS: [address] [place] [address]   06/11/2019 -   Chief Complaint  Patient presents with  . Follow-up    Patient reports that she's doing much better and her cough is alot better than it was.      HPI [name] 44 y.o. -is being evaluated to the interstitial lung disease clinic.  She is lupus related interstitial lung disease.  Details are all listed below.  She is unaware of the presence of interstitial lung disease.  I personally visualized the scan.  Documented to have ILD in 2011 worse in February 2020 the traction bronchiectasis is worse now in October 2020 later scan.  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  Severity is listed below.  Is associated with arthralgia.  Also since May 2019 she has chronic cough that is very severe.  It gets better with prednisone.  Sometimes clear sputum but sometimes dry.  It is worse when she lies down.  It affects her voice she clears her throat and she does feel a tickle in the back of the throat.  Other symptoms include arthralgia muscle pain rash from lupus and depression. Dyspnea is progressive this year.   Past Medical History :  -   #Lupus -according to her lupus was diagnosed in 1999.  This was at the time of childbirth.  Since then through May 2019 symptoms were mainly malar rash and other skin lesions and arthralgia.  She was on chronic long-term Plaquenil.  She would only be on occasional prednisone.   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  This includes pericardial effusion,  coronary artery disease,  atrial fibrillation.  She says since then she is required prednisone which she took constantly for 3 months through August 2019.  After that she has had intermittent course of prednisone ranging from several days to few months.  In total 7 times.  She has had admissions to the hospital because of this and is listing for heart failure, coronary artery disease and also ILD flareup.  She had a right heart cath and left heart cath.  This was done in May 2020 by Dr. Virgina Jock.  She is status post coronary stent and is on antiplatelet therapy with Plavix and Eliquis.  Her wedge pressures were slightly high and so was a pulmonary artery pressures.  Her longstanding pericardial effusion is being monitored clinically.  There is no evidence of tamponade as of May 2020 right heart catheterization.  She states that she is been on and off prednisone.  In spring 2020 she was started on Imuran by Dr. Melissa Noon team.  Most recently in September 2020 [late September] she tried to wean herself off prednisone but then her shortness of breath cough wheezing and skin lesions and arthralgia all get worse and so she  is back on prednisone.  This most recent prednisone was initiated by our office nurse practitioner.  She feels Imuran is not controlling her lupus as well as the prednisone    #History positive for CHF not otherwise specified coronary artery disease and pericardial effusion and atrial fibrillation  #  she says she has sleep apnea but is not on CPAP.  Details not known.  She has diabetes.  She has DJD  ROS: Positive for fatigue arthralgia discoloration of her fingers in cold weather [Raynaud's] malar rash and other rash snoring heartburn but no oral ulcers.  No nausea vomiting or  diarrhea.   FAMILY HISTORY of LUNG DISEASE: She has a cousin with asthma and grandmother with sarcoidosis.  Another cousin with an autoimmune disease.   EXPOSURE HISTORY: She does not smoke cigarettes.  No electronic cigarette use.  No marijuana use no cocaine use no IV drug abuse.   HOME and HOBBY DETAILS : [place] the last 2 years.  Age of the home is unknown.  In the home there is no history of any organic antigen exposure.  For example she does not live in a damp environment.  There is no mildew in the bathrooms.  There is no humidifier.  She does not use a CPAP.  No nebulizer machine use.  No steam Jacuzzi.  No steam iron.  There is no fountain in the house.  No pet birds no hamsters of pet gerbils.  Does not use feather pillow or duvet.  There is no mold in the [facility] duct.  Does not play musical instruments.  Does not do any gardening.   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  She is also been on colchicine she says.  She is been on prednisone on and off since 2019.  Also Dilantin according to history.  IMPRESSION: HRCT 1. Spectrum of findings compatible with basilar predominant fibrotic interstitial lung disease without frank honeycombing. Slight worsening of traction bronchiectasis since 09/24/2018 high-resolution chest CT. 2. Stable mild cardiomegaly and moderate pericardial effusion. Findings are categorized as probable UIP per consensus guidelines: Diagnosis of Idiopathic Pulmonary Fibrosis: An Official ATS/ERS/JRS/ALAT Clinical Practice Guideline. [place], Iss 5, [phone], Apr 13 2017. 3. Three-vessel coronary atherosclerosis. 4. Stable mild mediastinal lymphadenopathy, compatible with benign reactive etiology.  Aortic Atherosclerosis  (ICD10-I70.0).   Electronically Signed   By: Ilona Sorrel M.D.   On: 06/05/2019 16:47  ROS - per HPI    HEART CATH MAy [id number]  LV end diastolic pressure is normal.   RA: 10 mmHg RV: 46/7 mmHg. RVEDP 8 mmHg PA: 48/22 mmHg. Mean PA 33 mmHg PW: 17 mmHg LV 155/7 mmHg. LVEDP 10 mmHg <10 mmHg LV-Ao pullback gradient CO 7.8 L/min. CI 4 L/min/m2  Simultaneous RV-LV pressure tracings do not show interdependence to suggest constriction physiology.  LM: Wall calcium with no significant stenosis LAD: Ostial-proximal 90% stenosis. Normal diagonal branches. Successful PTCA and stent placement pLAD Synergy DES 3.5X16 mm DES Post dilatation with 4.0 X 8 mm Page balloon. 0% residual stenosis Ramus: Normal LCx: Normal RCA: Normal  Recommendation: Aspirin/plavix/Eliquis for 1 month. Then stop Aspirin, continue eliquis and plavix for 6 months.  Patients medical complicities remain complex. She still needs aggressive management for her lupus and interstitial lung disease. Should  she need lung biopsy in the near future, plavix could potentially be stopped after 3 months use ie in 03/2019.  [name], MD [facility] Pager: [phone] Office: [phone] If no answer Cell [phone]   has a past medical history of Abnormal Pap smear of cervix, Allergy, Anemia, Atrial fibrillation ([place]), CHF (congestive heart failure) ([place]), Depression, Enlarged heart, Fibroid, Gestational diabetes, Heart murmur, Hypertension, Infection, and Lupus ([place]) (1999).   reports that she has never smoked. She has never used smokeless tobacco.  OV 08/19/2019  Subjective:  Patient ID: [name], female , DOB: 17-Jun-1976 , age 31 y.o. , MRN: [id number] , ADDRESS: [id number] [place] [address]   08/19/2019 -   Chief Complaint  Patient presents with  . Follow-up    Pt states she has been doing better since last visit. Pt does have an occ cough which has gotten  better. Pt does become SOB with activities.   Follow-up progressive interstitial lung disease in the setting of connective tissue disease; systemic lupus erythematosus -started pirfenidone July 09, 2019 Has associated  -Morbid obesity with history of snoring: High pretest probability for sleep apnea -Significant systemic lupus erythematosus: Follows with Dr. Amil Amen but having insurance issues -Pericardial effusion and pulmonary hypertension seen on heart catheterization in spring 2020: Follows with Dr. Virgina Jock on expectant follow-up.  HPI KERRI-ANN WILDES 44 y.o. -this follow-up visit is to ensure that she is tolerating her pirfenidone quite well.  She started this on July 09 2019 for progressive ILD.  She tells me that at this point in time she is completely off prednisone.  Without the prednisone she is actually feeling better.  Her sugars are better controlled.  She does not have that much fatigue.  She tells me the pirfenidone is overall helping her [this medicines are supposed to make her feel better but is more preventative antifibrotic].  She feels that even her fatigue and dyspnea are better because of the pirfenidone [I do not know about any anti-inflammatory effects against autoimmune disease with this drug but it can function as an anti-inflammatory].  Her dyspnea is better.  She did have some GI side effects early on with the drug but currently none.  She is having liver function test today.  Today she enrolled in the ILD-pro registry study for progressive non-IPF ILD.  She wants to see a different rheumatologist because of insurance issues she is having difficulty establishing again with Dr. Amil Amen.  She has not yet seen a sleep doctor for his suspected sleep apnea.  She has seen cardiology Dr. Virgina Jock for pericardial effusion and pulmonary hypertension.  She is on expectant follow-up.  I reviewed his notes.  It appears she also had a ER visit because of multifocal atrial  tachycardia symptom score and walking desaturation test are documented below.    OV 09/16/2019  Subjective:  Patient ID: [name], female , DOB: [birth year] , age 71 y.o. , MRN: [id number] , ADDRESS: [address] [place] [address] [place] [address]  Follow-up progressive interstitial lung disease in the setting of connective tissue disease; SLE   -started pirfenidone July 09, 2019  - enrolled ILD-Pro registry study Jan 2021 Has associated  -Morbid obesity with history of snoring: High pretest probability for sleep apnea  -Significant systemic lupus erythematosus:   - Follows with Dr. Amil Amen but having insurance issues  - Referred to Dr Estanislado Pandy - Mary Sella 2021.   - On Pllaquenil since 2000  -  On immuran since early 2020  - s.p prednisone 2018 - 2020 intermittnet only - significant side effects +  -Pericardial effusion and pulmonary hypertension seen on heart catheterization in spring 2020:   - Follows with Dr. Virgina Jock on expectant follow-up.  09/16/2019 -telephone visit Chief Complaint  Patient presents with  . televisit    Called and spoke with pt who states she has been doing okay since last visit. Pt states she has had some issues with nausea.     HPI [name] 45 y.o. -on this telephone visit patient identified with 2 person identifier.  Risks, benefits and limitations of telephone visit explained.  She verbalized and agreed to proceed.  This visit main focus is to see how she is doing with the pirfenidone.  She tells me for the last few weeks she has had nausea in the afternoon.  I found out that she is taking her morning dose between 9 and 10 AM and the afternoon dose between noon and 1 PM.  This is very inadequate spacing.  She did not realize she has a space the dosing at least 5 or 6 hours apart.  She was reeducated on this.  Followed up with her on her sleep issues.  She does not have a follow-up sleep consult appointment.  I also referred her to  Dr D  rheumatologist.  However she is here to establish with her.  She is having insurance issues with Dr. Amil Amen.  She has upcoming appointment with Dr. Virgina Jock who cardiologist.  In terms of her lupus she is not on prednisone but continuing Plaquenil and Imuran.  She is really keen to reestablish with a rheumatologist.     OV 10/21/2019  Chief Complaint  Patient presents with  . Televisit    Called and spoke with pt who states she has been doing okay since last visit. States she did see rheumatology yesterday 3/9. Pt states her breathing is now at a stable point as it is doing better since last visit.   Follow-up progressive interstitial lung disease in the setting of connective tissue disease; SLE   -started pirfenidone July 09, 2019  - enrolled ILD-Pro registry study Jan 2021  Has associated  -Morbid obesity with history of snoring: High pretest probability for sleep apnea  -Significant systemic lupus erythematosus:   - Follows with Dr. Amil Amen but having insurance issues  - Referred to Dr Estanislado Pandy - Mary Sella 2021. - not able to see  - On Pllaquenil since 2000  - On immuran since early 2020  - s.p prednisone 2018 - 2020 intermittnet only - significant side effects +  -Pericardial effusion and pulmonary hypertension seen on heart catheterization in spring 2020:   - Follows with Dr. Virgina Jock on expectant follow-up.     OV 10/21/2019 telephone visit.  Patient identified with 2 person identifier.  Chief Complaint  Patient presents with  . Televisit    Called and spoke with pt who states she has been doing okay since last visit. States she did see rheumatology yesterday 3/9. Pt states her breathing is now at a stable point as it is doing better since last visit.    S:  ILD: Main visit is for esbriet tolerance -after spacing out pirfenidone she has no more nausea she is having some mild diarrhea.  Otherwise feeling good.  Dyspnea is much improved as documented below  Pulmonary  hypertension and pericardial effusion: She has upcoming appointment for echocardiogram and follow-up with Dr. Virgina Jock  Lupus:  She has now established with Dr. Kathlene November.  She is on Imuran and Plaquenil.  She will not do prednisone because of the side effects.    SYMPTOM SCALE - ILD 10/21/2019   O2 use On room air 24/7  Shortness of Breath 0 -> 5 scale with 5 being worst (score 6 If unable to do)  At rest 0  Simple tasks - showers, clothes change, eating, shaving 1  Household (dishes, doing bed, laundry) 1  Shopping 1  Walking level at own pace 2  Walking up Stairs 3  Total (30-36) Dyspnea Score 8  How bad is your cough? 0  How bad is your fatigue 1  How bad is nausea 0  How bad is vomiting?  0  How bad is diarrhea? 2 - she thinks is colchicine but onset after esbriet  How bad is anxiety? 0  How bad is depression 0        Simple office walk 185 feet x  3 laps goal with forehead probe 06/11/2019  08/19/2019 esbeit  O2 used RA RA  Number laps completed 3 3  Comments about pace Nl pace Slow pace due to arthralgia  Resting Pulse Ox/HR 98% and 104/min 100% and 102/min  Final Pulse Ox/HR 96% and 127/min 98% and 118/min  Desaturated </= 88% no no  Desaturated <= 3% points no no  Got Tachycardic >/= 90/min yes yes  Symptoms at end of test Very mild dyspnea Mild dyspnea  Miscellaneous comments *feeling better       Results for CAYSON, YAZELL (MRN [id number]) as of 06/11/2019 09:17  Ref. Range 05/12/2019 08:52  FVC-Pre Latest Units: L 1.65  FVC-%Pred-Pre Latest Units: % 63  FEV1-Pre Latest Units: L 1.48  FEV1-%Pred-Pre Latest Units: % 69  Pre FEV1/FVC ratio Latest Units: % 90   Results for KAYCE, LUECK (MRN [id number]) as of 06/11/2019 09:17  Ref. Range 05/12/2019 08:52  TLC Latest Units: L 2.72  TLC % pred Latest Units: % 61   Results for BILLY, RIVIELLO (MRN [id number]) as of 06/11/2019 09:17  Ref. Range 05/12/2019 08:52  DLCO unc Latest Units: ml/min/mmHg  10.98  DLCO unc % pred Latest Units: % 58     Results for SHANTEE, KAMPMAN (MRN [id number]) as of 10/21/2019 09:25  Ref. Range 09/07/2019 11:35  AST Latest Ref Range: 0 - 40 IU/L 15  ALT Latest Ref Range: 0 - 32 IU/L 12    Review of Systems     Objective:   Physical Exam  Telephone visit sounded normal.        Assessment & Plan:     ICD-10-CM   1. Interstitial lung disease due to connective tissue disease ([place])  J84.89    M35.9   2. Encounter for therapeutic drug monitoring  Z51.81   3. Lupus ([place])  M32.9   4. Snoring  R06.83   5. Morbid obesity with BMI of 45.0-49.9, adult ([place])  E66.01    Z68.42   6. 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 -This was present in 2011 and worse in the scan in February 2020 and subsequently again worsen the scan in October 2020 - we started you on esbriet for this end nov 2020;   -Improved nausea after spacing the medication but having some mild diarrhea. Plan  -Continue pirfenidone as per schedule - but  space dosing by 5-6 hours  - -Ensure you apply sunscreen at all times when going out  -  for diarrhea take Imodium as needed  -Repeat liver function test 10/21/2019 and again in 4 weeks - Tele visit in 4 weeks with Dr Chase Caller or an APP - Spirometry and dlco in 8 weeks - Office visit Sausha Raymond in 8 weeks - 30 min slot    Snoring Morbid obesity with BMI of 45.0-49.9, adult [facility])  - looks like you missed appt  Plan -RE_Refer Dr. Elsworth Soho or Dr. Leretha Pol in our office for undiagnosed sleep apnea -Ultimately will have to figure out a weight loss plan  Lupus ([place])  -Glad you are feeling better without prednisone because of the side effects - Glad plaquenil and immuran is helping -from now on this will be monitored by the rheumatologist. - Glad you established with Dr Kathlene November  10/20/2019  Plan  -per Dr Kathlene November -   Pericardial effusion  -Is described as moderate on the CT scan in October 2020. Same as feb 2020. Per Dr. Virgina Jock this is stable and he is monitoring it.   Plan  - echo this week per Dr Virgina Jock  Pulmonary hypertension -seen on right heart catheterization May 2020 but associated with high wedge pressure  --This was seen in the heart catheterization in May 2020 and was at that time because of excess fluid in the body  Plan  - Right heart cath at some point per Dr Virgina Jock  Follow-up -Refer to sleep specialist within our office -4 weeks telephone visit with aPP or Dr Chase Caller to ensure esbriet tolerance is going well- -Repeat liver function test 10/21/2019 and again in 4 weeks - Tele visit in 4 weeks with Dr Chase Caller or an APP - Spirometry and dlco in 8 weeks - Office visit Yannis Gumbs in 8 weeks - 30 min slot     ( Level 03: Esbt 20-29 min n visit spent in visit type: telephone visit in total care time and counseling or/and coordination of care by this undersigned MD - Dr Brand Males. This includes one or more of the following all delivered on this same day 10/21/2019: pre-charting, chart review, note writing, documentation discussion of test results, diagnostic or treatment recommendations, prognosis, risks and benefits of management options, instructions, education, compliance or risk-factor reduction. It excludes time spent by the [place] or office staff in the care of the patient. Actual time was 20 min)    SIGNATURE    Dr. Brand Males, M.D., F.C.C.P,  Pulmonary and Critical Care Medicine Staff Physician, [place] Director - Interstitial Lung Disease  Program  [facility] [place], [place], [address]  Pager: [phone], If no answer or between  15:00h - 7:00h: call [phone] Telephone: [phone]  9:35 AM 10/21/2019

## 2019-10-21 NOTE — Patient Instructions (Addendum)
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 -This was present in 2011 and worse in the scan in February 2020 and subsequently again worsen the scan in October 2020 - we started you on esbriet for this end nov 2020;   -Improved nausea after spacing the medication but having some mild diarrhea. Plan  -Continue pirfenidone as per schedule - but space dosing by 5-6 hours  - -Ensure you apply sunscreen at all times when going out  -  for diarrhea take Imodium as needed  -Repeat liver function test 10/21/2019 and again in 4 weeks - Tele visit in 4 weeks with Dr Chase Caller or an APP - Spirometry and dlco in 8 weeks - Office visit Mirko Tailor in 8 weeks - 30 min slot    Snoring Morbid obesity with BMI of 45.0-49.9, adult [facility])  - looks like you missed appt  Plan -RE_Refer Dr. Elsworth Soho or Dr. Leretha Pol in our office for undiagnosed sleep apnea -Ultimately will have to figure out a weight loss plan  Lupus ([place])  -Glad you are feeling better without prednisone because of the side effects - Glad plaquenil and immuran is helping -from now on this will be monitored by the rheumatologist. - Glad you established with Dr Kathlene November 10/20/2019  Plan  -per Dr Kathlene November -   Pericardial effusion  -Is described as moderate on the CT scan in October 2020. Same as feb 2020. Per Dr. Virgina Jock this is stable and he is monitoring it.   Plan  - echo this week per Dr Virgina Jock  Pulmonary hypertension -seen on right heart catheterization May 2020 but associated with high wedge pressure  --This was seen in the heart catheterization in May 2020 and was at that time because of excess fluid in the body  Plan  - Right heart cath at some point per Dr Virgina Jock  Follow-up -Refer to sleep specialist within our office -4 weeks telephone visit with aPP or Dr Chase Caller to ensure esbriet tolerance is going well- -Repeat liver  function test 10/21/2019 and again in 4 weeks - Tele visit in 4 weeks with Dr Chase Caller or an APP - Spirometry and dlco in 8 weeks - Office visit Roman Sandall in 8 weeks - 30 min slot

## 2019-10-21 NOTE — Addendum Note (Signed)
Addended by: Lorretta Harp on: 10/21/2019 12:35 PM   Modules accepted: Orders

## 2019-10-21 NOTE — Progress Notes (Signed)
Attempted to call pt at 8:55 to begin televisit but unable to reach. Unable to leave a VM for pt in regards to this due to mailbox being full. Will try to call back to see if we can do the visit.

## 2019-10-21 NOTE — Telephone Encounter (Signed)
Pt had a televisit today 3/10 with MR.  Follow up:  -Refer to sleep specialist within our office -Schedule a 4 week telephone visit with either APP or MR to ensure tolerating esbriet  -Pt needs to come to have labwork done to check liver function -Schedule an 8 week in-office visit with MR after spiro and dlco (91min PFT)  -OV with MR needs to be 36min visit   Attempted to call pt to get appts scheduled but unable to reach. Unable to leave a VM due to mailbox being full.  I am going to mail pt's AVS to her with a letter to have her contact office to get appts scheduled. If pt calls back, please schedule the above appts.

## 2019-10-23 ENCOUNTER — Encounter: Payer: Self-pay | Admitting: Cardiology

## 2019-10-23 ENCOUNTER — Ambulatory Visit: Payer: 59 | Admitting: Cardiology

## 2019-10-23 ENCOUNTER — Other Ambulatory Visit: Payer: Self-pay

## 2019-10-23 VITALS — BP 187/109 | HR 100 | Ht 60.0 in | Wt 228.0 lb

## 2019-10-23 DIAGNOSIS — I2510 Atherosclerotic heart disease of native coronary artery without angina pectoris: Secondary | ICD-10-CM

## 2019-10-23 DIAGNOSIS — I3139 Other pericardial effusion (noninflammatory): Secondary | ICD-10-CM

## 2019-10-23 DIAGNOSIS — I350 Nonrheumatic aortic (valve) stenosis: Secondary | ICD-10-CM

## 2019-10-23 DIAGNOSIS — I313 Pericardial effusion (noninflammatory): Secondary | ICD-10-CM

## 2019-10-23 DIAGNOSIS — I480 Paroxysmal atrial fibrillation: Secondary | ICD-10-CM

## 2019-10-23 NOTE — Progress Notes (Addendum)
Subjective:   [name], female    DOB: [birth year], 44 y.o.   MRN: [id number]   Chief complaint:  Shortness of breath  HPI  44 year old female with long-standing SLE, hypertension, type 2 diabetes mellitus, CAD s/p pLAD PCI for critical stenosis (12/12/2018), paroxysmal Afib w/RVR, moderate aortic stenosis, stable pericardial effusion without tamponade or constriction, interstitial lung disease.  Patient is doing well.  She has had significant improvement in her breathing with ongoing treatment for interstitial lung disease by Dr. Chase Caller.  She denies any chest pain, leg swelling.  Her blood pressure is usually well controlled, but is elevated today.  Reviewed recent echocardiogram with the patient.   Current Outpatient Medications on File Prior to Visit  Medication Sig Dispense Refill  . acetaminophen (TYLENOL) 500 MG tablet Take 1,000 mg by mouth every 6 (six) hours as needed for headache (pain).    Marland Kitchen albuterol (PROVENTIL HFA;VENTOLIN HFA) 108 (90 Base) MCG/ACT inhaler Inhale 2 puffs into the lungs every 6 (six) hours as needed for wheezing or shortness of breath. 1 Inhaler 2  . azaTHIOprine (IMURAN) 50 MG tablet Take 50 mg by mouth 2 (two) times daily.    . Blood Glucose Monitoring Suppl (ONETOUCH VERIO) w/Device KIT 1 each by Does not apply route 3 (three) times daily. 1 kit 0  . cetirizine (ZYRTEC) 10 MG tablet Take 1 tablet (10 mg total) by mouth daily. 90 tablet 1  . clopidogrel (PLAVIX) 75 MG tablet Take 1 tablet (75 mg total) by mouth daily with breakfast. 60 tablet 2  . colchicine 0.6 MG tablet Take 1 tablet (0.6 mg total) by mouth daily. 60 tablet 1  . cyclobenzaprine (FLEXERIL) 10 MG tablet Take 1 tablet (10 mg total) by mouth 2 (two) times daily as needed. 10 tablet 0  . diltiazem (CARDIZEM CD) 240 MG 24 hr capsule Take 1 capsule (240 mg total) by mouth daily. 90 capsule 2  . ELIQUIS 5 MG TABS tablet TAKE 1 TABLET TWICE A DAY 180 tablet 2  . ESBRIET 801 MG TABS  Take 1 tablet by mouth 3 (three) times daily.     . furosemide (LASIX) 40 MG tablet TAKE 1 TABLET BY MOUTH TWICE A DAY 180 tablet 0  . glucose blood (ONETOUCH VERIO) test strip Use as instructed 100 each 12  . hydroxychloroquine (PLAQUENIL) 200 MG tablet Take 1 tablet (200 mg total) by mouth 2 (two) times daily. 60 tablet 4  . Insulin Glargine (BASAGLAR KWIKPEN) 100 UNIT/ML SOPN Inject 20 Units into the skin at bedtime. Inject 0.20m (20units) into skin at bedtime.     . liraglutide (VICTOZA) 18 MG/3ML SOPN INJECT 0.6MG ONCE DAILY INTO THE SKIN, INCREASE TO 1.2MG ONCE DAILY ON WEEK 2, AND 1.8MG ON WEEK 3 (Patient taking differently: Inject 1.8 mg into the skin every morning. ) 3 pen 2  . losartan (COZAAR) 50 MG tablet Take 50 mg by mouth daily.    . medroxyPROGESTERone (DEPO-PROVERA) 150 MG/ML injection Inject 150 mg into the muscle every 3 (three) months.    . metoprolol tartrate (LOPRESSOR) 50 MG tablet Take 1 tablet (50 mg total) by mouth 2 (two) times daily. 180 tablet 1  . OneTouch Delica Lancets 361PMISC 1 each by Does not apply route 3 (three) times daily. 100 each 12  . pantoprazole (PROTONIX) 40 MG tablet Take 1 tablet (40 mg total) by mouth daily. 90 tablet 1  . Phenyleph-CPM-DM-Aspirin (ALKA-SELTZER PLUS COLD & COUGH PO) Take 2 tablets by  mouth every 4 (four) hours as needed (cough/congestion).    . rosuvastatin (CRESTOR) 20 MG tablet TAKE 1 TABLET (20 MG TOTAL) BY MOUTH DAILY AT 6 PM. 90 tablet 1  . traMADol (ULTRAM) 50 MG tablet Take 1 tablet by mouth as needed.    . insulin aspart (NOVOLOG FLEXPEN) 100 UNIT/ML FlexPen Inject 10 Units into the skin 3 (three) times daily with meals. 30 mL 1  . insulin aspart (NOVOLOG) 100 UNIT/ML injection Inject into the skin 3 (three) times daily before meals. Inject 10 units in skin 3 times daily.    . Insulin Glargine (BASAGLAR KWIKPEN) 100 UNIT/ML SOPN Inject 0.2 mLs (20 Units total) into the skin at bedtime. 40 mL 3  . Insulin Pen Needle (TRUEPLUS  PEN NEEDLES) 32G X 4 MM MISC Use as directed to inject insulin 5x daily 200 each 3  . Lancet Devices (ONE TOUCH DELICA LANCING DEV) MISC 1 each by Does not apply route 3 (three) times daily. 1 each 1   No current facility-administered medications on file prior to visit.    Cardiovascular studies:  EKG 10/23/2019: Sinus tachycardia 110 bpm. First degree A-V block  Nonspecific T-abnormality.    CARDIAC STUDIES:  Echocardiogram 10/16/2019:  Normal LV systolic function with visual EF 55-60%. Left ventricle cavity  is normal in size. Normal global wall motion. Moderate left ventricular  hypertrophy. Normal diastolic filling pattern, normal LAP. Calculated EF  54%.  Peak velocity 2.46ms, mean gradient 140mg, AVA 0.9cm2, Dimensional index  0.33, findings suggestive of mild to moderate aortic stenosis.  Mild (Grade I) mitral regurgitation. Moderate calcification of the mitral  valve annulus.  Moderate pericardial effusion predominantly located posteriorly with no  hemodynamic compromise.  Prior study dated 11/2018 noted LVEF of 63 %, grade II diastolic  dysfunction, moderate AS, mild AR, moderate MR, mild TR, RVSP 2537m,  moderate pericardial effusion.  EKG 12/12/2018: Sinus rhythm, aberrant conducted PAC's  R/LHC, coronary angiography and intervention 12/12/2018: RA: 10 mmHg RV: 46/7 mmHg. RVEDP 8 mmHg PA: 48/22 mmHg. Mean PA 33 mmHg PW: 17 mmHg LV 155/7 mmHg. LVEDP 10 mmHg <10 mmHg LV-Ao pullback gradient CO 7.8 L/min. CI 4 L/min/m2  Simultaneous RV-LV pressure tracings do not show interdependence to suggest constriction physiology.  LM: Wall calcium with no significant stenosis LAD: Ostial-proximal 90% stenosis. Normal diagonal branches. Successful PTCA and stent placement pLAD Synergy DES 3.5X16 mm DES Post dilatation with 4.0 X 8 mm Zavala balloon. 0% residual stenosis Ramus: Normal LCx: Normal RCA: Normal   Recent labs: 09/07/2019: Glucose 99, BUN/Cr 10/0.76.  EGFR 110. Na/K 142/3.3. Rest of the CMP normal H/H 11/34. MCV 79. Platelets 337 HbA1C 6.8%  Results for CRASHUNTE, SENSENEYRN [id number] of 10/23/2019 05:45  Ref. Range 04/13/2019 14:25 04/13/2019 16:07 07/06/2019 16:55 07/06/2019 19:46  Troponin I (High Sensitivity) Latest Ref Range: <18 ng/L 42 (H) 44 (H) 36 (H) 40 (H)    Review of Systems  Cardiovascular: Negative for chest pain, dyspnea on exertion, leg swelling, palpitations and syncope.        Vitals:   10/23/19 0938 10/23/19 0945  BP: (!) 170/111 (!) 187/109  Pulse: (!) 113 100  SpO2: 98%     Physical Exam  Constitutional: No distress.  HENT:  Malar rash   Neck: No JVD present.  Cardiovascular: Regular rhythm, normal heart sounds and intact distal pulses. Tachycardia present.  No murmur heard. Pulmonary/Chest: Effort normal and breath sounds normal. She has no wheezes. She has no rales.  Abdominal: Soft. Bowel sounds are normal. There is no rebound.  Musculoskeletal:        General: No edema.  Lymphadenopathy:    She has no cervical adenopathy.  Neurological: No cranial nerve deficit.  Psychiatric: She has a normal mood and affect.  Nursing note and vitals reviewed.         Assessment & Recommendations:   44 year old female with long-standing SLE, hypertension, type 2 diabetes mellitus, CAD s/p pLAD PCI for critical stenosis (12/12/2018), paroxysmal Afib w/RVR, moderate aortic stenosis, stable pericardial effusion without tamponade or constriction (Cath 12/2018), interstitial lung disease, here for transition of care follow up.  Shortness of breath: Improved with treatment of pulmonary fibrosis.   CAD: S/p pLAD PCI. Stopped plavix. No angina symptoms. Continue Crestor 20 mg. Check lipid panel.   Paroxysmal Afib: Continue metoprolol tartarate 50 mg bid, diltiazem to 240 mg daily.  CHA2DS2VASc score 3. On eliquis 5 mg bid  Hypertension:  Blood pressure elevated today, which is unusual for her.  I  have initiated chronic angina for hypertension by office.  No change made to medications today.  Blood pressure remains elevated, will increase metoprolol.  Pericardial effusion: Historically stable without tamponade or constriction. Likely related to lupus. Benefits of pericardiocentesis do not outweigh the risks. Continue lupus management as per rheumatology. Conitnue colchicine 0.6 mg daily.   Moderate aortic stenosis: Aortic stenosis of trileaflet valve.Continue serial monitoring with echocardiogram. Repeat echocardiogram in 09/2019.  Type 2 DM: Continue current management per PCP, along with Jardiance.  Lupus: She has not been able to see them recently. Lupus management remains crucial.  F/u in 6 months, repeat echocardiogram in 1 year   Esther Hardy, MD [facility] Pager: [phone] Office: [phone] If no answer Cell [phone]

## 2019-10-27 IMAGING — DX CHEST - 2 VIEW
2 series · 2 of 2 positions shown · non-contrast
Comparison: December 08, 2018

CLINICAL DATA: Fluid overload and shortness of breath.

EXAM:
CHEST - 2 VIEW

[chest pa]
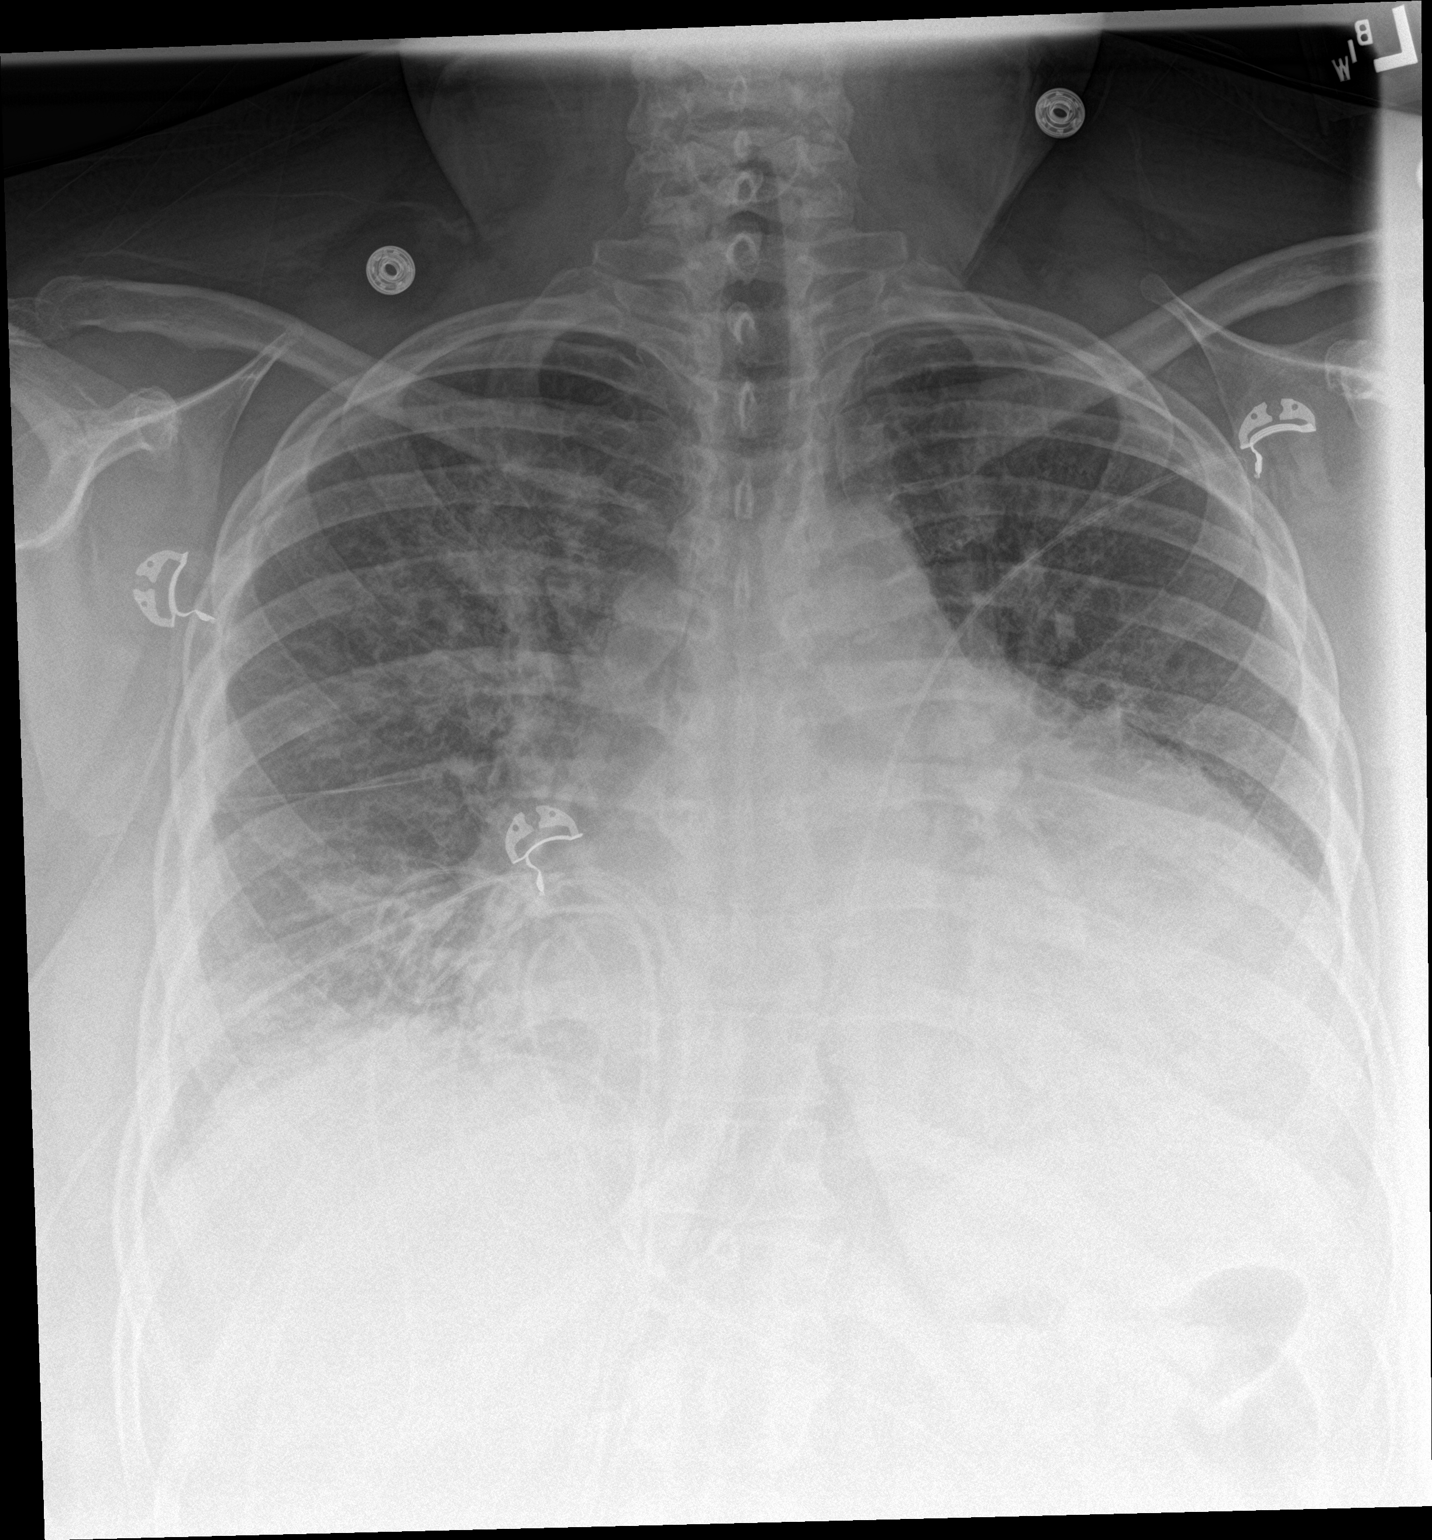

[chest lat]
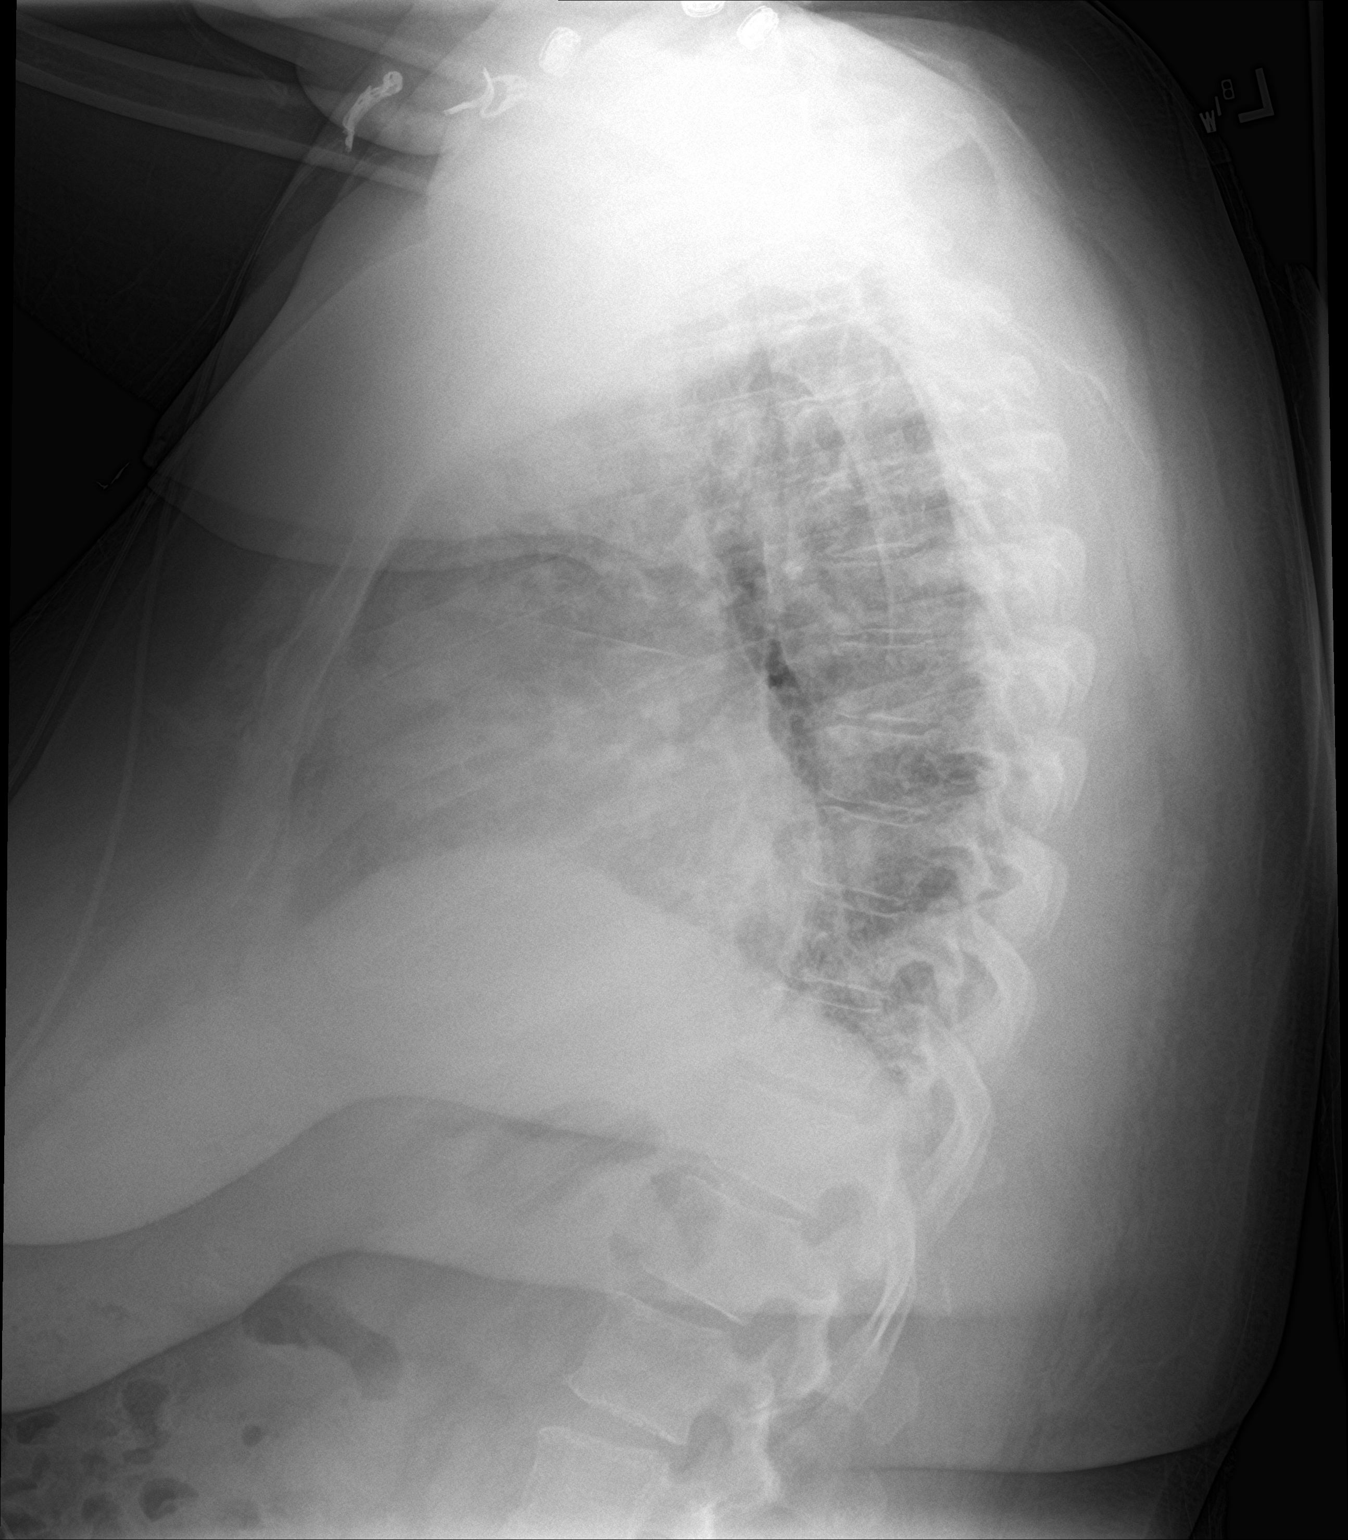

[2 of 2 positions shown; findings below may reference images not displayed]

FINDINGS: Stable cardiomegaly. The hila and mediastinum are unchanged. No
pneumothorax. Opacity in the right lower lobe is stable. Opacity in
the right perihilar region superiorly is more pronounced. No other
interval changes.
IMPRESSION: 1. Increasing infiltrate in the right suprahilar region and stable
infiltrate in the right base concerning for multifocal pneumonia. No
other acute abnormalities.

## 2019-10-29 ENCOUNTER — Ambulatory Visit: Payer: 59

## 2019-10-29 IMAGING — CT CT ANGIOGRAPHY CHEST
1 of 6 series · 2 of 16 positions shown · IV contrast (omnipaque)
Comparison: Chest CT September 24, 2018 and chest radiograph Tiro Sox

CLINICAL DATA: Shortness of breath and tachycardia

EXAM:
CT ANGIOGRAPHY CHEST WITH CONTRAST
TECHNIQUE: Multidetector CT imaging of the chest was performed using the
standard protocol during bolus administration of intravenous
contrast. Multiplanar CT image reconstructions and MIPs were
obtained to evaluate the vascular anatomy.
CONTRAST:  75mL OMNIPAQUE IOHEXOL 350 MG/ML SOLN

[Series 9: pe thins · axial · 0.70mm/px · z∈[+1028,+1112]mm · 2 of 362 slices shown]
[im 121/362  lung]
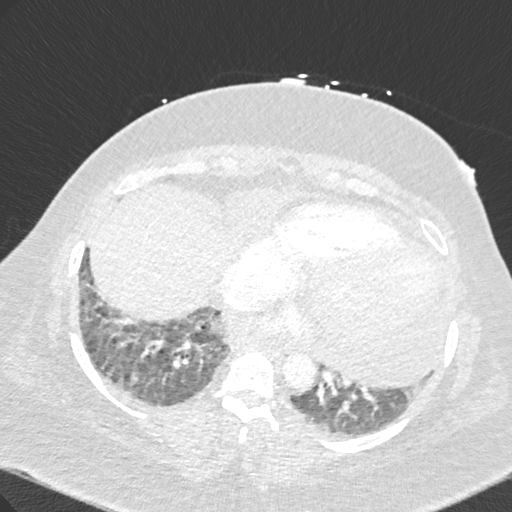
[im 241/362  soft-tissue]
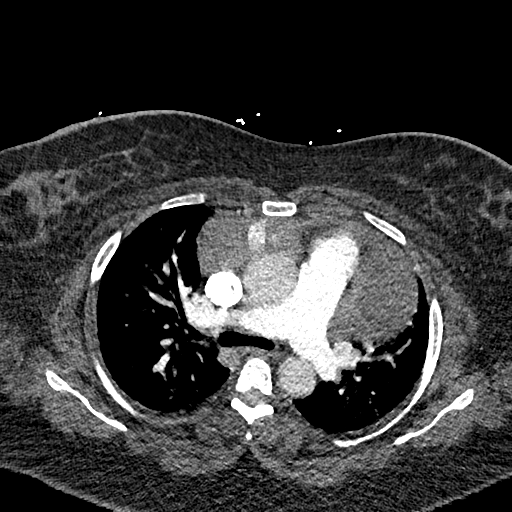

[2 of 16 positions shown; findings below may reference images not displayed]

FINDINGS: Cardiovascular: There is no demonstrable pulmonary embolus. There is
no appreciable thoracic aortic aneurysm or dissection. There are
foci of aortic atherosclerosis. Visualized great vessels appear
normal. There is a sizable pericardial effusion. The pericardium
does not appear thickened.

There is prominence of the main pulmonary outflow tract measuring
3.3 cm.

Mediastinum/Nodes: Visualized thyroid appears normal. There are
several scattered subcentimeter mediastinal lymph nodes. There is no
adenopathy by size criteria. No esophageal lesions are evident.

Lungs/Pleura: There is patchy airspace opacity in both upper lobes,
more on the right than on the left. Similar opacity is noted in the
superior segment right lower lobe. There is bibasilar atelectasis.
There are no appreciable pleural effusions. There is slight
interstitial pulmonary edema.

Upper Abdomen: There is reflux of contrast into the inferior vena
cava and slight reflux into a patent veins. Visualized upper
abdominal structures otherwise appear normal.

Musculoskeletal: No blastic or lytic bone lesions. No chest wall
lesions are evident.

Review of the MIP images confirms the above findings.
IMPRESSION: 1. No demonstrable pulmonary embolus. There is aortic
atherosclerosis. No thoracic aortic aneurysm or dissection.

2.  Sizable pericardial effusion.

3. Prominence of the main pulmonary outflow tract, a finding likely
indicative of pulmonary arterial hypertension.

,

4. Slight interstitial edema. Multifocal airspace opacity, more
notable on the right than on the left. The appearance is more
suggestive of multifocal pneumonia and alveolar edema. A degree of
pneumonia and alveolar edema superimposed may well be present.

5. Reflux of contrast into the inferior vena cava and hepatic veins
may be indicative of a degree of increase in right heart pressure.

Aortic Atherosclerosis ([id number]).

## 2019-11-02 ENCOUNTER — Other Ambulatory Visit: Payer: Self-pay

## 2019-11-02 ENCOUNTER — Ambulatory Visit (INDEPENDENT_AMBULATORY_CARE_PROVIDER_SITE_OTHER): Payer: 59

## 2019-11-02 VITALS — BP 121/65 | HR 56 | Wt 224.3 lb

## 2019-11-02 DIAGNOSIS — Z3042 Encounter for surveillance of injectable contraceptive: Secondary | ICD-10-CM | POA: Diagnosis not present

## 2019-11-02 MED ORDER — MEDROXYPROGESTERONE ACETATE 150 MG/ML IM SUSP
150.0000 mg | Freq: Once | INTRAMUSCULAR | Status: AC
  Administered 2019-11-02: 150 mg via INTRAMUSCULAR

## 2019-11-02 NOTE — Progress Notes (Signed)
Chart reviewed - agree with CMA/RN documentation.  ° °

## 2019-11-02 NOTE — Progress Notes (Signed)
[name] here for Depo-Provera  Injection.  Injection administered without complication. Patient will return in 3 months for next injection.  Annabell Howells, RN 11/02/2019  3:22 PM

## 2019-11-06 ENCOUNTER — Other Ambulatory Visit: Payer: Self-pay | Admitting: Cardiology

## 2019-11-11 ENCOUNTER — Other Ambulatory Visit: Payer: Self-pay | Admitting: Cardiology

## 2019-12-01 ENCOUNTER — Telehealth: Payer: Self-pay | Admitting: Pharmacist

## 2019-12-01 NOTE — Telephone Encounter (Signed)
Called regarding missing BP readings. BP reading while on the call was 177/117. Pt denies any s/sx of CP, HA, slurred speeches, syncope. Reviewed and updated medication list together. Pt recently lost multiple family members to [place] and has several that are still recovering in the hospital. Pt was in distress prior to the call and the elevated BP reading might be as a result of the distress. Pulse rate in 100s. Requested pt to continue checking her BP daily. Will continue to monitor and follow up closely. May consider increasing metoprolol dose to 100 mg BID if BP and pulse continue to be elevated/.

## 2019-12-06 ENCOUNTER — Other Ambulatory Visit: Payer: Self-pay | Admitting: Family Medicine

## 2019-12-06 DIAGNOSIS — E1165 Type 2 diabetes mellitus with hyperglycemia: Secondary | ICD-10-CM

## 2019-12-07 ENCOUNTER — Other Ambulatory Visit: Payer: Self-pay | Admitting: Family Medicine

## 2019-12-07 DIAGNOSIS — I10 Essential (primary) hypertension: Secondary | ICD-10-CM

## 2019-12-25 ENCOUNTER — Other Ambulatory Visit: Payer: Self-pay | Admitting: Cardiology

## 2019-12-31 ENCOUNTER — Ambulatory Visit: Payer: 59 | Admitting: Internal Medicine

## 2020-01-07 ENCOUNTER — Other Ambulatory Visit: Payer: Self-pay | Admitting: Nurse Practitioner

## 2020-01-07 DIAGNOSIS — R053 Chronic cough: Secondary | ICD-10-CM

## 2020-01-07 DIAGNOSIS — R0602 Shortness of breath: Secondary | ICD-10-CM

## 2020-01-12 ENCOUNTER — Emergency Department: Payer: Self-pay

## 2020-01-12 ENCOUNTER — Encounter: Payer: Self-pay | Admitting: Student in an Organized Health Care Education/Training Program

## 2020-01-12 ENCOUNTER — Inpatient Hospital Stay
Admission: EM | Admit: 2020-01-12 | Discharge: 2020-01-17 | DRG: 133 | Disposition: A | Discharge status: Home / Self Care | Payer: PRIVATE HEALTH INSURANCE | Source: Ambulatory Visit | Attending: Internal Medicine | Admitting: Internal Medicine

## 2020-01-12 ENCOUNTER — Other Ambulatory Visit: Payer: Self-pay | Admitting: Cardiology

## 2020-01-12 ENCOUNTER — Inpatient Hospital Stay: Payer: PRIVATE HEALTH INSURANCE

## 2020-01-12 DIAGNOSIS — R918 Other nonspecific abnormal finding of lung field: Secondary | ICD-10-CM

## 2020-01-12 DIAGNOSIS — E119 Type 2 diabetes mellitus without complications: Secondary | ICD-10-CM | POA: Diagnosis present

## 2020-01-12 DIAGNOSIS — I2510 Atherosclerotic heart disease of native coronary artery without angina pectoris: Secondary | ICD-10-CM | POA: Diagnosis present

## 2020-01-12 DIAGNOSIS — I129 Hypertensive chronic kidney disease with stage 1 through stage 4 chronic kidney disease, or unspecified chronic kidney disease: Secondary | ICD-10-CM | POA: Diagnosis present

## 2020-01-12 DIAGNOSIS — J9601 Acute respiratory failure with hypoxia: Principal | ICD-10-CM | POA: Diagnosis present

## 2020-01-12 DIAGNOSIS — J8410 Pulmonary fibrosis, unspecified: Secondary | ICD-10-CM | POA: Diagnosis present

## 2020-01-12 DIAGNOSIS — J84112 Idiopathic pulmonary fibrosis: Secondary | ICD-10-CM

## 2020-01-12 DIAGNOSIS — M329 Systemic lupus erythematosus, unspecified: Secondary | ICD-10-CM

## 2020-01-12 DIAGNOSIS — E785 Hyperlipidemia, unspecified: Secondary | ICD-10-CM | POA: Diagnosis present

## 2020-01-12 DIAGNOSIS — K219 Gastro-esophageal reflux disease without esophagitis: Secondary | ICD-10-CM | POA: Diagnosis present

## 2020-01-12 DIAGNOSIS — R7989 Other specified abnormal findings of blood chemistry: Secondary | ICD-10-CM

## 2020-01-12 DIAGNOSIS — I480 Paroxysmal atrial fibrillation: Secondary | ICD-10-CM | POA: Diagnosis present

## 2020-01-12 DIAGNOSIS — Z794 Long term (current) use of insulin: Secondary | ICD-10-CM

## 2020-01-12 DIAGNOSIS — R0600 Dyspnea, unspecified: Secondary | ICD-10-CM

## 2020-01-12 DIAGNOSIS — I3139 Other pericardial effusion (noninflammatory): Secondary | ICD-10-CM

## 2020-01-12 DIAGNOSIS — M3213 Lung involvement in systemic lupus erythematosus: Secondary | ICD-10-CM | POA: Diagnosis present

## 2020-01-12 DIAGNOSIS — M351 Other overlap syndromes: Secondary | ICD-10-CM

## 2020-01-12 DIAGNOSIS — I313 Pericardial effusion (noninflammatory): Secondary | ICD-10-CM

## 2020-01-12 DIAGNOSIS — Z955 Presence of coronary angioplasty implant and graft: Secondary | ICD-10-CM

## 2020-01-12 DIAGNOSIS — R0489 Hemorrhage from other sites in respiratory passages: Secondary | ICD-10-CM

## 2020-01-12 DIAGNOSIS — I350 Nonrheumatic aortic (valve) stenosis: Secondary | ICD-10-CM | POA: Diagnosis present

## 2020-01-12 DIAGNOSIS — R9431 Abnormal electrocardiogram [ECG] [EKG]: Secondary | ICD-10-CM

## 2020-01-12 DIAGNOSIS — I517 Cardiomegaly: Secondary | ICD-10-CM

## 2020-01-12 DIAGNOSIS — Z20822 Contact with and (suspected) exposure to covid-19: Secondary | ICD-10-CM | POA: Diagnosis present

## 2020-01-12 DIAGNOSIS — R05 Cough: Secondary | ICD-10-CM

## 2020-01-12 DIAGNOSIS — I509 Heart failure, unspecified: Secondary | ICD-10-CM | POA: Diagnosis present

## 2020-01-12 DIAGNOSIS — I110 Hypertensive heart disease with heart failure: Secondary | ICD-10-CM

## 2020-01-12 DIAGNOSIS — I2584 Coronary atherosclerosis due to calcified coronary lesion: Secondary | ICD-10-CM

## 2020-01-12 LAB — ECHO COMPLETE
AV Area (LV SV Mtd): 1.17 cm2
AV Area (LV SV) BSA Index: 0.54 cm2/m2
AV Area (LVOT SR Mtd): 1.06 cm2
AV Area (LVOT SV Mtd): 1.09 cm2
AV CWD VTI: 46 cm
AV CWD Velocity (Mean): 233.4 cm/s
AV CWD Velocity (Peak): 298.6 cm/s
AV Gradient (peak): 30.6 mmHg
Aortic Diameter (mid tubular): 3.6 cm
Aortic Diameter (sinus of Valsalva): 3 cm
BMI: 48.9 kg/m2
BP Diastolic: 114 mmHg
BP Systolic: 154 mmHg
BSA: 2.19 m2
Echo RV Stroke Work Index Estimate: 1183.6 mmHg•mL/m2
Heart Rate: 140 {beats}/min
Height: 60 in
LA Diameter BSA Index: 2.2 cm/m2
LA Diameter Height Index: 3.1 cm/m
LA Diameter: 4.8 cm
LA Systolic Vol BSA Index: 33.8 mL/m2
LA Systolic Vol Height Index: 48.6 mL/m
LA Systolic Volume: 74 mL
LV ASE Mass BSA Index: 110.9 gm/m2
LV ASE Mass Height 2.7 Index: 77.8 gm/m2.7
LV ASE Mass Height Index: 159.3 gm/m
LV ASE Mass: 242.8 gm
LV CO BSA Index: 3.45 L/min/m2
LV Cardiac Output: 7.56 L/min
LV Diastolic Volume Index: 37.9 mL/m2
LV Posterior Wall Thickness: 1.25 cm
LV SV - LVOT SV Diff: 3.98 mL
LV SV - LVOT SV Diff: 7.38 %
LV SV BSA Index: 24.7 mL/m2
LV SV Height Index: 35.4 mL/m
LV Septal Thickness: 1.26 cm
LV Stroke Volume: 54 mL
LV Systolic Volume Index: 13.2 mL/m2
LV wall/cavity ratio: 0.51
LVED Diameter BSA Index: 2.25 cm/m2
LVED Diameter Height Index: 3.23 cm/m
LVED Diameter: 4.92 cm
LVED Volume BSA Index: 37.9 mL/m2
LVED Volume BSA Index: 38 ml/m2
LVED Volume Height Index: 54.5 mL/m
LVED Volume: 83 mL
LVEF (Volume): 65 %
LVES Volume BSA Index: 13 ml/m2
LVES Volume BSA Index: 13.2 mL/m2
LVES Volume Height Index: 19 mL/m
LVES Volume: 29 mL
LVOT + AV Gradient (peak): 35.7 mmHg
LVOT Area (calculated): 2.83 cm2
LVOT Cardiac Index: 3.2 L/min/m2
LVOT Cardiac Output: 7 L/min
LVOT Diameter: 1.9 cm
LVOT PWD VTI: 17.65 cm
LVOT PWD Velocity (mean): 87.1 cm/s
LVOT PWD Velocity (peak): 112.1 cm/s
LVOT SV BSA Index: 22.84 mL/m2
LVOT SV Height Index: 32.8 mL/m
LVOT Stroke Rate (mean): 246.8 mL/s
LVOT Stroke Rate (peak): 317.7 mL/s
LVOT Stroke Volume: 50.02 cc
LVOT/AV Velocity Ratio: 0.38
MR Regurgitant Fraction (LV SV Mtd): 0.07
MR Regurgitant Volume (LV SV Mtd): 4 mL
Mean Gradient: 23.2 mmHg
Peak Gradient - TR: 48 mmHg
Peak Velocity - TR: 318.92 cm/s
Pulmonary Vascular Resistance Estimate: 6.3 mmHg
RA Pressure Estimate: 10 mmHg
RA Volume BSA Index: 34.2 mL/m2
RA Volume Height Index: 49.2 mL/m
RA Volume: 75 mL
RR Interval: 428.57 ms
RV Peak Systolic Pressure: 58 mmHg
Weight (lbs): 250 [lb_av]
Weight: 4000.03 oz

## 2020-01-12 LAB — D-DIMER, QUANTITATIVE: D-Dimer: 2.33 ug/mL FEU — ABNORMAL HIGH (ref 0.00–0.50)

## 2020-01-12 LAB — VENOUS BLOOD GAS
Base Excess,VENOUS: 1 mmol/L (ref <=2)
Bicarbonate,VENOUS: 25 mmol/L (ref 21–28)
CO2 (Calc),VENOUS: 26 mmol/L (ref 22–31)
CO: 1.3 %
FO2 HB,VENOUS: 29 % — ABNORMAL LOW (ref 63–83)
Hemoglobin: 12.7 g/dL (ref 11.2–15.7)
Methemoglobin: 0.3 % (ref 0.0–1.0)
PCO2,VENOUS: 40 mm Hg (ref 40–50)
PH,VENOUS: 7.42 (ref 7.32–7.42)
PO2,VENOUS: 24 mm Hg — ABNORMAL LOW (ref 25–43)

## 2020-01-12 LAB — HOLD GREEN WITH GEL

## 2020-01-12 LAB — CBC AND DIFFERENTIAL
Baso # K/uL: 0 10*3/uL (ref 0.0–0.1)
Basophil %: 0.3 %
Eos # K/uL: 0 10*3/uL (ref 0.0–0.4)
Eosinophil %: 0.1 %
Hematocrit: 34 % (ref 34–45)
Hemoglobin: 10.6 g/dL — ABNORMAL LOW (ref 11.2–15.7)
IMM Granulocytes #: 0.1 10*3/uL — ABNORMAL HIGH (ref 0.0–0.0)
IMM Granulocytes: 0.5 %
Lymph # K/uL: 0.6 10*3/uL — ABNORMAL LOW (ref 1.2–3.7)
Lymphocyte %: 6.8 %
MCH: 25 pg — ABNORMAL LOW (ref 26–32)
MCHC: 31 g/dL — ABNORMAL LOW (ref 32–36)
MCV: 80 fL (ref 79–95)
Mono # K/uL: 0.7 10*3/uL (ref 0.2–0.9)
Monocyte %: 7.4 %
Neut # K/uL: 7.9 10*3/uL — ABNORMAL HIGH (ref 1.6–6.1)
Nucl RBC # K/uL: 0 10*3/uL (ref 0.0–0.0)
Nucl RBC %: 0.2 /100 WBC (ref 0.0–0.2)
Platelets: 335 10*3/uL (ref 160–370)
RBC: 4.3 MIL/uL (ref 3.9–5.2)
RDW: 15.3 % — ABNORMAL HIGH (ref 11.7–14.4)
Seg Neut %: 84.9 %
WBC: 9.3 10*3/uL (ref 4.0–10.0)

## 2020-01-12 LAB — C4 COMPLEMENT: C4: 24 mg/dL (ref 10–40)

## 2020-01-12 LAB — URINALYSIS REFLEX TO CULTURE
Blood,UA: NEGATIVE
Leuk Esterase,UA: NEGATIVE
Nitrite,UA: NEGATIVE
Specific Gravity,UA: 1.031 — ABNORMAL HIGH (ref 1.002–1.030)
pH,UA: 6.5 (ref 5.0–8.0)

## 2020-01-12 LAB — LACTATE, PLASMA: Lactate: 4.9 mmol/L (ref 0.5–2.2)

## 2020-01-12 LAB — BASIC METABOLIC PANEL
Anion Gap: 15 (ref 7–16)
CO2: 26 mmol/L (ref 20–28)
Calcium: 9 mg/dL (ref 8.8–10.2)
Chloride: 100 mmol/L (ref 96–108)
Creatinine: 0.7 mg/dL (ref 0.51–0.95)
GFR,Black: 122 *
GFR,Caucasian: 105 *
Glucose: 118 mg/dL — ABNORMAL HIGH (ref 60–99)
Lab: 11 mg/dL (ref 6–20)
Potassium: 3.3 mmol/L (ref 3.3–5.1)
Sodium: 141 mmol/L (ref 133–145)

## 2020-01-12 LAB — POCT GLUCOSE
Glucose POCT: 228 mg/dL — ABNORMAL HIGH (ref 60–99)
Glucose POCT: 269 mg/dL — ABNORMAL HIGH (ref 60–99)

## 2020-01-12 LAB — PHOSPHORUS: Phosphorus: 3.6 mg/dL (ref 2.7–4.5)

## 2020-01-12 LAB — SEDIMENTATION RATE, AUTOMATED: Sedimentation Rate: 89 mm/hr — ABNORMAL HIGH (ref 0–20)

## 2020-01-12 LAB — URINE MICROSCOPIC (IQ200): Hyaline Casts,UA: NONE SEEN /lpf (ref 0–5)

## 2020-01-12 LAB — CRP: CRP: 139 mg/L — ABNORMAL HIGH (ref 0–8)

## 2020-01-12 LAB — HEPATIC FUNCTION PANEL
ALT: 17 U/L (ref 0–35)
AST: 28 U/L (ref 0–35)
Albumin: 3.7 g/dL (ref 3.5–5.2)
Alk Phos: 89 U/L (ref 35–105)
Bilirubin,Direct: <0.2 mg/dL (ref 0.0–0.3)
Bilirubin,Total: 0.3 mg/dL (ref 0.0–1.2)
Total Protein: 7.4 g/dL (ref 6.3–7.7)

## 2020-01-12 LAB — PROCALCITONIN: Procalcitonin: 0.21 ng/mL — ABNORMAL HIGH (ref 0.00–0.08)

## 2020-01-12 LAB — PERFORMING LAB

## 2020-01-12 LAB — PREGNANCY TEST, SERUM: Preg,Serum: NEGATIVE

## 2020-01-12 LAB — MAGNESIUM: Magnesium: 1.5 mg/dL — ABNORMAL LOW (ref 1.6–2.5)

## 2020-01-12 LAB — LACTATE DEHYDROGENASE: LD: 563 U/L — ABNORMAL HIGH (ref 118–225)

## 2020-01-12 LAB — GRAM STAIN: Gram Stain: 0

## 2020-01-12 LAB — C3 COMPLEMENT: C3: 149 mg/dL (ref 90–180)

## 2020-01-12 LAB — COVID-19 PCR

## 2020-01-12 LAB — INFLUENZA A: Influenza A PCR: 0

## 2020-01-12 LAB — RSV PCR: RSV PCR: 0

## 2020-01-12 LAB — S. PNEUMONIAE ANTIGEN: S. pneumoniae Antigen: 0

## 2020-01-12 LAB — AEROBIC CULTURE

## 2020-01-12 LAB — LEGIONELLA ANTIGEN, URINE: Legionella Antigen (Urine): 0

## 2020-01-12 LAB — NT-PRO BNP: NT-pro BNP: 2750 pg/mL — ABNORMAL HIGH (ref 0–450)

## 2020-01-12 LAB — COVID-19 NAAT (PCR): COVID-19 NAAT (PCR): NEGATIVE

## 2020-01-12 LAB — INFLUENZA B PCR: Influenza B PCR: 0

## 2020-01-12 LAB — TSH: TSH: 1.73 u[IU]/mL (ref 0.27–4.20)

## 2020-01-12 MED ORDER — APIXABAN 5 MG PO TABS *I*
5.0000 mg | ORAL_TABLET | Freq: Two times a day (BID) | ORAL | Status: DC
Start: 2020-01-12 — End: 2020-01-12

## 2020-01-12 MED ORDER — HYDROXYCHLOROQUINE SULFATE 200 MG PO TABS *I*
200.0000 mg | ORAL_TABLET | Freq: Two times a day (BID) | ORAL | Status: DC
Start: 2020-01-12 — End: 2020-01-12
  Filled 2020-01-12: qty 1

## 2020-01-12 MED ORDER — METOPROLOL TARTRATE 1 MG/ML IV SOLN *I*
INTRAVENOUS | Status: DC
Start: 2020-01-12 — End: 2020-01-12
  Filled 2020-01-12: qty 5

## 2020-01-12 MED ORDER — LACTATED RINGERS IV BOLUS *I*
1000.0000 mL | Freq: Once | INTRAVENOUS | Status: AC
Start: 2020-01-12 — End: 2020-01-12
  Administered 2020-01-12: 1000 mL via INTRAVENOUS

## 2020-01-12 MED ORDER — GLUCOSE 40 % PO GEL *I*
15.0000 g | ORAL | Status: DC | PRN
Start: 2020-01-12 — End: 2020-01-17

## 2020-01-12 MED ORDER — METOPROLOL TARTRATE 50 MG PO TABS *I*
50.0000 mg | ORAL_TABLET | Freq: Four times a day (QID) | ORAL | Status: DC
Start: 2020-01-12 — End: 2020-01-14
  Administered 2020-01-12 – 2020-01-14 (×8): 50 mg via ORAL
  Filled 2020-01-12: qty 2
  Filled 2020-01-12 (×7): qty 1

## 2020-01-12 MED ORDER — INSULIN GLARGINE 100 UNIT/ML SC SOLN *WRAPPED*
10.0000 [IU] | Freq: Every evening | SUBCUTANEOUS | Status: DC
Start: 2020-01-12 — End: 2020-01-13
  Administered 2020-01-12: 10 [IU] via SUBCUTANEOUS
  Filled 2020-01-12: qty 10

## 2020-01-12 MED ORDER — METOPROLOL TARTRATE 1 MG/ML IV SOLN *I*
5.0000 mg | Freq: Once | INTRAVENOUS | Status: AC
Start: 2020-01-12 — End: 2020-01-12
  Administered 2020-01-12: 5 mg via INTRAVENOUS

## 2020-01-12 MED ORDER — SODIUM CHLORIDE 0.9 % FLUSH FOR PUMPS *I*
0.0000 mL/h | INTRAVENOUS | Status: DC | PRN
Start: 2020-01-12 — End: 2020-01-17

## 2020-01-12 MED ORDER — METOPROLOL TARTRATE 25 MG PO TABS *I*
50.0000 mg | ORAL_TABLET | Freq: Two times a day (BID) | ORAL | Status: DC
Start: 2020-01-12 — End: 2020-01-12

## 2020-01-12 MED ORDER — PIPERACILLIN/TAZOBACTAM 4.5 G IN NS MINI-BAG PLUS 110 ML *I*
4.5000 g | Freq: Once | INTRAVENOUS | Status: AC
Start: 2020-01-12 — End: 2020-01-12
  Administered 2020-01-12: 4.5 g via INTRAVENOUS
  Filled 2020-01-12: qty 110

## 2020-01-12 MED ORDER — GLUCAGON HCL (RDNA) 1 MG IJ SOLR *WRAPPED*
1.0000 mg | INTRAMUSCULAR | Status: DC | PRN
Start: 2020-01-12 — End: 2020-01-17

## 2020-01-12 MED ORDER — METHYLPREDNISOLONE SOD SUCC 125 MG IJ SOLR(62.5MG/ML) *WRAPPED*
60.0000 mg | Freq: Two times a day (BID) | INTRAMUSCULAR | Status: DC
Start: 2020-01-12 — End: 2020-01-14
  Administered 2020-01-12 – 2020-01-14 (×4): 60 mg via INTRAVENOUS
  Filled 2020-01-12 (×4): qty 2

## 2020-01-12 MED ORDER — IPRATROPIUM-ALBUTEROL 0.5-2.5 MG/3ML IN SOLN *I*
3.0000 mL | Freq: Four times a day (QID) | RESPIRATORY_TRACT | Status: DC
Start: 2020-01-12 — End: 2020-01-12

## 2020-01-12 MED ORDER — VANCOMYCIN IV - PHARMACIST TO DOSE PLACEHOLDER *I*
Status: DC
Start: 2020-01-12 — End: 2020-01-13

## 2020-01-12 MED ORDER — METOPROLOL TARTRATE 25 MG PO TABS *I*
25.0000 mg | ORAL_TABLET | Freq: Two times a day (BID) | ORAL | Status: DC
Start: 2020-01-12 — End: 2020-01-12

## 2020-01-12 MED ORDER — PIPERACILLIN/TAZOBACTAM 4.5 G IN NS MINI-BAG PLUS 110 ML *I*
4.5000 g | Freq: Three times a day (TID) | INTRAVENOUS | Status: DC
Start: 2020-01-12 — End: 2020-01-13
  Administered 2020-01-12 – 2020-01-13 (×2): 4.5 g via INTRAVENOUS
  Filled 2020-01-12: qty 4.5
  Filled 2020-01-12 (×4): qty 110

## 2020-01-12 MED ORDER — FUROSEMIDE 10 MG/ML IJ SOLN *I*
40.0000 mg | Freq: Once | INTRAMUSCULAR | Status: DC
Start: 2020-01-12 — End: 2020-01-12

## 2020-01-12 MED ORDER — ACETAMINOPHEN 325 MG PO TABS *I*
650.0000 mg | ORAL_TABLET | ORAL | Status: DC | PRN
Start: 2020-01-12 — End: 2020-01-17
  Administered 2020-01-13: 650 mg via ORAL
  Filled 2020-01-12: qty 2

## 2020-01-12 MED ORDER — INSULIN LISPRO (HUMAN) 100 UNIT/ML IJ/SC SOLN *WRAPPED*
0.0000 [IU] | Freq: Three times a day (TID) | SUBCUTANEOUS | Status: DC
Start: 2020-01-12 — End: 2020-01-14
  Administered 2020-01-12: 8 [IU] via SUBCUTANEOUS
  Administered 2020-01-13: 9 [IU] via SUBCUTANEOUS
  Administered 2020-01-13: 8 [IU] via SUBCUTANEOUS
  Administered 2020-01-13: 6 [IU] via SUBCUTANEOUS

## 2020-01-12 MED ORDER — DEXTROSE 50 % IV SOLN *I*
25.0000 g | INTRAVENOUS | Status: DC | PRN
Start: 2020-01-12 — End: 2020-01-17

## 2020-01-12 MED ORDER — PANTOPRAZOLE SODIUM 40 MG PO TBEC *I*
40.0000 mg | DELAYED_RELEASE_TABLET | Freq: Every morning | ORAL | Status: DC
Start: 2020-01-13 — End: 2020-01-17
  Administered 2020-01-13 – 2020-01-17 (×5): 40 mg via ORAL
  Filled 2020-01-12 (×5): qty 1

## 2020-01-12 MED ORDER — FUROSEMIDE 10 MG/ML IJ SOLN *I*
40.0000 mg | Freq: Once | INTRAMUSCULAR | Status: AC
Start: 2020-01-12 — End: 2020-01-12
  Administered 2020-01-12: 40 mg via INTRAVENOUS
  Filled 2020-01-12: qty 4

## 2020-01-12 MED ORDER — CETIRIZINE HCL 10 MG PO TABS *I*
10.0000 mg | ORAL_TABLET | Freq: Every day | ORAL | Status: DC
Start: 2020-01-13 — End: 2020-01-17
  Administered 2020-01-13 – 2020-01-17 (×5): 10 mg via ORAL
  Filled 2020-01-12 (×5): qty 1

## 2020-01-12 MED ORDER — VANCOMYCIN HCL IN NACL 2000 MG/550 ML IV SOLN *I*
2000.0000 mg | Freq: Two times a day (BID) | INTRAVENOUS | Status: DC
Start: 2020-01-12 — End: 2020-01-13
  Administered 2020-01-12 – 2020-01-13 (×2): 2000 mg via INTRAVENOUS
  Filled 2020-01-12 (×3): qty 550
  Filled 2020-01-12: qty 2000

## 2020-01-12 MED ORDER — IOHEXOL 350 MG/ML (OMNIPAQUE) IV SOLN *I*
1.0000 mL | Freq: Once | INTRAVENOUS | Status: AC
Start: 2020-01-12 — End: 2020-01-12
  Administered 2020-01-12: 70 mL via INTRAVENOUS

## 2020-01-12 MED ORDER — IPRATROPIUM-ALBUTEROL 0.5-2.5 MG/3ML IN SOLN *I*
3.0000 mL | Freq: Four times a day (QID) | RESPIRATORY_TRACT | Status: DC | PRN
Start: 2020-01-12 — End: 2020-01-17

## 2020-01-12 MED ORDER — COLCHICINE 0.6 MG PO TABS *I*
0.6000 mg | ORAL_TABLET | Freq: Every day | ORAL | Status: DC
Start: 2020-01-13 — End: 2020-01-14
  Administered 2020-01-13 – 2020-01-14 (×2): 0.6 mg via ORAL
  Filled 2020-01-12 (×2): qty 1

## 2020-01-12 MED ORDER — PERFLUTREN PROTEIN A MICROSPH (OPTISON) IV SUSP *I*
1.5000 mL | INTRAVENOUS | Status: AC | PRN
Start: 2020-01-12 — End: 2020-01-12
  Administered 2020-01-12: 1.5 mL via INTRAVENOUS

## 2020-01-12 MED ORDER — DEXTROSE 5 % FLUSH FOR PUMPS *I*
0.0000 mL/h | INTRAVENOUS | Status: DC | PRN
Start: 2020-01-12 — End: 2020-01-17

## 2020-01-12 MED ORDER — IPRATROPIUM-ALBUTEROL 0.5-2.5 MG/3ML IN SOLN *I*
9.0000 mL | Freq: Once | RESPIRATORY_TRACT | Status: AC
Start: 2020-01-12 — End: 2020-01-12
  Administered 2020-01-12: 9 mL via RESPIRATORY_TRACT
  Filled 2020-01-12: qty 9

## 2020-01-12 MED ORDER — METHYLPREDNISOLONE SOD SUCC 125 MG IJ SOLR(62.5MG/ML) *WRAPPED*
125.0000 mg | Freq: Once | INTRAMUSCULAR | Status: AC
Start: 2020-01-12 — End: 2020-01-12
  Administered 2020-01-12: 125 mg via INTRAVENOUS
  Filled 2020-01-12: qty 2

## 2020-01-12 MED ORDER — ROSUVASTATIN CALCIUM 20 MG PO TABS *I*
20.0000 mg | ORAL_TABLET | Freq: Every day | ORAL | Status: DC
Start: 2020-01-12 — End: 2020-01-17
  Administered 2020-01-12 – 2020-01-17 (×6): 20 mg via ORAL
  Filled 2020-01-12 (×7): qty 1

## 2020-01-12 NOTE — ED Notes (Signed)
Report Given To  Caryn Bee, RN      Descriptive Sentence / Reason for Admission   Pt arrived to ED with complaint of SOB and tachycardia into the 140's. Pt has a hx of lupus and AFIB. Progressive cough and dyspnea.       Active Issues / Relevant Events   -CT neg PE  -Lung Xray shows opacities in lung hx fibriods in lung d/t lupus   -Covid neg  -full code   -Pulmonary Edema on Lasix   -New onset of CHF   -Currenty 6L NC baseline is Room air  -Tele       To Do List  -BG ACHS   -Meds per mar  - abx / lasix  -labs as ordered   - tele monitor HR  -Collect Urine sample   -VS/A Q4      Anticipatory Guidance / Discharge Planning  Admit for onset of CHF and Pulmonology work up

## 2020-01-12 NOTE — Plan of Care (Signed)
PFTs (05/11/2020)  -Indication: new diagnosis of ILD (IPF secondary to SLE).    FVC = 1.65 (63% of predicted).  FEV1 = 1.48 (69% of predicted).  FEV1/FVC = 90 (108% of predicted).  DLCO = 10.98 (58% of predicted).    Gathered from [facility] records.    Amalia Hailey  Medical Student, Year 3

## 2020-01-12 NOTE — H&P (Addendum)
Medical ICU History and Physical Note  LOS: 0    HPI: Jodi Holmes is a 44 y.o. female with PMH significant for lupus c/b pulmonary fibrosis, afib s/p ablation, HTN, HLD, CHF, and DMT2 who presents for dyspnea.     Per report, patient states that she has had dyspnea on exertion with going up stairs for the past several years. She notes that this has been waxing and waning for several years. She had been on steroids on and off four her lupus flares. She endorses being on azathioprine 50mg  BID, esbriet 801mg  TID, and plaquenil 200mg  BID for her lupus. She has not been on steroids recently. She states that she is in town to visit her family, but lives in currently. She states that she was diagnosed with pulmonary fibrosis back in September, but her SOB has been relatively stable. She also endorses chronic, intermittently productive cough. She notes that her SOB has been progressively worsening over the past week and her cough has becoming more productive and blood tinged. She endorses orthopnea, but denies any fevers, chills, chest pain, palpitations and leg swelling.    In the ED, patient is afebrile, tachycardic to the 110-120's in afib, tachypnic to the 20's. Patient saturating well on 6LNC (pleth appeared inaccurate). ED workup notable for BNP 2750 (no prior in our records), D-dimer 2.33, H/H 10.6/34, and COVID negative. CXR shows extensive bilateral lung opacities, suspicious for pulmonary edema vs infection vs fibrotic lung disease. CT angio showed no evidence of PE, extensive GGO with mild edema. Moderate to large pericardial effusion and evidence of elevated right heart pressures was noted. Echo is notably a technically difficult study, concentric LVH with abnormal diastolic function and LA enlargement, normal LVEF, right heart enlargement/dysfunction with moderately elevated PA pressure, and small pericardial effusion.    She was given 1L IVF, duoneb x1, metoprolol IV 5mg  x1, methypred 125mg   x1, and 40mg  of IV lasix. She was admitted to hospital medicine. Infectious and autoimmune workup was sent. Pulmonology was consulted. She was started on broad spectrum antibiotics.     Upon my assessment, Ms. Belitz is very pleasant and does not appear in acute distress. She does not have any increased work of breathing. She endorses some SOB and some rib pain from coughing. She states that the Pulmonology team was just in to see her and noticed her coughing up some small amounts of blood.    ROS:  Review of Systems   Constitutional: Positive for malaise/fatigue. Negative for chills, diaphoresis and fever.   Eyes: Negative for blurred vision and double vision.   Respiratory: Positive for cough, sputum production and shortness of breath. Negative for hemoptysis and wheezing.    Cardiovascular: Positive for orthopnea. Negative for chest pain and leg swelling.   Gastrointestinal: Negative for abdominal pain, blood in stool, constipation, diarrhea, melena, nausea and vomiting.   Genitourinary: Negative for dysuria and hematuria.   Musculoskeletal: Negative for joint pain.   Skin: Negative for rash.   Neurological: Negative for dizziness, sensory change, weakness and headaches.         Medical/Surgical/Family History   PMH:  History reviewed. No pertinent past medical history.     PSH:  History reviewed. No pertinent surgical history.     Medications:  Prior to Admission medications    Medication Sig Start Date End Date Taking? Authorizing Provider   hydroxychloroquine (PLAQUENIL) 200 MG tablet TAKE 1 TABLET TWICE DAILY 01/26/04   Provider, Conversion  Allergies:   Allergies   Allergen Reactions    Sulfamethoxazole-Trimethoprim      Created by Conversion - 0;        Family History:  No family history on file.     Social Hx:   Social History     Socioeconomic History    Marital status: Married     Spouse name: Not on file    Number of children: Not on file    Years of education: Not on file    Highest  education level: Not on file   Occupational History    Not on file   Tobacco Use    Smoking status: Not on file   Substance and Sexual Activity    Alcohol use: Not on file    Drug use: Not on file    Sexual activity: Not on file   Social History Narrative    Not on file            Objective  Objective   Vital Signs: BP (!) 174/108 (BP Location: Left arm)    Pulse (!) 129    Temp 36.5 C (97.7 F) (Temporal)    Resp (!) 32    Ht 1.524 m (5')    Wt 113.4 kg (250 lb)    SpO2 100%    BMI 48.83 kg/m      Physical Exam  Constitutional:       General: She is not in acute distress.     Appearance: Normal appearance. She is not ill-appearing.   HENT:      Head: Normocephalic and atraumatic.   Neck:      Comments: JVP not elevated.  Cardiovascular:      Rate and Rhythm: Tachycardia present. Rhythm irregular.      Heart sounds: No murmur heard.   No friction rub. No gallop.    Pulmonary:      Effort: Pulmonary effort is normal.      Breath sounds: No wheezing, rhonchi or rales.      Comments: Mildly SOB with talking.  Abdominal:      General: Abdomen is flat. Bowel sounds are normal. There is no distension.      Palpations: Abdomen is soft.      Tenderness: There is no abdominal tenderness. There is no guarding.   Musculoskeletal:      Right lower leg: No edema.      Left lower leg: No edema.   Skin:     General: Skin is warm.      Findings: No bruising, erythema or lesion.   Neurological:      General: No focal deficit present.      Mental Status: She is alert and oriented to person, place, and time.              Assessment:   Jodi Holmes is a 44 y.o. female with PMH significant for lupus c/b pulmonary fibrosis, afib s/p ablation, HTN, HLD, CHF, and DMT2 who presents for dyspnea. Workup notable for AFRVR and hypoxia requiring 6LNC O2. CT negative for PE and is notable for extensive GGO, mild pulmonary edema, and pericardial effusion. Overall, the patient does not appear overtly fluid overloaded. Her pericardial  effusion does not appear to be playing a role in her symptoms. Her CT findings are nonspecific, but would be concerning for lupus/ILD flare vs infectious etiology given hx of immunosuppression. DAH is also on the differential. Agree with high dose steroids, infectious workup, and optimization of  afib.    Recommendations:  - No indication for ICU admission at this time. If oxygen requirements worsen, would reach out to MICU again.  - Agree with Pulmonology consult  - Consider high dose IV steroids  - Rest of excellent care per hospital medicine team.    Please page the MICU Resident on call if additional questions or concerns arise.    Plan discussed with MICU fellow.    Confirmed FULL CODE status    Ila Mcgill, MD   Pulmonary/Critical Care Medicine

## 2020-01-12 NOTE — ED Notes (Signed)
Pt moved to 45R after RN personally cleaned the room. New hospital bed given to Pt. Pt ambulated to next room. Feels much more comfortable, and is able to rest.

## 2020-01-12 NOTE — ED Procedure Documentation (Addendum)
Procedures   Ultrasound - Cardiac  Performed by: Dena Billet, MD  Authorized by: Joanna Puff, MD       Procedure Details:     Indications: chest pain      Assessment for: pericardial fluid    Structures:     pericardium: visualized      cardiac chambers: visualized      ventricular septum: visualized      thoracic aorta: visualized      Views obtained: subxyphoid (SX), parasternal long axis (PSL), apical 4 chamber (A4C) and parasternal short axis (PSS)     Exam Limitations:   body habitus (very poor windows, very limited ultrasound)    Findings:     Pericardial fluid: yes      Size:  Moderate    Views: subxyphoid (SX) and parasternal long axis (PSL)      Tamponade physiology: no indications of tamponade       Impression:     pericardial fluid: pericardial fluid       Images were interpreted by me and archived to [facility] PACS.          Dena Billet, MD     Dena Billet, MD  Resident  01/12/20 1304  Ultrasound Procedure: I supervised the procedure, and was immediately available during the procedure.    Images: Images were reviewed and interpreted by me and I agree with the resident interpretation as documented.    Joanna Puff, MD as of 01/12/2020 at 2:20 PM     Joanna Puff, MD  01/12/20 1420

## 2020-01-12 NOTE — ED Notes (Signed)
Bed: AC-46L  Expected date:   Expected time:   Means of arrival:   Comments:  22L

## 2020-01-12 NOTE — Progress Notes (Signed)
Vancomycin (initial dosing) - Pharmacist to Dose    Jodi Holmes is a 44 y.o. female who has been consulted for vancomycin dosing and is being treated for Empiric Therapy     Goal trough concentration is 15-20 mcg/mL.    SCr:       Lab results: 01/12/20  0922   Creatinine 0.70     BUN:       Lab results: 01/12/20  0922   UN 11       Estimated CrCl: 110 ml/min    Will start patient at a dose of 2000 mg Q12H. Next vancomycin concentration before the 4th or 5th dose, order has not been placed at this time.   Pharmacist will follow for changes in renal function, toxicity, and efficacy and order serum concentrations and creatinine as needed.    For questions contact [phone].    Tresa Res, PharmD

## 2020-01-12 NOTE — Telephone (Signed)
With patient's permission, spoke with patient's husband Nurse, mental health). He reports that she has had a few episodes of SOB/cough each year, with the most recent one occurring in the winter of 2020-2021. He does not have additional medical records with him in [place], but will see whether there is anything that he can print out from MyChart. Answered questions about patient management and status.     Amalia Hailey  Medical Student, Year 3

## 2020-01-12 NOTE — ED Notes (Signed)
Pt on 6L NC with good pleth - O2 saturation maintained at 91-92%. Productive, strong cough.

## 2020-01-12 NOTE — ED Notes (Signed)
Bedside commode set up at bedside.  

## 2020-01-12 NOTE — ED Notes (Signed)
Pt extremely frustrated with condition of room. States the tray table in her room was dirty. When confronting different RN who answered the call bell about concern, Pt seems hostile and angry, wishes to get moved immediately. Writer went to try and fix the situation, but Pt unwilling. States "The other nurse was rude and disrespectful. Wouldn't even help me out. I dont wish to get any more care in this area its filthy." Pt reassured that room was clean, and would be willing to ask for room to be cleaned by environmental. Unhappy about hygiene of roommate as well. Very friendly and easygoing on arrival to unit.

## 2020-01-12 NOTE — ED Notes (Signed)
ED RN INTERN ATTESTATION       I Max Fickle, RN (RN) reviewed the following charting information by the RN intern: Jodi Graves, RN     Nursing Assessments  Medications  Plan of Care  Teaching   Notes    In the chart of Jodi Holmes (44 y.o. female) and attest to the charting being accurate.

## 2020-01-12 NOTE — Provider Consult (Addendum)
Pulmonary Attending Consultation Note      I was asked by the ED to see Jodi Holmes on  01/12/2020 for evaluation of  hypoxia.      Jodi Holmes is a 44 y.o. female with a history of lupus that has been managed in [facility].  She was initially diagnosed many years ago in [place].  She believes this was on the basis of a kidney biopsy.  She tells me that her lupus has been relatively stable for many years until 2018 when she developed lupus flares.  Around that time she had a pericardial effusion and an atrial fibrillation ablation.  She also developed some mild skin rashes and joint aches pains and swelling.  She was last treated with prednisone in October 2020.  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  Since 2019 she has been treated with azathioprine 50 mg twice daily and hydroxychloroquine 200 mg twice daily.  She denies any problems with recurrent infections.  She denies any side effects from these medications.  She denies prior lung inflammation.  She has no current rash.  She has dry skin in between her fingers  She has mild joint pains in her hands wrists ankles and elbows.  There is no definitive erythema  She has no oral lesions.  She denies any recent problems with her kidneys.  She denies blood in her urine.  She denies problems with blood clots.  She recently traveled to [place] from [facility] to visit her mother.  She drove and her trip was uneventful.  Over the past 2 weeks she developed a progressive cough and dyspnea.  She has rare sputum that is occasionally blood-tinged.  She denies fevers, chills, or night sweats.  She denies having any sick contacts.  She did lose many family members to Covid but none of them have been recently ill.  She is also noticed an increase in her heart rate, she denies orthopnea or PND.      Past Medical History:  History  reviewed. No pertinent past medical history.      Past Surgical History:  History reviewed. No pertinent surgical history.      Social History:   Occupation: She previously worked in [employer].  Occupational exposures: None  Tobacco: None  Alcohol intake: She did have several alcoholic beverages with past several days but denies chronic use.  Recreational Drug Use: She denies illicit substance use.  Pets: None  Environmental Exposures: None  Travel: Recent travel from [place]    Family History  No family history on file.      Immunizations    There is no immunization history on file for this patient.      A complete medication list was reviewed and updated with the patient.      Allergies:   Allergies   Allergen Reactions    Sulfamethoxazole-Trimethoprim      Created by Conversion - 0;          Blood pressure (S) (!) 154/114, pulse (!) 127, temperature 36.3 C (97.3 F), temperature source Temporal, resp. rate 16, height 1.524 m (5'), weight 113.4 kg (250 lb), SpO2 98 %.on 6 LPM    Gen:  Well appearing, No distress, dyspneic with conversation,no cyanosis,   HEENT: Anicteric, Oropharynx clear without lesion or thrush  Neck:  No cervical or supraclavicular lymphadenopathy. No thyromegaly.   Resp:  Diminished breath sounds bilaterally with very few scattered rales  CV:    Irregularly irregular and tachycardic.  mild peripheral edema  GI:  NABS, abd. Soft  Msk: no cyanosis, no clubbing  No joint tenderness, swelling or erythema  Neuro: Alert and oriented X 3 without focal findings  SKIN: No rashes; dry skin between fingers    Imaging:  ECHO  Technically difficult study due to body habitus / poor acoustic windows   Concentric LVH with abnormal diastolic function and left atrial enlargement.  Normal LVEF without significant wall motion abnormalities. Aortic valve sclerosis with estimated moderate stenosis  Right heart enlargement/dysfunction with estimated moderately elevated pulmonary artery systolic  pressure  Small circumferential pericardial effusion without frank tamponade  Mildly dilated ascending aorta.     CT chest   Sub-optimal examination for segmental and subsegmental pulmonary embolism due to respiratory motion artifact.   No evidence of pulmonary embolism.      Extensive bilateral patchy and groundglass lung opacification, likely multifocal infection/inflammation with underlying mild edema.      Labs reviewed  Sub-optimal examination for segmental and subsegmental pulmonary embolism due to respiratory motion artifact.   No evidence of pulmonary embolism.      Extensive bilateral patchy and groundglass lung opacification, likely multifocal infection/inflammation with underlying mild edema.      Results for Jodi Holmes, Jodi Holmes (MRN [id number]) as of 01/12/2020 16:41   Ref. Range 01/12/2020 09:22   Sodium Latest Ref Range: 133 - 145 mmol/L 141   Potassium Latest Ref Range: 3.3 - 5.1 mmol/L 3.3   Chloride Latest Ref Range: 96 - 108 mmol/L 100   CO2 Latest Ref Range: 20 - 28 mmol/L 26   Anion Gap Latest Ref Range: 7 - 16  15   UN Latest Ref Range: 6 - 20 mg/dL 11   Creatinine Latest Ref Range: 0.51 - 0.95 mg/dL 0.70   GFR,Black Latest Units: * 122   GFR,Caucasian Latest Units: * 105   Glucose Latest Ref Range: 60 - 99 mg/dL 118 (H)   Calcium Latest Ref Range: 8.8 - 10.2 mg/dL 9.0   Phosphorus Latest Ref Range: 2.7 - 4.5 mg/dL 3.6   Magnesium Latest Ref Range: 1.6 - 2.5 mg/dL 1.5 (L)   Total Protein Latest Ref Range: 6.3 - 7.7 g/dL 7.4   Albumin Latest Ref Range: 3.5 - 5.2 g/dL 3.7   Results for Jodi Holmes, Jodi Holmes (MRN [id number]) as of 01/12/2020 16:41   Ref. Range 01/12/2020 09:22   NT-pro BNP Latest Ref Range: 0 - 450 pg/mL 2,750 (H)     Results for Jodi Holmes, Jodi Holmes (MRN [id number]) as of 01/12/2020 16:41   Ref. Range 01/12/2020 09:22   D-Dimer Latest Ref Range: 0.00 - 0.50 ug/mL FEU 2.33 (H)   Results for Jodi Holmes, Jodi Holmes (MRN [id number]) as of 01/12/2020 16:41   Ref. Range 01/12/2020 09:22   WBC Latest Ref Range: 4.0 - 10.0 THOU/uL 9.3    RBC Latest Ref Range: 3.90 - 5.20 MIL/uL 4.3   Hemoglobin Latest Ref Range: 11.2 - 15.7 g/dL 10.6 (L)   Hematocrit Latest Ref Range: 34.00 - 45.00 % 34   MCV Latest Ref Range: 79.0 - 95.0 fL 80   MCH Latest Ref Range: 26 - 32 pg 25 (L)   MCHC Latest Ref Range: 32 - 36 g/dL 31 (L)   RDW Latest Ref Range: 11.7 - 14.4 % 15.3 (H)   Platelets Latest Ref Range: 160 - 370 THOU/uL 335     Results for Jodi Holmes, Jodi Holmes (  MRN [id number]) as of 01/12/2020 16:41   Ref. Range 01/12/2020 16:14   Sedimentation Rate Latest Ref Range: 0 - 20 mm/hr 89 (H)     Impression:  Jodi Holmes is a 44 y.o. female with a history of lupus who has developed progressive shortness of breath and tachycardia over the past 2 weeks after traveling to [place] from [facility].  Her echocardiogram shows a mild pericardial effusion and suggests a moderate degree of pulmonary artery pressure elevation.  In addition her CT scan shows diffuse opacities which could be consistent with diffuse alveolar hemorrhage.  She is currently in rapid A. fib.  It is possible that her atrial fibrillation is precipitating some pulmonary edema particularly in the setting of some pulmonary hypertension.    She has baseline cardiac disease and is on Eliquis and Plavix for prior cardiovascular event.  Is no current indication she has an active ischemic event at this time.  It is possible that her CT imaging could simply represent pulmonary edema, however a limited bedside echo only showed moderate stenosis with no significant LV systolic dysfunction.  It is also possible that her lung diease and pericardial effusion are related to a lupus flare.  The it is reasonable to start with IV Solu-Medrol 60 mg IV twice daily in attempt to suppress inflammation.  Ideally we would like to perform a bronchoscopy given the fact she has been on chronic immunosuppression.  However current oxygen requirements are at the upper limits of what we would consider safe unless performed  in an ICU setting.  It is possible that she has an opportunistic infection although this is less likely given the lack of other infectious symptoms.  It would be reasonable to send a beta D glucan and serum Aspergillus antigen to ensure no fungal infections as part of the initial work-up    Of note, Esbriet is technically not approved for the treatment of lupus associated interstitial lung disease.  I do not think that Esbriet is the source of her lung problems.  However, Esbriet should be held pending further evaluation.    Recommendations:  Hold eliquis  Would request both rheumatology and cardiology evaluate her.  Await serum tests as noted in her E chart.  Would send a beta D glucan and Aspergillus antigen as above  Start Solu-Medrol 60 mg IV twice daily pending further input from rheumatology.  Should she not improve on IV steroids, we can always consider a bronchoscopy in a stepdown location and/or pulse dose steroids.      Sincerely,   R. Torrie Mayers, MD, MS-CTR  Associate Professor of Medicine  Director of the Pulmonary Fibrosis [facility] at the [facility]  4:29 PM  01/12/2020

## 2020-01-12 NOTE — ED Notes (Signed)
Report Given To  Joni Reining, RN      Descriptive Sentence / Reason for Admission   Pt arrived to ED with complaint of SOB and tachycardia into the 140's. Endorsing cough and dyspnea.       Active Issues / Relevant Events   -A&Ox4  -Full code  -CT neg PE  -Lung Xray shows extensive bilateral lung opacities, suspicious for pulmonary edema   -New onset of CHF   -Currenty 5L NC baseline is Room air  -Tele       To Do List  -BG ACHS   -Meds per mar  -tele monitor HR  -VS/A Q4      Anticipatory Guidance / Discharge Planning  Admit for onset of CHF and Pulmonology work up

## 2020-01-12 NOTE — Provider Consult (Addendum)
Rheumatology Consult H&P  01/12/2020  Admit date: 01/12/2020    Consult requested by: Dr Cristal Generous  Consult Attending: Dr Marvel Plan  Clinical question: concern for [facility] and management     HPI: History obtained from patient interview and review of records    Jodi Holmes is a 44 y.o. female with a history of lupus that was diagnosed in 42 in [place] and being managed in [place], [place] by Dr Kathlene November. She is here visiting her mother is being admitted as she was having exertional dyspnea which is not new as she has had this in the past due to pulmonary fibrosis diagnosed in 04/2019 and was started on Esbriet in 06/2019. She was being managed on steroids which she was on for a year and discontinued 05/2019. Recent episodes are such that she has been having dyspnea at short distances walking to the bathroom. She also had orthopnea which she has had though it would improve after steroids. She has been having productive cough with clear phlegm and today she had small amount of blood tinged sputum today. She was given lasix today and feels her dyspnea has improved a little after receiving. She feels like fulness of her chest has also improved. Pulmonary was consulted as there was CT findings of ground glass opacities and concern for diffuse alveolar hemorrhage.  She states that when she was initially diagnosed when she was pregnant, and she had a rash on her back which a skin biopsy was concerning for lupus, Raynaud's in her fingers, arthralgias in her back and legs. She states to have a kidney biopsy in 2003 and was told to have a little bit of lupus in her kidney.  She has had lupus flares which are more frequent since 2018 with at least 2-3 times a year which she gets arthralgias, malar rash, and hair loss. She states to have had a pericardial effusion and afib ablation in the past. For her last lupus flare, she was given prednisone 4m which was in 02/2019. Her current medications for lupus are  azathioprine 529mBID and plaquenil 40019maily which she is tolerating well.   Review of systems she denies fever, chills, infections, rashes, chest pain, arthritis, nasal/oral ulcers, clots, pregnancy complications, easy bruising, abdominal pain, urinary changes hematuria or foamy urine, peripheral leg swelling. 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  She had dyspnea with prior pericardial effusion and denies any pleuritic chest pain. She was told to have degenerative disease in her back which she states to have had in the past and it is feeling like her usual back pain. She had an episode of chest pain while coughing which she felt like she pulled a muscle 4 days ago which resolved on its own lasting for hours. She has an area of patchy hair loss in her scalp which has not gotten worse.   In the ED she is on 6L of NS saturating in 97-100% and pulmonary was consulted for hypoxic respiratory failure. [facility] with [facility].     Review of Systems:   A 12 point review of systems was conducted and found to be negative except as stated in the HPI or below:    ALLERGIES:  Allergies   Allergen Reactions    Sulfamethoxazole-Trimethoprim      Created by Conversion - 0;        HOME MEDS:  Prior to  Admission medications    Medication Sig Start Date End Date Taking? Authorizing Provider    ALERT CONVERSION COMPLETE  Provider must reconcile medications 11/21/10   Provider, Conversion   hydroxychloroquine (PLAQUENIL) 200 MG tablet TAKE 1 TABLET TWICE DAILY 01/26/04   Provider, Conversion       INPATIENT MEDS:   metoprolol  50 mg Oral 2 times per day    piperacillin-tazobactam  4.5 g Intravenous Once    piperacillin-tazobactam  4.5 g Intravenous Q8H    vancomycin  2,000 mg Intravenous Q12H    insulin glargine  10 units Subcutaneous Nightly    insulin lispro  0-20 units Subcutaneous TID WC    [START ON 01/13/2020] colchicine   0.6 mg Oral Daily    rosuvastatin  20 mg Oral Daily    [START ON 01/13/2020] pantoprazole  40 mg Oral QAM    [START ON 01/13/2020] cetirizine  10 mg Oral Daily    methylPREDNISolone IV  60 mg Intravenous Q12H      Vancomycin - Pharmacist to Dose        sodium chloride  0-500 mL/hr Intravenous PRN    dextrose  0-500 mL/hr Intravenous PRN    sodium chloride  0-500 mL/hr Intravenous PRN    dextrose  0-500 mL/hr Intravenous PRN    acetaminophen  650 mg Oral Q4H PRN    perflutren protein A microsph  1.5 mL Intravenous PRN    dextrose  15 g Oral PRN    And    dextrose  25 g Intravenous PRN    And    glucagon  1 mg Intramuscular PRN    ipratropium-albuterol  3 mL Nebulization 4x Daily PRN         PMHx/PSHx:  History reviewed. No pertinent past medical history.    History reviewed. No pertinent surgical history.    FHx:  No family history on file.    SHx:  Social History     Socioeconomic History    Marital status: Married     Spouse name: Not on file    Number of children: Not on file    Years of education: Not on file    Highest education level: Not on file   Tobacco Use    Smoking status: Not on file   Substance and Sexual Activity    Alcohol use: Not on file    Drug use: Not on file    Sexual activity: Not on file   Other Topics Concern    Not on file   Social History Narrative    Not on file         PHYSICAL EXAM:  Vitals:  BP: (153-193)/(88-140)   Temp:  [36.1 C (97 F)-36.6 C (97.9 F)]   Temp src: Temporal (06/01 1643)  Heart Rate:  [120-130]   Resp:  [16-32]   SpO2:  [97 %-100 %]   Height:  [152.4 cm (5')]   Weight:  [113.4 kg (250 lb)]        General: well appearing, NC/AT  HEENT: no cervical, submandibular lymphadenopathy, parotid glands without swelling, area of patchy hair loss in middle of scalp  Heart: RRR, S1, S2 present, no murmurs or rubs heard  Lungs: b/l crackles,good air entry b/l  Abdomen: + BS heard, ND, NT to palpation  Extremities: no rash noted,   B/l shoulder adbuction,  rotations, and flexion are normal  B/l elbows with swelling and flexion and extension normal  B/l wrist flexion and extension normal  and MCP, PIP and DIP normal ROM without swelling   B/l knee without crepitus, no effusion and flexion normal  B/l ankle and midfoot without swelling and nontender to palpation, plantar and dorsiflexion, eversion and inversion normal.     LABORATORY VALUES:  Recent Labs (72hrs):  Recent Labs     01/12/20  1614 01/12/20  0922   WBC  --  9.3   Hemoglobin 12.7 10.6*   Hematocrit  --  34   MCV  --  80   Platelets  --  335   Sedimentation Rate 89*  --      Recent Labs     01/12/20  0922   Sodium 141   Potassium 3.3   CO2 26   Chloride 100   UN 11   Creatinine 0.70   Glucose 118*   Calcium 9.0   Magnesium 1.5*   AST 28   ALT 17   Alk Phos 89   Total Protein 7.4   Bilirubin,Total 0.3     No components found with this basename: PHOS    No results for input(s): UTPR, CREAU in the last 72 hours.    Rheum labs:  No results found for: ANAS, ANAT, ANAP, DNAN, SSA, SSB, ANRNP, ANSM, C3, C4, RIBOS, HSTO, SCL70, CENTB, JO1AB, CCP, B2IGG, B2IGM, ACLIG, ACLIM, LUPR, ANCAS, ANCAT, ANCAP, MPO, PR3, GBMAB, ASOS, ASOT, CK     Micro:         Imaging:  CT angio chest    Result Date: 01/12/2020  Sub-optimal examination for segmental and subsegmental pulmonary embolism due to respiratory motion artifact. No evidence of pulmonary embolism. Extensive bilateral patchy and groundglass lung opacification, likely multifocal infection/inflammation with underlying mild edema. Follow-up chest imaging should be performed as clinically warranted. Moderate to large pericardial effusion. Cardiomegaly. Mild pulmonary artery enlargement. Reflux of IV contrast into the IVC and hepatic veins, suggestive of elevated right heart pressures. Aortic valve calcification. Moderate left-sided coronary artery calcifications. These findings are advanced for patient's age. Clinical correlation is recommended. Multiple borderline sized to  mildly enlarged mediastinal and hilar lymph nodes, suspected to be reactive. END REPORT [facility] submits this DICOM format image data and final report to the [facility], an independent secure electronic health information exchange, on a reciprocally searchable basis (with patient authorization) for a minimum of 12 months after exam date.    *Chest standard frontal and lateral views    Result Date: 01/12/2020  Extensive bilateral lung opacities, suspicious for pulmonary edema given cardiac silhouette enlargement. Superimposed infection cannot be excluded in the appropriate clinical context. Underlying fibrotic lung disease cannot be excluded given the clinical  history. Cardiac silhouette enlargement, could be due to cardiomegaly, however findings are concerning for pericardial effusion. Clinical/echocardiographic correlation is recommended. END OF IMPRESSION [facility] submits this DICOM format image data and final report to the [facility], an independent secure electronic health information exchange, on a reciprocally searchable basis (with patient authorization) for a minimum of 12 months after exam date.      IMPRESSION: Jodi Holmes is a 44 y.o. female with  a history of lupus that was diagnosed in 1999 presenting with increased dyspnea with noted patchy bilateral ground glass opacities with acute hypoxic respiratory failure concerning for alveolar hemorrhage given history of lupus. Differentials are broad of alveolar hemorrhage and included vasculitis, lupus, Goodpasture's , pauci-immune capillaritis, and antiphospholipid syndrome. Presumed diagnosis of lupus and we would like to complete workup for causes for this and suggest studies  as below. She has a H/H today and unknown baseline levels as she is new to the system. We would recommend continuing to steroids and monitoring hemoptysis and oxygen requirements before adjusting the dose. Additionally, given the small amount and one episode of blood  tinged sputum with coughing, possible that her presentation of acute hypoxic respiratory due to heart failure which dyspnea has improved with diuresis.  She notes that she has had  dyspnea feeling similar to pericardial effusion though unclear if this presentation has been a part of her flares.  We will discuss with her rheumatologist about her presentation of her flares and baseline levels of antibodies and complements.     RECOMMENDATIONS:   Followup labs with ANA, dsDNA, anti-Ro/La, antiSmith/RNP antibodies, ANCA, PR-3, MPO antibodies, ESR, CRP, RF, anti-GBM antibodies, complements, antiphospholipid antibodies   Agree with solumedrol 109m BID and diuresis  Followup pulmonary recs   Continue with plaquenil and azathioprine   We will continue to follow      AFrederich Balding MD  Rheumatology Fellow  [facility] 01/12/2020 5:02 PM    Thank you for this consult.   Patient seen and discussed with my attending, Dr.Palma  Please page if any questions or concerns.

## 2020-01-12 NOTE — Student Note (Signed)
Hospital Medicine Admission Note      Code status: Full Code    CC: "I've been coughing for two days," per patient.     HPI:  Chart reviewed. History obtained from patient.    Jodi Holmes is 44 y.o. female admitted to hospital medicine with a history of acute SOB and cough. She lives in [place] and is currently in town visiting her family. Patient has exertional SOB when going up stairs. She reports that she began developing a cough approximately five days ago. Yesterday, she began having significant shortness of breath and an increasingly productive, persistent cough (clear phlegm). No hemoptysis. No associated chest pain; she reports "rib pain" upon deep coughing. No palpitations. Slight orthopnea noted. No lightheadedness, dizziness, or loss of consciousness. She was unable to sleep overnight due to the cough and reported to [facility] ED this morning.     In the ED, patient was placed on 6 LPM NC with SaO2 in the low-mid 90%. Administered methylprednisone, furosemide, glucocorticoid inhaler, and 1 L NS IVF. Persistent asymptomatic atrial fibrillation with RVR noted on telemetry. She reports continued difficulty breathing with an intermittent, non-productive cough occurring every few minutes.     PMH:   -HTN  -HLD  -CHF  -Atrial fibrillation (s/p ablation in 2018).   -DM2  -SLE (c/b IPF). Diagnosed in her early-20s; periodic lupus flares that are associated with arthralgia and "trouble breathing." IPF diagnosed in fall 2019/2020.     PSH:  -Ablation for atrial fibrillation (unclear date).     SH:   -Never smoker.  -No chronic alcohol use. She states that she hadn't had a drink in two years but had four glasses of wine on Memorial Day.  -No recreational drug use. No IVDU.     FH:   No family history on file.     Allergies:   Allergies as of 01/12/2020 - Up to Date 01/12/2020   Allergen Reaction Noted    Sulfamethoxazole-trimethoprim  01/26/2004      ROS:    Gen: No fevers/chills.  HEENT: No sore  throat.  Cardiac: no CP, palpitations  Pulm: no SOB, cough, or wheezing  Abd: No nausea/vomiting, diarrhea, blood in stool, or abdominal pain  GU: No hematuria, dysuria.  MSK: No joint pain, myalgias.  Skin: No recent rashes.  Hematology: No bruising.  Neuro: No headache, focal weakness.    Home Meds:   -Esbriet 801 mg TID  -Rosuvastatin 20 mg once daily.  -Apixaban 5 mg twice daily.  -Alubterol inhaler PRN.  -Metoprolol 50 mg BID.  -Dulaglutide 1.8 mg once daily.  -Furosemide 40 mg BID.  -Colchicine 0.6 mg once daily.  -Clopidogrel 75 mg once daily.  -Losartan 50 mg once daily.  -Lantus 20 units nightly; Lispro with meals 10 units.  -Hydroxychloroquine 200 mg BID.  -Pantoprazole 40 mg once daily.  -Diltiazem 240 mg once daily.  -Tramadol 50 mg PRN once nightly.  -Azathioprine 50 mg twice daily.     Physical Exam:     Patient Vitals for the past 8 hrs:   BP Temp Temp src Pulse Resp SpO2 Height Weight   01/12/20 1332 (!) 154/114 36.3 C (97.3 F) TEMPORAL (!) 127 16 98 % 1.524 m (5') 113.4 kg (250 lb)   01/12/20 1007 (!) 153/93          01/12/20 1003 (!) 193/140 36.1 C (97 F) TEMPORAL (!) 130 (!) 28 97 %     01/12/20 0800 153/88 36.6 C (97.9 F)  (!)  120 22 98 % 1.524 m (5') 113.4 kg (250 lb)     Wt Readings from Last 2 Encounters:   01/12/20 113.4 kg (250 lb)   03/20/05 97.4 kg (214 lb 11.7 oz)     General: In acute distress, ill-appearing. SOB with conversation.   Skin: Warm and dry. No rashes.  HEENT: Normocephalic and atraumatic. Clear oropharynx.   Heart: Irregular rate and rhythm. S1 and S2 with no murmurs, rubs, or gallops.   Lungs: Normal respiratory effort. CTAB  Abdomen: Normoactive bowel sounds. Abdomen soft, non-tender and non-distended. No masses, fluid wave or shifting dullness.   Rectal: Deferred.  GU: Deferred.  Musculoskeletal: No red, swollen, or tender joints.  Vascular: Warm extremities.  Lymphatic: No cervical lymphadenopathy.  Neurologic: A&O x3. No focal deficits.     Labs:      Labs reviewed and notable for:    Recent Labs   Lab 01/12/20  0922   Sodium 141   Potassium 3.3   Chloride 100   CO2 26   UN 11   Creatinine 0.70   Glucose 118*   Calcium 9.0     No components found with this basename: PHOS, PREALB   No results for input(s): INR, PTT, PTI in the last 168 hours.    No components found with this basename: APTT   Recent Labs   Lab 01/12/20  0922   Bilirubin,Total 0.3   Bilirubin,Direct <0.2   AST 28   ALT 17   Alk Phos 89   Albumin 3.7    Recent Labs   Lab 01/12/20  0922   WBC 9.3   Hemoglobin 10.6*   Hematocrit 34   Platelets 335        EKG/tele:  -Telemetry: periodic SVT    IMAGING:  CT angio chest    Result Date: 01/12/2020  Sub-optimal examination for segmental and subsegmental pulmonary embolism due to respiratory motion artifact. No evidence of pulmonary embolism. Extensive bilateral patchy and groundglass lung opacification, likely multifocal infection/inflammation with underlying mild edema. Follow-up chest imaging should be performed as clinically warranted. Moderate to large pericardial effusion. Cardiomegaly. Mild pulmonary artery enlargement. Reflux of IV contrast into the IVC and hepatic veins, suggestive of elevated right heart pressures. Aortic valve calcification. Moderate left-sided coronary artery calcifications. These findings are advanced for patient's age. Clinical correlation is recommended. Multiple borderline sized to mildly enlarged mediastinal and hilar lymph nodes, suspected to be reactive. END REPORT [facility] submits this DICOM format image data and final report to the [facility], an independent secure electronic health information exchange, on a reciprocally searchable basis (with patient authorization) for a minimum of 12 months after exam date.    *Chest standard frontal and lateral views    Result Date: 01/12/2020  Extensive bilateral lung opacities, suspicious for pulmonary edema given cardiac silhouette enlargement. Superimposed infection cannot  be excluded in the appropriate clinical context. Underlying fibrotic lung disease cannot be excluded given the clinical  history. Cardiac silhouette enlargement, could be due to cardiomegaly, however findings are concerning for pericardial effusion. Clinical/echocardiographic correlation is recommended. END OF IMPRESSION [facility] submits this DICOM format image data and final report to the [facility], an independent secure electronic health information exchange, on a reciprocally searchable basis (with patient authorization) for a minimum of 12 months after exam date.    Assessment/Plan:  Jodi Holmes is a 44 y.o. female, history significant for HTN, HLD, DM2, SLE c/b IPF, atrial fibrillation s/p ablation, and CHF, admitted  with acute onset shortness of breath and cough. Work-up notable for tachycardia, tachypnea, non-productive cough, CT chest showing pericardial effusion and diffuse, ground-glass opacifications, and CXR showing cardiomegaly. Presentation is consistent with hypoxic respiratory failure of unknown etiology.     Possible etiologies include cardiogenic causes, such as CHF exacerbation secondary to infection, atrial fibrillation, or pericardial effusion. Non-cardiogenic causes include pulmonary embolism, SLE complications, such as IPF flare or DAH, and stand-alone atypical infection/inflammation. CTA is reassuring in ruling out PE but is suggestive of inflammatory/infectious etiology. Patient meets SIRS criteria (tachycardia and tachypnea) but infection is less likely with no leukocytosis or fever. Ground-glass lung appearance on CT imaging is suggestive of parenchymal inflammation, particularly in setting of preexisting SLE and IPF. Diffuse alveolar hemorrhage secondary to SLE is a concerning prospect, but is less likely given lack of presenting hemoptysis. Further diagnostic work-up is necessary to determine the role that these autoimmune conditions may play.     Cardiogenic dysfunction,  whether as an exacerbation of pre-existing CHF or pericardial effusion expansion, may be contributory in patient's symptomatic presentation. There are no acute infectious triggers that may have exacerbated her CHF. It is noteworthy that, yesterday she consumed a large amount of alcohol, which is out of her normal habits. This may have predisposed her to persistent atrial fibrillation that has led to her respiratory failure. Without recent imaging of the pericardial effusion, it is unclear whether there has been any interval change. It is certainly possible that the effusion may have expanded secondary to an occult cardiac infection, leading to presenting symptoms.     Patient's presentation is likely multifactorial and requires a thorough, multipronged diagnostic evaluation.     1) Acute hypoxic respiratory failure (unknown etiology; likely inflammatory ISO CHF/AF)  -Currently dyspneic with 6 LPM NC O2 requirement. Goal to wean oxygen with SaO2 > 88%.    -Vancomycin 2000 mg IV q12 and zosyn 4.5 g IV q8 for empiric antibiotic coverage.   -Pending blood cultures and sputum cultures.  -Pending ESR, CRP, ANA, C3/C4, dsDNA to evaluate for SLE flare.  -Pending LDH to evaluate for PCP.  -Pending UA, urine legionella, and S. pneumo antigens.   -Pending lactate.   -Appreciate pulmonology recommendations.   -BMP and CBC once daily.   -VS q4.     2) Pericardial effusion (possible acute on chronic)  -Pending transthoracic echocardiogram.   -Colchicine 0.6 mg PO once daily.   -Hold furosemide 40 mg PO twice daily. Pending echocardiography.  -BMP once daily.     3) Atrial Fibrillation with RVR  -Pending transthoracic echocardiogram.  -Metoprolol tartrate 50 mg PO twice daily.  -Hold diltiazem 240 mg PO once daily.   -Hold apixaban 5 mg PO twice daily.   -Continuous telemetry.   -VS q4.     4) SLE c/b IPF  -Hold home azathioprine 50 mg PO BID and hydroxychloroquine 200 mg PO BID.   -Hold home Esbriet.     5) HTN  -Hold home  losartan 50 mg PO once daily.    6) HLD  -Hold home rosuvastatin 20 mg PO once daily.     F: PO  E: N/A  N: Consistent carbohydrate diet.   Ppx: Pantoprazole 40 mg PO once daily.   Med Rec: Completed with patient.  Discharge Plan: Pending diagnostic work-up and clinical improvement.     Signed by Alan Ripper 01/12/2020 at 3:26 PM  PIC #[id number] , please page with any questions

## 2020-01-12 NOTE — ED Provider Notes (Addendum)
History     Chief Complaint   Patient presents with    Shortness of Breath    Palpitations     44 y.o. female with relevant medical history of hypertension and lupus complicated by pulmonary fibrosis presenting with dyspnea for approximately 12 hours.  She first noticed her symptoms yesterday evening.  This feels to her like previous pulmonary fibrosis flares which have previously improved with nebulizer treatment and steroids.  She takes diltiazem and took her dose of this prior to EMS arrival.  EMS noted her heart rate to initially be in the 180s although this improved to 120 by the time of arrival.  She reports isolated dyspnea and cough which is typical for her previous lupus flares.  No identified trigger although this did occur after her first excursion out of the house since the COVID-19 pandemic outbreak.  No fever.  No rhinorrhea.  No congestion.  No pain, including chest pain.  No nausea or vomiting.  She is tolerating oral intake well.  No difficulty voiding.  No extremity swelling.  No lightheadedness or syncope.  No other complaints.          Medical/Surgical/Family History     History reviewed. No pertinent past medical history.     Patient Active Problem List   Diagnosis Code    Essential Hypertension I10    Systemic Lupus Erythematosus M32.9    Iron Deficiency D50.9            History reviewed. No pertinent surgical history.  No family history on file.       Social History     Tobacco Use    Smoking status: Not on file   Substance Use Topics    Alcohol use: Not on file    Drug use: Not on file     Living Situation     Questions Responses    Patient lives with     Homeless     Caregiver for other family member     External Services     Employment     Domestic Violence Risk                 Review of Systems   Review of Systems   Constitutional: Negative for fever.   HENT: Negative for congestion.    Eyes: Negative for pain.   Respiratory: Positive for cough and shortness of breath.     Cardiovascular: Negative for chest pain.   Gastrointestinal: Negative for abdominal pain.   Genitourinary: Negative for flank pain.   Musculoskeletal: Negative for back pain.   Neurological: Negative for headaches.   Psychiatric/Behavioral: Negative for confusion.       Physical Exam     Triage Vitals  Triage Start: Start, (01/12/20 0757)   First Recorded BP: 153/88, Resp: 22, Temp: 36.6 C (97.9 F) Oxygen Therapy SpO2: 98 %, O2 Device: O2 Therapy, O2 Therapy: Nasal cannula, O2 Flow Rate: 4 L/min, Heart Rate: (!) 120, (01/12/20 0800)  .  First Pain Reported  0-10 Scale: 0, (01/12/20 0800)       Physical Exam  Vitals and nursing note reviewed.   Constitutional:       General: She is not in acute distress.     Appearance: She is well-developed. She is obese. She is not diaphoretic.   HENT:      Head: Normocephalic and atraumatic.      Right Ear: External ear normal.      Left Ear: External ear  normal.   Eyes:      General: No scleral icterus.     Conjunctiva/sclera: Conjunctivae normal.   Cardiovascular:      Rate and Rhythm: Regular rhythm. Tachycardia present.      Heart sounds: Normal heart sounds. No murmur heard.   No friction rub. No gallop.    Pulmonary:      Effort: Tachypnea and respiratory distress (mild) present.      Breath sounds: Normal breath sounds. No decreased breath sounds, wheezing, rhonchi or rales.   Chest:      Chest wall: No tenderness.   Abdominal:      General: There is no distension.      Palpations: Abdomen is soft. There is no mass.      Tenderness: There is no abdominal tenderness. There is no guarding or rebound.   Musculoskeletal:         General: Normal range of motion.      Cervical back: Normal range of motion and neck supple.      Right lower leg: No edema.      Left lower leg: No edema.   Skin:     General: Skin is warm and dry.      Findings: No erythema.   Neurological:      Mental Status: She is alert and oriented to person, place, and time.   Psychiatric:         Behavior:  Behavior normal.         Thought Content: Thought content normal.         Judgment: Judgment normal.         Medical Decision Making   Patient seen by me on:  01/12/2020    Assessment:  Tachycardic, mild respiratory distress.  Most likely, this is lupus exacerbation in the setting of pulmonary fibrosis.  Symptoms less consistent with pulmonary embolism especially as she is on apixaban.  Doubt infection, including COVID-19, bacterial pneumonia, other viral pneumonia.  ECG unremarkable save for tachycardia, no pain or other symptoms to suggest primary cardiac cause such as dysrhythmia or acute coronary syndrome, further work-up for these is not indicated.    Plan:    - IV, ECG, telemetry, pulse oximetry  - blood count, chemistry, D-dimer  - x-ray chest  - IV fluids  - albuterol-ipratropium nebulizers  - methylprednisolone  - coronavirus-19 swab  - reassess    EKG Interpretation: Sinus tachycardia, no ischemic changes    Independent review of: Existing labs, XRays, chart/prior records         ED Course as of Jan 11 1302   Tue Jan 12, 2020   1016 Ordering CT angiogram.   D-Dimer(!): 2.33   1017 Hemoglobin(!): 10.6   1017 WBC: 9.3   1017 Platelets: 335   1017 Unremarkable.   Basic metabolic panel(!)   6644 "Extensive bilateral lung opacities, suspicious for pulmonary edema given cardiac silhouette enlargement. Superimposed infection cannot be excluded in the appropriate clinical context. Underlying fibrotic lung disease cannot be excluded given the clinicalhistory.     Cardiac silhouette enlargement, could be due to cardiomegaly, however findings are concerning for pericardial effusion. linical/echocardiographic correlation is recommended."   *Chest standard frontal and lateral views   1025 Stopping fluids, treating with furosemide, adding BNP.      1035 Preg,Serum: NEG   1035 Desaturated, now on 6L nasal cannula, stabilized.      1048 Pericardial effusion on ultrasound. This is chronic per patient.  1048 Influenza A  PCR: .   1150 Potentially new congestive heart failure.   NT-pro BNP(!): 2,750   1150 COVID-19 PCR: NEG   1150 Influenza A PCR: .   1150 RSV PCR: .   1150 Influenza B PCR: .   1239 "Sub-optimal examination for segmental and subsegmental pulmonary embolism due to respiratory motion artifact. No evidence of pulmonary embolism.     Extensive bilateral patchy and groundglass lung opacification, likely multifocal infection/inflammation with underlying mild edema. Follow-up chest imaging should be performed as clinically warranted.     Moderate to large pericardial effusion.     Cardiomegaly. Mild pulmonary artery enlargement. Reflux of IV contrast into the IVC and hepatic veins, suggestive of elevated right heart pressures.     Aortic valve calcification. Moderate left-sided coronary artery calcifications. These findings are advanced for patient's age. Clinical correlation is recommended.     Multiple borderline sized to mildly enlarged mediastinal and hilar lymph nodes, suspected to be reactive."   CT angio chest   1255 Admitted to hospital medicine.          Dena Billet, MD      Resident Attestation:    Patient seen by me on 01/12/2020.    I saw and evaluated the patient. I agree with the resident's/fellow's findings and plan of care as documented above.  Author:  Joanna Puff, MD       Dena Billet, MD  Resident  01/12/20 1303       Joanna Puff, MD  01/12/20 1420

## 2020-01-12 NOTE — ED Triage Notes (Signed)
States last night developed shortness of breath and cough around 1900. Patient took her diltiazem about 30 minutes prior to EMS arrival. Noted her HR to be in the 180's initially. Down to 130s at triage. Denies chest pain. States shortness of breath has improved. EKG at triage.        Triage Note   Jobe Gibbon, RN

## 2020-01-12 NOTE — H&P (Signed)
HOSPITAL MEDICINE ADMISSION H&P NOTE     CC: Shortness of Breath    HPI:  Jodi Holmes is a 44 y.o. 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 Her worsening shortness of breath started two days ago. At baseline she often feel short of breath when climbing the stairs or doing chores around the house. Two days ago, however, she began feeling this more. She has had a cough productive of clear, non bloody sputum for the past few days, but it became worse in the last two days. She was unable to lie flat on her back last night, which combined with her cough did not allow her to get any sleep. She has been having some chest soreness, but no crushing chest pain, sharp chest pain, or abdominal pain.     Her medical history is notable for lupus with secondary pulmonary fibrosis. She receives all of her medical care in [place], [place] where she lives with her husband. She was recently started on Esbriet for pulmonary fibrosis with significant improvement in function.    In the ED she was given fluids, lasix, steroids, and nebulizer therapy, all without great effect. She was placed on nasal cannula, with fairly quick escalation to 6 L. On interview she has significantly increased work of breathing and easily gets short of breath with decreasing O2 sats when answering questions.    All of her most current medical records have now been updated within Care Everywhere. Per review of patient's MyChart, her home medications are as follows:  Esbriet 801 mg TID, crestor 20 mg daily, eliquis 5 mg BID, albuterol inhaler PRN for wheezing, metoprolol 50 mg BID, victoza 18 mg/3 mL (1.8 mg total) daily, lasix 40 mg BID, colchicine .6 mg daily, plavix 75 mg daily, losartan 50 mg daily, lantus 20 units nightly, lispro with meals NB 10 units, zyrtec 10 mg daily, plaquenil 200 mg  BID, protonix 40 mg daily, diltiazem 240 mg daily, flexeril 10 mg BID PRN, tramadol 50 mg nightly PRN, depo-provera, tylenol 500 mg Q4 PRN, azathioprine 50 mg BID, vitamin D supplementation.    Review of Systems:  General: Negative for fever, chills, change in appetite, change in weight, night sweats, malaise  HEENT: Negative for headache, sinus pain, sore throat  CV: Positive for chest pain described as soreness. Negative for palpitations  Pulm: Positive for cough productive of clear, non-bloody sputum, wheezing, shortness of breath  GI: Negative for abdominal pain, nausea, emesis, diarrhea, constipation  GU: Negative for dysuria, hematuria  Neuro: Negative for LOC, dizziness, numbness, weakness, tingling, gait instability  MSK: Negative for myalgias, arthralgias, trauma/injury  Endo: Negative for hot/cold intolerance, polydipsia/polyuria  Derm: Negative for rash, pruritis, change in skin tone  Heme: Negative for easy bruising, bleeding gums  Immune: Negative for allergies    Pertinent Social History:                             Never smoker, rare social drinker, most recently 4 glass of wine this weekend, no recreational or IV drug use. Works in Therapist, art. Lives in [place], [place], in [place] visiting family.    Pertinent Family History:  No pertinent family history.     Medications: Reviewed and updated.    Allergies: Reviewed and updated.  01/12/20 0800 01/12/20 1003 01/12/20 1007   BP: 153/88 (!) 193/140 (!) 153/93   BP Location:  Right arm Right arm   Pulse: (!) 120 (!) 130    Resp: 22 (!) 28    Temp: 36.6 °C (97.9 °F) 36.1 °C (97 °F)    TempSrc:  Temporal    SpO2: 98% 97%    Weight: 113.4 kg (250 lb)     Height: 1.524 m (5')         Physical Exam  General Constitutional: Patient calm, pleasant, cooperative, but ill-appearing  HEENT: MMM, no ulcerations or erosions, PERRL, no LAD or thyromegaly  Cardiovascular: Tachycardia, irregular rhythm, normal S1/S2 no MRG, no obvious  JVD  Pulmonary: Increased work of breathing, diffuse crackles posteriorly  Gastrointestinal: active bowel sounds, non-tender to palpation, non-distended  Extremities: trace pitting edema bilaterally, brisk peripheral pulses  Neurologic: AOx3, no FND   Skin: no rashes    Laboratory Data: Reviewed and notable for:    BNP 2750  D-Dimer 2.33    Na 141  K 3.3  CO2 26  Cr 0.70    WBC 9.3  H/H 10.6/335    Microbiology: Reviewed and notable for:    COVID negative  Influenza A/B negative  RSV negative    Imaging: Reviewed and notable for:    CTA Chest:  Sub-optimal examination for segmental and subsegmental pulmonary embolism due to respiratory motion artifact.   No evidence of pulmonary embolism.      Extensive bilateral patchy and groundglass lung opacification, likely multifocal infection/inflammation with underlying mild edema. Follow-up chest imaging should be performed as clinically warranted.      Moderate to large pericardial effusion.      Cardiomegaly. Mild pulmonary artery enlargement. Reflux of IV contrast into the IVC and hepatic veins, suggestive of elevated right heart pressures.      Aortic valve calcification. Moderate left-sided coronary artery calcifications. These findings are advanced for patient's age. Clinical correlation is recommended.      Multiple borderline sized to mildly enlarged mediastinal and hilar lymph nodes, suspected to be reactive.     Chest X-Ray:  Extensive bilateral lung opacities, suspicious for pulmonary edema given cardiac silhouette enlargement. Superimposed infection cannot be excluded in the appropriate clinical context. Underlying fibrotic lung disease cannot be excluded given the clinical    history.      Cardiac silhouette enlargement, could be due to cardiomegaly, however findings are concerning for pericardial effusion. Clinical/echocardiographic correlation is recommended.     Assessment     Jodi Holmes is a 44 y.o. female with medical history significant for lupus,  pulmonary fibrosis, CHF, paroxysmal atrial fibrillation with RVR, CAD s/p LAD PCI, pericardial effusion, moderate aortic stenosis, diabetes mellitus, HTN, and HLD who presents in acute hypoxic respiratory failure. Etiology of respiratory failure unclear at this time. It is more likely pulmonary due to ground glass opacities on CT, relatively euvolemic exam, and history of both lupus and pulmonary fibrosis. Cardiogenic causes cannot be excluded at this time, however, due to elevated BNP, atrial fibrillation, and history of CHF.      Plan     Acute Hypoxic Respiratory Failure Etiology not clear at this time. Will pursue workup for cardiogenic, pulmonary, and infectious sources. Likely pulmonary etiology due to CT findings  - Chest x-ray and CTA as above, notable for significant GGOs, no PE  - s/p methylprednisolone 125, lasix 40, 1 L NS, and Duo-nebs in the ED  - Currently on 6 L NC, VBG   pending  - ANA, C3, C4, dsDNA to check for possible lupus flare  - ESR, CRP, procal to establish degree of inflammation  - Vanc, zosyn for empiric coverage of possible multi-focal pneumonia  - Blood cultures, sputum cultures pending  - LDH to rule out PJP  - UA with microscopy, urine legionella and strep pneumo antigens pending  - Pulmonology consult for workup of possible pulmonary fibrosis exacerbation  - Hold home Esbriet  - No hemoptysis, low suspicion for alveolar hemorrhage at this time    Pericardial Effusion chronic, no tamponade physiology on presentation so unlikely acutely enlarging  - ECHO pending  - Hold home lasix 40 mg BID, do not diurese  - Continue home colchicine 0.6 mg daily    Paroxysmal Atrial Fibrillation with RVR  - Home metoprolol 50 mg BID  - ECHO pending  - Hold home diltiazem 240 mg daily until ECHO confirms no new/worsening ischemic cardiomyopathy  - Metoprolol 5 mg IV PRN for sustain HR >140  - Home Eliquis 5 mg BID    Lupus with secondary Pulmonary Fibrosis unclear if in flare at this time  - ANA, C3, C4,  dsDNA to check for possible lupus flare  - ESR, CRP, procal to establish degree of inflammation  - Hold home plaquenil and azothioprine  - Hold home Esbriet    Diabetes mellitus Type 2  - Hal home lantus 10 units nightly  - Lispro SS, half home NB 5  - POCT BGs with meals and nightly  - Hold home Victoza    Chronic problems:  HTN: Hold home losartan 50 mg daily  HLD: Continue home Crestor 20 mg daily  GERD: Home Protonix 40 mg daily  CAD: Hold home plavix, recent cardiology note indicates stopping    Labs: CBC, BMP daily  Diet: Consistent Carbohydrate  DVT ppx: Eliquis  Bowel: Home Protonix  Sleep: N/A  Lines: pIV x2  Code Status: FULL CODE     , MD  Internal Medicine PGY-1  [id number]

## 2020-01-13 LAB — CBC AND DIFFERENTIAL
Baso # K/uL: 0 10*3/uL (ref 0.0–0.1)
Basophil %: 0 %
Eos # K/uL: 0 10*3/uL (ref 0.0–0.4)
Eosinophil %: 0 %
Hematocrit: 34 % (ref 34–45)
Hemoglobin: 11.4 g/dL (ref 11.2–15.7)
IMM Granulocytes #: 0.1 10*3/uL — ABNORMAL HIGH (ref 0.0–0.0)
IMM Granulocytes: 0.5 %
Lymph # K/uL: 0.4 10*3/uL — ABNORMAL LOW (ref 1.2–3.7)
Lymphocyte %: 4.5 %
MCH: 26 pg (ref 26–32)
MCHC: 33 g/dL (ref 32–36)
MCV: 79 fL (ref 79–95)
Mono # K/uL: 0.4 10*3/uL (ref 0.2–0.9)
Monocyte %: 3.8 %
Neut # K/uL: 8.8 10*3/uL — ABNORMAL HIGH (ref 1.6–6.1)
Nucl RBC # K/uL: 0 10*3/uL (ref 0.0–0.0)
Nucl RBC %: 0.3 /100 WBC — ABNORMAL HIGH (ref 0.0–0.2)
Platelets: 357 10*3/uL (ref 160–370)
RBC: 4.4 MIL/uL (ref 3.9–5.2)
RDW: 15.2 % — ABNORMAL HIGH (ref 11.7–14.4)
Seg Neut %: 91.2 %
WBC: 9.7 10*3/uL (ref 4.0–10.0)

## 2020-01-13 LAB — BASIC METABOLIC PANEL
Anion Gap: 16 (ref 7–16)
Anion Gap: 18 — ABNORMAL HIGH (ref 7–16)
CO2: 24 mmol/L (ref 20–28)
CO2: 22 mmol/L (ref 20–28)
Calcium: 8.7 mg/dL — ABNORMAL LOW (ref 8.8–10.2)
Calcium: 9 mg/dL (ref 8.8–10.2)
Chloride: 97 mmol/L (ref 96–108)
Chloride: 98 mmol/L (ref 96–108)
Creatinine: 0.77 mg/dL (ref 0.51–0.95)
Creatinine: 0.85 mg/dL (ref 0.51–0.95)
GFR,Black: 108 *
GFR,Black: 96 *
GFR,Caucasian: 94 *
GFR,Caucasian: 83 *
Glucose: 245 mg/dL — ABNORMAL HIGH (ref 60–99)
Glucose: 229 mg/dL — ABNORMAL HIGH (ref 60–99)
Lab: 17 mg/dL (ref 6–20)
Lab: 19 mg/dL (ref 6–20)
Potassium: 3.6 mmol/L (ref 3.3–5.1)
Potassium: 3.8 mmol/L (ref 3.3–5.1)
Sodium: 137 mmol/L (ref 133–145)
Sodium: 138 mmol/L (ref 133–145)

## 2020-01-13 LAB — ANA TITER/PATTERN
ANA Titer: 1280 — AB
ANA Titer: 1280 — AB

## 2020-01-13 LAB — AEROBIC CULTURE

## 2020-01-13 LAB — ANTI RNP/SMITH
Anti-RNP: 4.3 AI — ABNORMAL HIGH (ref 0.0–0.9)
Anti-Smith: <0.2 AI (ref 0.0–0.9)
SM/RNP AB: >8 AI — ABNORMAL HIGH (ref 0.0–0.9)

## 2020-01-13 LAB — ANTI-CARDIOLIPIN AB
Anticardiolipin IgG: <1.6 [GPL'U]/mL (ref 0.0–19.0)
Anticardiolipin IgM: 0.4 [MPL'U]/mL (ref 0.0–19.0)

## 2020-01-13 LAB — MRSA (ORSA) AMPLIFICATION: MRSA (ORSA) Amplification: 0

## 2020-01-13 LAB — ANTINUCLEAR ANTIBODY SCREEN
ANA Screen: POSITIVE — AB
ANA Screen: POSITIVE — AB

## 2020-01-13 LAB — BETA-2 GLYCOPROTEIN ANTIBODIES
Beta-2 Glyco 1 IgG: <1.4 U/mL (ref 0.0–19.0)
Beta-2 Glyco 1 IgM: 1.5 U/mL (ref 0.0–19.0)

## 2020-01-13 LAB — POCT GLUCOSE
Glucose POCT: 172 mg/dL — ABNORMAL HIGH (ref 60–99)
Glucose POCT: 221 mg/dL — ABNORMAL HIGH (ref 60–99)
Glucose POCT: 299 mg/dL — ABNORMAL HIGH (ref 60–99)
Glucose POCT: 255 mg/dL — ABNORMAL HIGH (ref 60–99)

## 2020-01-13 LAB — ANTI-SSA/SSB
Anti-LA/SS-B: <0.2 AI (ref 0.0–0.9)
Anti-RO/SS-A: 0.2 AI (ref 0.0–0.9)

## 2020-01-13 LAB — ANTI DS-DNA AB: dsDNA Ab: 1 IU/mL (ref 0–4)

## 2020-01-13 LAB — INTERPRETATION,SPEC COAG

## 2020-01-13 LAB — SPEC COAG REVIEW

## 2020-01-13 LAB — RHEUMATOID FACTOR,SCREEN: Rheumatoid Factor: <10 IU/mL

## 2020-01-13 LAB — REVIEWED BY:

## 2020-01-13 LAB — ANCA SCREEN: ANCA Screen: NEGATIVE

## 2020-01-13 MED ORDER — FUROSEMIDE 40 MG PO TABS *I*
40.0000 mg | ORAL_TABLET | Freq: Once | ORAL | Status: DC
Start: 2020-01-13 — End: 2020-01-13

## 2020-01-13 MED ORDER — FUROSEMIDE 10 MG/ML IJ SOLN *I*
20.0000 mg | Freq: Once | INTRAMUSCULAR | Status: AC
Start: 2020-01-13 — End: 2020-01-13
  Administered 2020-01-13: 20 mg via INTRAVENOUS
  Filled 2020-01-13: qty 4

## 2020-01-13 MED ORDER — FUROSEMIDE 40 MG PO TABS *I*
40.0000 mg | ORAL_TABLET | Freq: Every day | ORAL | Status: DC
Start: 2020-01-13 — End: 2020-01-14
  Administered 2020-01-13: 40 mg via ORAL
  Filled 2020-01-13 (×2): qty 1

## 2020-01-13 MED ORDER — DILTIAZEM HCL COATED BEADS 240 MG PO CP24 *I*
240.0000 mg | ORAL_CAPSULE | Freq: Every day | ORAL | Status: DC
Start: 2020-01-13 — End: 2020-01-17
  Administered 2020-01-13 – 2020-01-17 (×5): 240 mg via ORAL
  Filled 2020-01-13 (×5): qty 1

## 2020-01-13 MED ORDER — AZATHIOPRINE 50 MG PO TABS *I*
50.0000 mg | ORAL_TABLET | Freq: Two times a day (BID) | ORAL | Status: DC
Start: 2020-01-13 — End: 2020-01-17
  Administered 2020-01-13 – 2020-01-17 (×9): 50 mg via ORAL
  Filled 2020-01-13 (×10): qty 1

## 2020-01-13 MED ORDER — INSULIN GLARGINE 100 UNIT/ML SC SOLN *WRAPPED*
15.0000 [IU] | Freq: Every evening | SUBCUTANEOUS | Status: DC
Start: 2020-01-13 — End: 2020-01-15
  Administered 2020-01-13 – 2020-01-14 (×2): 15 [IU] via SUBCUTANEOUS

## 2020-01-13 MED ORDER — PHENOL 1.4 % MT LIQD *I*
2.0000 | OROMUCOSAL | Status: DC | PRN
Start: 2020-01-13 — End: 2020-01-17
  Filled 2020-01-13: qty 20

## 2020-01-13 MED ORDER — LOSARTAN POTASSIUM 50 MG PO TABS *I*
50.0000 mg | ORAL_TABLET | Freq: Every day | ORAL | Status: DC
Start: 2020-01-13 — End: 2020-01-14
  Administered 2020-01-13 – 2020-01-14 (×2): 50 mg via ORAL
  Filled 2020-01-13 (×2): qty 1

## 2020-01-13 MED ORDER — HYDROXYCHLOROQUINE SULFATE 200 MG PO TABS *I*
200.0000 mg | ORAL_TABLET | Freq: Two times a day (BID) | ORAL | Status: DC
Start: 2020-01-13 — End: 2020-01-17
  Administered 2020-01-13 – 2020-01-17 (×9): 200 mg via ORAL
  Filled 2020-01-13 (×10): qty 1

## 2020-01-13 NOTE — Progress Notes (Signed)
Hospital Medicine Progress Note   LOS: 1 day  Full Code    24 HOUR/OVERNIGHT EVENTS   Transferred to hospital medicine unit overnight. Still requiring 6 LPM NC for SaO2 >88%.     No acute events overnight.     SUBJECTIVE   Patient reports improved shortness of breath and cough this morning. She is able to maintain conversational speech without SOB. She notes that her cough is exacerbated by recumbent positioning. She still has a productive cough with sputum described as "brownish white" with no reported hemoptysis. She reports decreased coughing frequency. No chest pain or palpitations. No nausea or vomiting with good appetite. Occasional dizziness and lightheadedness with coughing spells.     OBJECTIVE      PHYSICAL EXAM  Vital Range (24hr)  BP: (135-193)/(81-140)   Temp:  [36.1 C (97 F)-37 C (98.6 F)]   Temp src: Temporal (06/02 0806)  Heart Rate:  [95-130]   Resp:  [14-33]   SpO2:  [82 %-100 %]   Height:  [152.4 cm (5')]   Weight:  [113.4 kg (250 lb)]     Intake/Output Summary (Last 24 hours) at 01/13/2020 0851  Last data filed at 01/13/2020 0806  Gross per 24 hour   Intake 922.34 ml   Output    Net 922.34 ml     General: Well-appearing; not in acute distress.   Skin: Warm and dry. No rashes.  Heart: Regular rate and rhythm. S1 and S2 with no murmurs, rubs, or gallops.   Lungs: Normal respiratory effort. Bilateral crackles when auscultating the posterior.  Abdomen: Normoactive bowel sounds. Abdomen soft, non-tender and non-distended. No masses, fluid wave or shifting dullness.   Musculoskeletal: No red, swollen, or tender joints.  Vascular: Warm extremities. No peripheral edema.  Lymphatic: No cervical lymphadenopathy.   Neurologic: A&O x3. No focal deficits.     STUDIES AND RESULTS  Labs:   BMP/Electrolytes CBC   Recent Labs     01/13/20  0028 01/12/20  0922   Sodium 137 141   Potassium 3.6 3.3   Chloride 97 100   CO2 24 26   UN 17 11   Creatinine 0.77 0.70   Glucose 245* 118*   Calcium 8.7* 9.0   Magnesium  --   1.5*   Albumin  --  3.7   Phosphorus  --  3.6    Recent Labs     01/13/20  0028 01/12/20  1614 01/12/20  0922   WBC 9.7  --  9.3   Hemoglobin 11.4 12.7 10.6*   Hematocrit 34  --  34   Platelets 357  --  335       Lactate: 4.9   Procalcitonin: 0.21  CRP: 139  ESR: 89  LDH: 563  INR = 1.5  Fibrinogen = 814     Liver Function Other Studies   Recent Labs     01/12/20  0922   AST 28   ALT 17   Bilirubin,Total 0.3   Bilirubin,Direct <0.2     No components found with this basename: ALKPHOS   Beta-2 glycoprotein: Pending  Anti cardiolipin: Pending  Anti-dsDNA: Pending  ANA Ab: Pending  Anti-SSA/SSB: Pending  Anti-rnp/smith: Pending   Myeloperoxidase: Pending  ANCA screen: Pending    Aspergillus Ab: Pending  Beta-D glucan: Pending  Glomerular BM Ab: Pending    C4: 24  C3: 149     Micro:   Legionella Antigen: NEG  S. Pneumoniae antigen: NEG    UA (  6/1): 1.031 sp.g.; 2+ protein with 1+ bacteria [4+ SE]     Imaging:   CT angio chest    Result Date: 01/12/2020  Sub-optimal examination for segmental and subsegmental pulmonary embolism due to respiratory motion artifact. No evidence of pulmonary embolism. Extensive bilateral patchy and groundglass lung opacification, likely multifocal infection/inflammation with underlying mild edema. Follow-up chest imaging should be performed as clinically warranted. Moderate to large pericardial effusion. Cardiomegaly. Mild pulmonary artery enlargement. Reflux of IV contrast into the IVC and hepatic veins, suggestive of elevated right heart pressures. Aortic valve calcification. Moderate left-sided coronary artery calcifications. These findings are advanced for patient's age. Clinical correlation is recommended. Multiple borderline sized to mildly enlarged mediastinal and hilar lymph nodes, suspected to be reactive. END REPORT [facility] submits this DICOM format image data and final report to the [facility], an independent secure electronic health information exchange, on a reciprocally  searchable basis (with patient authorization) for a minimum of 12 months after exam date.    *Chest standard frontal and lateral views    Result Date: 01/12/2020  Extensive bilateral lung opacities, suspicious for pulmonary edema given cardiac silhouette enlargement. Superimposed infection cannot be excluded in the appropriate clinical context. Underlying fibrotic lung disease cannot be excluded given the clinical  history. Cardiac silhouette enlargement, could be due to cardiomegaly, however findings are concerning for pericardial effusion. Clinical/echocardiographic correlation is recommended. END OF IMPRESSION [facility] submits this DICOM format image data and final report to the [facility], an independent secure electronic health information exchange, on a reciprocally searchable basis (with patient authorization) for a minimum of 12 months after exam date.    *Transthoracic echocardiogram.    Result Date: 01/12/2020.   Technically difficult study due to body habitus / poor acoustic windows  Concentric LVH with abnormal diastolic function and left atrial enlargement.  Normal LVEF without significant wall motion abnormalities. Aortic valve sclerosis with estimated moderate stenosis  Right heart enlargement/dysfunction with estimated moderately elevated pulmonary artery systolic pressure  Small circumferential pericardial effusion without frank tamponade  Mildly dilated ascending aorta.     EKG/Telemetry:   -Telemetry: NSR with episodes of sinus tachycardia.    MEDICATIONS  Scheduled Meds:   piperacillin-tazobactam  4.5 g Intravenous Q8H    vancomycin  2,000 mg Intravenous Q12H    insulin glargine  10 units Subcutaneous Nightly    insulin lispro  0-20 units Subcutaneous TID WC    colchicine  0.6 mg Oral Daily    rosuvastatin  20 mg Oral Daily    pantoprazole  40 mg Oral QAM    cetirizine  10 mg Oral Daily    methylPREDNISolone IV  60 mg Intravenous Q12H    metoprolol  50 mg Oral Q6H     Continuous  Infusions:   Vancomycin - Pharmacist to Dose       PRN Meds:.phenol, sodium chloride, dextrose, sodium chloride, dextrose, acetaminophen, Nursing communication- Give 4 OZ of fruit juice for BG < 70 mg/dl **AND** dextrose **AND** dextrose **AND** glucagon **AND** POCT glucose, ipratropium-albuterol      ASSESSMENT   Emmary Culbreath is a 44 y.o. female, history significant for HTN, HLD, DM2, SLE c/b IPF, atrial fibrillation s/p ablation, and CHF, admitted with acute onset shortness of breath and cough. Work-up notable for tachycardia, tachypnea, non-productive cough, CT chest showing pericardial effusion and diffuse, ground-glass opacifications, and CXR showing cardiomegaly. Presentation is consistent with hypoxic respiratory failure of unknown etiology, likely inflammatory pulmonary disease versus cardiogenic. Differential is broad,  and includes CHF exacerbation (2/2 infection, atrial fibrillation, or enlarging pericardial effusion), diffuse alveolar hemorrhage, and parenchymal inflammation in the setting of SLE and IPF. Current management is geared towards symptomatic management of SOB and diagnostic etiological evaluation.     PLANS   1) Acute hypoxic respiratory failure (unknown etiology; likely inflammatory ISO CHF/AF)  -Currently dyspneic with 6 LPM NC O2 requirement. Goal to wean oxygen with SaO2 > 88%.    -Hold Vancomycin 2000 mg IV q12 and zosyn 4.5 g IV q8. Patient not demonstrated leukocytosis, fever, or other signs of systemic inflammation.   -Pending blood cultures and sputum cultures.  -Elevated ESR, CRP, and LDH, suggestive of inflammatory etiology. Normal complement levels.   -Negative urine legionella antigen and S. Pneumoniae antigen. UA likely contaminated given high squamous epithelial load. Repeat UA likely not required given lack of UTI symptoms.   -Pending ANA, dsDNA to evaluate for SLE flare.  -Appreciate pulmonology and rheumatology recommendations.   -BMP and CBC once daily.   -VS q4.      2) Pericardial effusion (possible acute on chronic)  -Echocardiogram does not demonstrate fluid overload or acute cardiac pathology  -Colchicine 0.6 mg PO once daily.   -Hold furosemide 40 mg PO twice daily.  -BMP once daily.     3) Atrial Fibrillation with RVR  -Metoprolol tartrate 50 mg PO twice daily.  -Diltiazem 240 mg PO once daily.   -Hold apixaban 5 mg PO twice daily.   -Continuous telemetry.   -VS q4.     4) SLE c/b IPF  -Hold home azathioprine 50 mg PO BID and hydroxychloroquine 200 mg PO BID.   -Hold home Esbriet.     5) HTN  -Elevated blood pressures while admitted.   -Resume home losartan 50 mg PO once daily.     6) HLD  -Hold home rosuvastatin 20 mg PO once daily.     F: PO  E: N/A  N: Full diet.   GI Prophylaxis: Pantoprazole 40 mg PO once daily.   DVT Prophylaxis: IPC and encourage ambulation. Holding anticoagulants due to concern for DAH.   Lines: 2x pIV (R/L forearm)    Physical Therapy/Activity:  Return to prior living arrangements, pending clinical improvement.     Full Code     Discharge Plan: Pending clinical improvement.     Alan Ripper  8:51 AM, 01/13/2020

## 2020-01-13 NOTE — Progress Notes (Addendum)
PULMONARY FOLLOW-UP    Subjective:   Cough is better.  Dyspnea is better.  Per OT sats 88-92% while ambulating on 8L.  No new complaints.    Objective:  BP: (135-175)/(81-114)   Temp:  [36.2 C (97.2 F)-37 C (98.6 F)]   Temp src: Temporal (06/02 0806)  Heart Rate:  [95-129]   Resp:  [14-33]   SpO2:  [82 %-100 %]   Height:  [152.4 cm (5')]   Weight:  [113.4 kg (250 lb)]     Last set of vitals:  Vitals:    01/13/20 0806   BP: (!) 163/109   Pulse: 96   Resp: 20   Temp: 36.2 C (97.2 F)   Weight:    Height:          Intake/Output Summary (Last 24 hours) at 01/13/2020 1037  Last data filed at 01/13/2020 0806  Gross per 24 hour   Intake 922.34 ml   Output    Net 922.34 ml         General: alert, NAD  HENT: MMM  CV: RRR, extremities warm, mild pitting edema to shins  Pulm: breathing comfortably, bibasilar crackles  Extremities: no overt joint abnormalities  Skin: no rashes or lesions  Neuro: alert, no overt focal deficits          Component Value Date/Time    NA 137 01/13/2020 0028    K 3.6 01/13/2020 0028    CL 97 01/13/2020 0028    CO2 24 01/13/2020 0028    UN 17 01/13/2020 0028    CREAT 0.77 01/13/2020 0028    GFRC 94 01/13/2020 0028    GFRB 108 01/13/2020 0028    GLU 245 (H) 01/13/2020 0028    CA 8.7 (L) 01/13/2020 0028    PO4 3.6 01/12/2020 0922          Component Value Date/Time    WBC 9.7 01/13/2020 0028    HGB 11.4 01/13/2020 0028    HCT 34 01/13/2020 0028    PLT 357 01/13/2020 0028      No results for input(s): TROP, CK, MCKMB, RI, BNP in the last 168 hours.     Radiology:  No results found.    Current Meds:   Current Facility-Administered Medications   Medication Dose Route Frequency    phenol (CHLORASEPTIC) 1.4 % liquid LIQD 2 spray  2 spray Oral Q2H PRN    insulin glargine (LANTUS/SEMGLEE) injection 15 units  15 units Subcutaneous Nightly    losartan (COZAAR) tablet 50 mg  50 mg Oral Daily    dilTIAZem (CARDIZEM CD, CARTIA XT, DILT-CD) 24 hr capsule 240 mg  240 mg Oral Daily    sodium chloride 0.9 %  FLUSH REQUIRED IF PATIENT HAS IV  0-500 mL/hr Intravenous PRN    dextrose 5 % FLUSH REQUIRED IF PATIENT HAS IV  0-500 mL/hr Intravenous PRN    sodium chloride 0.9 % FLUSH REQUIRED IF PATIENT HAS IV  0-500 mL/hr Intravenous PRN    dextrose 5 % FLUSH REQUIRED IF PATIENT HAS IV  0-500 mL/hr Intravenous PRN    acetaminophen (TYLENOL) tablet 650 mg  650 mg Oral Q4H PRN    dextrose (GLUTOSE) 40 % oral gel 15 g  15 g Oral PRN    And    dextrose 50% (0.5 g/mL) injection 25 g  25 g Intravenous PRN    And    glucagon (GLUCAGEN) injection 1 mg  1 mg Intramuscular PRN    insulin lispro (HumaLOG,ADMELOG)  injection 0-20 units  0-20 units Subcutaneous TID WC    colchicine tablet 0.6 mg  0.6 mg Oral Daily    rosuvastatin (CRESTOR) tablet 20 mg  20 mg Oral Daily    pantoprazole (PROTONIX) EC tablet 40 mg  40 mg Oral QAM    cetirizine (ZyrTEC) tablet 10 mg  10 mg Oral Daily    ipratropium-albuterol (DUONEB) 0.5-2.5mg  /40mL nebulization solution 3 mL  3 mL Nebulization 4x Daily PRN    methylPREDNISolone sodium succinate (SOLU-Medrol) 62.5 mg/mL injection 60 mg  60 mg Intravenous Q12H    metoprolol tartrate (LOPRESSOR) tablet 50 mg  50 mg Oral Q6H       Assessment/Plan:  Jodi Holmes is a 72 F with PMHx of SLE on HCQ and azathioprine, 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    She has subjectively improved since admission with interventions in the interim being diuresis and steroids for SLE.  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  She remains too hypoxemic to safely bronch in order to confirm [facility].    She likely also has a component of pulmonary edema  contributing to her respiratory failure/PH and for this we recommend ongoing diuresis.      Recommendations:  - continue methylprednisolone  - follow up AIR recs for dose adjustment if felt necessary  - follow up rheum labs per AIR  - bdg and aspergillus ag pending  - would continue IV diuresis with lasix 20mg  and repeat as needed for goal net -1-2L  - continue to hold Esbriet  - we will continue to follow  - discussed with Dr. Everlean Alstrom    Signed by Kennith Gain, MD on 01/13/2020 at 10:37 AM  Pulmonary and critical care fellow

## 2020-01-13 NOTE — Progress Notes (Signed)
[id number] Nursing Admission Note    Patient admitted from ED, report received from [place], [place].  Patient arrived to unit via Bed . Vital signs stable.  Patient oriented to call bell, unit, and phone.  Four-eyed skin assessment completed with Kathyrn Drown, RN. No skin breakdown noted. Admission history partially completed.  Patient resting in bed. Writer reviewed current visitation policies with patient,   Darcel Smalling, RN

## 2020-01-13 NOTE — Plan of Care (Signed)
Problem: Impaired ADL  Goal: Increase ADL Independence  Note: Pt will complete ADLs modI within 2-3 sessions  Pt will complete selfcare transfers modI within 2-3 sessions  Pt will tolerate dynamic stance up to 8 min while maintaining SpO2 over 92% in prep for ADLs/IADLs within 2-3 sessions

## 2020-01-13 NOTE — Progress Notes (Signed)
Hospital Medicine Progress Note    LOS: 1 day                                                Significant 24 Hour Events      Admitted to hospital medicine yesterday for acute hypoxic respiratory failure, likely secondary to pulmonary    Subjective      Patient is feeling much better this morning. She is still on 6 L, but is breathing much more easily and is able to hold conversation without getting short of breath. She was also able to get some sleep last night, which was very helpful. Her throat is sore from coughing overnight, and when she coughs she still has some soreness in her chest, but otherwise denies any pain.    Objective      Vitals:  Temp:  [36.1 C (97 F)-37 C (98.6 F)] 37 C (98.6 F)  Heart Rate:  [95-130] 98  Resp:  [14-33] 22  BP: (135-193)/(81-140) 135/81    Physical Exam  General Constitutional: Patient calm, pleasant, cooperative, but ill-appearing  HEENT: MMM, no tonsillar erythema, no ulcerations or erosions, PERRL, no LAD or thyromegaly  Cardiovascular: Tachycardia, irregular rhythm, normal S1/S2 no MRG, no obvious JVD  Pulmonary: Normal work of breathing, diffuse, dry crackles posteriorly  Gastrointestinal: active bowel sounds, non-tender to palpation, non-distended  Extremities: trace pitting edema bilaterally, brisk peripheral pulses  Neurologic: AOx3, no FND   Skin: no rashes    Laboratory Data:  Recent Labs   Lab 01/13/20  0028 01/12/20  1614 01/12/20  0922   WBC 9.7  --  9.3   Hemoglobin 11.4 12.7 10.6*   Hematocrit 34  --  34   Platelets 357  --  335   MCV 79  --  80   RDW 15.2*  --  15.3*     Recent Labs   Lab 01/13/20  0028 01/12/20  0922   Sodium 137 141   Potassium 3.6 3.3   Chloride 97 100   CO2 24 26   Anion Gap 16 15   UN 17 11   Creatinine 0.77 0.70   Glucose 245* 118*   Calcium 8.7* 9.0   Phosphorus  --  3.6   Magnesium  --  1.5*         Microbiology:   COVID negative  Influenza A/B negative  RSV negative  Blood Cultures NGTD  Sputum Cultures Pending  Aspergillus  Antigen pending  Strep Pneumo Antigen negative  Legionella Antigen negative    Relevant Imaging:   ECHO:  Technically difficult study due to body habitus / poor acoustic windows   Concentric LVH with abnormal diastolic function and left atrial enlargement.  Normal LVEF without significant wall motion abnormalities. Aortic valve sclerosis with estimated moderate stenosis  Right heart enlargement/dysfunction with estimated moderately elevated pulmonary artery systolic pressure  Small circumferential pericardial effusion without frank tamponade  Mildly dilated ascending aorta.     CTA Chest:  Sub-optimal examination for segmental and subsegmental pulmonary embolism due to respiratory motion artifact.   No evidence of pulmonary embolism.      Extensive bilateral patchy and groundglass lung opacification, likely multifocal infection/inflammation with underlying mild edema. Follow-up chest imaging should be performed as clinically warranted.      Moderate to large pericardial effusion.  Cardiomegaly. Mild pulmonary artery enlargement. Reflux of IV contrast into the IVC and hepatic veins, suggestive of elevated right heart pressures.      Aortic valve calcification. Moderate left-sided coronary artery calcifications. These findings are advanced for patient's age. Clinical correlation is recommended.      Multiple borderline sized to mildly enlarged mediastinal and hilar lymph nodes, suspected to be reactive.     Chest X-Ray:  Extensive bilateral lung opacities, suspicious for pulmonary edema given cardiac silhouette enlargement. Superimposed infection cannot be excluded in the appropriate clinical context. Underlying fibrotic lung disease cannot be excluded given the clinical   history.      Cardiac silhouette enlargement, could be due to cardiomegaly, however findings are concerning for pericardial effusion. Clinical/echocardiographic correlation is recommended.     Assessment     Jodi Holmes is a 44 y.o.  female with medical history significant for lupus, pulmonary fibrosis, CHF, paroxysmal atrial fibrillation with RVR, CAD s/p LAD PCI, pericardial effusion, moderate aortic stenosis, diabetes mellitus, HTN, and HLD who presents in acute hypoxic respiratory failure. Etiology of respiratory failure unclear at this time. It is more likely pulmonary due to ground glass opacities on CT, relatively euvolemic exam, and history of both lupus and pulmonary fibrosis. Cardiogenic causes cannot be excluded at this time, however, due to elevated BNP, atrial fibrillation, and history of CHF. Patient doing better this morning, though given she was treated for infection, inflammation, and fluid overload the exact cause of this improvement is murky. At this time suspect steroids had the most benefit, given entire clinical constellation. Will stop antibiotic and diuretic therapy with close monitoring. Low threshold to resume broad treatment.    Plan     Acute Hypoxic Respiratory Failure Etiology not clear at this time. Will pursue workup for cardiogenic, pulmonary, and infectious sources. Likely pulmonary etiology due to CT findings  - Chest x-ray and CTA as above, notable for significant GGOs, no PE  - s/p methylprednisolone 125, lasix 40, 1 L NS, and Duo-nebs in the ED  - Currently on 6 L NC, VBG indicative of hypoxemia  - ESR/CRP 89/139, procal 0.21  - Will stop Vanc, zosyn for empiric coverage of possible multi-focal pneumonia given absence of fever, white count, and relatively low procal  - Blood cultures NGTD, sputum cultures not sent  - MRSA swab pending  - Aspergillus and beta-2-glycoprotein antigens pending  - LDH 563, does not rule out pneumocystis pneumonia  - UA notable for 2+ protein, trace glucose and ketones, microscopy with 4+ squams  - urine legionella and strep pneumo antigens negative  - Pulmonology consult, appreciate their assistance   - 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   - Continue 60 mg solumedrol IV Q12   - Hold home  Esbriet  - Rheumatology consult, appreciate their input   - C3, C4 within normal limits. ANA, dsDNA pending   - ANCA, MPO, Anti Smith, Anti SSA/SSB, ANA, Anti-dsDNA pending   - Lupus anticoagulation panel notable for fibrinogen of 814, INR 1.5,  anti-cardiolipin antibody pending    Pericardial Effusion chronic, no tamponade physiology on presentation so unlikely acutely enlarging  - ECHO as above, size of effusion stable compared to prior exam from outside hospital  - S/p 40 mg IV lasix x2 yesterday, minimal symptomatic improvement  - Continue home colchicine 0.6 mg daily  - Hold home diuresis    Paroxysmal Atrial Fibrillation with RVR  - Home metoprolol 50 mg BID  - ECHO as above  - Continue home  diltiazem 240 mg daily   - Metoprolol 5 mg IV PRN for sustain HR >140  - Hold home Eliquis 5 mg BID    Lupus with secondary Pulmonary Fibrosis unclear if in flare at this time  - Rheumatology consulted, appreciate assistance  - C3, C4 within normal limits  - ANCA, MPO, Anti Smith, Anti SSA/SSB, ANA, Anti-dsDNA pending  - ESR/CRP 89/139, procal 0.21  - Hold home plaquenil and azothioprine  - Hold home Esbriet    Diabetes mellitus Type 2  - Increase lantus to 15 units nightly  - Lispro SS, half home NB 5  - POCT BGs with meals and nightly  - BGs in 200s overnight  - Hold home Victoza    Chronic problems:  HTN: Continue home losartan 50 mg daily  HLD: Continue home Crestor 20 mg daily  GERD: Home Protonix 40 mg daily  CAD: Hold home plavix, recent cardiology note indicates stopping    Labs: CBC, BMP daily  Diet: Consistent Carbohydrate  DVT ppx: Contraindicated for Possible [facility]     Discharge Planning:   Expected Discharge Date: 01/17/2020  Discharge Criteria/Barriers to Discharge: Oxygen Requirement  PT and/or OT Recommendations/Discharge to: Pending  Post-discharge Appointments Needed with: PCP, Pulmonology, Rehumatology    Code Status: FULL CODE    Conni Elliot, MD  Internal Medicine PGY-1  [phone]

## 2020-01-13 NOTE — Progress Notes (Signed)
Spoke to patient about discharge planning. Patient declined home care services as she is only visiting and lives in [place]. Patient is staying at her mother's house.    Roosevelt Locks, RN-BC  Acute Care Coordinator [id number]  Phone # [phone]  Pager # [phone]  Weekend Pager # [phone]

## 2020-01-13 NOTE — ED Notes (Signed)
Report Given To  Amil Amen, RN      Descriptive Sentence / Reason for Admission   Pt arrived to ED with complaint of SOB and tachycardia into the 140's. Endorsing cough and dyspnea.       Active Issues / Relevant Events   -A&Ox4, ambulatory   -Full code  -CT neg PE  -Lung Xray shows extensive bilateral lung opacities, suspicious for pulmonary edema or diffuse alveolar hemorrhage  -New onset of CHF   -[place] 6L NC baseline is Room air  -Tele - A. fib  - PMH lupus, pulm fibrosis, pericardial effusion      To Do List  -BG ACHS   -Meds per mar  -tele monitor HR  -VS/A Q4  -consult rheumatology      Anticipatory Guidance / Discharge Planning  Admit for onset of CHF and Pulmonology work up

## 2020-01-13 NOTE — Progress Notes (Signed)
Spoke to rheumatologist office Dr Deanne Coffer, she has lupus/MCTD with symptoms of arthralgias, rash, Raynaud's, ILD and puffy hands. Last labs in 10/2019 with dsDNA < 1 and complements C3 125 and C4 20. Currently dyspnea is improved as she it is less exertional and does not have production with her cough.   General: well appearing, NC/AT  HEENT: no oral or nasal ulcers, middle patchy hair loss   Heart: RRR, S1, S2 present, no murmurs or rubs heard  Lungs: b/l crackles   Abdomen: + BS heard, ND, NT to palpation  Extremities: no rash and small finger joints and feet without swelling  Recommend continue with solumedrol 60mg  IV BID for now and decrease to daily tomorrow if she continues to improve  Monitor oxygen and decrease/wean based on O2 sats  Continue azathioprine and plaquenil doses confirmed with rheumatologist

## 2020-01-13 NOTE — Plan of Care (Signed)
Problem: Mobility  Goal: Functional status is maintained or improved - Geriatric  Outcome: Progressing towards goal     Problem: Safety  Goal: Patient will remain free of falls  Outcome: Maintaining  Goal: Prevent any intentional injury  Outcome: Maintaining     Problem: Pain/Comfort  Goal: Patient's pain or discomfort is manageable  Outcome: Maintaining     Problem: Nutrition  Goal: Nutritional status is maintained or improved - Geriatric  Outcome: Maintaining     Problem: Psychosocial  Goal: Demonstrates ability to cope with illness  Outcome: Maintaining     Problem: Cognitive function  Goal: Cognitive function will be maintained or return to baseline  Outcome: Maintaining     Problem: Potential Alteration in Skin Integrity - Pressure Ulcer  Goal: The patient will maintain intact skin free of pressure ulcer  Outcome: Maintaining

## 2020-01-13 NOTE — Plan of Care (Signed)
Problem: Impaired Bed Mobility  Goal: STG - IMPROVE BED MOBILITY  Note: Patient will perform bed mobility without rails and the head of bed flat with No assist (Independently) of 1    Time frame: 7-10 days     Problem: Impaired Transfers  Goal: STG - IMPROVE TRANSFERS  Note: Patient will complete Sit to stand transfers using least restrictive assistive device with Modified independence of 1    Time frame: 7-10 days     Problem: Impaired Ambulation  Goal: STG - IMPROVE AMBULATION  Note: Patient will ambulate 150-299 feet using least restrictive assistive device with Modified independenceof 1    Time frame: 7-10 days       Problem: Impaired Stair Navigation  Goal: STG - IMPROVE STAIR NAVIGATION  Note: Patient will navigate 8-11 steps with 1  rail(s) and least restrictive assistive device and Modified independence of 1    Time frame: 7-10 days     [name] PT, DPT  Pager #[phone]

## 2020-01-13 NOTE — Progress Notes (Signed)
Physical Therapy Initial Evaluation    Discharge Recommendations:  Jodi Holmes currently requires stand by assistance for transfers. Providers requesting writer hold ambulation during evaluation, due to O2 needs. Anticipate she will need 1 - 2 additional PT visits to allow return to prior living environment.     Discharge Recommendations: Intermittent supervision/assist, Anticipate return to prior living arrangement, Home PT    PT Discharge Equipment Recommended: To be determined (anticipate none )    History of Present Admission: Per MD note.Marland KitchenMarland KitchenTwanna Crawfordis a 44 y.o.femalewith medical history significant for lupus, pulmonary fibrosis, CHF, paroxysmal atrial fibrillation with RVR, CAD s/p LAD PCI, pericardial effusion, moderate aortic stenosis, diabetes mellitus, HTN, and HLD who presents in acute hypoxic respiratory failure.     History reviewed. No pertinent past medical history.     History reviewed. No pertinent surgical history.    Personal factors affecting treatment/recovery:   None identified    Comorbidities affecting treatment/recovery:   Cardiac history   Diabetes   Lupus    Clinical presentation:   stable    Patient complexity:     low level as indicated by above stability of condition, personal factors, environmental factors and comorbidities in addition to their impairments found on physical exam.     01/13/20 0900   Prior Living    Prior Living Situation Reported by patient   Lives With Family  (mother )   [place] Help From Independent   Vocational Other (comment)  (not currently working )   Type of Home 2 Story Home   # Steps to Enter Home 4   # Rails to [place] 2   # Of Steps In Home 10   # Rails in Home 1   Location of Bedrooms 2nd floor   Location of Bathrooms 1st floor;2nd floor   [place] in Home None   Additional Comments The pt reported she typically lives with her spouse and 1 yo son in a 3rd floor apartment ([address]). She did reported she would be staying  with her mom after d/c and her mother's home set up is above.    Prior Function Level   Prior Function Level Reported by patient   Transfers Independent   Transfer Devices None   Walking Independent   Walking assistive devices used None   Stair negotiation Independent   Additional Comments She reported she has been independent with mobility, although would require occasional standing rest breaks, especially when on the stairs.    PT Tracking   PT TRACKING PT Assigned   Visit Number   Visit Number [facility]) / Treatment Day [facility]) 1   Visit Details [facility])   Visit Type [facility]) Evaluation   Precautions/Observations   Precautions used Yes   LDA Observation IV lines;O2 (comment);Monitors  (tele, 6.0 L O2 via NC )   Vital Signs Response with Therapy O2 saturation was assessed at start of session (96%) and throughout mobility. O2 saturation remained > 94% when on 6.0 L. RN aware of O2 saturation throughout.    Isolation Precautions None   Was patient wearing a mask? Yes   Fall Precautions General falls precautions   Other Providers in room at start of evaluation. Providers requesting writer primarily complete bed mobility/transfers and hold ambulation unil next session. Providers also requesting writer monitor O2 saturation throughout.    Pain Assessment   *Is the patient currently in pain? Yes   Pain (Before,During, After) Therapy Before;During;After   0-10 Scale 3   Pain Location  Chest;Throat   Pain Descriptors Sore   Pain Intervention(s) Repositioned;Refer to nursing for pain management   Additional comments Pt reported sore throat and chest, due to coughing overnight.    Vision    Current Vision Wears corrective lenses   Cognition   Cognition Tested   Arousal/Alertness Appropriate responses to stimuli   Orientation A&Ox4   Following Commands Follows all commands and directions without difficulty   UE Assessment   UE Assessment Full range RUE AROM;Full range LUE AROM   LUE Strength   Overall Strength WFL able to perform ADL tasks  with strength    Shoulder Flex 4/5    Elbow Flex 4/5      Elbow Ext 4/5     Grip Strength 4+/5   RUE Strength   Overall Strength WFL able to perform ADL tasks with strength    Shoulder Flex 4/5    Elbow Flex 4/5      Elbow Ext 4/5     Grip Strength 4+/5   LE Assessment   LE Assessment Full AROM RLE;Full AROM LLE   Strength LLE   HIP FLEXION 4/5   Overall Strength WFL able to perform ADL tasks with strength   Knee Extension 4/5   Ankle Dorsiflexion 4+/5   Ankle Plantar Flexion 4+/5   Strength RLE   Overall Strength WFL able to perform ADL tasks with strength   HIP FLEXION 4/5    Knee Extension 4/5   Ankle Dorsiflexion 4+/5   Ankle Plantar Flexion 4+/5   Sensation   Sensation No apparent deficit  (Light touch to distal LE's. )   Bed Mobility   Bed mobility Not tested   Additional comments Pt sitting in recliner at start of session and ended session in bedside recliner with call bell in reach.    Transfers   Transfers Tested   Stand Pivot Transfers Stand-by assist;1 person   Sit to Stand Stand by assistance;1 person assist   Stand to sit Stand by assistance;1 person assist   Transfer Assistive Device none   Additional comments The pt completed x 1 stand pivot transfer from bedside recliner < > bed and x 2 sit to stands from bed side recliner. The pt demonstrated proper hand placements throughout, although required cuing for line management. No instability noted and overall no hands on assist was required. O2 saturation maintained > 94% throughout.    Mobility   Mobility: Gait/Stairs Not tested (comment) - providers requested ambulation be held.    Additional comments Pt completed standing marching and took steps to chair during stand pivot transfer. Ambulation to be fully assessed at next session, due to providers request.    Family/Caregiver Training`   Family/caregiver training Yes   Family/caregiver training comment Pt educated on current d/c recommendations - pt denied any additional questions.    Balance   Balance  Tested   Sitting - Static Independent ;Unsupported   Sitting - Dynamic Independent;Unsupported   Standing - Static Standby assist;Unsupported   Standing - Dynamic Standby assist;Unsupported   Functional Outcome Measures   Functional Outcome Measures Yes   PT AM-PAC Mobility   Turning over in bed? 1   Sitting down on and standing up from a chair with arms? 1   Moving from lying on back to sitting on the side of the bed? 1   Moving to and from a bed to a chair? 3   Need to walk in hospital room? 1   Climbing 3 - 5  steps with a railing? 1   Total Raw Score 8   Standardized Score - Calculated 28.58   % Functional Impairment - Calculated 87%   Cumulative Ambulation Score (CAM)   Get in and out of bed 1   Sit to stand, stand to sit from chair 1   Walking 0   Total CAM Score (max 6) 2   Assessment   Brief Assessment Appropriate for skilled therapy   Problem List Pain contributing to impairment;Impaired functional mobility;Impaired stair navigation;Impaired bed mobility;Impaired mobility;Impaired ambulation;Impaired transfers;Impaired endurance;Impaired LE strength;Impaired UE strength   Patient / Family Goal improve breathing    Plan/Recommendation   Treatment Interventions Restorative PT;Bed mobility training;Transfers training;Stair training;Pt/Family education;Strengthening;Gait training;Home exercise program instruction;D/C planning;Will work to minimize pain while promoting mobility whenever possible   PT Frequency 3-5 x/wk   Mobility Recommendations SBA for stand pivot transfer    Referral Recommendations Home care   Discharge Recommendations Intermittent supervision/assist;Anticipate return to prior living arrangement;Home PT   PT Discharge Equipment Recommended To be determined  (anticipate none )   Assessment/Recommendations Reviewed With: Patient;Nursing;Social Worker;Occupational Therapy;Care coordinator   Next PT Visit assess ambulation and stairs (monitor O2 saturation).    Time Calculation   Total Time  Therapeutic Activities (minutes) 0   Total Time Gait Training (minutes) 0   Total Time Therapeutic Exercises (minutes) 0   Total Time Neuromuscular Re-education (minutes) 0   Total Time Group Therapy (minutes) 0   PT Timed Codes 0   PT Untimed Codes 15   PT Unbilled Time 10  (providers present for rounds )   PT Total Treatment 15   PT Total Treatment 15   Plan and Onset date   Plan of Care Date 01/13/20   Onset Date 01/12/20   Treatment Start Date 01/13/20     Sherri Rad PT, DPT  Pager [phone]

## 2020-01-13 NOTE — Progress Notes (Signed)
Occupational Therapy Eval         Occupational Therapy Evaluation        Discharge Recommendations:  Prior Living Environment, Outpatient OT, Assist with IADLs      Hospital Stay Recommendations:  Encourage active participation with ADLs     HPI:  Admitting Dx:   1. Heart failure     2. Pericardial effusion  Echo Complete    Echo Complete        PMH: History reviewed. No pertinent past medical history.    PSH: History reviewed. No pertinent surgical history.    ASSESSMENT       01/13/20 1002   Visit Details [facility])   Visit Type ([place]) Eval-General   Tx Prioritization 4 - Normal   OT Tracking   OT Tracking OT Assigned   Plan and Onset date   Plan of Care Date 01/13/20   Onset Date 01/12/20   Treatment Start Date 01/13/20   OT Last Visit   Visit (#) of Five 1   Precautions   Precautions used Yes   Fall Precautions General falls precautions   LDA Observation O2 (comment);Monitors  (6-8L n/c, per discussion with bedside staff, has been increased to 8L with activity, therefore increased to 8L during dynamic ADLs standing at sink this session)   Other AAT   Patient Wearing Mask Yes   Writer wearing PPE including Gloves;Goggles;Mask   Home Living (Prior to Admission)   Prior Living Situation Reported by patient   Type of Home Apartment   Location of Bedroom First floor   Location of Bathroom First floor   # Steps to [employer]   (3 flights, 3rd floor apartment)   Bathroom Shower/Tub Tub/Shower unit   Prior Function   Prior Function Reported by patient   Level of Independence Independent with ADLs;Independent with ADL functional transfers;Independent ambulation   Lives With Spouse;Child   Receives Help From Independent   IADL Independent   Vocational Unemployed   Additional Comments Pt reprots prior to admission, was modI basic ADLs and selfcare transfers. Reports spouse is supportive and able to assist as needed in the home enviornment.   Pain Assessment   *Is the patient currently in pain? Denies   Vision    Current Vision  Wears corrective lenses   Cognition   Cognition No deficit noted   Additional Comments alert and interactive, oriented x4. engages in conversation appropriatly throughout, following multistep commannds without difficulty. VCs for implementing rests/task modification as needed.   UE Assessment   Additional Comments ROm, strength, coordination WFL for ADls   Bed Mobility   Additional Comments Nt, recieved in/returned to recliner   Functional Transfers   Sit to Stand Supervision  (from recliner)   Stand to Sit Supervision   Toilet Transfers Supervision  (standard height toilet)   Additional Comments pt completing short distance fnxl mobility in room/bathroom w/o a adevice, no voert LOB noted throughout   Balance   Sitting - Static Independent    Sitting - Dynamic Independent   Standing - Static Supervision   Standing - Dynamic Supervision   Standing Tolerance during Functional Task ~12 min at sink, intermittent standing rests throughout, pt declines to take seated rests throughout, SpO2 monitered through finger pulseox, SpO2 between 88-92% on 8L with activity, recovers to 90% within 1 min standing rest   ADL Assessment   Grooming Supervision   Where Grooming Assessed Standing at sink   UE Dressing Set up   LE Dressing Supervision  Where  LE Dressing Assessed Toilet;Standing   Bathing Supervision   Where Bathing Assessed Standing at sink   Toileting Supervision   Where Toileting Assessed Standard toilet   Additional Comments pt completes sponge bathing and grooming standing at sink, toileting from stnadard height toilet, and dressing sit/stand from toilet. Per bedside staff, had previsouly been increasing pt to 8L with activity, therefore put on 8L n/c for duration of ADLs. VCs to take intermittent rests as needed throughout care tasks. pt threads feet into clothing sit/stand from recliner. Toilets from standard height toilet without assist. completes sponge bathing without assist standing at sink.    OT AM-PAC Self  Care   Putting on and taking off regular lower body clothing? 3   Bathing (including washing, rinsing, drying)? 3   Toileting, which includes using toilet, bedpan, or urinal? 3   Putting on and taking off regular upper body clothing? 3   Taking care of personal grooming such as brushing teeth? 3   Eating Meals 4   Total Raw Score 19   CMS Score - Calculated 42.80%   Assessment   Assessment Impaired endurance;Impaired instrumental ADL's   Plan   OT Frequency 2-3x/wk   Patient Will Benefit From Endurance training;Patient/Family training;Equipment eval/education;Compensatory technique education;Community re-entry;Exercises   Multidisciplinary Communication   Multidisciplinary Communication pt, RN, PT   Recommendation   OT Discharge Recommendations Prior Living Environment;Intermittent supervision;Home OT   Additional Comments anticipate return to prior living enviornment at a modI basic ADLs/selfcare transfers as medically improves inhouse. recommend initial intermittent assist IADls and home Ot.   Hospital Stay Recommendations encourage pt engagement in ADLs as tolerated          OCCUPATIONAL THERAPY PROVIDER     Electronically Signed By:   Donnetta Hail, OT    Please contact the OT pager [facility]) for all questions/concerns and/or update requests.    Timed Calculations:  Timed Codes:  0  Untimed Codes: 29 min Ot eval  Unbilled Time: 0  Total Time:  29 min    OT EVALUATION COMPLEXITY     1.  Occupational Profile & History (Medical/Therapy)   Moderate (Expanded Review)    2.  Public relations account executive (Includes occupations from: ADL, IADL, Rest/Sleep, Education, Work, Copywriter, advertising, Leisure, Social Participation)   Moderate (3-5 occupations/performance deficits)    3.  Clinical Decision Making   i  Assessment    Moderate (Detailed)   ii  Co-Morbidities    Moderate (1+)   iii  Modifications    Moderate (Minimum - Moderate)   iv Treatment Options (Approaches include: Create, Promote, Establish, Restore, Maintain, Modify,  Prevent)    Moderate (Several)     Moderate    4.  EVALUATION COMPLEXITY as based on the above provided information   Moderate

## 2020-01-13 NOTE — ED Notes (Signed)
Pt laying down for bed, tachypneic in at 28. Oxygen saturation desat to 83% on 6L NC. Pt repositioned into bed, HOB elevated, on 6L with oxygen saturation at 87% maintained. C&DB exercises completed with good result at O2 92%. Continues to be tachypneic at 28.

## 2020-01-13 NOTE — Progress Notes (Signed)
01/13/20 1021   [facility] Patient Class Review   Patient Class Review Inpatient     Patient class effective as of 01/12/2020.    Nena Polio, RN  Utilization Management  [phone]

## 2020-01-14 LAB — CBC AND DIFFERENTIAL
Baso # K/uL: 0 10*3/uL (ref 0.0–0.1)
Basophil %: 0.1 %
Eos # K/uL: 0 10*3/uL (ref 0.0–0.4)
Eosinophil %: 0 %
Hematocrit: 33 % — ABNORMAL LOW (ref 34–45)
Hemoglobin: 10.6 g/dL — ABNORMAL LOW (ref 11.2–15.7)
IMM Granulocytes #: 0 10*3/uL (ref 0.0–0.0)
IMM Granulocytes: 0.3 %
Lymph # K/uL: 0.4 10*3/uL — ABNORMAL LOW (ref 1.2–3.7)
Lymphocyte %: 2.9 %
MCH: 26 pg (ref 26–32)
MCHC: 32 g/dL (ref 32–36)
MCV: 81 fL (ref 79–95)
Mono # K/uL: 0.5 10*3/uL (ref 0.2–0.9)
Monocyte %: 3.8 %
Neut # K/uL: 11.4 10*3/uL — ABNORMAL HIGH (ref 1.6–6.1)
Nucl RBC # K/uL: 0 10*3/uL (ref 0.0–0.0)
Nucl RBC %: 0.2 /100 WBC (ref 0.0–0.2)
Platelets: 373 10*3/uL — ABNORMAL HIGH (ref 160–370)
RBC: 4.1 MIL/uL (ref 3.9–5.2)
RDW: 15.7 % — ABNORMAL HIGH (ref 11.7–14.4)
Seg Neut %: 92.9 %
WBC: 12.3 10*3/uL — ABNORMAL HIGH (ref 4.0–10.0)

## 2020-01-14 LAB — POCT GLUCOSE
Glucose POCT: 219 mg/dL — ABNORMAL HIGH (ref 60–99)
Glucose POCT: 275 mg/dL — ABNORMAL HIGH (ref 60–99)
Glucose POCT: 212 mg/dL — ABNORMAL HIGH (ref 60–99)
Glucose POCT: 254 mg/dL — ABNORMAL HIGH (ref 60–99)

## 2020-01-14 LAB — ANTI DS-DNA AB: dsDNA Ab: 1 IU/mL (ref 0–4)

## 2020-01-14 LAB — LUPUS ANTICOAGULANT PROFILE
Fibrinogen: 814 mg/dL — ABNORMAL HIGH (ref 172–409)
INR: 1.5 — ABNORMAL HIGH (ref 0.9–1.1)
Protime: 17.4 s — ABNORMAL HIGH (ref 10.0–12.9)
aPTT: 32 s (ref 25.8–37.9)

## 2020-01-14 LAB — BASIC METABOLIC PANEL
Anion Gap: 12 (ref 7–16)
CO2: 26 mmol/L (ref 20–28)
Calcium: 9 mg/dL (ref 8.8–10.2)
Chloride: 100 mmol/L (ref 96–108)
Creatinine: 0.85 mg/dL (ref 0.51–0.95)
GFR,Black: 96 *
GFR,Caucasian: 83 *
Glucose: 243 mg/dL — ABNORMAL HIGH (ref 60–99)
Lab: 22 mg/dL — ABNORMAL HIGH (ref 6–20)
Potassium: 4.1 mmol/L (ref 3.3–5.1)
Sodium: 138 mmol/L (ref 133–145)

## 2020-01-14 LAB — SPEC COAG REVIEW

## 2020-01-14 LAB — LUPUS ANTICOAGULANT: Lupus Anticoagulant: NEGATIVE

## 2020-01-14 MED ORDER — METOPROLOL TARTRATE 50 MG PO TABS *I*
100.0000 mg | ORAL_TABLET | Freq: Two times a day (BID) | ORAL | Status: DC
Start: 2020-01-15 — End: 2020-01-14

## 2020-01-14 MED ORDER — METHYLPREDNISOLONE 32 MG PO TABS *I*
64.0000 mg | ORAL_TABLET | Freq: Every day | ORAL | Status: DC
Start: 2020-01-15 — End: 2020-01-16
  Administered 2020-01-15: 64 mg via ORAL
  Filled 2020-01-14 (×2): qty 2

## 2020-01-14 MED ORDER — FUROSEMIDE 10 MG/ML IJ SOLN *I*
20.0000 mg | Freq: Once | INTRAMUSCULAR | Status: AC
Start: 2020-01-14 — End: 2020-01-14
  Administered 2020-01-14: 20 mg via INTRAVENOUS
  Filled 2020-01-14: qty 4

## 2020-01-14 MED ORDER — COLCHICINE 0.6 MG PO TABS *I*
0.6000 mg | ORAL_TABLET | ORAL | Status: DC
Start: 2020-01-16 — End: 2020-01-17
  Administered 2020-01-16: 0.6 mg via ORAL
  Filled 2020-01-14: qty 1

## 2020-01-14 MED ORDER — LOSARTAN POTASSIUM 50 MG PO TABS *I*
75.0000 mg | ORAL_TABLET | Freq: Every day | ORAL | Status: DC
Start: 2020-01-15 — End: 2020-01-15
  Filled 2020-01-14: qty 1

## 2020-01-14 MED ORDER — METOPROLOL TARTRATE 50 MG PO TABS *I*
100.0000 mg | ORAL_TABLET | Freq: Two times a day (BID) | ORAL | Status: DC
Start: 2020-01-14 — End: 2020-01-17
  Administered 2020-01-14 – 2020-01-17 (×6): 100 mg via ORAL
  Filled 2020-01-14 (×6): qty 2

## 2020-01-14 MED ORDER — INSULIN LISPRO (HUMAN) 100 UNIT/ML IJ/SC SOLN *WRAPPED*
0.0000 [IU] | Freq: Three times a day (TID) | SUBCUTANEOUS | Status: DC
Start: 2020-01-14 — End: 2020-01-16
  Administered 2020-01-14: 10 [IU] via SUBCUTANEOUS
  Administered 2020-01-14: 12 [IU] via SUBCUTANEOUS
  Administered 2020-01-14: 10 [IU] via SUBCUTANEOUS
  Administered 2020-01-15 (×2): 12 [IU] via SUBCUTANEOUS
  Administered 2020-01-15: 9 [IU] via SUBCUTANEOUS

## 2020-01-14 NOTE — Progress Notes (Signed)
Hospital Medicine Progress Note    LOS: 2 days                                                Significant 24 Hour Events      No acute events overnight.    Subjective      Patient is feeling much better this morning. She is still on 6 L, but is breathing much more easily and is able to hold conversation without getting short of breath. During our discussion we weaned her to 2 L, which she tolerated without dropping below 92% saturation. She urinated a lot with the lasix, and was able to lie down (though not flat) and get good sleep last night. She has no complaints this morning.    Objective      Vitals:  Temp:  [36.2 C (97.2 F)-36.5 C (97.7 F)] 36.3 C (97.3 F)  Heart Rate:  [76-96] 90  Resp:  [20-24] 22  BP: (144-166)/(85-109) 155/88  FiO2:  [6 %] 6 %    Physical Exam  General Constitutional: Patient calm, pleasant, cooperative, in no acute distress  HEENT: MMM, no tonsillar erythema, no ulcerations or erosions, PERRL, no LAD or thyromegaly  Cardiovascular: Irregular rhythm, regular rate, normal S1/S2 no MRG, no obvious JVD  Pulmonary: Normal work of breathing, diffuse, dry crackles posteriorly  Gastrointestinal: active bowel sounds, non-tender to palpation, non-distended  Extremities: trace pitting edema bilaterally, brisk peripheral pulses  Neurologic: AOx3, no FND   Skin: no rashes    Laboratory Data:  Recent Labs   Lab 01/14/20  0102 01/13/20  0028 01/12/20  1614 01/12/20  0922 01/12/20  0922   WBC 12.3* 9.7  --   --  9.3   Hemoglobin 10.6* 11.4 12.7   < > 10.6*   Hematocrit 33* 34  --   --  34   Platelets 373* 357  --   --  335   MCV 81 79  --   --  80   RDW 15.7* 15.2*  --   --  15.3*    < > = values in this interval not displayed.     Recent Labs   Lab 01/14/20  0102 01/13/20  1900 01/13/20  0028 01/12/20  0922 01/12/20  0922   Sodium 138 138 137   < > 141   Potassium 4.1 3.8 3.6   < > 3.3   Chloride 100 98 97   < > 100   CO2 _0 < > 26   Anion Gap 12 18* 16   < > 15   UN 22* 19 17   < > 11    Creatinine 0.85 0.85 0.77   < > 0.70   Glucose 243* 229* 245*   < > 118*   Calcium 9.0 9.0 8.7*   < > 9.0   Phosphorus  --   --   --   --  3.6   Magnesium  --   --   --   --  1.5*    < > = values in this interval not displayed.         Microbiology:   COVID negative  Influenza A/B negative  RSV negative  Blood Cultures NGTD  Sputum Cultures Pending  Aspergillus Antigen pending  Strep Pneumo Antigen negative  Legionella Antigen  negative    Relevant Imaging:   ECHO:  Technically difficult study due to body habitus / poor acoustic windows   Concentric LVH with abnormal diastolic function and left atrial enlargement.  Normal LVEF without significant wall motion abnormalities. Aortic valve sclerosis with estimated moderate stenosis  Right heart enlargement/dysfunction with estimated moderately elevated pulmonary artery systolic pressure  Small circumferential pericardial effusion without frank tamponade  Mildly dilated ascending aorta.     CTA Chest:  Sub-optimal examination for segmental and subsegmental pulmonary embolism due to respiratory motion artifact.   No evidence of pulmonary embolism.      Extensive bilateral patchy and groundglass lung opacification, likely multifocal infection/inflammation with underlying mild edema. Follow-up chest imaging should be performed as clinically warranted.      Moderate to large pericardial effusion.      Cardiomegaly. Mild pulmonary artery enlargement. Reflux of IV contrast into the IVC and hepatic veins, suggestive of elevated right heart pressures.      Aortic valve calcification. Moderate left-sided coronary artery calcifications. These findings are advanced for patient's age. Clinical correlation is recommended.      Multiple borderline sized to mildly enlarged mediastinal and hilar lymph nodes, suspected to be reactive.     Chest X-Ray:  Extensive bilateral lung opacities, suspicious for pulmonary edema given cardiac silhouette enlargement. Superimposed infection  cannot be excluded in the appropriate clinical context. Underlying fibrotic lung disease cannot be excluded given the clinical   history.      Cardiac silhouette enlargement, could be due to cardiomegaly, however findings are concerning for pericardial effusion. Clinical/echocardiographic correlation is recommended.     Assessment     Jodi Holmes is a 44 y.o. female with medical history significant for lupus, pulmonary fibrosis, CHF, paroxysmal atrial fibrillation with RVR, CAD s/p LAD PCI, pericardial effusion, moderate aortic stenosis, diabetes mellitus, HTN, and HLD who presents in acute hypoxic respiratory failure. Etiology of respiratory failure unclear at this time. It is more likely pulmonary due to ground glass opacities on CT, relatively euvolemic exam, and history of both lupus and pulmonary fibrosis. Cardiogenic causes cannot be excluded at this time, however, due to elevated BNP, atrial fibrillation, and history of CHF. Patient continues to improve this morning, despite stopping antibiotics. Will continue IV steroids per rheum recs, consider tapering today based on improvement, and will continue diuresing for additional fluid removal.    Plan     Acute Hypoxic Respiratory Failure, Likely Secondary to Lupus Flare Significant improvement with steroids, elevated inflammatory markers, GGOs on CT, and very elevated ANA titers indicate autoimmune source  - Chest x-ray and CTA as above, notable for significant GGOs, no PE  - Currently on 2 L NC  - ESR/CRP 89/139, procal 0.21 on admission  - S/p 1 day of vanc/zosyn treatment  - Blood cultures NGTD, sputum cultures not sent for lack of organisms  - MRSA swab negative  - Aspergillus and beta-d-glucan antigens pending  - LDH 563, does not rule out pneumocystis pneumonia  - UA notable for 2+ protein, trace glucose and ketones, microscopy with 4+ squams  - urine legionella and strep pneumo antigens negative  - Pulmonology consult, appreciate their  assistance   - 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   - Continue 60 mg solumedrol IV Q12, consider dose reducing today   - Hold home Esbriet   - Diurese for goal net negative 1-2 L  - Rheumatology consult, appreciate their input   - ANA positive, titers 1280, nucleolar pattern, Anti RNP positive   -  C3, C4, ANCA, RF, Anti SSA/SSB, Anti Smith, Anti-dsDNA within  normal limits   - MPO pending   - Lupus anticoagulation panel notable for fibrinogen of 814, INR 1.5,  anti-cardiolipin antibody and beta-2-glycoprotein negative    Pericardial Effusion chronic, no tamponade physiology on presentation so unlikely acutely enlarging  - ECHO as above, size of effusion stable compared to prior exam from outside hospital  - S/p 40 mg IV lasix x2 on arrival, minimal symptomatic improvement  - Continue home colchicine 0.6 mg daily  - IV lasix 20 mg BID   - Strict I/O    Paroxysmal Atrial Fibrillation with RVR  - Metoprolol 100 mg BID  - ECHO as above  - Continue home diltiazem 240 mg daily   - Metoprolol 5 mg IV PRN for sustain HR >140  - Hold home Eliquis 5 mg BID    Lupus with secondary Pulmonary Fibrosis unclear if in flare at this time  - Rheumatology consulted, appreciate assistance  - ANA positive, titers 1280, nucleolar pattern, Anti RNP positive  - C3, C4, ANCA, RF, Anti SSA/SSB, Anti Smith, Anti-dsDNA within normal limits  - MPO pending  - ESR/CRP 89/139, procal 0.21  - Home plaquenil and azothioprine per rehumatology recs  - Hold home Esbriet  - Hypercoagulable state labs normal    Diabetes mellitus Type 2  - Continue lantus 15 units nightly  - Lispro SS, increase NB to 8  - POCT BGs with meals and nightly  - BGs in 200s past 24 hours  - Hold home Victoza    Chronic problems:  HTN: Increase home losartan to 75 mg daily  HLD: Continue home Crestor 20 mg daily  GERD: Home Protonix 40 mg daily  CAD: Hold home plavix, recent cardiology note indicates stopping    Labs: CBC, BMP daily  Diet: Consistent Carbohydrate  DVT ppx:  Contraindicated for [facility]     Discharge Planning:   Expected Discharge Date: 01/17/2020  Discharge Criteria/Barriers to Discharge: Oxygen Requirement  PT and/or OT Recommendations/Discharge to: Prior Living Arrangement  Post-discharge Appointments Needed with: PCP, Pulmonology, Rehumatology    Code Status: FULL CODE    Conni Elliot, MD  Internal Medicine PGY-1  [phone]

## 2020-01-14 NOTE — Progress Notes (Signed)
Rheumatology Progress Note    Interim history: feeling much better in past day  Up to take shower now and not short of breath  No cough  No fevers   Overall much improved     Physical Exam:  BP (!) 153/101 (BP Location: Right arm) Comment: RN EO notified   Pulse 97    Temp 36.9 C (98.4 F) (Temporal)    Resp 18    Ht 1.524 m (5')    Wt 104.1 kg (229 lb 6.4 oz)    SpO2 92%    BMI 44.80 kg/m   Gen: Ambulating without difficulty on 2.5L NC    Labs:  Recent Labs (72hrs):  Recent Labs     01/14/20  0102 01/13/20  0028 01/12/20  1614 01/12/20  0922   WBC 12.3* 9.7  --  9.3   Hemoglobin 10.6* 11.4 12.7 10.6*   Hematocrit 33* 34  --  34   MCV 81 79  --  80   Platelets 373* 357  --  335   INR  --  1.5*  --   --    Sedimentation Rate  --   --  89*  --    CRP  --   --  139*  --      Recent Labs     01/14/20  0102 01/13/20  1900 01/13/20  0028 01/12/20  0922   Sodium 138 138 137 141   Potassium 4.1 3.8 3.6 3.3   CO2 _0 Chloride 100 98 97 100   UN 22* _1 Glucose 243* 229* 245* 118*   Calcium 9.0 9.0 8.7* 9.0   Magnesium  --   --   --  1.5*   AST  --   --   --  28   ALT  --   --   --  17   Alk Phos  --   --   --  89   Total Protein  --   --   --  7.4   Bilirubin,Total  --   --   --  0.3     No components found with this basename: CR, PHOS    No results for input(s): UTPR, CREAU in the last 72 hours.    Rheum labs:  Lab Results   Component Value Date    ANAS POS (!) 01/12/2020    ANAT 1,280 (!) 01/12/2020    ANAP NUCLEOLAR 01/12/2020    DNAN 1 01/12/2020    SSA 0.2 01/12/2020    SSB <0.2 01/12/2020    ANRNP 4.3 (H) 01/12/2020    ANSM <0.2 01/12/2020    C3 149 01/12/2020    C4 24 01/12/2020    B2IGG <1.4 01/13/2020    B2IGM 1.5 01/13/2020    ACLIG <1.6 01/13/2020    ACLIM 0.4 01/13/2020    LUPR NEG 01/13/2020    ANCAS NEG 01/12/2020        Micro:  Aerobic Culture   Date Value Ref Range Status   01/13/2020 Lab Cancel  Final   01/12/2020 Lab Cancel  Final     Bacterial Blood Culture   Date Value Ref Range Status    01/12/2020 .  Preliminary   01/12/2020 .  Preliminary          Imaging:  No results found.    Assessment:  Margurete Guaman is a 44 y.o. female with history of mixed connective tissue disease  with positive RNP antibodies, ILD, arthralgias. Now with acute respiratory failure and imaging showing extensive GGOs - unclear if infectious etiology or her ILD or heart failure exacerbation. Regardless improving with steroids and antibiotics.     Recommendations  Taper her steroids to medrol 15m PO QAM starting tomorrow AM  Can discontinue her PM dosing of the IV medrol for today as well     CLuna Kitchens MD , Rheumatology Fellow, 01/14/2020 2:28 PM

## 2020-01-14 NOTE — Progress Notes (Signed)
Patient's chart reviewed for discharge planning needs. [facility] will continue to be available to coordinate discharge.    Reita Shindler, RN-BC  Acute Care Coordinator [id number]  Phone # [phone]  Pager # [phone]  Weekend Pager # [phone]

## 2020-01-14 NOTE — Progress Notes (Signed)
PULMONARY FOLLOW-UP    Subjective:   Reports feeling much better even than yesterday.  Notices this improvement when walking to and from the bathroom.  Says she urinated a lot after lasix.  Resting sat for me was 96% on 6L, 88-90% on 2L.  No new complaints.    Objective:  BP: (144-166)/(85-108)   Temp:  [36.3 C (97.3 F)-36.5 C (97.7 F)]   Temp src: Temporal (06/03 0852)  Heart Rate:  [76-95]   Resp:  [18-24]   SpO2:  [90 %-98 %]     Last set of vitals:  Vitals:    01/14/20 0852   BP:    Pulse: 82   Resp: 18   Temp: 36.4 C (97.5 F)   Weight:    Height:          Intake/Output Summary (Last 24 hours) at 01/14/2020 0950  Last data filed at 01/14/2020 0533  Gross per 24 hour   Intake 777.04 ml   Output    Net 777.04 ml         General: alert, NAD  HENT: MMM  CV: RRR, extremities warm, mild pitting edema to shins  Pulm: breathing comfortably, clear  Extremities: no overt joint abnormalities  Skin: no rashes or lesions  Neuro: alert, no overt focal deficits          Component Value Date/Time    NA 138 01/14/2020 0102    K 4.1 01/14/2020 0102    CL 100 01/14/2020 0102    CO2 26 01/14/2020 0102    UN 22 (H) 01/14/2020 0102    CREAT 0.85 01/14/2020 0102    GFRC 83 01/14/2020 0102    GFRB 96 01/14/2020 0102    GLU 243 (H) 01/14/2020 0102    CA 9.0 01/14/2020 0102    PO4 3.6 01/12/2020 0922          Component Value Date/Time    WBC 12.3 (H) 01/14/2020 0102    HGB 10.6 (L) 01/14/2020 0102    HCT 33 (L) 01/14/2020 0102    PLT 373 (H) 01/14/2020 0102      No results for input(s): TROP, CK, MCKMB, RI, BNP in the last 168 hours.     Radiology:  No results found.    Current Meds:   Current Facility-Administered Medications   Medication Dose Route Frequency    insulin lispro (HumaLOG,ADMELOG) injection 0-20 units  0-20 units Subcutaneous TID WC    phenol (CHLORASEPTIC) 1.4 % liquid LIQD 2 spray  2 spray Oral Q2H PRN    insulin glargine (LANTUS/SEMGLEE) injection 15 units  15 units Subcutaneous Nightly    losartan (COZAAR)  tablet 50 mg  50 mg Oral Daily    dilTIAZem (CARDIZEM CD, CARTIA XT, DILT-CD) 24 hr capsule 240 mg  240 mg Oral Daily    azaTHIOprine (IMURAN) tablet 50 mg  50 mg Oral BID    hydroxychloroquine (PLAQUENIL) tablet 200 mg  200 mg Oral BID    sodium chloride 0.9 % FLUSH REQUIRED IF PATIENT HAS IV  0-500 mL/hr Intravenous PRN    dextrose 5 % FLUSH REQUIRED IF PATIENT HAS IV  0-500 mL/hr Intravenous PRN    sodium chloride 0.9 % FLUSH REQUIRED IF PATIENT HAS IV  0-500 mL/hr Intravenous PRN    dextrose 5 % FLUSH REQUIRED IF PATIENT HAS IV  0-500 mL/hr Intravenous PRN    acetaminophen (TYLENOL) tablet 650 mg  650 mg Oral Q4H PRN    dextrose (GLUTOSE) 40 % oral  gel 15 g  15 g Oral PRN    And    dextrose 50% (0.5 g/mL) injection 25 g  25 g Intravenous PRN    And    glucagon (GLUCAGEN) injection 1 mg  1 mg Intramuscular PRN    colchicine tablet 0.6 mg  0.6 mg Oral Daily    rosuvastatin (CRESTOR) tablet 20 mg  20 mg Oral Daily    pantoprazole (PROTONIX) EC tablet 40 mg  40 mg Oral QAM    cetirizine (ZyrTEC) tablet 10 mg  10 mg Oral Daily    ipratropium-albuterol (DUONEB) 0.5-2.5mg  /66mL nebulization solution 3 mL  3 mL Nebulization 4x Daily PRN    methylPREDNISolone sodium succinate (SOLU-Medrol) 62.5 mg/mL injection 60 mg  60 mg Intravenous Q12H    metoprolol tartrate (LOPRESSOR) tablet 50 mg  50 mg Oral Q6H       Assessment/Plan:  Jodi Holmes is a 55 F with PMHx of SLE on HCQ and azathioprine, 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    She continues to improve on steroids and with diuresis.  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  I would continue to give her IV diuresis today as well.      Recommendations:  - continue methylprednisolone per AIR recs  - bdg and aspergillus ag pending  - would repeat IV diuresis with lasix 20mg  and repeat as needed for goal net -1-2L  - attempt strict I/o's  - continue to hold Esbriet  - we will continue to follow    Signed by , MD on 01/14/2020 at 9:50 AM  Pulmonary and critical care fellow

## 2020-01-15 LAB — CBC AND DIFFERENTIAL
Baso # K/uL: 0 10*3/uL (ref 0.0–0.1)
Basophil %: 0 %
Eos # K/uL: 0 10*3/uL (ref 0.0–0.4)
Eosinophil %: 0.1 %
Hematocrit: 36 % (ref 34–45)
Hemoglobin: 11 g/dL — ABNORMAL LOW (ref 11.2–15.7)
IMM Granulocytes #: 0.1 10*3/uL — ABNORMAL HIGH (ref 0.0–0.0)
IMM Granulocytes: 0.4 %
Lymph # K/uL: 1.2 10*3/uL (ref 1.2–3.7)
Lymphocyte %: 10.5 %
MCH: 25 pg — ABNORMAL LOW (ref 26–32)
MCHC: 31 g/dL — ABNORMAL LOW (ref 32–36)
MCV: 80 fL (ref 79–95)
Mono # K/uL: 1 10*3/uL — ABNORMAL HIGH (ref 0.2–0.9)
Monocyte %: 8.8 %
Neut # K/uL: 9.4 10*3/uL — ABNORMAL HIGH (ref 1.6–6.1)
Nucl RBC # K/uL: 0 10*3/uL (ref 0.0–0.0)
Nucl RBC %: 0.3 /100 WBC — ABNORMAL HIGH (ref 0.0–0.2)
Platelets: 428 10*3/uL — ABNORMAL HIGH (ref 160–370)
RBC: 4.4 MIL/uL (ref 3.9–5.2)
RDW: 15.4 % — ABNORMAL HIGH (ref 11.7–14.4)
Seg Neut %: 80.2 %
WBC: 11.8 10*3/uL — ABNORMAL HIGH (ref 4.0–10.0)

## 2020-01-15 LAB — BASIC METABOLIC PANEL
Anion Gap: 13 (ref 7–16)
CO2: 24 mmol/L (ref 20–28)
Calcium: 9.3 mg/dL (ref 8.8–10.2)
Chloride: 101 mmol/L (ref 96–108)
Creatinine: 0.72 mg/dL (ref 0.51–0.95)
GFR,Black: 117 *
GFR,Caucasian: 102 *
Glucose: 156 mg/dL — ABNORMAL HIGH (ref 60–99)
Lab: 21 mg/dL — ABNORMAL HIGH (ref 6–20)
Potassium: 3.5 mmol/L (ref 3.3–5.1)
Sodium: 138 mmol/L (ref 133–145)

## 2020-01-15 LAB — POCT GLUCOSE
Glucose POCT: 156 mg/dL — ABNORMAL HIGH (ref 60–99)
Glucose POCT: 273 mg/dL — ABNORMAL HIGH (ref 60–99)
Glucose POCT: 278 mg/dL — ABNORMAL HIGH (ref 60–99)
Glucose POCT: 228 mg/dL — ABNORMAL HIGH (ref 60–99)

## 2020-01-15 LAB — BETA-D GLUCAN
Beta-D glucan interpretation: NEGATIVE
Beta-D glucan: <31 pg/mL

## 2020-01-15 MED ORDER — POTASSIUM CHLORIDE CRYS CR 10 MEQ PO TBCR *I*
20.0000 meq | ORAL_TABLET | Freq: Once | ORAL | Status: AC
Start: 2020-01-15 — End: 2020-01-15
  Administered 2020-01-15: 20 meq via ORAL
  Filled 2020-01-15: qty 2

## 2020-01-15 MED ORDER — LOSARTAN POTASSIUM 50 MG PO TABS *I*
100.0000 mg | ORAL_TABLET | Freq: Every day | ORAL | Status: DC
Start: 2020-01-15 — End: 2020-01-17
  Administered 2020-01-15 – 2020-01-17 (×3): 100 mg via ORAL
  Filled 2020-01-15 (×4): qty 2

## 2020-01-15 MED ORDER — INSULIN GLARGINE 100 UNIT/ML SC SOLN *WRAPPED*
20.0000 [IU] | Freq: Every evening | SUBCUTANEOUS | Status: DC
Start: 2020-01-15 — End: 2020-01-17
  Administered 2020-01-15 – 2020-01-16 (×2): 20 [IU] via SUBCUTANEOUS

## 2020-01-15 MED ORDER — FUROSEMIDE 10 MG/ML IJ SOLN *I*
20.0000 mg | Freq: Once | INTRAMUSCULAR | Status: AC
Start: 2020-01-15 — End: 2020-01-15
  Administered 2020-01-15: 20 mg via INTRAVENOUS
  Filled 2020-01-15: qty 4

## 2020-01-15 MED ORDER — POTASSIUM CHLORIDE CRYS CR 10 MEQ PO TBCR *I*
20.0000 meq | ORAL_TABLET | ORAL | Status: AC
Start: 2020-01-15 — End: 2020-01-15
  Administered 2020-01-15 (×2): 20 meq via ORAL
  Filled 2020-01-15 (×3): qty 2

## 2020-01-15 MED ORDER — TORSEMIDE 20 MG PO TABS *I*
20.0000 mg | ORAL_TABLET | Freq: Two times a day (BID) | ORAL | Status: DC
Start: 2020-01-15 — End: 2020-01-17
  Administered 2020-01-15 – 2020-01-17 (×4): 20 mg via ORAL
  Filled 2020-01-15 (×5): qty 1

## 2020-01-15 NOTE — Progress Notes (Signed)
Hospital Medicine Progress Note    LOS: 3 days                                                Significant 24 Hour Events      No acute events overnight.    Subjective      Patient is feeling much better this morning. She is lying in bed on 3 L, only occasionally coughing. She feels a similar comfort level today as yesterday.    Objective      Vitals:  Temp:  [36.1 C (97 F)-37.2 C (99 F)] 36.8 C (98.2 F)  Heart Rate:  [82-100] 100  Resp:  [18-20] 20  BP: (147-188)/(90-111) 147/90    Physical Exam  General Constitutional: Patient calm, pleasant, cooperative, in no acute distress  HEENT: MMM, no tonsillar erythema, no ulcerations or erosions, PERRL, no LAD or thyromegaly  Cardiovascular: Irregular rhythm, regular rate, normal S1/S2 no MRG, no obvious JVD  Pulmonary: Normal work of breathing, diffuse, dry crackles posteriorly, improving  Gastrointestinal: active bowel sounds, non-tender to palpation, non-distended  Extremities: trace pitting edema bilaterally, brisk peripheral pulses  Neurologic: AOx3, no FND   Skin: no rashes    Laboratory Data:  Recent Labs   Lab 01/15/20  0026 01/14/20  0102 01/13/20  0028   WBC 11.8* 12.3* 9.7   Hemoglobin 11.0* 10.6* 11.4   Hematocrit 36 33* 34   Platelets 428* 373* 357   MCV 80 81 79   RDW 15.4* 15.7* 15.2*     Recent Labs   Lab 01/15/20  0026 01/14/20  0102 01/13/20  1900 01/13/20  0028 01/12/20  0922   Sodium 138 138 138   < > 141   Potassium 3.5 4.1 3.8   < > 3.3   Chloride 101 100 98   < > 100   CO2 '24 26 22   ' < > 26   Anion Gap 13 12 18*   < > 15   UN 21* 22* 19   < > 11   Creatinine 0.72 0.85 0.85   < > 0.70   Glucose 156* 243* 229*   < > 118*   Calcium 9.3 9.0 9.0   < > 9.0   Phosphorus  --   --   --   --  3.6   Magnesium  --   --   --   --  1.5*    < > = values in this interval not displayed.         Microbiology:   COVID negative  Influenza A/B negative  RSV negative  Blood Cultures NGTD  Sputum Cultures Pending  Aspergillus Antigen pending  Strep Pneumo Antigen  negative  Legionella Antigen negative    Relevant Imaging:   ECHO:  Technically difficult study due to body habitus / poor acoustic windows   Concentric LVH with abnormal diastolic function and left atrial enlargement.  Normal LVEF without significant wall motion abnormalities. Aortic valve sclerosis with estimated moderate stenosis  Right heart enlargement/dysfunction with estimated moderately elevated pulmonary artery systolic pressure  Small circumferential pericardial effusion without frank tamponade  Mildly dilated ascending aorta.     CTA Chest:  Sub-optimal examination for segmental and subsegmental pulmonary embolism due to respiratory motion artifact.   No evidence of pulmonary embolism.      Extensive bilateral  patchy and groundglass lung opacification, likely multifocal infection/inflammation with underlying mild edema. Follow-up chest imaging should be performed as clinically warranted.      Moderate to large pericardial effusion.      Cardiomegaly. Mild pulmonary artery enlargement. Reflux of IV contrast into the IVC and hepatic veins, suggestive of elevated right heart pressures.      Aortic valve calcification. Moderate left-sided coronary artery calcifications. These findings are advanced for patient's age. Clinical correlation is recommended.      Multiple borderline sized to mildly enlarged mediastinal and hilar lymph nodes, suspected to be reactive.     Chest X-Ray:  Extensive bilateral lung opacities, suspicious for pulmonary edema given cardiac silhouette enlargement. Superimposed infection cannot be excluded in the appropriate clinical context. Underlying fibrotic lung disease cannot be excluded given the clinical   history.      Cardiac silhouette enlargement, could be due to cardiomegaly, however findings are concerning for pericardial effusion. Clinical/echocardiographic correlation is recommended.     Assessment     Jodi Holmes is a 44 y.o. female with medical history  significant for lupus, pulmonary fibrosis, CHF, paroxysmal atrial fibrillation with RVR, CAD s/p LAD PCI, pericardial effusion, moderate aortic stenosis, diabetes mellitus, HTN, and HLD who presents in acute hypoxic respiratory failure. Etiology of respiratory failure unclear at this time. It is more likely pulmonary due to ground glass opacities on CT, relatively euvolemic exam, and history of both lupus and pulmonary fibrosis. Cardiogenic causes cannot be excluded at this time, however, due to elevated BNP, atrial fibrillation, and history of CHF. Patient is stable this morning after beginning steroid taper. Continue today, with transition to PO. Will also transition to PO diuretics this afternoon.    Plan     Acute Hypoxic Respiratory Failure, Likely Secondary to Lupus Flare Significant improvement with steroids, elevated inflammatory markers, GGOs on CT, and very elevated ANA titers indicate autoimmune source  - Chest x-ray and CTA as above, notable for significant GGOs, no PE  - Currently on 3 L NC  - ESR/CRP 89/139, procal 0.21 on admission  - S/p 1 day of vanc/zosyn treatment  - Blood cultures NGTD, sputum cultures not sent for lack of organisms  - MRSA swab negative  - Aspergillus and beta-d-glucan antigens pending  - LDH 563, does not rule out pneumocystis pneumonia  - UA notable for 2+ protein, trace glucose and ketones, microscopy with 4+ squams  - urine legionella and strep pneumo antigens negative  - Pulmonology consult, appreciate their assistance   - Concern for DAH   - Medrol 64 mg PO daily, taper per rheum recs   - Hold home Esbriet   - Diurese for goal net negative 1-2 L  - Rheumatology consult, appreciate their input   - ANA positive, titers 1280, nucleolar pattern, Anti RNP positive   - C3, C4, ANCA, RF, Anti SSA/SSB, Anti Smith, Anti-dsDNA within  normal limits   - MPO pending   - Lupus anticoagulation panel notable for fibrinogen of 814, INR 1.5,  anti-cardiolipin antibody and  beta-2-glycoprotein negative    Pericardial Effusion chronic, no tamponade physiology on presentation so unlikely acutely enlarging  - ECHO as above, size of effusion stable compared to prior exam from outside hospital  - S/p 40 mg IV lasix x2 on arrival, minimal symptomatic improvement  - Continue home colchicine 0.6 mg every other day, reduced for diarrhea  - IV lasix 20 mg in AM, switch to PO torsemide for PM dose  - Strict I/O,  net negative 1 L yesterday    Paroxysmal Atrial Fibrillation with RVR  - Metoprolol 100 mg BID  - ECHO as above  - Continue home diltiazem 240 mg daily   - Metoprolol 5 mg IV PRN for sustain HR >140  - Hold home Eliquis 5 mg BID, consult with pulm for appropriate restart time    Lupus with secondary Pulmonary Fibrosis unclear if in flare at this time  - Rheumatology consulted, appreciate assistance  - ANA positive, titers 1280, nucleolar pattern, Anti RNP positive  - C3, C4, ANCA, RF, Anti SSA/SSB, Anti Smith, Anti-dsDNA within normal limits  - MPO pending  - ESR/CRP 89/139, procal 0.21  - Home plaquenil and azothioprine per rehumatology recs  - Hold home Esbriet  - Hypercoagulable state labs normal    Diabetes mellitus Type 2  - Increase lantus to home 20 units nightly  - Lispro SS, continue NB 8, consider increasing  - POCT BGs with meals and nightly  - BGs in 200s past 24 hours  - Hold home Victoza    Chronic problems:  HTN: Increase home losartan to 100 mg daily  HLD: Continue home Crestor 20 mg daily  GERD: Home Protonix 40 mg daily  CAD: Hold home plavix, recent cardiology note indicates stopping    Labs: CBC, BMP daily  Diet: Consistent Carbohydrate  DVT ppx: Contraindicated for Possible [facility]     Discharge Planning:   Expected Discharge Date: 01/17/2020  Discharge Criteria/Barriers to Discharge: Oxygen Requirement  PT and/or OT Recommendations/Discharge to: Prior Living Arrangement  Post-discharge Appointments Needed with: PCP, Pulmonology, Rehumatology    Code Status: FULL  CODE    Conni Elliot, MD  Internal Medicine PGY-1  [phone]

## 2020-01-15 NOTE — Telephone (Signed)
Family Update:     Spoke with husband, Jodi Holmes. He was updated regarding the patient's current status. He is aware of our current treatment plan and recommendations. We discussed that the plan is to transition to oral steroids today and monitor Jodi Holmes's progress.     All of his questions were answered.      He knows to call the unit with any questions or concerns.     [name], MD  Internal Medicine PGY-1  01/15/2020 11:46 AM  [phone]

## 2020-01-15 NOTE — Progress Notes (Signed)
Rheumatology Progress Note    Interim history: Currently feels dyspnea is improved and she doesn't have orthopnea. On 3L at 95% weaned from 6L. She was able to take shower and denies any hemoptysis. Coughing also has improved     Physical Exam:  BP 152/83 (BP Location: Left arm)    Pulse 95    Temp 36.5 C (97.7 F) (Temporal)    Resp 20    Ht 1.524 m (5')    Wt 103.4 kg (228 lb)    SpO2 95%    BMI 44.53 kg/m    General: well appearing, NC/AT  HEENT: no oral or nasal ulcers  Heart: RRR, S1, S2 present, no murmurs or rubs heard  Lungs: b/l crackles which have decreased at the bases   Abdomen: + BS heard, ND, NT to palpation  Extremities: no rash and small finger joints and feet without swelling, no sclerodactyly present nor puffy fingers.       Labs:  Recent Labs (72hrs):  Recent Labs     01/15/20  0026 01/14/20  0102 01/13/20  0028 01/12/20  1614   WBC 11.8* 12.3* 9.7  --    Hemoglobin 11.0* 10.6* 11.4 12.7   Hematocrit 36 33* 34  --    MCV 80 81 79  --    Platelets 428* 373* 357  --    INR  --   --  1.5*  --    Sedimentation Rate  --   --   --  89*   CRP  --   --   --  139*     Recent Labs     01/15/20  0026 01/14/20  0102 01/13/20  1900 01/13/20  0028   Sodium 138 138 138 137   Potassium 3.5 4.1 3.8 3.6   CO2 24 26 22 24    Chloride 101 100 98 97   UN 21* 22* 19 17   Glucose 156* 243* 229* 245*   Calcium 9.3 9.0 9.0 8.7*     No components found with this basename: CR, PHOS    No results for input(s): UTPR, CREAU in the last 72 hours.    Rheum labs:  Lab Results   Component Value Date    ANAS POS (!) 01/12/2020    ANAT 1,280 (!) 01/12/2020    ANAP NUCLEOLAR 01/12/2020    DNAN 1 01/12/2020    SSA 0.2 01/12/2020    SSB <0.2 01/12/2020    ANRNP 4.3 (H) 01/12/2020    ANSM <0.2 01/12/2020    C3 149 01/12/2020    C4 24 01/12/2020    B2IGG <1.4 01/13/2020    B2IGM 1.5 01/13/2020    ACLIG <1.6 01/13/2020    ACLIM 0.4 01/13/2020    LUPR NEG 01/13/2020    ANCAS NEG 01/12/2020        Micro:  Aerobic Culture   Date Value Ref  Range Status   01/13/2020 Lab Cancel  Final   01/12/2020 Lab Cancel  Final     Bacterial Blood Culture   Date Value Ref Range Status   01/12/2020 .  Preliminary   01/12/2020 .  Preliminary          Imaging:  No results found.    Assessment:  Jodi Holmes is a 44 y.o. 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 She is being empirically treated with steroids and receiving diuresis which she is improving.     Recommendations  Decrease steroids and change to prednisone 40mg  for 1 week, decrease to 20mg  for 1 week, 10mg  for 1 week and 5mg  for 1 week  Continue azathioprine and plaquenil   We will arrange followup for her in 2 weeks     Frederich Balding, MD , Rheumatology Fellow, 01/15/2020 3:26 PM

## 2020-01-15 NOTE — Progress Notes (Signed)
PULMONARY FOLLOW-UP    Subjective:   Noted in AIR dx is mixed connective tissue disease.  Weaned to 3L this AM.  SpO2 nadir 89% on RA for me.  Cough better again this AM.  Dyspnea maybe slightly better to the same.  Thinks swelling somewhat decreased.  No new complaints.    Objective:  BP: (147-188)/(75-111)   Temp:  [36.1 C (97 F)-37.6 C (99.7 F)]   Temp src: Oral (06/04 0811)  Heart Rate:  [85-102]   Resp:  [18-20]   SpO2:  [90 %-100 %]   Weight:  [103.4 kg (228 lb)]     Last set of vitals:  Vitals:    01/15/20 0928   BP:    Pulse:    Resp:    Temp:    Weight: 103.4 kg (228 lb)   Height:          Intake/Output Summary (Last 24 hours) at 01/15/2020 1210  Last data filed at 01/15/2020 0830  Gross per 24 hour   Intake 840 ml   Output 2350 ml   Net -1510 ml         General: alert, NAD  HENT: MMM  CV: RRR, extremities warm, mild pitting edema to shins  Pulm: breathing comfortably, bibasilar crackles  Extremities: no overt joint abnormalities  Skin: no rashes or lesions  Neuro: alert, no overt focal deficits          Component Value Date/Time    NA 138 01/15/2020 0026    K 3.5 01/15/2020 0026    CL 101 01/15/2020 0026    CO2 24 01/15/2020 0026    UN 21 (H) 01/15/2020 0026    CREAT 0.72 01/15/2020 0026    GFRC 102 01/15/2020 0026    GFRB 117 01/15/2020 0026    GLU 156 (H) 01/15/2020 0026    CA 9.3 01/15/2020 0026    PO4 3.6 01/12/2020 0922          Component Value Date/Time    WBC 11.8 (H) 01/15/2020 0026    HGB 11.0 (L) 01/15/2020 0026    HCT 36 01/15/2020 0026    PLT 428 (H) 01/15/2020 0026      No results for input(s): TROP, CK, MCKMB, RI, BNP in the last 168 hours.     Radiology:  No results found.    Current Meds:   Current Facility-Administered Medications   Medication Dose Route Frequency    potassium chloride SA (KLOR-CON M10)  tablet 20 mEq  20 mEq Oral Q2H    insulin glargine (LANTUS/SEMGLEE) injection 20 units  20 units Subcutaneous Nightly    losartan (COZAAR) tablet 100 mg  100 mg Oral Daily     torsemide (DEMADEX) tablet 20 mg  20 mg Oral BID    insulin lispro (HumaLOG,ADMELOG) injection 0-20 units  0-20 units Subcutaneous TID WC    metoprolol tartrate (LOPRESSOR) tablet 100 mg  100 mg Oral 2 times per day    [START ON 01/16/2020] colchicine tablet 0.6 mg  0.6 mg Oral Q48H    methylPREDNISolone (MEDROL) tablet 64 mg  64 mg Oral Daily    phenol (CHLORASEPTIC) 1.4 % liquid LIQD 2 spray  2 spray Oral Q2H PRN    dilTIAZem (CARDIZEM CD, CARTIA XT, DILT-CD) 24 hr capsule 240 mg  240 mg Oral Daily    azaTHIOprine (IMURAN) tablet 50 mg  50 mg Oral BID    hydroxychloroquine (PLAQUENIL) tablet 200 mg  200 mg Oral BID    sodium  chloride 0.9 % FLUSH REQUIRED IF PATIENT HAS IV  0-500 mL/hr Intravenous PRN    dextrose 5 % FLUSH REQUIRED IF PATIENT HAS IV  0-500 mL/hr Intravenous PRN    sodium chloride 0.9 % FLUSH REQUIRED IF PATIENT HAS IV  0-500 mL/hr Intravenous PRN    dextrose 5 % FLUSH REQUIRED IF PATIENT HAS IV  0-500 mL/hr Intravenous PRN    acetaminophen (TYLENOL) tablet 650 mg  650 mg Oral Q4H PRN    dextrose (GLUTOSE) 40 % oral gel 15 g  15 g Oral PRN    And    dextrose 50% (0.5 g/mL) injection 25 g  25 g Intravenous PRN    And    glucagon (GLUCAGEN) injection 1 mg  1 mg Intramuscular PRN    rosuvastatin (CRESTOR) tablet 20 mg  20 mg Oral Daily    pantoprazole (PROTONIX) EC tablet 40 mg  40 mg Oral QAM    cetirizine (ZyrTEC) tablet 10 mg  10 mg Oral Daily    ipratropium-albuterol (DUONEB) 0.5-2.5mg  /81mL nebulization solution 3 mL  3 mL Nebulization 4x Daily PRN       Assessment/Plan:  Jodi Holmes is a 89 F with PMHx of mixed connective tissue disease on HCQ and azathioprine, ILD with possible early fibrosis on esbriet, afib, DM and previous MI s/p PCI who presented to the hospital with increased dyspnea, cough and hypoxemia found on CT chest to have diffuse peribronchovascular ground glass infiltrates and pericardial effusion in the background of possible early lung fibrosis concerning for  ILD flare with DAH vs pulmonary edema.  She is clinically improving with steroids and diuresis.      Recommendations:  - recommend continuing to hold eliquis  - continue steroids as recommended by AIR  - bdg and aspergillus ag pending  - would repeat IV diuresis today for goal net -1-2L  - continue to hold Esbriet      Signed by Hiram Comber, MD on 01/15/2020 at 12:10 PM  Pulmonary and critical care fellow

## 2020-01-15 NOTE — Progress Notes (Signed)
Patient's chart reviewed for discharge planning needs. [facility] will continue to be available to coordinate discharge.    Terrisa Curfman, RN-BC  Acute Care Coordinator [id number]  Phone # [phone]  Pager # [phone]  Weekend Pager # [phone]

## 2020-01-16 LAB — CBC AND DIFFERENTIAL
Baso # K/uL: 0 10*3/uL (ref 0.0–0.1)
Basophil %: 0.1 %
Eos # K/uL: 0 10*3/uL (ref 0.0–0.4)
Eosinophil %: 0 %
Hematocrit: 33 % — ABNORMAL LOW (ref 34–45)
Hemoglobin: 10.3 g/dL — ABNORMAL LOW (ref 11.2–15.7)
IMM Granulocytes #: 0.1 10*3/uL — ABNORMAL HIGH (ref 0.0–0.0)
IMM Granulocytes: 0.6 %
Lymph # K/uL: 0.8 10*3/uL — ABNORMAL LOW (ref 1.2–3.7)
Lymphocyte %: 9.5 %
MCH: 25 pg — ABNORMAL LOW (ref 26–32)
MCHC: 31 g/dL — ABNORMAL LOW (ref 32–36)
MCV: 80 fL (ref 79–95)
Mono # K/uL: 0.9 10*3/uL (ref 0.2–0.9)
Monocyte %: 10.9 %
Neut # K/uL: 6.6 10*3/uL — ABNORMAL HIGH (ref 1.6–6.1)
Nucl RBC # K/uL: 0 10*3/uL (ref 0.0–0.0)
Nucl RBC %: 0 /100 WBC (ref 0.0–0.2)
Platelets: 412 10*3/uL — ABNORMAL HIGH (ref 160–370)
RBC: 4.1 MIL/uL (ref 3.9–5.2)
RDW: 15.2 % — ABNORMAL HIGH (ref 11.7–14.4)
Seg Neut %: 78.9 %
WBC: 8.4 10*3/uL (ref 4.0–10.0)

## 2020-01-16 LAB — BASIC METABOLIC PANEL
Anion Gap: 15 (ref 7–16)
CO2: 24 mmol/L (ref 20–28)
Calcium: 8.5 mg/dL — ABNORMAL LOW (ref 8.8–10.2)
Chloride: 98 mmol/L (ref 96–108)
Creatinine: 0.79 mg/dL (ref 0.51–0.95)
GFR,Black: 105 *
GFR,Caucasian: 91 *
Glucose: 311 mg/dL — ABNORMAL HIGH (ref 60–99)
Lab: 19 mg/dL (ref 6–20)
Potassium: 4.2 mmol/L (ref 3.3–5.1)
Sodium: 137 mmol/L (ref 133–145)

## 2020-01-16 LAB — POCT GLUCOSE
Glucose POCT: 139 mg/dL — ABNORMAL HIGH (ref 60–99)
Glucose POCT: 237 mg/dL — ABNORMAL HIGH (ref 60–99)
Glucose POCT: 293 mg/dL — ABNORMAL HIGH (ref 60–99)
Glucose POCT: 274 mg/dL — ABNORMAL HIGH (ref 60–99)

## 2020-01-16 MED ORDER — PREDNISONE 20 MG PO TABS *I*
40.0000 mg | ORAL_TABLET | Freq: Every day | ORAL | Status: DC
Start: 2020-01-16 — End: 2020-01-17
  Administered 2020-01-16 – 2020-01-17 (×2): 40 mg via ORAL
  Filled 2020-01-16 (×2): qty 2

## 2020-01-16 MED ORDER — PREDNISONE 10 MG PO TABS *I*
10.0000 mg | ORAL_TABLET | Freq: Every day | ORAL | Status: DC
Start: 2020-01-30 — End: 2020-01-17

## 2020-01-16 MED ORDER — INSULIN LISPRO (HUMAN) 100 UNIT/ML IJ/SC SOLN *WRAPPED*
0.0000 [IU] | Freq: Three times a day (TID) | SUBCUTANEOUS | Status: DC
Start: 2020-01-16 — End: 2020-01-17
  Administered 2020-01-16: 10 [IU] via SUBCUTANEOUS
  Administered 2020-01-16: 13 [IU] via SUBCUTANEOUS
  Administered 2020-01-16: 14 [IU] via SUBCUTANEOUS
  Administered 2020-01-17: 11 [IU] via SUBCUTANEOUS

## 2020-01-16 MED ORDER — PREDNISONE 20 MG PO TABS *I*
20.0000 mg | ORAL_TABLET | Freq: Every day | ORAL | Status: DC
Start: 2020-01-23 — End: 2020-01-17

## 2020-01-16 MED ORDER — PREDNISONE 5 MG PO TABS *I*
5.0000 mg | ORAL_TABLET | Freq: Every day | ORAL | Status: DC
Start: 2020-02-06 — End: 2020-01-17

## 2020-01-16 NOTE — Progress Notes (Addendum)
Attempt to perform Ambulation O2 sat off O2    Resting o2sat RA to 87%  Applied o2 at 1L NC  Resting o2sat on 1L NC 94%  Ambulation o2sat on 1L NC 90% for 161ft  Resting rebound on 1L NC after 2 minutes 94%  Marisue Brooklyn, LPN

## 2020-01-16 NOTE — Telephone (Signed)
Family Update:     Spoke with husband, Prentiss. He was updated regarding the patient's current status. He is aware of our current treatment plan and recommendations. We discussed that the plan is to continue oral steroids and oral diuretics, and to follow up with rheumatology [place] in [place].     All of his questions were answered.      He knows to call the unit with any questions or concerns.     [name], MD  Internal Medicine PGY-1  01/16/2020 10:31 AM  [phone]

## 2020-01-16 NOTE — Plan of Care (Signed)
Problem: Safety  Goal: Patient will remain free of falls  Outcome: Progressing towards goal  Goal: Prevent any intentional injury  Outcome: Progressing towards goal     Problem: Pain/Comfort  Goal: Patient's pain or discomfort is manageable  Outcome: Progressing towards goal     Problem: Nutrition  Goal: Nutritional status is maintained or improved - Geriatric  Outcome: Progressing towards goal     Problem: Mobility  Goal: Functional status is maintained or improved - Geriatric  Outcome: Progressing towards goal     Problem: Psychosocial  Goal: Demonstrates ability to cope with illness  Outcome: Progressing towards goal     Problem: Cognitive function  Goal: Cognitive function will be maintained or return to baseline  Outcome: Progressing towards goal     Problem: Potential Alteration in Skin Integrity - Pressure Ulcer  Goal: The patient will maintain intact skin free of pressure ulcer  Outcome: Progressing towards goal

## 2020-01-16 NOTE — Progress Notes (Signed)
Hospital Medicine Progress Note    LOS: 4 days                                                Significant 24 Hour Events      No acute events overnight.    Subjective      Patient is feeling much better this morning. She is lying nearly flat in bed on 3 L, and says she is coughing much less frequently. She feels like her breathing continues to improve.    Objective      Vitals:  Temp:  [36.5 C (97.7 F)-37.6 C (99.7 F)] 37 C (98.6 F)  Heart Rate:  [74-102] 89  Resp:  [19-20] 20  BP: (113-160)/(54-95) 149/89    Physical Exam  General Constitutional: Patient calm, pleasant, cooperative, in no acute distress  HEENT: MMM, no tonsillar erythema, no ulcerations or erosions, PERRL, no LAD or thyromegaly  Cardiovascular: Irregular rhythm, regular rate, normal S1/S2 no MRG, no obvious JVD  Pulmonary: Normal work of breathing, diffuse, dry crackles posteriorly, improving  Gastrointestinal: active bowel sounds, non-tender to palpation, non-distended  Extremities: trace pitting edema bilaterally, brisk peripheral pulses  Neurologic: AOx3, no FND   Skin: no rashes    Laboratory Data:  Recent Labs   Lab 01/16/20  0057 01/15/20  0026 01/14/20  0102   WBC 8.4 11.8* 12.3*   Hemoglobin 10.3* 11.0* 10.6*   Hematocrit 33* 36 33*   Platelets 412* 428* 373*   MCV 80 80 81   RDW 15.2* 15.4* 15.7*     Recent Labs   Lab 01/16/20  0057 01/15/20  0026 01/14/20  0102 01/13/20  0028 01/12/20  0922   Sodium 137 138 138   < > 141   Potassium 4.2 3.5 4.1   < > 3.3   Chloride 98 101 100   < > 100   CO2 _0 < > 26   Anion Gap _1 < > 15   UN 19 21* 22*   < > 11   Creatinine 0.79 0.72 0.85   < > 0.70   Glucose 311* 156* 243*   < > 118*   Calcium 8.5* 9.3 9.0   < > 9.0   Phosphorus  --   --   --   --  3.6   Magnesium  --   --   --   --  1.5*    < > = values in this interval not displayed.         Microbiology:   COVID negative  Influenza A/B negative  RSV negative  Blood Cultures NGTD  Sputum Cultures Not sent  Aspergillus  Antigen pending  Strep Pneumo Antigen negative  Legionella Antigen negative    Relevant Imaging:   ECHO:  Technically difficult study due to body habitus / poor acoustic windows   Concentric LVH with abnormal diastolic function and left atrial enlargement.  Normal LVEF without significant wall motion abnormalities. Aortic valve sclerosis with estimated moderate stenosis  Right heart enlargement/dysfunction with estimated moderately elevated pulmonary artery systolic pressure  Small circumferential pericardial effusion without frank tamponade  Mildly dilated ascending aorta.     CTA Chest:  Sub-optimal examination for segmental and subsegmental pulmonary embolism due to respiratory motion artifact.   No evidence of pulmonary embolism.  Extensive bilateral patchy and groundglass lung opacification, likely multifocal infection/inflammation with underlying mild edema. Follow-up chest imaging should be performed as clinically warranted.      Moderate to large pericardial effusion.      Cardiomegaly. Mild pulmonary artery enlargement. Reflux of IV contrast into the IVC and hepatic veins, suggestive of elevated right heart pressures.      Aortic valve calcification. Moderate left-sided coronary artery calcifications. These findings are advanced for patient's age. Clinical correlation is recommended.      Multiple borderline sized to mildly enlarged mediastinal and hilar lymph nodes, suspected to be reactive.     Chest X-Ray:  Extensive bilateral lung opacities, suspicious for pulmonary edema given cardiac silhouette enlargement. Superimposed infection cannot be excluded in the appropriate clinical context. Underlying fibrotic lung disease cannot be excluded given the clinical   history.      Cardiac silhouette enlargement, could be due to cardiomegaly, however findings are concerning for pericardial effusion. Clinical/echocardiographic correlation is recommended.     Assessment     Jodi Holmes is a 44 y.o.  female with medical history significant for lupus, pulmonary fibrosis, CHF, paroxysmal atrial fibrillation with RVR, CAD s/p LAD PCI, pericardial effusion, moderate aortic stenosis, diabetes mellitus, HTN, and HLD who presents in acute hypoxic respiratory failure. Etiology of respiratory failure unclear at this time. It is more likely pulmonary due to ground glass opacities on CT, relatively euvolemic exam, and history of both lupus and pulmonary fibrosis. Cardiogenic causes cannot be excluded at this time, however, due to elevated BNP, atrial fibrillation, and history of CHF. Patient is stable this morning after beginning steroid taper. Continue today, per rheumatology recommendations. She responded well to PO diuresis, will continue for discharge.    Plan     Acute Hypoxic Respiratory Failure, Likely Secondary to Lupus Flare Significant improvement with steroids, elevated inflammatory markers, GGOs on CT, and very elevated ANA titers indicate autoimmune source  - Chest x-ray and CTA as above, notable for significant GGOs, no PE  - Currently on 3 L NC  - ESR/CRP 89/139, procal 0.21 on admission  - S/p 1 day of vanc/zosyn treatment  - Blood cultures NGTD, sputum cultures not sent for lack of organisms  - MRSA swab negative  - Aspergillus pending, beta-d-glucan antigen negative  - LDH 563, does not rule out pneumocystis pneumonia  - UA notable for 2+ protein, trace glucose and ketones, microscopy with 4+ squams  - urine legionella and strep pneumo antigens negative  - Pulmonology consult, appreciate their assistance   - Concern for DAH   - Medrol 40 mg PO daily for 7 days, taper per rheum recs   - Hold home Esbriet   - Diurese for goal net negative 1-2 L  - Rheumatology consult, appreciate their input   - ANA positive, titers 1280, nucleolar pattern, Anti RNP positive   - C3, C4, ANCA, RF, Anti SSA/SSB, Anti Smith, Anti-dsDNA within  normal limits   - MPO pending   - Lupus anticoagulation panel notable for fibrinogen  of 814, INR 1.5,  anti-cardiolipin antibody and beta-2-glycoprotein negative    Pericardial Effusion chronic, no tamponade physiology on presentation so unlikely acutely enlarging  - ECHO as above, size of effusion stable compared to prior exam from outside hospital  - S/p 40 mg IV lasix x2 on arrival, minimal symptomatic improvement  - Continue home colchicine 0.6 mg every other day, reduced for diarrhea  - Torsemide 20 mg PO BID, patient responded well, may decrease dose  for discharge  - Strict I/O, net negative 2.5 L yesterday    Paroxysmal Atrial Fibrillation with RVR  - Metoprolol 100 mg BID, consider switch to Coreg for BP control  - ECHO as above  - Continue home diltiazem 240 mg daily   - Metoprolol 5 mg IV PRN for sustain HR >140  - Hold home Eliquis 5 mg BID, consult with pulm for appropriate restart time    Lupus with secondary Pulmonary Fibrosis unclear if in flare at this time  - Rheumatology consulted, appreciate assistance  - ANA positive, titers 1280, nucleolar pattern, Anti RNP positive  - C3, C4, ANCA, RF, Anti SSA/SSB, Anti Smith, Anti-dsDNA within normal limits  - MPO pending  - ESR/CRP 89/139, procal 0.21  - Home plaquenil and azothioprine per rehumatology recs  - Hold home Esbriet  - Hypercoagulable state labs normal    Diabetes mellitus Type 2  - Continue lantus home 20 units nightly  - Lispro SS, increase NB to 10, consider increasing further as needed  - POCT BGs with meals and nightly  - BGs in 200s past 24 hours  - Hold home Victoza    Chronic problems:  HTN: Continue losartan 100 mg daily  HLD: Continue home Crestor 20 mg daily  GERD: Home Protonix 40 mg daily  CAD: Hold home plavix, recent cardiology note indicates stopping    Labs: CBC, BMP daily  Diet: Consistent Carbohydrate  DVT ppx: Contraindicated for Possible [facility]     Discharge Planning:   Expected Discharge Date: 01/17/2020  Discharge Criteria/Barriers to Discharge: Oxygen Requirement  PT and/or OT Recommendations/Discharge to:  Prior Living Arrangement  Post-discharge Appointments Needed with: PCP, Pulmonology, Rehumatology    Code Status: FULL CODE    Conni Elliot, MD  Internal Medicine PGY-1  [phone]

## 2020-01-17 ENCOUNTER — Other Ambulatory Visit: Payer: Self-pay

## 2020-01-17 LAB — BASIC METABOLIC PANEL
Anion Gap: 15 (ref 7–16)
CO2: 26 mmol/L (ref 20–28)
Calcium: 8.8 mg/dL (ref 8.8–10.2)
Chloride: 98 mmol/L (ref 96–108)
Creatinine: 0.75 mg/dL (ref 0.51–0.95)
GFR,Black: 112 *
GFR,Caucasian: 97 *
Glucose: 192 mg/dL — ABNORMAL HIGH (ref 60–99)
Lab: 20 mg/dL (ref 6–20)
Potassium: 3.8 mmol/L (ref 3.3–5.1)
Sodium: 139 mmol/L (ref 133–145)

## 2020-01-17 LAB — CBC AND DIFFERENTIAL
Baso # K/uL: 0 10*3/uL (ref 0.0–0.1)
Basophil %: 0.1 %
Eos # K/uL: 0 10*3/uL (ref 0.0–0.4)
Eosinophil %: 0.3 %
Hematocrit: 36 % (ref 34–45)
Hemoglobin: 11.2 g/dL (ref 11.2–15.7)
IMM Granulocytes #: 0.1 10*3/uL — ABNORMAL HIGH (ref 0.0–0.0)
IMM Granulocytes: 0.8 %
Lymph # K/uL: 1.5 10*3/uL (ref 1.2–3.7)
Lymphocyte %: 16.4 %
MCH: 25 pg — ABNORMAL LOW (ref 26–32)
MCHC: 32 g/dL (ref 32–36)
MCV: 78 fL — ABNORMAL LOW (ref 79–95)
Mono # K/uL: 1.2 10*3/uL — ABNORMAL HIGH (ref 0.2–0.9)
Monocyte %: 12.7 %
Neut # K/uL: 6.3 10*3/uL — ABNORMAL HIGH (ref 1.6–6.1)
Nucl RBC # K/uL: 0 10*3/uL (ref 0.0–0.0)
Nucl RBC %: 0 /100 WBC (ref 0.0–0.2)
Platelets: 458 10*3/uL — ABNORMAL HIGH (ref 160–370)
RBC: 4.5 MIL/uL (ref 3.9–5.2)
RDW: 14.9 % — ABNORMAL HIGH (ref 11.7–14.4)
Seg Neut %: 69.7 %
WBC: 9.1 10*3/uL (ref 4.0–10.0)

## 2020-01-17 LAB — BLOOD CULTURE
Bacterial Blood Culture: 0
Bacterial Blood Culture: 0

## 2020-01-17 LAB — POCT GLUCOSE: Glucose POCT: 168 mg/dL — ABNORMAL HIGH (ref 60–99)

## 2020-01-17 MED ORDER — AZATHIOPRINE 50 MG PO TABS
50.00 | ORAL_TABLET | ORAL | Status: DC
Start: 2020-01-17 — End: 2020-01-17

## 2020-01-17 MED ORDER — PHENOL 1.4 % MT LIQD
2.00 | OROMUCOSAL | Status: DC
Start: ? — End: 2020-01-17

## 2020-01-17 MED ORDER — SODIUM CHLORIDE 0.9 % IV SOLN
0.00 | INTRAVENOUS | Status: DC
Start: ? — End: 2020-01-17

## 2020-01-17 MED ORDER — METOPROLOL TARTRATE 50 MG PO TABS
100.00 | ORAL_TABLET | ORAL | Status: DC
Start: 2020-01-17 — End: 2020-01-17

## 2020-01-17 MED ORDER — GENERIC EXTERNAL MEDICATION
Status: DC
Start: ? — End: 2020-01-17

## 2020-01-17 MED ORDER — INSULIN LISPRO 100 UNIT/ML ~~LOC~~ SOLN
0.00 | SUBCUTANEOUS | Status: DC
Start: 2020-01-17 — End: 2020-01-17

## 2020-01-17 MED ORDER — HYDROXYCHLOROQUINE SULFATE 200 MG PO TABS
200.00 | ORAL_TABLET | ORAL | Status: DC
Start: 2020-01-17 — End: 2020-01-17

## 2020-01-17 MED ORDER — PANTOPRAZOLE SODIUM 40 MG PO TBEC
40.00 | DELAYED_RELEASE_TABLET | ORAL | Status: DC
Start: 2020-01-18 — End: 2020-01-17

## 2020-01-17 MED ORDER — CETIRIZINE HCL 10 MG PO TABS
10.00 | ORAL_TABLET | ORAL | Status: DC
Start: 2020-01-18 — End: 2020-01-17

## 2020-01-17 MED ORDER — DILTIAZEM HCL ER BEADS 240 MG PO CP24
240.00 | ORAL_CAPSULE | ORAL | Status: DC
Start: 2020-01-18 — End: 2020-01-17

## 2020-01-17 MED ORDER — DEXTROSE 5 % IV SOLN
0.00 | INTRAVENOUS | Status: DC
Start: ? — End: 2020-01-17

## 2020-01-17 MED ORDER — COLCHICINE 0.6 MG PO TABS
0.60 | ORAL_TABLET | ORAL | Status: DC
Start: 2020-01-18 — End: 2020-01-17

## 2020-01-17 MED ORDER — IPRATROPIUM-ALBUTEROL 0.5-2.5 (3) MG/3ML IN SOLN
3.00 | RESPIRATORY_TRACT | Status: DC
Start: ? — End: 2020-01-17

## 2020-01-17 MED ORDER — LOSARTAN POTASSIUM 50 MG PO TABS
100.00 | ORAL_TABLET | ORAL | Status: DC
Start: 2020-01-18 — End: 2020-01-17

## 2020-01-17 MED ORDER — TORSEMIDE 20 MG PO TABS
20.00 | ORAL_TABLET | ORAL | Status: DC
Start: 2020-01-17 — End: 2020-01-17

## 2020-01-17 MED ORDER — ROSUVASTATIN CALCIUM 20 MG PO TABS
20.00 | ORAL_TABLET | ORAL | Status: DC
Start: 2020-01-18 — End: 2020-01-17

## 2020-01-17 MED ORDER — ACETAMINOPHEN 325 MG PO TABS
650.00 | ORAL_TABLET | ORAL | Status: DC
Start: ? — End: 2020-01-17

## 2020-01-17 MED ORDER — INSULIN GLARGINE 100 UNIT/ML ~~LOC~~ SOLN
20.00 | SUBCUTANEOUS | Status: DC
Start: 2020-01-17 — End: 2020-01-17

## 2020-01-17 MED ORDER — OXYGEN *A*
0 refills | Status: AC
Start: 2020-01-17 — End: ?

## 2020-01-17 MED ORDER — TORSEMIDE 20 MG PO TABS *I*
20.0000 mg | ORAL_TABLET | Freq: Two times a day (BID) | ORAL | 0 refills | Status: DC
Start: 2020-01-17 — End: 2020-02-25

## 2020-01-17 MED ORDER — LOSARTAN POTASSIUM 50 MG PO TABS *I*
100.0000 mg | ORAL_TABLET | Freq: Every day | ORAL | 0 refills | Status: AC
Start: 2020-01-17 — End: 2020-02-16

## 2020-01-17 MED ORDER — TORSEMIDE 20 MG PO TABS *I*
20.0000 mg | ORAL_TABLET | Freq: Two times a day (BID) | ORAL | 0 refills | Status: DC
Start: 2020-01-17 — End: 2020-01-17
  Filled 2020-01-17: qty 60, 30d supply, fill #0

## 2020-01-17 MED ORDER — PREDNISONE 10 MG PO TABS *I*
ORAL_TABLET | ORAL | 0 refills | Status: DC
Start: 2020-01-17 — End: 2020-01-17
  Filled 2020-01-17: qty 45, 26d supply, fill #0

## 2020-01-17 MED ORDER — METOPROLOL TARTRATE 50 MG PO TABS *I*
100.0000 mg | ORAL_TABLET | Freq: Two times a day (BID) | ORAL | 0 refills | Status: AC
Start: 2020-01-17 — End: 2020-02-16

## 2020-01-17 MED ORDER — PREDNISONE 10 MG PO TABS *I*
ORAL_TABLET | ORAL | 0 refills | Status: DC
Start: 2020-01-17 — End: 2020-01-17

## 2020-01-17 MED ORDER — LOSARTAN POTASSIUM 50 MG PO TABS *I*
100.0000 mg | ORAL_TABLET | Freq: Every day | ORAL | 0 refills | Status: DC
Start: 2020-01-17 — End: 2020-01-17
  Filled 2020-01-17: qty 60, 30d supply, fill #0

## 2020-01-17 MED ORDER — PREDNISONE 10 MG PO TABS *I*
ORAL_TABLET | ORAL | 0 refills | Status: DC
Start: 2020-01-17 — End: 2020-02-25

## 2020-01-17 MED ORDER — GENERIC DME *A*
0 refills | Status: AC
Start: 2020-01-17 — End: ?

## 2020-01-17 MED ORDER — OXYGEN CONCENTRATOR DEVI *A*
0 refills | Status: AC
Start: 2020-01-17 — End: ?

## 2020-01-17 MED ORDER — METOPROLOL TARTRATE 50 MG PO TABS *I*
100.0000 mg | ORAL_TABLET | Freq: Two times a day (BID) | ORAL | 0 refills | Status: DC
Start: 2020-01-17 — End: 2020-01-17
  Filled 2020-01-17: qty 120, 30d supply, fill #0

## 2020-01-17 NOTE — Discharge Summary (Signed)
Name: Jodi Holmes MRN: [id number] DOB: 06-04-1976     Admit Date: 01/12/2020   Date of Discharge: 01/17/2020     Patient was accepted for discharge to   Home or Self Care [1]           Discharge Attending Physician: Mindi Junker, MD      Hospitalization Summary    CONCISE NARRATIVE: Jodi Holmes is a 44 y/o with a history of HTN, CAD s/p LAD DES 12/2018, SLE c/b pulm fibrosis and chronic pericardial effusion, afib s/p ablation, DMII, who presented on 6/1 with significant dyspnea, non-productive cough, orthopnea but no chest pain. Initial CT angio chest was negative for PE, but it showed multifocal involvement, Extensive bilateral patchy and groundglass lung opacification. Autoimmune process vs atypical infection were high on the differential, with concomitant pulmonary edema. Pulmonology and rheumatology were consulted. IV steroids, IV lasix and broad spectrum antibiotics were started. Antibiotics were discontinued after 24 hours. Subjective improvement were noted with steroids. Labs were remarkable for elevated ANA titers, nucleolar pattern, and antiRNP and SM/RNP Ab which was consistent with flare of Lupus and MCTD. Negative antiSmith, and dsDNA Ab, as well as normal levels of complement. Pt symptoms improved with progressive steroids taper. Progressive O2 taper was done, and pt was discharged on RA at rest, but needing 1L oxygen with ambulation. Pt was discharged on stable condition. Pt will have appointment with Rheumatology and Pulmonology at discharge, before she is returning back home in [place].         CARDIAC TESTING: Chandra Asher  Echo Complete with Contrast  Order# [id number]  Reading physician: Rivka Safer, MD Ordering physician: Mindi Junker, MD Study date: 01/12/20  Patient Information    Name MRN Description  Jeily, Guthridge [id number] 44 y.o. female  PACS Images    Show images for Echo Complete  Indications    Pericardial effusion [I31.3 (ICD-10-CM)]  Interpretation Summary    Technically  difficult study due to body habitus / poor acoustic windows   Concentric LVH with abnormal diastolic function and left atrial enlargement.  Normal LVEF without significant wall motion abnormalities. Aortic valve sclerosis with estimated moderate stenosis  Right heart enlargement/dysfunction with estimated moderately elevated pulmonary artery systolic pressure  Small circumferential pericardial effusion without frank tamponade  Mildly dilated ascending aorta.          CT RESULTS: CT angio chest  Status: Final result  PACS Images    Show images for CT angio chest  Reading Provider: Erby Pian, MD  Signed By:  Erby Pian, MD on 01/12/2020 12:36 PM    Sub-optimal examination for segmental and subsegmental pulmonary embolism due to respiratory motion artifact.  No evidence of pulmonary embolism.    Extensive bilateral patchy and groundglass lung opacification, likely multifocal infection/inflammation with underlying mild edema. Follow-up chest imaging should be performed as clinically warranted.    Moderate to large pericardial effusion.    Cardiomegaly. Mild pulmonary artery enlargement. Reflux of IV contrast into the IVC and hepatic veins, suggestive of elevated right heart pressures.    Aortic valve calcification. Moderate left-sided coronary artery calcifications. These findings are advanced for patient's age. Clinical correlation is recommended.    Multiple borderline sized to mildly enlarged mediastinal and hilar lymph nodes, suspected to be reactive.    END REPORT        XRAY RESULTS: Chest standard frontal and lateral views  Status: Final result  PACS Images    Show images for *Chest standard frontal and lateral views  Reading Provider: Zara Chess, MD  Signed By:  Zara Chess, MD on 01/12/2020  9:54 AM    Extensive bilateral lung opacities, suspicious for pulmonary edema given cardiac silhouette enlargement. Superimposed infection cannot be excluded in the appropriate clinical context.  Underlying fibrotic lung disease cannot be excluded given the clinical  history.    Cardiac silhouette enlargement, could be due to cardiomegaly, however findings are concerning for pericardial effusion. Clinical/echocardiographic correlation is recommended.    END OF IMPRESSION           SIGNIFICANT MED CHANGES: Yes  Increase Losartan 100 mg daily   Increase Metoprolol 100 mg BID   Start torsemide 20 mg BID   Stop lasix    Hold Eliquis and Esbriet (until pt has appointment with Pulmonology)   [place]     Pulmonology     Rheumatology     Critical Care Medicine             Signed: Dalphine Handing, MD  On: 01/17/2020  at: 12:48 PM

## 2020-01-17 NOTE — Progress Notes (Signed)
Hospital Medicine Progress Note    LOS: 5 days                                                Significant 24 Hour Events      No acute events overnight.    Subjective      Patient is feeling much better this morning. Said that she walked  To the restorrom, and she did feel ok. Denies cough, chest pain, or sputum production.      Objective      Vitals:  Temp:  [36 C (96.8 F)-37.9 C (100.2 F)] 36.9 C (98.4 F)  Heart Rate:  [80-101] 88  Resp:  [16-22] 20  BP: (122-156)/(68-95) 140/68    Physical Exam  General Constitutional: Patient calm, pleasant, cooperative, in no acute distress  HEENT: MMM, no tonsillar erythema, no ulcerations or erosions, PERRL, no LAD or thyromegaly  Cardiovascular: Irregular rhythm, regular rate, normal S1/S2 no MRG, no obvious JVD  Pulmonary: Normal work of breathing, diffuse, dry crackles posteriorly, improving  Gastrointestinal: active bowel sounds, non-tender to palpation, non-distended  Extremities: trace pitting edema bilaterally, brisk peripheral pulses  Neurologic: AOx3, no FND   Skin: no rashes    Laboratory Data:  Recent Labs   Lab 01/17/20  0040 01/16/20  0057 01/15/20  0026   WBC 9.1 8.4 11.8*   Hemoglobin 11.2 10.3* 11.0*   Hematocrit 36 33* 36   Platelets 458* 412* 428*   MCV 78* 80 80   RDW 14.9* 15.2* 15.4*     Recent Labs   Lab 01/17/20  0040 01/16/20  0057 01/15/20  0026 01/13/20  0028 01/12/20  0922   Sodium 139 137 138   < > 141   Potassium 3.8 4.2 3.5   < > 3.3   Chloride 98 98 101   < > 100   CO2 _0 < > 26   Anion Gap _1 < > 15   UN 20 19 21*   < > 11   Creatinine 0.75 0.79 0.72   < > 0.70   Glucose 192* 311* 156*   < > 118*   Calcium 8.8 8.5* 9.3   < > 9.0   Phosphorus  --   --   --   --  3.6   Magnesium  --   --   --   --  1.5*    < > = values in this interval not displayed.         Microbiology:   COVID negative  Influenza A/B negative  RSV negative  Blood Cultures NGTD  Sputum Cultures Not sent  Aspergillus Antigen pending  Strep Pneumo  Antigen negative  Legionella Antigen negative    Relevant Imaging:   ECHO:  Technically difficult study due to body habitus / poor acoustic windows   Concentric LVH with abnormal diastolic function and left atrial enlargement.  Normal LVEF without significant wall motion abnormalities. Aortic valve sclerosis with estimated moderate stenosis  Right heart enlargement/dysfunction with estimated moderately elevated pulmonary artery systolic pressure  Small circumferential pericardial effusion without frank tamponade  Mildly dilated ascending aorta.     CTA Chest:  Sub-optimal examination for segmental and subsegmental pulmonary embolism due to respiratory motion artifact.   No evidence of pulmonary embolism.  Extensive bilateral patchy and groundglass lung opacification, likely multifocal infection/inflammation with underlying mild edema. Follow-up chest imaging should be performed as clinically warranted.      Moderate to large pericardial effusion.      Cardiomegaly. Mild pulmonary artery enlargement. Reflux of IV contrast into the IVC and hepatic veins, suggestive of elevated right heart pressures.      Aortic valve calcification. Moderate left-sided coronary artery calcifications. These findings are advanced for patient's age. Clinical correlation is recommended.      Multiple borderline sized to mildly enlarged mediastinal and hilar lymph nodes, suspected to be reactive.     Chest X-Ray:  Extensive bilateral lung opacities, suspicious for pulmonary edema given cardiac silhouette enlargement. Superimposed infection cannot be excluded in the appropriate clinical context. Underlying fibrotic lung disease cannot be excluded given the clinical   history.      Cardiac silhouette enlargement, could be due to cardiomegaly, however findings are concerning for pericardial effusion. Clinical/echocardiographic correlation is recommended.     Assessment     Jodi Holmes is a 44 y.o. female with medical history  significant for lupus, pulmonary fibrosis, CHF, paroxysmal atrial fibrillation with RVR, CAD s/p LAD PCI, pericardial effusion, moderate aortic stenosis, diabetes mellitus, HTN, and HLD who presents in acute hypoxic respiratory failure. Etiology of respiratory failure unclear at this time. It is more likely pulmonary due to ground glass opacities on CT, relatively euvolemic exam, and history of both lupus flare. Concomitant heart failure also caused worsening of her symptoms that improved with diuretics. Pt has improved, and will be discharged home with follow up with Rheumatology and Pulmonology.    Plan     Acute Hypoxic Respiratory Failure, Likely Secondary to Lupus Flare Significant improvement with steroids, elevated inflammatory markers, GGOs on CT, and very elevated ANA titers indicate autoimmune source  - Chest x-ray and CTA as above, notable for significant GGOs, no PE  - Currently on 2 L NC   - had pulOx with ambulation: needed 1L to keep StaO2>92  - ESR/CRP 89/139, procal 0.21 on admission  - S/p 1 day of vanc/zosyn treatment  - Blood cultures NGTD, sputum cultures not sent for lack of organisms  - MRSA swab negative  - Aspergillus negative, beta-d-glucan antigen negative  - UA notable for 2+ protein, trace glucose and ketones, microscopy with 4+ squams  - urine legionella and strep pneumo antigens negative  - Pulmonology consult, appreciate their assistance   - Hold home Esbriet   - Diurese for goal net negative 1-2 L  - Rheumatology consult, appreciate their input   - ANA positive, titers 1280, nucleolar pattern, Anti RNP positive   - C3, C4, ANCA, RF, Anti SSA/SSB, Anti Smith, Anti-dsDNA within  normal limits   - MPO pending   - Lupus anticoagulation panel notable for fibrinogen of 814, INR 1.5,  anti-cardiolipin antibody and beta-2-glycoprotein negative    Pericardial Effusion chronic, no tamponade physiology on presentation so unlikely acutely enlarging  - ECHO as above, size of effusion stable  compared to prior exam from outside hospital  - S/p 40 mg IV lasix x2 on arrival, minimal symptomatic improvement  - Continue home colchicine 0.6 mg every other day, reduced for diarrhea  - Torsemide 20 mg PO BID, patient responded well, may decrease dose for discharge  - Strict I/O, net negative 1.5L yesterday    Paroxysmal Atrial Fibrillation with RVR  - Metoprolol 100 mg BID, consider switch to Coreg for BP control  - ECHO as above  -  Continue home diltiazem 240 mg daily   - Metoprolol 5 mg IV PRN for sustain HR >140  - Hold home Eliquis 5 mg BID, consult with pulm for appropriate restart time   - will hold until pt has follow up appointment with [facility].     Lupus with secondary Pulmonary Fibrosis unclear if in flare at this time  - Rheumatology consulted, appreciate assistance  - ANA positive, titers 1280, nucleolar pattern, Anti RNP positive  - C3, C4, ANCA, RF, Anti SSA/SSB, Anti Smith, Anti-dsDNA within normal limits  - MPO pending  - ESR/CRP 89/139, procal 0.21  - Home plaquenil and azothioprine per rehumatology recs  - Hold home Esbriet until  Follow up with Pulm  - Hypercoagulable state labs normal    Diabetes mellitus Type 2  - Continue lantus home 20 units nightly  - Lispro SS, increase NB to 10, consider increasing further as needed  - POCT BGs with meals and nightly  - BGs in 200s past 24 hours  - Hold home Victoza    Chronic problems:  HTN: Continue losartan 100 mg daily  HLD: Continue home Crestor 20 mg daily  GERD: Home Protonix 40 mg daily  CAD: Hold home plavix, recent cardiology note indicates stopping    Labs: CBC, BMP daily  Diet: Consistent Carbohydrate  DVT ppx: Contraindicated for Possible [facility]     Discharge Planning:   Expected Discharge Date: 01/17/2020  Discharge Criteria/Barriers to Discharge: Oxygen Requirement  PT and/or OT Recommendations/Discharge to: Prior Living Arrangement  Post-discharge Appointments Needed with: PCP, Pulmonology, Rehumatology    Code Status: FULL  CODE    Trinidad Curet, MD  Internal Medicine, PGY-2   [phone]  8:40 AM, 01/17/2020

## 2020-01-17 NOTE — Progress Notes (Signed)
[id number] Discharge Note    AVS and insurance form reviewed and signed by patient.  No questions or concerns voiced.  Education provided on new medications and oxygen.  Patient dressed appropriately for the weather.  New medications awaiting in [facility] [place] location per patient request.  Patient left unit without issue accompanied by significant other in wheelchair with home oxygen that had been delivered.      Transportation method: wheelchair, car

## 2020-01-17 NOTE — Discharge Instructions (Signed)
Brief Summary of Your Hospital Course (including key procedures and diagnostic test results):  You came to the hospital because you were very short of breath and unable to lie down and breathe comfortably. You also had a worsening cough. We placed you on supplemental oxygen, and got imaging of your chest, which showed inflammation of your lung tissue and fluid build up in your lungs. You were started on steroids and diuretics (water pills) and you began to feel better. We consulted the rheumatologists and pulmonologists for help with your care. At this time you are no longer requiring oxygen support and feel comfortable breathing when lying down. We are not sure what specifically caused your symptoms. You will follow    Your instructions:  - Take all medications as prescribed  - Follow up with the rheumatologists. They will schedule an appointment for you.    New Medications:  - Prednisone 40 mg (four tablets) daily for 5 days to finish one total week. Then reduce your dose to 20 mg (two tablets) for seven days. Then reduce your dose to 10 mg (one tablet) for seven days. Then reduce your dose to 5 mg (half tablet) for seven days. Then stop  - Torsemide 20 mg twice daily. This is a replacement for your furosemide.     Changes to existing medications:  - Stop taking furosemide.  - stop Erbriet for now until you discuss with your Primary pulmonologist   - stop Eliquis     - Increase losartan 100 mg daily   - increase metoprolol 100 mg twice a day     What to do after you leave the hospital:    Recommended diet: Low Sodium     Recommended activity: activity as tolerated    Wound Care: none needed    If you experience any of these symptoms within the first 24 hours after discharge: Chest pain, Shortness of breath, or bloody cough  please follow up with the discharge attending Mindi Junker at phone-number: [phone]    If you experience any of these symptoms 24 hours or more after discharge: Chest pain, Shortness  of breath, or bloody cough  please follow up with your PCP:  Unknown, Provider, MD None

## 2020-01-17 NOTE — Continuity of Care (Addendum)
Writer received notification from provider of pt being discharged home today with a new oxygen requirement. Writer spoke to pt via the phone, confirmed demographics and PCP is Bertram Denver NP located in [employer] updated with information. DME home oxygen placed via [facility] [phone] to be delivered to bedside prior to discharge. Demographics and clinical notes faxed to [phone]. Covering provider notified to tube script to station 85 with ICD, flow, rate, route and continuous vs while ambulating on script. Weekend ACC will continue to follow and assist with discharge planning.    Addendum at 11:55am: Scripts faxed, if vendor has no issues with the scripts, expect DME within an hour.    Blanchard Mane, RN BSN  Weekend Acute Care Coordinator   Phone: [phone] Pager: [phone]  [facility] Care Coordinator: [phone]

## 2020-01-17 NOTE — Plan of Care (Signed)
Problem: Safety  Goal: Patient will remain free of falls  Outcome: Adequate for discharge  Goal: Prevent any intentional injury  Outcome: Adequate for discharge     Problem: Pain/Comfort  Goal: Patient's pain or discomfort is manageable  Outcome: Adequate for discharge     Problem: Nutrition  Goal: Nutritional status is maintained or improved - Geriatric  Outcome: Adequate for discharge     Problem: Mobility  Goal: Functional status is maintained or improved - Geriatric  Outcome: Adequate for discharge     Problem: Psychosocial  Goal: Demonstrates ability to cope with illness  Outcome: Adequate for discharge

## 2020-01-18 ENCOUNTER — Ambulatory Visit: Payer: 59

## 2020-01-18 LAB — MYELOPEROXIDASE AB: Myeloperoxidase Ab: 2 U (ref 0–20)

## 2020-01-18 LAB — ASPERGILLUS ANTIGEN: Aspergillus Antigen by EIA: 0

## 2020-01-18 LAB — GLOMERULAR BASEMENT MEMBRANE ANTIBODIES: Glomerular Membrane AB: 0 AU/mL (ref 0–19)

## 2020-01-20 ENCOUNTER — Other Ambulatory Visit
Admission: RE | Admit: 2020-01-20 | Discharge: 2020-01-20 | Disposition: A | Discharge status: Home / Self Care | Payer: PRIVATE HEALTH INSURANCE | Source: Ambulatory Visit | Attending: Internal Medicine | Admitting: Internal Medicine

## 2020-01-20 DIAGNOSIS — I509 Heart failure, unspecified: Secondary | ICD-10-CM | POA: Insufficient documentation

## 2020-01-20 LAB — BASIC METABOLIC PANEL
Anion Gap: 13 (ref 7–16)
CO2: 29 mmol/L — ABNORMAL HIGH (ref 20–28)
Calcium: 9.2 mg/dL (ref 8.8–10.2)
Chloride: 95 mmol/L — ABNORMAL LOW (ref 96–108)
Creatinine: 1.12 mg/dL — ABNORMAL HIGH (ref 0.51–0.95)
GFR,Black: 69 *
GFR,Caucasian: 60 *
Glucose: 408 mg/dL — ABNORMAL HIGH (ref 60–99)
Lab: 29 mg/dL — ABNORMAL HIGH (ref 6–20)
Potassium: 4.1 mmol/L (ref 3.3–5.1)
Sodium: 137 mmol/L (ref 133–145)

## 2020-01-21 ENCOUNTER — Ambulatory Visit: Payer: 59 | Admitting: Internal Medicine

## 2020-01-21 LAB — LEGIONELLA CULTURE: Legionella Culture: 0

## 2020-01-27 LAB — EKG 12-LEAD
P: 32 deg
PR: 159 ms
QRS: 128 deg
QRS: 124 deg
QRSD: 102 ms
QRSD: 102 ms
QT: 328 ms
QT: 352 ms
QTc: 458 ms
QTc: 481 ms
Rate: 117 {beats}/min
Rate: 112 {beats}/min
T: 145 deg
T: 146 deg

## 2020-02-01 ENCOUNTER — Other Ambulatory Visit: Payer: Self-pay | Admitting: Cardiology

## 2020-02-01 DIAGNOSIS — I10 Essential (primary) hypertension: Secondary | ICD-10-CM

## 2020-02-02 ENCOUNTER — Telehealth: Payer: Self-pay

## 2020-02-02 ENCOUNTER — Telehealth: Payer: Self-pay | Admitting: Internal Medicine

## 2020-02-02 NOTE — Telephone Encounter (Signed)
Copied from CRM [id number]. Topic: Appointments - Schedule Appointment  >> Feb 02, 2020 10:21 AM Elesa Hacker wrote:  The patient is requesting to speak with Secretary. Patient stated that this is regarding scheduling FUV [facility] Discharge Dr. Threasa Beards).    Please call the patient back at [phone] to discuss.

## 2020-02-02 NOTE — Telephone Encounter (Signed)
Copied from CRM [id number]. Topic: Appointments - Appointment Information  >> Feb 02, 2020 10:01 AM Jodi Holmes wrote:  Patient is calling to schedule a Discharge FUV She stated she was instructed by the ED when she left that our office would be contacting her within 2 weeks to schedule a FUV. Patient was discharged  on 6/6 for Pulmonary edena.  Patient can be reached at [phone]

## 2020-02-03 NOTE — Telephone Encounter (Signed)
LM for Jodi Holmes stating that she is a patient of [facility] Pulmonary and we just wondering, if she is going to continue going there or if she is going to transfer care

## 2020-02-03 NOTE — Telephone Encounter (Addendum)
Confirmed w Ms. Neria appointment with Dr. Tonny Bollman on 8.12.21 & placed in the wait list for a sooner opening

## 2020-02-05 ENCOUNTER — Telehealth: Payer: Self-pay | Admitting: Internal Medicine

## 2020-02-05 NOTE — Telephone Encounter (Signed)
Called and spoke with Patient. Patient stated she was hospitalized while visiting relatives.  Patient stated they d/c Esbriet and placed her on Prednisone. Patient is requesting prednisone refill.  Per LOV 10/21/19, Patient was no longer on Prednisone, and doing better.  Patient scheduled My chart OV with Beth, NP 02/10/20 at 3pm. Nothing further at this time.

## 2020-02-07 ENCOUNTER — Other Ambulatory Visit: Payer: Self-pay | Admitting: Nurse Practitioner

## 2020-02-07 DIAGNOSIS — I10 Essential (primary) hypertension: Secondary | ICD-10-CM

## 2020-02-09 ENCOUNTER — Other Ambulatory Visit: Payer: Self-pay | Admitting: Student in an Organized Health Care Education/Training Program

## 2020-02-09 DIAGNOSIS — J849 Interstitial pulmonary disease, unspecified: Secondary | ICD-10-CM

## 2020-02-09 NOTE — Telephone Encounter (Signed)
Confirmed with Ms. Jennylee sooner appointment with Dr. Tonny Bollman on 7.15.21.    Dr. Tonny Bollman, would you like testing, if yes, please place order

## 2020-02-10 ENCOUNTER — Other Ambulatory Visit: Payer: Self-pay | Admitting: Student in an Organized Health Care Education/Training Program

## 2020-02-10 ENCOUNTER — Telehealth (INDEPENDENT_AMBULATORY_CARE_PROVIDER_SITE_OTHER): Payer: 59 | Admitting: Primary Care

## 2020-02-10 ENCOUNTER — Telehealth: Payer: Self-pay | Admitting: Primary Care

## 2020-02-10 DIAGNOSIS — J849 Interstitial pulmonary disease, unspecified: Secondary | ICD-10-CM | POA: Diagnosis not present

## 2020-02-10 MED ORDER — PREDNISONE 10 MG PO TABS
ORAL_TABLET | ORAL | 0 refills | Status: DC
Start: 2020-02-10 — End: 2020-04-19

## 2020-02-10 NOTE — Patient Instructions (Signed)
Sent in additional prednisone taper Will discuss resuming Esbriet with Dr. Chase Caller Needs office follow up in 2-4 weeks when back from [place]

## 2020-02-10 NOTE — Progress Notes (Signed)
Virtual Visit via Video Note  I connected with [name] on 02/10/20 at  3:30 PM EDT by a video enabled telemedicine application and verified that I am speaking with the correct person using two identifiers.  Location: Patient: Home Provider: Office   I discussed the limitations of evaluation and management by telemedicine and the availability of in person appointments. The patient expressed understanding and agreed to proceed.  History of Present Illness: 44 year old female, never smoked. PMH significant for ILD, lupus, afib, CHF, pericardial effusion. Patient of Dr. Chase Caller.   Patient recently admitted at outside [place] in June 1-6th with significant dyspnea, nonproductive cough and orthopnea.  Initial CT angio chest negative for PE but showed multifocal involvement with extensive bilateral patchy groundglass lung opacification.  Autoimmune process versus atypical infection on differential with underlying pulmonary edema.  Pulmonary and rheumatology were consulted.  Patient received IV steroids, Lasix and broad-spectrum antibiotics.  She eventually improved.  Labs were remarkable for elevated ANA titers, nuclear pattern, anti-RNP and SM/R NP space AB which was consistent with flare of lupus and MC TD.  Patient improved with steroid taper. She was discharged on Prednisone 40mg  x 5 days (to finish 1 week); then reduced to 20mg  x 1 week; then 10mg  x 1 week; then 5mg  x 1 week then stop. She required 1 L of oxygen with ambulation on day of discharge.  Significant medication change; Started on torsemide 20 mg twice daily, increase metoprolol 100 mg twice daily and increase losartan 100 mg daily. Stopped lasix and Eliquis stopped. Esbriet on hold until follow-up with pulmonary.  02/10/2020 Patient contacted today for virtual visit. She is currently still in [place] She feels a lot better since being discharged from the hospital.  She was on prednisone but ran out before  completing prescribed taper 4 days ago   She has since resumed Eliquis for Afib as of last week She remains on Plaquenil and Imuran for her Lupus She is taking Torsemide 20mg  BID as instructed. Lasix was discontinued.  She has been off anti-fibrotic since June 1st. She was on 801mg  TID previously.    Observations/Objective:  - Appears well; no shortness of breath, wheezing or cough  Assessment and Plan:  44 year old female. Medical history significant for ILD, Lupus, afib, CHF, pericardial effusion. She was recently admitted at outside [facility] from June 1-6th with significant dyspnea, nonproductive cough and orthopnea.  Initial CT angio chest negative for PE but showed multifocal involvement with extensive bilateral patchy groundglass lung opacification.  Autoimmune process versus atypical infection on differential with underlying pulmonary edema. She was treated her with IV steroids/lasix. She was discharged on prednisone taper. Started on Torsemide 20mg  BID. They told her not to take Esbriet until follow-up. She has not been on antifi-brotic since she was admitted on June 1. She is still in [place].   ILD: - Ok give additional prednisone quantity to complete taper  - RX Prednisone 10mg  x 1 week; 5mg  x 1 week then stop [facility] [address] [employer]  [phone]) - Will discuss with Dr. Chase Caller about resuming Esbriet, likely will need to restart at 267mg  TID - Needs follow up in office with Spirometry when she gets back from [place]   Follow Up Instructions:   - 4 weeks in OV with spirometry  I discussed the assessment and treatment plan with the patient. The patient was provided an opportunity to ask questions  and all were answered. The patient agreed with the plan and demonstrated an understanding of the instructions.   The patient was advised to call back or seek an in-person evaluation if the symptoms worsen or if the condition fails  to improve as anticipated.  I provided 22 minutes of non-face-to-face time during this encounter.   Martyn Ehrich, NP

## 2020-02-10 NOTE — Telephone Encounter (Signed)
Patient needs follow-up with MR in 2-4 weeks/ also needs spirometry and DLCO around that time

## 2020-02-10 NOTE — Telephone Encounter (Signed)
Patient of yours with Lupus/ILD. She is on Esbriet and Plaquenil/Imuran. She was recently admitted at outside hospital/ [place] in June 1-6th with significant dyspnea, nonproductive cough and orthopnea.  Initial CT angio chest negative for PE but showed multifocal involvement with extensive bilateral patchy groundglass lung opacification.  Autoimmune process versus atypical infection on differential with underlying pulmonary edema.   She was treated her with IV steroids/lasix. She was discharged on prednisone taper. Started on Torsemide 20mg  BID. They told her not to take Esbriet until follow-up. She has not been on antifi-brotic since she was admitted on June 1. She is still in [place].   Do you want to see her before restarting or are you ok with resuming Esbriet at 267 TID and tapering up to 801mg  TID dose.   -Beth

## 2020-02-11 NOTE — Telephone Encounter (Signed)
Patient scheduled for Spiro& DLCO 03/14/20 at 1100.  Patient stated she has had Covid vaccine  and is  aware to bring Covid vaccine card. Dr Chase Caller does not have any openings at this time. Patient is aware someone will contact her to schedule follow up OV with Ramaswamy when his next schedule opens.  Message routed to [place], [place] to follow up on OV

## 2020-02-11 NOTE — Telephone Encounter (Signed)
If she is stable now she can restart pirfenidone and slowly work way up to full dose

## 2020-02-12 NOTE — Telephone Encounter (Signed)
Routing to front desk pool so they can also help [place] keep an eye out when MR's schedule opens up. Pt needs to be scheduled with him after PFT which is scheduled on 8/2.

## 2020-02-16 NOTE — Telephone Encounter (Signed)
Can we please send in prescription for Esbriet 267 1 tab TID x 1 week; then take 2 tabs TID x 1 week; then resume 801mg  TID. She is in [place] and can start when she gets back- please notify her of this as I discussed it with Dr. Chase Caller. Prescription will need to go to specialty pharmacy, she has it delivered to the pharamacy and not her house.   Cc: Pharmacy

## 2020-02-16 NOTE — Telephone Encounter (Signed)
Attempted to call pt but unable to reach. Left message for her to return call. 

## 2020-02-17 NOTE — Telephone Encounter (Signed)
LM for Jodi Holmes she is also scheduled for PFT on 7.29.21

## 2020-02-18 MED ORDER — ESBRIET 267 MG PO TABS: ORAL_TABLET | ORAL | 0 refills | Status: DC

## 2020-02-18 NOTE — Telephone Encounter (Signed)
Jodi Holmes contacted the office in request of scheduling a discharge FUV, per her 6/1 hospital discharge instructions.     Note: Jodi Holmes is scheduled for a discharge FUV with pulmonology on 7/15, and is hoping to coordinate as she will be commuting from out of [place].     Please contact Jodi Holmes at [phone], to schedule.

## 2020-02-18 NOTE — Telephone Encounter (Signed)
Called and spoke to pt - scheduled f/u on 03/29/2020-pr

## 2020-02-18 NOTE — Telephone Encounter (Signed)
Prescription sent [facility] for [facility] to be delivered to local pharmacy.  Nothing further needed.   Mariella Saa, PharmD, [place], CPP Clinical Specialty Pharmacist (Rheumatology and Pulmonology)  02/18/2020 12:24 PM

## 2020-02-19 ENCOUNTER — Other Ambulatory Visit: Payer: Self-pay | Admitting: Cardiology

## 2020-02-19 DIAGNOSIS — I10 Essential (primary) hypertension: Secondary | ICD-10-CM

## 2020-02-25 ENCOUNTER — Ambulatory Visit: Payer: PRIVATE HEALTH INSURANCE | Admitting: Student in an Organized Health Care Education/Training Program

## 2020-02-25 ENCOUNTER — Encounter: Payer: Self-pay | Admitting: Student in an Organized Health Care Education/Training Program

## 2020-02-25 VITALS — BP 188/84 | HR 101 | Temp 97.4°F | Resp 20 | Ht 60.0 in | Wt 229.0 lb

## 2020-02-25 DIAGNOSIS — J849 Interstitial pulmonary disease, unspecified: Secondary | ICD-10-CM

## 2020-02-25 MED ORDER — TORSEMIDE 20 MG PO TABS *I*
20.0000 mg | ORAL_TABLET | Freq: Two times a day (BID) | ORAL | 0 refills | Status: AC
Start: 2020-02-25 — End: ?

## 2020-02-25 MED ORDER — PREDNISONE 10 MG PO TABS *I*
ORAL_TABLET | ORAL | 0 refills | Status: AC
Start: 2020-02-25 — End: 2020-03-21

## 2020-02-25 NOTE — Progress Notes (Signed)
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      She was discharged from the hospital on 6/4 on a steroid taper that ended about 3 weeks ago.  About a day after she had worsening cough and some increase in dyspnea and she actually had a video visit with her pulmonary NP in [place].  The prescribed an additional prednisone 61m then 526mtaper however she took the entire prescription as 1065maily.  She does not feel better since then.  She continues to have cough and DoE beyond her baseline, but not nearly as bad as when we saw her in the hospital.  She is checking her oxygen at home and it is generally 92-95% SpO2.  No new complaints.    Of [place]      Current Outpatient Medications   Medication    predniSONE (DELTASONE) 10 MG tablet    torsemide (DEMADEX) 20 MG tablet    acetaminophen (TYLENOL) 500 mg tablet    albuterol HFA (PROVENTIL, VENTOLIN, PROAIR HFA) 108 (90 Base) MCG/ACT inhaler    azaTHIOprine (IMURAN) 50 MG tablet    OneTouch Verio (ONETOUCH VERIO) kit    cetirizine (ZYRTEC) 10 MG tablet    hydroxychloroquine (PLAQUENIL) 200 MG tablet    Colchicine 0.6 MG CAPS    cyclobenzaprine (FLEXERIL) 10 MG tablet    diltiazem (TIAZAC, DILTZAC, TAZTIA XT) 240 MG 24 hr capsule    ergocalciferol (DRISDOL) 50000 UNIT capsule    blood glucose (ONETOUCH VERIO) test strip    insulin aspart (NOVOLOG FLEXPEN) 100 UNIT/ML injection pen    insulin aspart (NOVOLOG) 100 UNIT/ML injection vial    insulin glargine (BASAGLAR KWIKPEN) 100 unit/mL injection pen    insulin pen needle (TRUEPLUS PEN NEEDLES) 32G X 4 MM    lancing device (ONETOUCH DELICA)    OneTouch Delica Lancets [id number] liraglutide (VICTOZA) 18 MG/3ML injection pen    medroxyPROGESTERone (DEPO-PROVERA) 150 MG/ML injection    pantoprazole (PROTONIX)  40 MG EC tablet    rosuvastatin (CRESTOR) 20 MG tablet    traMADol (ULTRAM) 50 MG tablet    oxygen    oxygen concentrator    generic DME    losartan (COZAAR) 50 MG tablet    metoprolol tartrate (LOPRESSOR) 50 MG tablet     No current facility-administered medications for this visit.       ROS: A 13 point review of systems was completed, is as per HPI, and is otherwise negative.    OBJECTIVE   Physical Exam:    Vitals: Blood pressure (!) 188/84, pulse 101, temperature 36.3 C (97.4 F), resp. rate 20, height 1.524 m (5'), weight 103.9 kg (229 lb), SpO2 92 %. on Body mass index is 44.72 kg/m.      General: alert, NAD  HENT: MMM  CV: RRR, extremities warm, really no LE edema  Pulm: breathing comfortably, bibasilar crackles posteriorly, no wheeze  Extremities: no overt joint abnormalities  Skin: no rashes or lesions  Neuro: alert, oriented, no overt focal deficits        Additional Data:    Recent Labs:  Lab Results   Component Value Date    WBC 9.1 01/17/2020    HGB 11.2 01/17/2020    HCT 36 01/17/2020    PLT 458 (H)  01/17/2020    ALT 17 01/12/2020    AST 28 01/12/2020    NA 137 01/20/2020    K 4.1 01/20/2020    CL 95 (L) 01/20/2020    CREAT 1.12 (H) 01/20/2020    CA 9.2 01/20/2020    UN 29 (H) 01/20/2020    CO2 29 (H) 01/20/2020    TSH 1.73 01/12/2020    INR 1.5 (H) 01/13/2020            ASSESSMENT & PLAN:     Jodi Holmes is a 44 y.o. 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    She has had worsening of her respiratory symptoms since stopping her initial steroid taper.  We will thus prescribe her a longer prednisone taper at a higher dose.  In the meantime she has arranged for follow up appointments with both pulmonary and rheumatology back home in [place].  She should follow up with them within about a month (scheduled) in order to discuss whether she requires further immune suppression with  steroid sparing agents.      Summary recommendations:  - start prednisone 73m daily, taper by 131mevery 5 days  - continue azathioprine and hydroxychloroquine as directed by rheumatology  - refilled home torsemide  - continue off pirfenidone  - [place]        BeKennith GainMD  [facility]

## 2020-02-26 ENCOUNTER — Telehealth: Payer: Self-pay

## 2020-02-26 NOTE — Telephone Encounter (Addendum)
Spoke with patient to find out what company provides her oxygen. Patient said she'd find out and call me back so arrangements can be made for pick up of equipment as patient is [place] out of [place]. Will await Patient's response.

## 2020-03-01 NOTE — Telephone Encounter (Signed)
Was unable to reach patient to follow up on getting her oxygen equipment provider's name for pick up as she is moving out of state. Told patient to call if she was unable to get oxygen picked up.

## 2020-03-03 ENCOUNTER — Ambulatory Visit (INDEPENDENT_AMBULATORY_CARE_PROVIDER_SITE_OTHER): Payer: Self-pay

## 2020-03-03 ENCOUNTER — Other Ambulatory Visit: Payer: Self-pay

## 2020-03-03 VITALS — BP 147/97 | HR 81 | Ht 61.0 in | Wt 224.0 lb

## 2020-03-03 DIAGNOSIS — Z3042 Encounter for surveillance of injectable contraceptive: Secondary | ICD-10-CM | POA: Diagnosis not present

## 2020-03-03 DIAGNOSIS — Z3202 Encounter for pregnancy test, result negative: Secondary | ICD-10-CM

## 2020-03-03 LAB — POCT PREGNANCY, URINE: Preg Test, Ur: NEGATIVE

## 2020-03-03 MED ORDER — MEDROXYPROGESTERONE ACETATE 150 MG/ML IM SUSP
150.0000 mg | Freq: Once | INTRAMUSCULAR | Status: AC
  Administered 2020-03-03: 150 mg via INTRAMUSCULAR

## 2020-03-03 NOTE — Progress Notes (Signed)
[name] here for Depo-Provera  Injection.  Injection administered without complication. Patient will return in 3 months for next injection.  Pt is late 4 weeks from last injection.  Pt reports she has unprotected sex about 2-3 weeks ago.  UPT negative.  Pt reports not having a period due to her being on Depo Provera.  Reviewed chart with Dr. Elgie Congo who recommends that pt can receive Depo Provera today.    Verdell Carmine, RN 03/03/2020  8:55 AM

## 2020-03-07 ENCOUNTER — Other Ambulatory Visit: Payer: Self-pay | Admitting: Student in an Organized Health Care Education/Training Program

## 2020-03-08 ENCOUNTER — Other Ambulatory Visit: Payer: Self-pay

## 2020-03-08 ENCOUNTER — Ambulatory Visit (HOSPITAL_COMMUNITY)
Admission: EM | Admit: 2020-03-08 | Discharge: 2020-03-08 | Disposition: A | Discharge status: Home / Self Care | Payer: Medicaid Other | Attending: Physician Assistant | Admitting: Physician Assistant

## 2020-03-08 ENCOUNTER — Other Ambulatory Visit: Payer: Self-pay | Admitting: Family Medicine

## 2020-03-08 ENCOUNTER — Encounter (HOSPITAL_COMMUNITY): Payer: Self-pay | Admitting: Emergency Medicine

## 2020-03-08 DIAGNOSIS — L02411 Cutaneous abscess of right axilla: Secondary | ICD-10-CM

## 2020-03-08 DIAGNOSIS — I10 Essential (primary) hypertension: Secondary | ICD-10-CM

## 2020-03-08 DIAGNOSIS — L739 Follicular disorder, unspecified: Secondary | ICD-10-CM

## 2020-03-08 MED ORDER — LIDOCAINE-EPINEPHRINE 1 %-1:100000 IJ SOLN
INTRAMUSCULAR | Status: AC
  Filled 2020-03-08: qty 1

## 2020-03-08 MED ORDER — DOXYCYCLINE HYCLATE 100 MG PO CAPS
100.0000 mg | ORAL_CAPSULE | Freq: Two times a day (BID) | ORAL | 0 refills | Status: DC
Start: 2020-03-08 — End: 2020-04-19

## 2020-03-08 NOTE — ED Triage Notes (Signed)
Abscesses under both arms. This is 3rd occurrence, last occurred in January.   Have been present for 1.5 weeks.

## 2020-03-08 NOTE — ED Provider Notes (Signed)
[place]    CSN: [id number] Arrival date & time: 03/08/20  6295      History   Chief Complaint Chief Complaint  Patient presents with  . Abscess    HPI [name] is a 44 y.o. female.   Patient with numerous comorbidities presents for evaluation of possible abscess in both axilla.  She reports she has had abscesses and bumps in the axilla before.  She reports these episodes started over the last week or so.  She reports the right is larger than the left and more painful.  She has not noted any drainage from either axilla.  She denies any fevers or chills.  She has tried warm compresses without any success.  She reports she has been prescribed antibiotics in the past and this worked and did not require drainage.  She does report she uses antiperspirants.  Denies other any new skin products.     Past Medical History:  Diagnosis Date  . Abnormal Pap smear of cervix    colpo, HPV  . Allergy   . Anemia   . Atrial fibrillation ([place])   . CHF (congestive heart failure) ([place])   . Depression    after losses  . Enlarged heart    managed by cardiology  . Fibroid   . Gestational diabetes   . Heart murmur   . Hypertension   . Infection    UTI  . Lupus ([place]) 1999    Patient Active Problem List   Diagnosis Date Noted  . ILD (interstitial lung disease) ([place]) 01/12/2019  . Coronary artery disease 12/12/2018  . Nonrheumatic aortic valve stenosis 11/19/2018  . Postinflammatory pulmonary fibrosis ([place]) 09/25/2018  . Hypokalemia 09/17/2018  . AKI (acute kidney injury) ([place]) 08/25/2018  . Encounter for annual routine gynecological examination 07/09/2018  . Abnormal vaginal Pap smear 07/09/2018  . Depo-Provera contraceptive status 07/09/2018  . Paroxysmal atrial fibrillation ([place]) 05/12/2018  . Atrial flutter ([place]) 05/12/2018  . Pericardial effusion 01/16/2018  . Chronic diastolic CHF (congestive heart failure) ([place]) 01/16/2018  . SOB (shortness of breath)  12/25/2017  . Hypertension 12/25/2017  . Microcytic anemia 12/25/2017  . Type 2 diabetes mellitus with hyperglycemia, without long-term current use of insulin ([place]) 12/25/2017  . Chronic pain of both knees 04/23/2017  . Fibroids 01/20/2015  . Menorrhagia 01/20/2015  . Low grade squamous intraepithelial lesion (LGSIL) on cervical Pap smear on 01/20/15 01/20/2015  . Atypical glandular cells on cervical Pap smear on 01/20/15 01/20/2015  . Lupus ([place]) 07/15/2013    Past Surgical History:  Procedure Laterality Date  . A-FLUTTER ABLATION N/A 06/10/2018   Procedure: A-FLUTTER ABLATION;  Surgeon: Evans Lance, MD;  Location: [facility];  Service: Cardiovascular;  Laterality: N/A;  . CARDIAC CATHETERIZATION    . CORONARY STENT INTERVENTION N/A 12/12/2018   Procedure: CORONARY STENT INTERVENTION;  Surgeon: [name], MD;  Location: [facility];  Service: Cardiovascular;  Laterality: N/A;  lad   . RIGHT/LEFT HEART CATH AND CORONARY ANGIOGRAPHY N/A 03/14/2018   Procedure: RIGHT/LEFT HEART CATH AND CORONARY ANGIOGRAPHY;  Surgeon: [name], MD;  Location: [facility];  Service: Cardiovascular;  Laterality: N/A;  . RIGHT/LEFT HEART CATH AND CORONARY ANGIOGRAPHY N/A 12/12/2018   Procedure: RIGHT/LEFT HEART CATH AND CORONARY ANGIOGRAPHY;  Surgeon: [name], MD;  Location: [facility];  Service: Cardiovascular;  Laterality: N/A;  . THERAPEUTIC ABORTION      OB History  Gravida  5   Para  2   Term  0   Preterm  2   AB  3   Living  2     SAB  2   TAB  1   Ectopic  0   Multiple  0   Live Births  2            Home Medications    Prior to Admission medications   Medication Sig Start Date End Date Taking? Authorizing Provider  azaTHIOprine (IMURAN) 50 MG tablet Take 50 mg by mouth 2 (two) times daily.   Yes [provider]  clopidogrel (PLAVIX) 75 MG tablet TAKE 1 TABLET BY MOUTH EVERY DAY WITH BREAKFAST 11/06/19   Yes Patwardhan, Manish J, MD  Colchicine 0.6 MG CAPS TAKE 1 CAPSULE BY MOUTH EVERY DAY 11/11/19  Yes Patwardhan, Manish J, MD  diltiazem (CARDIZEM CD) 240 MG 24 hr capsule TAKE 1 CAPSULE BY MOUTH EVERY DAY 02/01/20  Yes Patwardhan, Manish J, MD  ELIQUIS 5 MG TABS tablet TAKE 1 TABLET TWICE A DAY 09/11/19  Yes Patwardhan, Manish J, MD  hydroxychloroquine (PLAQUENIL) 200 MG tablet Take 1 tablet (200 mg total) by mouth 2 (two) times daily. 08/19/19  Yes Brand Males, MD  Insulin Glargine (BASAGLAR KWIKPEN) 100 UNIT/ML SOPN Inject 20 Units into the skin at bedtime. Inject 0.59m (20units) into skin at bedtime.    Yes [provider]  losartan (COZAAR) 50 MG tablet TAKE 1 TABLET BY MOUTH EVERY DAY 02/19/20  Yes GAdrian Prows MD  metoprolol tartrate (LOPRESSOR) 50 MG tablet TAKE 1 TABLET BY MOUTH TWICE A DAY 12/28/19  Yes Patwardhan, Manish J, MD  predniSONE (DELTASONE) 10 MG tablet 1 tablet x 1 week; then 1/2 tablet x 1 week; then stop 02/10/20  Yes WMartyn Ehrich NP  rosuvastatin (CRESTOR) 20 MG tablet TAKE 1 TABLET (20 MG TOTAL) BY MOUTH DAILY AT 6 PM. 09/16/19  Yes NCharlott Rakes MD  acetaminophen (TYLENOL) 500 MG tablet Take 1,000 mg by mouth every 6 (six) hours as needed for headache (pain).    [provider]  albuterol (VENTOLIN HFA) 108 (90 Base) MCG/ACT inhaler INHALE 2 PUFFS BY MOUTH EVERY 6 HOURS AS NEEDED FOR WHEEZING FOR SHORTNESS OF BREATH 01/07/20   NCharlott Rakes MD  Blood Glucose Monitoring Suppl (ONETOUCH VERIO) w/Device KIT 1 each by Does not apply route 3 (three) times daily. 06/25/19   FGildardo Pounds NP  cetirizine (ZYRTEC) 10 MG tablet Take 1 tablet (10 mg total) by mouth daily. 09/07/19 12/06/19  FGildardo Pounds NP  colchicine 0.6 MG tablet Take 1 tablet (0.6 mg total) by mouth daily. 12/16/18   Patwardhan, MReynold Bowen MD  cyclobenzaprine (FLEXERIL) 10 MG tablet Take 1 tablet (10 mg total) by mouth 2 (two) times daily as needed. 04/13/19   Fawze, Mina A, PA-C    doxycycline (VIBRAMYCIN) 100 MG capsule Take 1 capsule (100 mg total) by mouth 2 (two) times daily. 03/08/20   Emil Weigold, JMarguerita Beards PA-C  glucose blood (ONETOUCH VERIO) test strip Use as instructed 06/25/19   FGildardo Pounds NP  insulin aspart (NOVOLOG FLEXPEN) 100 UNIT/ML FlexPen Inject 10 Units into the skin 3 (three) times daily with meals. 06/05/19 09/03/19  FGildardo Pounds NP  insulin aspart (NOVOLOG) 100 UNIT/ML injection Inject into the skin 3 (three) times daily before meals. Inject 10 units in skin 3 times daily.    [provider]  Insulin Glargine (BASAGLAR KWIKPEN) 100 UNIT/ML  SOPN Inject 0.2 mLs (20 Units total) into the skin at bedtime. 06/05/19 09/03/19  Gildardo Pounds, NP  Insulin Pen Needle (TRUEPLUS PEN NEEDLES) 32G X 4 MM MISC Use as directed to inject insulin 5x daily 06/05/19   Gildardo Pounds, NP  Lancet Devices (ONE TOUCH DELICA LANCING DEV) MISC 1 each by Does not apply route 3 (three) times daily. 06/25/19   Gildardo Pounds, NP  liraglutide (VICTOZA) 18 MG/3ML SOPN Inject 0.3 mLs (1.8 mg total) into the skin every morning. Please make PCP appointment. 12/07/19   Charlott Rakes, MD  medroxyPROGESTERone (DEPO-PROVERA) 150 MG/ML injection Inject 150 mg into the muscle every 3 (three) months.    [provider]  OneTouch Delica Lancets 95A MISC 1 each by Does not apply route 3 (three) times daily. 06/25/19   Gildardo Pounds, NP  Phenyleph-CPM-DM-Aspirin (ALKA-SELTZER PLUS COLD & COUGH PO) Take 2 tablets by mouth every 4 (four) hours as needed (cough/congestion).    [provider]  Pirfenidone (ESBRIET) 267 MG TABS Take 1 tablet (267 mg total) by mouth 3 (three) times daily for 7 days, THEN 2 tablets (534 mg total) 3 (three) times daily for 7 days, THEN 3 tablets (801 mg total) 3 (three) times daily for 16 days. Patient not taking: Reported on 03/03/2020 02/18/20 03/19/20  Yopp, Amber C, RPH-CPP  traMADol (ULTRAM) 50 MG tablet Take 1 tablet by mouth as  needed. 02/16/19   [provider]  Vitamin D, Ergocalciferol, (DRISDOL) 1.25 MG (50000 UNIT) CAPS capsule Take 50,000 Units by mouth once a week.  10/29/19   [provider]    Family History Family History  Problem Relation Age of Onset  . Cancer Maternal Grandfather        lung  . Diabetes Mother   . Hypertension Mother   . Hypertension Maternal Grandmother   . Diabetes Father   . Hearing loss Neg Hx     Social History Social History   Tobacco Use  . Smoking status: Never Smoker  . Smokeless tobacco: Never Used  Vaping Use  . Vaping Use: Never used  Substance Use Topics  . Alcohol use: Yes    Comment: ocasionally   . Drug use: No     Allergies   Celery oil, Peanut-containing drug products, and Septra [bactrim]   Review of Systems Review of Systems   Physical Exam Triage Vital Signs ED Triage Vitals  Enc Vitals Group     BP 03/08/20 0842 (!) 134/88     Pulse Rate 03/08/20 0842 78     Resp 03/08/20 0842 16     Temp 03/08/20 0843 98.1 F (36.7 C)     Temp Source 03/08/20 0843 Oral     SpO2 03/08/20 0843 96 %     Weight --      Height --      Head Circumference --      Peak Flow --      Pain Score 03/08/20 0844 10     Pain Loc --      Pain Edu? --      Excl. in [place]? --    No data found.  Updated Vital Signs BP (!) 134/88   Pulse 78   Temp 98.1 F (36.7 C) (Oral)   Resp 16   SpO2 96%   Visual Acuity Right Eye Distance:   Left Eye Distance:   Bilateral Distance:    Right Eye Near:   Left Eye Near:  Bilateral Near:     Physical Exam Vitals and nursing note reviewed.  Constitutional:      General: She is not in acute distress.    Appearance: Normal appearance. She is not ill-appearing.  Cardiovascular:     Rate and Rhythm: Normal rate.  Pulmonary:     Effort: Pulmonary effort is normal. No respiratory distress.  Skin:    Comments: 2cm abscess in right axilla, no drainage, ttp.  Does appear to have some central  fluctuance.  4-5 follicular nodules in left axillar, no drainage, ttp  Neurological:     Mental Status: She is alert.      UC Treatments / Results  Labs (all labs ordered are listed, but only abnormal results are displayed) Labs Reviewed - No data to display  EKG   Radiology No results found.  Procedures Incision and Drainage  Date/Time: 03/08/2020 11:38 PM Performed by: Purnell Shoemaker, PA-C Authorized by: Purnell Shoemaker, PA-C   Consent:    Consent obtained:  Verbal   Consent given by:  Patient   Risks discussed:  Incomplete drainage and pain   Alternatives discussed:  Alternative treatment and referral Location:    Type:  Abscess   Size:  2cm   Location: Right axilla. Pre-procedure details:    Skin preparation:  Antiseptic wash Anesthesia (see MAR for exact dosages):    Anesthesia method:  Local infiltration   Local anesthetic:  Lidocaine 1% WITH epi Procedure type:    Complexity:  Simple Procedure details:    Needle aspiration: no     Incision types:  Single straight   Scalpel blade:  11   Wound management:  Probed and deloculated   Drainage:  Purulent and bloody   Drainage amount:  Moderate   Wound treatment:  Wound left open   Packing materials:  None Post-procedure details:    Patient tolerance of procedure:  Tolerated well, no immediate complications   (including critical care time) ID right axilla Medications Ordered in UC Medications - No data to display  Initial Impression / Assessment and Plan / UC Course  I have reviewed the triage vital signs and the nursing notes.  Pertinent labs & imaging results that were available during my care of the patient were reviewed by me and considered in my medical decision making (see chart for details).     #Abscess right axilla #Folliculitis left axilla Patient is 44 year old presenting with abscess right axilla and folliculitis left axilla.  Successful drainage of abscess in right axilla.  Feel that  antibiotics and warm compresses will cure lesions in left axilla.  Per chart review patient was placed on doxycycline previously with good effect.  We will treat with 10-day course.  Discussed return and fall precautions.  Wound care discussed.  Also discussed with patient that if this becomes a recurrent thing she should speak with her primary care about surgical referral.  Patient verbalized understanding plan of care. Final Clinical Impressions(s) / UC Diagnoses   Final diagnoses:  Abscess of axilla, right  Folliculitis of left axilla     Discharge Instructions     Take medicine as prescribed Clean woudn in right axilla daily, monitor for return of swelling. If increase swelling in either axilla over next 2-3 days, return or follow up with PCP  Apply warm compresses to left axilla daily      ED Prescriptions    Medication Sig Dispense Auth. Provider   doxycycline (VIBRAMYCIN) 100 MG capsule Take 1 capsule (100  mg total) by mouth 2 (two) times daily. 20 capsule Kalle Bernath, Marguerita Beards, PA-C     PDMP not reviewed this encounter.   Purnell Shoemaker, PA-C 03/08/20 2342

## 2020-03-08 NOTE — Discharge Instructions (Signed)
Take medicine as prescribed Clean woudn in right axilla daily, monitor for return of swelling. If increase swelling in either axilla over next 2-3 days, return or follow up with PCP  Apply warm compresses to left axilla daily

## 2020-03-09 ENCOUNTER — Encounter: Payer: Self-pay | Admitting: Student in an Organized Health Care Education/Training Program

## 2020-03-14 ENCOUNTER — Other Ambulatory Visit: Payer: Self-pay | Admitting: Nurse Practitioner

## 2020-03-14 DIAGNOSIS — E1165 Type 2 diabetes mellitus with hyperglycemia: Secondary | ICD-10-CM

## 2020-03-17 ENCOUNTER — Telehealth: Payer: Self-pay

## 2020-03-17 ENCOUNTER — Ambulatory Visit: Payer: Self-pay | Admitting: *Deleted

## 2020-03-17 NOTE — Telephone Encounter (Signed)
Received call from [facility] to clarify dose of insulin aspart ( novolog) for 10 units subcutaneous 3 times daily with meals.

## 2020-03-17 NOTE — Telephone Encounter (Signed)
NOVOLOG PA IS APPROVED THRU 03/17/21

## 2020-03-17 NOTE — Telephone Encounter (Signed)
VICTOZA PA WAS DENIED ON PT'S INS.  INS PREFERS [place]. IF APPROPRIATE, CAN YOU CHANGE THERAPY TO ONE OF THESE?

## 2020-03-17 NOTE — Telephone Encounter (Signed)
Will route to PCP 

## 2020-03-18 ENCOUNTER — Other Ambulatory Visit: Payer: Self-pay | Admitting: Student in an Organized Health Care Education/Training Program

## 2020-03-18 ENCOUNTER — Other Ambulatory Visit: Payer: Self-pay | Admitting: Family Medicine

## 2020-03-18 ENCOUNTER — Other Ambulatory Visit: Payer: Self-pay | Admitting: Nurse Practitioner

## 2020-03-21 ENCOUNTER — Other Ambulatory Visit: Payer: Self-pay | Admitting: Internal Medicine

## 2020-03-21 ENCOUNTER — Other Ambulatory Visit: Payer: Self-pay

## 2020-03-21 ENCOUNTER — Ambulatory Visit (INDEPENDENT_AMBULATORY_CARE_PROVIDER_SITE_OTHER): Payer: Self-pay | Admitting: Internal Medicine

## 2020-03-21 DIAGNOSIS — J8489 Other specified interstitial pulmonary diseases: Secondary | ICD-10-CM

## 2020-03-21 DIAGNOSIS — J849 Interstitial pulmonary disease, unspecified: Secondary | ICD-10-CM

## 2020-03-21 DIAGNOSIS — M359 Systemic involvement of connective tissue, unspecified: Secondary | ICD-10-CM

## 2020-03-21 LAB — PULMONARY FUNCTION TEST
DL/VA % pred: 80 %
DL/VA: 3.65 ml/min/mmHg/L
DLCO cor % pred: 46 %
DLCO cor: 8.68 ml/min/mmHg
DLCO unc % pred: 46 %
DLCO unc: 8.68 ml/min/mmHg
FEF 25-75 Pre: 2.4 L/sec
FEF2575-%Pred-Pre: 99 %
FEV1-%Pred-Pre: 64 %
FEV1-Pre: 1.36 L
FEV1FVC-%Pred-Pre: 111 %
FEV6-%Pred-Pre: 58 %
FEV6-Pre: 1.47 L
FEV6FVC-%Pred-Pre: 102 %
FVC-%Pred-Pre: 57 %
FVC-Pre: 1.47 L
Pre FEV1/FVC ratio: 92 %
Pre FEV6/FVC Ratio: 100 %

## 2020-03-21 NOTE — Progress Notes (Signed)
Spirometry and Dlco done today. 

## 2020-03-22 ENCOUNTER — Other Ambulatory Visit: Payer: Self-pay | Admitting: Student in an Organized Health Care Education/Training Program

## 2020-03-22 ENCOUNTER — Other Ambulatory Visit: Payer: Self-pay | Admitting: Nurse Practitioner

## 2020-03-22 ENCOUNTER — Telehealth: Payer: Self-pay | Admitting: Nurse Practitioner

## 2020-03-22 MED ORDER — TRULICITY 0.75 MG/0.5ML ~~LOC~~ SOAJ
0.7500 mg | SUBCUTANEOUS | 1 refills | Status: DC
Start: 2020-03-22 — End: 2020-04-19

## 2020-03-22 NOTE — Telephone Encounter (Signed)
Copied from [place] [id number]. Topic: General - Other >> Mar 22, 2020  9:54 AM Leward Quan A wrote: Reason for CRM: Patient called to request a call back from the nurse in reference to her insuline that have been at the pharmacy over a week now awaiting prior authorization. Patient can be reached at  Ph# [phone]

## 2020-03-22 NOTE — Telephone Encounter (Signed)
Novolog PA was approved. I believe pt is referring to Victoza. PA for Victoza was denied, stating insurance prefers weekly Trulicity. Will route to PCP to change if appropriate.

## 2020-03-22 NOTE — Telephone Encounter (Signed)
Thank you [name]. I sent the lowest dose Trulicity

## 2020-03-24 ENCOUNTER — Ambulatory Visit: Payer: PRIVATE HEALTH INSURANCE | Admitting: Student in an Organized Health Care Education/Training Program

## 2020-03-24 MED FILL — TRULICITY 0.75 MG/0.5 ML PE: 0.75 | 30 days supply | Qty: 2 | Fill #0

## 2020-03-25 ENCOUNTER — Telehealth: Payer: Self-pay | Admitting: Nurse Practitioner

## 2020-03-25 ENCOUNTER — Other Ambulatory Visit: Payer: Self-pay | Admitting: Nurse Practitioner

## 2020-03-25 ENCOUNTER — Encounter: Payer: Self-pay | Admitting: Nurse Practitioner

## 2020-03-25 DIAGNOSIS — E1165 Type 2 diabetes mellitus with hyperglycemia: Secondary | ICD-10-CM

## 2020-03-25 MED ORDER — FREESTYLE LIBRE 2 SENSOR MISC: 6 refills | Status: DC

## 2020-03-25 MED ORDER — FREESTYLE LIBRE 2 READER DEVI: 1 refills | Status: DC

## 2020-03-25 MED ORDER — NOVOLOG FLEXPEN 100 UNIT/ML ~~LOC~~ SOPN: PEN_INJECTOR | SUBCUTANEOUS | 3 refills | Status: DC

## 2020-03-25 NOTE — Telephone Encounter (Signed)
Please advise.   Copied from [place] [id number]. Topic: General - Inquiry >> Mar 24, 2020 11:08 AM Alease Frame wrote: Reason for CRM: Pt would like a call back regarding medication . Messages on chart concerning . Please advise

## 2020-03-25 NOTE — Telephone Encounter (Signed)
Attempt to reach patient to inform on PCP advising. Pt. Did not answer and LVM. PCP responded back to paitent on MyChart.

## 2020-03-25 NOTE — Telephone Encounter (Signed)
° °  Notes to clinic:   Alternative Requested:PATIENT'S INSURANCE DOES NOT [place] ALTERNATIVE= HUMALOG  Requested Prescriptions  Pending Prescriptions Disp Refills   LANTUS SOLOSTAR 100 UNIT/ML Solostar Pen [Pharmacy Med Name: LANTUS SOLOSTAR 100 UNIT/ML] 15 mL 0      Endocrinology:  Diabetes - Insulins Failed - 03/25/2020  2:29 PM      Failed - HBA1C is between 0 and 7.9 and within 180 days    Hgb A1c MFr Bld  Date Value Ref Range Status  09/07/2019 6.8 (H) 4.8 - 5.6 % Final    Comment:             Prediabetes: 5.7 - 6.4          Diabetes: >6.4          Glycemic control for adults with diabetes: <7.0           Failed - Valid encounter within last 6 months    Recent Outpatient Visits           6 months ago Cutaneous abscess of right axilla   [place], [place] W, NP   8 months ago Type 2 diabetes mellitus with hyperglycemia, without long-term current use of insulin [facility])   [place], Annie Main L, RPH-CPP   9 months ago Type 2 diabetes mellitus with hyperglycemia, without long-term current use of insulin [facility])   [place], Vernia Buff, NP   1 year ago RLQ abdominal pain   [place], [place], [place]   1 year ago Shortness of breath   [place], Vernia Buff, NP       Future Appointments             In 4 days Brand Males, MD [facility]   In 3 weeks Gildardo Pounds, NP [place]   In 4 weeks Patwardhan, Reynold Bowen, MD [facility]

## 2020-03-26 ENCOUNTER — Telehealth: Payer: Self-pay

## 2020-03-26 NOTE — Telephone Encounter (Signed)
TRULICITY PA APPROVED THRU 03/26/21

## 2020-03-27 NOTE — Progress Notes (Signed)
Will discuss during visit 03/29/20

## 2020-03-29 ENCOUNTER — Ambulatory Visit: Payer: 59 | Admitting: Internal Medicine

## 2020-04-04 ENCOUNTER — Telehealth: Payer: Self-pay | Admitting: Internal Medicine

## 2020-04-04 ENCOUNTER — Other Ambulatory Visit: Payer: Self-pay

## 2020-04-04 MED ORDER — METOPROLOL TARTRATE 50 MG PO TABS: 50.0000 mg | ORAL_TABLET | Freq: Two times a day (BID) | ORAL | 1 refills | Status: DC

## 2020-04-04 NOTE — Telephone Encounter (Signed)
Primary Pulmonologist: Dr.Ramaswamy Last office visit and with whom: 02/10/20 Video w/Beth What do we see them for (pulmonary problems): ILD Last OV assessment/plan: See Below  Was appointment offered to patient (explain)?  No    Reason for call: Called and spoke with patient she is having cough that is dry/productive with clear sputum and sob. Her rheumatologist gave her a Prednisone taper on 03/30/2020 and she is not noticing a difference. Patient denies fever,headaches. Patient states that her symptoms started around August 14-15. Patient is already scheduled to see Tammy tomorrow at 2pm. Dr. Chase Caller any other recommendations?   (examples of things to ask: : When did symptoms start? Fever? Cough? Productive? Color to sputum? More sputum than usual? Wheezing? Have you needed increased oxygen? Are you taking your respiratory medications? What over the counter measures have you tried?)  Allergies  Allergen Reactions  . Celery Oil Shortness Of Breath, Itching, Rash and Other (See Comments)    Bumps on tongue, also  . Peanut-Containing Drug Products Anaphylaxis, Shortness Of Breath, Itching, Rash and Other (See Comments)    Bumps on tongue, also  . Septra [Bactrim] Hives    Immunization History  Administered Date(s) Administered  . Influenza,inj,Quad PF,6+ Mos 05/25/2013, 05/14/2018, 05/13/2019  . PFIZER SARS-COV-2 Vaccination 11/20/2019, 12/15/2019  . Pneumococcal Polysaccharide-23 05/14/2018  . Tdap 07/09/2018

## 2020-04-04 NOTE — Telephone Encounter (Signed)
If worse go to ER. Or else see [name] 04/05/20

## 2020-04-04 NOTE — Telephone Encounter (Signed)
Spoke with patient and provided advise per Dr. Chase Caller.  She verbalized understanding.  She will see Rexene Edison NP on 8/24.  Nothing further needed.

## 2020-04-05 ENCOUNTER — Other Ambulatory Visit: Payer: Self-pay

## 2020-04-05 ENCOUNTER — Encounter: Payer: Self-pay | Admitting: Adult Health

## 2020-04-05 ENCOUNTER — Ambulatory Visit (INDEPENDENT_AMBULATORY_CARE_PROVIDER_SITE_OTHER): Payer: Medicaid Other

## 2020-04-05 ENCOUNTER — Ambulatory Visit: Payer: Medicaid Other | Admitting: Adult Health

## 2020-04-05 ENCOUNTER — Ambulatory Visit: Payer: Self-pay | Admitting: Adult Health

## 2020-04-05 VITALS — BP 130/70 | HR 109 | Temp 98.7°F | Ht 60.0 in | Wt 225.8 lb

## 2020-04-05 DIAGNOSIS — J849 Interstitial pulmonary disease, unspecified: Secondary | ICD-10-CM

## 2020-04-05 DIAGNOSIS — E1165 Type 2 diabetes mellitus with hyperglycemia: Secondary | ICD-10-CM

## 2020-04-05 MED ORDER — AMOXICILLIN-POT CLAVULANATE 875-125 MG PO TABS: 1.0000 | ORAL_TABLET | Freq: Two times a day (BID) | ORAL | 0 refills | Status: AC

## 2020-04-05 MED ORDER — PREDNISONE 20 MG PO TABS: 20.0000 mg | ORAL_TABLET | Freq: Every day | ORAL | 0 refills | Status: DC

## 2020-04-05 NOTE — Patient Instructions (Addendum)
Augmentin 875mg  Twice daily for 7 days , take with food  Check sputum culture .  Taper prednisone as directed to 20mg  daily and hold at this dose for now .  Delsym 2 tsp Twice daily  As needed  Cough /congestion .  Chest xray today  Discuss with PCP if Blood sugars are >250.  Follow up with Dr. Chase Caller in 2 weeks and As needed   Please contact office for sooner follow up if symptoms do not improve or worsen or seek emergency care

## 2020-04-05 NOTE — Assessment & Plan Note (Signed)
Patient education on diabetes and steroids.  Patient continue to check blood sugars and report blood sugars greater than 250.

## 2020-04-05 NOTE — Addendum Note (Signed)
Addended by: Suzzanne Cloud E on: 04/05/2020 03:08 PM   Modules accepted: Orders

## 2020-04-05 NOTE — Assessment & Plan Note (Signed)
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  Patient had an acute flare June 2021.  Improvement on steroids however flare of symptoms off of steroids.  Recent pulmonary function testing shows a decline in lung function consistent with progressive disease.  CT chest done in June showed chronic changes.  However was not a HRCT chest. For now will continue with steroids treat with antibiotics.  Check a sputum culture.  Check chest x-ray today have close follow-up in 2 weeks.  Decide on repeat HRCT chest  Consider restarting Esbriet on return .   Plan  Patient Instructions  Augmentin 875mg  Twice daily for 7 days , take with food  Check sputum culture .  Taper prednisone as directed to 20mg  daily and hold at this dose for now .  Delsym 2 tsp Twice daily  As needed  Cough /congestion .  Chest xray today  Discuss with PCP if Blood sugars are >250.  Follow up with Dr. Chase Caller in 2 weeks and As needed   Please contact office for sooner follow up if symptoms do not improve or worsen or seek emergency care

## 2020-04-05 NOTE — Progress Notes (Signed)
_0  ID: [name], female    DOB: 03-Nov-1975, 44 y.o.   MRN: [id number]  Chief Complaint  Patient presents with  . 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 Amil Amen Pericardial effusion and pulmonary hypertension history  TEST/EVENTS :  ILD  started pirfenidone July 09, 2019             - enrolled ILD-Pro registry study Jan 2021 On Pllaquenil since 2000             - On immuran since early 2020             - s.p prednisone 2018 - 2020 intermittnet only - significant side effects +  2D echo October 16, 2019 normal EF 55 to 60%, moderate left ventricular hypertrophy.  EF 54%.  Moderate pericardial effusion with no hemodynamic compromise.   04/05/2020 Follow up : ILD -connective tissue disease related.  Patient presents for an acute visit.  Patient complains last few weeks her breathing has not been doing as well.  She was hospitalized earlier this summer and says she was doing better she was discharged on a prednisone.  Shortly after getting off of prednisone she said her cough started to return.  In the last 2 weeks she has had increased cough intermittent congestion and increased shortness of breath.  She was recently seen by rheumatology and placed back on prednisone she is currently on a tapering dose. Remains on Plaquenil and Imuran.  Chronic immunosuppression.  She denies any fever, chest pain, without.  Covid vaccines are up-to-date.  Hospitalized June 2021 in [place].  Records noted negative CT chest for PE.  Positive multifocal involvement with extensive bilateral patchy groundglass lung opacification.  She was treated with IV steroids, antibiotics.  Required IV diuresis.  ANA titers, nucleolar pattern, anti-RNP  and SM RNP antibodies were consistent with flare of lupus she was treated with a steroid taper at discharge.  Currently on prednisone Esbriet was held since discharge from hospital in June.  Pulmonary function testing completed March 21, 2020.  Shows a decline in lung function with FEV1 at 64%, ratio 92, FVC 57%, DLCO 46%.    Allergies  Allergen Reactions  . Celery Oil Shortness Of Breath, Itching, Rash and Other (See Comments)    Bumps on tongue, also  . Peanut-Containing Drug Products Anaphylaxis, Shortness Of Breath, Itching, Rash and Other (See Comments)    Bumps on tongue, also  . Septra [Bactrim] Hives  . Sulfamethoxazole-Trimethoprim Hives    Created by Conversion - 0;      Immunization History  Administered Date(s) Administered  . Influenza,inj,Quad PF,6+ Mos 05/25/2013, 05/14/2018, 05/13/2019  . PFIZER SARS-COV-2 Vaccination 11/20/2019, 12/15/2019  . Pneumococcal Polysaccharide-23 05/14/2018  . Tdap 07/09/2018    Past Medical History:  Diagnosis Date  . Abnormal Pap smear of cervix    colpo, HPV  . Allergy   . Anemia   . Atrial fibrillation ([place])   . CHF (congestive heart failure) ([place])   . Depression    after losses  . Enlarged heart    managed by cardiology  . Fibroid   . Gestational diabetes   . Heart murmur   . Hypertension   . Infection    UTI  . Lupus (  [place]) 1999    Tobacco History: Social History   Tobacco Use  Smoking Status Never Smoker  Smokeless Tobacco Never Used   Counseling given: Not Answered   Outpatient Medications Prior to Visit  Medication Sig Dispense Refill  . acetaminophen (TYLENOL) 500 MG tablet Take 1,000 mg by mouth every 6 (six) hours as needed for headache (pain).    Marland Kitchen albuterol (VENTOLIN HFA) 108 (90 Base) MCG/ACT inhaler INHALE 2 PUFFS BY MOUTH EVERY 6 HOURS AS NEEDED FOR WHEEZING FOR SHORTNESS OF BREATH 9 g 0  . azaTHIOprine (IMURAN) 50 MG tablet Take 50 mg by mouth 2 (two) times daily.    . Blood Glucose Monitoring  Suppl (ONETOUCH VERIO) w/Device KIT 1 each by Does not apply route 3 (three) times daily. 1 kit 0  . clopidogrel (PLAVIX) 75 MG tablet TAKE 1 TABLET BY MOUTH EVERY DAY WITH BREAKFAST 90 tablet 1  . Colchicine 0.6 MG CAPS TAKE 1 CAPSULE BY MOUTH EVERY DAY 30 capsule 3  . colchicine 0.6 MG tablet Take 1 tablet (0.6 mg total) by mouth daily. 60 tablet 1  . Continuous Blood Gluc Receiver (FREESTYLE LIBRE 2 READER) DEVI Use as instructed. Check blood glucose levels five times per day per day. E11.65 1 each 1  . Continuous Blood Gluc Sensor (FREESTYLE LIBRE 2 SENSOR) MISC Use as instructed. Check blood glucose levels 5 times per day. E11.65 1 each 6  . cyclobenzaprine (FLEXERIL) 10 MG tablet Take 1 tablet (10 mg total) by mouth 2 (two) times daily as needed. 10 tablet 0  . diltiazem (CARDIZEM CD) 240 MG 24 hr capsule TAKE 1 CAPSULE BY MOUTH EVERY DAY 90 capsule 2  . diltiazem (TIAZAC) 240 MG 24 hr capsule Take by mouth.    . doxycycline (VIBRAMYCIN) 100 MG capsule Take 1 capsule (100 mg total) by mouth 2 (two) times daily. 20 capsule 0  . Dulaglutide (TRULICITY) 5.10 CH/8.5ID SOPN Inject 0.5 mLs (0.75 mg total) into the skin once a week. 6 mL 1  . ELIQUIS 5 MG TABS tablet TAKE 1 TABLET TWICE A DAY 180 tablet 2  . furosemide (LASIX) 40 MG tablet Take 1 tablet by mouth 2 (two) times daily.    Marland Kitchen glucose blood (ONETOUCH VERIO) test strip Use as instructed 100 each 12  . hydroxychloroquine (PLAQUENIL) 200 MG tablet Take 1 tablet (200 mg total) by mouth 2 (two) times daily. 60 tablet 4  . insulin glargine (LANTUS SOLOSTAR) 100 UNIT/ML Solostar Pen Inject 20 Units into the skin daily. 15 mL 0  . Insulin Pen Needle (TRUEPLUS PEN NEEDLES) 32G X 4 MM MISC Use as directed to inject insulin 5x daily 200 each 3  . Lancet Devices (ONE TOUCH DELICA LANCING DEV) MISC 1 each by Does not apply route 3 (three) times daily. 1 each 1  . liraglutide (VICTOZA) 18 MG/3ML SOPN Inject into the skin.    Marland Kitchen losartan (COZAAR) 50 MG  tablet TAKE 1 TABLET BY MOUTH EVERY DAY 90 tablet 3  . medroxyPROGESTERone (DEPO-PROVERA) 150 MG/ML injection Inject 150 mg into the muscle every 3 (three) months.    . metoprolol tartrate (LOPRESSOR) 50 MG tablet Take 1 tablet (50 mg total) by mouth 2 (two) times daily. 180 tablet 1  . OneTouch Delica Lancets 78E MISC 1 each by Does not apply route 3 (three) times daily. 100 each 12  . OXYGEN 1 L/min to keep O2 sat above 92% with ambulation    . Phenyleph-CPM-DM-Aspirin (ALKA-SELTZER PLUS COLD & COUGH  PO) Take 2 tablets by mouth every 4 (four) hours as needed (cough/congestion).    . predniSONE (DELTASONE) 10 MG tablet 1 tablet x 1 week; then 1/2 tablet x 1 week; then stop 14 tablet 0  . rosuvastatin (CRESTOR) 20 MG tablet TAKE 1 TABLET BY MOUTH EVERY DAY AT 6PM 90 tablet 1  . traMADol (ULTRAM) 50 MG tablet Take 1 tablet by mouth as needed.    . Vitamin D, Ergocalciferol, (DRISDOL) 1.25 MG (50000 UNIT) CAPS capsule Take 50,000 Units by mouth once a week.     . cetirizine (ZYRTEC) 10 MG tablet Take 1 tablet (10 mg total) by mouth daily. 90 tablet 1   No facility-administered medications prior to visit.     Review of Systems:   Constitutional:   No  weight loss, night sweats,  Fevers, chills,  +fatigue, or  lassitude.  HEENT:   No headaches,  Difficulty swallowing,  Tooth/dental problems, or  Sore throat,                No sneezing, itching, ear ache, nasal congestion, post nasal drip,   CV:  No chest pain,  Orthopnea, PND, swelling in lower extremities, anasarca, dizziness, palpitations, syncope.   GI  No heartburn, indigestion, abdominal pain, nausea, vomiting, diarrhea, change in bowel habits, loss of appetite, bloody stools.   Resp: .  No chest wall deformity  Skin: no rash or lesions.  GU: no dysuria, change in color of urine, no urgency or frequency.  No flank pain, no hematuria   MS:  No joint pain or swelling.  No decreased range of motion.  No back pain.    Physical  Exam  BP 130/70 (BP Location: Left Arm, Cuff Size: Normal)   Pulse (!) 109   Temp 98.7 F (37.1 C) (Oral)   Ht 5' (1.524 m)   Wt 225 lb 12.8 oz (102.4 kg)   SpO2 91%   BMI 44.10 kg/m   GEN: A/Ox3; pleasant , NAD, BMI 44   HEENT:  [place]/AT,  l, NOSE-clear, THROAT-clear, no lesions, no postnasal drip or exudate noted.   NECK:  Supple w/ fair ROM; no JVD; normal carotid impulses w/o bruits; no thyromegaly or nodules palpated; no lymphadenopathy.    RESP  Clear  P & A; w/o, wheezes/ rales/ or rhonchi. no accessory muscle use, no dullness to percussion  CARD:  RRR, no m/r/g, tr peripheral edema, pulses intact, no cyanosis or clubbing.  GI:   Soft & nt; nml bowel sounds; no organomegaly or masses detected.   Musco: Warm bil, no deformities or joint swelling noted.   Neuro: alert, no focal deficits noted.    Skin: Warm, no lesions or rashes    Lab Results:   BMET ProBNP  Imaging: No results found.  medroxyPROGESTERone (DEPO-PROVERA) injection 150 mg    Date Action Dose Route User   Discharged on 03/08/2020   Admitted on 03/08/2020   03/03/2020 0858 Given  Intramuscular (Left Deltoid) Michel Harrow, RN      PFT Results Latest Ref Rng & Units 03/21/2020 05/12/2019  FVC-Pre L 1.47 1.65  FVC-Predicted Pre % 57 63  FVC-Post L - 1.69  FVC-Predicted Post % - 65  Pre FEV1/FVC % % 92 90  Post FEV1/FCV % % - 91  FEV1-Pre L 1.36 1.48  FEV1-Predicted Pre % 64 69  FEV1-Post L - 1.54  DLCO uncorrected ml/min/mmHg 8.68 10.98  DLCO UNC% % 46 58  DLCO corrected ml/min/mmHg 8.68 -  DLCO COR %Predicted % 46 -  DLVA Predicted % 80 93  TLC L - 2.72  TLC % Predicted % - 61  RV % Predicted % - 72    No results found for: NITRICOXIDE      Assessment & Plan:   ILD (interstitial lung disease) (HCC) Progressive interstitial lung disease related to connective tissue disease.  Patient had an acute flare June 2021.  Improvement on steroids however flare of symptoms off of  steroids.  Recent pulmonary function testing shows a decline in lung function consistent with progressive disease.  CT chest done in June showed chronic changes.  However was not a HRCT chest. For now will continue with steroids treat with antibiotics.  Check a sputum culture.  Check chest x-ray today have close follow-up in 2 weeks.  Decide on repeat HRCT chest  Consider restarting Esbriet on return .   Plan  Patient Instructions  Augmentin 836m Twice daily for 7 days , take with food  Check sputum culture .  Taper prednisone as directed to 232mdaily and hold at this dose for now .  Delsym 2 tsp Twice daily  As needed  Cough /congestion .  Chest xray today  Discuss with PCP if Blood sugars are >250.  Follow up with Dr. RaChase Callern 2 weeks and As needed   Please contact office for sooner follow up if symptoms do not improve or worsen or seek emergency care        Type 2 diabetes mellitus with hyperglycemia, without long-term current use of insulin ([facility] education on diabetes and steroids.  Patient continue to check blood sugars and report blood sugars greater than 250.     TaRexene EdisonNP 04/05/2020

## 2020-04-06 NOTE — Progress Notes (Signed)
Tried calling the pt and there was no answer- LMTCB.  

## 2020-04-08 LAB — RESPIRATORY CULTURE OR RESPIRATORY AND SPUTUM CULTURE
MICRO NUMBER:: 10864962
RESULT:: NORMAL
SPECIMEN QUALITY:: ADEQUATE

## 2020-04-19 ENCOUNTER — Encounter: Payer: Self-pay | Admitting: Nurse Practitioner

## 2020-04-19 ENCOUNTER — Ambulatory Visit: Payer: 59 | Attending: Nurse Practitioner | Admitting: Nurse Practitioner

## 2020-04-19 ENCOUNTER — Other Ambulatory Visit: Payer: Self-pay

## 2020-04-19 VITALS — BP 116/84 | HR 89 | Temp 97.7°F | Wt 225.8 lb

## 2020-04-19 DIAGNOSIS — J849 Interstitial pulmonary disease, unspecified: Secondary | ICD-10-CM | POA: Insufficient documentation

## 2020-04-19 DIAGNOSIS — M329 Systemic lupus erythematosus, unspecified: Secondary | ICD-10-CM | POA: Insufficient documentation

## 2020-04-19 DIAGNOSIS — Z881 Allergy status to other antibiotic agents status: Secondary | ICD-10-CM | POA: Insufficient documentation

## 2020-04-19 DIAGNOSIS — Z1231 Encounter for screening mammogram for malignant neoplasm of breast: Secondary | ICD-10-CM

## 2020-04-19 DIAGNOSIS — N921 Excessive and frequent menstruation with irregular cycle: Secondary | ICD-10-CM

## 2020-04-19 DIAGNOSIS — I4891 Unspecified atrial fibrillation: Secondary | ICD-10-CM | POA: Insufficient documentation

## 2020-04-19 DIAGNOSIS — I10 Essential (primary) hypertension: Secondary | ICD-10-CM

## 2020-04-19 DIAGNOSIS — E1165 Type 2 diabetes mellitus with hyperglycemia: Secondary | ICD-10-CM

## 2020-04-19 DIAGNOSIS — Z9189 Other specified personal risk factors, not elsewhere classified: Secondary | ICD-10-CM

## 2020-04-19 DIAGNOSIS — Z79899 Other long term (current) drug therapy: Secondary | ICD-10-CM | POA: Insufficient documentation

## 2020-04-19 DIAGNOSIS — I509 Heart failure, unspecified: Secondary | ICD-10-CM | POA: Insufficient documentation

## 2020-04-19 DIAGNOSIS — Z955 Presence of coronary angioplasty implant and graft: Secondary | ICD-10-CM | POA: Insufficient documentation

## 2020-04-19 DIAGNOSIS — Z833 Family history of diabetes mellitus: Secondary | ICD-10-CM | POA: Insufficient documentation

## 2020-04-19 DIAGNOSIS — Z7952 Long term (current) use of systemic steroids: Secondary | ICD-10-CM | POA: Insufficient documentation

## 2020-04-19 DIAGNOSIS — Z793 Long term (current) use of hormonal contraceptives: Secondary | ICD-10-CM | POA: Insufficient documentation

## 2020-04-19 DIAGNOSIS — I110 Hypertensive heart disease with heart failure: Secondary | ICD-10-CM | POA: Insufficient documentation

## 2020-04-19 DIAGNOSIS — E785 Hyperlipidemia, unspecified: Secondary | ICD-10-CM | POA: Diagnosis not present

## 2020-04-19 DIAGNOSIS — Z7901 Long term (current) use of anticoagulants: Secondary | ICD-10-CM | POA: Insufficient documentation

## 2020-04-19 DIAGNOSIS — Z882 Allergy status to sulfonamides status: Secondary | ICD-10-CM | POA: Insufficient documentation

## 2020-04-19 DIAGNOSIS — Z794 Long term (current) use of insulin: Secondary | ICD-10-CM | POA: Insufficient documentation

## 2020-04-19 LAB — POCT GLYCOSYLATED HEMOGLOBIN (HGB A1C): Hemoglobin A1C: 11.1 % — AB (ref 4.0–5.6)

## 2020-04-19 LAB — GLUCOSE, POCT (MANUAL RESULT ENTRY)
POC Glucose: 423 mg/dl — AB (ref 70–99)
POC Glucose: 430 mg/dl — AB (ref 70–99)

## 2020-04-19 MED ORDER — INSULIN ASPART 100 UNIT/ML ~~LOC~~ SOLN
20.0000 [IU] | Freq: Once | SUBCUTANEOUS | Status: AC
  Administered 2020-04-19: 20 [IU] via SUBCUTANEOUS

## 2020-04-19 MED ORDER — ROSUVASTATIN CALCIUM 20 MG PO TABS: ORAL_TABLET | ORAL | 1 refills | Status: AC

## 2020-04-19 MED ORDER — TRUEPLUS PEN NEEDLES 32G X 4 MM MISC: 3 refills | Status: AC

## 2020-04-19 MED ORDER — FREESTYLE LIBRE 2 SENSOR MISC: 6 refills | Status: DC

## 2020-04-19 MED ORDER — BASAGLAR KWIKPEN 100 UNIT/ML ~~LOC~~ SOPN: 20.0000 [IU] | PEN_INJECTOR | Freq: Every day | SUBCUTANEOUS | 6 refills | Status: DC

## 2020-04-19 MED ORDER — FREESTYLE LIBRE 2 READER DEVI: 1 refills | Status: AC

## 2020-04-19 MED ORDER — INSULIN LISPRO (1 UNIT DIAL) 100 UNIT/ML (KWIKPEN): PEN_INJECTOR | SUBCUTANEOUS | 11 refills | Status: DC

## 2020-04-19 MED ORDER — TRULICITY 0.75 MG/0.5ML ~~LOC~~ SOAJ: 0.7500 mg | SUBCUTANEOUS | 1 refills | Status: DC

## 2020-04-19 MED FILL — HUMALOG 100 UNITS/ML KWIKPE: 100 | 25 days supply | Qty: 3 | Fill #0

## 2020-04-19 MED FILL — TRUEplus 5-BEVEL PEN NEEDLE: 32G X 4 MM | 25 days supply | Qty: 100 | Fill #0

## 2020-04-19 MED FILL — TRULICITY 0.75 MG/0.5 ML PE: 0.75 | 28 days supply | Qty: 2 | Fill #0

## 2020-04-19 NOTE — Progress Notes (Signed)
Assessment & Plan:  [name] was seen today for diabetes.  Diagnoses and all orders for this visit:  Type 2 diabetes mellitus with hyperglycemia, without long-term current use of insulin (HCC) -     Glucose (CBG) -     HgB A1c -     Microalbumin/Creatinine Ratio, Urine -     insulin aspart (novoLOG) injection 20 Units -     insulin lispro (HUMALOG KWIKPEN) 100 UNIT/ML KwikPen; For blood sugars 0-150 give 0 units of insulin, 151-200 give 2 units of insulin, 201-250 give 4 units, 251-300 give 6 units, 301-350 give 8 units, 351-400 give 10 units,> 400 give 12 units and call office. Discussed hypoglycemia protocol. -     Dulaglutide (TRULICITY) 8.67 EH/2.0NO SOPN; Inject 0.5 mLs (0.75 mg total) into the skin once a week. -     Insulin Pen Needle (TRUEPLUS PEN NEEDLES) 32G X 4 MM MISC; Use as directed to inject insulin 5x daily -     Continuous Blood Gluc Receiver (FREESTYLE LIBRE 2 READER) DEVI; Use as instructed. Check blood glucose levels five times per day per day. E11.65 Z91.89 -     Continuous Blood Gluc Sensor (FREESTYLE LIBRE 2 SENSOR) MISC; Use as instructed. Check blood glucose levels 5 times per day. E11.65 Z91.89 -     Insulin Glargine (BASAGLAR KWIKPEN) 100 UNIT/ML; Inject 0.2 mLs (20 Units total) into the skin daily. E11.65. may increase by 2 units every 3 days for fasting blood glucose readings greater than 130. -     CMP14+EGFR -     Ambulatory referral to Ophthalmology -     Glucose (CBG) Continue blood sugar control as discussed in office today, low carbohydrate diet, and regular physical exercise as tolerated, 150 minutes per week (30 min each day, 5 days per week, or 50 min 3 days per week). Keep blood sugar logs with fasting goal of 90-130 mg/dl, post prandial (after you eat) less than 180.  For Hypoglycemia: BS <60 and Hyperglycemia BS >400; contact the clinic ASAP. Annual eye exams and foot exams are recommended.   Dyslipidemia, goal LDL below 70 -     rosuvastatin  (CRESTOR) 20 MG tablet; TAKE 1 TABLET BY MOUTH EVERY DAY AT 6PM -     Lipid panel INSTRUCTIONS: Work on a low fat, heart healthy diet and participate in regular aerobic exercise program by working out at least 150 minutes per week; 5 days a week-30 minutes per day. Avoid red meat/beef/steak,  fried foods. junk foods, sodas, sugary drinks, unhealthy snacking, alcohol and smoking.  Drink at least 80 oz of water per day and monitor your carbohydrate intake daily.    Essential hypertension -     CMP14+EGFR -     Lipid panel Continue all antihypertensives as prescribed.  Remember to bring in your blood pressure log with you for your follow up appointment.  DASH/Mediterranean Diets are healthier choices for HTN.    Menorrhagia with irregular cycle -     CBC  Breast cancer screening by mammogram -     MM 3D SCREEN BREAST BILATERAL; Future    Patient has been counseled on age-appropriate routine health concerns for screening and prevention. These are reviewed and up-to-date. Referrals have been placed accordingly. Immunizations are up-to-date or declined.    Subjective:   Chief Complaint  Patient presents with   Diabetes    Pt. is here for diabetes follow up.    HPI GERMANY DODGEN 44 y.o. female presents  to office today for follow up. PMH:  Abnormal Pap smear of cervix, Allergy, Anemia, Atrial fibrillation CHF, ILD,  Depression, Fibroid,  Heart murmur, Hypertension, Lupus, Pericarditis  She is currently being closely followed by Rheumatology, Pulmonology and Cardiology.   DM TYPE 2 She was recently started back on prednisone for her ILD. Blood glucose levels have been elevated. A1c has incraesed from 6.8 to 11.1. Currently taking trulicity 6.19 mg weekly, basaglar 20 units daily. Will start humalog on sliding scale to help improve blood glucose levels.  Lab Results  Component Value Date   HGBA1C 11.1 (A) 04/19/2020   Lab Results  Component Value Date   HGBA1C 6.8 (H) 09/07/2019    Dyslipidemia LDL not at goal. She is taking crestor 20 mg daily as prescribed.  Lab Results  Component Value Date   LDLCALC 79 04/19/2020    Essential Hypertension Blood pressure is well controlled. Taking losartan 50 mg daily, lopressor 50 mg BID, cardizem 240 mg daily and furosemide 40 mg daily. Denies chest pain, shortness of breath, palpitations, lightheadedness, dizziness, headaches. BP Readings from Last 3 Encounters:  04/19/20 116/84  04/05/20 130/70  03/08/20 (!) 134/88   Review of Systems  Constitutional: Negative for fever, malaise/fatigue and weight loss.  HENT: Negative.  Negative for nosebleeds.   Eyes: Negative.  Negative for blurred vision, double vision and photophobia.  Respiratory: Positive for shortness of breath (chronic with exertion). Negative for cough.   Cardiovascular: Negative.  Negative for chest pain, palpitations and leg swelling.  Gastrointestinal: Negative.  Negative for heartburn, nausea and vomiting.  Musculoskeletal: Negative.  Negative for myalgias.  Neurological: Negative.  Negative for dizziness, focal weakness, seizures and headaches.  Endo/Heme/Allergies: Positive for environmental allergies.  Psychiatric/Behavioral: Negative.  Negative for suicidal ideas.    Past Medical History:  Diagnosis Date   Abnormal Pap smear of cervix    colpo, HPV   Allergy    Anemia    Atrial fibrillation (HCC)    CHF (congestive heart failure) ([place])    Depression    after losses   Enlarged heart    managed by cardiology   Fibroid    Gestational diabetes    Heart murmur    Hypertension    Infection    UTI   Lupus ([place]) 1999   Pericarditis     Past Surgical History:  Procedure Laterality Date   A-FLUTTER ABLATION N/A 06/10/2018   Procedure: A-FLUTTER ABLATION;  Surgeon: Evans Lance, MD;  Location: [facility];  Service: Cardiovascular;  Laterality: N/A;   CARDIAC CATHETERIZATION     CORONARY STENT INTERVENTION N/A  12/12/2018   Procedure: CORONARY STENT INTERVENTION;  Surgeon: [name], MD;  Location: [facility];  Service: Cardiovascular;  Laterality: N/A;  lad    RIGHT/LEFT HEART CATH AND CORONARY ANGIOGRAPHY N/A 03/14/2018   Procedure: RIGHT/LEFT HEART CATH AND CORONARY ANGIOGRAPHY;  Surgeon: [name], MD;  Location: [facility];  Service: Cardiovascular;  Laterality: N/A;   RIGHT/LEFT HEART CATH AND CORONARY ANGIOGRAPHY N/A 12/12/2018   Procedure: RIGHT/LEFT HEART CATH AND CORONARY ANGIOGRAPHY;  Surgeon: [name], MD;  Location: [place] [facility];  Service: Cardiovascular;  Laterality: N/A;   THERAPEUTIC ABORTION      Family History  Problem Relation Age of Onset   Cancer Maternal Grandfather        lung   Diabetes Mother    Hypertension Mother    Hypertension Maternal Grandmother  Diabetes Father    Hearing loss Neg Hx     Social History Reviewed with no changes to be made today.   Outpatient Medications Prior to Visit  Medication Sig Dispense Refill   acetaminophen (TYLENOL) 500 MG tablet Take 1,000 mg by mouth every 6 (six) hours as needed for headache (pain).     albuterol (VENTOLIN HFA) 108 (90 Base) MCG/ACT inhaler INHALE 2 PUFFS BY MOUTH EVERY 6 HOURS AS NEEDED FOR WHEEZING FOR SHORTNESS OF BREATH 9 g 0   azaTHIOprine (IMURAN) 50 MG tablet Take 50 mg by mouth 2 (two) times daily.     Blood Glucose Monitoring Suppl (ONETOUCH VERIO) w/Device KIT 1 each by Does not apply route 3 (three) times daily. 1 kit 0   colchicine 0.6 MG tablet Take 1 tablet (0.6 mg total) by mouth daily. 60 tablet 1   diltiazem (CARDIZEM CD) 240 MG 24 hr capsule TAKE 1 CAPSULE BY MOUTH EVERY DAY 90 capsule 2   ELIQUIS 5 MG TABS tablet TAKE 1 TABLET TWICE A DAY 180 tablet 2   furosemide (LASIX) 40 MG tablet Take 1 tablet by mouth 2 (two) times daily.     glucose blood (ONETOUCH VERIO) test strip Use as instructed 100 each 12   hydroxychloroquine  (PLAQUENIL) 200 MG tablet Take 1 tablet (200 mg total) by mouth 2 (two) times daily. 60 tablet 4   Lancet Devices (ONE TOUCH DELICA LANCING DEV) MISC 1 each by Does not apply route 3 (three) times daily. 1 each 1   losartan (COZAAR) 50 MG tablet TAKE 1 TABLET BY MOUTH EVERY DAY 90 tablet 3   medroxyPROGESTERone (DEPO-PROVERA) 150 MG/ML injection Inject 150 mg into the muscle every 3 (three) months.     metoprolol tartrate (LOPRESSOR) 50 MG tablet Take 1 tablet (50 mg total) by mouth 2 (two) times daily. 180 tablet 1   OneTouch Delica Lancets 02R MISC 1 each by Does not apply route 3 (three) times daily. 100 each 12   Phenyleph-CPM-DM-Aspirin (ALKA-SELTZER PLUS COLD & COUGH PO) Take 2 tablets by mouth every 4 (four) hours as needed (cough/congestion).     predniSONE (DELTASONE) 20 MG tablet Take 1 tablet (20 mg total) by mouth daily with breakfast. 30 tablet 0   traMADol (ULTRAM) 50 MG tablet Take 1 tablet by mouth as needed.     Vitamin D, Ergocalciferol, (DRISDOL) 1.25 MG (50000 UNIT) CAPS capsule Take 50,000 Units by mouth once a week.      Colchicine 0.6 MG CAPS TAKE 1 CAPSULE BY MOUTH EVERY DAY 30 capsule 3   Continuous Blood Gluc Receiver (FREESTYLE LIBRE 2 READER) DEVI Use as instructed. Check blood glucose levels five times per day per day. E11.65 1 each 1   Continuous Blood Gluc Sensor (FREESTYLE LIBRE 2 SENSOR) MISC Use as instructed. Check blood glucose levels 5 times per day. E11.65 1 each 6   doxycycline (VIBRAMYCIN) 100 MG capsule Take 1 capsule (100 mg total) by mouth 2 (two) times daily. 20 capsule 0   Dulaglutide (TRULICITY) 4.27 CW/2.3JS SOPN Inject 0.5 mLs (0.75 mg total) into the skin once a week. 6 mL 1   Insulin Pen Needle (TRUEPLUS PEN NEEDLES) 32G X 4 MM MISC Use as directed to inject insulin 5x daily 200 each 3   rosuvastatin (CRESTOR) 20 MG tablet TAKE 1 TABLET BY MOUTH EVERY DAY AT 6PM 90 tablet 1   cetirizine (ZYRTEC) 10 MG tablet Take 1 tablet (10 mg  total) by mouth daily. [place]  tablet 1   OXYGEN 1 L/min to keep O2 sat above 92% with ambulation (Patient not taking: Reported on 04/19/2020)     clopidogrel (PLAVIX) 75 MG tablet TAKE 1 TABLET BY MOUTH EVERY DAY WITH BREAKFAST (Patient not taking: Reported on 04/19/2020) 90 tablet 1   cyclobenzaprine (FLEXERIL) 10 MG tablet Take 1 tablet (10 mg total) by mouth 2 (two) times daily as needed. (Patient not taking: Reported on 04/19/2020) 10 tablet 0   diltiazem (TIAZAC) 240 MG 24 hr capsule Take by mouth. (Patient not taking: Reported on 04/19/2020)     insulin glargine (LANTUS SOLOSTAR) 100 UNIT/ML Solostar Pen Inject 20 Units into the skin daily. (Patient not taking: Reported on 04/19/2020) 15 mL 0   liraglutide (VICTOZA) 18 MG/3ML SOPN Inject into the skin. (Patient not taking: Reported on 04/19/2020)     predniSONE (DELTASONE) 10 MG tablet 1 tablet x 1 week; then 1/2 tablet x 1 week; then stop 14 tablet 0   No facility-administered medications prior to visit.    Allergies  Allergen Reactions   Celery Oil Shortness Of Breath, Itching, Rash and Other (See Comments)    Bumps on tongue, also   Peanut-Containing Drug Products Anaphylaxis, Shortness Of Breath, Itching, Rash and Other (See Comments)    Bumps on tongue, also   Septra [Bactrim] Hives   Sulfamethoxazole-Trimethoprim Hives    Created by Conversion - 0;         Objective:    BP 116/84 (BP Location: Left Arm, Patient Position: Sitting, Cuff Size: Normal)    Pulse 89    Temp 97.7 F (36.5 C) (Temporal)    Wt 225 lb 12.8 oz (102.4 kg)    SpO2 98%    BMI 44.10 kg/m  Wt Readings from Last 3 Encounters:  04/19/20 225 lb 12.8 oz (102.4 kg)  04/05/20 225 lb 12.8 oz (102.4 kg)  03/03/20 224 lb (101.6 kg)    Physical Exam Vitals and nursing note reviewed.  Constitutional:      Appearance: She is well-developed.  HENT:     Head: Normocephalic and atraumatic.  Cardiovascular:     Rate and Rhythm: Normal rate and regular rhythm.      Heart sounds: Normal heart sounds. No murmur heard.  No friction rub. No gallop.   Pulmonary:     Effort: Pulmonary effort is normal. No tachypnea or respiratory distress.     Breath sounds: Normal breath sounds. No decreased breath sounds, wheezing, rhonchi or rales.  Chest:     Chest wall: No tenderness.  Abdominal:     General: Bowel sounds are normal.     Palpations: Abdomen is soft.  Musculoskeletal:        General: Normal range of motion.     Cervical back: Normal range of motion.  Skin:    General: Skin is warm and dry.  Neurological:     Mental Status: She is alert and oriented to person, place, and time.     Coordination: Coordination normal.  Psychiatric:        Behavior: Behavior normal. Behavior is cooperative.        Thought Content: Thought content normal.        Judgment: Judgment normal.          Patient has been counseled extensively about nutrition and exercise as well as the importance of adherence with medications and regular follow-up. The patient was given clear instructions to go to ER or return to medical center if symptoms don't  improve, worsen or new problems develop. The patient verbalized understanding.   Follow-up: Return in about 3 months (around 07/19/2020).   Gildardo Pounds, FNP-BC [facility] and [facility] [place], [place]   04/21/2020, 10:30 PM

## 2020-04-20 LAB — MICROALBUMIN / CREATININE URINE RATIO
Creatinine, Urine: 60.9 mg/dL
Microalb/Creat Ratio: 24 mg/g creat (ref 0–29)
Microalbumin, Urine: 14.4 ug/mL

## 2020-04-20 LAB — CBC
Hematocrit: 43.1 % (ref 34.0–46.6)
Hemoglobin: 13.2 g/dL (ref 11.1–15.9)
MCH: 25.4 pg — ABNORMAL LOW (ref 26.6–33.0)
MCHC: 30.6 g/dL — ABNORMAL LOW (ref 31.5–35.7)
MCV: 83 fL (ref 79–97)
Platelets: 312 10*3/uL (ref 150–450)
RBC: 5.2 x10E6/uL (ref 3.77–5.28)
RDW: 16.5 % — ABNORMAL HIGH (ref 11.7–15.4)
WBC: 12.4 10*3/uL — ABNORMAL HIGH (ref 3.4–10.8)

## 2020-04-20 LAB — CMP14+EGFR
ALT: 17 IU/L (ref 0–32)
AST: 16 IU/L (ref 0–40)
Albumin/Globulin Ratio: 1.2 (ref 1.2–2.2)
Albumin: 4 g/dL (ref 3.8–4.8)
Alkaline Phosphatase: 86 IU/L (ref 48–121)
BUN/Creatinine Ratio: 18 (ref 9–23)
BUN: 19 mg/dL (ref 6–24)
Bilirubin Total: 0.7 mg/dL (ref 0.0–1.2)
CO2: 26 mmol/L (ref 20–29)
Calcium: 9.6 mg/dL (ref 8.7–10.2)
Chloride: 96 mmol/L (ref 96–106)
Creatinine, Ser: 1.07 mg/dL — ABNORMAL HIGH (ref 0.57–1.00)
GFR calc Af Amer: 73 mL/min/{1.73_m2} (ref >59)
GFR calc non Af Amer: 63 mL/min/{1.73_m2} (ref >59)
Globulin, Total: 3.4 g/dL (ref 1.5–4.5)
Glucose: 415 mg/dL — ABNORMAL HIGH (ref 65–99)
Potassium: 3.9 mmol/L (ref 3.5–5.2)
Sodium: 137 mmol/L (ref 134–144)
Total Protein: 7.4 g/dL (ref 6.0–8.5)

## 2020-04-20 LAB — LIPID PANEL
Chol/HDL Ratio: 2.6 ratio (ref 0.0–4.4)
Cholesterol, Total: 187 mg/dL (ref 100–199)
HDL: 72 mg/dL (ref >39)
LDL Chol Calc (NIH): 79 mg/dL (ref 0–99)
Triglycerides: 221 mg/dL — ABNORMAL HIGH (ref 0–149)
VLDL Cholesterol Cal: 36 mg/dL (ref 5–40)

## 2020-04-21 ENCOUNTER — Encounter: Payer: Self-pay | Admitting: Nurse Practitioner

## 2020-04-21 NOTE — Progress Notes (Signed)
Subjective:   [name], female    DOB: [birth year], 44 y.o.   MRN: [id number]   Chief complaint:  Shortness of breath  HPI  45 year old female with long-standing SLE, hypertension, type 2 diabetes mellitus, CAD s/p pLAD PCI for critical stenosis (12/12/2018), paroxysmal Afib w/RVR, moderate aortic stenosis, stable pericardial effusion without tamponade or constriction, interstitial lung disease.  Patient visited her mother in [place] in June 2021. While on this trip, she was hospitalized at [facility] in [place], [place] due to worsening shortness of breath. CT chest findings below. Echocardiogram showed no tamponade from stable pericardial effusion.  Patient has recently had flareup of her interstitial lung disease related to connective tissue disease.  While this improved on steroids, her diabetes is now uncontrolled with A1c of 11.1%.  She is working with her PCP regarding the same.   Current Outpatient Medications on File Prior to Visit  Medication Sig Dispense Refill  . acetaminophen (TYLENOL) 500 MG tablet Take 1,000 mg by mouth every 6 (six) hours as needed for headache (pain).    Marland Kitchen albuterol (VENTOLIN HFA) 108 (90 Base) MCG/ACT inhaler INHALE 2 PUFFS BY MOUTH EVERY 6 HOURS AS NEEDED FOR WHEEZING FOR SHORTNESS OF BREATH 9 g 0  . azaTHIOprine (IMURAN) 50 MG tablet Take 50 mg by mouth 2 (two) times daily.    . Blood Glucose Monitoring Suppl (ONETOUCH VERIO) w/Device KIT 1 each by Does not apply route 3 (three) times daily. 1 kit 0  . cetirizine (ZYRTEC) 10 MG tablet Take 1 tablet (10 mg total) by mouth daily. 90 tablet 1  . colchicine 0.6 MG tablet Take 1 tablet (0.6 mg total) by mouth daily. 60 tablet 1  . Continuous Blood Gluc Receiver (FREESTYLE LIBRE 2 READER) DEVI Use as instructed. Check blood glucose levels five times per day per day. E11.65 Z91.89 1 each 1  . Continuous Blood Gluc Sensor (FREESTYLE LIBRE 2 SENSOR) MISC Use as instructed. Check blood glucose  levels 5 times per day. E11.65 Z91.89 1 each 6  . diltiazem (CARDIZEM CD) 240 MG 24 hr capsule TAKE 1 CAPSULE BY MOUTH EVERY DAY 90 capsule 2  . Dulaglutide (TRULICITY) 9.45 OP/9.2TW SOPN Inject 0.5 mLs (0.75 mg total) into the skin once a week. 6 mL 1  . ELIQUIS 5 MG TABS tablet TAKE 1 TABLET TWICE A DAY 180 tablet 2  . furosemide (LASIX) 40 MG tablet Take 1 tablet by mouth 2 (two) times daily.    Marland Kitchen glucose blood (ONETOUCH VERIO) test strip Use as instructed 100 each 12  . hydroxychloroquine (PLAQUENIL) 200 MG tablet Take 1 tablet (200 mg total) by mouth 2 (two) times daily. 60 tablet 4  . Insulin Glargine (BASAGLAR KWIKPEN) 100 UNIT/ML Inject 0.2 mLs (20 Units total) into the skin daily. E11.65. may increase by 2 units every 3 days for fasting blood glucose readings greater than 130. 6 mL 6  . insulin lispro (HUMALOG KWIKPEN) 100 UNIT/ML KwikPen For blood sugars 0-150 give 0 units of insulin, 151-200 give 2 units of insulin, 201-250 give 4 units, 251-300 give 6 units, 301-350 give 8 units, 351-400 give 10 units,> 400 give 12 units and call office. Discussed hypoglycemia protocol. 15 mL 11  . Insulin Pen Needle (TRUEPLUS PEN NEEDLES) 32G X 4 MM MISC Use as directed to inject insulin 5x daily 200 each 3  . Lancet Devices (ONE TOUCH DELICA LANCING DEV) MISC 1 each by Does not apply route 3 (three) times daily. 1  each 1  . losartan (COZAAR) 50 MG tablet TAKE 1 TABLET BY MOUTH EVERY DAY 90 tablet 3  . medroxyPROGESTERone (DEPO-PROVERA) 150 MG/ML injection Inject 150 mg into the muscle every 3 (three) months.    . metoprolol tartrate (LOPRESSOR) 50 MG tablet Take 1 tablet (50 mg total) by mouth 2 (two) times daily. 180 tablet 1  . OneTouch Delica Lancets 83E MISC 1 each by Does not apply route 3 (three) times daily. 100 each 12  . OXYGEN 1 L/min to keep O2 sat above 92% with ambulation (Patient not taking: Reported on 04/19/2020)    . Phenyleph-CPM-DM-Aspirin (ALKA-SELTZER PLUS COLD & COUGH PO) Take 2  tablets by mouth every 4 (four) hours as needed (cough/congestion).    . predniSONE (DELTASONE) 20 MG tablet Take 1 tablet (20 mg total) by mouth daily with breakfast. 30 tablet 0  . rosuvastatin (CRESTOR) 20 MG tablet TAKE 1 TABLET BY MOUTH EVERY DAY AT 6PM 90 tablet 1  . traMADol (ULTRAM) 50 MG tablet Take 1 tablet by mouth as needed.    . Vitamin D, Ergocalciferol, (DRISDOL) 1.25 MG (50000 UNIT) CAPS capsule Take 50,000 Units by mouth once a week.     . [DISCONTINUED] insulin aspart (NOVOLOG FLEXPEN) 100 UNIT/ML FlexPen For blood sugars 0-150 give 0 units of insulin, 151-200 give 2 units of insulin, 201-250 give 4 units, 251-300 give 6 units, 301-350 give 8 units, 351-400 give 10 units,> 400 give 12 units and call M.D. Discussed hypoglycemia protocol. 15 mL 3   No current facility-administered medications on file prior to visit.    Cardiovascular studies:  EKG 04/22/2020: Sinus tachycardia 100 bpm Right axis deviation First degree AV block Occasional PAC/PVC Low voltage   Echocardiogram 01/12/2020 (External): Concentric LVH with abnormal diastolic function and left atrial  enlargement.  Normal LVEF without significant wall motion abnormalities. Aortic valve  sclerosis with estimated moderate stenosis  Right heart enlargement/dysfunction with estimated moderately elevated  pulmonary artery systolic pressure  Small circumferential pericardial effusion without frank tamponade  Mildly dilated ascending aorta.   CT chest 01/12/2020: Sub-optimal examination for segmental and subsegmental pulmonary embolism due to respiratory motion artifact.  No evidence of pulmonary embolism.  Extensive bilateral patchy and groundglass lung opacification, likely multifocal infection/inflammationwith underlying mild edema. Follow-up chest imaging should be performed as clinically warranted.  Moderate to large pericardial effusion.  Cardiomegaly. Mild pulmonary artery enlargement. Reflux of IV contrast  into the IVC and hepatic veins, suggestive of elevated right heart pressures.  Aortic valve calcification. Moderate left-sided coronary artery calcifications. These findings are advanced for patient's age. Clinical correlation is recommended.  Multiple borderline sized to mildly enlarged mediastinal and hilar lymph nodes, suspected to be reactive.    EKG 12/12/2018: Sinus rhythm, aberrant conducted PAC's  R/LHC, coronary angiography and intervention 12/12/2018: RA: 10 mmHg RV: 46/7 mmHg. RVEDP 8 mmHg PA: 48/22 mmHg. Mean PA 33 mmHg PW: 17 mmHg LV 155/7 mmHg. LVEDP 10 mmHg <10 mmHg LV-Ao pullback gradient CO 7.8 L/min. CI 4 L/min/m2  Simultaneous RV-LV pressure tracings do not show interdependence to suggest constriction physiology.  LM: Wall calcium with no significant stenosis LAD: Ostial-proximal 90% stenosis. Normal diagonal branches. Successful PTCA and stent placement pLAD Synergy DES 3.5X16 mm DES Post dilatation with 4.0 X 8 mm [place] balloon. 0% residual stenosis Ramus: Normal LCx: Normal RCA: Normal   Recent labs: 04/19/2020: Glucose 415, BUN/Cr 19/1.07. EGFR 73. Na/K 137/3.9. Rest of the CMP normal H/H 13/43. MCV 83. Platelets 312 HbA1C 11.1%  Chol 187, TG 221, HDL 72, LDL 79  09/07/2019: Glucose 99, BUN/Cr 10/0.76. EGFR 110. Na/K 142/3.3. Rest of the CMP normal H/H 11/34. MCV 79. Platelets 337 HbA1C 6.8%  Results for KOLLEEN, [facility] (MRN [id number]) as of 10/23/2019 05:45  Ref. Range 04/13/2019 14:25 04/13/2019 16:07 07/06/2019 16:55 07/06/2019 19:46  Troponin I (High Sensitivity) Latest Ref Range: <18 ng/L 42 (H) 44 (H) 36 (H) 40 (H)    Review of Systems  Cardiovascular: Negative for chest pain, dyspnea on exertion, leg swelling, palpitations and syncope.        Vitals:   04/22/20 0847 04/22/20 0849  BP: (!) 152/95 140/88  Pulse: 70 65  Resp: 16   SpO2: 98%     Physical Exam Vitals and nursing note reviewed.  Constitutional:      General: She is not  in acute distress. Neck:     Vascular: No JVD.  Cardiovascular:     Rate and Rhythm: Regular rhythm. Tachycardia present.     Pulses: Intact distal pulses.     Heart sounds: Normal heart sounds. No murmur heard.   Pulmonary:     Effort: Pulmonary effort is normal.     Breath sounds: Normal breath sounds. No wheezing or rales.  Abdominal:     General: Bowel sounds are normal.     Palpations: Abdomen is soft.     Tenderness: There is no rebound.  Lymphadenopathy:     Cervical: No cervical adenopathy.  Neurological:     Cranial Nerves: No cranial nerve deficit.           Assessment & Recommendations:   44 year old female with long-standing SLE, hypertension, type 2 diabetes mellitus, CAD s/p pLAD PCI for critical stenosis (12/12/2018), paroxysmal Afib w/RVR, moderate aortic stenosis, stable pericardial effusion without tamponade or constriction (Cath 12/2018), interstitial lung disease, here for transition of care follow up.  Shortness of breath: Improved with treatment of pulmonary fibrosis.   CAD: S/p pLAD PCI. Recommend Aspirin 81 mg No angina symptoms. Continue Crestor 20 mg.   Paroxysmal Afib: Continue metoprolol tartarate 50 mg bid, diltiazem to 240 mg daily.  CHA2DS2VASc score 3. On eliquis 5 mg bid  Hypertension:  No change made today. Continue remote patient monitoring.   Pericardial effusion: Historically stable without tamponade or constriction. Likely related to lupus. Benefits of pericardiocentesis do not outweigh the risks. Continue lupus management as per rheumatology. Conitnue colchicine 0.6 mg daily.   Moderate aortic stenosis: Aortic stenosis of trileaflet valve.Continue serial monitoring with echocardiogram. Repeat echocardiogram in 10/2020.  Type 2 DM: Continue current management per PCP, along with Jardiance.  Lupus: She has not been able to see them recently. Lupus management remains crucial.  Echocardiogram and f/u in 6 months   Esther Hardy, MD [facility] [place] Pager: [phone] Office: [phone] If no answer Cell [phone]

## 2020-04-22 ENCOUNTER — Other Ambulatory Visit: Payer: Self-pay

## 2020-04-22 ENCOUNTER — Encounter: Payer: Self-pay | Admitting: Cardiology

## 2020-04-22 ENCOUNTER — Ambulatory Visit: Payer: 59 | Admitting: Cardiology

## 2020-04-22 VITALS — BP 140/88 | HR 65 | Resp 16 | Ht 60.0 in | Wt 226.0 lb

## 2020-04-22 DIAGNOSIS — I3139 Other pericardial effusion (noninflammatory): Secondary | ICD-10-CM

## 2020-04-22 DIAGNOSIS — I480 Paroxysmal atrial fibrillation: Secondary | ICD-10-CM

## 2020-04-22 DIAGNOSIS — I350 Nonrheumatic aortic (valve) stenosis: Secondary | ICD-10-CM

## 2020-04-22 DIAGNOSIS — J849 Interstitial pulmonary disease, unspecified: Secondary | ICD-10-CM

## 2020-04-22 DIAGNOSIS — I313 Pericardial effusion (noninflammatory): Secondary | ICD-10-CM

## 2020-04-22 DIAGNOSIS — I2510 Atherosclerotic heart disease of native coronary artery without angina pectoris: Secondary | ICD-10-CM

## 2020-04-22 MED ORDER — ASPIRIN EC 81 MG PO TBEC: 81.0000 mg | DELAYED_RELEASE_TABLET | Freq: Every day | ORAL | 3 refills | Status: AC

## 2020-04-25 ENCOUNTER — Telehealth: Payer: Self-pay

## 2020-04-25 ENCOUNTER — Ambulatory Visit (INDEPENDENT_AMBULATORY_CARE_PROVIDER_SITE_OTHER): Payer: 59 | Admitting: *Deleted

## 2020-04-25 ENCOUNTER — Other Ambulatory Visit: Payer: Self-pay

## 2020-04-25 ENCOUNTER — Encounter: Payer: Self-pay | Admitting: *Deleted

## 2020-04-25 ENCOUNTER — Other Ambulatory Visit (HOSPITAL_COMMUNITY)
Admission: RE | Admit: 2020-04-25 | Discharge: 2020-04-25 | Disposition: A | Discharge status: Home / Self Care | Payer: Medicaid Other | Source: Ambulatory Visit | Attending: Obstetrics and Gynecology | Admitting: Obstetrics and Gynecology

## 2020-04-25 VITALS — Ht 60.0 in | Wt 227.8 lb

## 2020-04-25 DIAGNOSIS — N898 Other specified noninflammatory disorders of vagina: Secondary | ICD-10-CM

## 2020-04-25 NOTE — Telephone Encounter (Signed)
Pt left VM on nurse line requesting a call back to discuss vaginal itching following antibiotic treatment. Called pt. Pt states she has milky/clear discharge with an odor. Also experiencing vaginal itching/irritation. Pt has experienced both BV and yeast at other times and is unsure if these symptoms feel the same. Pt agreeable to nurse visit for self-swab this afternoon prior to treatment for vaginal symptoms.

## 2020-04-25 NOTE — Progress Notes (Signed)
Pt states she was taking Penicillin recently due to having pneumonia. Since finishing the prescription one week ago she has observed vaginal itching, irritation and odor. She has had vaginal yeast and BV in the past and feels these sx are similar. Self swab instructions were given and specimen was obtained by pt. She was advised that she will be notified of results and treatment indicated if any via MyChart. Pt was also advised that she may use hydrocortisone ointment to external genitalia to help with itching and irritation. Pt voiced understanding of all information and instructions given.

## 2020-04-26 NOTE — Progress Notes (Signed)
Chart reviewed for nurse visit. Agree with plan of care.   [place]arr [place], [place] 04/26/2020 6:31 PM

## 2020-04-27 ENCOUNTER — Other Ambulatory Visit: Payer: Self-pay | Admitting: Pharmacist

## 2020-04-27 DIAGNOSIS — E1165 Type 2 diabetes mellitus with hyperglycemia: Secondary | ICD-10-CM

## 2020-04-27 DIAGNOSIS — E785 Hyperlipidemia, unspecified: Secondary | ICD-10-CM

## 2020-04-27 LAB — CERVICOVAGINAL ANCILLARY ONLY
Bacterial Vaginitis (gardnerella): NEGATIVE
Candida Glabrata: NEGATIVE
Candida Vaginitis: POSITIVE — AB
Chlamydia: NEGATIVE
Comment: NEGATIVE
Comment: NEGATIVE
Comment: NEGATIVE
Comment: NEGATIVE
Comment: NEGATIVE
Comment: NORMAL
Neisseria Gonorrhea: NEGATIVE
Trichomonas: NEGATIVE

## 2020-04-27 MED ORDER — ACCU-CHEK GUIDE VI STRP: ORAL_STRIP | 6 refills | Status: AC

## 2020-04-28 ENCOUNTER — Encounter: Payer: Self-pay | Admitting: Internal Medicine

## 2020-04-28 ENCOUNTER — Ambulatory Visit (INDEPENDENT_AMBULATORY_CARE_PROVIDER_SITE_OTHER): Payer: 59 | Admitting: Internal Medicine

## 2020-04-28 ENCOUNTER — Other Ambulatory Visit: Payer: Self-pay

## 2020-04-28 ENCOUNTER — Telehealth: Payer: Self-pay | Admitting: Pharmacy Technician

## 2020-04-28 VITALS — BP 118/78 | HR 47 | Temp 97.2°F | Ht 60.0 in | Wt 231.0 lb

## 2020-04-28 DIAGNOSIS — J849 Interstitial pulmonary disease, unspecified: Secondary | ICD-10-CM | POA: Diagnosis not present

## 2020-04-28 DIAGNOSIS — Z5181 Encounter for therapeutic drug level monitoring: Secondary | ICD-10-CM | POA: Diagnosis not present

## 2020-04-28 DIAGNOSIS — Z7185 Encounter for immunization safety counseling: Secondary | ICD-10-CM

## 2020-04-28 DIAGNOSIS — J8489 Other specified interstitial pulmonary diseases: Secondary | ICD-10-CM | POA: Diagnosis not present

## 2020-04-28 DIAGNOSIS — Z7189 Other specified counseling: Secondary | ICD-10-CM

## 2020-04-28 DIAGNOSIS — R0683 Snoring: Secondary | ICD-10-CM | POA: Diagnosis not present

## 2020-04-28 DIAGNOSIS — M359 Systemic involvement of connective tissue, unspecified: Secondary | ICD-10-CM

## 2020-04-28 NOTE — Telephone Encounter (Signed)
Received notification from [facility] regarding a prior authorization for ESBRIET. Authorization has been APPROVED from 04/28/20 to 04/28/21.   Authorization # [id number]  Ran test claim, patient's copay for 1 month is $5,096.00. Patient had insurance change and has an unmet pharmacy deductible of $2,900. Patient enrolled for Esbriet copay card in past and is still eligible.

## 2020-04-28 NOTE — Progress Notes (Signed)
Subjective:    Patient ID: [name], female    DOB: 1976/04/28, 44 y.o.   MRN: [id number]  HPI   OV 06/11/2019  Subjective:  Patient ID: [name], female , DOB: June 29, 1976 , age 43 y.o. , MRN: [id number] , ADDRESS: [address] [place] [address]   06/11/2019 -   Chief Complaint  Patient presents with  . Follow-up    Patient reports that she's doing much better and her cough is alot better than it was.      HPI [name] 44 y.o. -is being evaluated to the interstitial lung disease clinic.  She is lupus related interstitial lung disease.  Details are all listed below.  She is unaware of the presence of interstitial lung disease.  I personally visualized the scan.  Documented to have ILD in 2011 worse in February 2020 the traction bronchiectasis is worse now in October 2020 later scan.  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  Severity is listed below.  Is associated with arthralgia.  Also since May 2019 she has chronic cough that is very severe.  It gets better with prednisone.  Sometimes clear sputum but sometimes dry.  It is worse when she lies down.  It affects her voice she clears her throat and she does feel a tickle in the back of the throat.  Other symptoms include arthralgia muscle pain rash from lupus and depression. Dyspnea is progressive this year.   Past Medical History :  -   #Lupus -according to her lupus was diagnosed in 1999.  This was at the time of childbirth.  Since then through May 2019 symptoms were mainly malar rash and other skin lesions and arthralgia.  She was on chronic long-term Plaquenil.  She would only be on occasional prednisone.   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  This includes pericardial effusion,  coronary artery disease,  atrial fibrillation.  She says since then she is required prednisone which she took constantly for 3 months through August 2019.  After that she has had intermittent course of prednisone ranging from several days to few months.  In total 7 times.  She has had admissions to the hospital because of this and is listing for heart failure, coronary artery disease and also ILD flareup.  She had a right heart cath and left heart cath.  This was done in May 2020 by Dr. Virgina Jock.  She is status post coronary stent and is on antiplatelet therapy with Plavix and Eliquis.  Her wedge pressures were slightly high and so was a pulmonary artery pressures.  Her longstanding pericardial effusion is being monitored clinically.  There is no evidence of tamponade as of May 2020 right heart catheterization.  She states that she is been on and off prednisone.  In spring 2020 she was started on Imuran by Dr. Melissa Noon team.  Most recently in September 2020 [late September] she tried to wean herself off prednisone but then her shortness of breath cough wheezing and skin lesions and arthralgia all get worse and  so she is back on prednisone.  This most recent prednisone was initiated by our office nurse practitioner.  She feels Imuran is not controlling her lupus as well as the prednisone    #History positive for CHF not otherwise specified coronary artery disease and pericardial effusion and atrial fibrillation  #  she says she has sleep apnea but is not on CPAP.  Details not known.  She has diabetes.  She has DJD  ROS: Positive for fatigue arthralgia discoloration of her fingers in cold weather [Raynaud's] malar rash and other rash snoring heartburn but no oral ulcers.  No nausea vomiting or  diarrhea.   FAMILY HISTORY of LUNG DISEASE: She has a cousin with asthma and grandmother with sarcoidosis.  Another cousin with an autoimmune disease.   EXPOSURE HISTORY: She does not smoke cigarettes.  No electronic cigarette use.  No marijuana use no cocaine use no IV drug abuse.   HOME and HOBBY DETAILS : [place] the last 2 years.  Age of the home is unknown.  In the home there is no history of any organic antigen exposure.  For example she does not live in a damp environment.  There is no mildew in the bathrooms.  There is no humidifier.  She does not use a CPAP.  No nebulizer machine use.  No steam Jacuzzi.  No steam iron.  There is no fountain in the house.  No pet birds no hamsters of pet gerbils.  Does not use feather pillow or duvet.  There is no mold in the [facility] duct.  Does not play musical instruments.  Does not do any gardening.   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  She is also been on colchicine she says.  She is been on prednisone on and off since 2019.  Also Dilantin according to history.  IMPRESSION: HRCT 1. Spectrum of findings compatible with basilar predominant fibrotic interstitial lung disease without frank honeycombing. Slight worsening of traction bronchiectasis since 09/24/2018 high-resolution chest CT. 2. Stable mild cardiomegaly and moderate pericardial effusion. Findings are categorized as probable UIP per consensus guidelines: Diagnosis of Idiopathic Pulmonary Fibrosis: An Official ATS/ERS/JRS/ALAT Clinical Practice Guideline. [place], Iss 5, [phone], Apr 13 2017. 3. Three-vessel coronary atherosclerosis. 4. Stable mild mediastinal lymphadenopathy, compatible with benign reactive etiology.  Aortic Atherosclerosis  (ICD10-I70.0).   Electronically Signed   By: Ilona Sorrel M.D.   On: 06/05/2019 16:47  ROS - per HPI    HEART CATH MAy [id number]  LV end diastolic pressure is normal.   RA: 10 mmHg RV: 46/7 mmHg. RVEDP 8 mmHg PA: 48/22 mmHg. Mean PA 33 mmHg PW: 17 mmHg LV 155/7 mmHg. LVEDP 10 mmHg <10 mmHg LV-Ao pullback gradient CO 7.8 L/min. CI 4 L/min/m2  Simultaneous RV-LV pressure tracings do not show interdependence to suggest constriction physiology.  LM: Wall calcium with no significant stenosis LAD: Ostial-proximal 90% stenosis. Normal diagonal branches. Successful PTCA and stent placement pLAD Synergy DES 3.5X16 mm DES Post dilatation with 4.0 X 8 mm [place] balloon. 0% residual stenosis Ramus: Normal LCx: Normal RCA: Normal  Recommendation: Aspirin/plavix/Eliquis for 1 month. Then stop Aspirin, continue eliquis and plavix for 6 months.  Patients medical complicities remain complex. She still needs aggressive management for her lupus and interstitial lung  disease. Should she need lung biopsy in the near future, plavix could potentially be stopped after 3 months use ie in 03/2019.  [name], MD [facility] [place] Pager: [phone] Office: [phone] If no answer Cell [phone]   has a past medical history of Abnormal Pap smear of cervix, Allergy, Anemia, Atrial fibrillation ([place]), CHF (congestive heart failure) ([place]), Depression, Enlarged heart, Fibroid, Gestational diabetes, Heart murmur, Hypertension, Infection, and Lupus ([place]) (1999).   reports that she has never smoked. She has never used smokeless tobacco.  OV 08/19/2019  Subjective:  Patient ID: [name], female , DOB: 1976-07-19 , age 57 y.o. , MRN: [id number] , ADDRESS: [address] [place] [address]   08/19/2019 -   Chief Complaint  Patient presents with  . Follow-up    Pt states she has been doing better since last visit. Pt does have an occ cough which has gotten  better. Pt does become SOB with activities.   Follow-up progressive interstitial lung disease in the setting of connective tissue disease; systemic lupus erythematosus -started pirfenidone July 09, 2019 Has associated  -Morbid obesity with history of snoring: High pretest probability for sleep apnea -Significant systemic lupus erythematosus: Follows with Dr. Amil Amen but having insurance issues -Pericardial effusion and pulmonary hypertension seen on heart catheterization in spring 2020: Follows with Dr. Virgina Jock on expectant follow-up.  HPI [name] 44 y.o. -this follow-up visit is to ensure that she is tolerating her pirfenidone quite well.  She started this on July 09 2019 for progressive ILD.  She tells me that at this point in time she is completely off prednisone.  Without the prednisone she is actually feeling better.  Her sugars are better controlled.  She does not have that much fatigue.  She tells me the pirfenidone is overall helping her [this medicines are supposed to make her feel better but is more preventative antifibrotic].  She feels that even her fatigue and dyspnea are better because of the pirfenidone [I do not know about any anti-inflammatory effects against autoimmune disease with this drug but it can function as an anti-inflammatory].  Her dyspnea is better.  She did have some GI side effects early on with the drug but currently none.  She is having liver function test today.  Today she enrolled in the ILD-pro registry study for progressive non-IPF ILD.  She wants to see a different rheumatologist because of insurance issues she is having difficulty establishing again with Dr. Amil Amen.  She has not yet seen a sleep doctor for his suspected sleep apnea.  She has seen cardiology Dr. Virgina Jock for pericardial effusion and pulmonary hypertension.  She is on expectant follow-up.  I reviewed his notes.  It appears she also had a ER visit because of multifocal atrial  tachycardia symptom score and walking desaturation test are documented below.    OV 09/16/2019  Subjective:  Patient ID: [name], female , DOB: 1976-08-08 , age 58 y.o. , MRN: [id number] , ADDRESS: [address] [place] [address] [place] [address]  Follow-up progressive interstitial lung disease in the setting of connective tissue disease; SLE   -started pirfenidone July 09, 2019  - enrolled ILD-Pro registry study Jan 2021 Has associated  -Morbid obesity with history of snoring: High pretest probability for sleep apnea  -Significant systemic lupus erythematosus:   - Follows with Dr. Amil Amen but having insurance issues  - Referred to Dr Estanislado Pandy - [place] Sella 2021.   - On Pllaquenil since 2000  -  On immuran since early 2020  - s.p prednisone 2018 - 2020 intermittnet only - significant side effects +  -Pericardial effusion and pulmonary hypertension seen on heart catheterization in spring 2020:   - Follows with Dr. Virgina Jock on expectant follow-up.  09/16/2019 -telephone visit Chief Complaint  Patient presents with  . televisit    Called and spoke with pt who states she has been doing okay since last visit. Pt states she has had some issues with nausea.     HPI [name] 44 y.o. -on this telephone visit patient identified with 2 person identifier.  Risks, benefits and limitations of telephone visit explained.  She verbalized and agreed to proceed.  This visit main focus is to see how she is doing with the pirfenidone.  She tells me for the last few weeks she has had nausea in the afternoon.  I found out that she is taking her morning dose between 9 and 10 AM and the afternoon dose between noon and 1 PM.  This is very inadequate spacing.  She did not realize she has a space the dosing at least 5 or 6 hours apart.  She was reeducated on this.  Followed up with her on her sleep issues.  She does not have a follow-up sleep consult appointment.  I also referred her to  Dr D  rheumatologist.  However she is here to establish with her.  She is having insurance issues with Dr. Amil Amen.  She has upcoming appointment with Dr. Virgina Jock who cardiologist.  In terms of her lupus she is not on prednisone but continuing Plaquenil and Imuran.  She is really keen to reestablish with a rheumatologist.     OV 10/21/2019  Chief Complaint  Patient presents with  . Televisit    Called and spoke with pt who states she has been doing okay since last visit. States she did see rheumatology yesterday 3/9. Pt states her breathing is now at a stable point as it is doing better since last visit.   Follow-up progressive interstitial lung disease in the setting of connective tissue disease; SLE   -started pirfenidone July 09, 2019  - enrolled ILD-Pro registry study Jan 2021  Has associated  -Morbid obesity with history of snoring: High pretest probability for sleep apnea  -Significant systemic lupus erythematosus:   - Follows with Dr. Amil Amen but having insurance issues  - Referred to Dr Estanislado Pandy - [place] Sella 2021. - not able to see  - On Pllaquenil since 2000  - On immuran since early 2020  - s.p prednisone 2018 - 2020 intermittnet only - significant side effects +  -Pericardial effusion and pulmonary hypertension seen on heart catheterization in spring 2020:   - Follows with Dr. Virgina Jock on expectant follow-up.     OV 10/21/2019 telephone visit.  Patient identified with 2 person identifier.  Chief Complaint  Patient presents with  . Televisit    Called and spoke with pt who states she has been doing okay since last visit. States she did see rheumatology yesterday 3/9. Pt states her breathing is now at a stable point as it is doing better since last visit.    S:  ILD: Main visit is for esbriet tolerance -after spacing out pirfenidone she has no more nausea she is having some mild diarrhea.  Otherwise feeling good.  Dyspnea is much improved as documented below  Pulmonary  hypertension and pericardial effusion: She has upcoming appointment for echocardiogram and follow-up with Dr. Virgina Jock  Lupus:  She has now established with Dr. Kathlene November.  She is on Imuran and Plaquenil.  She will not do prednisone because of the side effects.     OV 04/28/2020   Subjective:  Patient ID: [name], female , DOB: 07-02-1976, age 72 y.o. years. , MRN: [id number],  ADDRESS: [address] [place] [address] PCP  Gildardo Pounds, NP Providers : Treatment Team:  Attending Provider: Brand Males, MD   Follow-up progressive interstitial lung disease in the setting of connective tissue disease; SLE   -started pirfenidone July 09, 2019  - enrolled ILD-Pro registry study Jan 2021 Has associated  -Morbid obesity with history of snoring: High pretest probability for sleep apnea  -Significant systemic lupus erythematosus:   - Follows with Dr. Amil Amen but having insurance issues  - Referred to Dr Estanislado Pandy - Mary Sella 2021.   - Established with Dr Kathlene November - s[prig 2021  - On Pllaquenil since 2000  - On immuran since early 2020  - s.p prednisone 2018 - 2020 intermittnet only - significant side effects +  -Pericardial effusion and pulmonary hypertension seen on heart catheterization in spring 2020:   - Follows with Dr. Virgina Jock on expectant follow-up.    Chief Complaint  Patient presents with  . Follow-up    pt  is here for ild follow up       HPI [name] 44 y.o. -presents for follow-up of her multiple issues particularly ILD.  She has been off prednisone since October 2020.  She was continuing pirfenidone I last had a telephone visit with her in spring 2021.  After that she visited her mom in [place] state in June 2021.  She ended up at [facility] with "fluid in her chest".  At the discharge she was given prednisone taper to off.  She then saw rheumatologist in July 2021 in [place].  Was given another prednisone taper  to off.  However she ended up seeing my nurse practitioner in August 2021 with another respiratory flare and she is on a third prednisone taper in 3 months.  This taper ends in a few days.  She says every time she takes prednisone she starts feeling better.  Overall she feels her health is much more stable than June 2021 when she was hospitalized.  She also feels is stable similar to last year.  However ILD symptom questionnaire shows significant worsening of her symptoms compared to even earlier this year.  Pulmonary function test done recently shows decline.  Her last CT scan of the chest was in October 2020.  She is asking if she should be seen by [facility] rheumatology for an opinion.  I have left it to her choice.  She is known to have high risk for sleep apnea.  She missed seeing our sleep doctors here.  She continues on Imuran and Plaquenil     SYMPTOM SCALE - ILD 10/21/2019  04/28/2020   O2 use On room air 24/7 ra  Shortness of Breath 0 -> 5 scale with 5 being worst (score 6 If unable to do)   At rest 0 0  Simple tasks - showers, clothes change, eating, shaving 1 2  Household (dishes, doing bed, laundry) 1 4  Shopping 1 2  Walking level at own pace 2 2  Walking up Stairs 3 4  Total (30-36) Dyspnea Score 8 14  How bad is your cough? 0 3  How bad is your fatigue 1 0  How bad  is nausea 0 0  How bad is vomiting?  0 0  How bad is diarrhea? 2 - she thinks is colchicine but onset after esbriet 0  How bad is anxiety? 0 0  How bad is depression 0 0        Simple office walk 185 feet x  3 laps goal with forehead probe 06/11/2019  08/19/2019 esbeit 04/28/2020 Off esbriet since June 2021  O2 used RA RA   Number laps completed 3 3   Comments about pace Nl pace Slow pace due to arthralgia   Resting Pulse Ox/HR 98% and 104/min 100% and 102/min 99% and 66/min  Final Pulse Ox/HR 96% and 127/min 98% and 118/min 90% and 92/mn  Desaturated </= 88% no no   Desaturated <= 3% points no no   Got  Tachycardic >/= 90/min yes yes   Symptoms at end of test Very mild dyspnea Mild dyspnea dyspneic  Miscellaneous comments *feeling better      PFT Results Latest Ref Rng & Units 03/21/2020 05/12/2019  FVC-Pre L 1.47 1.65  FVC-Predicted Pre % 57 63  FVC-Post L - 1.69  FVC-Predicted Post % - 65  Pre FEV1/FVC % % 92 90  Post FEV1/FCV % % - 91  FEV1-Pre L 1.36 1.48  FEV1-Predicted Pre % 64 69  FEV1-Post L - 1.54  DLCO uncorrected ml/min/mmHg 8.68 10.98  DLCO UNC% % 46 58  DLCO corrected ml/min/mmHg 8.68 -  DLCO COR %Predicted % 46 -  DLVA Predicted % 80 93  TLC L - 2.72  TLC % Predicted % - 61  RV % Predicted % - 72    IMPRESSION: 1. Spectrum of findings compatible with basilar predominant fibrotic interstitial lung disease without frank honeycombing. Slight worsening of traction bronchiectasis since 09/24/2018 high-resolution chest CT. 2. Stable mild cardiomegaly and moderate pericardial effusion. Findings are categorized as probable UIP per consensus guidelines: Diagnosis of Idiopathic Pulmonary Fibrosis: An Official ATS/ERS/JRS/ALAT Clinical Practice Guideline. [place], Iss 5, [phone], Apr 13 2017. 3. Three-vessel coronary atherosclerosis. 4. Stable mild mediastinal lymphadenopathy, compatible with benign reactive etiology.  Aortic Atherosclerosis (ICD10-I70.0).   Electronically Signed   By: Ilona Sorrel M.D.   On: 06/05/2019 16:47   ROS - per HPI     has a past medical history of Abnormal Pap smear of cervix, Allergy, Anemia, Atrial fibrillation ([place]), CHF (congestive heart failure) ([place]), Depression, Enlarged heart, Fibroid, Gestational diabetes, Heart murmur, Hypertension, Infection, Lupus ([place]) (1999), and Pericarditis.   reports that she has never smoked. She has never used smokeless tobacco.  Past Surgical History:  Procedure Laterality Date  . A-FLUTTER ABLATION N/A 06/10/2018   Procedure: A-FLUTTER ABLATION;  Surgeon: Evans Lance, MD;  Location: [facility];  Service: Cardiovascular;  Laterality: N/A;  . CARDIAC CATHETERIZATION    . CORONARY STENT INTERVENTION N/A 12/12/2018   Procedure: CORONARY STENT INTERVENTION;  Surgeon: [name], MD;  Location: [facility];  Service: Cardiovascular;  Laterality: N/A;  lad   . RIGHT/LEFT HEART CATH AND CORONARY ANGIOGRAPHY N/A 03/14/2018   Procedure: RIGHT/LEFT HEART CATH AND CORONARY ANGIOGRAPHY;  Surgeon: [name], MD;  Location: [facility];  Service: Cardiovascular;  Laterality: N/A;  . RIGHT/LEFT HEART CATH AND CORONARY ANGIOGRAPHY N/A 12/12/2018   Procedure: RIGHT/LEFT HEART CATH AND CORONARY ANGIOGRAPHY;  Surgeon: [name], MD;  Location: [facility];  Service: Cardiovascular;  Laterality: N/A;  . THERAPEUTIC  ABORTION      Allergies  Allergen Reactions  . Celery Oil Shortness Of Breath, Itching, Rash and Other (See Comments)    Bumps on tongue, also  . Peanut-Containing Drug Products Anaphylaxis, Shortness Of Breath, Itching, Rash and Other (See Comments)    Bumps on tongue, also  . Septra [Bactrim] Hives  . Sulfamethoxazole-Trimethoprim Hives    Created by Conversion - 0;      Immunization History  Administered Date(s) Administered  . Influenza,inj,Quad PF,6+ Mos 05/25/2013, 05/14/2018, 05/13/2019  . PFIZER SARS-COV-2 Vaccination 11/20/2019, 12/15/2019  . Pneumococcal Polysaccharide-23 05/14/2018  . Tdap 07/09/2018    Family History  Problem Relation Age of Onset  . Cancer Maternal Grandfather        lung  . Diabetes Mother   . Hypertension Mother   . Hypertension Maternal Grandmother   . Diabetes Father   . Hearing loss Neg Hx      Current Outpatient Medications:  .  acetaminophen (TYLENOL) 500 MG tablet, Take 1,000 mg by mouth every 6 (six) hours as needed for headache (pain)., Disp: , Rfl:  .  albuterol (VENTOLIN HFA) 108 (90 Base) MCG/ACT inhaler, INHALE 2 PUFFS BY MOUTH EVERY 6  HOURS AS NEEDED FOR WHEEZING FOR SHORTNESS OF BREATH, Disp: 9 g, Rfl: 0 .  aspirin EC 81 MG tablet, Take 1 tablet (81 mg total) by mouth daily. Swallow whole., Disp: 90 tablet, Rfl: 3 .  azaTHIOprine (IMURAN) 50 MG tablet, Take 50 mg by mouth 2 (two) times daily., Disp: , Rfl:  .  Blood Glucose Monitoring Suppl (ONETOUCH VERIO) w/Device KIT, 1 each by Does not apply route 3 (three) times daily., Disp: 1 kit, Rfl: 0 .  colchicine 0.6 MG tablet, Take 1 tablet (0.6 mg total) by mouth daily., Disp: 60 tablet, Rfl: 1 .  Continuous Blood Gluc Receiver (FREESTYLE LIBRE 2 READER) DEVI, Use as instructed. Check blood glucose levels five times per day per day. E11.65 Z91.89, Disp: 1 each, Rfl: 1 .  Continuous Blood Gluc Sensor (FREESTYLE LIBRE 2 SENSOR) MISC, Use as instructed. Check blood glucose levels 5 times per day. E11.65 Z91.89, Disp: 1 each, Rfl: 6 .  diltiazem (CARDIZEM CD) 240 MG 24 hr capsule, TAKE 1 CAPSULE BY MOUTH EVERY DAY, Disp: 90 capsule, Rfl: 2 .  Dulaglutide (TRULICITY) 0.62 IR/4.8NI SOPN, Inject 0.5 mLs (0.75 mg total) into the skin once a week., Disp: 6 mL, Rfl: 1 .  ELIQUIS 5 MG TABS tablet, TAKE 1 TABLET TWICE A DAY, Disp: 180 tablet, Rfl: 2 .  furosemide (LASIX) 40 MG tablet, Take 1 tablet by mouth 2 (two) times daily., Disp: , Rfl:  .  glucose blood (ACCU-CHEK GUIDE) test strip, Use as instructed to check blood sugar twice daily. E11.65, Disp: 100 each, Rfl: 6 .  hydroxychloroquine (PLAQUENIL) 200 MG tablet, Take 1 tablet (200 mg total) by mouth 2 (two) times daily., Disp: 60 tablet, Rfl: 4 .  Insulin Glargine (BASAGLAR KWIKPEN) 100 UNIT/ML, Inject 0.2 mLs (20 Units total) into the skin daily. E11.65. may increase by 2 units every 3 days for fasting blood glucose readings greater than 130., Disp: 6 mL, Rfl: 6 .  insulin lispro (HUMALOG KWIKPEN) 100 UNIT/ML KwikPen, For blood sugars 0-150 give 0 units of insulin, 151-200 give 2 units of insulin, 201-250 give 4 units, 251-300 give 6  units, 301-350 give 8 units, 351-400 give 10 units,> 400 give 12 units and call office. Discussed hypoglycemia protocol., Disp: 15 mL, Rfl: 11 .  Insulin  Pen Needle (TRUEPLUS PEN NEEDLES) 32G X 4 MM MISC, Use as directed to inject insulin 5x daily, Disp: 200 each, Rfl: 3 .  Lancet Devices (ONE TOUCH DELICA LANCING DEV) MISC, 1 each by Does not apply route 3 (three) times daily., Disp: 1 each, Rfl: 1 .  losartan (COZAAR) 50 MG tablet, TAKE 1 TABLET BY MOUTH EVERY DAY, Disp: 90 tablet, Rfl: 3 .  medroxyPROGESTERone (DEPO-PROVERA) 150 MG/ML injection, Inject 150 mg into the muscle every 3 (three) months., Disp: , Rfl:  .  metoprolol tartrate (LOPRESSOR) 50 MG tablet, Take 1 tablet (50 mg total) by mouth 2 (two) times daily., Disp: 180 tablet, Rfl: 1 .  OneTouch Delica Lancets 25D MISC, 1 each by Does not apply route 3 (three) times daily., Disp: 100 each, Rfl: 12 .  Phenyleph-CPM-DM-Aspirin (ALKA-SELTZER PLUS COLD & COUGH PO), Take 2 tablets by mouth every 4 (four) hours as needed (cough/congestion). , Disp: , Rfl:  .  predniSONE (DELTASONE) 20 MG tablet, Take 1 tablet (20 mg total) by mouth daily with breakfast., Disp: 30 tablet, Rfl: 0 .  rosuvastatin (CRESTOR) 20 MG tablet, TAKE 1 TABLET BY MOUTH EVERY DAY AT 6PM, Disp: 90 tablet, Rfl: 1 .  traMADol (ULTRAM) 50 MG tablet, Take 1 tablet by mouth as needed. , Disp: , Rfl:  .  cetirizine (ZYRTEC) 10 MG tablet, Take 1 tablet (10 mg total) by mouth daily., Disp: 90 tablet, Rfl: 1      Objective:   Vitals:   04/28/20 1152  BP: 118/78  Pulse: (!) 47  Temp: (!) 97.2 F (36.2 C)  TempSrc: Oral  SpO2: 99%  Weight: 231 lb (104.8 kg)  Height: 5' (1.524 m)    Estimated body mass index is 45.11 kg/m as calculated from the following:   Height as of this encounter: 5' (1.524 m).   Weight as of this encounter: 231 lb (104.8 kg).  @WEIGHTCHANGE @  [employer]   04/28/20 1152  Weight: 231 lb (104.8 kg)     Physical Exam   General: No  distress. obese Neuro: Alert and Oriented x 3. GCS 15. Speech normal Psych: Pleasant Resp: Clear to ausucultation bilaterally. No wheeze No crackles. Distant  BS CVS: Normal heart sounds. Murmurs - no HEENT: Normal upper airway. PEERL +. No post nasal drip         Assessment:       ICD-10-CM   1. Interstitial lung disease due to connective tissue disease ([place])  J84.89    M35.9   2. Interstitial pulmonary disease (H[facility])  J84.9 CT Chest High Resolution  3. Encounter for therapeutic drug monitoring  Z51.81   4. Snoring  R06.83   5. Vaccine counseling  Z71.89        Plan:     Patient Instructions     ICD-10-CM   1. Interstitial lung disease due to connective tissue disease ([place])  J84.89    M35.9   2. Interstitial pulmonary disease ([place])  J84.9   3. Encounter for therapeutic drug monitoring  Z51.81   4. Snoring  R06.83   5. Vaccine counseling  Z71.89      Interstitial lung disease due to connective tissue disease ([place])  -My concern is that the pulmonary fibrosis might be getting worse  Plan  -Restart pirfenidone as per schedule - but space dosing by 5-6 hours  -1 pill 3 times daily with food for 1 week followed by 2 pills 3 times daily with food for 1 week followed by  3 pills 3 times daily to continue  - -Ensure you apply sunscreen at all times when going out  -  for diarrhea take Imodium as needed   -We will do a fresh prescription as well  -Do high-resolution CT chest in 3-4 weeks supine and prone   Snoring Morbid obesity with BMI of 45.0-49.9, adult ([place])  -Have you been seen by a sleep specialist yet?   Plan -RE_Refer Dr. Elsworth Soho or Dr. Leretha Pol in our office for undiagnosed sleep apnea -Ultimately will have to figure out a weight loss plan  Lupus ([place]) -by Dr. Kathlene November  -Glad you are feeling better with intermittent prednisone - Glad plaquenil and immuran is helping    Plan  -per Dr Kathlene November -but asking about taking daily prednisone or not   Pericardial  effusion  -Is described as moderate on the CT scan in October 2020. Same as feb 2020. Per Dr. Virgina Jock this is stable and he is monitoring it.   Plan  - per Dr Virgina Jock  Pulmonary hypertension -seen on right heart catheterization May 2020 but associated with high wedge pressure  --This was seen in the heart catheterization in May 2020 and was at that time because of excess fluid in the body  Plan  - Right heart cath at some point per Dr Virgina Jock  [facility] counseling   - advice covid booster due to immune suppression  Follow-up -Return to see Dr. Chase Caller in 4-8 weeks and a 30-minute slot but after high-resolution CT chest -     SIGNATURE    Dr. Brand Males, M.D., F.C.C.P,  Pulmonary and Critical Care Medicine Staff Physician, [place] Director - Interstitial Lung Disease  Program  Pulmonary [facility] [place], [place], [address]  Pager: [phone], If no answer or between  15:00h - 7:00h: call [phone] Telephone: [phone]  12:41 PM 04/28/2020

## 2020-04-28 NOTE — Telephone Encounter (Signed)
Submitted a Prior Authorization request to [facility] for [place] via Cover My Meds. Will update once we receive a response.   Key: [id number] - PA Case ID: [id number]

## 2020-04-28 NOTE — Patient Instructions (Addendum)
ICD-10-CM   1. Interstitial lung disease due to connective tissue disease ([place])  J84.89    M35.9   2. Interstitial pulmonary disease ([place])  J84.9   3. Encounter for therapeutic drug monitoring  Z51.81   4. Snoring  R06.83   5. Vaccine counseling  Z71.89      Interstitial lung disease due to connective tissue disease ([place])  -My concern is that the pulmonary fibrosis might be getting worse  Plan  -Restart pirfenidone as per schedule - but space dosing by 5-6 hours  -1 pill 3 times daily with food for 1 week followed by 2 pills 3 times daily with food for 1 week followed by 3 pills 3 times daily to continue  - -Ensure you apply sunscreen at all times when going out  -  for diarrhea take Imodium as needed   -We will do a fresh prescription as well  -Do high-resolution CT chest in 3-4 weeks supine and prone   Snoring Morbid obesity with BMI of 45.0-49.9, adult ([place])  -Have you been seen by a sleep specialist yet?   Plan -RE_Refer Dr. Elsworth Soho or Dr. Leretha Pol in our office for undiagnosed sleep apnea -Ultimately will have to figure out a weight loss plan  Lupus ([place]) -by Dr. Kathlene November  -Glad you are feeling better with intermittent prednisone - Glad plaquenil and immuran is helping    Plan  -per Dr Kathlene November -but asking about taking daily prednisone or not   Pericardial effusion  -Is described as moderate on the CT scan in October 2020. Same as feb 2020. Per Dr. Virgina Jock this is stable and he is monitoring it.   Plan  - per Dr Virgina Jock  Pulmonary hypertension -seen on right heart catheterization May 2020 but associated with high wedge pressure  --This was seen in the heart catheterization in May 2020 and was at that time because of excess fluid in the body  Plan  - Right heart cath at some point per Dr Virgina Jock  [facility]ine counseling   - advice covid booster due to immune suppression  Follow-up -Return to see Dr. Chase Caller in 4-8 weeks and a 30-minute slot but after  high-resolution CT chest -

## 2020-04-29 ENCOUNTER — Telehealth (INDEPENDENT_AMBULATORY_CARE_PROVIDER_SITE_OTHER): Payer: 59 | Admitting: Lactation Services

## 2020-04-29 DIAGNOSIS — B373 Candidiasis of vulva and vagina: Secondary | ICD-10-CM

## 2020-04-29 DIAGNOSIS — B3731 Acute candidiasis of vulva and vagina: Secondary | ICD-10-CM

## 2020-04-29 MED ORDER — FLUCONAZOLE 150 MG PO TABS: 150.0000 mg | ORAL_TABLET | Freq: Once | ORAL | 0 refills | Status: AC

## 2020-04-29 NOTE — Telephone Encounter (Signed)
Patient called and LM on voicemail that she saw her My Chart results of + yeast and is wanting treatment.   Called patient and informed her Diflucan has been sent to her Pharmacy. Patient voiced understanding.

## 2020-05-02 ENCOUNTER — Encounter: Payer: Self-pay | Admitting: Nurse Practitioner

## 2020-05-02 ENCOUNTER — Other Ambulatory Visit: Payer: Self-pay | Admitting: Student

## 2020-05-02 ENCOUNTER — Other Ambulatory Visit: Payer: Self-pay | Admitting: Nurse Practitioner

## 2020-05-02 DIAGNOSIS — M329 Systemic lupus erythematosus, unspecified: Secondary | ICD-10-CM

## 2020-05-02 NOTE — Telephone Encounter (Signed)
Can you please try to reach patient to scheduled in office pharmacy visit? Thanks!

## 2020-05-02 NOTE — Telephone Encounter (Addendum)
Contacted patient this morning and afternoon(x2) to schedule pharmacy appointment for [place] counseling. However, patient did not answer phone. Left a HIPAA compliant voicemail to contact pharmacy to schedule an appointment.  Lorel Monaco, PharmD PGY2 Ambulatory Care Resident [place]

## 2020-05-03 ENCOUNTER — Telehealth: Payer: Self-pay | Admitting: Internal Medicine

## 2020-05-03 NOTE — Telephone Encounter (Signed)
Patient scheduled for appointment on 9/30.   Mariella Saa, PharmD, [place], CPP Clinical Specialty Pharmacist (Rheumatology and Pulmonology)  05/03/2020 1:08 PM

## 2020-05-03 NOTE — Telephone Encounter (Signed)
[facility] has reached out to the pt to schedule her CT.  Gave pt [facility]'s number to contact & scheduled.

## 2020-05-04 ENCOUNTER — Other Ambulatory Visit: Payer: Self-pay | Admitting: Internal Medicine

## 2020-05-05 NOTE — Telephone Encounter (Signed)
MR, please advise if you are okay refilling med.

## 2020-05-10 NOTE — Progress Notes (Deleted)
HPI  Patient presents today to [facility] for Follow-Up appt with pharmacy team for [place] counseling.  Patient started Esbriet in November 2020 but discontinued ***. Pertinent past medical history includes ILD secondary to lupus, pulmonary fibrosis, CAD, PAH, CHF, atrial fibrillation, HTN, type 2 diabetes. She is on Imuran and Plaquenil  prescribed by her rheumatologist Dr. Kathlene November (previously followed by Dr. Amil Amen).  OBJECTIVE Allergies  Allergen Reactions  . Celery Oil Shortness Of Breath, Itching, Rash and Other (See Comments)    Bumps on tongue, also  . Peanut-Containing Drug Products Anaphylaxis, Shortness Of Breath, Itching, Rash and Other (See Comments)    Bumps on tongue, also  . Septra [Bactrim] Hives  . Sulfamethoxazole-Trimethoprim Hives    Created by Conversion - 0;      Outpatient Encounter Medications as of 05/12/2020  Medication Sig Note  . acetaminophen (TYLENOL) 500 MG tablet Take 1,000 mg by mouth every 6 (six) hours as needed for headache (pain).   Marland Kitchen albuterol (VENTOLIN HFA) 108 (90 Base) MCG/ACT inhaler INHALE 2 PUFFS BY MOUTH EVERY 6 HOURS AS NEEDED FOR WHEEZING FOR SHORTNESS OF BREATH   . aspirin EC 81 MG tablet Take 1 tablet (81 mg total) by mouth daily. Swallow whole.   . azaTHIOprine (IMURAN) 50 MG tablet Take 50 mg by mouth 2 (two) times daily.   . Blood Glucose Monitoring Suppl (ONETOUCH VERIO) w/Device KIT 1 each by Does not apply route 3 (three) times daily.   . cetirizine (ZYRTEC) 10 MG tablet Take 1 tablet (10 mg total) by mouth daily.   . colchicine 0.6 MG tablet Take 1 tablet (0.6 mg total) by mouth daily.   . Continuous Blood Gluc Receiver (FREESTYLE LIBRE 2 READER) DEVI Use as instructed. Check blood glucose levels five times per day per day. E11.65 Z91.89   . Continuous Blood Gluc Sensor (FREESTYLE LIBRE 2 SENSOR) MISC Use as instructed. Check blood glucose levels 5 times per day. E11.65 Z91.89   . diltiazem (CARDIZEM CD) 240 MG 24 hr capsule  TAKE 1 CAPSULE BY MOUTH EVERY DAY   . Dulaglutide (TRULICITY) 1.60 VP/7.1GG SOPN Inject 0.5 mLs (0.75 mg total) into the skin once a week.   Marland Kitchen ELIQUIS 5 MG TABS tablet TAKE 1 TABLET TWICE A DAY   . furosemide (LASIX) 40 MG tablet Take 1 tablet by mouth 2 (two) times daily.   Marland Kitchen glucose blood (ACCU-CHEK GUIDE) test strip Use as instructed to check blood sugar twice daily. E11.65   . hydroxychloroquine (PLAQUENIL) 200 MG tablet Take 1 tablet (200 mg total) by mouth 2 (two) times daily.   . Insulin Glargine (BASAGLAR KWIKPEN) 100 UNIT/ML Inject 0.2 mLs (20 Units total) into the skin daily. E11.65. may increase by 2 units every 3 days for fasting blood glucose readings greater than 130.   Marland Kitchen insulin lispro (HUMALOG KWIKPEN) 100 UNIT/ML KwikPen For blood sugars 0-150 give 0 units of insulin, 151-200 give 2 units of insulin, 201-250 give 4 units, 251-300 give 6 units, 301-350 give 8 units, 351-400 give 10 units,> 400 give 12 units and call office. Discussed hypoglycemia protocol.   . Insulin Pen Needle (TRUEPLUS PEN NEEDLES) 32G X 4 MM MISC Use as directed to inject insulin 5x daily   . Lancet Devices (ONE TOUCH DELICA LANCING DEV) MISC 1 each by Does not apply route 3 (three) times daily.   Marland Kitchen losartan (COZAAR) 50 MG tablet TAKE 1 TABLET BY MOUTH EVERY DAY   . medroxyPROGESTERone (DEPO-PROVERA) 150 MG/ML injection  Inject 150 mg into the muscle every 3 (three) months. 12/08/2018: Next injection due 12/24/18  . metoprolol tartrate (LOPRESSOR) 50 MG tablet Take 1 tablet (50 mg total) by mouth 2 (two) times daily.   Glory Rosebush Delica Lancets 08X MISC 1 each by Does not apply route 3 (three) times daily.   Marland Kitchen Phenyleph-CPM-DM-Aspirin (ALKA-SELTZER PLUS COLD & COUGH PO) Take 2 tablets by mouth every 4 (four) hours as needed (cough/congestion).    . predniSONE (DELTASONE) 20 MG tablet Take 1 tablet (20 mg total) by mouth daily with breakfast.   . rosuvastatin (CRESTOR) 20 MG tablet TAKE 1 TABLET BY MOUTH EVERY DAY AT  6PM   . traMADol (ULTRAM) 50 MG tablet Take 1 tablet by mouth as needed.    . [DISCONTINUED] insulin aspart (NOVOLOG FLEXPEN) 100 UNIT/ML FlexPen For blood sugars 0-150 give 0 units of insulin, 151-200 give 2 units of insulin, 201-250 give 4 units, 251-300 give 6 units, 301-350 give 8 units, 351-400 give 10 units,> 400 give 12 units and call M.D. Discussed hypoglycemia protocol.    No facility-administered encounter medications on file as of 05/12/2020.     Immunization History  Administered Date(s) Administered  . Influenza,inj,Quad PF,6+ Mos 05/25/2013, 05/14/2018, 05/13/2019  . PFIZER SARS-COV-2 Vaccination 11/20/2019, 12/15/2019  . Pneumococcal Polysaccharide-23 05/14/2018  . Tdap 07/09/2018     PFT's TLC  Date Value Ref Range Status  05/12/2019 2.72 L Final     CMP     Component Value Date/Time   NA 137 04/19/2020 1431   K 3.9 04/19/2020 1431   CL 96 04/19/2020 1431   CO2 26 04/19/2020 1431   GLUCOSE 415 (H) 04/19/2020 1431   GLUCOSE 106 (H) 07/06/2019 1655   BUN 19 04/19/2020 1431   CREATININE 1.07 (H) 04/19/2020 1431   CALCIUM 9.6 04/19/2020 1431   PROT 7.4 04/19/2020 1431   ALBUMIN 4.0 04/19/2020 1431   AST 16 04/19/2020 1431   ALT 17 04/19/2020 1431   ALKPHOS 86 04/19/2020 1431   BILITOT 0.7 04/19/2020 1431   GFRNONAA 63 04/19/2020 1431   GFRAA 73 04/19/2020 1431     CBC    Component Value Date/Time   WBC 12.4 (H) 04/19/2020 1431   WBC 4.0 07/06/2019 1655   RBC 5.20 04/19/2020 1431   RBC 4.76 07/06/2019 1655   HGB 13.2 04/19/2020 1431   HCT 43.1 04/19/2020 1431   PLT 312 04/19/2020 1431   MCV 83 04/19/2020 1431   MCH 25.4 (L) 04/19/2020 1431   MCH 25.0 (L) 07/06/2019 1655   MCHC 30.6 (L) 04/19/2020 1431   MCHC 31.7 07/06/2019 1655   RDW 16.5 (H) 04/19/2020 1431   LYMPHSABS 1.0 04/13/2019 1425   LYMPHSABS 0.6 (L) 03/05/2019 1433   MONOABS 0.6 04/13/2019 1425   EOSABS 0.0 04/13/2019 1425   EOSABS 0.1 03/05/2019 1433   BASOSABS 0.0 04/13/2019 1425     BASOSABS 0.0 03/05/2019 1433     LFT's Hepatic Function Latest Ref Rng & Units 04/19/2020 09/07/2019 08/19/2019  Total Protein 6.0 - 8.5 g/dL 7.4 7.3 7.7  Albumin 3.8 - 4.8 g/dL 4.0 3.6(L) 3.8  AST 0 - 40 IU/L 16 15 16   ALT 0 - 32 IU/L 17 12 10   Alk Phosphatase 48 - 121 IU/L 86 90 61  Total Bilirubin 0.0 - 1.2 mg/dL 0.7 <0.2 0.2  Bilirubin, Direct 0.0 - 0.3 mg/dL - - 0.1     ASSESSMENT  1. Esbriet Medication Management  Patient counseled on purpose, proper use,  and potential adverse effects including nausea, vomiting, abdominal pain, GERD, weight loss, arthralgia, dizziness, and suns sensitivity/rash.  Stressed the importance of routine lab monitoring. Will monitor LFT's every month for the first 6 months of treatment then every 3 months. Will monitor CBC every 3 months.  Starting dose will be Esbriet 267 mg 1 tablet three times daily for 7 days, then 2 tablets three times daily for 7 days, then 3 tablets three times daily.  Maintenance dose will be 801 mg 1 tablet three times daily if tolerated.  Stressed the importance of taking with meals to minimize stomach upset.    Patient insurance changed since last approval and co-pay is ~$5000 dollar for 30 day supply.  She has $2,900 unmet deductible.  She was previously enrolled in [place] co-pay card which should help cover the cost of the medication. Will look into patient assistance if exhaust co-pay card funds.  2. Medication Reconciliation  A drug regimen assessment was performed, including review of allergies, interactions, disease-state management, dosing and immunization history. Medications were reviewed with the patient, including name, instructions, indication, goals of therapy, potential side effects, importance of adherence, and safe use.  3. Immunizations  Patient is up to date with pneumonia, tetanus, and Covid-19 vaccine.  Recommend annual influenza.  PLAN ***  All questions encouraged and answered.  Instructed patient to  call with any further questions or concerns.  Thank you for allowing pharmacy to participate in this patient's care.  This appointment required  {CHL ONC TIME VISIT - MLJQG:[id number]} of patient care (this includes precharting, chart review, review of results, face-to-face care, etc.).

## 2020-05-12 ENCOUNTER — Other Ambulatory Visit: Payer: 59

## 2020-05-13 ENCOUNTER — Other Ambulatory Visit: Payer: Self-pay

## 2020-05-13 MED ORDER — HYDROXYCHLOROQUINE SULFATE 200 MG PO TABS: 200.0000 mg | ORAL_TABLET | Freq: Two times a day (BID) | ORAL | 3 refills | Status: AC

## 2020-05-16 ENCOUNTER — Ambulatory Visit: Payer: Medicaid Other | Admitting: Internal Medicine

## 2020-05-16 ENCOUNTER — Ambulatory Visit: Payer: Medicaid Other | Admitting: Pharmacist

## 2020-05-17 MED FILL — ?HUMALOG 100 UNITS/ML KWIKP: 100 | 25 days supply | Qty: 3 | Fill #1

## 2020-05-19 ENCOUNTER — Other Ambulatory Visit: Payer: Self-pay

## 2020-05-19 ENCOUNTER — Ambulatory Visit (INDEPENDENT_AMBULATORY_CARE_PROVIDER_SITE_OTHER): Payer: 59

## 2020-05-19 VITALS — BP 145/85 | HR 108 | Wt 230.4 lb

## 2020-05-19 DIAGNOSIS — Z3042 Encounter for surveillance of injectable contraceptive: Secondary | ICD-10-CM | POA: Diagnosis not present

## 2020-05-19 MED ORDER — MEDROXYPROGESTERONE ACETATE 150 MG/ML IM SUSP
150.0000 mg | Freq: Once | INTRAMUSCULAR | Status: AC
  Administered 2020-05-19: 150 mg via INTRAMUSCULAR

## 2020-05-19 NOTE — Progress Notes (Signed)
[name] here for Depo-Provera  Injection.  Injection administered without complication. Patient will return in 3 months for next injection and annual exam.    Mel Almond, BSN, RN 05/19/2020  8:33 AM

## 2020-05-24 ENCOUNTER — Telehealth: Payer: Self-pay | Admitting: Internal Medicine

## 2020-05-24 DIAGNOSIS — R0602 Shortness of breath: Secondary | ICD-10-CM

## 2020-05-24 DIAGNOSIS — R053 Chronic cough: Secondary | ICD-10-CM

## 2020-05-24 MED ORDER — ALBUTEROL SULFATE HFA 108 (90 BASE) MCG/ACT IN AERS
2.0000 | INHALATION_SPRAY | Freq: Four times a day (QID) | RESPIRATORY_TRACT | 5 refills | Status: AC | PRN
Start: 2020-05-24 — End: ?

## 2020-05-24 NOTE — Telephone Encounter (Signed)
Primary Pulmonologist: MR Last office visit and with whom: 04/28/20 with MR What do we see them for (pulmonary problems): ILD Last OV assessment/plan:  Plan:     Patient Instructions     ICD-10-CM   1. Interstitial lung disease due to connective tissue disease ([place])  J84.89    M35.9   2. Interstitial pulmonary disease ([place])  J84.9   3. Encounter for therapeutic drug monitoring  Z51.81   4. Snoring  R06.83   5. Vaccine counseling  Z71.89      Interstitial lung disease due to connective tissue disease ([place])  -My concern is that the pulmonary fibrosis might be getting worse  Plan  -Restart pirfenidone as per schedule - but space dosing by 5-6 hours             -1 pill 3 times daily with food for 1 week followed by 2 pills 3 times daily with food for 1 week followed by 3 pills 3 times daily to continue             - -Ensure you apply sunscreen at all times when going out             -  for diarrhea take Imodium as needed              -We will do a fresh prescription as well  -Do high-resolution CT chest in 3-4 weeks supine and prone   Snoring Morbid obesity with BMI of 45.0-49.9, adult ([place])  -Have you been seen by a sleep specialist yet?   Plan -RE_Refer Dr. Elsworth Soho or Dr. Leretha Pol in our office for undiagnosed sleep apnea -Ultimately will have to figure out a weight loss plan  Lupus ([place]) -by Dr. Kathlene November  -Glad you are feeling better with intermittent prednisone - Glad plaquenil and immuran is helping    Plan  -per Dr Kathlene November -but asking about taking daily prednisone or not   Pericardial effusion  -Is described as moderate on the CT scan in October 2020. Same as feb 2020. Per Dr. Virgina Jock this is stable and he is monitoring it.   Plan  - per Dr Virgina Jock  Pulmonary hypertension -seen on right heart catheterization May 2020 but associated with high wedge pressure  --This was seen in the heart catheterization in May 2020 and was at  that time because of excess fluid in the body  Plan  - Right heart cath at some point per Dr Virgina Jock  [facility] counseling   - advice covid booster due to immune suppression  Follow-up -Return to see Dr. Chase Caller in 4-8 weeks and a 30-minute slot but after high-resolution CT chest  Was appointment offered to patient (explain)?  Pt wants recommendations   Reason for call: Called and spoke with pt who stated she began having increased SOB x6 days but states her breathing had gotten worse x3 days ago. Pt has a productive cough with white phlegm. Pt denies any complaints of wheezing and chest tightness.  Pt states that she went to see rheumatologist 9/20 and stated the rheumatologist said he was going to reach out to MR about pt starting on a new med due to her having lupus along with the ILD.  Pt stopped taking prednisone 9/22 but due to her current symptoms, she is wondering if she needs to go back on it.  Asked pt if she has had to use her albuterol inhaler and she said she has been out of it since July 2021. Stated to  pt that I would renew her albuterol RX and she verbalized understanding.  MR, based off of pt's symptoms that she is having, please advise recommendations and also if we should start pt back on prednisone.    Allergies  Allergen Reactions  . Celery Oil Shortness Of Breath, Itching, Rash and Other (See Comments)    Bumps on tongue, also  . Peanut-Containing Drug Products Anaphylaxis, Shortness Of Breath, Itching, Rash and Other (See Comments)    Bumps on tongue, also  . Septra [Bactrim] Hives  . Sulfamethoxazole-Trimethoprim Hives    Created by Conversion - 0;      Immunization History  Administered Date(s) Administered  . Influenza,inj,Quad PF,6+ Mos 05/25/2013, 05/14/2018, 05/13/2019  . PFIZER SARS-COV-2 Vaccination 11/20/2019, 12/15/2019  . Pneumococcal Polysaccharide-23 05/14/2018  . Tdap 07/09/2018

## 2020-05-25 MED ORDER — PREDNISONE 10 MG PO TABS: ORAL_TABLET | ORAL | 0 refills | Status: DC

## 2020-05-25 NOTE — Telephone Encounter (Signed)
LMTCB x 1 

## 2020-05-25 NOTE — Telephone Encounter (Signed)
Spoke with the pt and notified of response per MR  She verbalized understanding  Rx for pred sent  She has not started the [place] yet bc Dr Dossie Der is thinking about starting her on another medication  She is waiting to hear back from their office

## 2020-05-25 NOTE — Telephone Encounter (Signed)
Pt returning a phone call. Pt can be reached at [phone]

## 2020-05-25 NOTE — Telephone Encounter (Signed)
  She should take prednisone - atleast short term for now Please take Take prednisone 40mg  once daily x 3 days, then 30mg  once daily x 3 days, then 20mg  once daily x 3 days, then prednisone 10mg  once daily  x 3 days and then prednisone 5mg  daily x 3 days and stop   Has she started esbriet yet? . If so, she needs followup 4-6 weeks from that date -> check LFT  Last covid shot was May 2021 she should go get booster next week once some better esp due to obesity and lupus   Allergies  Allergen Reactions  . Celery Oil Shortness Of Breath, Itching, Rash and Other (See Comments)    Bumps on tongue, also  . Peanut-Containing Drug Products Anaphylaxis, Shortness Of Breath, Itching, Rash and Other (See Comments)    Bumps on tongue, also  . Septra [Bactrim] Hives  . Sulfamethoxazole-Trimethoprim Hives    Created by Conversion - 0;      Immunization History  Administered Date(s) Administered  . Influenza,inj,Quad PF,6+ Mos 05/25/2013, 05/14/2018, 05/13/2019  . PFIZER SARS-COV-2 Vaccination 11/20/2019, 12/15/2019  . Pneumococcal Polysaccharide-23 05/14/2018  . Tdap 07/09/2018

## 2020-05-27 ENCOUNTER — Ambulatory Visit: Payer: 59

## 2020-06-01 ENCOUNTER — Inpatient Hospital Stay: Admission: RE | Admit: 2020-06-01 | Payer: 59 | Source: Ambulatory Visit

## 2020-06-03 ENCOUNTER — Inpatient Hospital Stay: Admission: RE | Admit: 2020-06-03 | Payer: 59 | Source: Ambulatory Visit

## 2020-06-07 ENCOUNTER — Ambulatory Visit: Payer: 59 | Admitting: Internal Medicine

## 2020-06-07 ENCOUNTER — Other Ambulatory Visit: Payer: Self-pay

## 2020-06-07 ENCOUNTER — Encounter: Payer: Self-pay | Admitting: Primary Care

## 2020-06-07 ENCOUNTER — Ambulatory Visit (INDEPENDENT_AMBULATORY_CARE_PROVIDER_SITE_OTHER): Payer: 59 | Admitting: Primary Care

## 2020-06-07 VITALS — BP 130/70 | HR 98 | Temp 97.4°F | Ht 60.0 in | Wt 232.4 lb

## 2020-06-07 DIAGNOSIS — M359 Systemic involvement of connective tissue, unspecified: Secondary | ICD-10-CM | POA: Diagnosis not present

## 2020-06-07 DIAGNOSIS — R0600 Dyspnea, unspecified: Secondary | ICD-10-CM

## 2020-06-07 DIAGNOSIS — J849 Interstitial pulmonary disease, unspecified: Secondary | ICD-10-CM

## 2020-06-07 DIAGNOSIS — J8489 Other specified interstitial pulmonary diseases: Secondary | ICD-10-CM

## 2020-06-07 MED ORDER — PREDNISONE 20 MG PO TABS: 20.0000 mg | ORAL_TABLET | Freq: Every day | ORAL | 0 refills | Status: DC

## 2020-06-07 MED ORDER — ESBRIET 267 MG PO CAPS: ORAL_CAPSULE | ORAL | 0 refills | Status: DC

## 2020-06-07 NOTE — Progress Notes (Signed)
_0  ID: [name], female    DOB: 1975-11-10, 44 y.o.   MRN: [id number]  Chief Complaint  Patient presents with  . Follow-up    Pt has been having more problems with her breathing stating that she is SOB all the time which has been worse the last 2 days. Pt also has a persistent cough with clear phlegm. Denies any complaints of CP.    Referring provider: Gildardo Pounds, NP  HPI: 44 year old female, never smoked. PMH significant for ILD, lupus, afib, CHF, pericardial effusion. Patient of Dr. Chase Caller.   Patient recently admitted at outside [place] in June 1-6th with significant dyspnea, nonproductive cough and orthopnea.  Initial CT angio chest negative for PE but showed multifocal involvement with extensive bilateral patchy groundglass lung opacification.  Autoimmune process versus atypical infection on differential with underlying pulmonary edema.  Pulmonary and rheumatology were consulted.  Patient received IV steroids, Lasix and broad-spectrum antibiotics.  She eventually improved.  Labs were remarkable for elevated ANA titers, nuclear pattern, anti-RNP and SM/R NP space AB which was consistent with flare of lupus and MC TD.  Patient improved with steroid taper. She was discharged on Prednisone 72m x 5 days (to finish 1 week); then reduced to 255mx 1 week; then 1054m 1 week; then 5mg59m1 week then stop. She required 1 L of oxygen with ambulation on day of discharge.  Significant medication change; Started on torsemide 20 mg twice daily, increase metoprolol 100 mg twice daily and increase losartan 100 mg daily. Stopped lasix and Eliquis stopped. Esbriet on hold until follow-up with pulmonary.  Previous [facility] pulmonary encounter: TwanCheral Almasy26. -presents for follow-up of her multiple issues particularly ILD.  She has been off prednisone since October 2020.  She was continuing pirfenidone I last had a telephone visit with her in spring 2021.  After that  she [place] her mom in [place] in June 2021.  [facility]  At the discharge she was given prednisone taper to off.  [place] taper to off.  However she ended up seeing my nurse practitioner in August 2021 with another respiratory flare and she is on a third prednisone taper in 3 months.  This taper ends in a few days.  She says every time she takes prednisone she starts feeling better.  Overall she feels her health is much more stable than June 2021 when she was hospitalized.  She also feels is stable similar to last year.  However ILD symptom questionnaire shows significant worsening of her symptoms compared to even earlier this year.  Pulmonary function test done recently shows decline.  Her last CT scan of the chest was in October 2020.  She is [facility].  I have left it to her choice.  She is known to have high risk for sleep apnea.  She missed seeing our sleep doctors here.  She continues on Imuran and Plaquenil  06/07/2020 Patient presents today for 1 month follow-up. During last visit she was restarted on pirfenidone. Ordered for HRCT in 4 weeks. Complains of shortness of breath since October 12th. She was sent in RX for prednisone taper. She took last dose 10mg26mdnisone today. O2 was 75% on RA today, this improved to 96% on 3L. She  has not started Esbriet yet, reports that Dr. Dossie Der was thinking about starting another medicatoin. She last saw rheumatology in September 2021. She is currently in-active with them d/t bill. She has 877m Esbriet at home, she does not have lower dose to start taper. She has had oxygen at home before but does not currently. She has been having diarrhea and muscle aches. Last covid shot was in May. Cardiologist is Dr. PFrancee Piccolo    Simple office walk 185 feet x  3 laps goal with  forehead probe 06/11/2019  08/19/2019 Esbreit 04/28/2020 Off esbriet since June 2021 06/07/2020 Off Esbriet since June 2021  O2 used RA RA  75% RA ; 96% 3L  Number laps completed _0 Lap  Comments about pace Nl pace Slow pace due to arthralgia    Resting Pulse Ox/HR 98% and 104/min 100% and 102/min 99% and 66/min   Final Pulse Ox/HR 96% and 127/min 98% and 118/min 90% and 92/mn   Desaturated </= 88% no no    Desaturated <= 3% points no no    Got Tachycardic >/= 90/min yes yes    Symptoms at end of test Very mild dyspnea Mild dyspnea dyspneic Dyspneic   Miscellaneous comments *feeling better        Allergies  Allergen Reactions  . Septra [Bactrim] Hives  . Sulfamethoxazole-Trimethoprim Hives         Immunization History  Administered Date(s) Administered  . Influenza,inj,Quad PF,6+ Mos 05/25/2013, 05/14/2018, 05/13/2019  . PFIZER SARS-COV-2 Vaccination 11/20/2019, 12/15/2019  . Pneumococcal Polysaccharide-23 05/14/2018  . Tdap 07/09/2018    Past Medical History:  Diagnosis Date  . Abnormal Pap smear of cervix    colpo, HPV  . Allergy   . Anemia   . Atrial fibrillation [place]   . [place]estive heart failure) ([place]   . Depression    after losses  . Dyspnea   . Enlarged heart    managed by cardiology  . Fibroid   . Gestational diabetes   . Heart murmur   . Hypertension   . Infection    UTI  . [place]upus [place]  . Pericarditis     Tobacco History: Social History   Tobacco Use  Smoking Status Never Smoker  Smokeless Tobacco Never Used   Counseling given: Not Answered   No facility-administered medications prior to visit.   Outpatient Medications Prior to Visit  Medication Sig Dispense Refill  . acetaminophen (TYLENOL) 500 MG tablet Take 1,000 mg by mouth every 6 (six) hours as needed for headache (pain).    .Marland Kitchenalbuterol (VENTOLIN HFA) 108 (90 Base) MCG/ACT inhaler Inhale 2 puffs into the lungs every 6 (six) hours as needed for wheezing or shortness of  breath. 8 g 5  . aspirin EC 81 MG tablet Take 1 tablet (81 mg total) by mouth daily. Swallow whole. 90 tablet 3  . azaTHIOprine (IMURAN) 50 MG tablet Take 50 mg by mouth 2 (two) times daily.    . Blood Glucose Monitoring Suppl (ONETOUCH VERIO) w/Device KIT 1 each by Does not apply route 3 (three) times daily. 1 kit 0  . Continuous Blood Gluc Receiver (FREESTYLE LIBRE 2 READER) DEVI Use as instructed. Check blood glucose levels five times per day per day. E11.65 Z91.89 1 each 1  . Continuous Blood Gluc Sensor (FREESTYLE LIBRE 2 SENSOR) MISC Use as instructed. Check blood glucose levels 5 times per day. E11.65 Z91.89 1 each 6  . diltiazem (CARDIZEM CD) 240 MG 24 hr  capsule TAKE 1 CAPSULE BY MOUTH EVERY DAY (Patient taking differently: Take 240 mg by mouth daily. ) 90 capsule 2  . Dulaglutide (TRULICITY) 4.16 LA/4.5XM SOPN Inject 0.5 mLs (0.75 mg total) into the skin once a week. (Patient taking differently: Inject 0.75 mg into the skin every Thursday. ) 6 mL 1  . ELIQUIS 5 MG TABS tablet TAKE 1 TABLET TWICE A DAY (Patient taking differently: Take 5 mg by mouth 2 (two) times daily. ) 180 tablet 2  . glucose blood (ACCU-CHEK GUIDE) test strip Use as instructed to check blood sugar twice daily. E11.65 100 each 6  . hydroxychloroquine (PLAQUENIL) 200 MG tablet Take 1 tablet (200 mg total) by mouth 2 (two) times daily. 60 tablet 3  . Insulin Glargine (BASAGLAR KWIKPEN) 100 UNIT/ML Inject 0.2 mLs (20 Units total) into the skin daily. E11.65. may increase by 2 units every 3 days for fasting blood glucose readings greater than 130. (Patient taking differently: Inject 20 Units into the skin at bedtime. ) 6 mL 6  . insulin lispro (HUMALOG KWIKPEN) 100 UNIT/ML KwikPen For blood sugars 0-150 give 0 units of insulin, 151-200 give 2 units of insulin, 201-250 give 4 units, 251-300 give 6 units, 301-350 give 8 units, 351-400 give 10 units,> 400 give 12 units and call office. Discussed hypoglycemia protocol. (Patient  taking differently: Inject 2-10 Units into the skin See admin instructions. Inject 2-10 units subcutaneously three times daily with meals per sliding scale: CBG 0-150  0 units of insulin, 151-200 inject 2 units of insulin, 201-250  4 units, 251-300  6 units, 301-350  8 units, 351-400  10 units,> 400 12 units and call office. Discussed hypoglycemia protocol.) 15 mL 11  . Insulin Pen Needle (TRUEPLUS PEN NEEDLES) 32G X 4 MM MISC Use as directed to inject insulin 5x daily 200 each 3  . Lancet Devices (ONE TOUCH DELICA LANCING DEV) MISC 1 each by Does not apply route 3 (three) times daily. 1 each 1  . losartan (COZAAR) 50 MG tablet TAKE 1 TABLET BY MOUTH EVERY DAY (Patient taking differently: Take 50 mg by mouth daily. ) 90 tablet 3  . medroxyPROGESTERone (DEPO-PROVERA) 150 MG/ML injection Inject 150 mg into the muscle every 3 (three) months.    Glory Rosebush Delica Lancets 46O MISC 1 each by Does not apply route 3 (three) times daily. 100 each 12  . Phenyleph-CPM-DM-Aspirin (ALKA-SELTZER PLUS COLD & COUGH PO) Take 2 tablets by mouth every 4 (four) hours as needed (cough/congestion).     . rosuvastatin (CRESTOR) 20 MG tablet TAKE 1 TABLET BY MOUTH EVERY DAY AT 6PM (Patient taking differently: Take 20 mg by mouth daily at 6 PM. TAKE 1 TABLET BY MOUTH EVERY DAY AT 6PM) 90 tablet 1  . furosemide (LASIX) 40 MG tablet Take 40 mg by mouth 2 (two) times daily.     . metoprolol tartrate (LOPRESSOR) 50 MG tablet Take 1 tablet (50 mg total) by mouth 2 (two) times daily. 180 tablet 1  . traMADol (ULTRAM) 50 MG tablet Take 1 tablet by mouth as needed.     . colchicine 0.6 MG tablet Take 1 tablet (0.6 mg total) by mouth daily. (Patient not taking: Reported on 06/10/2020) 60 tablet 1  . predniSONE (DELTASONE) 10 MG tablet 4 x 3 days, 3 x 3 days, 2 x 3 days, 1 x 3 days, 1/2 x 3 days then stop 32 tablet 0  . predniSONE (DELTASONE) 20 MG tablet Take 1 tablet (20 mg total)  by mouth daily with breakfast. (Patient not taking:  Reported on 05/19/2020) 30 tablet 0   Review of Systems  Review of Systems  Respiratory: Positive for shortness of breath. Negative for chest tightness and wheezing.   Cardiovascular: Negative for leg swelling.   Physical Exam  BP 130/70 (BP Location: Left Arm, Cuff Size: Large)   Pulse 98   Temp (!) 97.4 F (36.3 C) (Other (Comment)) Comment (Src): wrist  Ht 5' (1.524 m)   Wt 232 lb 6.4 oz (105.4 kg)   SpO2 96% Comment: 3L  BMI 45.39 kg/m  Physical Exam Constitutional:      Appearance: Normal appearance. She is obese.  HENT:     Head: Normocephalic and atraumatic.     Mouth/Throat:     Mouth: Mucous membranes are moist.     Pharynx: Oropharynx is clear.     Comments: Patent airway, uvula midline; Mallampati class I Cardiovascular:     Rate and Rhythm: Tachycardia present. Rhythm irregular.  Pulmonary:     Effort: Pulmonary effort is normal.     Breath sounds: Rales present. No wheezing or rhonchi.  Neurological:     Mental Status: She is alert.  Psychiatric:        Mood and Affect: Mood normal.        Behavior: Behavior normal.        Thought Content: Thought content normal.        Judgment: Judgment normal.      Lab Results:  CBC    Component Value Date/Time   WBC 7.7 06/11/2020 0051   RBC 4.98 06/11/2020 0051   HGB 11.9 (L) 06/11/2020 0051   HGB 13.2 04/19/2020 1431   HCT 39.2 06/11/2020 0051   HCT 43.1 04/19/2020 1431   PLT 493 (H) 06/11/2020 0051   PLT 312 04/19/2020 1431   MCV 78.7 (L) 06/11/2020 0051   MCV 83 04/19/2020 1431   MCH 23.9 (L) 06/11/2020 0051   MCHC 30.4 06/11/2020 0051   RDW 16.8 (H) 06/11/2020 0051   RDW 16.5 (H) 04/19/2020 1431   LYMPHSABS 1.1 06/11/2020 0051   LYMPHSABS 0.6 (L) 03/05/2019 1433   MONOABS 0.6 06/11/2020 0051   EOSABS 0.0 06/11/2020 0051   EOSABS 0.1 03/05/2019 1433   BASOSABS 0.0 06/11/2020 0051   BASOSABS 0.0 03/05/2019 1433    BMET    Component Value Date/Time   NA 137 06/13/2020 0810   NA 137  04/19/2020 1431   K 3.6 06/13/2020 0810   CL 102 06/13/2020 0810   CO2 22 06/13/2020 0810   GLUCOSE 266 (H) 06/13/2020 0810   BUN 17 06/13/2020 0810   BUN 19 04/19/2020 1431   CREATININE 1.02 (H) 06/13/2020 0810   CALCIUM 9.2 06/13/2020 0810   GFRNONAA >60 06/13/2020 0810   GFRAA 73 04/19/2020 1431    BNP    Component Value Date/Time   BNP 210.1 (H) 06/10/2020 1040    ProBNP    Component Value Date/Time   PROBNP 69.0 08/18/2008 1430    Imaging: DG Chest Portable 1 View  Result Date: 06/10/2020 CLINICAL DATA:  Shortness of breath. History AFib, CHF, diabetes, hypertension, enlarged heart, lupus, pericarditis. EXAM: PORTABLE CHEST 1 VIEW COMPARISON:  04/05/2020 FINDINGS: Similar enlarged cardiac silhouette. Mild diffuse interstitial thickening and hazy airspace opacities. More dense left basilar opacity. No visible pleural effusions or pneumothorax. No acute osseous abnormality. IMPRESSION: 1. Mild diffuse interstitial thickening and hazy airspace opacities, suggestive of mild pulmonary edema. 2. More dense left basilar  consolidation may represent atelectasis and/or pneumonia. 3. Similar enlarged cardiac silhouette, which may represent cardiomegaly and/or pericardial effusion in this patient with a history of pericarditis. Electronically Signed   By: Margaretha Sheffield MD   On: 06/10/2020 11:07   ECHOCARDIOGRAM COMPLETE  Result Date: 06/10/2020    ECHOCARDIOGRAM REPORT   Patient Name:   DESIA SABAN Date of Exam: 06/10/2020 Medical Rec #:  [id number]         Height:       60.0 in Accession #:    [id number]        Weight:       232.0 lb Date of Birth:  08-Dec-1975         BSA:          1.988 m Patient Age:    89 years          BP:           107/62 mmHg Patient Gender: F                 HR:           110 bpm. Exam Location:  Inpatient Procedure: 2D Echo, Cardiac Doppler, Color Doppler and Intracardiac            Opacification Agent Indications:     Dyspnea  History:         Patient  has prior history of Echocardiogram examinations, most                  recent 12/10/2018. CHF, Aortic Valve Disease, Arrythmias:Atrial                  Fibrillation and Atrial Flutter; Risk Factors:Diabetes and                  Hypertension. Lupus.  Sonographer:     Clayton Lefort RDCS (AE) Referring Phys:  [id number] Elnora Morrison Diagnosing Phys: Vernell Leep MD IMPRESSIONS  1. Left ventricular ejection fraction, by estimation, is 55 to 60%. The left ventricle has normal function. The left ventricle has no regional wall motion abnormalities. There is mild left ventricular hypertrophy. Left ventricular diastolic parameters are indeterminate.  2. Right ventricular systolic function is mildly reduced. The right ventricular size is not well visualized.  3. Left atrial size was moderately dilated.  4. Right atrial size was mildly dilated.  5. Moderate pericardial effusion. The pericardial effusion is posterior to the left ventricle and the left atrium. There is no evidence of cardiac tamponade.  6. No significant change compared to previous outpatient study in 10/2019. FINDINGS  Left Ventricle: Left ventricular ejection fraction, by estimation, is 55 to 60%. The left ventricle has normal function. The left ventricle has no regional wall motion abnormalities. The left ventricular internal cavity size was normal in size. There is  mild left ventricular hypertrophy. Left ventricular diastolic parameters are indeterminate. Right Ventricle: The right ventricular size is not well visualized. No increase in right ventricular wall thickness. Right ventricular systolic function is mildly reduced. Left Atrium: Left atrial size was moderately dilated. Right Atrium: Right atrial size was mildly dilated. Pericardium: A moderately sized pericardial effusion is present. The pericardial effusion is posterior to the left ventricle and the left atrium. There is no evidence of cardiac tamponade. Mitral Valve: The mitral valve is grossly  normal. No evidence of mitral valve regurgitation. Tricuspid Valve: The tricuspid valve is not well visualized. Tricuspid valve regurgitation is not demonstrated. Aortic Valve: The aortic valve  is tricuspid. Aortic valve regurgitation is not visualized. Mild aortic stenosis is present. Aortic valve mean gradient measures 10.2 mmHg. Aortic valve peak gradient measures 17.2 mmHg. Aortic valve area, by VTI measures 1.14 cm. Pulmonic Valve: The pulmonic valve was not assessed. Pulmonic valve regurgitation is not visualized. Aorta: The aortic root is normal in size and structure. IAS/Shunts: No atrial level shunt detected by color flow Doppler.  LEFT VENTRICLE PLAX 2D LVIDd:         4.30 cm LVIDs:         3.00 cm LV PW:         1.50 cm LV IVS:        1.40 cm LVOT diam:     2.00 cm LV SV:         41 LV SV Index:   20 LVOT Area:     3.14 cm  RIGHT VENTRICLE            IVC RV Basal diam:  3.40 cm    IVC diam: 1.80 cm RV S prime:     7.62 cm/s TAPSE (M-mode): 1.0 cm LEFT ATRIUM              Index       RIGHT ATRIUM           Index LA diam:        4.60 cm  2.31 cm/m  RA Area:     19.20 cm LA Vol (A2C):   61.7 ml  31.03 ml/m RA Volume:   51.00 ml  25.65 ml/m LA Vol (A4C):   140.0 ml 70.42 ml/m LA Biplane Vol: 100.0 ml 50.30 ml/m  AORTIC VALVE AV Area (Vmax):    1.17 cm AV Area (Vmean):   1.13 cm AV Area (VTI):     1.14 cm AV Vmax:           207.20 cm/s AV Vmean:          149.000 cm/s AV VTI:            0.357 m AV Peak Grad:      17.2 mmHg AV Mean Grad:      10.2 mmHg LVOT Vmax:         77.06 cm/s LVOT Vmean:        53.800 cm/s LVOT VTI:          0.130 m LVOT/AV VTI ratio: 0.36  AORTA Ao Root diam: 3.10 cm Ao Asc diam:  3.40 cm  SHUNTS Systemic VTI:  0.13 m Systemic Diam: 2.00 cm Vernell Leep MD Electronically signed by Vernell Leep MD Signature Date/Time: 06/10/2020/4:25:58 PM    Final      Assessment & Plan:   ILD (interstitial lung disease) ([place]) - Presents today for 1 month follow-up. She has  been off Esbriet since June 2021. Reports increased shortness of breath x 1-2 weeks. O2 75% on RA, improved > 90% on 3L. She has been on oxygen in the past, she does not current have O2 at home. Recommend staying on 63m prednisone daily and resume Esbriet 1 tab three times a day x 1 week; then 2 tabs three times a day x 1 week then 3 tabs three times a day. Sending in new order for oxygen, advised her to wear 3L oxygen continuously and get pulse oximeter and monitor O2 at home if sustaining <88% on oxygen OR symptoms worsen please call EMS for ED evaluation. Need follow-up in 1 week. Discussed with Dr. RChase Caller  and sent staff message to cardiology who will be scheduling her a visit.   Orders: - New oxygen start 3L/min [place] - Labs today (BNP and D-dimer) - Echocardiogram re: hypoxia, hx pericardial effusion - Needs to scheduled [place] re: ILD  Follow-up: - 1 week with MR or Beth NP   Martyn Ehrich, NP 06/13/2020

## 2020-06-07 NOTE — Patient Instructions (Addendum)
Recommendations: - Stay on 20mg  prednisone daily - Resume Esbriet 1 tab three times a day x 1 week; then 2 tabs three times a day x 1 week then 3 tabs three times a day  - Wear 3L oxygen continuously  - Get pulse oximeter and monitor O2 at home if sustaining <88% on oxygen OR symptoms worsen please call EMS for ED evaluation  Orders: - New oxygen start 3L continuously  - Labs today - Echocardiogram  - Needs to scheduled HRCT (already ordered)   Follow-up: - 1 week with MR or Beth NP - Needs visit with Dr. Elsworth Soho or Ander Slade for sleep consult     Home Oxygen Use, Adult When a medical condition keeps you from getting enough oxygen, your health care provider may instruct you to take extra oxygen at home. Your health care provider will let you know:  When to take oxygen.  For how long to take oxygen.  How quickly oxygen should be delivered (flow rate), in liters per minute (LPM or L/M). Home oxygen can be given through:  A mask.  A nasal cannula. This is a device or tube that goes in the nostrils.  A transtracheal catheter. This is a small, flexible tube placed in the trachea.  A tracheostomy. This is a surgically made opening in the trachea. These devices are connected with tubing to an oxygen source, such as:  A tank. Tanks hold oxygen in gas form. They must be replaced when the oxygen is used up.  A liquid oxygen device. This holds oxygen in liquid form. It must be replaced when the oxygen is used up.  An oxygen concentrator machine. This filters oxygen in the room. It uses electricity, so you must have a backup cylinder of oxygen in case the power goes out. Supplies needed: To use oxygen, you will need:  A mask, nasal cannula, transtracheal catheter, or tracheostomy.  An oxygen tank, a liquid oxygen device, or an oxygen concentrator.  The tape that your health care provider recommends (optional). If you use a transtracheal catheter and your prescribed flow rate is 1 LPM  or greater, you will also need a humidifier. Risks and complications  Fire. This can happen if the oxygen is exposed to a heat source, flame, or spark.  Injury to skin. This can happen if liquid oxygen touches your skin.  Organ damage. This can happen if you get too little oxygen. How to use oxygen Your health care provider or a representative from your [employer] will show you how to use your oxygen device. Follow her or his instructions. The instructions may look something like this: 1. Wash your hands. 2. If you use an oxygen concentrator, make sure it is plugged in. 3. Place one end of the tube into the port on the tank, device, or machine. 4. Place the mask over your nose and mouth. Or, place the nasal cannula and secure it with tape if instructed. If you use a tracheostomy or transtracheal catheter, connect it to the oxygen source as directed. 5. Make sure the liter-flow setting on the machine is at the level prescribed by your health care provider. 6. Turn on the machine or adjust the knob on the tank or device to the correct liter-flow setting. 7. When you are done, turn off and unplug the machine, or turn the knob to OFF. How to clean and care for the oxygen supplies Nasal cannula  Clean it with a warm, wet cloth daily or as needed.  Wash it with a liquid soap once a week.  Rinse it thoroughly once or twice a week.  Replace it every 2-4 weeks.  If you have an infection, such as a cold or pneumonia, change the cannula when you get better. Mask  Replace it every 2-4 weeks.  If you have an infection, such as a cold or pneumonia, change the mask when you get better. Humidifier bottle  Wash the bottle between each refill: ? Wash it with soap and warm water. ? Rinse it thoroughly. ? Disinfect it and its top. ? Air-dry it.  Make sure it is dry before you refill it. Oxygen concentrator  Clean the air filter at least twice a week according to directions from  your home medical equipment and service company.  Wipe down the cabinet every day. To do this: ? Unplug the unit. ? Wipe down the cabinet with a damp cloth. ? Dry the cabinet. Other equipment  Change any extra tubing every 1-3 months.  Follow instructions from your health care provider about taking care of any other equipment. Safety tips Fire safety tips   Keep your oxygen and oxygen supplies at least 5 ft away from sources of heat, flames, and sparks at all times.  Do not allow smoking near your oxygen. Put up "no smoking" signs in your home. Avoid smoking areas when in public.  Do not use materials that can burn (are flammable) while you use oxygen.  When you go to a restaurant with portable oxygen, ask to be seated in the nonsmoking section.  Keep a Data processing manager close by. Let your fire department know that you have oxygen in your home.  Test your home smoke detectors regularly. Traveling  Secure your oxygen tank in the vehicle so that it does not move around. Follow instructions from your medical device company about how to safely secure your tank.  Make sure you have enough oxygen for the amount of time you will be away from home.  If you are planning air travel, contact the airline to find out if they allow the use of an approved portable oxygen concentrator. You may also need documents from your health care provider and medical device company before you travel. General safety tips  If you use an oxygen cylinder, make sure it is in a stand or secured to an object that will not move (fixed object).  If you use liquid oxygen, make sure its container is kept upright.  If you use an oxygen concentrator: ? Dance movement psychotherapist company. Make sure you are given priority service in the event that your power goes out. ? Avoid using extension cords, if possible. Follow these instructions at home:  Use oxygen only as told by your health care provider.  Do not use alcohol  or other drugs that make you relax (sedating drugs) unless instructed. They can slow down your breathing rate and make it hard to get in enough oxygen.  Know how and when to order a refill of oxygen.  Always keep a spare tank of oxygen. Plan ahead for holidays when you may not be able to get a prescription filled.  Use water-based lubricants on your lips or nostrils. Do not use oil-based products like petroleum jelly.  To prevent skin irritation on your cheeks or behind your ears, tuck some gauze under the tubing. Contact a health care provider if:  You get headaches often.  You have shortness of breath.  You have a lasting cough.  You  have anxiety.  You are sleepy all the time.  You develop an illness that affects your breathing.  You cannot exercise at your regular level.  You are restless.  You have difficult or irregular breathing, and it is getting worse.  You have a fever.  You have persistent redness under your nose. Get help right away if:  You are confused.  You have blue lips or fingernails.  You are struggling to breathe. Summary  Your health care provider or a representative from your [employer] will show you how to use your oxygen device. Follow her or his instructions.  If you use an oxygen concentrator, make sure it is plugged in.  Make sure the liter-flow setting on the machine is at the level prescribed by your health care provider.  Keep your oxygen and oxygen supplies at least 5 ft away from sources of heat, flames, and sparks at all times. This information is not intended to replace advice given to you by your health care provider. Make sure you discuss any questions you have with your health care provider. Document Revised: 01/16/2018 Document Reviewed: 02/21/2016 Elsevier Patient Education  [place].

## 2020-06-08 ENCOUNTER — Other Ambulatory Visit: Payer: Self-pay | Admitting: Primary Care

## 2020-06-08 NOTE — Telephone Encounter (Signed)
I believe I already sent it in but if not yes we can refill

## 2020-06-08 NOTE — Telephone Encounter (Signed)
Pharmacy comment: THE DIRECTIONS AND QUANTITY DO NOT MATCH. PLEASE INDICATE THE DESIRED DIRECTIONS, QUANTITY, DAYS' SUPPLY.  THE DIRECTIONS AND QUANTITY DO NOT MATCH. PLEASE INDICATE THE DESIRED DIRECTIONS, QUANTITY, DAYS' SUPPLY.

## 2020-06-08 NOTE — Telephone Encounter (Signed)
Pt needs a refill on PIRFENIDONE (ESBRIET) 267 MG CAP next ov 06/14/2020

## 2020-06-08 NOTE — Telephone Encounter (Signed)
Yes I should have fixed it

## 2020-06-10 ENCOUNTER — Other Ambulatory Visit: Payer: Self-pay

## 2020-06-10 ENCOUNTER — Inpatient Hospital Stay (HOSPITAL_COMMUNITY): Admission: RE | Admit: 2020-06-10 | Payer: 59 | Source: Ambulatory Visit

## 2020-06-10 ENCOUNTER — Inpatient Hospital Stay (HOSPITAL_COMMUNITY)
Admission: EM | Admit: 2020-06-10 | Discharge: 2020-06-13 | DRG: 291 | Disposition: A | Discharge status: Home / Self Care | Payer: 59 | Attending: Student in an Organized Health Care Education/Training Program | Admitting: Student in an Organized Health Care Education/Training Program

## 2020-06-10 ENCOUNTER — Encounter (HOSPITAL_COMMUNITY): Payer: Self-pay | Admitting: Emergency Medicine

## 2020-06-10 ENCOUNTER — Inpatient Hospital Stay (HOSPITAL_COMMUNITY): Payer: 59

## 2020-06-10 ENCOUNTER — Emergency Department (HOSPITAL_COMMUNITY): Payer: 59

## 2020-06-10 DIAGNOSIS — Z6841 Body Mass Index (BMI) 40.0 and over, adult: Secondary | ICD-10-CM | POA: Diagnosis not present

## 2020-06-10 DIAGNOSIS — M329 Systemic lupus erythematosus, unspecified: Secondary | ICD-10-CM | POA: Diagnosis present

## 2020-06-10 DIAGNOSIS — Z882 Allergy status to sulfonamides status: Secondary | ICD-10-CM

## 2020-06-10 DIAGNOSIS — R059 Cough, unspecified: Secondary | ICD-10-CM | POA: Diagnosis present

## 2020-06-10 DIAGNOSIS — I480 Paroxysmal atrial fibrillation: Secondary | ICD-10-CM | POA: Diagnosis present

## 2020-06-10 DIAGNOSIS — Z833 Family history of diabetes mellitus: Secondary | ICD-10-CM

## 2020-06-10 DIAGNOSIS — E1165 Type 2 diabetes mellitus with hyperglycemia: Secondary | ICD-10-CM

## 2020-06-10 DIAGNOSIS — Z7901 Long term (current) use of anticoagulants: Secondary | ICD-10-CM | POA: Diagnosis not present

## 2020-06-10 DIAGNOSIS — D509 Iron deficiency anemia, unspecified: Secondary | ICD-10-CM | POA: Diagnosis present

## 2020-06-10 DIAGNOSIS — Z794 Long term (current) use of insulin: Secondary | ICD-10-CM | POA: Diagnosis not present

## 2020-06-10 DIAGNOSIS — Z8249 Family history of ischemic heart disease and other diseases of the circulatory system: Secondary | ICD-10-CM

## 2020-06-10 DIAGNOSIS — E119 Type 2 diabetes mellitus without complications: Secondary | ICD-10-CM | POA: Diagnosis not present

## 2020-06-10 DIAGNOSIS — I110 Hypertensive heart disease with heart failure: Secondary | ICD-10-CM | POA: Diagnosis present

## 2020-06-10 DIAGNOSIS — T380X5A Adverse effect of glucocorticoids and synthetic analogues, initial encounter: Secondary | ICD-10-CM | POA: Diagnosis present

## 2020-06-10 DIAGNOSIS — I10 Essential (primary) hypertension: Secondary | ICD-10-CM | POA: Diagnosis present

## 2020-06-10 DIAGNOSIS — I340 Nonrheumatic mitral (valve) insufficiency: Secondary | ICD-10-CM | POA: Diagnosis present

## 2020-06-10 DIAGNOSIS — Z20822 Contact with and (suspected) exposure to covid-19: Secondary | ICD-10-CM | POA: Diagnosis present

## 2020-06-10 DIAGNOSIS — Z79899 Other long term (current) drug therapy: Secondary | ICD-10-CM

## 2020-06-10 DIAGNOSIS — I509 Heart failure, unspecified: Secondary | ICD-10-CM

## 2020-06-10 DIAGNOSIS — J849 Interstitial pulmonary disease, unspecified: Secondary | ICD-10-CM | POA: Diagnosis present

## 2020-06-10 DIAGNOSIS — Z9981 Dependence on supplemental oxygen: Secondary | ICD-10-CM

## 2020-06-10 DIAGNOSIS — Z801 Family history of malignant neoplasm of trachea, bronchus and lung: Secondary | ICD-10-CM | POA: Diagnosis not present

## 2020-06-10 DIAGNOSIS — Z7982 Long term (current) use of aspirin: Secondary | ICD-10-CM

## 2020-06-10 DIAGNOSIS — J9611 Chronic respiratory failure with hypoxia: Secondary | ICD-10-CM | POA: Diagnosis present

## 2020-06-10 DIAGNOSIS — I313 Pericardial effusion (noninflammatory): Secondary | ICD-10-CM | POA: Diagnosis present

## 2020-06-10 DIAGNOSIS — I5033 Acute on chronic diastolic (congestive) heart failure: Secondary | ICD-10-CM | POA: Diagnosis present

## 2020-06-10 DIAGNOSIS — E876 Hypokalemia: Secondary | ICD-10-CM | POA: Diagnosis present

## 2020-06-10 DIAGNOSIS — I4891 Unspecified atrial fibrillation: Secondary | ICD-10-CM | POA: Diagnosis not present

## 2020-06-10 HISTORY — DX: Dyspnea, unspecified: R06.00

## 2020-06-10 LAB — BASIC METABOLIC PANEL
Anion gap: 13 (ref 5–15)
BUN: 11 mg/dL (ref 6–20)
CO2: 28 mmol/L (ref 22–32)
Calcium: 9 mg/dL (ref 8.9–10.3)
Chloride: 97 mmol/L — ABNORMAL LOW (ref 98–111)
Creatinine, Ser: 0.92 mg/dL (ref 0.44–1.00)
GFR, Estimated: >60 mL/min (ref >60)
Glucose, Bld: 289 mg/dL — ABNORMAL HIGH (ref 70–99)
Potassium: 2.8 mmol/L — ABNORMAL LOW (ref 3.5–5.1)
Sodium: 138 mmol/L (ref 135–145)

## 2020-06-10 LAB — GLUCOSE, CAPILLARY: Glucose-Capillary: 420 mg/dL — ABNORMAL HIGH (ref 70–99)

## 2020-06-10 LAB — ECHOCARDIOGRAM COMPLETE
AR max vel: 1.17 cm2
AV Area VTI: 1.14 cm2
AV Area mean vel: 1.13 cm2
AV Mean grad: 10.2 mmHg
AV Peak grad: 17.2 mmHg
Ao pk vel: 2.07 m/s
Height: 60 in
S' Lateral: 3 cm
Weight: 3712 oz

## 2020-06-10 LAB — CBC
HCT: 38 % (ref 36.0–46.0)
Hemoglobin: 11.6 g/dL — ABNORMAL LOW (ref 12.0–15.0)
MCH: 24.3 pg — ABNORMAL LOW (ref 26.0–34.0)
MCHC: 30.5 g/dL (ref 30.0–36.0)
MCV: 79.7 fL — ABNORMAL LOW (ref 80.0–100.0)
Platelets: 482 10*3/uL — ABNORMAL HIGH (ref 150–400)
RBC: 4.77 MIL/uL (ref 3.87–5.11)
RDW: 16.8 % — ABNORMAL HIGH (ref 11.5–15.5)
WBC: 9.6 10*3/uL (ref 4.0–10.5)
nRBC: 0.6 % — ABNORMAL HIGH (ref 0.0–0.2)

## 2020-06-10 LAB — MAGNESIUM: Magnesium: 1.4 mg/dL — ABNORMAL LOW (ref 1.7–2.4)

## 2020-06-10 LAB — I-STAT BETA HCG BLOOD, ED (MC, WL, AP ONLY): I-stat hCG, quantitative: <5 m[IU]/mL (ref <5)

## 2020-06-10 LAB — RESPIRATORY PANEL BY RT PCR (FLU A&B, COVID)
Influenza A by PCR: NEGATIVE
Influenza B by PCR: NEGATIVE
SARS Coronavirus 2 by RT PCR: NEGATIVE

## 2020-06-10 LAB — TROPONIN I (HIGH SENSITIVITY)
Troponin I (High Sensitivity): 24 ng/L — ABNORMAL HIGH (ref <18)
Troponin I (High Sensitivity): 20 ng/L — ABNORMAL HIGH (ref <18)

## 2020-06-10 LAB — CBG MONITORING, ED: Glucose-Capillary: 372 mg/dL — ABNORMAL HIGH (ref 70–99)

## 2020-06-10 LAB — BRAIN NATRIURETIC PEPTIDE: B Natriuretic Peptide: 210.1 pg/mL — ABNORMAL HIGH (ref 0.0–100.0)

## 2020-06-10 MED ORDER — ASPIRIN EC 81 MG PO TBEC
81.0000 mg | DELAYED_RELEASE_TABLET | Freq: Every day | ORAL | Status: DC
  Administered 2020-06-11 – 2020-06-13 (×3): 81 mg via ORAL
  Filled 2020-06-10 (×3): qty 1

## 2020-06-10 MED ORDER — POTASSIUM CHLORIDE CRYS ER 20 MEQ PO TBCR
40.0000 meq | EXTENDED_RELEASE_TABLET | Freq: Once | ORAL | Status: AC
  Administered 2020-06-10: 40 meq via ORAL
  Filled 2020-06-10: qty 2

## 2020-06-10 MED ORDER — PERFLUTREN LIPID MICROSPHERE
1.0000 mL | INTRAVENOUS | Status: AC | PRN
  Administered 2020-06-10: 3 mL via INTRAVENOUS
  Filled 2020-06-10: qty 10

## 2020-06-10 MED ORDER — ALBUTEROL SULFATE (2.5 MG/3ML) 0.083% IN NEBU: 5.0000 mg | INHALATION_SOLUTION | Freq: Once | RESPIRATORY_TRACT | Status: DC

## 2020-06-10 MED ORDER — INSULIN ASPART 100 UNIT/ML ~~LOC~~ SOLN
5.0000 [IU] | Freq: Once | SUBCUTANEOUS | Status: AC
  Administered 2020-06-10: 5 [IU] via SUBCUTANEOUS

## 2020-06-10 MED ORDER — METOPROLOL TARTRATE 50 MG PO TABS
50.0000 mg | ORAL_TABLET | Freq: Two times a day (BID) | ORAL | Status: DC
  Administered 2020-06-10: 50 mg via ORAL
  Filled 2020-06-10: qty 2

## 2020-06-10 MED ORDER — FUROSEMIDE 10 MG/ML IJ SOLN
40.0000 mg | Freq: Once | INTRAMUSCULAR | Status: AC
  Administered 2020-06-10: 40 mg via INTRAVENOUS
  Filled 2020-06-10: qty 4

## 2020-06-10 MED ORDER — GUAIFENESIN ER 600 MG PO TB12: 1200.0000 mg | ORAL_TABLET | Freq: Two times a day (BID) | ORAL | Status: DC | PRN

## 2020-06-10 MED ORDER — MAGNESIUM SULFATE 4 GM/100ML IV SOLN
4.0000 g | Freq: Once | INTRAVENOUS | Status: AC
  Administered 2020-06-10: 4 g via INTRAVENOUS
  Filled 2020-06-10 (×2): qty 100

## 2020-06-10 MED ORDER — SODIUM CHLORIDE 0.9% FLUSH: 3.0000 mL | INTRAVENOUS | Status: DC | PRN

## 2020-06-10 MED ORDER — AZATHIOPRINE 50 MG PO TABS
50.0000 mg | ORAL_TABLET | Freq: Two times a day (BID) | ORAL | Status: DC
  Administered 2020-06-10 – 2020-06-13 (×6): 50 mg via ORAL
  Filled 2020-06-10 (×7): qty 1

## 2020-06-10 MED ORDER — ACETAMINOPHEN 325 MG PO TABS: 650.0000 mg | ORAL_TABLET | ORAL | Status: DC | PRN

## 2020-06-10 MED ORDER — METOPROLOL TARTRATE 5 MG/5ML IV SOLN
5.0000 mg | Freq: Once | INTRAVENOUS | Status: AC
  Administered 2020-06-10: 5 mg via INTRAVENOUS
  Filled 2020-06-10: qty 5

## 2020-06-10 MED ORDER — DILTIAZEM HCL ER COATED BEADS 240 MG PO CP24
240.0000 mg | ORAL_CAPSULE | Freq: Every day | ORAL | Status: DC
  Administered 2020-06-11 – 2020-06-13 (×3): 240 mg via ORAL
  Filled 2020-06-10: qty 2
  Filled 2020-06-10: qty 1
  Filled 2020-06-10: qty 2
  Filled 2020-06-10 (×2): qty 1
  Filled 2020-06-10: qty 2

## 2020-06-10 MED ORDER — METOPROLOL TARTRATE 50 MG PO TABS
50.0000 mg | ORAL_TABLET | Freq: Three times a day (TID) | ORAL | Status: DC
  Administered 2020-06-10 – 2020-06-13 (×8): 50 mg via ORAL
  Filled 2020-06-10 (×8): qty 1

## 2020-06-10 MED ORDER — INSULIN ASPART 100 UNIT/ML ~~LOC~~ SOLN
0.0000 [IU] | Freq: Three times a day (TID) | SUBCUTANEOUS | Status: DC
  Administered 2020-06-10: 15 [IU] via SUBCUTANEOUS
  Administered 2020-06-11: 11 [IU] via SUBCUTANEOUS
  Administered 2020-06-11: 5 [IU] via SUBCUTANEOUS
  Administered 2020-06-12 (×2): 8 [IU] via SUBCUTANEOUS
  Administered 2020-06-12: 2 [IU] via SUBCUTANEOUS
  Administered 2020-06-13: 5 [IU] via SUBCUTANEOUS

## 2020-06-10 MED ORDER — INSULIN ASPART 100 UNIT/ML ~~LOC~~ SOLN
0.0000 [IU] | Freq: Every day | SUBCUTANEOUS | Status: DC
  Administered 2020-06-10: 5 [IU] via SUBCUTANEOUS
  Administered 2020-06-11: 4 [IU] via SUBCUTANEOUS
  Administered 2020-06-12: 2 [IU] via SUBCUTANEOUS

## 2020-06-10 MED ORDER — ALBUTEROL SULFATE HFA 108 (90 BASE) MCG/ACT IN AERS
2.0000 | INHALATION_SPRAY | Freq: Four times a day (QID) | RESPIRATORY_TRACT | Status: DC | PRN
  Filled 2020-06-10: qty 6.7

## 2020-06-10 MED ORDER — INSULIN ASPART 100 UNIT/ML ~~LOC~~ SOLN
4.0000 [IU] | Freq: Three times a day (TID) | SUBCUTANEOUS | Status: DC
  Administered 2020-06-10 – 2020-06-12 (×7): 4 [IU] via SUBCUTANEOUS

## 2020-06-10 MED ORDER — HYDROXYCHLOROQUINE SULFATE 200 MG PO TABS
200.0000 mg | ORAL_TABLET | Freq: Two times a day (BID) | ORAL | Status: DC
  Administered 2020-06-10 – 2020-06-13 (×6): 200 mg via ORAL
  Filled 2020-06-10 (×7): qty 1

## 2020-06-10 MED ORDER — INSULIN GLARGINE 100 UNIT/ML ~~LOC~~ SOLN
20.0000 [IU] | Freq: Every day | SUBCUTANEOUS | Status: DC
  Administered 2020-06-10 – 2020-06-12 (×3): 20 [IU] via SUBCUTANEOUS
  Filled 2020-06-10 (×4): qty 0.2

## 2020-06-10 MED ORDER — SODIUM CHLORIDE 0.9 % IV SOLN: 250.0000 mL | INTRAVENOUS | Status: DC | PRN

## 2020-06-10 MED ORDER — PREDNISONE 20 MG PO TABS
20.0000 mg | ORAL_TABLET | Freq: Every day | ORAL | Status: DC
  Administered 2020-06-11 – 2020-06-13 (×3): 20 mg via ORAL
  Filled 2020-06-10 (×3): qty 1

## 2020-06-10 MED ORDER — ONDANSETRON HCL 4 MG/2ML IJ SOLN: 4.0000 mg | Freq: Four times a day (QID) | INTRAMUSCULAR | Status: DC | PRN

## 2020-06-10 MED ORDER — SODIUM CHLORIDE 0.9% FLUSH
3.0000 mL | Freq: Two times a day (BID) | INTRAVENOUS | Status: DC
  Administered 2020-06-10 – 2020-06-13 (×7): 3 mL via INTRAVENOUS

## 2020-06-10 MED ORDER — APIXABAN 5 MG PO TABS
5.0000 mg | ORAL_TABLET | Freq: Two times a day (BID) | ORAL | Status: DC
  Administered 2020-06-10 – 2020-06-13 (×6): 5 mg via ORAL
  Filled 2020-06-10 (×7): qty 1

## 2020-06-10 MED FILL — ?HUMALOG 100 UNITS/ML KWIKP: 100 | 25 days supply | Qty: 3 | Fill #2

## 2020-06-10 NOTE — ED Triage Notes (Signed)
Pt here with irregular heart rate. Went to dr on Tuesday and they put her on o2 3L . Today her feet are swelling so she decided to come in. HX of afib on eliquis, CHF, DM on prednisone. HR. In triage was in the 140s afib

## 2020-06-10 NOTE — H&P (Signed)
Date: 06/10/2020               Patient Name:  [name] MRN: [id number]  DOB: [birth year] Age / Sex: 44 y.o., female   PCP: Gildardo Pounds, NP         Medical Service: Internal Medicine Teaching Service         Attending Physician: Dr. Oda Kilts, MD    First Contact: Dr. Candie Chroman Pager: [phone]  Second Contact: Dr. Court Joy Pager: [phone]       After Hours (After 5p/  First Contact Pager: [phone]  weekends / holidays): Second Contact Pager: [phone]   Chief Complaint: Shortness of breath  History of Present Illness: [name] is a 44 year old female with a medical history including but not limited to hypertension, type 2 diabetes mellitus,SLE (on Plaquenil Imuran and prednisone 20 mg), paroxysmal atrial fibrillation (on Eliquis, diltiazem, metoprolol),chronic diastolic heart failure (EF 55-60% in 10/2019),  moderate mitral regurgitation , and chronic respiratory failure on 3 L supplemental oxygen at home who presents with increased shortness of breath.  Her shortness of breath has gradually been worsening over the last 2 weeks.  She had an office visit with pulmonology this week and reports and echo was ordered. She desatted to 75% on room air this visit and improved to 96% on 3L.  The echo was scheduled for today, but patient said she was feeling more short of breath and decided to come to the emergency room.  Her lower extremity swelling has also increased.  She was also recently started on prednisone for her SLE and believes she has been improving over the past week.  She has had some loose bowel movements and muscle aches this week.  Endorses orthopnea and PND. Denies any vision changes, chest pain, focal weakness, abdominal pain, dysuria, or nausea/vomiting.  ,  Meds:  Current Meds  Medication Sig   acetaminophen (TYLENOL) 500 MG tablet Take 1,000 mg by mouth every 6 (six) hours as needed for headache (pain).   albuterol (VENTOLIN HFA) 108 (90 Base)  MCG/ACT inhaler Inhale 2 puffs into the lungs every 6 (six) hours as needed for wheezing or shortness of breath.   aspirin EC 81 MG tablet Take 1 tablet (81 mg total) by mouth daily. Swallow whole.   azaTHIOprine (IMURAN) 50 MG tablet Take 50 mg by mouth 2 (two) times daily.   diltiazem (CARDIZEM CD) 240 MG 24 hr capsule TAKE 1 CAPSULE BY MOUTH EVERY DAY (Patient taking differently: Take 240 mg by mouth daily. )   Dulaglutide (TRULICITY) 1.82 XH/3.7JI SOPN Inject 0.5 mLs (0.75 mg total) into the skin once a week. (Patient taking differently: Inject 0.75 mg into the skin every Thursday. )   ELIQUIS 5 MG TABS tablet TAKE 1 TABLET TWICE A DAY (Patient taking differently: Take 5 mg by mouth 2 (two) times daily. )   furosemide (LASIX) 40 MG tablet Take 40 mg by mouth 2 (two) times daily.    guaiFENesin (MUCINEX) 600 MG 12 hr tablet Take 1,200 mg by mouth every 12 (twelve) hours as needed for cough or to loosen phlegm.   hydroxychloroquine (PLAQUENIL) 200 MG tablet Take 1 tablet (200 mg total) by mouth 2 (two) times daily.   Insulin Glargine (BASAGLAR KWIKPEN) 100 UNIT/ML Inject 0.2 mLs (20 Units total) into the skin daily. E11.65. may increase by 2 units every 3 days for fasting blood glucose readings greater than 130. (Patient taking differently: Inject 20 Units into  the skin at bedtime. )   insulin lispro (HUMALOG KWIKPEN) 100 UNIT/ML KwikPen For blood sugars 0-150 give 0 units of insulin, 151-200 give 2 units of insulin, 201-250 give 4 units, 251-300 give 6 units, 301-350 give 8 units, 351-400 give 10 units,> 400 give 12 units and call office. Discussed hypoglycemia protocol. (Patient taking differently: Inject 2-10 Units into the skin See admin instructions. Inject 2-10 units subcutaneously three times daily with meals per sliding scale: CBG 0-150  0 units of insulin, 151-200 inject 2 units of insulin, 201-250  4 units, 251-300  6 units, 301-350  8 units, 351-400  10 units,> 400 12 units and call  office. Discussed hypoglycemia protocol.)   losartan (COZAAR) 50 MG tablet TAKE 1 TABLET BY MOUTH EVERY DAY (Patient taking differently: Take 50 mg by mouth daily. )   medroxyPROGESTERone (DEPO-PROVERA) 150 MG/ML injection Inject 150 mg into the muscle every 3 (three) months.   metoprolol tartrate (LOPRESSOR) 50 MG tablet Take 1 tablet (50 mg total) by mouth 2 (two) times daily.   Phenyleph-CPM-DM-Aspirin (ALKA-SELTZER PLUS COLD & COUGH PO) Take 2 tablets by mouth every 4 (four) hours as needed (cough/congestion).    predniSONE (DELTASONE) 20 MG tablet Take 1 tablet (20 mg total) by mouth daily with breakfast.   rosuvastatin (CRESTOR) 20 MG tablet TAKE 1 TABLET BY MOUTH EVERY DAY AT 6PM (Patient taking differently: Take 20 mg by mouth daily at 6 PM. TAKE 1 TABLET BY MOUTH EVERY DAY AT 6PM)     Allergies: Allergies as of 06/10/2020 - Review Complete 06/10/2020  Allergen Reaction Noted   Septra [bactrim] Hives 03/07/2011   Sulfamethoxazole-trimethoprim Hives 01/26/2004   Past Medical History:  Diagnosis Date   Abnormal Pap smear of cervix    colpo, HPV   Allergy    Anemia    Atrial fibrillation (HCC)    CHF (congestive heart failure) (HCC)    Depression    after losses   Enlarged heart    managed by cardiology   Fibroid    Gestational diabetes    Heart murmur    Hypertension    Infection    UTI   Lupus ([place]) 1999   Pericarditis     Family History:  Family History  Problem Relation Age of Onset   Cancer Maternal Grandfather        lung   Diabetes Mother    Hypertension Mother    Hypertension Maternal Grandmother    Diabetes Father    Hearing loss Neg Hx    Social History:  Social History   Tobacco Use   Smoking status: Never Smoker   Smokeless tobacco: Never Used  Scientific laboratory technician Use: Never used  Substance Use Topics   Alcohol use: Yes    Comment: ocasionally    Drug use: No    Review of Systems: A complete ROS was  negative except as per HPI.   Physical Exam: Blood pressure (!) 129/97, pulse 66, temperature 98.5 F (36.9 C), temperature source Oral, resp. rate 17, height 5' (1.524 m), weight 105.2 kg, SpO2 92 %.  General: pleasant female sitting up in bed, no acute distress HE: Normocephalic, atraumatic , EOMI, Conjunctivae normal ENT: No congestion, no rhinorrhea, no exudate or erythema  Cardiovascular:  Irregularly irregular pulse.  Tachycardic, no murmurs, rubs, or gallops, JVP to ear Pulmonary : Effort normal, breath sounds normal. No wheezes, rales, or rhonchi Abdominal: soft, nontender,  bowel sounds present Musculoskeletal: Anasarca,  2+ pitting edema  in lower extremities Skin: Malar rash, erythema macular rash on hands and feet Psychiatric/Behavioral:  normal mood, normal behavior   EKG: personally reviewed my interpretation is atrial fibrillation  CXR: personally reviewed my interpretation is large cardiac silhouette , agree with official read this is concerning with recent history of pericardial effusion.  Bilateral airspace opacities.  Assessment & Plan by Problem: Active Problems:   Acute heart failure ([place])  [name] is a 44 year old female with a medical history including but not limited to hypertension, type 2 diabetes mellitus,SLE (on Plaquenil Imuran and prednisone 20 mg), paroxysmal atrial fibrillation (on Eliquis, diltiazem, metoprolol),chronic diastolic heart failure (EF 55-60% in 10/2019),  moderate mitral regurgitation , and chronic respiratory failure on 3 L supplemental oxygen at home who presents with increased shortness of breath.  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  She has diastolic heart failure and atrial fibrillation.  She ran out of metoprolol in the last few days and presents in atrial fibrillation with RVR.  Her lower  extremity swelling acutely worsened overnight, but reports the shortness of breath and swelling has been progressive over the last few weeks.  Elevated BNP , pulmonary congestion on x-ray, and volume overloaded on exam.  Heart failure exacerbation likely secondary to running out of medication to control A. fib.  Patient has a history of pericardial effusion will better evaluate with transthoracic echo. . Patient has a history of PCI to LAD. High-sensitivity troponin flat. -Strict ins and outs -Daily weights -IV Lasix 40 mg BID - 40 mg twice daily - Follow-up TTE  Paroxysmal atrial fibrillation EKG shows atrial fibrillation with RVR.  Chart review shows patient has had ablation and failed in the past.  She is on metoprolol and diltiazem as outpatient.  Recently ran out of metoprolol a day ago.  Received  IV metoprolol in the ED with heart rate improved to 100-120s from 140s.  Patient reports she took her diltiazem today, will restart metoprolol tartrate for rate control.  Patient reports taking 100 mg metoprolol tartrate twice daily, med rec says 50 mg twice daily.  - Continue Diltiazem  -Lopressor 50 mg once, will monitor continue Lopressor 50 mg 3 times daily if rate controlled - Diuresis as above  -Eliquis 5 mg twice daily  Hypomagnesia  Hypokalemia  Replete Mg and K  - Follow BMP in 6 hours  SLE -Continue home medications Plaquenil, Imuran, prednisone 20 mg  Mild microcytic anemia -Monitor hemoglobin, 11.6  Type 2 diabetes mellitus with hyperglycemia -Home regimen of Trulicity 3.50 mg, 20 units glargine, sliding scale lispro. -Patient reports blood glucose has been elevated since starting prednisone - hemoglobin A1c 11.1 04/19/2020 -Basal 20 units, SSI-m, 4 units w/ meals, QHS correction scale   Dispo: Admit patient to Inpatient with expected length of stay greater than 2 midnights.  Signed: Madalyn Rob, MD 06/10/2020, 2:03 PM  After 5pm on weekdays and 1pm on weekends: On Call  pager: [phone]

## 2020-06-10 NOTE — Progress Notes (Signed)
CRITICAL VALUE ALERT  Critical Value:  CBG 420  Date & Time Notied: 06/10/20 2110  Provider Notified: Dr Bridgett Larsson   Orders Received/Actions taken: 5 units of Novolog ordered

## 2020-06-10 NOTE — Progress Notes (Signed)
  Echocardiogram 2D Echocardiogram has been performed with Definity  [name] 06/10/2020, 3:34 PM

## 2020-06-10 NOTE — ED Notes (Signed)
Pharmacy and Xray at bedside

## 2020-06-10 NOTE — ED Provider Notes (Signed)
[facility] EMERGENCY DEPARTMENT Provider Note   CSN: [id number] Arrival date & time: 06/10/20  5277     History Chief Complaint  Patient presents with  . Atrial Fibrillation    [name] is a 44 y.o. female.  The history is provided by the patient.  Shortness of Breath Severity:  Moderate Onset quality:  Gradual Duration:  3 days Timing:  Constant Progression:  Worsening Chronicity:  New Relieved by:  Oxygen and rest Worsened by:  Exertion Associated symptoms: no abdominal pain, no chest pain, no cough, no ear pain, no fever, no rash, no sore throat and no vomiting        Past Medical History:  Diagnosis Date  . Abnormal Pap smear of cervix    colpo, HPV  . Allergy   . Anemia   . Atrial fibrillation ([place])   . CHF (congestive heart failure) ([place])   . Depression    after losses  . Enlarged heart    managed by cardiology  . Fibroid   . Gestational diabetes   . Heart murmur   . Hypertension   . Infection    UTI  . Lupus ([place]) 1999  . Pericarditis     Patient Active Problem List   Diagnosis Date Noted  . Acute heart failure ([place]) 06/10/2020  . ILD (interstitial lung disease) ([place]) 01/12/2019  . Coronary artery disease 12/12/2018  . Nonrheumatic aortic valve stenosis 11/19/2018  . Postinflammatory pulmonary fibrosis ([place]) 09/25/2018  . Hypokalemia 09/17/2018  . AKI (acute kidney injury) ([place]) 08/25/2018  . Encounter for annual routine gynecological examination 07/09/2018  . Abnormal vaginal Pap smear 07/09/2018  . Depo-Provera contraceptive status 07/09/2018  . Paroxysmal atrial fibrillation ([place]) 05/12/2018  . Atrial flutter ([place]) 05/12/2018  . Pericardial effusion 01/16/2018  . Chronic diastolic CHF (congestive heart failure) ([place]) 01/16/2018  . SOB (shortness of breath) 12/25/2017  . Hypertension 12/25/2017  . Microcytic anemia 12/25/2017  . Type 2 diabetes mellitus with hyperglycemia, without long-term current use of  insulin ([place]) 12/25/2017  . Chronic pain of both knees 04/23/2017  . Fibroids 01/20/2015  . Menorrhagia 01/20/2015  . Low grade squamous intraepithelial lesion (LGSIL) on cervical Pap smear on 01/20/15 01/20/2015  . Atypical glandular cells on cervical Pap smear on 01/20/15 01/20/2015  . Lupus ([place]) 07/15/2013    Past Surgical History:  Procedure Laterality Date  . A-FLUTTER ABLATION N/A 06/10/2018   Procedure: A-FLUTTER ABLATION;  Surgeon: Evans Lance, MD;  Location: [place] [facility];  Service: Cardiovascular;  Laterality: N/A;  . CARDIAC CATHETERIZATION    . CORONARY STENT INTERVENTION N/A 12/12/2018   Procedure: CORONARY STENT INTERVENTION;  Surgeon: [name], MD;  Location: [place] [facility];  Service: Cardiovascular;  Laterality: N/A;  lad   . RIGHT/LEFT HEART CATH AND CORONARY ANGIOGRAPHY N/A 03/14/2018   Procedure: RIGHT/LEFT HEART CATH AND CORONARY ANGIOGRAPHY;  Surgeon: [name], MD;  Location: [place] [facility];  Service: Cardiovascular;  Laterality: N/A;  . RIGHT/LEFT HEART CATH AND CORONARY ANGIOGRAPHY N/A 12/12/2018   Procedure: RIGHT/LEFT HEART CATH AND CORONARY ANGIOGRAPHY;  Surgeon: [name], MD;  Location: [place] [facility];  Service: Cardiovascular;  Laterality: N/A;  . THERAPEUTIC ABORTION       OB History    Gravida  5   Para  2   Term  0   Preterm  2   AB  3   Living  2     SAB  2   TAB  1   Ectopic  0   Multiple  0   Live Births  2           Family History  Problem Relation Age of Onset  . Cancer Maternal Grandfather        lung  . Diabetes Mother   . Hypertension Mother   . Hypertension Maternal Grandmother   . Diabetes Father   . Hearing loss Neg Hx     Social History   Tobacco Use  . Smoking status: Never Smoker  . Smokeless tobacco: Never Used  Vaping Use  . Vaping Use: Never used  Substance Use Topics  . Alcohol use: Yes    Comment: ocasionally   . Drug use: No    Home  Medications Prior to Admission medications   Medication Sig Start Date End Date Taking? Authorizing Provider  acetaminophen (TYLENOL) 500 MG tablet Take 1,000 mg by mouth every 6 (six) hours as needed for headache (pain).   Yes [provider]  albuterol (VENTOLIN HFA) 108 (90 Base) MCG/ACT inhaler Inhale 2 puffs into the lungs every 6 (six) hours as needed for wheezing or shortness of breath. 05/24/20  Yes Brand Males, MD  aspirin EC 81 MG tablet Take 1 tablet (81 mg total) by mouth daily. Swallow whole. 04/22/20  Yes Patwardhan, Reynold Bowen, MD  azaTHIOprine (IMURAN) 50 MG tablet Take 50 mg by mouth 2 (two) times daily.   Yes [provider]  diltiazem (CARDIZEM CD) 240 MG 24 hr capsule TAKE 1 CAPSULE BY MOUTH EVERY DAY Patient taking differently: Take 240 mg by mouth daily.  02/01/20  Yes Patwardhan, Manish J, MD  Dulaglutide (TRULICITY) 1.75 ZW/2.5EN SOPN Inject 0.5 mLs (0.75 mg total) into the skin once a week. Patient taking differently: Inject 0.75 mg into the skin every Thursday.  04/19/20 07/18/20 Yes Gildardo Pounds, NP  ELIQUIS 5 MG TABS tablet TAKE 1 TABLET TWICE A DAY Patient taking differently: Take 5 mg by mouth 2 (two) times daily.  09/11/19  Yes Patwardhan, Manish J, MD  furosemide (LASIX) 40 MG tablet Take 40 mg by mouth 2 (two) times daily.  09/15/19  Yes [provider]  guaiFENesin (MUCINEX) 600 MG 12 hr tablet Take 1,200 mg by mouth every 12 (twelve) hours as needed for cough or to loosen phlegm.   Yes [provider]  hydroxychloroquine (PLAQUENIL) 200 MG tablet Take 1 tablet (200 mg total) by mouth 2 (two) times daily. 05/13/20  Yes Brand Males, MD  Insulin Glargine (BASAGLAR KWIKPEN) 100 UNIT/ML Inject 0.2 mLs (20 Units total) into the skin daily. E11.65. may increase by 2 units every 3 days for fasting blood glucose readings greater than 130. Patient taking differently: Inject 20 Units into the skin at bedtime.  04/19/20 07/18/20 Yes  Gildardo Pounds, NP  insulin lispro (HUMALOG KWIKPEN) 100 UNIT/ML KwikPen For blood sugars 0-150 give 0 units of insulin, 151-200 give 2 units of insulin, 201-250 give 4 units, 251-300 give 6 units, 301-350 give 8 units, 351-400 give 10 units,> 400 give 12 units and call office. Discussed hypoglycemia protocol. Patient taking differently: Inject 2-10 Units into the skin See admin instructions. Inject 2-10 units subcutaneously three times daily with meals per sliding scale: CBG 0-150  0 units of insulin, 151-200 inject 2 units of insulin, 201-250  4 units, 251-300  6 units, 301-350  8 units, 351-400  10 units,> 400 12 units and call office. Discussed hypoglycemia protocol.  04/19/20  Yes Gildardo Pounds, NP  losartan (COZAAR) 50 MG tablet TAKE 1 TABLET BY MOUTH EVERY DAY Patient taking differently: Take 50 mg by mouth daily.  02/19/20  Yes Adrian Prows, MD  medroxyPROGESTERone (DEPO-PROVERA) 150 MG/ML injection Inject 150 mg into the muscle every 3 (three) months.   Yes [provider]  metoprolol tartrate (LOPRESSOR) 50 MG tablet Take 1 tablet (50 mg total) by mouth 2 (two) times daily. 04/04/20  Yes Patwardhan, Reynold Bowen, MD  Phenyleph-CPM-DM-Aspirin (ALKA-SELTZER PLUS COLD & COUGH PO) Take 2 tablets by mouth every 4 (four) hours as needed (cough/congestion).    Yes [provider]  predniSONE (DELTASONE) 20 MG tablet Take 1 tablet (20 mg total) by mouth daily with breakfast. 06/07/20  Yes Martyn Ehrich, NP  rosuvastatin (CRESTOR) 20 MG tablet TAKE 1 TABLET BY MOUTH EVERY DAY AT 6PM Patient taking differently: Take 20 mg by mouth daily at 6 PM. TAKE 1 TABLET BY MOUTH EVERY DAY AT 6PM 04/19/20  Yes Gildardo Pounds, NP  Blood Glucose Monitoring Suppl (ONETOUCH VERIO) w/Device KIT 1 each by Does not apply route 3 (three) times daily. 06/25/19   Gildardo Pounds, NP  colchicine 0.6 MG tablet Take 1 tablet (0.6 mg total) by mouth daily. Patient not taking: Reported on 06/10/2020 12/16/18    [name], MD  Continuous Blood Gluc Receiver (FREESTYLE LIBRE 2 READER) DEVI Use as instructed. Check blood glucose levels five times per day per day. E11.65 Z91.89 04/19/20   Gildardo Pounds, NP  Continuous Blood Gluc Sensor (FREESTYLE LIBRE 2 SENSOR) MISC Use as instructed. Check blood glucose levels 5 times per day. E11.65 Z91.89 04/19/20   Gildardo Pounds, NP  ESBRIET 267 MG CAPS Take 1 capsule by mouth in the morning, at noon, and at bedtime for 7 days, THEN 2 capsules in the morning, at noon, and at bedtime for 7 days, THEN 3 capsules in the morning, at noon, and at bedtime for 14 days. 06/08/20 07/06/20  Martyn Ehrich, NP  glucose blood (ACCU-CHEK GUIDE) test strip Use as instructed to check blood sugar twice daily. E11.65 04/27/20   Charlott Rakes, MD  Insulin Pen Needle (TRUEPLUS PEN NEEDLES) 32G X 4 MM MISC Use as directed to inject insulin 5x daily 04/19/20   Gildardo Pounds, NP  Lancet Devices (ONE TOUCH DELICA LANCING DEV) MISC 1 each by Does not apply route 3 (three) times daily. 06/25/19   Gildardo Pounds, NP  OneTouch Delica Lancets 85I MISC 1 each by Does not apply route 3 (three) times daily. 06/25/19   Gildardo Pounds, NP  insulin aspart (NOVOLOG FLEXPEN) 100 UNIT/ML FlexPen For blood sugars 0-150 give 0 units of insulin, 151-200 give 2 units of insulin, 201-250 give 4 units, 251-300 give 6 units, 301-350 give 8 units, 351-400 give 10 units,> 400 give 12 units and call M.D. Discussed hypoglycemia protocol. 03/25/20 03/25/20  Gildardo Pounds, NP    Allergies    Septra [bactrim] and Sulfamethoxazole-trimethoprim  Review of Systems   Review of Systems  Constitutional: Positive for fatigue. Negative for chills and fever.  HENT: Negative for ear pain and sore throat.   Eyes: Negative for pain and visual disturbance.  Respiratory: Positive for shortness of breath. Negative for cough.   Cardiovascular: Positive for leg swelling. Negative for chest pain and palpitations.   Gastrointestinal: Negative for abdominal pain and vomiting.  Genitourinary: Negative for dysuria and hematuria.  Musculoskeletal: Negative for arthralgias  and back pain.  Skin: Negative for color change and rash.  Neurological: Negative for seizures and syncope.  All other systems reviewed and are negative.   Physical Exam Updated Vital Signs BP (!) 129/97   Pulse 66   Temp 98.5 F (36.9 C) (Oral)   Resp 17   Ht 5' (1.524 m)   Wt 105.2 kg   SpO2 92%   BMI 45.31 kg/m   Physical Exam Vitals and nursing note reviewed.  Constitutional:      Appearance: She is well-developed. She is obese. She is not ill-appearing, toxic-appearing or diaphoretic.  HENT:     Head: Normocephalic and atraumatic.     Mouth/Throat:     Mouth: Mucous membranes are moist.     Pharynx: Oropharynx is clear.  Eyes:     Conjunctiva/sclera: Conjunctivae normal.  Cardiovascular:     Rate and Rhythm: Normal rate and regular rhythm.     Pulses:          Radial pulses are 2+ on the right side and 2+ on the left side.       Dorsalis pedis pulses are 2+ on the right side and 2+ on the left side.     Heart sounds: No murmur heard.  No gallop.   Pulmonary:     Effort: Pulmonary effort is normal. Tachypnea present. No respiratory distress.     Breath sounds: Examination of the right-lower field reveals rales. Examination of the left-lower field reveals rales. Rales present.  Abdominal:     Palpations: Abdomen is soft.     Tenderness: There is no abdominal tenderness.  Musculoskeletal:     Cervical back: Neck supple.     Right lower leg: 2+ Pitting Edema present.     Left lower leg: 2+ Pitting Edema present.  Skin:    General: Skin is warm and dry.  Neurological:     General: No focal deficit present.     Mental Status: She is alert.     ED Results / Procedures / Treatments   Labs (all labs ordered are listed, but only abnormal results are displayed) Labs Reviewed  BASIC METABOLIC PANEL -  Abnormal; Notable for the following components:      Result Value   Potassium 2.8 (*)    Chloride 97 (*)    Glucose, Bld 289 (*)    All other components within normal limits  CBC - Abnormal; Notable for the following components:   Hemoglobin 11.6 (*)    MCV 79.7 (*)    MCH 24.3 (*)    RDW 16.8 (*)    Platelets 482 (*)    nRBC 0.6 (*)    All other components within normal limits  BRAIN NATRIURETIC PEPTIDE - Abnormal; Notable for the following components:   B Natriuretic Peptide 210.1 (*)    All other components within normal limits  MAGNESIUM - Abnormal; Notable for the following components:   Magnesium 1.4 (*)    All other components within normal limits  TROPONIN I (HIGH SENSITIVITY) - Abnormal; Notable for the following components:   Troponin I (High Sensitivity) 24 (*)    All other components within normal limits  RESPIRATORY PANEL BY RT PCR (FLU A&B, COVID)  HIV ANTIBODY (ROUTINE TESTING W REFLEX)  I-STAT BETA HCG BLOOD, ED ([facility], [facility], [facility] ONLY)  TROPONIN I (HIGH SENSITIVITY)    EKG EKG Interpretation  Date/Time:  Friday June 10 2020 09:44:49 EDT Ventricular Rate:  149 PR Interval:    QRS  Duration: 90 QT Interval:  322 QTC Calculation: 507 R Axis:   179 Text Interpretation: Atrial fibrillation with rapid ventricular response Right axis deviation Possible Right ventricular hypertrophy Abnormal ECG Confirmed by Elnora Morrison [phone]) on 06/10/2020 10:50:02 AM   Radiology DG Chest Portable 1 View  Result Date: 06/10/2020 CLINICAL DATA:  Shortness of breath. History AFib, CHF, diabetes, hypertension, enlarged heart, lupus, pericarditis. EXAM: PORTABLE CHEST 1 VIEW COMPARISON:  04/05/2020 FINDINGS: Similar enlarged cardiac silhouette. Mild diffuse interstitial thickening and hazy airspace opacities. More dense left basilar opacity. No visible pleural effusions or pneumothorax. No acute osseous abnormality. IMPRESSION: 1. Mild diffuse interstitial thickening and hazy  airspace opacities, suggestive of mild pulmonary edema. 2. More dense left basilar consolidation may represent atelectasis and/or pneumonia. 3. Similar enlarged cardiac silhouette, which may represent cardiomegaly and/or pericardial effusion in this patient with a history of pericarditis. Electronically Signed   By: Margaretha Sheffield MD   On: 06/10/2020 11:07    Procedures Procedures (including critical care time)  Medications Ordered in ED Medications  albuterol (PROVENTIL) (2.5 MG/3ML) 0.083% nebulizer solution 5 mg (5 mg Nebulization Not Given 06/10/20 1321)  aspirin EC tablet 81 mg (has no administration in time range)  hydroxychloroquine (PLAQUENIL) tablet 200 mg (has no administration in time range)  predniSONE (DELTASONE) tablet 20 mg (has no administration in time range)  apixaban (ELIQUIS) tablet 5 mg (has no administration in time range)  azaTHIOprine (IMURAN) tablet 50 mg (has no administration in time range)  albuterol (VENTOLIN HFA) 108 (90 Base) MCG/ACT inhaler 2 puff (has no administration in time range)  guaiFENesin (MUCINEX) 12 hr tablet 1,200 mg (has no administration in time range)  sodium chloride flush (NS) 0.9 % injection 3 mL (has no administration in time range)  sodium chloride flush (NS) 0.9 % injection 3 mL (has no administration in time range)  0.9 %  sodium chloride infusion (has no administration in time range)  acetaminophen (TYLENOL) tablet 650 mg (has no administration in time range)  ondansetron (ZOFRAN) injection 4 mg (has no administration in time range)  metoprolol tartrate (LOPRESSOR) tablet 50 mg (has no administration in time range)  metoprolol tartrate (LOPRESSOR) injection 5 mg (5 mg Intravenous Given 06/10/20 1051)  potassium chloride SA (KLOR-CON) CR tablet 40 mEq (40 mEq Oral Given 06/10/20 1308)  furosemide (LASIX) injection 40 mg (40 mg Intravenous Given 06/10/20 1311)    ED Course  I have reviewed the triage vital signs and the nursing  notes.  Pertinent labs & imaging results that were available during my care of the patient were reviewed by me and considered in my medical decision making (see chart for details).    MDM Rules/Calculators/A&P                          The patient is a 44yo female, PMH CHF, afib on Eliquis, DM, HTN who presents to the ED via EMS for SOB.  On my initial evaluation, the patient is in afib RVR. Physical exam remarkable for bibasilar rales, irregular tachycardic rate, BLE edema.  Differentials considered include CHF exacerbation, A. fib RVR, electrolyte abnormality. I am most concerned for CHF exacerbation triggering A. fib RVR. EKG with A. fib RVR.   Patient provided IV metoprolol and Lasix for A. fib RVR and CHF exacerbation. Labs remarkable for hypokalemia, so provided p.o. potassium repletion.  Chest x-ray remarkable for pulmonary edema as interpreted by myself and radiology.  On reevaluation after IV  metoprolol, patient's heart rate improved from around the 140s to 100-120s, and she continues to be hemodynamically stable.  Patient still on 3L O2 [place].  Due to relatively recent initiation of oxygen requirement, A. fib RVR, and concern for CHF exacerbation, feel patient needs to be admitted for further management.  Patient admitted to unassigned service for further management of CHF exacerbation and A. fib RVR.  The care of this patient was overseen by Dr. Reather Converse, who agreed with evaluation and plan of care.   Final Clinical Impression(s) / ED Diagnoses Final diagnoses:  Atrial fibrillation with RVR ([place])  Acute on chronic congestive heart failure, unspecified heart failure type [facility])    Rx / DC Orders ED Discharge Orders    None       Launa Flight, MD 06/10/20 1357    Elnora Morrison, MD 06/10/20 1606

## 2020-06-11 ENCOUNTER — Other Ambulatory Visit: Payer: Self-pay

## 2020-06-11 ENCOUNTER — Encounter (HOSPITAL_COMMUNITY): Payer: Self-pay | Admitting: Internal Medicine

## 2020-06-11 DIAGNOSIS — E1165 Type 2 diabetes mellitus with hyperglycemia: Secondary | ICD-10-CM

## 2020-06-11 DIAGNOSIS — J849 Interstitial pulmonary disease, unspecified: Secondary | ICD-10-CM

## 2020-06-11 DIAGNOSIS — I5033 Acute on chronic diastolic (congestive) heart failure: Secondary | ICD-10-CM

## 2020-06-11 LAB — CBC WITH DIFFERENTIAL/PLATELET
Abs Immature Granulocytes: 0.04 10*3/uL (ref 0.00–0.07)
Basophils Absolute: 0 10*3/uL (ref 0.0–0.1)
Basophils Relative: 0 %
Eosinophils Absolute: 0 10*3/uL (ref 0.0–0.5)
Eosinophils Relative: 1 %
HCT: 39.2 % (ref 36.0–46.0)
Hemoglobin: 11.9 g/dL — ABNORMAL LOW (ref 12.0–15.0)
Immature Granulocytes: 1 %
Lymphocytes Relative: 15 %
Lymphs Abs: 1.1 10*3/uL (ref 0.7–4.0)
MCH: 23.9 pg — ABNORMAL LOW (ref 26.0–34.0)
MCHC: 30.4 g/dL (ref 30.0–36.0)
MCV: 78.7 fL — ABNORMAL LOW (ref 80.0–100.0)
Monocytes Absolute: 0.6 10*3/uL (ref 0.1–1.0)
Monocytes Relative: 8 %
Neutro Abs: 5.8 10*3/uL (ref 1.7–7.7)
Neutrophils Relative %: 75 %
Platelets: 493 10*3/uL — ABNORMAL HIGH (ref 150–400)
RBC: 4.98 MIL/uL (ref 3.87–5.11)
RDW: 16.8 % — ABNORMAL HIGH (ref 11.5–15.5)
WBC: 7.7 10*3/uL (ref 4.0–10.5)
nRBC: 0.5 % — ABNORMAL HIGH (ref 0.0–0.2)

## 2020-06-11 LAB — GLUCOSE, CAPILLARY
Glucose-Capillary: 130 mg/dL — ABNORMAL HIGH (ref 70–99)
Glucose-Capillary: 112 mg/dL — ABNORMAL HIGH (ref 70–99)
Glucose-Capillary: 207 mg/dL — ABNORMAL HIGH (ref 70–99)
Glucose-Capillary: 339 mg/dL — ABNORMAL HIGH (ref 70–99)
Glucose-Capillary: 316 mg/dL — ABNORMAL HIGH (ref 70–99)

## 2020-06-11 LAB — HEMOGLOBIN A1C
Hgb A1c MFr Bld: 11.8 % — ABNORMAL HIGH (ref 4.8–5.6)
Mean Plasma Glucose: 291.96 mg/dL

## 2020-06-11 LAB — BASIC METABOLIC PANEL
Anion gap: 12 (ref 5–15)
BUN: 13 mg/dL (ref 6–20)
CO2: 27 mmol/L (ref 22–32)
Calcium: 9.2 mg/dL (ref 8.9–10.3)
Chloride: 101 mmol/L (ref 98–111)
Creatinine, Ser: 0.89 mg/dL (ref 0.44–1.00)
GFR, Estimated: >60 mL/min (ref >60)
Glucose, Bld: 152 mg/dL — ABNORMAL HIGH (ref 70–99)
Potassium: 3.5 mmol/L (ref 3.5–5.1)
Sodium: 140 mmol/L (ref 135–145)

## 2020-06-11 LAB — PHOSPHORUS: Phosphorus: 3.5 mg/dL (ref 2.5–4.6)

## 2020-06-11 LAB — HIV ANTIBODY (ROUTINE TESTING W REFLEX): HIV Screen 4th Generation wRfx: NONREACTIVE

## 2020-06-11 LAB — MAGNESIUM: Magnesium: 2.2 mg/dL (ref 1.7–2.4)

## 2020-06-11 MED ORDER — POTASSIUM CHLORIDE CRYS ER 20 MEQ PO TBCR
40.0000 meq | EXTENDED_RELEASE_TABLET | Freq: Every day | ORAL | Status: AC
  Administered 2020-06-11: 40 meq via ORAL
  Filled 2020-06-11: qty 2

## 2020-06-11 MED ORDER — FUROSEMIDE 10 MG/ML IJ SOLN
40.0000 mg | Freq: Two times a day (BID) | INTRAMUSCULAR | Status: DC
  Administered 2020-06-11 – 2020-06-12 (×3): 40 mg via INTRAVENOUS
  Filled 2020-06-11 (×3): qty 4

## 2020-06-11 NOTE — Progress Notes (Signed)
Subjective:  Overnight the patient's blood sugar increased to 420. Patient was given 5 additional units of Novolog for a total of 10 for the night time dose plus her 20 units of Lantus. Patient responded well, CBG decreased to 150s. This morning her CBG was 112.   Patient was seen and examined at bedside. Patient appeared and felt much better as compared to yesterday. She was sitting up at the side of the bed playing games on her phone. She reported a restful night of sleep. She denies any chest pain, palpitations, and SOB. She was on 2L of O2 [place]. She had no other major complaints.   Objective:  Vital signs in last 24 hours: Vitals:   06/10/20 2119 06/11/20 0123 06/11/20 0524 06/11/20 0800  BP: 125/74 92/60 107/75 110/66  Pulse: 100 94 79 (!) 103  Resp: 20 20 20    Temp: 98 F (36.7 C) 97.9 F (36.6 C) 98.4 F (36.9 C)   TempSrc: Oral Oral Oral   SpO2: 98% 96% 91% 99%  Weight:   103.9 kg   Height:       Weight change:   Intake/Output Summary (Last 24 hours) at 06/11/2020 1050 Last data filed at 06/11/2020 0718 Gross per 24 hour  Intake 720 ml  Output 1000 ml  Net -280 ml   Physical Exam Vitals and nursing note reviewed.  Constitutional:      General: She is not in acute distress.    Appearance: She is obese. She is not ill-appearing.  Cardiovascular:     Rate and Rhythm: Normal rate and regular rhythm.     Pulses: Normal pulses.     Heart sounds: Normal heart sounds. No murmur heard.  No friction rub. No gallop.   Pulmonary:     Effort: Pulmonary effort is normal. No respiratory distress.     Breath sounds: Normal breath sounds. No wheezing, rhonchi or rales.  Abdominal:     General: Bowel sounds are normal. There is no distension.     Palpations: Abdomen is soft.     Tenderness: There is no abdominal tenderness.  Musculoskeletal:     Right lower leg: 1+ Pitting Edema present.     Left lower leg: 1+ Pitting Edema present.  Neurological:     General: No focal  deficit present.     Mental Status: She is alert and oriented to person, place, and time.  Psychiatric:        Mood and Affect: Mood normal.        Behavior: Behavior normal.      Assessment/Plan:  Active Problems:   Hypertension   Type 2 diabetes mellitus (HCC)   Acute on chronic diastolic (congestive) heart failure ([place])   ILD (interstitial lung disease) ([place])   Acute heart failure ([place])  [name] is a 44 year old female with a PMHx of hypertension, type 2 diabetes mellitus, SLE, paroxysmal atrial fibrillation,chronic diastolic heart failure (EF 55-60% in 10/2019) moderate mitral regurgitation, and chronic respiratory failure on 3 L supplemental oxygen at home who presents with progressive and worsening shortness of breath.  Dyspnea on exertion, chronic respiratory failure Acute on chronic heart failure Patient is saturating at 91% on 2 L [place] of supplemental oxygen. She has diastolic heart failure and atrial fibrillation. She is responding well to diuresis. Her lower extremity swelling has improved overnight. Pericardial effusion on echo is stable per Dr. Virgina Jock and can be followed up as outpatient. Heart failure exacerbation likely secondary to running out of  medication to control A. fib. -Strict ins and outs -Daily weights -Continue IV Lasix 40 mg BID -Daily BMP -Diet: Heart health/carbg modified    Paroxysmal atrial fibrillation EKG shows atrial fibrillation with RVR.  Chart review shows patient has had ablation and failed in the past.  She is on metoprolol and diltiazem as outpatient.  - Continue Diltiazem  - Continue Lopressor 50 mg 3 times daily if rate controlled - Diuresis as above  -Eliquis 5 mg twice daily  Hypomagnesia  Hypokalemia K and Mg values are WNL after receiving replenishment yesterday. - F/u with BMP in AM  SLE -Continue home medications Plaquenil, Imuran, prednisone 20 mg  Mild microcytic anemia -Monitor hemoglobin, 11.6  Type  2 diabetes mellitus with hyperglycemia -Home regimen of Trulicity 8.25 mg, 20 units glargine, sliding scale lispro. -Patient reports blood glucose has been elevated since starting prednisone - hemoglobin A1c 11.1 04/19/2020 -Basal 20 units, SSI-m, 4 units w/ meals, QHS correction scale    LOS: 1 day   Lytle Michaels, Medical Student 06/11/2020, 10:50 AM

## 2020-06-12 LAB — BASIC METABOLIC PANEL
Anion gap: 10 (ref 5–15)
BUN: 16 mg/dL (ref 6–20)
CO2: 27 mmol/L (ref 22–32)
Calcium: 9.2 mg/dL (ref 8.9–10.3)
Chloride: 101 mmol/L (ref 98–111)
Creatinine, Ser: 0.9 mg/dL (ref 0.44–1.00)
GFR, Estimated: >60 mL/min (ref >60)
Glucose, Bld: 162 mg/dL — ABNORMAL HIGH (ref 70–99)
Potassium: 3.6 mmol/L (ref 3.5–5.1)
Sodium: 138 mmol/L (ref 135–145)

## 2020-06-12 LAB — GLUCOSE, CAPILLARY
Glucose-Capillary: 135 mg/dL — ABNORMAL HIGH (ref 70–99)
Glucose-Capillary: 282 mg/dL — ABNORMAL HIGH (ref 70–99)
Glucose-Capillary: 259 mg/dL — ABNORMAL HIGH (ref 70–99)
Glucose-Capillary: 250 mg/dL — ABNORMAL HIGH (ref 70–99)

## 2020-06-12 MED ORDER — FUROSEMIDE 10 MG/ML IJ SOLN
60.0000 mg | Freq: Two times a day (BID) | INTRAMUSCULAR | Status: DC
  Administered 2020-06-12 – 2020-06-13 (×2): 60 mg via INTRAVENOUS
  Filled 2020-06-12 (×2): qty 6

## 2020-06-12 MED ORDER — INSULIN ASPART 100 UNIT/ML ~~LOC~~ SOLN
8.0000 [IU] | Freq: Three times a day (TID) | SUBCUTANEOUS | Status: DC
  Administered 2020-06-13 (×2): 8 [IU] via SUBCUTANEOUS

## 2020-06-12 MED ORDER — POTASSIUM CHLORIDE CRYS ER 20 MEQ PO TBCR
40.0000 meq | EXTENDED_RELEASE_TABLET | Freq: Every day | ORAL | Status: AC
  Administered 2020-06-12: 40 meq via ORAL
  Filled 2020-06-12: qty 2

## 2020-06-12 NOTE — Plan of Care (Signed)
?  Problem: Education: ?Goal: Ability to demonstrate management of disease process will improve ?Outcome: Progressing ?  ?

## 2020-06-12 NOTE — Progress Notes (Signed)
Subjective: HD 2 Overnight, no acute events reported. Patient had UOP of 600cc overnight.   This morning, patient evaluated at bedside. She notes that she feels slightly better regarding her breathing. Continues to have lower extremity swelling. She has been able to get up and walk around.   Objective:  Vital signs in last 24 hours: Vitals:   06/12/20 0511 06/12/20 0512 06/12/20 0846 06/12/20 1236  BP:  111/83 (!) 103/54 104/78  Pulse:  95 100 99  Resp:  20 19 16   Temp:  98.3 F (36.8 C)  97.9 F (36.6 C)  TempSrc:  Oral  Oral  SpO2:  92% 95% 99%  Weight: 104.2 kg     Height:       CBC Latest Ref Rng & Units 06/11/2020 06/10/2020 04/19/2020  WBC 4.0 - 10.5 K/uL 7.7 9.6 12.4(H)  Hemoglobin 12.0 - 15.0 g/dL 11.9(L) 11.6(L) 13.2  Hematocrit 36 - 46 % 39.2 38.0 43.1  Platelets 150 - 400 K/uL 493(H) 482(H) 312   BMP Latest Ref Rng & Units 06/12/2020 06/11/2020 06/10/2020  Glucose 70 - 99 mg/dL 162(H) 152(H) 289(H)  BUN 6 - 20 mg/dL 16 13 11   Creatinine 0.44 - 1.00 mg/dL 0.90 0.89 0.92  BUN/Creat Ratio 9 - 23 - - -  Sodium 135 - 145 mmol/L 138 140 138  Potassium 3.5 - 5.1 mmol/L 3.6 3.5 2.8(L)  Chloride 98 - 111 mmol/L 101 101 97(L)  CO2 22 - 32 mmol/L 27 27 28   Calcium 8.9 - 10.3 mg/dL 9.2 9.2 9.0   Physical Exam  Constitutional: Appears well-developed and well-nourished. No distress.  HENT: Normocephalic and atraumatic, EOMI, conjunctiva normal, moist mucous membranes Cardiovascular: Irregularly irregular, normal rate, S1 and S2 present, no murmurs, rubs, gallops.  Distal pulses intact; unable to assess JVD present  Respiratory: No respiratory distress, no accessory muscle use.  Effort is normal.  Lungs are clear to auscultation bilaterally. GI: Nondistended, soft, nontender to palpation, normal active bowel sounds Musculoskeletal: Normal bulk and tone.  2+ pitting edema in bilateral lower extremities Neurological: Is alert and oriented x4, no apparent focal deficits  noted. Skin: Warm and dry.  Malar rash, macular rash on hands and feet  Psychiatric: Normal mood and affect. Behavior is normal. Judgment and thought content normal.    Assessment/Plan:  Active Problems:   Hypertension   Type 2 diabetes mellitus (HCC)   Acute on chronic diastolic (congestive) heart failure (HCC)   Atrial fibrillation with RVR ([place])   ILD (interstitial lung disease) ([place])   Acute heart failure ([place])  Ms. Malarie Tappen is a 44 year old female with PMHx of HTN, DM, SLE, PAF, HFpE (EF 55-60%), moderate mitral regurgitation and chronic respiratory failure on 3L O2 at home admitted for HFpEF exacerbation.   HFpEF exacerbation: Patient is on IV Lasix 40mg  twice daily. Overnight, she had 700cc UOP with stable weights from yesterday. Renal function is stable. Echo from this admission with LVE 55-60% without RWMA; RV systolic function mildly reduced and moderate pericardial effusion that is stable from prior study in March 2021. Continues to appear hypervolemic on examination.  - Increase Lasix to 60mg  bid - Strict I&O - Monitor and replete electrolytes prn   Paroxysmal atrial fibrillation: Patient is on metoprolol, diltiazem and Eliquis. She is currently rate controlled.  - Continue lopressor 50mg  tid - Continue cardizem 240mg  daily  - Continue Eliquis 5mg  bid  - Continue cardiac monitoring   SLE: Continue home medications of plaquenil, imuran and prednisone  20mg  daily   Type II DM:  Most recent HbA1c of 11.1 in September 2021. She is on prednisone which could be precipitating the hyperglycemia. Current CBG's with postprandial hyperglycemia - Continue Lantus 20U  - Increase Novolog to 8U tid with meals + SSI tid + SSI qHS  - Continue CBG monitoring   Prior to Admission Living Arrangement: Home Anticipated Discharge Location: Home  Barriers to Discharge: Continued medical management  Dispo: Anticipated discharge in approximately 1-2 day(s).   Harvie Heck, MD   IMTS PGY-2 06/12/2020, 2:59 PM After 5pm on weekdays and 1pm on weekends: On Call pager [phone]

## 2020-06-13 LAB — GLUCOSE, CAPILLARY
Glucose-Capillary: 101 mg/dL — ABNORMAL HIGH (ref 70–99)
Glucose-Capillary: 238 mg/dL — ABNORMAL HIGH (ref 70–99)

## 2020-06-13 LAB — BASIC METABOLIC PANEL
Anion gap: 13 (ref 5–15)
BUN: 17 mg/dL (ref 6–20)
CO2: 22 mmol/L (ref 22–32)
Calcium: 9.2 mg/dL (ref 8.9–10.3)
Chloride: 102 mmol/L (ref 98–111)
Creatinine, Ser: 1.02 mg/dL — ABNORMAL HIGH (ref 0.44–1.00)
GFR, Estimated: >60 mL/min (ref >60)
Glucose, Bld: 266 mg/dL — ABNORMAL HIGH (ref 70–99)
Potassium: 3.6 mmol/L (ref 3.5–5.1)
Sodium: 137 mmol/L (ref 135–145)

## 2020-06-13 MED ORDER — METOPROLOL TARTRATE 50 MG PO TABS: 50.0000 mg | ORAL_TABLET | Freq: Two times a day (BID) | ORAL | 0 refills | Status: DC

## 2020-06-13 MED ORDER — INSULIN LISPRO 100 UNIT/ML ~~LOC~~ SOLN: 8.0000 [IU] | Freq: Three times a day (TID) | SUBCUTANEOUS | 0 refills | Status: DC

## 2020-06-13 MED ORDER — FUROSEMIDE 40 MG PO TABS
40.0000 mg | ORAL_TABLET | Freq: Two times a day (BID) | ORAL | 0 refills | Status: DC
Start: 2020-06-13 — End: 2020-06-28

## 2020-06-13 MED FILL — METOPROLOL TARTRATE 50 MG T: 50 | 30 days supply | Qty: 60 | Fill #0

## 2020-06-13 MED FILL — HumaLOG 100 UNIT/ML SOLN: 100 | 28 days supply | Qty: 10 | Fill #0

## 2020-06-13 MED FILL — FUROSEMIDE 40 MG TABLET: 40 | 15 days supply | Qty: 30 | Fill #0

## 2020-06-13 NOTE — Progress Notes (Signed)
Awaiting for CM to deliver O2 tank at this time.

## 2020-06-13 NOTE — Progress Notes (Signed)
After many unsuccessful attempt to relocate her O2 tank in the ER, patient will receive tank from CM and will contact [facility] tomorrow.  AVS reviewed. All questions answered at this time. Transport provided per spouse.

## 2020-06-13 NOTE — Progress Notes (Signed)
Patient has d/c orders. Patient voices that she arrived to [facility] ED with her O2 tank but it was never brought up to the room. RN attempted to call ED CN to look for the tank and unsuccessful. RN also reached out to after hour CM. CM will attempt to locate and give her the O2 tank.  MD also aware of situation.

## 2020-06-13 NOTE — Evaluation (Signed)
Physical Therapy Evaluation Patient Details Name: [name] MRN: [id number] DOB: November 28, 1975 Today's Date: 06/13/2020   History of Present Illness  [name] is a 44 year old woman with PMH of SLE with chronic ILD and chronic respiratory failure, DM2, HTN, Afib - paroxysmal and HFpEF chornic who presented with increase oxygen needs, worsening SOB and swelling.  She was recently started on prednisone for issues with her SLE and she also recently ran out of her metoprolol.  She presented with excess volume and in Afib with RVR which has improved with metoprolol dosing.  Clinical Impression  Patient evaluated by Physical Therapy with no further acute PT needs identified. Pt ambulating 200 feet with no assistive device without physical difficulty. Negotiated 10 steps with railing to simulate apartment set up. Desaturation to 87%; bumped up to 3L O2 to rebound > 90% during mobility. Pt reports she monitors her oxygen saturation levels with a pulse oximeter at home. All education has been completed and the patient has no further questions. No follow-up Physical Therapy or equipment needs. PT is signing off. Thank you for this referral.     Follow Up Recommendations No PT follow up    Equipment Recommendations  None recommended by PT    Recommendations for Other Services       Precautions / Restrictions Precautions Precautions: None Restrictions Weight Bearing Restrictions: No      Mobility  Bed Mobility Overal bed mobility: Independent             General bed mobility comments: Sitting EOB upon arrival    Transfers Overall transfer level: Independent Equipment used: None                Ambulation/Gait Ambulation/Gait assistance: Modified independent (Device/Increase time) ((slower pace)) Gait Distance (Feet): 200 Feet Assistive device: None Gait Pattern/deviations: Step-through pattern;Decreased stride length Gait velocity: decreased   General Gait  Details: Slower pace, no gross imbalance  Stairs Stairs: Yes Stairs assistance: Modified independent (Device/Increase time) Stair Management: One rail Right Number of Stairs: 10 General stair comments: Cues for step by step, activity pacing  Wheelchair Mobility    Modified Rankin (Stroke Patients Only)       Balance Overall balance assessment: No apparent balance deficits (not formally assessed)                                           Pertinent Vitals/Pain Pain Assessment: No/denies pain    Home Living Family/patient expects to be discharged to:: Private residence Living Arrangements: Children;Spouse/significant other Available Help at Discharge: Family;Available PRN/intermittently Type of Home: Apartment Home Access: Stairs to enter Entrance Stairs-Rails: Right;Left Entrance Stairs-Number of Steps: 2 flights Home Layout: One level Home Equipment: None      Prior Function Level of Independence: Independent         Comments: Independent with ADLs, IADLs, grocery shopping and driving. no use of AD. Recent hx of using oxygen     Hand Dominance   Dominant Hand: Right    Extremity/Trunk Assessment   Upper Extremity Assessment Upper Extremity Assessment: Overall WFL for tasks assessed    Lower Extremity Assessment Lower Extremity Assessment: Overall WFL for tasks assessed    Cervical / Trunk Assessment Cervical / Trunk Assessment: Normal  Communication   Communication: No difficulties  Cognition Arousal/Alertness: Awake/alert Behavior During Therapy: WFL for tasks assessed/performed Overall Cognitive Status: Within Functional  Limits for tasks assessed                                        General Comments      Exercises     Assessment/Plan    PT Assessment Patent does not need any further PT services  PT Problem List         PT Treatment Interventions      PT Goals (Current goals can be found in the  Care Plan section)  Acute Rehab PT Goals Patient Stated Goal: go home PT Goal Formulation: All assessment and education complete, DC therapy    Frequency     Barriers to discharge        Co-evaluation               AM-PAC PT "6 Clicks" Mobility  Outcome Measure Help needed turning from your back to your side while in a flat bed without using bedrails?: None Help needed moving from lying on your back to sitting on the side of a flat bed without using bedrails?: None Help needed moving to and from a bed to a chair (including a wheelchair)?: None Help needed standing up from a chair using your arms (e.g., wheelchair or bedside chair)?: None Help needed to walk in hospital room?: None Help needed climbing 3-5 steps with a railing? : None 6 Click Score: 24    End of Session   Activity Tolerance: Patient tolerated treatment well Patient left: in bed;with call bell/phone within reach Nurse Communication: Mobility status PT Visit Diagnosis: Difficulty in walking, not elsewhere classified (R26.2)    Time: 1610-9604 PT Time Calculation (min) (ACUTE ONLY): 15 min   Charges:   PT Evaluation $PT Eval Low Complexity: [place], PT, DPT Acute Rehabilitation Services Pager [phone] Office [phone]   [name] 06/13/2020, 5:00 PM

## 2020-06-13 NOTE — Progress Notes (Signed)
Inpatient Diabetes Program Recommendations  AACE/ADA: New Consensus Statement on Inpatient Glycemic Control (2015)  Target Ranges:  Prepandial:   less than 140 mg/dL      Peak postprandial:   less than 180 mg/dL (1-2 hours)      Critically ill patients:  140 - 180 mg/dL   Lab Results  Component Value Date   GLUCAP 238 (H) 06/13/2020   HGBA1C 11.8 (H) 06/11/2020    Review of Glycemic Control Results for SIMAYA, LUMADUE (MRN [id number]) as of 06/13/2020 12:17  Ref. Range 06/12/2020 16:11 06/12/2020 21:23 06/13/2020 06:06 06/13/2020 11:07  Glucose-Capillary Latest Ref Range: 70 - 99 mg/dL 259 (H) 250 (H) 101 (H) 238 (H)   Diabetes history: Type 2 DM Outpatient Diabetes medications: Trulicity 3.55 mg qwk, Basaglar 20 units QHS, Humalog 2-10 units TID Current orders for Inpatient glycemic control: Lantus 20 units QHS, Novolog 8 units TID, Novolog 0-15 units TID, Novolog 0-5 units QHS Prednisone 20 mg QAM  Inpatient Diabetes Program Recommendations:    Spoke with patient regarding outpatient diabetes management. Patient reports that she has struggled with diabetes since being on steroids.  Reviewed patient's current A1c of 11.8%. Explained what a A1c is and what it measures. Also reviewed goal A1c with patient, importance of good glucose control @ home, and blood sugar goals. Reviewed patho of DM, need for insulin, role of pancreas, impact of steroids, differences between long acting vs short acting insulin, differences between correction and meal coverage, post prandial measures, vascular changes and comorbidites. Patient has a meter and uses it 3 times per day. Has used [employer] in the past. Stopped using because of cost, however, is interested in using short term to figure out doses. Reviewed when to check CBGs and stressed the importance of when to call MD. Patient is followed by [facility] and is working with the pharmacy on affording medications.  Discussed [facility] with MD, Order  provided for placement prior to discharge. Patient has no furhter questions at this time.   Thanks, Bronson Curb, MSN, RNC-OB Diabetes Coordinator [phone] (8a-5p)

## 2020-06-13 NOTE — TOC Initial Note (Signed)
Transition of Care [facility]) - Initial/Assessment Note    Patient Details  Name: [name] MRN: [id number] Date of Birth: [birth year]  Transition of [facility] CM/SW Contact:    Marilu Favre, RN Phone Number: 06/13/2020, 10:47 AM  Clinical Narrative:                 Patient from home. Patient recently was set up with home oxygen . She does have portable tank . Patient unsure of name of home oxygen agency.     Expected Discharge Plan: Home/Self Care Barriers to Discharge: Continued Medical Work up   Patient Goals and CMS Choice Patient states their goals for this hospitalization and ongoing recovery are:: to return to home CMS Medicare.gov Compare Post Acute Care list provided to:: Patient Choice offered to / list presented to : NA  Expected Discharge Plan and Services Expected Discharge Plan: Home/Self Care       Living arrangements for the past 2 months: Apartment                 DME Arranged: N/A         HH Arranged: NA          Prior Living Arrangements/Services Living arrangements for the past 2 months: Apartment Lives with:: Spouse Patient language and need for interpreter reviewed:: Yes Do you feel safe going back to the place where you live?: Yes      Need for Family Participation in Patient Care: Yes (Comment) Care giver support system in place?: Yes (comment) Current home services: DME Criminal Activity/Legal Involvement Pertinent to Current Situation/Hospitalization: No - Comment as needed  Activities of Daily Living Home Assistive Devices/Equipment: Oxygen ADL Screening (condition at time of admission) Patient's cognitive ability adequate to safely complete daily activities?: Yes Is the patient deaf or have difficulty hearing?: No Does the patient have difficulty seeing, even when wearing glasses/contacts?: No Does the patient have difficulty concentrating, remembering, or making decisions?: No Patient able to express need for assistance  with ADLs?: Yes Does the patient have difficulty dressing or bathing?: No Independently performs ADLs?: Yes (appropriate for developmental age) Does the patient have difficulty walking or climbing stairs?: Yes Weakness of Legs: None Weakness of Arms/Hands: None  Permission Sought/Granted   Permission granted to share information with : No              Emotional Assessment Appearance:: Appears stated age Attitude/Demeanor/Rapport: Engaged Affect (typically observed): Accepting Orientation: : Oriented to Self, Oriented to Place, Oriented to  Time, Oriented to Situation Alcohol / Substance Use: Not Applicable Psych Involvement: No (comment)  Admission diagnosis:  Acute heart failure (HCC) [I50.9] Atrial fibrillation with RVR (HCC) [I48.91] Acute on chronic congestive heart failure, unspecified heart failure type [facility]) [I50.9] Patient Active Problem List   Diagnosis Date Noted  . Acute heart failure ([place]) 06/10/2020  . ILD (interstitial lung disease) ([place]) 01/12/2019  . Coronary artery disease 12/12/2018  . Nonrheumatic aortic valve stenosis 11/19/2018  . Postinflammatory pulmonary fibrosis ([place]) 09/25/2018  . Hypokalemia 09/17/2018  . AKI (acute kidney injury) ([place]) 08/25/2018  . Encounter for annual routine gynecological examination 07/09/2018  . Abnormal vaginal Pap smear 07/09/2018  . Depo-Provera contraceptive status 07/09/2018  . Atrial fibrillation with RVR ([place]) 05/24/2018  . Paroxysmal atrial fibrillation ([place]) 05/12/2018  . Atrial flutter ([place]) 05/12/2018  . Pericardial effusion 01/16/2018  . Acute on chronic diastolic (congestive) heart failure ([place]) 01/16/2018  . SOB (shortness of breath) 12/25/2017  . Hypertension 12/25/2017  .  Microcytic anemia 12/25/2017  . Type 2 diabetes mellitus ([place]) 12/25/2017  . Chronic pain of both knees 04/23/2017  . Fibroids 01/20/2015  . Menorrhagia 01/20/2015  . Low grade squamous intraepithelial lesion (LGSIL) on cervical Pap  smear on 01/20/15 01/20/2015  . Atypical glandular cells on cervical Pap smear on 01/20/15 01/20/2015  . Lupus ([place]) 07/15/2013   PCP:  Gildardo Pounds, NP Pharmacy:   [facility] [address] [place] - [address] [place] [address] Phone: [phone] Fax: [place], [address] [place] [address] Phone: [phone] Fax: [phone]     Social Determinants of Health (SDOH) Interventions    Readmission Risk Interventions No flowsheet data found.

## 2020-06-13 NOTE — Progress Notes (Deleted)
_0  ID: [name], female    DOB: 05/16/1976, 44 y.o.   MRN: [id number]  No chief complaint on file.   Referring provider: Gildardo Pounds, NP  HPI:   Allergies  Allergen Reactions  . Septra [Bactrim] Hives  . Sulfamethoxazole-Trimethoprim Hives         Immunization History  Administered Date(s) Administered  . Influenza,inj,Quad PF,6+ Mos 05/25/2013, 05/14/2018, 05/13/2019  . PFIZER SARS-COV-2 Vaccination 11/20/2019, 12/15/2019  . Pneumococcal Polysaccharide-23 05/14/2018  . Tdap 07/09/2018    Past Medical History:  Diagnosis Date  . Abnormal Pap smear of cervix    colpo, HPV  . Allergy   . Anemia   . Atrial fibrillation ([place])   . CHF (congestive heart failure) ([place])   . Depression    after losses  . Dyspnea   . Enlarged heart    managed by cardiology  . Fibroid   . Gestational diabetes   . Heart murmur   . Hypertension   . Infection    UTI  . Lupus ([place]) 1999  . Pericarditis     Tobacco History: Social History   Tobacco Use  Smoking Status Never Smoker  Smokeless Tobacco Never Used   Counseling given: Not Answered   Facility-Administered Medications Prior to Visit  Medication Dose Route Frequency Provider Last Rate Last Admin  . 0.9 %  sodium chloride infusion  250 mL Intravenous PRN Madalyn Rob, MD      . acetaminophen (TYLENOL) tablet 650 mg  650 mg Oral Q4H PRN Madalyn Rob, MD      . albuterol (VENTOLIN HFA) 108 (90 Base) MCG/ACT inhaler 2 puff  2 puff Inhalation Q6H PRN Madalyn Rob, MD      . apixaban Arne Cleveland) tablet 5 mg  5 mg Oral BID Madalyn Rob, MD   5 mg at 06/13/20 0849  . aspirin EC tablet 81 mg  81 mg Oral Daily Madalyn Rob, MD   81 mg at 06/13/20 0848  . azaTHIOprine (IMURAN) tablet 50 mg  50 mg Oral BID Madalyn Rob, MD   50 mg at 06/13/20 0848  . diltiazem (CARDIZEM CD) 24 hr capsule 240 mg  240 mg Oral Daily Madalyn Rob, MD   240 mg at 06/13/20 0848  . furosemide (LASIX) injection 60 mg  60 mg Intravenous  BID Harvie Heck, MD   60 mg at 06/13/20 0852  . guaiFENesin (MUCINEX) 12 hr tablet 1,200 mg  1,200 mg Oral Q12H PRN Madalyn Rob, MD      . hydroxychloroquine (PLAQUENIL) tablet 200 mg  200 mg Oral BID Madalyn Rob, MD   200 mg at 06/13/20 0849  . insulin aspart (novoLOG) injection 0-15 Units  0-15 Units Subcutaneous TID WC Madalyn Rob, MD   5 Units at 06/13/20 1200  . insulin aspart (novoLOG) injection 0-5 Units  0-5 Units Subcutaneous QHS Madalyn Rob, MD   2 Units at 06/12/20 2134  . insulin aspart (novoLOG) injection 8 Units  8 Units Subcutaneous TID WC Harvie Heck, MD   8 Units at 06/13/20 1214  . insulin glargine (LANTUS) injection 20 Units  20 Units Subcutaneous QHS Madalyn Rob, MD   20 Units at 06/12/20 2136  . metoprolol tartrate (LOPRESSOR) tablet 50 mg  50 mg Oral TID Madalyn Rob, MD   50 mg at 06/13/20 0848  . ondansetron (ZOFRAN) injection 4 mg  4 mg Intravenous Q6H PRN Madalyn Rob, MD      . predniSONE (DELTASONE) tablet 20 mg  20 mg  Oral Q breakfast Madalyn Rob, MD   20 mg at 06/13/20 0848  . sodium chloride flush (NS) 0.9 % injection 3 mL  3 mL Intravenous Q12H Madalyn Rob, MD   3 mL at 06/13/20 0851  . sodium chloride flush (NS) 0.9 % injection 3 mL  3 mL Intravenous PRN Madalyn Rob, MD       Outpatient Medications Prior to Visit  Medication Sig Dispense Refill  . acetaminophen (TYLENOL) 500 MG tablet Take 1,000 mg by mouth every 6 (six) hours as needed for headache (pain).    Marland Kitchen albuterol (VENTOLIN HFA) 108 (90 Base) MCG/ACT inhaler Inhale 2 puffs into the lungs every 6 (six) hours as needed for wheezing or shortness of breath. 8 g 5  . aspirin EC 81 MG tablet Take 1 tablet (81 mg total) by mouth daily. Swallow whole. 90 tablet 3  . azaTHIOprine (IMURAN) 50 MG tablet Take 50 mg by mouth 2 (two) times daily.    . Blood Glucose Monitoring Suppl (ONETOUCH VERIO) w/Device KIT 1 each by Does not apply route 3 (three) times daily. 1 kit 0  . colchicine 0.6 MG tablet Take 1  tablet (0.6 mg total) by mouth daily. (Patient not taking: Reported on 06/10/2020) 60 tablet 1  . Continuous Blood Gluc Receiver (FREESTYLE LIBRE 2 READER) DEVI Use as instructed. Check blood glucose levels five times per day per day. E11.65 Z91.89 1 each 1  . Continuous Blood Gluc Sensor (FREESTYLE LIBRE 2 SENSOR) MISC Use as instructed. Check blood glucose levels 5 times per day. E11.65 Z91.89 1 each 6  . diltiazem (CARDIZEM CD) 240 MG 24 hr capsule TAKE 1 CAPSULE BY MOUTH EVERY DAY (Patient taking differently: Take 240 mg by mouth daily. ) 90 capsule 2  . Dulaglutide (TRULICITY) 6.57 XU/3.8BF SOPN Inject 0.5 mLs (0.75 mg total) into the skin once a week. (Patient taking differently: Inject 0.75 mg into the skin every Thursday. ) 6 mL 1  . ELIQUIS 5 MG TABS tablet TAKE 1 TABLET TWICE A DAY (Patient taking differently: Take 5 mg by mouth 2 (two) times daily. ) 180 tablet 2  . ESBRIET 267 MG CAPS Take 1 capsule by mouth in the morning, at noon, and at bedtime for 7 days, THEN 2 capsules in the morning, at noon, and at bedtime for 7 days, THEN 3 capsules in the morning, at noon, and at bedtime for 14 days. 189 capsule 0  . furosemide (LASIX) 40 MG tablet Take 1 tablet (40 mg total) by mouth 2 (two) times daily. 30 tablet 0  . glucose blood (ACCU-CHEK GUIDE) test strip Use as instructed to check blood sugar twice daily. E11.65 100 each 6  . guaiFENesin (MUCINEX) 600 MG 12 hr tablet Take 1,200 mg by mouth every 12 (twelve) hours as needed for cough or to loosen phlegm.    . hydroxychloroquine (PLAQUENIL) 200 MG tablet Take 1 tablet (200 mg total) by mouth 2 (two) times daily. 60 tablet 3  . Insulin Glargine (BASAGLAR KWIKPEN) 100 UNIT/ML Inject 0.2 mLs (20 Units total) into the skin daily. E11.65. may increase by 2 units every 3 days for fasting blood glucose readings greater than 130. (Patient taking differently: Inject 20 Units into the skin at bedtime. ) 6 mL 6  . insulin lispro (HUMALOG KWIKPEN) 100  UNIT/ML KwikPen For blood sugars 0-150 give 0 units of insulin, 151-200 give 2 units of insulin, 201-250 give 4 units, 251-300 give 6 units, 301-350 give 8 units, 351-400 give  10 units,> 400 give 12 units and call office. Discussed hypoglycemia protocol. (Patient taking differently: Inject 2-10 Units into the skin See admin instructions. Inject 2-10 units subcutaneously three times daily with meals per sliding scale: CBG 0-150  0 units of insulin, 151-200 inject 2 units of insulin, 201-250  4 units, 251-300  6 units, 301-350  8 units, 351-400  10 units,> 400 12 units and call office. Discussed hypoglycemia protocol.) 15 mL 11  . insulin lispro (HUMALOG) 100 UNIT/ML injection Inject 0.08 mLs (8 Units total) into the skin 3 (three) times daily with meals. 7.2 mL 0  . Insulin Pen Needle (TRUEPLUS PEN NEEDLES) 32G X 4 MM MISC Use as directed to inject insulin 5x daily 200 each 3  . Lancet Devices (ONE TOUCH DELICA LANCING DEV) MISC 1 each by Does not apply route 3 (three) times daily. 1 each 1  . losartan (COZAAR) 50 MG tablet TAKE 1 TABLET BY MOUTH EVERY DAY (Patient taking differently: Take 50 mg by mouth daily. ) 90 tablet 3  . medroxyPROGESTERone (DEPO-PROVERA) 150 MG/ML injection Inject 150 mg into the muscle every 3 (three) months.    . metoprolol tartrate (LOPRESSOR) 50 MG tablet Take 1 tablet (50 mg total) by mouth 2 (two) times daily. 60 tablet 0  . OneTouch Delica Lancets 23J MISC 1 each by Does not apply route 3 (three) times daily. 100 each 12  . Phenyleph-CPM-DM-Aspirin (ALKA-SELTZER PLUS COLD & COUGH PO) Take 2 tablets by mouth every 4 (four) hours as needed (cough/congestion).     . predniSONE (DELTASONE) 20 MG tablet Take 1 tablet (20 mg total) by mouth daily with breakfast. 30 tablet 0  . rosuvastatin (CRESTOR) 20 MG tablet TAKE 1 TABLET BY MOUTH EVERY DAY AT 6PM (Patient taking differently: Take 20 mg by mouth daily at 6 PM. TAKE 1 TABLET BY MOUTH EVERY DAY AT 6PM) 90 tablet 1       Review of Systems  Review of Systems   Physical Exam  There were no vitals taken for this visit. Physical Exam   Lab Results:  CBC    Component Value Date/Time   WBC 7.7 06/11/2020 0051   RBC 4.98 06/11/2020 0051   HGB 11.9 (L) 06/11/2020 0051   HGB 13.2 04/19/2020 1431   HCT 39.2 06/11/2020 0051   HCT 43.1 04/19/2020 1431   PLT 493 (H) 06/11/2020 0051   PLT 312 04/19/2020 1431   MCV 78.7 (L) 06/11/2020 0051   MCV 83 04/19/2020 1431   MCH 23.9 (L) 06/11/2020 0051   MCHC 30.4 06/11/2020 0051   RDW 16.8 (H) 06/11/2020 0051   RDW 16.5 (H) 04/19/2020 1431   LYMPHSABS 1.1 06/11/2020 0051   LYMPHSABS 0.6 (L) 03/05/2019 1433   MONOABS 0.6 06/11/2020 0051   EOSABS 0.0 06/11/2020 0051   EOSABS 0.1 03/05/2019 1433   BASOSABS 0.0 06/11/2020 0051   BASOSABS 0.0 03/05/2019 1433    BMET    Component Value Date/Time   NA 137 06/13/2020 0810   NA 137 04/19/2020 1431   K 3.6 06/13/2020 0810   CL 102 06/13/2020 0810   CO2 22 06/13/2020 0810   GLUCOSE 266 (H) 06/13/2020 0810   BUN 17 06/13/2020 0810   BUN 19 04/19/2020 1431   CREATININE 1.02 (H) 06/13/2020 0810   CALCIUM 9.2 06/13/2020 0810   GFRNONAA >60 06/13/2020 0810   GFRAA 73 04/19/2020 1431    BNP    Component Value Date/Time   BNP 210.1 (H) 06/10/2020 1040  ProBNP    Component Value Date/Time   PROBNP 69.0 08/18/2008 1430    Imaging: DG Chest Portable 1 View  Result Date: 06/10/2020 CLINICAL DATA:  Shortness of breath. History AFib, CHF, diabetes, hypertension, enlarged heart, lupus, pericarditis. EXAM: PORTABLE CHEST 1 VIEW COMPARISON:  04/05/2020 FINDINGS: Similar enlarged cardiac silhouette. Mild diffuse interstitial thickening and hazy airspace opacities. More dense left basilar opacity. No visible pleural effusions or pneumothorax. No acute osseous abnormality. IMPRESSION: 1. Mild diffuse interstitial thickening and hazy airspace opacities, suggestive of mild pulmonary edema. 2. More dense  left basilar consolidation may represent atelectasis and/or pneumonia. 3. Similar enlarged cardiac silhouette, which may represent cardiomegaly and/or pericardial effusion in this patient with a history of pericarditis. Electronically Signed   By: Margaretha Sheffield MD   On: 06/10/2020 11:07   ECHOCARDIOGRAM COMPLETE  Result Date: 06/10/2020    ECHOCARDIOGRAM REPORT   Patient Name:   ANTONETTA CLANTON Date of Exam: 06/10/2020 Medical Rec #:  [id number]         Height:       60.0 in Accession #:    [id number]        Weight:       232.0 lb Date of Birth:  12/30/1975         BSA:          1.988 m Patient Age:    59 years          BP:           107/62 mmHg Patient Gender: F                 HR:           110 bpm. Exam Location:  Inpatient Procedure: 2D Echo, Cardiac Doppler, Color Doppler and Intracardiac            Opacification Agent Indications:     Dyspnea  History:         Patient has prior history of Echocardiogram examinations, most                  recent 12/10/2018. CHF, Aortic Valve Disease, Arrythmias:Atrial                  Fibrillation and Atrial Flutter; Risk Factors:Diabetes and                  Hypertension. Lupus.  Sonographer:     Clayton Lefort RDCS (AE) Referring Phys:  [id number] Elnora Morrison Diagnosing Phys: Vernell Leep MD IMPRESSIONS  1. Left ventricular ejection fraction, by estimation, is 55 to 60%. The left ventricle has normal function. The left ventricle has no regional wall motion abnormalities. There is mild left ventricular hypertrophy. Left ventricular diastolic parameters are indeterminate.  2. Right ventricular systolic function is mildly reduced. The right ventricular size is not well visualized.  3. Left atrial size was moderately dilated.  4. Right atrial size was mildly dilated.  5. Moderate pericardial effusion. The pericardial effusion is posterior to the left ventricle and the left atrium. There is no evidence of cardiac tamponade.  6. No significant change compared to previous  outpatient study in 10/2019. FINDINGS  Left Ventricle: Left ventricular ejection fraction, by estimation, is 55 to 60%. The left ventricle has normal function. The left ventricle has no regional wall motion abnormalities. The left ventricular internal cavity size was normal in size. There is  mild left ventricular hypertrophy. Left ventricular diastolic parameters are indeterminate. Right Ventricle: The right ventricular  size is not well visualized. No increase in right ventricular wall thickness. Right ventricular systolic function is mildly reduced. Left Atrium: Left atrial size was moderately dilated. Right Atrium: Right atrial size was mildly dilated. Pericardium: A moderately sized pericardial effusion is present. The pericardial effusion is posterior to the left ventricle and the left atrium. There is no evidence of cardiac tamponade. Mitral Valve: The mitral valve is grossly normal. No evidence of mitral valve regurgitation. Tricuspid Valve: The tricuspid valve is not well visualized. Tricuspid valve regurgitation is not demonstrated. Aortic Valve: The aortic valve is tricuspid. Aortic valve regurgitation is not visualized. Mild aortic stenosis is present. Aortic valve mean gradient measures 10.2 mmHg. Aortic valve peak gradient measures 17.2 mmHg. Aortic valve area, by VTI measures 1.14 cm. Pulmonic Valve: The pulmonic valve was not assessed. Pulmonic valve regurgitation is not visualized. Aorta: The aortic root is normal in size and structure. IAS/Shunts: No atrial level shunt detected by color flow Doppler.  LEFT VENTRICLE PLAX 2D LVIDd:         4.30 cm LVIDs:         3.00 cm LV PW:         1.50 cm LV IVS:        1.40 cm LVOT diam:     2.00 cm LV SV:         41 LV SV Index:   20 LVOT Area:     3.14 cm  RIGHT VENTRICLE            IVC RV Basal diam:  3.40 cm    IVC diam: 1.80 cm RV S prime:     7.62 cm/s TAPSE (M-mode): 1.0 cm LEFT ATRIUM              Index       RIGHT ATRIUM           Index LA diam:         4.60 cm  2.31 cm/m  RA Area:     19.20 cm LA Vol (A2C):   61.7 ml  31.03 ml/m RA Volume:   51.00 ml  25.65 ml/m LA Vol (A4C):   140.0 ml 70.42 ml/m LA Biplane Vol: 100.0 ml 50.30 ml/m  AORTIC VALVE AV Area (Vmax):    1.17 cm AV Area (Vmean):   1.13 cm AV Area (VTI):     1.14 cm AV Vmax:           207.20 cm/s AV Vmean:          149.000 cm/s AV VTI:            0.357 m AV Peak Grad:      17.2 mmHg AV Mean Grad:      10.2 mmHg LVOT Vmax:         77.06 cm/s LVOT Vmean:        53.800 cm/s LVOT VTI:          0.130 m LVOT/AV VTI ratio: 0.36  AORTA Ao Root diam: 3.10 cm Ao Asc diam:  3.40 cm  SHUNTS Systemic VTI:  0.13 m Systemic Diam: 2.00 cm Vernell Leep MD Electronically signed by Vernell Leep MD Signature Date/Time: 06/10/2020/4:25:58 PM    Final      Assessment & Plan:   No problem-specific Assessment & Plan notes found for this encounter.     Martyn Ehrich, NP 06/13/2020

## 2020-06-13 NOTE — Progress Notes (Addendum)
Subjective:  No reported acute events overnight.  Patient was seen and examined at bedside. She was lying down on her left side. She reports feeling better as compared to the day of admission. She is saturating 99% on [place] of 2L supplemental oxygen. She denies any SOB or chest pain. She said she feels most out of breath when coughing which she attributes to her ILD as opposed to her history of heart failure. She said she will try to get out of bed today to see how she feels when walking. No other major complaints. She feels well enough to go home if cleared.  Objective:  Vital signs in last 24 hours: Vitals:   06/12/20 1756 06/12/20 2123 06/13/20 0600 06/13/20 1131  BP: 109/73 119/87 115/76 114/85  Pulse: (!) 101 73 99 98  Resp: 16 16 18 18   Temp: 98.5 F (36.9 C) 98.5 F (36.9 C) 98.4 F (36.9 C) 98.4 F (36.9 C)  TempSrc: Oral Oral Oral Oral  SpO2: 98% 100% 99% 98%  Weight:   102.8 kg   Height:       Weight change: -1.4 kg  Intake/Output Summary (Last 24 hours) at 06/13/2020 1249 Last data filed at 06/13/2020 1215 Gross per 24 hour  Intake 1080 ml  Output 2700 ml  Net -1620 ml   Physical Exam:  General: NAD, obese,well-appearing, alert, interactive CV: Irregularly irregular, normal rate, S1/S2 present, soft systolic murmur appreciated at apex, no rubs/gallops, bilateral LE 2+ pitting edema  Pulm: 2L O2 using [place], no wheezing/rhonchi/rales, CTAB ABD: soft, non-tender, normal bowel sounds Neuro: AAOx3, no focal deficits Skin: Malar rash on face   Assessment/Plan:  Principal Problem:   Acute on chronic diastolic (congestive) heart failure (HCC) Active Problems:   Hypertension   Type 2 diabetes mellitus (HCC)   Atrial fibrillation with RVR (HCC)   ILD (interstitial lung disease) ([place])   Ms. Chinara Hertzberg is a 44 year old female with PMHx of HTN, DM, SLE, PAF, HFpE (EF 55-60%), moderate mitral regurgitation and chronic respiratory failure on 3L O2 at home admitted  for HFpEF exacerbation.    HFpEF exacerbation: Patient is on IV Lasix 60mg  twice daily. Overnight, she had 2900cc UOP with stable weights from yesterday. Renal function is stable. Echo from this admission with LVE 55-60% without RWMA; RV systolic function mildly reduced and moderate pericardial effusion that is stable from prior study in March 2021. She is responding well to the diuresis, but still has some lower extremity edema. We've encouraged her to get out of bed and ambulate to see how she tolerates. If she feels well enough, possibly discharge today.  - Continue Lasix 60mg  bid - Strict I&O - Encourage patient to get out of bed and ambulate - Monitor and replete electrolytes prn    Paroxysmal atrial fibrillation: Patient is on metoprolol, diltiazem and Eliquis. She is currently rate controlled.  - Continue lopressor 50mg  tid - Continue cardizem 240mg  daily  - Continue Eliquis 5mg  bid  - Spoke with Dr. Virgina Jock and he said adding an AAD for rhythm control can be discussed as outpatient during her upcoming follow up appointment. She should be discharged with her current medications as long as she stays rate controlled. - Continue cardiac monitoring    SLE: Continue home medications of plaquenil, imuran and prednisone 20mg  daily.    Type II DM:  Most recent HbA1c of 11.1 in September 2021. She is on prednisone which could be precipitating the hyperglycemia. Current CBG's with postprandial  hyperglycemia. - Continue Lantus 20U  - Increase Novolog to 8U tid with meals + SSI tid + SSI qHS  - Continue CBG monitoring - Recommend following up with PCP to see if they can adjust her current home regimen   LOS: 3 days   Lytle Michaels, Medical Student 06/13/2020, 12:49 PM

## 2020-06-13 NOTE — Assessment & Plan Note (Addendum)
-   Presents today for 1 month follow-up. She has been off Esbriet since June 2021. Reports increased shortness of breath x 1-2 weeks. O2 75% on RA, improved > 90% on 3L. She has been on oxygen in the past, she does not current have O2 at home. Recommend staying on 20mg  prednisone daily and resume Esbriet 1 tab three times a day x 1 week; then 2 tabs three times a day x 1 week then 3 tabs three times a day. Sending in new order for oxygen, advised her to wear 3L oxygen continuously and get pulse oximeter and monitor O2 at home if sustaining <88% on oxygen OR symptoms worsen please call EMS for ED evaluation. Need follow-up in 1 week. Discussed with Dr. Chase Caller and sent staff message to cardiology who will be scheduling her a visit.   Orders: - New oxygen start 3L/min [place] - Labs today (BNP and D-dimer) - Echocardiogram re: hypoxia, hx pericardial effusion - Needs to scheduled HRC re: ILD  Follow-up: - 1 week with MR or Beth NP

## 2020-06-13 NOTE — Evaluation (Signed)
Occupational Therapy Evaluation Patient Details Name: [name] MRN: [id number] DOB: [birth year] Today's Date: 06/13/2020    History of Present Illness Ms. Sandhu is a 44 year old woman with PMH of SLE with chronic ILD and chronic respiratory failure, DM2, HTN, Afib - paroxysmal and HFpEF chornic who presented with increase oxygen needs, worsening SOB and swelling.  She was recently started on prednisone for issues with her SLE and she also recently ran out of her metoprolol.  She presented with excess volume and in Afib with RVR which has improved with metoprolol dosing.   Clinical Impression   PTA, pt lives with husband and 68 y/o son. Pt reports Independence in all ADLs, iADLs and mobility without AD. Pt with recent hx of O2 use at home. Pt presents now close to baseline - Independent for mobility in hallway, toileting tasks and LB dressing tasks. SpO2 91% after mobility on 2 L O2, remains at 95% during seated activities. Educated on energy conservation strategies to implement during ADLs/IADLs with pt verbalizing understanding. No further OT needs indicated at this time. OT to sign off.     Follow Up Recommendations  No OT follow up    Equipment Recommendations  None recommended by OT    Recommendations for Other Services       Precautions / Restrictions Precautions Precautions: Fall Restrictions Weight Bearing Restrictions: No      Mobility Bed Mobility Overal bed mobility: Independent                  Transfers Overall transfer level: Independent Equipment used: None                  Balance Overall balance assessment: No apparent balance deficits (not formally assessed)                                         ADL either performed or assessed with clinical judgement   ADL Overall ADL's : Independent                                       General ADL Comments: Independent for going to bathroom, donning socks  and mobility in hallway without AD.      Vision Baseline Vision/History: No visual deficits Patient Visual Report: No change from baseline Vision Assessment?: No apparent visual deficits     Perception     Praxis      Pertinent Vitals/Pain Pain Assessment: No/denies pain     Hand Dominance Right   Extremity/Trunk Assessment Upper Extremity Assessment Upper Extremity Assessment: Overall WFL for tasks assessed   Lower Extremity Assessment Lower Extremity Assessment: Defer to PT evaluation   Cervical / Trunk Assessment Cervical / Trunk Assessment: Normal   Communication Communication Communication: No difficulties   Cognition Arousal/Alertness: Awake/alert Behavior During Therapy: WFL for tasks assessed/performed Overall Cognitive Status: Within Functional Limits for tasks assessed                                     General Comments  Pt received on 2 L O2, SpO2 95% at rest. After short distance mobility in halllway on 2 L o2, SpO2 91% with mild dyspnea reported but recovered to 95% within 1  minute of seated rest break. Educated on energy conservation strategies with pt verbalizing understanding    Exercises     Shoulder Instructions      Home Living Family/patient expects to be discharged to:: Private residence Living Arrangements: Children;Spouse/significant other Available Help at Discharge: Family;Available PRN/intermittently Type of Home: Apartment Home Access: Stairs to enter Entrance Stairs-Number of Steps: 2 flights Entrance Stairs-Rails: Right;Left Home Layout: One level     Bathroom Shower/Tub: Teacher, early years/pre: Standard     Home Equipment: None          Prior Functioning/Environment Level of Independence: Independent        Comments: Independent with ADLs, IADLs, grocery shopping and driving. no use of AD. Recent hx of using oxygen        OT Problem List:        OT Treatment/Interventions:      OT  Goals(Current goals can be found in the care plan section) Acute Rehab OT Goals Patient Stated Goal: go home OT Goal Formulation: With patient  OT Frequency:     Barriers to D/C:            Co-evaluation              AM-PAC OT "6 Clicks" Daily Activity     Outcome Measure Help from another person eating meals?: None Help from another person taking care of personal grooming?: None Help from another person toileting, which includes using toliet, bedpan, or urinal?: None Help from another person bathing (including washing, rinsing, drying)?: None Help from another person to put on and taking off regular upper body clothing?: None Help from another person to put on and taking off regular lower body clothing?: None 6 Click Score: 24   End of Session Equipment Utilized During Treatment: Oxygen Nurse Communication: Mobility status  Activity Tolerance: Patient tolerated treatment well Patient left: in bed;with call bell/phone within reach                   Time: 1340-1355 OT Time Calculation (min): 15 min Charges:  OT General Charges $OT Visit: 1 Visit OT Evaluation $OT Eval Low Complexity: 1 Low  Layla Maw, OTR/L  Layla Maw 06/13/2020, 2:10 PM

## 2020-06-13 NOTE — Discharge Summary (Signed)
Name: [name] MRN: [id number] DOB: [birth year] 44 y.o. PCP: Gildardo Pounds, NP  Date of Admission: 06/10/2020  9:46 AM Date of Discharge: 06/13/2020 Attending Physician: Axel Filler, *  Discharge Diagnosis: 1. HFpEF exacerbation 2. Paroxysmal atrial fibrillation 3. SLE 4. Type II DM  Discharge Medications: Allergies as of 06/13/2020      Reactions   Septra [bactrim] Hives   Sulfamethoxazole-trimethoprim Hives         Medication List    STOP taking these medications   ALKA-SELTZER PLUS COLD & COUGH PO     TAKE these medications   Accu-Chek Guide test strip Generic drug: glucose blood Use as instructed to check blood sugar twice daily. E11.65   acetaminophen 500 MG tablet Commonly known as: TYLENOL Take 1,000 mg by mouth every 6 (six) hours as needed for headache (pain).   albuterol 108 (90 Base) MCG/ACT inhaler Commonly known as: VENTOLIN HFA Inhale 2 puffs into the lungs every 6 (six) hours as needed for wheezing or shortness of breath.   aspirin EC 81 MG tablet Take 1 tablet (81 mg total) by mouth daily. Swallow whole.   azaTHIOprine 50 MG tablet Commonly known as: IMURAN Take 50 mg by mouth 2 (two) times daily.   Basaglar KwikPen 100 UNIT/ML Inject 0.2 mLs (20 Units total) into the skin daily. E11.65. may increase by 2 units every 3 days for fasting blood glucose readings greater than 130. What changed:   when to take this  additional instructions   colchicine 0.6 MG tablet Take 1 tablet (0.6 mg total) by mouth daily.   diltiazem 240 MG 24 hr capsule Commonly known as: CARDIZEM CD TAKE 1 CAPSULE BY MOUTH EVERY DAY What changed: how much to take   Eliquis 5 MG Tabs tablet Generic drug: apixaban TAKE 1 TABLET TWICE A DAY What changed: how much to take   Esbriet 267 MG Caps Generic drug: Pirfenidone Take 1 capsule by mouth in the morning, at noon, and at bedtime for 7 days, THEN 2 capsules in the morning, at noon, and at  bedtime for 7 days, THEN 3 capsules in the morning, at noon, and at bedtime for 14 days. Start taking on: June 08, 2020   FreeStyle Libre 2 Reader Kerrin Mo Use as instructed. Check blood glucose levels five times per day per day. E11.65 Z91.89   FreeStyle Libre 2 [employer] Use as instructed. Check blood glucose levels 5 times per day. E11.65 Z91.89   furosemide 40 MG tablet Commonly known as: LASIX Take 1 tablet (40 mg total) by mouth 2 (two) times daily.   guaiFENesin 600 MG 12 hr tablet Commonly known as: MUCINEX Take 1,200 mg by mouth every 12 (twelve) hours as needed for cough or to loosen phlegm.   hydroxychloroquine 200 MG tablet Commonly known as: PLAQUENIL Take 1 tablet (200 mg total) by mouth 2 (two) times daily.   insulin lispro 100 UNIT/ML KwikPen Commonly known as: HumaLOG KwikPen For blood sugars 0-150 give 0 units of insulin, 151-200 give 2 units of insulin, 201-250 give 4 units, 251-300 give 6 units, 301-350 give 8 units, 351-400 give 10 units,> 400 give 12 units and call office. Discussed hypoglycemia protocol. What changed:   how much to take  how to take this  when to take this  additional instructions   insulin lispro 100 UNIT/ML injection Commonly known as: HUMALOG Inject 0.08 mLs (8 Units total) into the skin 3 (three) times daily with meals. What changed:  You were already taking a medication with the same name, and this prescription was added. Make sure you understand how and when to take each.   losartan 50 MG tablet Commonly known as: COZAAR TAKE 1 TABLET BY MOUTH EVERY DAY   medroxyPROGESTERone 150 MG/ML injection Commonly known as: DEPO-PROVERA Inject 150 mg into the muscle every 3 (three) months.   metoprolol tartrate 50 MG tablet Commonly known as: LOPRESSOR Take 1 tablet (50 mg total) by mouth 2 (two) times daily.   ONE TOUCH DELICA LANCING DEV Misc 1 each by Does not apply route 3 (three) times daily.   OneTouch Delica Lancets 92Z  Misc 1 each by Does not apply route 3 (three) times daily.   OneTouch Verio w/Device Kit 1 each by Does not apply route 3 (three) times daily.   predniSONE 20 MG tablet Commonly known as: DELTASONE Take 1 tablet (20 mg total) by mouth daily with breakfast.   rosuvastatin 20 MG tablet Commonly known as: CRESTOR TAKE 1 TABLET BY MOUTH EVERY DAY AT 6PM What changed:   how much to take  how to take this  when to take this   TRUEplus Pen Needles 32G X 4 MM Misc Generic drug: Insulin Pen Needle Use as directed to inject insulin 5x daily   Trulicity 3.00 TM/2.2QJ Sopn Generic drug: Dulaglutide Inject 0.5 mLs (0.75 mg total) into the skin once a week. What changed: when to take this       Disposition and follow-up:   [name] was discharged from [facility] in Stable condition.  At the hospital follow up visit please address:  1.  a). HFpEF exacerbation: Continue Lasix 40 mg BID and get a BMP on follow up.   b). Paroxysmal atrial fibrillation: Continue metoprolol 50 mg BID, diltiazem 240 mg and Eliquis 5 mg BID. Spoke with Dr. Virgina Jock and he said adding an AAD for rhythm control can be discussed as outpatient during her upcoming follow up appointment.   c). SLE:  Continue her home medications of plaquenil, imuran and prednisone 57m daily  d). Type II DM: Continue Lantus 20 units, and  Novolog 8 units three times daily with meals, along with SSI-TID, and SSI- QHS, continue CGM monitoring.   2.  Labs / imaging needed at time of follow-up: BMP, HbA1c in 3 months   3.  Pending labs/ test needing follow-up: None  Follow-up Appointments:   1. Pulmonology- EGeraldo Pitter NP on 06/14/2020 @ 9 AM 2. Cardiology - PNigel Mormon MD on 06/22/2020 @ 2:45 pm 3. Gi-Bcg Mammography on 06/28/2020 @ 11:20 AM 4. Lbpu-Pulmonary Care- OLaurin Coder MD on 07/11/2020 @ 10:00 AM [facility]. [facility] F[facility]ildardo Pounds NP on 07/19/2020 @  8:30 AM 6. Pcv-Imaging- PCV-ECHO/VAS 1 on 10/11/2020 @ 8:30 AM 7. [facility] - PNigel Mormon MD on 10/19/2020 @ 8:30 AM   Hospital Course by problem list:  HFpEF exacerbation: On admission, her primary complaint was progressive worsening of shortness of breath. She appeared volume overloaded and was started on IV Lasix 40 mg twice daily. Renal function is stable. Echo from this admission with LVE 55-60% without RWMA; RV systolic function mildly reduced and moderate pericardial effusion that is stable from prior study in March 2021. She responded well to the diuresis and on exam appears to be close to euvolemic.  Our recommendation is to continue her lasix 40 mg BID at home and to follow up with her cardiologist, Dr.  Patwardhan.    Paroxysmal atrial fibrillation: On initial presentation to the ED, patient's heart rate was in the 140s. EKG confirmed atrial fibrillation with RVR. She was treated with metoprolol 50 mg TID which helped her reach rate control. Patient is on 50 mg metoprolol BID, diltiazem 240 mg daily, and Eliquis 5 mg BID. She is currently rate controlled. Consideration of an AAD for rhythm control might be beneficial for her. Our recommendation is for her to follow up with Dr. Virgina Jock for further discussion of management.    SLE: We continued her home medications of plaquenil, imuran and prednisone 22m daily.    Type II DM:  During admission, the patient CBG increased to 420. The patient was recently started on prednisone and since then has had poor control of her diabetes. Her most recent HbA1c is 11.1 measured in  September 2021. Current CBG's with postprandial hyperglycemia. Patient responded well with her home dose of Lantus 20 units, and the addition of Novolog 8 units three times daily with meals, along with SSI-TID, and SSI-QHS. Our diabetes coordinator met with her to discuss management. Our recommendation is to follow up with her PCP to discuss home  regimen.  Discharge Vitals:   BP 114/85 (BP Location: Left Arm)   Pulse 98   Temp 98.4 F (36.9 C) (Oral)   Resp 18   Ht 5' (1.524 m)   Wt 102.8 kg   SpO2 98%   BMI 44.26 kg/m   Pertinent Labs, Studies, and Procedures:  CBC Latest Ref Rng & Units 06/11/2020 06/10/2020 04/19/2020  WBC 4.0 - 10.5 K/uL 7.7 9.6 12.4(H)  Hemoglobin 12.0 - 15.0 g/dL 11.9(L) 11.6(L) 13.2  Hematocrit 36 - 46 % 39.2 38.0 43.1  Platelets 150 - 400 K/uL 493(H) 482(H) 312    BMP Latest Ref Rng & Units 06/13/2020 06/12/2020 06/11/2020  Glucose 70 - 99 mg/dL 266(H) 162(H) 152(H)  BUN 6 - 20 mg/dL 17 16 13   Creatinine 0.44 - 1.00 mg/dL 1.02(H) 0.90 0.89  BUN/Creat Ratio 9 - 23 - - -  Sodium 135 - 145 mmol/L 137 138 140  Potassium 3.5 - 5.1 mmol/L 3.6 3.6 3.5  Chloride 98 - 111 mmol/L 102 101 101  CO2 22 - 32 mmol/L 22 27 27   Calcium 8.9 - 10.3 mg/dL 9.2 9.2 9.2   Chest x-ray showed Mild diffuse interstitial thickening and hazy airspace opacities, suggestive of mild pulmonary edema, More dense left basilar consolidation may represent atelectasis and/or pneumonia, Similar enlarged cardiac silhouette, which may represent cardiomegaly and/or pericardial effusion in this patient with a history of pericarditis.   Echo: IMPRESSIONS on 06/10/2020  1. Left ventricular ejection fraction, by estimation, is 55 to 60%. The  left ventricle has normal function. The left ventricle has no regional  wall motion abnormalities. There is mild left ventricular hypertrophy.  Left ventricular diastolic parameters  are indeterminate.  2. Right ventricular systolic function is mildly reduced. The right  ventricular size is not well visualized.  3. Left atrial size was moderately dilated.  4. Right atrial size was mildly dilated.  5. Moderate pericardial effusion. The pericardial effusion is posterior  to the left ventricle and the left atrium. There is no evidence of cardiac  tamponade.  6. No significant change  compared to previous outpatient study in 10/2019.   Discharge Instructions: Discharge Instructions    (HEART FAILURE PATIENTS) Call MD:  Anytime you have any of the following symptoms: 1) 3 pound weight gain in 24 hours or 5  pounds in 1 week 2) shortness of breath, with or without a dry hacking cough 3) swelling in the hands, feet or stomach 4) if you have to sleep on extra pillows at night in order to breathe.   Complete by: As directed    Call MD for:  difficulty breathing, headache or visual disturbances   Complete by: As directed    Diet - low sodium heart healthy   Complete by: As directed    Discharge instructions   Complete by: As directed    [name] were admitted to the hospital for heart failure exacerbation and atrial fibrillation with rapid ventricular response. During your admission, we treated you with a diuretic to help decrease your fluid volume overload, and metoprolol to help with the atrial fibrillation rate control with improvement in your symptoms. When you return home, please follow up with Dr. Virgina Jock regarding management of your heart failure and atrial fibrillation to prevent future hospitalizations.  For your diabetes, please use the FreeStyle Libre for glucose monitoring. At this time, you may continue with Lantus 20U daily. For your meal time insulin, we started on Novolog 8U with meals + sliding scale insulin with improvement in your glucose. On discharge, please continue with this regimen. Please follow up with your PCP.  In the meantime, continue your current home medications as indicated in the discharge summary to help manage your symptoms.   Thank you for allowing [place] to take care of you.   Increase activity slowly   Complete by: As directed       Signed: Honor Junes, MD 06/13/2020, 3:32 PM   Pager: [phone]

## 2020-06-13 NOTE — Hospital Course (Signed)
Problems addressed during visit:   Ms. [name] is a 44 year old female with PMHx of HTN, DM, SLE, PAF, HFpE (EF 55-60%), moderate mitral regurgitation and chronic respiratory failure on 3L O2 at home admitted for HFpEF exacerbation.    HFpEF exacerbation: On admission, her primary complaint was progressive worsening of shortness of breath. She appeared volume overloaded and was started on IV Lasix 40 mg twice daily. Renal function is stable. Echo from this admission with LVE 55-60% without RWMA; RV systolic function mildly reduced and moderate pericardial effusion that is stable from prior study in March 2021. She responded well to the diuresis and on exam appears to be close to euvolemic.  Our recommendation is to continue her lasix 40 mg BID at home and to follow up with her cardiologist, Dr. Virgina Jock.    Paroxysmal atrial fibrillation: On initial presentation to the ED, patient's heart rate was in the 140s. EKG confirmed atrial fibrillation with RVR. She was treated with metoprolol 50 mg TID which helped her reach rate control. Patient is on 50 mg metoprolol BID, diltiazem 240 mg daily, and Eliquis 5 mg BID. She is currently rate controlled. Consideration of an AAD for rhythm control might be beneficial for her. Our recommendation is for her to follow up with Dr. Virgina Jock for further discussion of management.    SLE: We continued her home medications of plaquenil, imuran and prednisone 81m daily.    Type II DM:  During admission, the patient CBG increased to 420. The patient was recently started on prednisone and since then has had poor control of her diabetes. Her most recent HbA1c is 11.1 measured in  September 2021. Current CBG's with postprandial hyperglycemia. Patient responded well with her home dose of Lantus 20 units, and the addition of Novolog 8 units three times daily with meals, along with SSI-TID, and SSI-QHS. Our diabetes coordinator met with her to discuss management. Our  recommendation is to follow up with her PCP to discuss home regimen.

## 2020-06-13 NOTE — Progress Notes (Signed)
UPDATE NOTE:  Paged by bedside RN regarding issues related to discharge. Pt noting that her home oxygen tank was misplaced on admission. Case management has been assisting bedside RN with finding a replacement tank however pt is refusing a replacement unless it is the same as her current one.  Chart review indicates patient does have a supplemental oxygen requirement of 3L with ambulation.   Plan: Pt will need home oxygen tank prior to discharge. If replacement tank can not be located this evening, she will, unfortunately, need to stay until one is available. If replacement tank is located, it is ok to proceed with discharge. Plan discussed with bedside RN.  Mitzi Hansen, MD Internal Medicine Resident PGY-2 [facility] Pager: [phone] 06/13/2020 6:28 PM

## 2020-06-14 ENCOUNTER — Telehealth: Payer: Self-pay

## 2020-06-14 ENCOUNTER — Ambulatory Visit: Payer: 59 | Admitting: Primary Care

## 2020-06-14 NOTE — Telephone Encounter (Signed)
Transition Care Management Follow-up Telephone Call  Date of discharge and from where: 06/13/2020, [facility]   How have you been since you were released from the hospital? She stated she is feeling much better  Any questions or concerns? Yes - medication question regarding insulin reviewed and noted below.   Items Reviewed:  Did the pt receive and understand the discharge instructions provided? Yes   Medications obtained and verified? Yes she said she has all medications.  She wanted to confirm her orders for insulin. Reviewed her insulin orders with her after speaking with Acquanetta Belling [place]. She takes basaglar 20 units daily. She does not increase the dose by 2 units every 3 days for fasting blood sugar > 130 as noted on her AVS.   Humalog: she takes 8 units + sliding scale dose three times daily with  meals.  She said that she understands she needs to eat when taking the novolog.  No other questions about her med regime.   Any new allergies since your discharge? No   Do you have support at home? Yes , her husband and son  [place] and Equipment/Supplies: Were home health services ordered?  No Were any new equipment or medical supplies ordered?  Yes: she said she was given a [employer] and she keeps a log of her bloodsugars.  Her blood sugar this afternoon was 242. She also has home O2 that she uses continuously.  What is the name of the medical supply agency? [employer] [facility] [employer] Do you have any questions related to the use of the equipment or supplies? No  Functional Questionnaire: (I = Independent and D = Dependent) ADLs: independent  Follow up appointments reviewed:   PCP Hospital f/u appt confirmed? Yes  - Ms Raul Del, NP 06/21/2020  Specialist Hospital f/u appt confirmed? Yes  - cardiovascular- 06/22/2020;  pulmonary -07/11/2020  Are transportation arrangements needed? No   If their condition worsens, is the pt aware to call PCP or go to the Emergency Dept.?  yes  Was the patient provided with contact information for the PCP's office or ED?she has the clinic phone number  Was to pt encouraged to call back with questions or concerns?yes

## 2020-06-21 ENCOUNTER — Encounter: Payer: Self-pay | Admitting: Nurse Practitioner

## 2020-06-21 ENCOUNTER — Ambulatory Visit: Payer: 59 | Attending: Nurse Practitioner | Admitting: Nurse Practitioner

## 2020-06-21 ENCOUNTER — Other Ambulatory Visit: Payer: Self-pay | Admitting: Pharmacist

## 2020-06-21 ENCOUNTER — Other Ambulatory Visit: Payer: Self-pay

## 2020-06-21 VITALS — BP 123/78 | HR 103 | Temp 97.7°F | Ht 60.0 in | Wt 229.0 lb

## 2020-06-21 DIAGNOSIS — E1165 Type 2 diabetes mellitus with hyperglycemia: Secondary | ICD-10-CM

## 2020-06-21 DIAGNOSIS — M329 Systemic lupus erythematosus, unspecified: Secondary | ICD-10-CM

## 2020-06-21 DIAGNOSIS — I10 Essential (primary) hypertension: Secondary | ICD-10-CM

## 2020-06-21 LAB — GLUCOSE, POCT (MANUAL RESULT ENTRY)
POC Glucose: 341 mg/dl — AB (ref 70–99)
POC Glucose: 344 mg/dl — AB (ref 70–99)

## 2020-06-21 MED ORDER — INSULIN ASPART 100 UNIT/ML ~~LOC~~ SOLN
10.0000 [IU] | Freq: Once | SUBCUTANEOUS | Status: AC
  Administered 2020-06-21: 10 [IU] via SUBCUTANEOUS

## 2020-06-21 MED ORDER — TRULICITY 1.5 MG/0.5ML ~~LOC~~ SOAJ: 1.5000 mg | SUBCUTANEOUS | 3 refills | Status: DC

## 2020-06-21 MED ORDER — INSULIN LISPRO (1 UNIT DIAL) 100 UNIT/ML (KWIKPEN): 8.0000 [IU] | PEN_INJECTOR | Freq: Three times a day (TID) | SUBCUTANEOUS | 11 refills | Status: DC

## 2020-06-21 MED ORDER — BASAGLAR KWIKPEN 100 UNIT/ML ~~LOC~~ SOPN: 30.0000 [IU] | PEN_INJECTOR | Freq: Every day | SUBCUTANEOUS | 6 refills | Status: AC

## 2020-06-21 NOTE — Progress Notes (Signed)
Assessment & Plan:  [name] was seen today for hospitalization follow-up.  Diagnoses and all orders for this visit:  Type 2 diabetes mellitus with hyperglycemia, without long-term current use of insulin (HCC) -     Glucose (CBG) -     Basic metabolic panel -     Insulin Glargine (BASAGLAR KWIKPEN) 100 UNIT/ML; Inject 30 Units into the skin daily. E11.65. may increase by 2 units every 3 days for fasting blood glucose readings greater than 130. -     insulin lispro (HUMALOG KWIKPEN) 100 UNIT/ML KwikPen; Inject 8 Units into the skin 3 (three) times daily. For blood sugars 0-150 give 0 units of insulin, 151-200 give 2 units of insulin, 201-250 give 4 units, 251-300 give 6 units, 301-350 give 8 units, 351-400 give 10 units,> 400 give 12 units and call office. Discussed hypoglycemia protocol. -     Glucose (CBG) -     insulin aspart (novoLOG) injection 10 Units -     Dulaglutide (TRULICITY) 3 NO/7.0JG SOPN; Inject 3 mg as directed once a week.  Essential hypertension Continue all antihypertensives as prescribed.  Remember to bring in your blood pressure log with you for your follow up appointment.  DASH/Mediterranean Diets are healthier choices for HTN.    Lupus ([place]) -     Ambulatory referral to Rheumatology     Patient has been counseled on age-appropriate routine health concerns for screening and prevention. These are reviewed and up-to-date. Referrals have been placed accordingly. Immunizations are up-to-date or declined.    Subjective:   Chief Complaint  Patient presents with   Hospitalization Follow-up    Pt. is here for HFU.    [name] 44 y.o. female presents to office today for HFU She missed her Pulmonology appointment on 06-14-2020. She had just been discharged from the hospital on 06-13-2020 for HF exacerbation, PAF and DM.  HF: required IV lasix due to volume overload. Currently prescribed po lasix 40 mg BID. PAF: Treated with metoprolol 50 mg TID. Curently  taking metoprolol 50 mg BID, diltiazem 240 mg daily, and eliquis 5 mg BID. She has an appt with Cardiology tomorrow.  OSA: Sleep study consult on 07-11-20. Currently not using CPAP.  GEZ:MOQH is to wean off prednisone per Pulmonology. She is unable to follow up with rheumatology due to bill owed. Requires 3L supplemental O2.  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 Also her insurance plan does not want to cover her insulins (humalog and basaglar). We are working on trying to fix this. I have also asked her to reach out to her insurance company for other options. Will increase trulicity to 63m from 1.5.  Lab Results  Component Value Date   HGBA1C 11.8 (H) 06/11/2020  Blood pressure is well controlled.  BP Readings from Last 3 Encounters:  06/23/20 124/80  06/22/20 (!) 164/101  06/21/20 123/78    Review of Systems  Constitutional: Negative for fever, malaise/fatigue and weight loss.  HENT: Negative.  Negative for nosebleeds.   Eyes: Negative.  Negative for blurred vision, double vision and photophobia.  Respiratory: Positive for shortness of breath (with exertion). Negative for cough.   Cardiovascular: Negative.  Negative for chest pain, palpitations and leg swelling.  Gastrointestinal: Negative.  Negative for heartburn, nausea and vomiting.  Musculoskeletal: Negative.  Negative for myalgias.  Neurological: Negative.  Negative for dizziness, focal weakness, seizures and headaches.  Psychiatric/Behavioral: Negative.  Negative for suicidal ideas.    Past Medical History:  Diagnosis Date   Abnormal Pap smear of cervix    colpo, HPV   Allergy    Anemia    Atrial fibrillation (HCC)    CHF (congestive heart failure) (HCC)    Depression    after losses   Dyspnea    Enlarged heart    managed by cardiology   Fibroid     Gestational diabetes    Heart murmur    Hypertension    Infection    UTI   Lupus ([place]) 1999   Pericarditis     Past Surgical History:  Procedure Laterality Date   A-FLUTTER ABLATION N/A 06/10/2018   Procedure: A-FLUTTER ABLATION;  Surgeon: Evans Lance, MD;  Location: [facility];  Service: Cardiovascular;  Laterality: N/A;   CARDIAC CATHETERIZATION     CORONARY STENT INTERVENTION N/A 12/12/2018   Procedure: CORONARY STENT INTERVENTION;  Surgeon: [name], MD;  Location: [facility];  Service: Cardiovascular;  Laterality: N/A;  lad    RIGHT/LEFT HEART CATH AND CORONARY ANGIOGRAPHY N/A 03/14/2018   Procedure: RIGHT/LEFT HEART CATH AND CORONARY ANGIOGRAPHY;  Surgeon: [name], MD;  Location: [facility];  Service: Cardiovascular;  Laterality: N/A;   RIGHT/LEFT HEART CATH AND CORONARY ANGIOGRAPHY N/A 12/12/2018   Procedure: RIGHT/LEFT HEART CATH AND CORONARY ANGIOGRAPHY;  Surgeon: [name], MD;  Location: [facility];  Service: Cardiovascular;  Laterality: N/A;   THERAPEUTIC ABORTION      Family History  Problem Relation Age of Onset   Cancer Maternal Grandfather        lung   Diabetes Mother    Hypertension Mother    Hypertension Maternal Grandmother    Diabetes Father    Hearing loss Neg Hx     Social History Reviewed with no changes to be made today.   Outpatient Medications Prior to Visit  Medication Sig Dispense Refill   acetaminophen (TYLENOL) 500 MG tablet Take 1,000 mg by mouth every 6 (six) hours as needed for headache (pain).     albuterol (VENTOLIN HFA) 108 (90 Base) MCG/ACT inhaler Inhale 2 puffs into the lungs every 6 (six) hours as needed for wheezing or shortness of breath. 8 g 5   aspirin EC 81 MG tablet Take 1 tablet (81 mg total) by mouth daily. Swallow whole. 90 tablet 3   azaTHIOprine (IMURAN) 50 MG tablet Take 50 mg by mouth 2 (two) times daily.     Blood Glucose Monitoring  Suppl (ONETOUCH VERIO) w/Device KIT 1 each by Does not apply route 3 (three) times daily. 1 kit 0   Continuous Blood Gluc Receiver (FREESTYLE LIBRE 2 READER) DEVI Use as instructed. Check blood glucose levels five times per day per day. E11.65 Z91.89 1 each 1   Continuous Blood Gluc Sensor (FREESTYLE LIBRE 2 SENSOR) MISC Use as instructed. Check blood glucose levels 5 times per day. E11.65 Z91.89 1 each 6   diltiazem (CARDIZEM CD) 240 MG 24 hr capsule TAKE 1 CAPSULE BY MOUTH EVERY DAY (Patient taking differently: Take 240 mg by mouth daily. ) 90 capsule 2   ELIQUIS 5 MG TABS tablet TAKE 1 TABLET TWICE A DAY (Patient taking differently: Take 5 mg by mouth 2 (two) times daily. ) 180 tablet 2   furosemide (LASIX) 40 MG tablet Take 1 tablet (40 mg  total) by mouth 2 (two) times daily. 30 tablet 0   glucose blood (ACCU-CHEK GUIDE) test strip Use as instructed to check blood sugar twice daily. E11.65 100 each 6   guaiFENesin (MUCINEX) 600 MG 12 hr tablet Take 1,200 mg by mouth every 12 (twelve) hours as needed for cough or to loosen phlegm.     hydroxychloroquine (PLAQUENIL) 200 MG tablet Take 1 tablet (200 mg total) by mouth 2 (two) times daily. 60 tablet 3   Insulin Pen Needle (TRUEPLUS PEN NEEDLES) 32G X 4 MM MISC Use as directed to inject insulin 5x daily 200 each 3   Lancet Devices (ONE TOUCH DELICA LANCING DEV) MISC 1 each by Does not apply route 3 (three) times daily. 1 each 1   losartan (COZAAR) 50 MG tablet TAKE 1 TABLET BY MOUTH EVERY DAY (Patient taking differently: Take 50 mg by mouth daily. ) 90 tablet 3   medroxyPROGESTERone (DEPO-PROVERA) 150 MG/ML injection Inject 150 mg into the muscle every 3 (three) months.     OneTouch Delica Lancets 03U MISC 1 each by Does not apply route 3 (three) times daily. 100 each 12   predniSONE (DELTASONE) 20 MG tablet Take 1 tablet (20 mg total) by mouth daily with breakfast. 30 tablet 0   rosuvastatin (CRESTOR) 20 MG tablet TAKE 1 TABLET BY MOUTH  EVERY DAY AT 6PM (Patient taking differently: Take 20 mg by mouth daily at 6 PM. TAKE 1 TABLET BY MOUTH EVERY DAY AT 6PM) 90 tablet 1   Dulaglutide (TRULICITY) 8.82 CM/0.3KJ SOPN Inject 0.5 mLs (0.75 mg total) into the skin once a week. (Patient taking differently: Inject 0.75 mg into the skin every Thursday. ) 6 mL 1   Insulin Glargine (BASAGLAR KWIKPEN) 100 UNIT/ML Inject 0.2 mLs (20 Units total) into the skin daily. E11.65. may increase by 2 units every 3 days for fasting blood glucose readings greater than 130. (Patient taking differently: Inject 20 Units into the skin at bedtime. ) 6 mL 6   insulin lispro (HUMALOG KWIKPEN) 100 UNIT/ML KwikPen For blood sugars 0-150 give 0 units of insulin, 151-200 give 2 units of insulin, 201-250 give 4 units, 251-300 give 6 units, 301-350 give 8 units, 351-400 give 10 units,> 400 give 12 units and call office. Discussed hypoglycemia protocol. (Patient taking differently: Inject 2-10 Units into the skin See admin instructions. Inject 2-10 units subcutaneously three times daily with meals per sliding scale: CBG 0-150  0 units of insulin, 151-200 inject 2 units of insulin, 201-250  4 units, 251-300  6 units, 301-350  8 units, 351-400  10 units,> 400 12 units and call office. Discussed hypoglycemia protocol.) 15 mL 11   insulin lispro (HUMALOG) 100 UNIT/ML injection Inject 0.08 mLs (8 Units total) into the skin 3 (three) times daily with meals. 7.2 mL 0   metoprolol tartrate (LOPRESSOR) 50 MG tablet Take 1 tablet (50 mg total) by mouth 2 (two) times daily. 60 tablet 0   colchicine 0.6 MG tablet Take 1 tablet (0.6 mg total) by mouth daily. 60 tablet 1   ESBRIET 267 MG CAPS Take 1 capsule by mouth in the morning, at noon, and at bedtime for 7 days, THEN 2 capsules in the morning, at noon, and at bedtime for 7 days, THEN 3 capsules in the morning, at noon, and at bedtime for 14 days. 189 capsule 0   No facility-administered medications prior to visit.    Allergies   Allergen Reactions   Septra [Bactrim] Hives  Sulfamethoxazole-Trimethoprim Hives            Objective:    BP 123/78 (BP Location: Right Arm, Patient Position: Sitting, Cuff Size: Large)    Pulse (!) 103    Temp 97.7 F (36.5 C) (Temporal)    Ht 5' (1.524 m)    Wt 229 lb (103.9 kg)    SpO2 93%    BMI 44.72 kg/m  Wt Readings from Last 3 Encounters:  06/23/20 230 lb 3.2 oz (104.4 kg)  06/22/20 228 lb (103.4 kg)  06/21/20 229 lb (103.9 kg)    Physical Exam Vitals and nursing note reviewed.  Constitutional:      Appearance: She is well-developed.  HENT:     Head: Normocephalic and atraumatic.  Cardiovascular:     Rate and Rhythm: Regular rhythm. Tachycardia present.     Heart sounds: Murmur heard.  No friction rub. No gallop.   Pulmonary:     Effort: Pulmonary effort is normal. No tachypnea or respiratory distress.     Breath sounds: Normal breath sounds. No decreased breath sounds, wheezing, rhonchi or rales.  Chest:     Chest wall: No tenderness.  Abdominal:     General: Bowel sounds are normal.     Palpations: Abdomen is soft.  Musculoskeletal:        General: Normal range of motion.     Cervical back: Normal range of motion.  Skin:    General: Skin is warm and dry.  Neurological:     Mental Status: She is alert and oriented to person, place, and time.     Coordination: Coordination normal.  Psychiatric:        Behavior: Behavior normal. Behavior is cooperative.        Thought Content: Thought content normal.        Judgment: Judgment normal.          Patient has been counseled extensively about nutrition and exercise as well as the importance of adherence with medications and regular follow-up. The patient was given clear instructions to go to ER or return to medical center if symptoms don't improve, worsen or new problems develop. The patient verbalized understanding.   Follow-up: Return for has appt already with me in December. BRING METER.   Gildardo Pounds, FNP-BC [facility] [place], [place]   06/26/2020, 9:30 PM

## 2020-06-22 ENCOUNTER — Ambulatory Visit: Payer: 59 | Admitting: Cardiology

## 2020-06-22 ENCOUNTER — Encounter: Payer: Self-pay | Admitting: Cardiology

## 2020-06-22 VITALS — BP 164/101 | HR 89 | Resp 16 | Ht 60.0 in | Wt 228.0 lb

## 2020-06-22 DIAGNOSIS — I2510 Atherosclerotic heart disease of native coronary artery without angina pectoris: Secondary | ICD-10-CM

## 2020-06-22 DIAGNOSIS — I350 Nonrheumatic aortic (valve) stenosis: Secondary | ICD-10-CM

## 2020-06-22 DIAGNOSIS — I313 Pericardial effusion (noninflammatory): Secondary | ICD-10-CM

## 2020-06-22 DIAGNOSIS — I5032 Chronic diastolic (congestive) heart failure: Secondary | ICD-10-CM | POA: Insufficient documentation

## 2020-06-22 DIAGNOSIS — I480 Paroxysmal atrial fibrillation: Secondary | ICD-10-CM

## 2020-06-22 DIAGNOSIS — I3139 Other pericardial effusion (noninflammatory): Secondary | ICD-10-CM

## 2020-06-22 LAB — BASIC METABOLIC PANEL
BUN/Creatinine Ratio: 13 (ref 9–23)
BUN: 11 mg/dL (ref 6–24)
CO2: 25 mmol/L (ref 20–29)
Calcium: 9.5 mg/dL (ref 8.7–10.2)
Chloride: 98 mmol/L (ref 96–106)
Creatinine, Ser: 0.87 mg/dL (ref 0.57–1.00)
GFR calc Af Amer: 94 mL/min/{1.73_m2} (ref >59)
GFR calc non Af Amer: 81 mL/min/{1.73_m2} (ref >59)
Glucose: 323 mg/dL — ABNORMAL HIGH (ref 65–99)
Potassium: 3.5 mmol/L (ref 3.5–5.2)
Sodium: 140 mmol/L (ref 134–144)

## 2020-06-22 MED ORDER — METOPROLOL TARTRATE 100 MG PO TABS: 100.0000 mg | ORAL_TABLET | Freq: Two times a day (BID) | ORAL | 3 refills | Status: AC

## 2020-06-22 NOTE — Progress Notes (Addendum)
Subjective:   [name], female    DOB: Jun 04, 1976, 44 y.o.   MRN: [id number]   Chief complaint:  Shortness of breath  HPI  44 year old female with long-standing SLE, hypertension, type 2 diabetes mellitus, CAD s/p pLAD PCI for critical stenosis (12/12/2018), paroxysmal Afib w/RVR, moderate aortic stenosis, stable pericardial effusion without tamponade or constriction, interstitial lung disease.  Patient was hospitalized 05/2020 with complaints of shortness of breath and was found to be in paroxysmal A. fib with RVR and HFpEF exacerbation.  She was treated with IV Lasix, metoprolol.  Prior to this, was noted to hypoxia during her pulmonology visit.  She was taken off her antifibrotic's during hospital admission in June in [place].  Since then, she has been on tapering doses of prednisone.  Since hospital discharge on 06/13/2020, she has been feeling much better.  Neck swelling is completely resolved.  She is not had any worsening dyspnea symptoms.  Reviewed hospital echocardiogram.  No change in pericardial effusion size and physiology.  Current Outpatient Medications on File Prior to Visit  Medication Sig Dispense Refill   acetaminophen (TYLENOL) 500 MG tablet Take 1,000 mg by mouth every 6 (six) hours as needed for headache (pain).     albuterol (VENTOLIN HFA) 108 (90 Base) MCG/ACT inhaler Inhale 2 puffs into the lungs every 6 (six) hours as needed for wheezing or shortness of breath. 8 g 5   aspirin EC 81 MG tablet Take 1 tablet (81 mg total) by mouth daily. Swallow whole. 90 tablet 3   azaTHIOprine (IMURAN) 50 MG tablet Take 50 mg by mouth 2 (two) times daily.     Blood Glucose Monitoring Suppl (ONETOUCH VERIO) w/Device KIT 1 each by Does not apply route 3 (three) times daily. 1 kit 0   colchicine 0.6 MG tablet Take 1 tablet (0.6 mg total) by mouth daily. (Patient not taking: Reported on 06/10/2020) 60 tablet 1   Continuous Blood Gluc Receiver (FREESTYLE LIBRE 2  READER) DEVI Use as instructed. Check blood glucose levels five times per day per day. E11.65 Z91.89 1 each 1   Continuous Blood Gluc Sensor (FREESTYLE LIBRE 2 SENSOR) MISC Use as instructed. Check blood glucose levels 5 times per day. E11.65 Z91.89 1 each 6   diltiazem (CARDIZEM CD) 240 MG 24 hr capsule TAKE 1 CAPSULE BY MOUTH EVERY DAY (Patient taking differently: Take 240 mg by mouth daily. ) 90 capsule 2   Dulaglutide (TRULICITY) 1.5 JQ/3.0SP SOPN Inject 1.5 mg into the skin once a week. 6 mL 3   ELIQUIS 5 MG TABS tablet TAKE 1 TABLET TWICE A DAY (Patient taking differently: Take 5 mg by mouth 2 (two) times daily. ) 180 tablet 2   ESBRIET 267 MG CAPS Take 1 capsule by mouth in the morning, at noon, and at bedtime for 7 days, THEN 2 capsules in the morning, at noon, and at bedtime for 7 days, THEN 3 capsules in the morning, at noon, and at bedtime for 14 days. (Patient not taking: Reported on 06/21/2020) 189 capsule 0   furosemide (LASIX) 40 MG tablet Take 1 tablet (40 mg total) by mouth 2 (two) times daily. 30 tablet 0   glucose blood (ACCU-CHEK GUIDE) test strip Use as instructed to check blood sugar twice daily. E11.65 100 each 6   guaiFENesin (MUCINEX) 600 MG 12 hr tablet Take 1,200 mg by mouth every 12 (twelve) hours as needed for cough or to loosen phlegm.     hydroxychloroquine (PLAQUENIL)  200 MG tablet Take 1 tablet (200 mg total) by mouth 2 (two) times daily. 60 tablet 3   Insulin Glargine (BASAGLAR KWIKPEN) 100 UNIT/ML Inject 30 Units into the skin daily. E11.65. may increase by 2 units every 3 days for fasting blood glucose readings greater than 130. 6 mL 6   insulin lispro (HUMALOG KWIKPEN) 100 UNIT/ML KwikPen Inject 8 Units into the skin 3 (three) times daily. For blood sugars 0-150 give 0 units of insulin, 151-200 give 2 units of insulin, 201-250 give 4 units, 251-300 give 6 units, 301-350 give 8 units, 351-400 give 10 units,> 400 give 12 units and call office. Discussed  hypoglycemia protocol. 15 mL 11   Insulin Pen Needle (TRUEPLUS PEN NEEDLES) 32G X 4 MM MISC Use as directed to inject insulin 5x daily 200 each 3   Lancet Devices (ONE TOUCH DELICA LANCING DEV) MISC 1 each by Does not apply route 3 (three) times daily. 1 each 1   losartan (COZAAR) 50 MG tablet TAKE 1 TABLET BY MOUTH EVERY DAY (Patient taking differently: Take 50 mg by mouth daily. ) 90 tablet 3   medroxyPROGESTERone (DEPO-PROVERA) 150 MG/ML injection Inject 150 mg into the muscle every 3 (three) months.     metoprolol tartrate (LOPRESSOR) 50 MG tablet Take 1 tablet (50 mg total) by mouth 2 (two) times daily. 60 tablet 0   OneTouch Delica Lancets 20B MISC 1 each by Does not apply route 3 (three) times daily. 100 each 12   predniSONE (DELTASONE) 20 MG tablet Take 1 tablet (20 mg total) by mouth daily with breakfast. 30 tablet 0   rosuvastatin (CRESTOR) 20 MG tablet TAKE 1 TABLET BY MOUTH EVERY DAY AT 6PM (Patient taking differently: Take 20 mg by mouth daily at 6 PM. TAKE 1 TABLET BY MOUTH EVERY DAY AT 6PM) 90 tablet 1   [DISCONTINUED] insulin aspart (NOVOLOG FLEXPEN) 100 UNIT/ML FlexPen For blood sugars 0-150 give 0 units of insulin, 151-200 give 2 units of insulin, 201-250 give 4 units, 251-300 give 6 units, 301-350 give 8 units, 351-400 give 10 units,> 400 give 12 units and call M.D. Discussed hypoglycemia protocol. 15 mL 3   No current facility-administered medications on file prior to visit.    Cardiovascular studies:  EKG 06/22/2020: Sinus rhythm 99 bpm with multiform PAC's Leftward axis Poor R wave progression Nonspecific T-abnormality   Echocardiogram 06/10/2020: 1. Left ventricular ejection fraction, by estimation, is 55 to 60%. The  left ventricle has normal function. The left ventricle has no regional  wall motion abnormalities. There is mild left ventricular hypertrophy.  Left ventricular diastolic parameters  are indeterminate.  2. Right ventricular systolic function  is mildly reduced. The right  ventricular size is not well visualized.  3. Left atrial size was moderately dilated.  4. Right atrial size was mildly dilated.  5. Moderate pericardial effusion. The pericardial effusion is posterior  to the left ventricle and the left atrium. There is no evidence of cardiac  tamponade.  6. No significant change compared to previous outpatient study in 10/2019.   EKG 04/22/2020: Sinus tachycardia 100 bpm Right axis deviation First degree AV block Occasional PAC/PVC Low voltage   Echocardiogram 01/12/2020 (External): Concentric LVH with abnormal diastolic function and left atrial  enlargement.  Normal LVEF without significant wall motion abnormalities. Aortic valve  sclerosis with estimated moderate stenosis  Right heart enlargement/dysfunction with estimated moderately elevated  pulmonary artery systolic pressure  Small circumferential pericardial effusion without frank tamponade  Mildly dilated ascending  aorta.   CT chest 01/12/2020: Sub-optimal examination for segmental and subsegmental pulmonary embolism due to respiratory motion artifact.  No evidence of pulmonary embolism.  Extensive bilateral patchy and groundglass lung opacification, likely multifocal infection/inflammationwith underlying mild edema. Follow-up chest imaging should be performed as clinically warranted.  Moderate to large pericardial effusion.  Cardiomegaly. Mild pulmonary artery enlargement. Reflux of IV contrast into the IVC and hepatic veins, suggestive of elevated right heart pressures.  Aortic valve calcification. Moderate left-sided coronary artery calcifications. These findings are advanced for patient's age. Clinical correlation is recommended.  Multiple borderline sized to mildly enlarged mediastinal and hilar lymph nodes, suspected to be reactive.    EKG 12/12/2018: Sinus rhythm, aberrant conducted PAC's  R/LHC, coronary angiography and intervention 12/12/2018: RA: 10  mmHg RV: 46/7 mmHg. RVEDP 8 mmHg PA: 48/22 mmHg. Mean PA 33 mmHg PW: 17 mmHg LV 155/7 mmHg. LVEDP 10 mmHg <10 mmHg LV-Ao pullback gradient CO 7.8 L/min. CI 4 L/min/m2  Simultaneous RV-LV pressure tracings do not show interdependence to suggest constriction physiology.  LM: Wall calcium with no significant stenosis LAD: Ostial-proximal 90% stenosis. Normal diagonal branches. Successful PTCA and stent placement pLAD Synergy DES 3.5X16 mm DES Post dilatation with 4.0 X 8 mm [place] balloon. 0% residual stenosis Ramus: Normal LCx: Normal RCA: Normal   Recent labs: 06/21/2020: Glucose 323, BUN/Cr 11/0.87. EGFR 94. Na/K 140/3.5  Results for EIRENE, RATHER (MRN [id number]) as of 06/22/2020 14:58  Ref. Range 07/06/2019 16:55 07/06/2019 19:46 06/10/2020 10:40 06/10/2020 13:07  Troponin I (High Sensitivity) Latest Ref Range: <18 ng/L 36 (H) 40 (H) 24 (H) 20 (H)    06/11/2020: H/H 11.9/39.2. MCV 78.7. Platelets 493   04/19/2020: Glucose 415, BUN/Cr 19/1.07. EGFR 73. Na/K 137/3.9. Rest of the CMP normal H/H 13/43. MCV 83. Platelets 312 HbA1C 11.1% Chol 187, TG 221, HDL 72, LDL 79  09/07/2019: Glucose 99, BUN/Cr 10/0.76. EGFR 110. Na/K 142/3.3. Rest of the CMP normal H/H 11/34. MCV 79. Platelets 337 HbA1C 6.8%  Results for LAKERA, VIALL (MRN [id number]) as of 10/23/2019 05:45  Ref. Range 04/13/2019 14:25 04/13/2019 16:07 07/06/2019 16:55 07/06/2019 19:46  Troponin I (High Sensitivity) Latest Ref Range: <18 ng/L 42 (H) 44 (H) 36 (H) 40 (H)    Review of Systems  Cardiovascular: Negative for chest pain, dyspnea on exertion, leg swelling, palpitations and syncope.        Vitals:   06/22/20 1442  BP: (!) 179/101  Pulse: 99  Resp: 16  SpO2: 90%    Physical Exam Vitals and nursing note reviewed.  Constitutional:      General: She is not in acute distress. Neck:     Vascular: No JVD.  Cardiovascular:     Rate and Rhythm: Normal rate and regular rhythm. Frequent  extrasystoles are present.    Heart sounds: Murmur heard.  Harsh midsystolic murmur is present with a grade of 2/6 at the upper right sternal border radiating to the neck.   Pulmonary:     Effort: Pulmonary effort is normal.     Breath sounds: Normal breath sounds. No wheezing or rales.           Assessment & Recommendations:   44 year old female with long-standing SLE, hypertension, type 2 diabetes mellitus, CAD s/p pLAD PCI for critical stenosis (12/12/2018), paroxysmal Afib w/RVR, moderate aortic stenosis, stable pericardial effusion without tamponade or constriction (Cath 12/2018), interstitial lung disease  1. Paroxysmal atrial fibrillation (HCC) Currently in sinus rhythm with multiple PACs.  Recent hospitalization with  paroxysmal A. fib with RVR.  Unfortunately, given her severe lung disease, CAD, as well as HFpEF, her options for antiarrhythmic therapy are limited.  Ablation is also not an attractive option given her multiform PACs.  Continue medical management.  Increase metoprolol tartrate to 100 mg twice daily, continue diltiazem 240 mg daily. CHA2DS2VASc score 3.  Continue eliquis 5 mg bid  2. Coronary artery disease involving native coronary artery of native heart without angina pectoris S/p pLAD PCI. Recommend Aspirin 81 mg No angina symptoms. Continue Crestor 20 mg.   3. Nonrheumatic aortic valve stenosis Stable.  Repeat echocardiogram in 1 year.  4. Pericardial effusion, HFpEF Unchanged moderate sized, predominantly posteriorly loculated pericardial effusion since 2018.  She does not have any tamponade on echocardiogram.  Constrictive pericarditis remains in the differential, although not seen on cardiac catheterization in 2019.  Constrictive pericarditis would not explain her severe hypoxia.  If she continues to have recurrent admissions with heart failure exacerbation, I will consider right and left heart catheterization to further evaluate for obstructive  pericarditis.  Even if she did have constrictive pericarditis, I remain skeptical about her candidacy for a pericardiectomy, owing to her severe underlying lung disease.  Follow-up in 4 weeks to re-evaluate rate control.  [name], MD [facility] Pager: [phone] Office: [phone] If no answer Cell [phone]

## 2020-06-22 NOTE — Progress Notes (Signed)
_0  ID: [name], female    DOB: 12/31/1975, 44 y.o.   MRN: [id number]  Chief Complaint  Patient presents with   Follow-up    cough improved    Referring provider: Gildardo Pounds, NP  HPI:  44 year old female, never smoked. PMH significant for ILD, lupus, afib, CHF, pericardial effusion. Patient of Dr. Chase Caller.   Patient recently admitted at outside [facility] in June 1-6th with significant dyspnea, nonproductive cough and orthopnea.  Initial CT angio chest negative for PE but showed multifocal involvement with extensive bilateral patchy groundglass lung opacification.  Autoimmune process versus atypical infection on differential with underlying pulmonary edema.  Pulmonary and rheumatology were consulted.  Patient received IV steroids, Lasix and broad-spectrum antibiotics.  She eventually improved.  Labs were remarkable for elevated ANA titers, nuclear pattern, anti-RNP and SM/R NP space AB which was consistent with flare of lupus and MC TD.  Patient improved with steroid taper. She was discharged on Prednisone 68m x 5 days (to finish 1 week); then reduced to 227mx 1 week; then 1044m 1 week; then 5mg64m1 week then stop. She required 1 L of oxygen with ambulation on day of discharge.  Significant medication change; Started on torsemide 20 mg twice daily, increase metoprolol 100 mg twice daily and increase losartan 100 mg daily. Stopped lasix and Eliquis stopped. Esbriet on hold until follow-up with pulmonary.  Previous [facility] pulmonary encounter: Katherine Almasy26. -presents for follow-up of her multiple issues particularly ILD.  She has been off prednisone since October 2020.  She was continuing pirfenidone I last had a telephone visit with her in spring 2021.  After that she [place] in [place].  [facility]  At the discharge she was given prednisone taper to off.  She [place]  [place] taper to off.  However she ended up seeing my nurse practitioner in August 2021 with another respiratory flare and she is on a third prednisone taper in 3 months.  This taper ends in a few days.  She says every time she takes prednisone she starts feeling better.  Overall she feels her health is much more stable than June 2021 when she was hospitalized.  She also feels is stable similar to last year.  However ILD symptom questionnaire shows significant worsening of her symptoms compared to even earlier this year.  Pulmonary function test done recently shows decline.  Her last CT scan of the chest was in October 2020.  She is asking if [facility] for [facility] opinion.  I have left it to her choice.  She is known to have high risk for sleep apnea.  She missed seeing our sleep doctors here.  She continues on Imuran and Plaquenil  06/07/2020 Patient presents today for 1 month follow-up. During last visit she was restarted on pirfenidone. Ordered for HRCT in 4 weeks. Complains of shortness of breath since October 12th. She was sent in RX for prednisone taper. She took last dose 10mg40mdnisone today. O2 was 75% on RA today, this improved to 96% on 3L. She has not started Esbriet yet. She last saw rheumatology in September 2021. She is currently in-active with them d/t bill. She has 801mg 35miet at home, she does not have lower dose to start taper. She  has had oxygen at home before but does not currently. She has been having diarrhea and muscle aches. Last covid shot was in May. Cardiologist is Dr. Francee Piccolo.    Simple office walk 185 feet x  3 laps goal with forehead probe 06/11/2019  08/19/2019 Esbreit 04/28/2020 Off esbriet since June 2021 06/07/2020 Off Esbriet since June 2021  O2 used RA RA  75% RA ; 96% 3L  Number laps completed _0 Lap  Comments about pace Nl pace Slow pace due to arthralgia     Resting Pulse Ox/HR 98% and 104/min 100% and 102/min 99% and 66/min   Final Pulse Ox/HR 96% and 127/min 98% and 118/min 90% and 92/mn   Desaturated </= 88% no no    Desaturated <= 3% points no no    Got Tachycardic >/= 90/min yes yes    Symptoms at end of test Very mild dyspnea Mild dyspnea dyspneic Dyspneic   Miscellaneous comments *feeling better        06/23/2020- Interim hx Patient presents today for 1-2 week follow-up. Since last visit she was admitted to the hospital for 3 days for afib, HF exacerbation. She is feeling a lot better. Breathing has improved; she is not experiencing as much dyspnea on exertion and not coughing as much as she was previously. She also reports sleeping better and less swelling. She is still using oxygen at home. Her O2 level has between 89-90% RA.  She was given a scale at her apt yesterday with cardiology. She is maintained on lasix 17m twice daily, Metoprolol 544mBID, diltiazem 24080mnd Eliquis 5mg73mD. Remains on Plaquenil, imuran for Lupus. She has not re-started Esbriet, [facility]. She has been off anti-fibrotics since June 2021. She is still taking prednisone 20mg76mly. She has a sleep consult with Dr. OlaleAnder Sladeduled for 07/11/20 at 10am. She will follow-up with cardiology in 1 month after metoprolol dose was adjusted.    Allergies  Allergen Reactions   Septra [Bactrim] Hives   Sulfamethoxazole-Trimethoprim Hives         Immunization History  Administered Date(s) Administered   Influenza,inj,Quad PF,6+ Mos 05/25/2013, 05/14/2018, 05/13/2019   PFIZER SARS-COV-2 Vaccination 11/20/2019, 12/15/2019   Pneumococcal Polysaccharide-23 05/14/2018   Tdap 07/09/2018    Past Medical History:  Diagnosis Date   Abnormal Pap smear of cervix    colpo, HPV   Allergy    Anemia    Atrial fibrillation (HCC)    CHF (congestive heart failure) [place]HCC[place]   Dyspnea     Enlarged heart    managed by cardiology   Fibroid    Gestational diabetes    Heart murmur    Hypertension    Infection    UTI   Lupus (HCC) [id number]   Pericarditis     Tobacco History: Social History   Tobacco Use  Smoking Status Never Smoker  Smokeless Tobacco Never Used   Counseling given: Not Answered   Outpatient Medications Prior to Visit  Medication Sig Dispense Refill   acetaminophen (TYLENOL) 500 MG tablet Take 1,000 mg by mouth every 6 (six) hours as needed for headache (pain).     albuterol (VENTOLIN HFA) 108 (90 Base) MCG/ACT inhaler Inhale 2 puffs into the lungs every 6 (six) hours as needed for wheezing or shortness of breath. 8 g 5   aspirin EC 81 MG tablet Take 1 tablet (81 mg total) by mouth  daily. Swallow whole. 90 tablet 3   azaTHIOprine (IMURAN) 50 MG tablet Take 50 mg by mouth 2 (two) times daily.     Blood Glucose Monitoring Suppl (ONETOUCH VERIO) w/Device KIT 1 each by Does not apply route 3 (three) times daily. 1 kit 0   colchicine 0.6 MG tablet Take 1 tablet (0.6 mg total) by mouth daily. 60 tablet 1   Continuous Blood Gluc Receiver (FREESTYLE LIBRE 2 READER) DEVI Use as instructed. Check blood glucose levels five times per day per day. E11.65 Z91.89 1 each 1   Continuous Blood Gluc Sensor (FREESTYLE LIBRE 2 SENSOR) MISC Use as instructed. Check blood glucose levels 5 times per day. E11.65 Z91.89 1 each 6   diltiazem (CARDIZEM CD) 240 MG 24 hr capsule TAKE 1 CAPSULE BY MOUTH EVERY DAY (Patient taking differently: Take 240 mg by mouth daily. ) 90 capsule 2   Dulaglutide (TRULICITY) 1.5 YY/4.8GN SOPN Inject 1.5 mg into the skin once a week. 6 mL 3   ELIQUIS 5 MG TABS tablet TAKE 1 TABLET TWICE A DAY (Patient taking differently: Take 5 mg by mouth 2 (two) times daily. ) 180 tablet 2   furosemide (LASIX) 40 MG tablet Take 1 tablet (40 mg total) by mouth 2 (two) times daily. 30 tablet 0   glucose blood (ACCU-CHEK GUIDE) test strip Use as  instructed to check blood sugar twice daily. E11.65 100 each 6   guaiFENesin (MUCINEX) 600 MG 12 hr tablet Take 1,200 mg by mouth every 12 (twelve) hours as needed for cough or to loosen phlegm.     hydroxychloroquine (PLAQUENIL) 200 MG tablet Take 1 tablet (200 mg total) by mouth 2 (two) times daily. 60 tablet 3   Insulin Glargine (BASAGLAR KWIKPEN) 100 UNIT/ML Inject 30 Units into the skin daily. E11.65. may increase by 2 units every 3 days for fasting blood glucose readings greater than 130. 6 mL 6   insulin lispro (HUMALOG KWIKPEN) 100 UNIT/ML KwikPen Inject 8 Units into the skin 3 (three) times daily. For blood sugars 0-150 give 0 units of insulin, 151-200 give 2 units of insulin, 201-250 give 4 units, 251-300 give 6 units, 301-350 give 8 units, 351-400 give 10 units,> 400 give 12 units and call office. Discussed hypoglycemia protocol. 15 mL 11   Insulin Pen Needle (TRUEPLUS PEN NEEDLES) 32G X 4 MM MISC Use as directed to inject insulin 5x daily 200 each 3   Lancet Devices (ONE TOUCH DELICA LANCING DEV) MISC 1 each by Does not apply route 3 (three) times daily. 1 each 1   losartan (COZAAR) 50 MG tablet TAKE 1 TABLET BY MOUTH EVERY DAY (Patient taking differently: Take 50 mg by mouth daily. ) 90 tablet 3   medroxyPROGESTERone (DEPO-PROVERA) 150 MG/ML injection Inject 150 mg into the muscle every 3 (three) months.     metoprolol tartrate (LOPRESSOR) 100 MG tablet Take 1 tablet (100 mg total) by mouth 2 (two) times daily. 180 tablet 3   OneTouch Delica Lancets 00B MISC 1 each by Does not apply route 3 (three) times daily. 100 each 12   predniSONE (DELTASONE) 20 MG tablet Take 1 tablet (20 mg total) by mouth daily with breakfast. 30 tablet 0   rosuvastatin (CRESTOR) 20 MG tablet TAKE 1 TABLET BY MOUTH EVERY DAY AT 6PM (Patient taking differently: Take 20 mg by mouth daily at 6 PM. TAKE 1 TABLET BY MOUTH EVERY DAY AT 6PM) 90 tablet 1   ESBRIET 267 MG CAPS Take 1 capsule  by mouth in the  morning, at noon, and at bedtime for 7 days, THEN 2 capsules in the morning, at noon, and at bedtime for 7 days, THEN 3 capsules in the morning, at noon, and at bedtime for 14 days. 189 capsule 0   No facility-administered medications prior to visit.   Review of Systems  Review of Systems  Constitutional: Negative.   Respiratory: Positive for cough and shortness of breath.        Mild dyspnea   Physical Exam  BP 124/80 (BP Location: Left Arm, Cuff Size: Normal)    Pulse 94    Temp 98.1 F (36.7 C)    Ht 5' (1.524 m)    Wt 230 lb 3.2 oz (104.4 kg)    SpO2 94%    BMI 44.96 kg/m  Physical Exam Constitutional:      Appearance: Normal appearance.  HENT:     Head:     Comments: Facial/neck swelling improved Cardiovascular:     Rate and Rhythm: Normal rate and regular rhythm.     Comments: Trace BLE edema Pulmonary:     Effort: Pulmonary effort is normal.     Breath sounds: Rales present.  Skin:    General: Skin is warm and dry.  Neurological:     General: No focal deficit present.     Mental Status: She is alert and oriented to person, place, and time. Mental status is at baseline.  Psychiatric:        Mood and Affect: Mood normal.        Behavior: Behavior normal.        Thought Content: Thought content normal.        Judgment: Judgment normal.      Lab Results:  CBC    Component Value Date/Time   WBC 7.7 06/11/2020 0051   RBC 4.98 06/11/2020 0051   HGB 11.9 (L) 06/11/2020 0051   HGB 13.2 04/19/2020 1431   HCT 39.2 06/11/2020 0051   HCT 43.1 04/19/2020 1431   PLT 493 (H) 06/11/2020 0051   PLT 312 04/19/2020 1431   MCV 78.7 (L) 06/11/2020 0051   MCV 83 04/19/2020 1431   MCH 23.9 (L) 06/11/2020 0051   MCHC 30.4 06/11/2020 0051   RDW 16.8 (H) 06/11/2020 0051   RDW 16.5 (H) 04/19/2020 1431   LYMPHSABS 1.1 06/11/2020 0051   LYMPHSABS 0.6 (L) 03/05/2019 1433   MONOABS 0.6 06/11/2020 0051   EOSABS 0.0 06/11/2020 0051   EOSABS 0.1 03/05/2019 1433   BASOSABS 0.0  06/11/2020 0051   BASOSABS 0.0 03/05/2019 1433    BMET    Component Value Date/Time   NA 140 06/21/2020 1152   K 3.5 06/21/2020 1152   CL 98 06/21/2020 1152   CO2 25 06/21/2020 1152   GLUCOSE 323 (H) 06/21/2020 1152   GLUCOSE 266 (H) 06/13/2020 0810   BUN 11 06/21/2020 1152   CREATININE 0.87 06/21/2020 1152   CALCIUM 9.5 06/21/2020 1152   GFRNONAA 81 06/21/2020 1152   GFRNONAA >60 06/13/2020 0810   GFRAA 94 06/21/2020 1152    BNP    Component Value Date/Time   BNP 210.1 (H) 06/10/2020 1040    ProBNP    Component Value Date/Time   PROBNP 69.0 08/18/2008 1430    Imaging: DG Chest Portable 1 View  Result Date: 06/10/2020 CLINICAL DATA:  Shortness of breath. History AFib, CHF, diabetes, hypertension, enlarged heart, lupus, pericarditis. EXAM: PORTABLE CHEST 1 VIEW COMPARISON:  04/05/2020 FINDINGS: Similar enlarged cardiac  silhouette. Mild diffuse interstitial thickening and hazy airspace opacities. More dense left basilar opacity. No visible pleural effusions or pneumothorax. No acute osseous abnormality. IMPRESSION: 1. Mild diffuse interstitial thickening and hazy airspace opacities, suggestive of mild pulmonary edema. 2. More dense left basilar consolidation may represent atelectasis and/or pneumonia. 3. Similar enlarged cardiac silhouette, which may represent cardiomegaly and/or pericardial effusion in this patient with a history of pericarditis. Electronically Signed   By: Margaretha Sheffield MD   On: 06/10/2020 11:07   ECHOCARDIOGRAM COMPLETE  Result Date: 06/10/2020    ECHOCARDIOGRAM REPORT   Patient Name:   [name] Date of Exam: 06/10/2020 Medical Rec #:  [id number]         Height:       60.0 in Accession #:    [id number]        Weight:       232.0 lb Date of Birth:  [birth year]         BSA:          1.988 m Patient Age:    57 years          BP:           107/62 mmHg Patient Gender: F                 HR:           110 bpm. Exam Location:  Inpatient Procedure: 2D  Echo, Cardiac Doppler, Color Doppler and Intracardiac            Opacification Agent Indications:     Dyspnea  History:         Patient has prior history of Echocardiogram examinations, most                  recent 12/10/2018. CHF, Aortic Valve Disease, Arrythmias:Atrial                  Fibrillation and Atrial Flutter; Risk Factors:Diabetes and                  Hypertension. Lupus.  Sonographer:     Clayton Lefort RDCS (AE) Referring Phys:  [id number] Elnora Morrison Diagnosing Phys: Vernell Leep MD IMPRESSIONS  1. Left ventricular ejection fraction, by estimation, is 55 to 60%. The left ventricle has normal function. The left ventricle has no regional wall motion abnormalities. There is mild left ventricular hypertrophy. Left ventricular diastolic parameters are indeterminate.  2. Right ventricular systolic function is mildly reduced. The right ventricular size is not well visualized.  3. Left atrial size was moderately dilated.  4. Right atrial size was mildly dilated.  5. Moderate pericardial effusion. The pericardial effusion is posterior to the left ventricle and the left atrium. There is no evidence of cardiac tamponade.  6. No significant change compared to previous outpatient study in 10/2019. FINDINGS  Left Ventricle: Left ventricular ejection fraction, by estimation, is 55 to 60%. The left ventricle has normal function. The left ventricle has no regional wall motion abnormalities. The left ventricular internal cavity size was normal in size. There is  mild left ventricular hypertrophy. Left ventricular diastolic parameters are indeterminate. Right Ventricle: The right ventricular size is not well visualized. No increase in right ventricular wall thickness. Right ventricular systolic function is mildly reduced. Left Atrium: Left atrial size was moderately dilated. Right Atrium: Right atrial size was mildly dilated. Pericardium: A moderately sized pericardial effusion is present. The pericardial effusion is  posterior to the left ventricle and  the left atrium. There is no evidence of cardiac tamponade. Mitral Valve: The mitral valve is grossly normal. No evidence of mitral valve regurgitation. Tricuspid Valve: The tricuspid valve is not well visualized. Tricuspid valve regurgitation is not demonstrated. Aortic Valve: The aortic valve is tricuspid. Aortic valve regurgitation is not visualized. Mild aortic stenosis is present. Aortic valve mean gradient measures 10.2 mmHg. Aortic valve peak gradient measures 17.2 mmHg. Aortic valve area, by VTI measures 1.14 cm. Pulmonic Valve: The pulmonic valve was not assessed. Pulmonic valve regurgitation is not visualized. Aorta: The aortic root is normal in size and structure. IAS/Shunts: No atrial level shunt detected by color flow Doppler.  LEFT VENTRICLE PLAX 2D LVIDd:         4.30 cm LVIDs:         3.00 cm LV PW:         1.50 cm LV IVS:        1.40 cm LVOT diam:     2.00 cm LV SV:         41 LV SV Index:   20 LVOT Area:     3.14 cm  RIGHT VENTRICLE            IVC RV Basal diam:  3.40 cm    IVC diam: 1.80 cm RV S prime:     7.62 cm/s TAPSE (M-mode): 1.0 cm LEFT ATRIUM              Index       RIGHT ATRIUM           Index LA diam:        4.60 cm  2.31 cm/m  RA Area:     19.20 cm LA Vol (A2C):   61.7 ml  31.03 ml/m RA Volume:   51.00 ml  25.65 ml/m LA Vol (A4C):   140.0 ml 70.42 ml/m LA Biplane Vol: 100.0 ml 50.30 ml/m  AORTIC VALVE AV Area (Vmax):    1.17 cm AV Area (Vmean):   1.13 cm AV Area (VTI):     1.14 cm AV Vmax:           207.20 cm/s AV Vmean:          149.000 cm/s AV VTI:            0.357 m AV Peak Grad:      17.2 mmHg AV Mean Grad:      10.2 mmHg LVOT Vmax:         77.06 cm/s LVOT Vmean:        53.800 cm/s LVOT VTI:          0.130 m LVOT/AV VTI ratio: 0.36  AORTA Ao Root diam: 3.10 cm Ao Asc diam:  3.40 cm  SHUNTS Systemic VTI:  0.13 m Systemic Diam: 2.00 cm Vernell Leep MD Electronically signed by Vernell Leep MD Signature Date/Time:  06/10/2020/4:25:58 PM    Final      Assessment & Plan:   ILD (interstitial lung disease) ([place]) - Patient was seen on 06/13/20 for increased shortness of breath. She has been off anti-fibrotics since June 2021. She was admitted to the hospital for 3 days for afib and heat failure. She saw cardiology and medication have been adjusted. She is feeling considerably better. Reports less dyspnea, cough and swelling. She is still requiring supplemental oxygen, re-qualified today. She has not re-started Esbriet yet as she has not received medication. Needs new authorization. Remains on Plaquenil and Imuran for underlying Lupus. She  is not currently active with rheumatology d/t outstanding bill dispute. She is taking 69m prednisone, we will taper this down to 179mand hopefully get her off steriods at next visit. We ordered repeat HRCT during last visit, needs rescheduling. Needs CMET in 4 weeks and FU with Dr. RaChase Caller  Snoring - Strongly suspect underlying sleep apnea. She has a sleep consult with Dr. OlAnder Sladend of November.   Chronic respiratory failure with hypoxia (HCC) - Ambulatory walk today showed patient still requires supplemental oxygen.  - O2 dropped to 88% on RA after 15058frequiring 3L    EliMartyn EhrichP 06/24/2020

## 2020-06-23 ENCOUNTER — Encounter: Payer: Self-pay | Admitting: Primary Care

## 2020-06-23 ENCOUNTER — Ambulatory Visit (INDEPENDENT_AMBULATORY_CARE_PROVIDER_SITE_OTHER): Payer: 59 | Admitting: Primary Care

## 2020-06-23 ENCOUNTER — Telehealth: Payer: Self-pay | Admitting: Primary Care

## 2020-06-23 ENCOUNTER — Other Ambulatory Visit: Payer: Self-pay

## 2020-06-23 ENCOUNTER — Other Ambulatory Visit: Payer: Self-pay | Admitting: Primary Care

## 2020-06-23 ENCOUNTER — Encounter: Payer: Self-pay | Admitting: Nurse Practitioner

## 2020-06-23 VITALS — BP 124/80 | HR 94 | Temp 98.1°F | Ht 60.0 in | Wt 230.2 lb

## 2020-06-23 DIAGNOSIS — J849 Interstitial pulmonary disease, unspecified: Secondary | ICD-10-CM

## 2020-06-23 DIAGNOSIS — J9611 Chronic respiratory failure with hypoxia: Secondary | ICD-10-CM

## 2020-06-23 DIAGNOSIS — J8489 Other specified interstitial pulmonary diseases: Secondary | ICD-10-CM

## 2020-06-23 DIAGNOSIS — R0683 Snoring: Secondary | ICD-10-CM | POA: Diagnosis not present

## 2020-06-23 DIAGNOSIS — M359 Systemic involvement of connective tissue, unspecified: Secondary | ICD-10-CM

## 2020-06-23 MED ORDER — ESBRIET 267 MG PO CAPS: 3.0000 | ORAL_CAPSULE | Freq: Three times a day (TID) | ORAL | 5 refills | Status: AC

## 2020-06-23 MED ORDER — ESBRIET 267 MG PO CAPS: ORAL_CAPSULE | ORAL | 0 refills | Status: DC

## 2020-06-23 NOTE — Telephone Encounter (Signed)
Thanks

## 2020-06-23 NOTE — Telephone Encounter (Signed)
New PA was approved for patient's Esbriet 267mg  restart 04/28/20. Approved through 04/28/21 Patient just needs rx sent in.  Thanks!

## 2020-06-23 NOTE — Telephone Encounter (Signed)
Needs new authorization for [facility]. She has been off Esbriet since June 2021. Needs to restart at 267mg  1 capsule TID x 7 days; 2 capsules TID x 7 days; then 3 capsules TID   CC: [facility] pulmonary pharmacy

## 2020-06-23 NOTE — Telephone Encounter (Signed)
I do see that the titration for the Esbriet was sent to [facility] by [facility] on 06/08/20.  [facility]ed [facility]  At [phone] to see if Rx that was sent on 06/08/20 electronically is showing up at pharmacy and was told that the Rx was on file. They do not have pt's insurance info though. I stated to her pt's insurance info and said that I would fax that to them so they would have.   Pt will have to call to get med filled as they also have clinical questions to ask her prior to them getting the Rx ready to ship.  Called pt but was not able to reach pt by phone and happened to see that she was in office as she had an appt with Beth. Went to exam room and spoke with pt about the Rx and stated to her that she needs to call [facility]. Phone number has been provided for pt. Nothing further needed.

## 2020-06-23 NOTE — Patient Instructions (Addendum)
ILD: - Taper prednisone to 10mg  daily until next visit  - Please start Esbriet when medication arrives (Take Esbriet 267mg  1 capsule three times daily x 7 days; then 2 capsules three times daily x 7 days; then 3 capsules daily) - We will send a new authorization [facility], please notify our office if you do not hear anything or get your medication in the next 10 days   Oxygen: - Ambulatory walk to see if you still require oxygen and how much _____3L_____ - Please contact [employer] to request new O2 regulator, also would need to call [employer] help line to discuss lost property/reimbursement   Labs: - CMET in 4-6 weeks   Follow-up appointments: - Dr Ander Slade on 07/11/20 for sleep consult  - 6 weeks with Dr. Chase Caller ILD clinic

## 2020-06-24 ENCOUNTER — Encounter: Payer: Self-pay | Admitting: Primary Care

## 2020-06-24 DIAGNOSIS — J9611 Chronic respiratory failure with hypoxia: Secondary | ICD-10-CM | POA: Insufficient documentation

## 2020-06-24 DIAGNOSIS — R0683 Snoring: Secondary | ICD-10-CM | POA: Insufficient documentation

## 2020-06-24 NOTE — Assessment & Plan Note (Addendum)
-   Patient was seen on 06/13/20 for increased shortness of breath. She has been off anti-fibrotics since June 2021. She was admitted to the hospital for 3 days for afib and heat failure. She saw cardiology and medication have been adjusted. She is feeling considerably better. Reports less dyspnea, cough and swelling. She is still requiring supplemental oxygen, re-qualified today. She has not re-started Esbriet yet as she has not received medication. Needs new authorization. Remains on Plaquenil and Imuran for underlying Lupus. She is not currently active with rheumatology d/t outstanding bill dispute. She is taking 20mg  prednisone, we will taper this down to 10mg  and hopefully get her off steriods at next visit. We ordered repeat HRCT during last visit, needs rescheduling. Needs CMET in 4 weeks and FU with Dr. Chase Caller.

## 2020-06-24 NOTE — Assessment & Plan Note (Addendum)
-   Ambulatory walk today showed patient still requires supplemental oxygen.  - O2 dropped to 88% on RA after 170ft, requiring 3L

## 2020-06-24 NOTE — Assessment & Plan Note (Signed)
-   Strongly suspect underlying sleep apnea. She has a sleep consult with Dr. Ander Slade end of November.

## 2020-06-26 ENCOUNTER — Encounter: Payer: Self-pay | Admitting: Nurse Practitioner

## 2020-06-26 MED ORDER — TRULICITY 3 MG/0.5ML ~~LOC~~ SOAJ: 3.0000 mg | SUBCUTANEOUS | 0 refills | Status: AC

## 2020-06-27 ENCOUNTER — Other Ambulatory Visit: Payer: Self-pay | Admitting: Nurse Practitioner

## 2020-06-27 DIAGNOSIS — E1165 Type 2 diabetes mellitus with hyperglycemia: Secondary | ICD-10-CM

## 2020-06-27 MED ORDER — INSULIN LISPRO (1 UNIT DIAL) 100 UNIT/ML (KWIKPEN): 8.0000 [IU] | PEN_INJECTOR | Freq: Three times a day (TID) | SUBCUTANEOUS | 1 refills | Status: AC

## 2020-06-27 MED FILL — ?HUMALOG 100 UNITS/ML KWIKP: 100 | 25 days supply | Qty: 3 | Fill #3

## 2020-06-28 ENCOUNTER — Other Ambulatory Visit: Payer: Self-pay

## 2020-06-28 ENCOUNTER — Ambulatory Visit: Payer: 59

## 2020-06-28 MED ORDER — FUROSEMIDE 40 MG PO TABS
40.0000 mg | ORAL_TABLET | Freq: Two times a day (BID) | ORAL | 0 refills | Status: DC
Start: 2020-06-28 — End: 2020-07-14

## 2020-06-30 ENCOUNTER — Telehealth: Payer: Self-pay | Admitting: Internal Medicine

## 2020-06-30 NOTE — Telephone Encounter (Signed)
Called [facility] about pt's Esbriet Rx. Spoke with pharmacist Velma and clarified instructions and quantity for pt's Esbriet Rx.  Called and spoke with pt letting her know that Rx has been clarified and she should be able to call to schedule shipment. Pt verbalized understanding. Nothing further needed.

## 2020-07-04 ENCOUNTER — Telehealth: Payer: Self-pay

## 2020-07-04 ENCOUNTER — Telehealth: Payer: Self-pay | Admitting: *Deleted

## 2020-07-04 NOTE — Telephone Encounter (Signed)
No answer left a vm will try again later

## 2020-07-04 NOTE — Telephone Encounter (Signed)
100 mg 1 tab twice daily

## 2020-07-04 NOTE — Telephone Encounter (Signed)
Pt left VM message stating that se is having itching and discomfort. She requests a call back.

## 2020-07-04 NOTE — Telephone Encounter (Signed)
Patient called that she needs a refill on her metoprolol patient states she is taking 50 mg right now 2 tablets bid but I saw you up her dose to 100 patient states she should still take 2 tablet bid but your script is 1 (100 mg) bid can you please clarify

## 2020-07-05 MED ORDER — FLUCONAZOLE 150 MG PO TABS: 150.0000 mg | ORAL_TABLET | Freq: Once | ORAL | 0 refills | Status: AC

## 2020-07-05 NOTE — Telephone Encounter (Signed)
Patient reports she has burning and itching to the vagina. She is no steroids and her blood sugars are running high. Diflucan sent to Pharmacy.   Reviewed with patient that she is in need of her next Depo appt and yearly exam. Message to front desk to call and schedule. Advised patient she can also call the front desk at [phone] to schedule.

## 2020-07-06 NOTE — Telephone Encounter (Signed)
Relayed information to patient. Patient voiced understanding.  

## 2020-07-08 ENCOUNTER — Other Ambulatory Visit: Payer: Self-pay | Admitting: Cardiology

## 2020-07-11 ENCOUNTER — Institutional Professional Consult (permissible substitution): Payer: 59 | Admitting: Pulmonary Disease

## 2020-07-11 MED FILL — HUMALOG 100 UNITS/ML KWIKPE: 100 | 33 days supply | Qty: 12 | Fill #0

## 2020-07-12 ENCOUNTER — Other Ambulatory Visit: Payer: Self-pay | Admitting: Primary Care

## 2020-07-13 ENCOUNTER — Other Ambulatory Visit: Payer: Self-pay | Admitting: Cardiology

## 2020-07-19 ENCOUNTER — Other Ambulatory Visit: Payer: Self-pay

## 2020-07-19 ENCOUNTER — Encounter: Payer: Self-pay | Admitting: Nurse Practitioner

## 2020-07-19 ENCOUNTER — Ambulatory Visit: Payer: 59 | Attending: Nurse Practitioner | Admitting: Nurse Practitioner

## 2020-07-19 VITALS — BP 142/83 | HR 92 | Temp 96.4°F | Ht 60.0 in | Wt 231.0 lb

## 2020-07-19 DIAGNOSIS — E1165 Type 2 diabetes mellitus with hyperglycemia: Secondary | ICD-10-CM

## 2020-07-19 LAB — GLUCOSE, POCT (MANUAL RESULT ENTRY): POC Glucose: 209 mg/dl — AB (ref 70–99)

## 2020-07-19 MED ORDER — FREESTYLE LIBRE 2 SENSOR MISC: 6 refills | Status: AC

## 2020-07-19 MED FILL — TRUEplus 5-BEVEL PEN NEEDLE: 32G X 4 MM | 25 days supply | Qty: 100 | Fill #1

## 2020-07-19 NOTE — Progress Notes (Signed)
Assessment & Plan:  [name] was seen today for follow-up.  Diagnoses and all orders for this visit:  Type 2 diabetes mellitus with hyperglycemia, without long-term current use of insulin (HCC) -     Glucose (CBG) -     Continuous Blood Gluc Sensor (FREESTYLE LIBRE 2 SENSOR) MISC; Use as instructed. Check blood glucose levels 8 times per day. E11.65 Z91.89 Continue blood sugar control as discussed in office today, low carbohydrate diet, and regular physical exercise as tolerated, 150 minutes per week (30 min each day, 5 days per week, or 50 min 3 days per week). Keep blood sugar logs with fasting goal of 90-130 mg/dl, post prandial (after you eat) less than 180.  For Hypoglycemia: BS <60 and Hyperglycemia BS >400; contact the clinic ASAP. Annual eye exams and foot exams are recommended.   Patient has been counseled on age-appropriate routine health concerns for screening and prevention. These are reviewed and up-to-date. Referrals have been placed accordingly. Immunizations are up-to-date or declined.    Subjective:   Chief Complaint  Patient presents with   Follow-up    Pt. is here for her meter check for diabetes.    HPI [name] 44 y.o. female presents to office today for meter check. She has chronic medical conditions (LUPUS and ILD) which require long term steroid use and she has poorly controlled diabetes due to the steroids. She is also being managed by Cardiology, Pulmonology and Rheumatology   DM TYPE 2 She has been out of basaglar for a few weeks. Average glucose readings: 7 day average 276;14 day average 278, 30 day average 277; and 90 day average 260. She has 2 more days until she finishes her current round of chronic  steroids. She is currently taking trulicity 3 mg weekly, basaglar 30 units daily and humalog 8 units daily. She had been instructed to also add sliding scale insulin but after discussing today she had not been administering her SSI correctly. I did  instruct her to take her SSI based on her post prandial readings. LDL not at goal with rosuvastatin 20 mg daily. Blood pressure is not well controlled today. She has an appt with Cardiology in 2 days. Denies chest pain or worsening shortness of breath.  Lab Results  Component Value Date   HGBA1C 11.8 (H) 06/11/2020   Lab Results  Component Value Date   LDLCALC 79 04/19/2020   BP Readings from Last 3 Encounters:  07/21/20 (!) 139/100  07/19/20 (!) 142/83  06/23/20 124/80   Review of Systems  Constitutional: Negative for fever, malaise/fatigue and weight loss.  HENT: Negative.  Negative for nosebleeds.   Eyes: Negative.  Negative for blurred vision, double vision and photophobia.  Respiratory: Positive for shortness of breath (chronic). Negative for cough and wheezing.   Cardiovascular: Negative.  Negative for chest pain, palpitations and leg swelling.  Gastrointestinal: Negative.  Negative for heartburn, nausea and vomiting.  Musculoskeletal: Negative.  Negative for myalgias.  Neurological: Negative.  Negative for dizziness, focal weakness, seizures and headaches.  Psychiatric/Behavioral: Negative.  Negative for suicidal ideas.    Past Medical History:  Diagnosis Date   Abnormal Pap smear of cervix    colpo, HPV   Allergy    Anemia    Atrial fibrillation (HCC)    CHF (congestive heart failure) (HCC)    Depression    after losses   Dyspnea    Enlarged heart    managed by cardiology   Fibroid  Gestational diabetes    Heart murmur    Hypertension    ILD (interstitial lung disease) ([place])    Infection    UTI   Lupus ([place]) 1999   Lupus ([place])    Pericarditis     Past Surgical History:  Procedure Laterality Date   A-FLUTTER ABLATION N/A 06/10/2018   Procedure: A-FLUTTER ABLATION;  Surgeon: Evans Lance, MD;  Location: [facility];  Service: Cardiovascular;  Laterality: N/A;   CARDIAC CATHETERIZATION     CORONARY STENT INTERVENTION N/A  12/12/2018   Procedure: CORONARY STENT INTERVENTION;  Surgeon: [name], MD;  Location: [facility];  Service: Cardiovascular;  Laterality: N/A;  lad    RIGHT/LEFT HEART CATH AND CORONARY ANGIOGRAPHY N/A 03/14/2018   Procedure: RIGHT/LEFT HEART CATH AND CORONARY ANGIOGRAPHY;  Surgeon: [name], MD;  Location: [facility];  Service: Cardiovascular;  Laterality: N/A;   RIGHT/LEFT HEART CATH AND CORONARY ANGIOGRAPHY N/A 12/12/2018   Procedure: RIGHT/LEFT HEART CATH AND CORONARY ANGIOGRAPHY;  Surgeon: [name], MD;  Location: [facility];  Service: Cardiovascular;  Laterality: N/A;   THERAPEUTIC ABORTION      Family History  Problem Relation Age of Onset   Cancer Maternal Grandfather        lung   Diabetes Mother    Hypertension Mother    Hypertension Maternal Grandmother    Diabetes Father    Hearing loss Neg Hx     Social History Reviewed with no changes to be made today.   Outpatient Medications Prior to Visit  Medication Sig Dispense Refill   acetaminophen (TYLENOL) 500 MG tablet Take 1,000 mg by mouth every 6 (six) hours as needed for headache (pain).     albuterol (VENTOLIN HFA) 108 (90 Base) MCG/ACT inhaler Inhale 2 puffs into the lungs every 6 (six) hours as needed for wheezing or shortness of breath. 8 g 5   aspirin EC 81 MG tablet Take 1 tablet (81 mg total) by mouth daily. Swallow whole. 90 tablet 3   azaTHIOprine (IMURAN) 50 MG tablet Take 50 mg by mouth 2 (two) times daily.     Blood Glucose Monitoring Suppl (ONETOUCH VERIO) w/Device KIT 1 each by Does not apply route 3 (three) times daily. 1 kit 0   colchicine 0.6 MG tablet Take 1 tablet (0.6 mg total) by mouth daily. 60 tablet 1   Continuous Blood Gluc Receiver (FREESTYLE LIBRE 2 READER) DEVI Use as instructed. Check blood glucose levels five times per day per day. E11.65 Z91.89 1 each 1   diltiazem (CARDIZEM CD) 240 MG 24 hr capsule TAKE 1 CAPSULE BY MOUTH  EVERY DAY (Patient taking differently: Take 240 mg by mouth daily.) 90 capsule 2   Dulaglutide (TRULICITY) 3 UC/7.6RW SOPN Inject 3 mg as directed once a week. 6 mL 0   ELIQUIS 5 MG TABS tablet Take 1 tablet by mouth twice daily 180 tablet 0   furosemide (LASIX) 40 MG tablet Take 1 tablet by mouth twice daily (Patient not taking: Reported on 07/21/2020) 30 tablet 0   glucose blood (ACCU-CHEK GUIDE) test strip Use as instructed to check blood sugar twice daily. E11.65 100 each 6   guaiFENesin (MUCINEX) 600 MG 12 hr tablet Take 1,200 mg by mouth every 12 (twelve) hours as needed for cough or to loosen phlegm.     hydroxychloroquine (PLAQUENIL) 200 MG tablet Take 1 tablet (200 mg total) by mouth 2 (two) times daily. 60 tablet  3   Insulin Glargine (BASAGLAR KWIKPEN) 100 UNIT/ML Inject 30 Units into the skin daily. E11.65. may increase by 2 units every 3 days for fasting blood glucose readings greater than 130. 6 mL 6   insulin lispro (HUMALOG KWIKPEN) 100 UNIT/ML KwikPen Inject 8 Units into the skin 3 (three) times daily. For blood sugars 0-150 give 0 units of insulin, 151-200 give 2 units of insulin, 201-250 give 4 units, 251-300 give 6 units, 301-350 give 8 units, 351-400 give 10 units,> 400 give 12 units and call office. Discussed hypoglycemia protocol. 21.6 mL 1   Insulin Pen Needle (TRUEPLUS PEN NEEDLES) 32G X 4 MM MISC Use as directed to inject insulin 5x daily 200 each 3   Lancet Devices (ONE TOUCH DELICA LANCING DEV) MISC 1 each by Does not apply route 3 (three) times daily. 1 each 1   losartan (COZAAR) 50 MG tablet TAKE 1 TABLET BY MOUTH EVERY DAY (Patient taking differently: Take 50 mg by mouth daily.) 90 tablet 3   medroxyPROGESTERone (DEPO-PROVERA) 150 MG/ML injection Inject 150 mg into the muscle every 3 (three) months.     metoprolol tartrate (LOPRESSOR) 100 MG tablet Take 1 tablet (100 mg total) by mouth 2 (two) times daily. 180 tablet 3   OneTouch Delica Lancets 37T MISC 1 each  by Does not apply route 3 (three) times daily. 100 each 12   Pirfenidone (ESBRIET) 267 MG CAPS Take 3 capsules by mouth in the morning, at noon, and at bedtime. 270 capsule 5   predniSONE (DELTASONE) 20 MG tablet Take 1 tablet (20 mg total) by mouth daily with breakfast. 30 tablet 0   rosuvastatin (CRESTOR) 20 MG tablet TAKE 1 TABLET BY MOUTH EVERY DAY AT 6PM (Patient taking differently: Take 20 mg by mouth daily at 6 PM. TAKE 1 TABLET BY MOUTH EVERY DAY AT 6PM) 90 tablet 1   Continuous Blood Gluc Sensor (FREESTYLE LIBRE 2 SENSOR) MISC Use as instructed. Check blood glucose levels 5 times per day. E11.65 Z91.89 1 each 6   ESBRIET 267 MG CAPS Take 801 mg by mouth in the morning, at noon, and at bedtime. 270 capsule 11   ESBRIET 267 MG CAPS Take 1 capsule by mouth in the morning, at noon, and at bedtime for 7 days, THEN 2 capsules in the morning, at noon, and at bedtime for 7 days, THEN 3 capsules in the morning, at noon, and at bedtime for 7 days. 207 capsule 0   No facility-administered medications prior to visit.    Allergies  Allergen Reactions   Septra [Bactrim] Hives   Sulfamethoxazole-Trimethoprim Hives            Objective:    BP (!) 142/83 (BP Location: Right Arm, Patient Position: Sitting, Cuff Size: Large)    Pulse 92    Temp (!) 96.4 F (35.8 C) (Temporal)    Ht 5' (1.524 m)    Wt 231 lb (104.8 kg)    SpO2 96%    BMI 45.11 kg/m  Wt Readings from Last 3 Encounters:  07/21/20 231 lb (104.8 kg)  07/19/20 231 lb (104.8 kg)  06/23/20 230 lb 3.2 oz (104.4 kg)    Physical Exam Vitals and nursing note reviewed.  Constitutional:      Appearance: She is well-developed and well-nourished.  HENT:     Head: Normocephalic and atraumatic.  Eyes:     Extraocular Movements: EOM normal.  Cardiovascular:     Rate and Rhythm: Normal rate and regular rhythm.  Pulses: Intact distal pulses.     Heart sounds: Murmur heard.  No friction rub. No gallop.   Pulmonary:      Effort: Pulmonary effort is normal. No tachypnea or respiratory distress.     Breath sounds: Normal breath sounds. No decreased breath sounds, wheezing, rhonchi or rales.  Chest:     Chest wall: No tenderness.  Abdominal:     General: Bowel sounds are normal.     Palpations: Abdomen is soft.  Musculoskeletal:        General: No edema. Normal range of motion.     Cervical back: Normal range of motion.  Skin:    General: Skin is warm and dry.  Neurological:     Mental Status: She is alert and oriented to person, place, and time.     Coordination: Coordination normal.  Psychiatric:        Mood and Affect: Mood and affect normal.        Behavior: Behavior normal. Behavior is cooperative.        Thought Content: Thought content normal.        Judgment: Judgment normal.          Patient has been counseled extensively about nutrition and exercise as well as the importance of adherence with medications and regular follow-up. The patient was given clear instructions to go to ER or return to medical center if symptoms don't improve, worsen or new problems develop. The patient verbalized understanding.   Follow-up: Return in about 6 weeks (around 08/30/2020) for meter check.   Gildardo Pounds, FNP-BC [facility] [place], [place]   07/19/2020, 9:09 AM

## 2020-07-21 ENCOUNTER — Other Ambulatory Visit: Payer: Self-pay

## 2020-07-21 ENCOUNTER — Ambulatory Visit: Payer: 59 | Admitting: Cardiology

## 2020-07-21 ENCOUNTER — Encounter: Payer: Self-pay | Admitting: Cardiology

## 2020-07-21 VITALS — BP 139/100 | HR 82 | Resp 16 | Ht 60.0 in | Wt 231.0 lb

## 2020-07-21 DIAGNOSIS — I2510 Atherosclerotic heart disease of native coronary artery without angina pectoris: Secondary | ICD-10-CM

## 2020-07-21 DIAGNOSIS — I350 Nonrheumatic aortic (valve) stenosis: Secondary | ICD-10-CM

## 2020-07-21 DIAGNOSIS — I3139 Other pericardial effusion (noninflammatory): Secondary | ICD-10-CM

## 2020-07-21 DIAGNOSIS — I313 Pericardial effusion (noninflammatory): Secondary | ICD-10-CM

## 2020-07-21 DIAGNOSIS — I480 Paroxysmal atrial fibrillation: Secondary | ICD-10-CM

## 2020-07-21 NOTE — Progress Notes (Signed)
Subjective:   [name], female    DOB: June 22, 1976, 44 y.o.   MRN: [id number]   Chief complaint:  Shortness of breath  HPI  44 year old female with long-standing SLE, hypertension, type 2 diabetes mellitus, CAD s/p pLAD PCI for critical stenosis (12/12/2018), paroxysmal Afib w/RVR, moderate aortic stenosis, stable pericardial effusion without tamponade or constriction, interstitial lung disease.  Patient was hospitalized 05/2020 with complaints of shortness of breath and was found to be in paroxysmal A. fib with RVR and HFpEF exacerbation.  She was treated with IV Lasix, metoprolol.  Prior to this, was noted to hypoxia during her pulmonology visit.  She was taken off her antifibrotics during hospital admission in June in [place].  Since then, she has been on tapering doses of prednisone. At last visit in 06/2020, I had  Increased metoprolol tartrate to 100 mg twice daily, continued diltiazem 240 mg daily, for better rate control.  ILD is managed by pulmonology. HRCT is pending. She is now started back on antifibrotics. Her breathing has improved. Sit appears that she has been taking metoprolol 50 mg twice a day, as opposed to 100 mg twice a day.    Current Outpatient Medications on File Prior to Visit  Medication Sig Dispense Refill  . acetaminophen (TYLENOL) 500 MG tablet Take 1,000 mg by mouth every 6 (six) hours as needed for headache (pain).    Marland Kitchen albuterol (VENTOLIN HFA) 108 (90 Base) MCG/ACT inhaler Inhale 2 puffs into the lungs every 6 (six) hours as needed for wheezing or shortness of breath. 8 g 5  . aspirin EC 81 MG tablet Take 1 tablet (81 mg total) by mouth daily. Swallow whole. 90 tablet 3  . azaTHIOprine (IMURAN) 50 MG tablet Take 50 mg by mouth 2 (two) times daily.    . Blood Glucose Monitoring Suppl (ONETOUCH VERIO) w/Device KIT 1 each by Does not apply route 3 (three) times daily. 1 kit 0  . colchicine 0.6 MG tablet Take 1 tablet (0.6 mg total) by mouth daily. 60  tablet 1  . Continuous Blood Gluc Receiver (FREESTYLE LIBRE 2 READER) DEVI Use as instructed. Check blood glucose levels five times per day per day. E11.65 Z91.89 1 each 1  . Continuous Blood Gluc Sensor (FREESTYLE LIBRE 2 SENSOR) MISC Use as instructed. Check blood glucose levels 8 times per day. E11.65 Z91.89 2 each 6  . diltiazem (CARDIZEM CD) 240 MG 24 hr capsule TAKE 1 CAPSULE BY MOUTH EVERY DAY (Patient taking differently: Take 240 mg by mouth daily. ) 90 capsule 2  . Dulaglutide (TRULICITY) 3 WU/9.8JX SOPN Inject 3 mg as directed once a week. 6 mL 0  . ELIQUIS 5 MG TABS tablet Take 1 tablet by mouth twice daily 180 tablet 0  . ESBRIET 267 MG CAPS Take 1 capsule by mouth in the morning, at noon, and at bedtime for 7 days, THEN 2 capsules in the morning, at noon, and at bedtime for 7 days, THEN 3 capsules in the morning, at noon, and at bedtime for 7 days. 207 capsule 0  . ESBRIET 267 MG CAPS Take 801 mg by mouth in the morning, at noon, and at bedtime. 270 capsule 11  . furosemide (LASIX) 40 MG tablet Take 1 tablet by mouth twice daily 30 tablet 0  . glucose blood (ACCU-CHEK GUIDE) test strip Use as instructed to check blood sugar twice daily. E11.65 100 each 6  . guaiFENesin (MUCINEX) 600 MG 12 hr tablet Take 1,200 mg  by mouth every 12 (twelve) hours as needed for cough or to loosen phlegm.    . hydroxychloroquine (PLAQUENIL) 200 MG tablet Take 1 tablet (200 mg total) by mouth 2 (two) times daily. 60 tablet 3  . Insulin Glargine (BASAGLAR KWIKPEN) 100 UNIT/ML Inject 30 Units into the skin daily. E11.65. may increase by 2 units every 3 days for fasting blood glucose readings greater than 130. 6 mL 6  . insulin lispro (HUMALOG KWIKPEN) 100 UNIT/ML KwikPen Inject 8 Units into the skin 3 (three) times daily. For blood sugars 0-150 give 0 units of insulin, 151-200 give 2 units of insulin, 201-250 give 4 units, 251-300 give 6 units, 301-350 give 8 units, 351-400 give 10 units,> 400 give 12 units and  call office. Discussed hypoglycemia protocol. 21.6 mL 1  . Insulin Pen Needle (TRUEPLUS PEN NEEDLES) 32G X 4 MM MISC Use as directed to inject insulin 5x daily 200 each 3  . Lancet Devices (ONE TOUCH DELICA LANCING DEV) MISC 1 each by Does not apply route 3 (three) times daily. 1 each 1  . losartan (COZAAR) 50 MG tablet TAKE 1 TABLET BY MOUTH EVERY DAY (Patient taking differently: Take 50 mg by mouth daily. ) 90 tablet 3  . medroxyPROGESTERone (DEPO-PROVERA) 150 MG/ML injection Inject 150 mg into the muscle every 3 (three) months.    . metoprolol tartrate (LOPRESSOR) 100 MG tablet Take 1 tablet (100 mg total) by mouth 2 (two) times daily. 180 tablet 3  . OneTouch Delica Lancets 92K MISC 1 each by Does not apply route 3 (three) times daily. 100 each 12  . Pirfenidone (ESBRIET) 267 MG CAPS Take 3 capsules by mouth in the morning, at noon, and at bedtime. 270 capsule 5  . predniSONE (DELTASONE) 20 MG tablet Take 1 tablet (20 mg total) by mouth daily with breakfast. 30 tablet 0  . rosuvastatin (CRESTOR) 20 MG tablet TAKE 1 TABLET BY MOUTH EVERY DAY AT 6PM (Patient taking differently: Take 20 mg by mouth daily at 6 PM. TAKE 1 TABLET BY MOUTH EVERY DAY AT 6PM) 90 tablet 1  . [DISCONTINUED] insulin aspart (NOVOLOG FLEXPEN) 100 UNIT/ML FlexPen For blood sugars 0-150 give 0 units of insulin, 151-200 give 2 units of insulin, 201-250 give 4 units, 251-300 give 6 units, 301-350 give 8 units, 351-400 give 10 units,> 400 give 12 units and call M.D. Discussed hypoglycemia protocol. 15 mL 3   No current facility-administered medications on file prior to visit.    Cardiovascular studies:  EKG 07/21/2020: Sinus tachycardia 107 bpm with multiform PACs Right axis deviation Nonspecific T-abnormality  Echocardiogram 06/10/2020: 1. Left ventricular ejection fraction, by estimation, is 55 to 60%. The  left ventricle has normal function. The left ventricle has no regional  wall motion abnormalities. There is mild  left ventricular hypertrophy.  Left ventricular diastolic parameters  are indeterminate.  2. Right ventricular systolic function is mildly reduced. The right  ventricular size is not well visualized.  3. Left atrial size was moderately dilated.  4. Right atrial size was mildly dilated.  5. Moderate pericardial effusion. The pericardial effusion is posterior  to the left ventricle and the left atrium. There is no evidence of cardiac  tamponade.  6. No significant change compared to previous outpatient study in 10/2019.   EKG 04/22/2020: Sinus tachycardia 100 bpm Right axis deviation First degree AV block Occasional PAC/PVC Low voltage   Echocardiogram 01/12/2020 (External): Concentric LVH with abnormal diastolic function and left atrial  enlargement.  Normal  LVEF without significant wall motion abnormalities. Aortic valve  sclerosis with estimated moderate stenosis  Right heart enlargement/dysfunction with estimated moderately elevated  pulmonary artery systolic pressure  Small circumferential pericardial effusion without frank tamponade  Mildly dilated ascending aorta.   CT chest 01/12/2020: Sub-optimal examination for segmental and subsegmental pulmonary embolism due to respiratory motion artifact.  No evidence of pulmonary embolism.  Extensive bilateral patchy and groundglass lung opacification, likely multifocal infection/inflammationwith underlying mild edema. Follow-up chest imaging should be performed as clinically warranted.  Moderate to large pericardial effusion.  Cardiomegaly. Mild pulmonary artery enlargement. Reflux of IV contrast into the IVC and hepatic veins, suggestive of elevated right heart pressures.  Aortic valve calcification. Moderate left-sided coronary artery calcifications. These findings are advanced for patient's age. Clinical correlation is recommended.  Multiple borderline sized to mildly enlarged mediastinal and hilar lymph nodes, suspected to be  reactive.    EKG 12/12/2018: Sinus rhythm, aberrant conducted PAC's  R/LHC, coronary angiography and intervention 12/12/2018: RA: 10 mmHg RV: 46/7 mmHg. RVEDP 8 mmHg PA: 48/22 mmHg. Mean PA 33 mmHg PW: 17 mmHg LV 155/7 mmHg. LVEDP 10 mmHg <10 mmHg LV-Ao pullback gradient CO 7.8 L/min. CI 4 L/min/m2  Simultaneous RV-LV pressure tracings do not show interdependence to suggest constriction physiology.  LM: Wall calcium with no significant stenosis LAD: Ostial-proximal 90% stenosis. Normal diagonal branches. Successful PTCA and stent placement pLAD Synergy DES 3.5X16 mm DES Post dilatation with 4.0 X 8 mm [place] balloon. 0% residual stenosis Ramus: Normal LCx: Normal RCA: Normal   Recent labs: 06/21/2020: Glucose 323, BUN/Cr 11/0.87. EGFR 94. Na/K 140/3.5  Results for STPEHANIE, MONTROY (MRN [id number]) as of 06/22/2020 14:58  Ref. Range 07/06/2019 16:55 07/06/2019 19:46 06/10/2020 10:40 06/10/2020 13:07  Troponin I (High Sensitivity) Latest Ref Range: <18 ng/L 36 (H) 40 (H) 24 (H) 20 (H)    06/11/2020: H/H 11.9/39.2. MCV 78.7. Platelets 493   04/19/2020: Glucose 415, BUN/Cr 19/1.07. EGFR 73. Na/K 137/3.9. Rest of the CMP normal H/H 13/43. MCV 83. Platelets 312 HbA1C 11.1% Chol 187, TG 221, HDL 72, LDL 79  09/07/2019: Glucose 99, BUN/Cr 10/0.76. EGFR 110. Na/K 142/3.3. Rest of the CMP normal H/H 11/34. MCV 79. Platelets 337 HbA1C 6.8%  Results for DERRA, SHARTZER (MRN [id number]) as of 10/23/2019 05:45  Ref. Range 04/13/2019 14:25 04/13/2019 16:07 07/06/2019 16:55 07/06/2019 19:46  Troponin I (High Sensitivity) Latest Ref Range: <18 ng/L 42 (H) 44 (H) 36 (H) 40 (H)    Review of Systems  Cardiovascular: Negative for chest pain, dyspnea on exertion, leg swelling, palpitations and syncope.        Vitals:   07/21/20 1503 07/21/20 1511  BP: (!) 176/103 (!) 139/100  Pulse: 77 82  Resp: 16   SpO2: 97% 98%    Physical Exam Vitals and nursing note reviewed.   Constitutional:      General: She is not in acute distress. Neck:     Vascular: No JVD.  Cardiovascular:     Rate and Rhythm: Normal rate and regular rhythm. Frequent extrasystoles are present.    Heart sounds: Murmur heard.   Harsh midsystolic murmur is present with a grade of 2/6 at the upper right sternal border radiating to the neck.   Pulmonary:     Effort: Pulmonary effort is normal.     Breath sounds: Normal breath sounds. No wheezing or rales.           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 Paroxysmal atrial fibrillation (HCC) Currently in sinus rhythm with multiple PACs.   Take metoprolol tartrate 100 mg twice daily as recommedned, continue diltiazem 240 mg daily. CHA2DS2VASc score 3.  Continue eliquis 5 mg bid  2. Coronary artery disease involving native coronary artery of native heart without angina pectoris S/p pLAD PCI. Recommend Aspirin 81 mg No angina symptoms. Continue Crestor 20 mg.   3. Nonrheumatic aortic valve stenosis Stable.  Repeat echocardiogram in 1 year.  4. Pericardial effusion, HFpEF Unchanged moderate sized, predominantly posteriorly loculated pericardial effusion since 2018.  She does not have any tamponade on echocardiogram.  Constrictive pericarditis remains in the differential, although not seen on cardiac catheterization in 2019.  Constrictive pericarditis would not explain her severe hypoxia.  If she continues to have recurrent admissions with heart failure exacerbation, I will consider right and left heart catheterization to further evaluate for obstructive pericarditis.  Even if she did have constrictive pericarditis, I remain skeptical about her candidacy  for a pericardiectomy, owing to her severe underlying lung disease.  Continue f/u w/pulmonology.  F/u w/me in 3 months  Brylee Berk Esther Hardy, MD [facility] Pager: [phone] Office: [phone] If no answer Cell [phone]

## 2020-07-24 ENCOUNTER — Encounter: Payer: Self-pay | Admitting: Nurse Practitioner

## 2020-07-26 ENCOUNTER — Other Ambulatory Visit: Payer: Self-pay

## 2020-07-26 ENCOUNTER — Ambulatory Visit (INDEPENDENT_AMBULATORY_CARE_PROVIDER_SITE_OTHER): Payer: Self-pay | Admitting: Internal Medicine

## 2020-07-26 ENCOUNTER — Encounter: Payer: Self-pay | Admitting: Internal Medicine

## 2020-07-26 VITALS — BP 138/88 | HR 120 | Temp 97.5°F | Ht 60.0 in | Wt 230.0 lb

## 2020-07-26 DIAGNOSIS — Z79899 Other long term (current) drug therapy: Secondary | ICD-10-CM

## 2020-07-26 DIAGNOSIS — M359 Systemic involvement of connective tissue, unspecified: Secondary | ICD-10-CM

## 2020-07-26 DIAGNOSIS — R0683 Snoring: Secondary | ICD-10-CM

## 2020-07-26 DIAGNOSIS — Z23 Encounter for immunization: Secondary | ICD-10-CM

## 2020-07-26 DIAGNOSIS — I3139 Other pericardial effusion (noninflammatory): Secondary | ICD-10-CM

## 2020-07-26 DIAGNOSIS — J8489 Other specified interstitial pulmonary diseases: Secondary | ICD-10-CM

## 2020-07-26 DIAGNOSIS — J849 Interstitial pulmonary disease, unspecified: Secondary | ICD-10-CM

## 2020-07-26 DIAGNOSIS — Z7185 Encounter for immunization safety counseling: Secondary | ICD-10-CM

## 2020-07-26 DIAGNOSIS — I313 Pericardial effusion (noninflammatory): Secondary | ICD-10-CM

## 2020-07-26 DIAGNOSIS — Z5181 Encounter for therapeutic drug level monitoring: Secondary | ICD-10-CM

## 2020-07-26 LAB — CBC WITH DIFFERENTIAL/PLATELET
Basophils Absolute: 0.1 10*3/uL (ref 0.0–0.1)
Basophils Relative: 0.8 % (ref 0.0–3.0)
Eosinophils Absolute: 0 10*3/uL (ref 0.0–0.7)
Eosinophils Relative: 0.5 % (ref 0.0–5.0)
HCT: 39.8 % (ref 36.0–46.0)
Hemoglobin: 12.7 g/dL (ref 12.0–15.0)
Lymphocytes Relative: 19.1 % (ref 12.0–46.0)
Lymphs Abs: 1.3 10*3/uL (ref 0.7–4.0)
MCHC: 32 g/dL (ref 30.0–36.0)
MCV: 75.2 fl — ABNORMAL LOW (ref 78.0–100.0)
Monocytes Absolute: 1 10*3/uL (ref 0.1–1.0)
Monocytes Relative: 14.8 % — ABNORMAL HIGH (ref 3.0–12.0)
Neutro Abs: 4.4 10*3/uL (ref 1.4–7.7)
Neutrophils Relative %: 64.8 % (ref 43.0–77.0)
Platelets: 219 10*3/uL (ref 150.0–400.0)
RBC: 5.29 Mil/uL — ABNORMAL HIGH (ref 3.87–5.11)
RDW: 15.5 % (ref 11.5–15.5)
WBC: 6.8 10*3/uL (ref 4.0–10.5)

## 2020-07-26 LAB — HEPATIC FUNCTION PANEL
ALT: 14 U/L (ref 0–35)
AST: 14 U/L (ref 0–37)
Albumin: 3.7 g/dL (ref 3.5–5.2)
Alkaline Phosphatase: 73 U/L (ref 39–117)
Bilirubin, Direct: 0.1 mg/dL (ref 0.0–0.3)
Total Bilirubin: 0.4 mg/dL (ref 0.2–1.2)
Total Protein: 7.9 g/dL (ref 6.0–8.3)

## 2020-07-26 LAB — BASIC METABOLIC PANEL
BUN: 13 mg/dL (ref 6–23)
CO2: 29 mEq/L (ref 19–32)
Calcium: 9.4 mg/dL (ref 8.4–10.5)
Chloride: 99 mEq/L (ref 96–112)
Creatinine, Ser: 0.91 mg/dL (ref 0.40–1.20)
GFR: 76.52 mL/min (ref >60.00)
Glucose, Bld: 244 mg/dL — ABNORMAL HIGH (ref 70–99)
Potassium: 3.1 mEq/L — ABNORMAL LOW (ref 3.5–5.1)
Sodium: 137 mEq/L (ref 135–145)

## 2020-07-26 NOTE — Patient Instructions (Addendum)
No diagnosis found.   Interstitial lung disease due to connective tissue disease ([place]) Therapeutic drug monitoring High risk prescription Immunosuppressed status  -My concern is that the pulmonary fibrosis might be getting worse evidenced by exertional o2 need since Nov 2021 - Glad you are back on esbriet - Noted esbriet causing mild heart burn  Plan  - continue exertional o2  -Continue esbriet per schedule  - -Ensure you apply sunscreen at all times when going out  -  for diarrhea take Imodium as needed   -We will do a fresh prescription as well  = take OTC pepcid daily  - check cbc, bmet, lft 07/26/2020 -Do high-resolution CT chest in 4-8 weeks supine and prone - Do spirometry and dlco next 4-8 weeks   Snoring Morbid obesity with BMI of 45.0-49.9, adult (HCC)  -too bad you missed your sleep doc appt by oversleeping   Plan -RE_Refer Dr. Elsworth Soho or Dr. Leretha Pol in our office for undiagnosed sleep apnea -Ultimately will have to figure out a weight loss plan  Lupus ([place])   -Glad you are feeling better with intermittent prednisone but concern is daily prednisone causes side  effect - Glad plaquenil and immuran is helping  - [facility] are not seeing you because of insurane  Plan  -refer Dr Ignacia Marvel [facility] -0> if they wont see then can refer to [facility]    Pericardial effusion  -Is described as moderate on the CT scan in October 2020. Same as feb 2020 and similar oct 2021 without change.    Plan  - per Dr Virgina Jock who is monitoring it  Pulmonary hypertension -seen on right heart catheterization May 2020 but associated with high wedge pressure  --This was seen in the heart catheterization in May 2020 and was at that time because of excess fluid in the body  Plan  - Right heart cath at some point per Dr Virgina Jock  VAccine counseling   - records show you have not have had flu shot or covid booster  this season  Plan - covid vaccine booster  ASAP  - flu shot 07/26/2020   Follow-up -Return to see Dr. Chase Caller in 4-8 weeks and a 30-minute slot but after high-resolution CT chest -

## 2020-07-26 NOTE — Progress Notes (Signed)
Subjective:    Patient ID: [name], female    DOB: June 12, 1976, 44 y.o.   MRN: [id number]  HPI   OV 06/11/2019  Subjective:  Patient ID: [name], female , DOB: April 29, 1976 , age 44 y.o. , MRN: [id number] , ADDRESS: [address] [place] [address]   06/11/2019 -   Chief Complaint  Patient presents with  . Follow-up    Patient reports that she's doing much better and her cough is alot better than it was.      HPI [name] 44 y.o. -is being evaluated to the interstitial lung disease clinic.  She is lupus related interstitial lung disease.  Details are all listed below.  She is unaware of the presence of interstitial lung disease.  I personally visualized the scan.  Documented to have ILD in 2011 worse in February 2020 the traction bronchiectasis is worse now in October 2020 later scan.  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  Severity is listed below.  Is associated with arthralgia.  Also since May 2019 she has chronic cough that is very severe.  It gets better with prednisone.  Sometimes clear sputum but sometimes dry.  It is worse when she lies down.  It affects her voice she clears her throat and she does feel a tickle in the back of the throat.  Other symptoms include arthralgia muscle pain rash from lupus and depression. Dyspnea is progressive this year.   Past Medical History :  -   #Lupus -according to her lupus was diagnosed in 1999.  This was at the time of childbirth.  Since then through May 2019 symptoms were mainly malar rash and other skin lesions and arthralgia.  She was on chronic long-term Plaquenil.  She would only be on occasional prednisone.   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  This includes pericardial effusion,  coronary artery disease,  atrial fibrillation.  She says since then she is required prednisone which she took constantly for 3 months through August 2019.  After that she has had intermittent course of prednisone ranging from several days to few months.  In total 7 times.  She has had admissions to the hospital because of this and is listing for heart failure, coronary artery disease and also ILD flareup.  She had a right heart cath and left heart cath.  This was done in May 2020 by Dr. Virgina Jock.  She is status post coronary stent and is on antiplatelet therapy with Plavix and Eliquis.  Her wedge pressures were slightly high and so was a pulmonary artery pressures.  Her longstanding pericardial effusion is being monitored clinically.  There is no evidence of tamponade as of May 2020 right heart catheterization.  She states that she is been on and off prednisone.  In spring 2020 she was started on Imuran by Dr. Melissa Noon team.  Most recently in September 2020 [late September] she tried to wean herself off prednisone but then her shortness of breath cough wheezing and skin lesions and arthralgia all get worse and  so she is back on prednisone.  This most recent prednisone was initiated by our office nurse practitioner.  She feels Imuran is not controlling her lupus as well as the prednisone    #History positive for CHF not otherwise specified coronary artery disease and pericardial effusion and atrial fibrillation  #  she says she has sleep apnea but is not on CPAP.  Details not known.  She has diabetes.  She has DJD  ROS: Positive for fatigue arthralgia discoloration of her fingers in cold weather [Raynaud's] malar rash and other rash snoring heartburn but no oral ulcers.  No nausea vomiting or  diarrhea.   FAMILY HISTORY of LUNG DISEASE: She has a cousin with asthma and grandmother with sarcoidosis.  Another cousin with an autoimmune disease.   EXPOSURE HISTORY: She does not smoke cigarettes.  No electronic cigarette use.  No marijuana use no cocaine use no IV drug abuse.   HOME and HOBBY DETAILS : [place] the last 2 years.  Age of the home is unknown.  In the home there is no history of any organic antigen exposure.  For example she does not live in a damp environment.  There is no mildew in the bathrooms.  There is no humidifier.  She does not use a CPAP.  No nebulizer machine use.  No steam Jacuzzi.  No steam iron.  There is no fountain in the house.  No pet birds no hamsters of pet gerbils.  Does not use feather pillow or duvet.  There is no mold in the [facility] duct.  Does not play musical instruments.  Does not do any gardening.   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  She is also been on colchicine she says.  She is been on prednisone on and off since 2019.  Also Dilantin according to history.  IMPRESSION: HRCT 1. Spectrum of findings compatible with basilar predominant fibrotic interstitial lung disease without frank honeycombing. Slight worsening of traction bronchiectasis since 09/24/2018 high-resolution chest CT. 2. Stable mild cardiomegaly and moderate pericardial effusion. Findings are categorized as probable UIP per consensus guidelines: Diagnosis of Idiopathic Pulmonary Fibrosis: An Official ATS/ERS/JRS/ALAT Clinical Practice Guideline. [place], Iss 5, [phone], Apr 13 2017. 3. Three-vessel coronary atherosclerosis. 4. Stable mild mediastinal lymphadenopathy, compatible with benign reactive etiology.  Aortic Atherosclerosis  (ICD10-I70.0).   Electronically Signed   By: Ilona Sorrel M.D.   On: 06/05/2019 16:47  ROS - per HPI    HEART CATH MAy [id number]  LV end diastolic pressure is normal.   RA: 10 mmHg RV: 46/7 mmHg. RVEDP 8 mmHg PA: 48/22 mmHg. Mean PA 33 mmHg PW: 17 mmHg LV 155/7 mmHg. LVEDP 10 mmHg <10 mmHg LV-Ao pullback gradient CO 7.8 L/min. CI 4 L/min/m2  Simultaneous RV-LV pressure tracings do not show interdependence to suggest constriction physiology.  LM: Wall calcium with no significant stenosis LAD: Ostial-proximal 90% stenosis. Normal diagonal branches. Successful PTCA and stent placement pLAD Synergy DES 3.5X16 mm DES Post dilatation with 4.0 X 8 mm Boling balloon. 0% residual stenosis Ramus: Normal LCx: Normal RCA: Normal  Recommendation: Aspirin/plavix/Eliquis for 1 month. Then stop Aspirin, continue eliquis and plavix for 6 months.  Patients medical complicities remain complex. She still needs aggressive management for her lupus and interstitial lung  disease. Should she need lung biopsy in the near future, plavix could potentially be stopped after 3 months use ie in 03/2019.  [name], MD [facility] Pager: [phone] Office: [phone] If no answer Cell [phone]   has a past medical history of Abnormal Pap smear of cervix, Allergy, Anemia, Atrial fibrillation ([place]), CHF (congestive heart failure) ([place]), Depression, Enlarged heart, Fibroid, Gestational diabetes, Heart murmur, Hypertension, Infection, and Lupus ([place]) (1999).   reports that she has never smoked. She has never used smokeless tobacco.  OV 08/19/2019  Subjective:  Patient ID: [name], female , DOB: 03-08-1976 , age 22 y.o. , MRN: [id number] , ADDRESS: [address] [place] [address]   08/19/2019 -   Chief Complaint  Patient presents with  . Follow-up    Pt states she has been doing better since last visit. Pt does have an occ cough which has gotten  better. Pt does become SOB with activities.   Follow-up progressive interstitial lung disease in the setting of connective tissue disease; systemic lupus erythematosus -started pirfenidone July 09, 2019 Has associated  -Morbid obesity with history of snoring: High pretest probability for sleep apnea -Significant systemic lupus erythematosus: Follows with Dr. Amil Amen but having insurance issues -Pericardial effusion and pulmonary hypertension seen on heart catheterization in spring 2020: Follows with Dr. Virgina Jock on expectant follow-up.  HPI SHAUNETTE GASSNER 44 y.o. -this follow-up visit is to ensure that she is tolerating her pirfenidone quite well.  She started this on July 09 2019 for progressive ILD.  She tells me that at this point in time she is completely off prednisone.  Without the prednisone she is actually feeling better.  Her sugars are better controlled.  She does not have that much fatigue.  She tells me the pirfenidone is overall helping her [this medicines are supposed to make her feel better but is more preventative antifibrotic].  She feels that even her fatigue and dyspnea are better because of the pirfenidone [I do not know about any anti-inflammatory effects against autoimmune disease with this drug but it can function as an anti-inflammatory].  Her dyspnea is better.  She did have some GI side effects early on with the drug but currently none.  She is having liver function test today.  Today she enrolled in the ILD-pro registry study for progressive non-IPF ILD.  She wants to see a different rheumatologist because of insurance issues she is having difficulty establishing again with Dr. Amil Amen.  She has not yet seen a sleep doctor for his suspected sleep apnea.  She has seen cardiology Dr. Virgina Jock for pericardial effusion and pulmonary hypertension.  She is on expectant follow-up.  I reviewed his notes.  It appears she also had a ER visit because of multifocal atrial  tachycardia symptom score and walking desaturation test are documented below.    OV 09/16/2019  Subjective:  Patient ID: [name], female , DOB: September 20, 1975 , age 63 y.o. , MRN: [id number] , ADDRESS: [address] [place] [address] [place] [address]  Follow-up progressive interstitial lung disease in the setting of connective tissue disease; SLE   -started pirfenidone July 09, 2019  - enrolled ILD-Pro registry study Jan 2021 Has associated  -Morbid obesity with history of snoring: High pretest probability for sleep apnea  -Significant systemic lupus erythematosus:   - Follows with Dr. Amil Amen but having insurance issues  - Referred to Dr Estanislado Pandy - [place] Sella 2021.   - On Pllaquenil since 2000  -  On immuran since early 2020  - s.p prednisone 2018 - 2020 intermittnet only - significant side effects +  -Pericardial effusion and pulmonary hypertension seen on heart catheterization in spring 2020:   - Follows with Dr. Virgina Jock on expectant follow-up.  09/16/2019 -telephone visit Chief Complaint  Patient presents with  . televisit    Called and spoke with pt who states she has been doing okay since last visit. Pt states she has had some issues with nausea.     HPI [name] 45 y.o. -on this telephone visit patient identified with 2 person identifier.  Risks, benefits and limitations of telephone visit explained.  She verbalized and agreed to proceed.  This visit main focus is to see how she is doing with the pirfenidone.  She tells me for the last few weeks she has had nausea in the afternoon.  I found out that she is taking her morning dose between 9 and 10 AM and the afternoon dose between noon and 1 PM.  This is very inadequate spacing.  She did not realize she has a space the dosing at least 5 or 6 hours apart.  She was reeducated on this.  Followed up with her on her sleep issues.  She does not have a follow-up sleep consult appointment.  I also referred her to  Dr D  rheumatologist.  However she is here to establish with her.  She is having insurance issues with Dr. Amil Amen.  She has upcoming appointment with Dr. Virgina Jock who cardiologist.  In terms of her lupus she is not on prednisone but continuing Plaquenil and Imuran.  She is really keen to reestablish with a rheumatologist.     OV 10/21/2019  Chief Complaint  Patient presents with  . Televisit    Called and spoke with pt who states she has been doing okay since last visit. States she did see rheumatology yesterday 3/9. Pt states her breathing is now at a stable point as it is doing better since last visit.   Follow-up progressive interstitial lung disease in the setting of connective tissue disease; SLE   -started pirfenidone July 09, 2019  - enrolled ILD-Pro registry study Jan 2021  Has associated  -Morbid obesity with history of snoring: High pretest probability for sleep apnea  -Significant systemic lupus erythematosus:   - Follows with Dr. Amil Amen but having insurance issues  - Referred to Dr Estanislado Pandy - Mary Sella 2021. - not able to see  - On Pllaquenil since 2000  - On immuran since early 2020  - s.p prednisone 2018 - 2020 intermittnet only - significant side effects +  -Pericardial effusion and pulmonary hypertension seen on heart catheterization in spring 2020:   - Follows with Dr. Virgina Jock on expectant follow-up.     OV 10/21/2019 telephone visit.  Patient identified with 2 person identifier.  Chief Complaint  Patient presents with  . Televisit    Called and spoke with pt who states she has been doing okay since last visit. States she did see rheumatology yesterday 3/9. Pt states her breathing is now at a stable point as it is doing better since last visit.    S:  ILD: Main visit is for esbriet tolerance -after spacing out pirfenidone she has no more nausea she is having some mild diarrhea.  Otherwise feeling good.  Dyspnea is much improved as documented below  Pulmonary  hypertension and pericardial effusion: She has upcoming appointment for echocardiogram and follow-up with Dr. Virgina Jock  Lupus:  She has now established with Dr. Kathlene November.  She is on Imuran and Plaquenil.  She will not do prednisone because of the side effects.     OV 04/28/2020   Subjective:  Patient ID: [name], female , DOB: May 17, 1976, age 27 y.o. years. , MRN: [id number],  ADDRESS: [address] [place] [address] PCP  Gildardo Pounds, NP Providers : Treatment Team:  Attending Provider: Brand Males, MD     [name] 44 y.o. -presents for follow-up of her multiple issues particularly ILD.  She has been off prednisone since October 2020.  She was continuing pirfenidone I last had a telephone visit with her in spring 2021.  After that she visited her mom in [place] state in June 2021.  She ended up at [facility] with "fluid in her chest".  At the discharge she was given prednisone taper to off.  She then saw rheumatologist in July 2021 in [place].  Was given another prednisone taper to off.  However she ended up seeing my nurse practitioner in August 2021 with another respiratory flare and she is on a third prednisone taper in 3 months.  This taper ends in a few days.  She says every time she takes prednisone she starts feeling better.  Overall she feels her health is much more stable than June 2021 when she was hospitalized.  She also feels is stable similar to last year.  However ILD symptom questionnaire shows significant worsening of her symptoms compared to even earlier this year.  Pulmonary function test done recently shows decline.  Her last CT scan of the chest was in October 2020.  She is asking if she should be seen by [facility] rheumatology for an opinion.  I have left it to her choice.  She is known to have high risk for sleep apnea.  She missed seeing our sleep doctors here.  She continues on Imuran and Plaquenil   OV  07/26/2020   Subjective:  Patient ID: [name], female , DOB: June 26, 1976, age 67 y.o. years. , MRN: [id number],  ADDRESS: [address] [place] [address] PCP  Gildardo Pounds, NP Providers : Treatment Team:  Attending Provider: Brand Males, MD Patient Care Team: Gildardo Pounds, NP as PCP - General (Nurse Practitioner) [name], MD as Consulting Physician (Cardiology)    Chief Complaint  Patient presents with  . Follow-up    ILD, doing ok      Follow-up progressive interstitial lung disease in the setting of connective tissue disease; SLE   -started pirfenidone July 09, 2019  - enrolled ILD-Pro registry study Jan 2021  Has associated  -Morbid obesity with history of snoring: High pretest probability for sleep apnea  -Significant systemic lupus erythematosus:   - Follows with Dr. Amil Amen but having insurance issues -> Referred to Dr Estanislado Pandy - jan 2021 (not seen) -> but  Established with Dr Kathlene November - s[prig 2021 -> following there and then declined by them due to insurance issues  - On Pllaquenil since 2000  - On immuran since early 2020  - s.p prednisone 2018 - 2020 intermittnet only - significant side effects +  -Pericardial effusion and pulmonary hypertension seen on heart catheterization in spring 2020:   - Follows with Dr. Virgina Jock on expectant follow-up.  Most recent echo October 2021 and stable with moderate pericardial effusion     HPI [name] 44 y.o. -presents for follow-up for the  above issues.  Last seen in September 2021.  After the 2 visits with nurse practitioners.  She is having difficulty with an established rheumatology follow-up.  She is in [place] but they have now told her that there is seeing insurance denials on her visits and therefore they have asked her to see another rheumatologist.  She believes she has a referral with [facility] rheumatology but I do not  see that in the chart.  She is also missed sleep evaluation by our sleep doctors.  This is because she overslept.  In terms of her lupus she continues on Imuran and Plaquenil.  Nurse practitioner placed on 2 courses of prednisone each of this gave her relief in October 2021 and November 2021.  She is now finished a prednisone few days ago.  She does not want to be on it permanently because of the weight gain but she does admit that it helps.  Also November 2021 there was exertional desaturation which is new and she was started on exertional oxygen.  She says she easily desaturates when she does household activities.  In fact when we walked her today she did demonstrate desaturation again as documented below.  Clearly compared to earlier this year is a new finding.  There is concerned that her ILD might be worse.  She had an echo in the last couple of months shows stable moderate pericardial effusion.  In terms of vaccination she is in need of a flu shot.  However she has had a Covid vaccine but not the booster    SYMPTOM SCALE - ILD 10/21/2019  04/28/2020  07/26/2020 230#  O2 use On room air 24/7 ra Using o2 with exertion since nov 2021  Shortness of Breath 0 -> 5 scale with 5 being worst (score 6 If unable to do)    At rest 0 0 0  Simple tasks - showers, clothes change, eating, shaving 1 2 2   Household (dishes, doing bed, laundry) 1 4 0  Shopping 1 2 2   Walking level at own pace 2 2 0  Walking up Stairs 3 4 3   Total (30-36) Dyspnea Score 8 14 7   How bad is your cough? 0 3 0  How bad is your fatigue 1 0 0  How bad is nausea 0 0 0  How bad is vomiting?  0 0 0  How bad is diarrhea? 2 - she thinks is colchicine but onset after esbriet 0 0  How bad is anxiety? 0 0 0  How bad is depression 0 0 0        Simple office walk 185 feet x  3 laps goal with forehead probe 06/11/2019  08/19/2019 esbeitb 04/28/2020 Off esbriet since June 2021 07/26/2020 Back on esbriet  O2 used RA RA    Number  laps completed 3 3    Comments about pace Nl pace Slow pace due to arthralgia    Resting Pulse Ox/HR 98% and 104/min 100% and 102/min 99% and 66/min   Final Pulse Ox/HR 96% and 127/min 98% and 118/min 90% and 92/mn desatrated at 1st lap t0 86% on Room air  Desaturated </= 88% no no    Desaturated <= 3% points no no    Got Tachycardic >/= 90/min yes yes    Symptoms at end of test Very mild dyspnea Mild dyspnea dyspneic   Miscellaneous comments *feeling better       IMPRESSION: 1. Spectrum of findings compatible with basilar predominant  fibrotic interstitial lung disease without frank honeycombing. Slight worsening of traction bronchiectasis since 09/24/2018 high-resolution chest CT. 2. Stable mild cardiomegaly and moderate pericardial effusion. Findings are categorized as probable UIP per consensus guidelines: Diagnosis of Idiopathic Pulmonary Fibrosis: An Official ATS/ERS/JRS/ALAT Clinical Practice Guideline. [place], Iss 5, [phone], Apr 13 2017. 3. Three-vessel coronary atherosclerosis. 4. Stable mild mediastinal lymphadenopathy, compatible with benign reactive etiology.  Aortic Atherosclerosis (ICD10-I70.0).   Electronically Signed   By: Ilona Sorrel M.D.   On: 06/05/2019 16:47   ROS - per HPI  PFT Results Latest Ref Rng & Units 03/21/2020 05/12/2019  FVC-Pre L 1.47 1.65  FVC-Predicted Pre % 57 63  FVC-Post L - 1.69  FVC-Predicted Post % - 65  Pre FEV1/FVC % % 92 90  Post FEV1/FCV % % - 91  FEV1-Pre L 1.36 1.48  FEV1-Predicted Pre % 64 69  FEV1-Post L - 1.54  DLCO uncorrected ml/min/mmHg 8.68 10.98  DLCO UNC% % 46 58  DLCO corrected ml/min/mmHg 8.68 -  DLCO COR %Predicted % 46 -  DLVA Predicted % 80 93  TLC L - 2.72  TLC % Predicted % - 61  RV % Predicted % - 72    ECHO OCt 2021  IMPRESSIONS    1. Left ventricular ejection fraction, by estimation, is 55 to 60%. The  left ventricle has normal function. The left ventricle has  no regional  wall motion abnormalities. There is mild left ventricular hypertrophy.  Left ventricular diastolic parameters  are indeterminate.  2. Right ventricular systolic function is mildly reduced. The right  ventricular size is not well visualized.  3. Left atrial size was moderately dilated.  4. Right atrial size was mildly dilated.  5. Moderate pericardial effusion. The pericardial effusion is posterior  to the left ventricle and the left atrium. There is no evidence of cardiac  tamponade.  6. No significant change compared to previous outpatient study in 10/2019   has a past medical history of Abnormal Pap smear of cervix, Allergy, Anemia, Atrial fibrillation ([place]), CHF (congestive heart failure) ([place]), Depression, Dyspnea, Enlarged heart, Fibroid, Gestational diabetes, Heart murmur, Hypertension, ILD (interstitial lung disease) ([place]), Infection, Lupus ([place]) (1999), Lupus ([place]), and Pericarditis.   reports that she has never smoked. She has never used smokeless tobacco.  Past Surgical History:  Procedure Laterality Date  . A-FLUTTER ABLATION N/A 06/10/2018   Procedure: A-FLUTTER ABLATION;  Surgeon: Evans Lance, MD;  Location: [place] [facility];  Service: Cardiovascular;  Laterality: N/A;  . CARDIAC CATHETERIZATION    . CORONARY STENT INTERVENTION N/A 12/12/2018   Procedure: CORONARY STENT INTERVENTION;  Surgeon: [name], MD;  Location: [place] [facility];  Service: Cardiovascular;  Laterality: N/A;  lad   . RIGHT/LEFT HEART CATH AND CORONARY ANGIOGRAPHY N/A 03/14/2018   Procedure: RIGHT/LEFT HEART CATH AND CORONARY ANGIOGRAPHY;  Surgeon: [name], MD;  Location: [place];  Service: Cardiovascular;  Laterality: N/A;  . RIGHT/LEFT HEART CATH AND CORONARY ANGIOGRAPHY N/A 12/12/2018   Procedure: RIGHT/LEFT HEART CATH AND CORONARY ANGIOGRAPHY;  Surgeon: [name], MD;  Location: [place] [facility];  Service: Cardiovascular;  Laterality:  N/A;  . THERAPEUTIC ABORTION      Allergies  Allergen Reactions  . Septra [Bactrim] Hives  . Sulfamethoxazole-Trimethoprim Hives         Immunization History  Administered Date(s) Administered  . Influenza,inj,Quad PF,6+ Mos 05/25/2013, 05/14/2018, 05/13/2019  . PFIZER SARS-COV-2 Vaccination 11/20/2019,  12/15/2019  . Pneumococcal Polysaccharide-23 05/14/2018  . Tdap 07/09/2018    Family History  Problem Relation Age of Onset  . Cancer Maternal Grandfather        lung  . Diabetes Mother   . Hypertension Mother   . Hypertension Maternal Grandmother   . Diabetes Father   . Hearing loss Neg Hx      Current Outpatient Medications:  .  acetaminophen (TYLENOL) 500 MG tablet, Take 1,000 mg by mouth every 6 (six) hours as needed for headache (pain)., Disp: , Rfl:  .  albuterol (VENTOLIN HFA) 108 (90 Base) MCG/ACT inhaler, Inhale 2 puffs into the lungs every 6 (six) hours as needed for wheezing or shortness of breath., Disp: 8 g, Rfl: 5 .  aspirin EC 81 MG tablet, Take 1 tablet (81 mg total) by mouth daily. Swallow whole., Disp: 90 tablet, Rfl: 3 .  azaTHIOprine (IMURAN) 50 MG tablet, Take 50 mg by mouth 2 (two) times daily., Disp: , Rfl:  .  Blood Glucose Monitoring Suppl (ONETOUCH VERIO) w/Device KIT, 1 each by Does not apply route 3 (three) times daily., Disp: 1 kit, Rfl: 0 .  colchicine 0.6 MG tablet, Take 1 tablet (0.6 mg total) by mouth daily., Disp: 60 tablet, Rfl: 1 .  Continuous Blood Gluc Receiver (FREESTYLE LIBRE 2 READER) DEVI, Use as instructed. Check blood glucose levels five times per day per day. E11.65 Z91.89, Disp: 1 each, Rfl: 1 .  Continuous Blood Gluc Sensor (FREESTYLE LIBRE 2 SENSOR) MISC, Use as instructed. Check blood glucose levels 8 times per day. E11.65 Z91.89, Disp: 2 each, Rfl: 6 .  diltiazem (CARDIZEM CD) 240 MG 24 hr capsule, TAKE 1 CAPSULE BY MOUTH EVERY DAY (Patient taking differently: Take 240 mg by mouth daily.), Disp: 90 capsule, Rfl: 2 .   Dulaglutide (TRULICITY) 3 PJ/0.3PR SOPN, Inject 3 mg as directed once a week., Disp: 6 mL, Rfl: 0 .  ELIQUIS 5 MG TABS tablet, Take 1 tablet by mouth twice daily, Disp: 180 tablet, Rfl: 0 .  furosemide (LASIX) 40 MG tablet, Take 1 tablet by mouth twice daily, Disp: 30 tablet, Rfl: 0 .  glucose blood (ACCU-CHEK GUIDE) test strip, Use as instructed to check blood sugar twice daily. E11.65, Disp: 100 each, Rfl: 6 .  guaiFENesin (MUCINEX) 600 MG 12 hr tablet, Take 1,200 mg by mouth every 12 (twelve) hours as needed for cough or to loosen phlegm., Disp: , Rfl:  .  hydroxychloroquine (PLAQUENIL) 200 MG tablet, Take 1 tablet (200 mg total) by mouth 2 (two) times daily., Disp: 60 tablet, Rfl: 3 .  Insulin Glargine (BASAGLAR KWIKPEN) 100 UNIT/ML, Inject 30 Units into the skin daily. E11.65. may increase by 2 units every 3 days for fasting blood glucose readings greater than 130., Disp: 6 mL, Rfl: 6 .  insulin lispro (HUMALOG KWIKPEN) 100 UNIT/ML KwikPen, Inject 8 Units into the skin 3 (three) times daily. For blood sugars 0-150 give 0 units of insulin, 151-200 give 2 units of insulin, 201-250 give 4 units, 251-300 give 6 units, 301-350 give 8 units, 351-400 give 10 units,> 400 give 12 units and call office. Discussed hypoglycemia protocol., Disp: 21.6 mL, Rfl: 1 .  Insulin Pen Needle (TRUEPLUS PEN NEEDLES) 32G X 4 MM MISC, Use as directed to inject insulin 5x daily, Disp: 200 each, Rfl: 3 .  Lancet Devices (ONE TOUCH DELICA LANCING DEV) MISC, 1 each by Does not apply route 3 (three) times daily., Disp: 1 each, Rfl: 1 .  losartan (COZAAR) 50 MG tablet, TAKE 1 TABLET BY MOUTH EVERY DAY (Patient taking differently: Take 50 mg by mouth daily.), Disp: 90 tablet, Rfl: 3 .  medroxyPROGESTERone (DEPO-PROVERA) 150 MG/ML injection, Inject 150 mg into the muscle every 3 (three) months., Disp: , Rfl:  .  OneTouch Delica Lancets 53I MISC, 1 each by Does not apply route 3 (three) times daily., Disp: 100 each, Rfl: 12 .   Pirfenidone (ESBRIET) 267 MG CAPS, Take 3 capsules by mouth in the morning, at noon, and at bedtime., Disp: 270 capsule, Rfl: 5 .  predniSONE (DELTASONE) 20 MG tablet, Take 1 tablet (20 mg total) by mouth daily with breakfast., Disp: 30 tablet, Rfl: 0 .  rosuvastatin (CRESTOR) 20 MG tablet, TAKE 1 TABLET BY MOUTH EVERY DAY AT 6PM (Patient taking differently: Take 20 mg by mouth daily at 6 PM. TAKE 1 TABLET BY MOUTH EVERY DAY AT 6PM), Disp: 90 tablet, Rfl: 1 .  ESBRIET 267 MG CAPS, Take 1 capsule by mouth in the morning, at noon, and at bedtime for 7 days, THEN 2 capsules in the morning, at noon, and at bedtime for 7 days, THEN 3 capsules in the morning, at noon, and at bedtime for 7 days., Disp: 207 capsule, Rfl: 0 .  metoprolol tartrate (LOPRESSOR) 100 MG tablet, Take 1 tablet (100 mg total) by mouth 2 (two) times daily., Disp: 180 tablet, Rfl: 3      Objective:   Vitals:   07/26/20 0910  BP: 138/88  Pulse: (!) 120  Temp: (!) 97.5 F (36.4 C)  TempSrc: Oral  SpO2: (!) 88%  Weight: 230 lb (104.3 kg)  Height: 5' (1.524 m)    Estimated body mass index is 44.92 kg/m as calculated from the following:   Height as of this encounter: 5' (1.524 m).   Weight as of this encounter: 230 lb (104.3 kg).  @WEIGHTCHANGE @  Filed Weights   07/26/20 0910  Weight: 230 lb (104.3 kg)     Physical Exam   General: No distress. obes3 Neuro: Alert and Oriented x 3. GCS 15. Speech normal Psych: Pleasant Resp:  Barrel Chest - no.  Wheeze - no, Crackles - mild base, No overt respiratory distress CVS: Normal heart sounds. Murmurs - no Ext: Stigmata of Connective Tissue Disease - yes sle, mild erythematous face and pale fingers HEENT: Normal upper airway. PEERL +. No post nasal drip        Assessment:       ICD-10-CM   1. Interstitial lung disease due to connective tissue disease ([place])  J84.89    M35.9   2. Encounter for therapeutic drug monitoring  Z51.81   3. High risk medication use   Z79.899   4. Snoring  R06.83   5. Pericardial effusion  I31.3   6. Vaccine counseling  Z71.85        Plan:     Patient Instructions  No diagnosis found.   Interstitial lung disease due to connective tissue disease ([place]) Therapeutic drug monitoring High risk prescription Immunosuppressed status  -My concern is that the pulmonary fibrosis might be getting worse evidenced by exertional o2 need since Nov 2021 - Glad you are back on esbriet - Noted esbriet causing mild heart burn  Plan  - continue exertional o2  -Continue esbriet per schedule  - -Ensure you apply sunscreen at all times when going out  -  for diarrhea take Imodium as needed   -We will do a fresh prescription as well  =  take OTC pepcid daily  - check cbc, bmet, lft 07/26/2020 -Do high-resolution CT chest in 4-8 weeks supine and prone - Do spirometry and dlco next 4-8 weeks   Snoring Morbid obesity with BMI of 45.0-49.9, adult (HCC)  -too bad you missed your sleep doc appt by oversleeping   Plan -RE_Refer Dr. Elsworth Soho or Dr. Leretha Pol in our office for undiagnosed sleep apnea -Ultimately will have to figure out a weight loss plan  Lupus ([place])   -Glad you are feeling better with intermittent prednisone but concern is daily prednisone causes side  effect - Glad plaquenil and immuran is helping  - Tooo bad practices are not seeing you because of insurane  Plan  -refer Dr Ignacia Marvel [facility] Rheumatology -0> if they wont see then can refer to [facility]    Pericardial effusion  -Is described as moderate on the CT scan in October 2020. Same as feb 2020 and similar oct 2021 without change.    Plan  - per Dr Virgina Jock who is monitoring it  Pulmonary hypertension -seen on right heart catheterization May 2020 but associated with high wedge pressure  --This was seen in the heart catheterization in May 2020 and was at that time because of excess fluid in the body  Plan  - Right heart cath at some point per Dr  Virgina Jock  VAccine counseling   - records show you have not have had flu shot or covid booster  this season  Plan - covid vaccine booster ASAP  - flu shot 07/26/2020   Follow-up -Return to see Dr. Chase Caller in 4-8 weeks and a 30-minute slot but after high-resolution CT chest -     SIGNATURE    Dr. Brand Males, M.D., F.C.C.P,  Pulmonary and Critical Care Medicine Staff Physician, Page Director - Interstitial Lung Disease  Program  [facility] [place], [place], [address]  Pager: [phone], If no answer or between  15:00h - 7:00h: call [phone] Telephone: [phone]  9:42 AM 07/26/2020

## 2020-07-30 ENCOUNTER — Other Ambulatory Visit: Payer: Self-pay | Admitting: Cardiology

## 2020-08-04 ENCOUNTER — Ambulatory Visit: Payer: 59 | Admitting: Family Medicine

## 2020-08-11 ENCOUNTER — Other Ambulatory Visit: Payer: Self-pay | Admitting: Cardiology

## 2020-08-15 ENCOUNTER — Telehealth: Payer: Self-pay | Admitting: Internal Medicine

## 2020-08-15 MED ORDER — PREDNISONE 10 MG PO TABS: ORAL_TABLET | ORAL | 0 refills | Status: AC

## 2020-08-15 NOTE — Telephone Encounter (Signed)
Patient was recently seen by Dr, Marchelle Gearing on 07/26/20. She has been off prednisone for the last month. She continue to take Esbriet. During that visit he felt her pulmonary fibrosis may be getting worse. I am ok with sending in prednisone taper for acute exacerbation as she follows closely with [place]. Keep apt with him on 09/22/19.

## 2020-08-15 NOTE — Telephone Encounter (Signed)
I spoke with her, ok to close encounter

## 2020-08-15 NOTE — Telephone Encounter (Signed)
Primary Pulmonologist: MR Last office visit and with whom: 07/26/20 with MR What do we see them for (pulmonary problems): IPF Last OV assessment/plan:  Interstitial lung disease due to connective tissue disease ([place]) Therapeutic drug monitoring High risk prescription Immunosuppressed status  -My concern is that the pulmonary fibrosis might be getting worse evidenced by exertional o2 need since Nov 2021 - Glad you are back on esbriet - Noted esbriet causing mild heart burn  Plan  - continue exertional o2  -Continue esbriet per schedule             - -Ensure you apply sunscreen at all times when going out             -  for diarrhea take Imodium as needed              -We will do a fresh prescription as well             = take OTC pepcid daily  - check cbc, bmet, lft 07/26/2020 -Do high-resolution CT chest in 4-8 weeks supine and prone - Do spirometry and dlco next 4-8 weeks   Snoring Morbid obesity with BMI of 45.0-49.9, adult (HCC)  -too bad you missed your sleep doc appt by oversleeping   Plan -RE_Refer Dr. Elsworth Soho or Dr. Leretha Pol in our office for undiagnosed sleep apnea -Ultimately will have to figure out a weight loss plan  Lupus ([place])   -Glad you are feeling better with intermittent prednisone but concern is daily prednisone causes side  effect - Glad plaquenil and immuran is helping  - Tooo bad practices are not seeing you because of insurane  Plan  -refer Dr Ignacia Marvel [facility] Rheumatology -0> if they wont see then can refer to [facility]    Pericardial effusion  -Is described as moderate on the CT scan in October 2020. Same as feb 2020 and similar oct 2021 without change.    Plan  - per Dr Virgina Jock who is monitoring it  Pulmonary hypertension -seen on right heart catheterization May 2020 but associated with high wedge pressure  --This was seen in the heart catheterization in May 2020 and was at that time because of excess fluid in the  body  Plan  - Right heart cath at some point per Dr Virgina Jock  VAccine counseling   - records show you have not have had flu shot or covid booster  this season  Plan - covid vaccine booster ASAP  - flu shot 07/26/2020   Follow-up -Return to see Dr. Chase Caller in 4-8 weeks and a 30-minute slot but after high-resolution CT chest -   Was appointment offered to patient (explain)?  Patient wanted recommendations first   Reason for call: Spoke with patient. She stated that she has noticed that she has been more SOB for the past few days. She is currently on 3L of O2. She is fine when sitting but when she gets up and starts to move around, the SOB will increase. She has been checking her O2 at home and it has been ranging from 90-95%.   She also has a cough that is productive at times with clear phlegm.   Denies any fevers or body aches. She has been wheezing more. She has received her covid vaccines and denies being around who has been sick lately.   She is requesting a prednisone taper.   Pharmacy is [facility] on [address].   (examples of things to ask: : When did symptoms start? Fever?  Cough? Productive? Color to sputum? More sputum than usual? Wheezing? Have you needed increased oxygen? Are you taking your respiratory medications? What over the counter measures have you tried?)  Allergies  Allergen Reactions  . Septra [Bactrim] Hives  . Sulfamethoxazole-Trimethoprim Hives         Immunization History  Administered Date(s) Administered  . Influenza,inj,Quad PF,6+ Mos 05/25/2013, 05/14/2018, 05/13/2019, 07/26/2020  . PFIZER SARS-COV-2 Vaccination 11/20/2019, 12/15/2019  . Pneumococcal Polysaccharide-23 05/14/2018  . Tdap 07/09/2018   Beth, can you please advise? MR is out of the office and patient has seen you before. Thanks!

## 2020-08-16 ENCOUNTER — Other Ambulatory Visit: Payer: Self-pay | Admitting: Cardiology

## 2020-08-22 ENCOUNTER — Ambulatory Visit (INDEPENDENT_AMBULATORY_CARE_PROVIDER_SITE_OTHER): Payer: Medicaid Other | Admitting: Obstetrics and Gynecology

## 2020-08-22 ENCOUNTER — Other Ambulatory Visit (HOSPITAL_COMMUNITY)
Admission: RE | Admit: 2020-08-22 | Discharge: 2020-08-22 | Disposition: A | Discharge status: Home / Self Care | Payer: Medicaid Other | Source: Ambulatory Visit | Attending: Family Medicine | Admitting: Family Medicine

## 2020-08-22 ENCOUNTER — Encounter: Payer: Self-pay | Admitting: Obstetrics and Gynecology

## 2020-08-22 ENCOUNTER — Other Ambulatory Visit: Payer: Self-pay

## 2020-08-22 VITALS — BP 160/117 | HR 86 | Ht 60.0 in | Wt 228.6 lb

## 2020-08-22 DIAGNOSIS — N898 Other specified noninflammatory disorders of vagina: Secondary | ICD-10-CM | POA: Insufficient documentation

## 2020-08-22 DIAGNOSIS — Z01411 Encounter for gynecological examination (general) (routine) with abnormal findings: Secondary | ICD-10-CM

## 2020-08-22 DIAGNOSIS — R87810 Cervical high risk human papillomavirus (HPV) DNA test positive: Secondary | ICD-10-CM | POA: Diagnosis not present

## 2020-08-22 DIAGNOSIS — Z01419 Encounter for gynecological examination (general) (routine) without abnormal findings: Secondary | ICD-10-CM

## 2020-08-22 DIAGNOSIS — R87612 Low grade squamous intraepithelial lesion on cytologic smear of cervix (LGSIL): Secondary | ICD-10-CM

## 2020-08-22 DIAGNOSIS — N760 Acute vaginitis: Secondary | ICD-10-CM | POA: Diagnosis not present

## 2020-08-22 DIAGNOSIS — B9689 Other specified bacterial agents as the cause of diseases classified elsewhere: Secondary | ICD-10-CM

## 2020-08-22 DIAGNOSIS — Z3042 Encounter for surveillance of injectable contraceptive: Secondary | ICD-10-CM

## 2020-08-22 MED ORDER — MEDROXYPROGESTERONE ACETATE 150 MG/ML IM SUSP
150.0000 mg | Freq: Once | INTRAMUSCULAR | Status: AC
  Administered 2020-08-22: 150 mg via INTRAMUSCULAR

## 2020-08-22 NOTE — Progress Notes (Signed)
GYNECOLOGY ANNUAL PREVENTATIVE CARE ENCOUNTER NOTE  History:     [name] is a 45 y.o. [id number] female here for a routine annual gynecologic exam.  Current complaints: no gyn complaints.   Denies abnormal vaginal bleeding, discharge, pelvic pain, problems with intercourse or other gynecologic concerns.   Pt has multiple medical problems including atrial fib, chronic lung disease on oxygen, lupus, type 2 diabetes among others.  Pt also presents for her depo provera injection which has kept her amenorrheic.   Gynecologic History No LMP recorded. Patient has had an injection. Contraception: Depo-Provera injections Last Pap: 07/09/2018. Results were: ASCUS with positive HPV  Last mammogram: no results found in Epic  Obstetric History OB History  Gravida Para Term Preterm AB Living  5 2 0 2 3 2   SAB IAB Ectopic Multiple Live Births  2 1 0 0 2    # Outcome Date GA Lbr Len/2nd Weight Sex Delivery Anes PTL Lv  5 SAB           4 SAB  [redacted]w[redacted]d  M         Birth Comments: iufd  3 IAB           2 Preterm  [id number]     Y LIV  1 Preterm  [id number] 6 lb 3 oz (2.807 kg) M Vag-Spont None Y LIV    Past Medical History:  Diagnosis Date  . Abnormal Pap smear of cervix    colpo, HPV  . Allergy   . Anemia   . Atrial fibrillation ([place] . CHF (congestive heart failure) ([place] . Depression    after losses  . Dyspnea   . Enlarged heart    managed by cardiology  . Fibroid   . Gestational diabetes   . Heart murmur   . Hypertension   . ILD (interstitial lung disease) ([place] . Infection    UTI  . Lupus ([place]  . Lupus ([place] . Pericarditis     Past Surgical History:  Procedure Laterality Date  . A-FLUTTER ABLATION N/A 06/10/2018   Procedure: A-FLUTTER ABLATION;  Surgeon: TayEvans LanceD;  Location: [facility];  Service: Cardiovascular;  Laterality: N/A;  . CARDIAC CATHETERIZATION    . CORONARY STENT INTERVENTION N/A 12/12/2018   Procedure: CORONARY STENT  INTERVENTION;  Surgeon: PatNigel MormonD;  Location: [facility];  Service: Cardiovascular;  Laterality: N/A;  lad   . RIGHT/LEFT HEART CATH AND CORONARY ANGIOGRAPHY N/A 03/14/2018   Procedure: RIGHT/LEFT HEART CATH AND CORONARY ANGIOGRAPHY;  Surgeon: PatNigel MormonD;  Location: [facility];  Service: Cardiovascular;  Laterality: N/A;  . RIGHT/LEFT HEART CATH AND CORONARY ANGIOGRAPHY N/A 12/12/2018   Procedure: RIGHT/LEFT HEART CATH AND CORONARY ANGIOGRAPHY;  Surgeon: PatNigel MormonD;  Location: [facility];  Service: Cardiovascular;  Laterality: N/A;  . THERAPEUTIC ABORTION      Current Outpatient Medications on File Prior to Visit  Medication Sig Dispense Refill  . acetaminophen (TYLENOL) 500 MG tablet Take 1,000 mg by mouth every 6 (six) hours as needed for headache (pain).    . aMarland Kitchenbuterol (VENTOLIN HFA) 108 (90 Base) MCG/ACT inhaler Inhale 2 puffs into the lungs every 6 (six) hours as needed for wheezing or shortness of breath. 8 g 5  . aspirin EC 81 MG tablet Take 1 tablet (81 mg total) by mouth daily. Swallow whole. 90 tablet  3  . azaTHIOprine (IMURAN) 50 MG tablet Take 50 mg by mouth 2 (two) times daily.    . Blood Glucose Monitoring Suppl (ONETOUCH VERIO) w/Device KIT 1 each by Does not apply route 3 (three) times daily. 1 kit 0  . colchicine 0.6 MG tablet Take 1 tablet (0.6 mg total) by mouth daily. 60 tablet 1  . Continuous Blood Gluc Receiver (FREESTYLE LIBRE 2 READER) DEVI Use as instructed. Check blood glucose levels five times per day per day. E11.65 Z91.89 1 each 1  . Continuous Blood Gluc Sensor (FREESTYLE LIBRE 2 SENSOR) MISC Use as instructed. Check blood glucose levels 8 times per day. E11.65 Z91.89 2 each 6  . diltiazem (CARDIZEM CD) 240 MG 24 hr capsule TAKE 1 CAPSULE BY MOUTH EVERY DAY (Patient taking differently: Take 240 mg by mouth daily.) 90 capsule 2  . Dulaglutide (TRULICITY) 3 XB/1.4NW SOPN Inject 3 mg as directed once a week.  6 mL 0  . ELIQUIS 5 MG TABS tablet Take 1 tablet by mouth twice daily 180 tablet 0  . ESBRIET 267 MG CAPS Take 1 capsule by mouth in the morning, at noon, and at bedtime for 7 days, THEN 2 capsules in the morning, at noon, and at bedtime for 7 days, THEN 3 capsules in the morning, at noon, and at bedtime for 7 days. 207 capsule 0  . furosemide (LASIX) 40 MG tablet Take 1 tablet by mouth twice daily 30 tablet 0  . glucose blood (ACCU-CHEK GUIDE) test strip Use as instructed to check blood sugar twice daily. E11.65 100 each 6  . guaiFENesin (MUCINEX) 600 MG 12 hr tablet Take 1,200 mg by mouth every 12 (twelve) hours as needed for cough or to loosen phlegm.    . hydroxychloroquine (PLAQUENIL) 200 MG tablet Take 1 tablet (200 mg total) by mouth 2 (two) times daily. 60 tablet 3  . Insulin Glargine (BASAGLAR KWIKPEN) 100 UNIT/ML Inject 30 Units into the skin daily. E11.65. may increase by 2 units every 3 days for fasting blood glucose readings greater than 130. 6 mL 6  . insulin lispro (HUMALOG KWIKPEN) 100 UNIT/ML KwikPen Inject 8 Units into the skin 3 (three) times daily. For blood sugars 0-150 give 0 units of insulin, 151-200 give 2 units of insulin, 201-250 give 4 units, 251-300 give 6 units, 301-350 give 8 units, 351-400 give 10 units,> 400 give 12 units and call office. Discussed hypoglycemia protocol. 21.6 mL 1  . Insulin Pen Needle (TRUEPLUS PEN NEEDLES) 32G X 4 MM MISC Use as directed to inject insulin 5x daily 200 each 3  . Lancet Devices (ONE TOUCH DELICA LANCING DEV) MISC 1 each by Does not apply route 3 (three) times daily. 1 each 1  . losartan (COZAAR) 50 MG tablet TAKE 1 TABLET BY MOUTH EVERY DAY (Patient taking differently: Take 50 mg by mouth daily.) 90 tablet 3  . medroxyPROGESTERone (DEPO-PROVERA) 150 MG/ML injection Inject 150 mg into the muscle every 3 (three) months.    . metoprolol tartrate (LOPRESSOR) 100 MG tablet Take 1 tablet (100 mg total) by mouth 2 (two) times daily. 180 tablet 3   . metoprolol tartrate (LOPRESSOR) 50 MG tablet Take 1 tablet by mouth twice daily 180 tablet 0  . OneTouch Delica Lancets 29F MISC 1 each by Does not apply route 3 (three) times daily. 100 each 12  . Pirfenidone (ESBRIET) 267 MG CAPS Take 3 capsules by mouth in the morning, at noon, and at bedtime. 270 capsule 5  .  predniSONE (DELTASONE) 10 MG tablet Take 4 tabs po daily x 3 days; then 3 tabs daily x3 days; then 2 tabs daily x3 days; then 1 tab daily x 3 days; then stop 30 tablet 0  . rosuvastatin (CRESTOR) 20 MG tablet TAKE 1 TABLET BY MOUTH EVERY DAY AT 6PM (Patient taking differently: Take 20 mg by mouth daily at 6 PM. TAKE 1 TABLET BY MOUTH EVERY DAY AT 6PM) 90 tablet 1  . [DISCONTINUED] insulin aspart (NOVOLOG FLEXPEN) 100 UNIT/ML FlexPen For blood sugars 0-150 give 0 units of insulin, 151-200 give 2 units of insulin, 201-250 give 4 units, 251-300 give 6 units, 301-350 give 8 units, 351-400 give 10 units,> 400 give 12 units and call M.D. Discussed hypoglycemia protocol. 15 mL 3   No current facility-administered medications on file prior to visit.    Allergies  Allergen Reactions  . Septra [Bactrim] Hives  . Sulfamethoxazole-Trimethoprim Hives         Social History:  reports that she has never smoked. She has never used smokeless tobacco. She reports current alcohol use. She reports that she does not use drugs.  Family History  Problem Relation Age of Onset  . Cancer Maternal Grandfather        lung  . Diabetes Mother   . Hypertension Mother   . Hypertension Maternal Grandmother   . Diabetes Father   . Hearing loss Neg Hx     The following portions of the patient's history were reviewed and updated as appropriate: allergies, current medications, past family history, past medical history, past social history, past surgical history and problem list.  Review of Systems Pertinent items noted in HPI and remainder of comprehensive ROS otherwise negative.  Physical Exam:  BP (!)  160/117 (BP Location: Right Arm)   Pulse 86   Ht 5' (1.524 m)   Wt 228 lb 9.6 oz (103.7 kg)   BMI 44.65 kg/m  CONSTITUTIONAL: Well-developed, obese female in no acute distress. She is using oxygen currently HENT:  Normocephalic, atraumatic, External right and left ear normal. Oropharynx is clear and moist EYES: Conjunctivae and EOM are normal.  NECK: Normal range of motion, supple, no masses.  Normal thyroid.  SKIN: Skin is warm and dry. No rash noted. Not diaphoretic. No erythema. No pallor. MUSCULOSKELETAL: Normal range of motion. No tenderness.  No cyanosis, clubbing, or edema.  2+ distal pulses. NEUROLOGIC: Alert and oriented to person, place, and time. Normal reflexes, muscle tone coordination.  PSYCHIATRIC: Normal mood and affect. Normal behavior. Normal judgment and thought content. CARDIOVASCULAR: Normal heart rate noted, irregular rhythm RESPIRATORY: Clear to auscultation bilaterally. Effort and breath sounds normal, no problems with respiration noted. BREASTS: deferred ABDOMEN: Soft, no distention noted.  No tenderness, rebound or guarding.  PELVIC: Normal appearing external genitalia and urethral meatus; normal appearing vaginal mucosa and cervix.  No abnormal discharge noted.  Pap smear obtained.  Normal uterine size, no other palpable masses, no uterine or adnexal tenderness.  Performed in the presence of a chaperone. 4-5 ulcerated lesions around the clitoral area.  Normal appearing discharge, but pt notes irritation and itching   Assessment and Plan:    1. Depo-Provera contraceptive status Administered today, amenorrhea continues  2. Encounter for annual routine gynecological examination Pap pending  3. Vaginal discharge  - Cervicovaginal ancillary only( [place])  4. Vaginal lesion  - Herpes simplex virus culture  Will follow up results of pap smear and manage accordingly. Mammogram will be rescheduled by the patient Routine  preventative health maintenance  measures emphasized. Please refer to After Visit Summary for other counseling recommendations.      Lynnda Shields, MD, [place] for [facility], [place]

## 2020-08-22 NOTE — Patient Instructions (Signed)
Medroxyprogesterone injection [Contraceptive] What is this medicine? MEDROXYPROGESTERONE (me DROX ee proe JES te rone) contraceptive injections prevent pregnancy. They provide effective birth control for 3 months. Depo-SubQ Provera 104 injection is also used for treating pain related to endometriosis. This medicine may be used for other purposes; ask your health care provider or pharmacist if you have questions. COMMON BRAND NAME(S): Depo-Provera, Depo-subQ Provera 104 What should I tell my health care provider before I take this medicine? They need to know if you have any of these conditions:  asthma  blood clots  breast cancer or family history of breast cancer  depression  diabetes  eating disorder (anorexia nervosa)  heart attack  high blood pressure  HIV infection or AIDS  if you often drink alcohol  kidney disease  liver disease  migraine headaches  osteoporosis, weak bones  seizures  stroke  tobacco smoker  vaginal bleeding  an unusual or allergic reaction to medroxyprogesterone, other hormones, medicines, foods, dyes, or preservatives  pregnant or trying to get pregnant  breast-feeding How should I use this medicine? Depo-Provera CI contraceptive injection is given into a muscle. Depo-subQ Provera 104 injection is given under the skin. It is given by a health care provider in a hospital or clinic setting. The injection is usually given during the first 5 days after the start of a menstrual period or 6 weeks after delivery of a baby. A patient package insert for the product will be given with each prescription and refill. Be sure to read this information carefully each time. The sheet may change often. Talk to your pediatrician regarding the use of this medicine in children. Special care may be needed. These injections have been used in female children who have started having menstrual periods. Overdosage: If you think you have taken too much of this  medicine contact a poison control center or emergency room at once. NOTE: This medicine is only for you. Do not share this medicine with others. What if I miss a dose? Keep appointments for follow-up doses. You must get an injection once every 3 months. It is important not to miss your dose. Call your health care provider if you are unable to keep an appointment. What may interact with this medicine?  antibiotics or medicines for infections, especially rifampin and griseofulvin  antivirals for HIV or hepatitis  aprepitant  armodafinil  bexarotene  bosentan  medicines for seizures like carbamazepine, felbamate, oxcarbazepine, phenytoin, phenobarbital, primidone, topiramate  mitotane  modafinil  St. John's wort This list may not describe all possible interactions. Give your health care provider a list of all the medicines, herbs, non-prescription drugs, or dietary supplements you use. Also tell them if you smoke, drink alcohol, or use illegal drugs. Some items may interact with your medicine. What should I watch for while using this medicine? This drug does not protect you against HIV infection (AIDS) or other sexually transmitted diseases. Use of this product may cause you to lose calcium from your bones. Loss of calcium may cause weak bones (osteoporosis). Only use this product for more than 2 years if other forms of birth control are not right for you. The longer you use this product for birth control the more likely you will be at risk for weak bones. Ask your health care professional how you can keep strong bones. You may have a change in bleeding pattern or irregular periods. Many females stop having periods while taking this drug. If you have received your injections on time, your   chance of being pregnant is very low. If you think you may be pregnant, see your health care professional as soon as possible. Tell your health care professional if you want to get pregnant within the  next year. The effect of this medicine may last a long time after you get your last injection. What side effects may I notice from receiving this medicine? Side effects that you should report to your doctor or health care professional as soon as possible:  allergic reactions like skin rash, itching or hives, swelling of the face, lips, or tongue  blood clot (chest pain; shortness of breath; pain, swelling, or warmth in the leg)  breast tenderness or discharge  changes in emotions or moods  changes in vision  liver injury (dark yellow or brown urine; general ill feeling or flu-like symptoms; loss of appetite, right upper belly pain; unusually weak or tired, yellowing of the eyes or skin)  persistent pain, pus, or bleeding at the injection site  stroke (changes in vision; confusion; trouble speaking or understanding; severe headaches; sudden numbness or weakness of the face, arm or leg; trouble walking; dizziness; loss of balance or coordination)  trouble breathing Side effects that usually do not require medical attention (report to your doctor or health care professional if they continue or are bothersome):  change in sex drive  dizziness  fluid retention  headache  irregular periods, spotting, or absent periods  pain, redness, or irritation at site where injected  stomach pain  weight gain This list may not describe all possible side effects. Call your doctor for medical advice about side effects. You may report side effects to FDA at 1-800-FDA-1088. Where should I keep my medicine? This injection is only given by a health care provider. It will not be stored at home. NOTE: This sheet is a summary. It may not cover all possible information. If you have questions about this medicine, talk to your doctor, pharmacist, or health care provider.  2021 Elsevier/Gold Standard (2019-09-16 10:29:21)  

## 2020-08-24 LAB — CERVICOVAGINAL ANCILLARY ONLY
Bacterial Vaginitis (gardnerella): POSITIVE — AB
Candida Glabrata: NEGATIVE
Candida Vaginitis: NEGATIVE
Comment: NEGATIVE
Comment: NEGATIVE
Comment: NEGATIVE
Comment: NEGATIVE
Trichomonas: NEGATIVE

## 2020-08-24 LAB — HERPES SIMPLEX VIRUS CULTURE

## 2020-08-25 ENCOUNTER — Telehealth: Payer: Self-pay

## 2020-08-25 MED ORDER — TERCONAZOLE 0.4 % VA CREA: 1.0000 | TOPICAL_CREAM | Freq: Every day | VAGINAL | 0 refills | Status: AC

## 2020-08-25 MED ORDER — METRONIDAZOLE 500 MG PO TABS: 500.0000 mg | ORAL_TABLET | Freq: Two times a day (BID) | ORAL | 0 refills | Status: AC

## 2020-08-25 NOTE — Telephone Encounter (Addendum)
-----   Message from Griffin Basil, MD sent at 08/25/2020  8:30 AM EST ----- Vaginal swab showed bacterial vaginosis, will offer treatment  Notified pt results of BV and that we have sent in a tx of Flagyl that she will take twice a day for seven days to her [facility] [address] pharmacy and something to treat if she was to a yeast infection.  Pt verbalized understanding with no further questions.    Mel Almond, RN  08/25/20

## 2020-08-27 ENCOUNTER — Other Ambulatory Visit: Payer: Self-pay | Admitting: Cardiology

## 2020-08-29 NOTE — Progress Notes (Unsigned)
S:     PCP: Geryl Rankins, NP PMH: SLE (requiring steroid use), HTN, T2DM, HFpEF, CAD s/p pLAD PCI for critical stenosis, Afib, ILD  Patient arrives in good spirits.  Presents for diabetes evaluation, education, and management Patient was referred and last seen by Primary Care Provider on 07/19/20. At that visit, pt ran out of Wimer for weeks and has not been using Humalog SSI. Average BG 270s on prednisone taper. Discussed medication adherence. No medication changes made.   Patient diagnosed with chronic medical conditions (LUPUS and ILD) which require long term steroid use and has poorly controlled diabetes due to the steroids. Also being managed by Cardiology, Pulmonology and Rheumatology.  Today, patient reports ***  -still on prednisone taper? How many days left?  A1C = 11.8% Check Clinic BG Review medications and adherence (timing of meds, etc.)  Ate or drank anything prior to visit today? At home BGs?  Marland Kitchen Highs . Lows  Hyperglycemia sx (nocturia, neuropathy, visual changes, foot exams) Hypoglycemia symptoms (dizziness, shaky, sweating, hungry, confusion) Diet Exercise  Family/Social History:  -Fhx: DM in mother and father, HTN in mother and grandmother, lung cancer in grandfather  Insurance coverage/medication affordability: Medicaid [place] [facility]  Medication adherence reported *** .   Current diabetes medications include: Trulicity 3 mg weekly, Basaglar 30 units daily, Humalog 8 units TID w/meals + SSI Current hypertension medications include: losartan 50 mg daily, Lopressor 100 mg BID, diltiazem 240 mg daily (afib), furosemide 40 mg BID (CHF) Current hyperlipidemia medications include: rosuvastatin 20 mg daily  Patient {Actions; denies-reports:120008} hypoglycemic events.  Patient reported dietary habits: Eats *** meals/day Breakfast:*** Lunch:*** Dinner:*** Snacks:*** Drinks:***  Patient-reported exercise habits: ***   Patient {Actions;  denies-reports:120008} nocturia (nighttime urination).  Patient {Actions; denies-reports:120008} neuropathy (nerve pain). Patient {Actions; denies-reports:120008} visual changes. Patient {Actions; denies-reports:120008} self foot exams.     O:  POCT:   Home fasting blood sugars: ***  2 hour post-meal/random blood sugars: ***.  Lab Results  Component Value Date   HGBA1C 11.8 (H) 06/11/2020   There were no vitals filed for this visit.  Lipid Panel     Component Value Date/Time   CHOL 187 04/19/2020 1431   TRIG 221 (H) 04/19/2020 1431   HDL 72 04/19/2020 1431   CHOLHDL 2.6 04/19/2020 1431   CHOLHDL 4.8 08/18/2008 1746   VLDL 20 08/18/2008 1746   LDLCALC 79 04/19/2020 1431    Clinical Atherosclerotic Cardiovascular Disease (ASCVD): No  The 10-year ASCVD risk score Mikey Bussing DC Jr., et al., 2013) is: 8.2%   Values used to calculate the score:     Age: 45 years     Sex: Female     Is Non-Hispanic African American: Yes     Diabetic: Yes     Tobacco smoker: No     Systolic Blood Pressure: 818 mmHg     Is BP treated: Yes     HDL Cholesterol: 72 mg/dL     Total Cholesterol: 187 mg/dL    A/P: Diabetes longstanding*** currently ***. Patient is *** able to verbalize appropriate hypoglycemia management plan. Medication adherence appears ***. Control is suboptimal due to ***. - *** Basaglar  - *** Humalog -Continued Trulicity 3 mg weekly -Continued metformin 1000 mg BID -Extensively discussed pathophysiology of diabetes, recommended lifestyle interventions, dietary effects on blood sugar control -Counseled on s/sx of and management of hypoglycemia -Next A1C anticipated ***.   ASCVD risk - primary prevention in patient with diabetes. Last LDL is not controlled (  goal <70 mg/dL). ASCVD risk score is not >20%  - moderate or high intensity statin indicated. -Continued rosuvastatin 20 mg daily. Can consider increasing to 40 mg daily to reach LDL goal <70 mg/dL  Written patient  instructions provided.  Total time in face to face counseling *** minutes.   Follow up Pharmacist/PCP*** Clinic Visit in ***.    Lorel Monaco, PharmD, [place] PGY2 Ambulatory Care Resident [place]

## 2020-08-30 ENCOUNTER — Ambulatory Visit: Payer: Medicaid Other | Admitting: Pharmacist

## 2020-08-31 ENCOUNTER — Ambulatory Visit: Payer: Medicaid Other | Admitting: Pharmacist

## 2020-08-31 LAB — CYTOLOGY - PAP
Comment: NEGATIVE
Comment: NEGATIVE
HPV 16: POSITIVE — AB
HPV 18 / 45: POSITIVE — AB
High risk HPV: POSITIVE — AB

## 2020-09-12 ENCOUNTER — Emergency Department (HOSPITAL_COMMUNITY)
Admission: EM | Admit: 2020-09-12 | Discharge: 2020-09-13 | Disposition: E | Payer: Medicaid Other | Attending: Emergency Medicine | Admitting: Emergency Medicine

## 2020-09-12 DIAGNOSIS — I110 Hypertensive heart disease with heart failure: Secondary | ICD-10-CM | POA: Insufficient documentation

## 2020-09-12 DIAGNOSIS — I469 Cardiac arrest, cause unspecified: Secondary | ICD-10-CM | POA: Diagnosis not present

## 2020-09-12 DIAGNOSIS — J9600 Acute respiratory failure, unspecified whether with hypoxia or hypercapnia: Secondary | ICD-10-CM | POA: Diagnosis not present

## 2020-09-12 DIAGNOSIS — R0602 Shortness of breath: Secondary | ICD-10-CM | POA: Diagnosis present

## 2020-09-12 DIAGNOSIS — I509 Heart failure, unspecified: Secondary | ICD-10-CM | POA: Insufficient documentation

## 2020-09-12 MED ORDER — SODIUM BICARBONATE 8.4 % IV SOLN
INTRAVENOUS | Status: AC | PRN
  Administered 2020-09-12: 50 meq via INTRAVENOUS

## 2020-09-13 ENCOUNTER — Institutional Professional Consult (permissible substitution): Payer: Medicaid Other | Admitting: Pulmonary Disease

## 2020-09-13 NOTE — Progress Notes (Signed)
   09/03/2020 0700  Clinical Encounter Type  Visited With Patient and family together  Visit Type Death  Referral From Nurse  Consult/Referral To Chaplain  Spiritual Encounters  Spiritual Needs Emotional;Grief support  The chaplain responded to page for patient death. The chaplain spoke with the husband at bedside. The chaplain provided grief, emotional, and social support while talking to the patient's husband. The chaplain provided the family with a patient placement card. The family will likely take the patient to [place], [place] where they are from. The patient's son and a family friend are also at bedside. The chaplain will follow up as needed

## 2020-09-13 NOTE — Code Documentation (Signed)
Patient time of death occurred at [phone].

## 2020-09-13 NOTE — ED Provider Notes (Signed)
[facility] Emergency Department Provider Note MRN:  [id number]  Arrival date & time: 08/22/2020     Chief Complaint   Cardiac Arrest   History of Present Illness   [name] is a 45 y.o. year-old female with a history of lupus, interstitial lung disease, CHF presenting to the ED with chief complaint of cardiac arrest.  EMS called to patient's house for shortness of breath, which per husband was worsening over the past few days.  Became acutely worse when transitioning patient into the ambulance, became unresponsive and pulseless.  PEA arrest.  I was unable to obtain an accurate HPI, PMH, or ROS due to the patient's cardiac arrest.  Level 5 caveat.  Review of Systems  Positive for shortness of breath, cardiac arrest.  Patient's Health History    Past Medical History:  Diagnosis Date  . Abnormal Pap smear of cervix    colpo, HPV  . Allergy   . Anemia   . Atrial fibrillation ([place])   . CHF (congestive heart failure) ([place])   . Depression    after losses  . Dyspnea   . Enlarged heart    managed by cardiology  . Fibroid   . Gestational diabetes   . Heart murmur   . Hypertension   . ILD (interstitial lung disease) ([place])   . Infection    UTI  . Lupus ([place]) 1999  . Lupus ([place])   . Pericarditis     Past Surgical History:  Procedure Laterality Date  . A-FLUTTER ABLATION N/A 06/10/2018   Procedure: A-FLUTTER ABLATION;  Surgeon: Evans Lance, MD;  Location: [place] [facility];  Service: Cardiovascular;  Laterality: N/A;  . CARDIAC CATHETERIZATION    . CORONARY STENT INTERVENTION N/A 12/12/2018   Procedure: CORONARY STENT INTERVENTION;  Surgeon: [name], MD;  Location: [place] [facility];  Service: Cardiovascular;  Laterality: N/A;  lad   . RIGHT/LEFT HEART CATH AND CORONARY ANGIOGRAPHY N/A 03/14/2018   Procedure: RIGHT/LEFT HEART CATH AND CORONARY ANGIOGRAPHY;  Surgeon: [name], MD;  Location: [place] [facility];   Service: Cardiovascular;  Laterality: N/A;  . RIGHT/LEFT HEART CATH AND CORONARY ANGIOGRAPHY N/A 12/12/2018   Procedure: RIGHT/LEFT HEART CATH AND CORONARY ANGIOGRAPHY;  Surgeon: [name], MD;  Location: [place] [facility];  Service: Cardiovascular;  Laterality: N/A;  . THERAPEUTIC ABORTION      Family History  Problem Relation Age of Onset  . Cancer Maternal Grandfather        lung  . Diabetes Mother   . Hypertension Mother   . Hypertension Maternal Grandmother   . Diabetes Father   . Hearing loss Neg Hx     Social History   Socioeconomic History  . Marital status: Married    Spouse name: Not on file  . Number of children: 2  . Years of education: Not on file  . Highest education level: Not on file  Occupational History  . Not on file  Tobacco Use  . Smoking status: Never Smoker  . Smokeless tobacco: Never Used  Vaping Use  . Vaping Use: Never used  Substance and Sexual Activity  . Alcohol use: Yes    Comment: ocasionally   . Drug use: No  . Sexual activity: Yes    Birth control/protection: Injection  Other Topics Concern  . Not on file  Social History Narrative  . Not on file   Social Determinants of Health   Financial Resource Strain: Not  on file  Food Insecurity: No Food Insecurity  . Worried About Charity fundraiser in the Last Year: Never true  . Ran Out of Food in the Last Year: Never true  Transportation Needs: No Transportation Needs  . Lack of Transportation (Medical): No  . Lack of Transportation (Non-Medical): No  Physical Activity: Not on file  Stress: Not on file  Social Connections: Not on file  Intimate Partner Violence: Not on file     Physical Exam  There were no vitals filed for this visit.  CONSTITUTIONAL: Ill-appearing NEURO: Unresponsive EYES: Fixed pupils ENT/NECK:  no LAD, no JVD CARDIO: Cold, poorly perfused PULM: Apneic, rhonchorous breath sounds, King airway in place GI/GU: Mildly distended MSK/SPINE:  No gross  deformities, no edema SKIN:  no rash, atraumatic PSYCH: Unable to assess  *Additional and/or pertinent findings included in MDM below  Diagnostic and Interventional Summary    EKG Interpretation  Date/Time:    Ventricular Rate:    PR Interval:    QRS Duration:   QT Interval:    QTC Calculation:   R Axis:     Text Interpretation:        Labs Reviewed - No data to display  No orders to display    Medications  sodium bicarbonate injection (50 mEq Intravenous Given 10-12-20 0625)     Procedures  /  Critical Care .Critical Care Performed by: Maudie Flakes, MD Authorized by: Maudie Flakes, MD   Critical care provider statement:    Critical care time (minutes):  60   Critical care was necessary to treat or prevent imminent or life-threatening deterioration of the following conditions: Cardiac arrest.   Critical care was time spent personally by me on the following activities:  Discussions with consultants, evaluation of patient's response to treatment, examination of patient, ordering and performing treatments and interventions, ordering and review of laboratory studies, ordering and review of radiographic studies, pulse oximetry, re-evaluation of patient's condition, obtaining history from patient or surrogate and review of old charts Procedure Name: Intubation Date/Time: 10-12-2020 6:58 AM Performed by: Maudie Flakes, MD Pre-anesthesia Checklist: Patient identified, Patient being monitored, Emergency Drugs available, Timeout performed and Suction available Oxygen Delivery Method: Non-rebreather mask Preoxygenation: Pre-oxygenation with 100% oxygen Induction Type: Rapid sequence Ventilation: Mask ventilation without difficulty Laryngoscope Size: Glidescope and 4 Grade View: Grade II Tube size: 7.5 mm Number of attempts: 2 Airway Equipment and Method: Rigid stylet Placement Confirmation: ETT inserted through vocal cords under direct vision,  CO2 detector and Breath  sounds checked- equal and bilateral Secured at: 23 cm Tube secured with: ETT holder Comments: First attempt with direct laryngoscopy quickly terminated, very poor view due to small mouth, large tongue, anterior larynx.  Second attempt with glidescope without issue.    Ultrasound ED Echo  Date/Time: 2020/10/12 7:03 AM Performed by: Maudie Flakes, MD Authorized by: Maudie Flakes, MD   Procedure details:    Indications: cardiac arrest     Views: apical 4 chamber view     Images: not archived   Findings:    Pericardium: no pericardial effusion     Cardiac Activity: no cardiac activity      ED Course and Medical Decision Making  I have reviewed the triage vital signs, the nursing notes, and pertinent available records from the EMR.  Listed above are laboratory and imaging tests that I personally ordered, reviewed, and interpreted and then considered in my medical decision making (see below for details).  Shortness of breath, suspect flash pulmonary edema while patient was being loaded into the ambulance.  Unresponsive and lost pulses with EMS.  PEA arrest during the entire code event.  Received 6 doses of epinephrine prior to arrival.  [facility] airway in place.  ET tube secured as described above.  CPR continued in the emergency department for 12 minutes.  No return of pulses.  Consistent absence of any cardiac activity on bedside ultrasound.  Total of 1 hour and 5 minutes of CPR.  Given this long duration and lack of cardiac activity, efforts felt to be futile.  Time of death 634am September 12, 2020.       Barth Kirks. Sedonia Small, MD [place] [email or web address]  Final Clinical Impressions(s) / ED Diagnoses     ICD-10-CM   1. Cardiac arrest ([place])  I46.9   2. Acute respiratory failure, unspecified whether with hypoxia or hypercapnia ([place])  J96.00     ED Discharge Orders    None       Discharge Instructions Discussed with and Provided  to Patient:   Discharge Instructions   None       Maudie Flakes, MD 08/20/2020 [phone]

## 2020-09-13 NOTE — ED Notes (Signed)
In consult A

## 2020-09-13 NOTE — ED Notes (Signed)
prentiss [phone] husband

## 2020-09-13 DEATH — deceased

## 2020-09-14 ENCOUNTER — Ambulatory Visit (HOSPITAL_COMMUNITY): Payer: Medicaid Other

## 2020-09-21 ENCOUNTER — Ambulatory Visit: Payer: Medicaid Other | Admitting: Internal Medicine

## 2020-10-11 ENCOUNTER — Other Ambulatory Visit: Payer: 59

## 2020-10-19 ENCOUNTER — Ambulatory Visit: Payer: 59 | Admitting: Cardiology

## 2020-11-07 ENCOUNTER — Ambulatory Visit: Payer: Medicaid Other

## 2020-11-12 ENCOUNTER — Other Ambulatory Visit: Payer: Self-pay

## 8119-12-12 DEATH — deceased
